# Patient Record
Sex: Female | Born: 1945 | Race: Black or African American | Hispanic: No | Smoking: Never smoker
Health system: Southern US, Community
[De-identification: ages and names within clinical notes are randomized; demographics above are authoritative.]

## PROBLEM LIST (undated history)

## (undated) DIAGNOSIS — R519 Headache, unspecified: Secondary | ICD-10-CM

## (undated) DIAGNOSIS — E871 Hypo-osmolality and hyponatremia: Secondary | ICD-10-CM

## (undated) DIAGNOSIS — R5381 Other malaise: Secondary | ICD-10-CM

## (undated) DIAGNOSIS — Z992 Dependence on renal dialysis: Secondary | ICD-10-CM

## (undated) DIAGNOSIS — K8020 Calculus of gallbladder without cholecystitis without obstruction: Secondary | ICD-10-CM

## (undated) DIAGNOSIS — E559 Vitamin D deficiency, unspecified: Secondary | ICD-10-CM

## (undated) DIAGNOSIS — M899 Disorder of bone, unspecified: Secondary | ICD-10-CM

## (undated) DIAGNOSIS — M949 Disorder of cartilage, unspecified: Secondary | ICD-10-CM

## (undated) DIAGNOSIS — R51 Headache: Secondary | ICD-10-CM

## (undated) DIAGNOSIS — E7220 Disorder of urea cycle metabolism, unspecified: Secondary | ICD-10-CM

## (undated) DIAGNOSIS — I10 Essential (primary) hypertension: Secondary | ICD-10-CM

## (undated) DIAGNOSIS — C801 Malignant (primary) neoplasm, unspecified: Secondary | ICD-10-CM

## (undated) DIAGNOSIS — F411 Generalized anxiety disorder: Secondary | ICD-10-CM

## (undated) DIAGNOSIS — N281 Cyst of kidney, acquired: Secondary | ICD-10-CM

## (undated) DIAGNOSIS — K7030 Alcoholic cirrhosis of liver without ascites: Secondary | ICD-10-CM

## (undated) DIAGNOSIS — R634 Abnormal weight loss: Secondary | ICD-10-CM

## (undated) DIAGNOSIS — E785 Hyperlipidemia, unspecified: Secondary | ICD-10-CM

## (undated) DIAGNOSIS — N186 End stage renal disease: Secondary | ICD-10-CM

## (undated) DIAGNOSIS — F1010 Alcohol abuse, uncomplicated: Secondary | ICD-10-CM

## (undated) DIAGNOSIS — M47812 Spondylosis without myelopathy or radiculopathy, cervical region: Secondary | ICD-10-CM

## (undated) DIAGNOSIS — R5383 Other fatigue: Secondary | ICD-10-CM

## (undated) DIAGNOSIS — F329 Major depressive disorder, single episode, unspecified: Secondary | ICD-10-CM

## (undated) HISTORY — DX: Alcoholic cirrhosis of liver without ascites: K70.30

## (undated) HISTORY — DX: Hypo-osmolality and hyponatremia: E87.1

## (undated) HISTORY — DX: Cyst of kidney, acquired: N28.1

## (undated) HISTORY — PX: ABDOMINAL HYSTERECTOMY: SHX81

## (undated) HISTORY — DX: Abnormal weight loss: R63.4

## (undated) HISTORY — DX: Essential (primary) hypertension: I10

## (undated) HISTORY — DX: Generalized anxiety disorder: F41.1

## (undated) HISTORY — DX: Disorder of urea cycle metabolism, unspecified: E72.20

## (undated) HISTORY — DX: Other fatigue: R53.83

## (undated) HISTORY — PX: OTHER SURGICAL HISTORY: SHX169

## (undated) HISTORY — DX: Hyperlipidemia, unspecified: E78.5

## (undated) HISTORY — DX: Vitamin D deficiency, unspecified: E55.9

## (undated) HISTORY — DX: Spondylosis without myelopathy or radiculopathy, cervical region: M47.812

## (undated) HISTORY — DX: Disorder of bone, unspecified: M89.9

## (undated) HISTORY — PX: APPENDECTOMY: SHX54

## (undated) HISTORY — PX: ESOPHAGOGASTRODUODENOSCOPY: SHX1529

## (undated) HISTORY — DX: Alcohol abuse, uncomplicated: F10.10

## (undated) HISTORY — DX: Other malaise: R53.81

## (undated) HISTORY — DX: Disorder of cartilage, unspecified: M94.9

## (undated) HISTORY — DX: Major depressive disorder, single episode, unspecified: F32.9

## (undated) HISTORY — PX: COLONOSCOPY: SHX174

## (undated) HISTORY — DX: Calculus of gallbladder without cholecystitis without obstruction: K80.20

---

## 1998-06-25 ENCOUNTER — Ambulatory Visit: Admission: RE | Admit: 1998-06-25 | Discharge: 1998-06-25 | Payer: Self-pay | Admitting: Obstetrics and Gynecology

## 1999-08-16 ENCOUNTER — Ambulatory Visit (HOSPITAL_COMMUNITY): Admission: RE | Admit: 1999-08-16 | Discharge: 1999-08-16 | Payer: Self-pay | Admitting: Obstetrics and Gynecology

## 1999-08-16 ENCOUNTER — Encounter: Payer: Self-pay | Admitting: Obstetrics and Gynecology

## 2000-08-22 ENCOUNTER — Encounter: Payer: Self-pay | Admitting: Obstetrics and Gynecology

## 2000-08-22 ENCOUNTER — Ambulatory Visit (HOSPITAL_COMMUNITY): Admission: RE | Admit: 2000-08-22 | Discharge: 2000-08-22 | Payer: Self-pay | Admitting: Obstetrics and Gynecology

## 2000-08-22 ENCOUNTER — Other Ambulatory Visit: Admission: RE | Admit: 2000-08-22 | Discharge: 2000-08-22 | Payer: Self-pay | Admitting: Obstetrics & Gynecology

## 2000-09-03 ENCOUNTER — Encounter: Payer: Self-pay | Admitting: Obstetrics and Gynecology

## 2000-09-03 ENCOUNTER — Encounter: Admission: RE | Admit: 2000-09-03 | Discharge: 2000-09-03 | Payer: Self-pay | Admitting: Obstetrics and Gynecology

## 2001-08-26 ENCOUNTER — Encounter: Payer: Self-pay | Admitting: Obstetrics and Gynecology

## 2001-08-26 ENCOUNTER — Ambulatory Visit (HOSPITAL_COMMUNITY): Admission: RE | Admit: 2001-08-26 | Discharge: 2001-08-26 | Payer: Self-pay | Admitting: Obstetrics and Gynecology

## 2002-08-27 ENCOUNTER — Encounter: Payer: Self-pay | Admitting: Internal Medicine

## 2002-08-27 ENCOUNTER — Encounter: Admission: RE | Admit: 2002-08-27 | Discharge: 2002-08-27 | Payer: Self-pay | Admitting: Internal Medicine

## 2003-08-31 ENCOUNTER — Encounter: Admission: RE | Admit: 2003-08-31 | Discharge: 2003-08-31 | Payer: Self-pay | Admitting: Internal Medicine

## 2003-08-31 ENCOUNTER — Encounter: Payer: Self-pay | Admitting: Internal Medicine

## 2004-08-31 ENCOUNTER — Encounter: Admission: RE | Admit: 2004-08-31 | Discharge: 2004-08-31 | Payer: Self-pay | Admitting: Internal Medicine

## 2005-09-15 ENCOUNTER — Encounter: Admission: RE | Admit: 2005-09-15 | Discharge: 2005-09-15 | Payer: Self-pay | Admitting: Internal Medicine

## 2006-09-17 ENCOUNTER — Ambulatory Visit (HOSPITAL_COMMUNITY): Admission: RE | Admit: 2006-09-17 | Discharge: 2006-09-17 | Payer: Self-pay | Admitting: Internal Medicine

## 2007-08-07 ENCOUNTER — Inpatient Hospital Stay (HOSPITAL_COMMUNITY): Admission: EM | Admit: 2007-08-07 | Discharge: 2007-08-09 | Payer: Self-pay | Admitting: Emergency Medicine

## 2007-08-08 ENCOUNTER — Encounter (INDEPENDENT_AMBULATORY_CARE_PROVIDER_SITE_OTHER): Payer: Self-pay | Admitting: *Deleted

## 2008-02-03 ENCOUNTER — Inpatient Hospital Stay (HOSPITAL_COMMUNITY): Admission: EM | Admit: 2008-02-03 | Discharge: 2008-02-05 | Payer: Self-pay | Admitting: Emergency Medicine

## 2008-02-03 ENCOUNTER — Ambulatory Visit: Payer: Self-pay | Admitting: Cardiovascular Disease

## 2008-02-04 ENCOUNTER — Encounter (INDEPENDENT_AMBULATORY_CARE_PROVIDER_SITE_OTHER): Payer: Self-pay | Admitting: Internal Medicine

## 2008-02-06 ENCOUNTER — Ambulatory Visit: Payer: Self-pay | Admitting: Internal Medicine

## 2008-11-19 ENCOUNTER — Ambulatory Visit (HOSPITAL_COMMUNITY): Admission: RE | Admit: 2008-11-19 | Discharge: 2008-11-19 | Payer: Self-pay | Admitting: Internal Medicine

## 2009-11-23 ENCOUNTER — Ambulatory Visit (HOSPITAL_COMMUNITY): Admission: RE | Admit: 2009-11-23 | Discharge: 2009-11-23 | Payer: Self-pay | Admitting: Internal Medicine

## 2009-12-22 ENCOUNTER — Ambulatory Visit: Payer: Self-pay | Admitting: Internal Medicine

## 2009-12-22 DIAGNOSIS — M899 Disorder of bone, unspecified: Secondary | ICD-10-CM

## 2009-12-22 DIAGNOSIS — F411 Generalized anxiety disorder: Secondary | ICD-10-CM | POA: Insufficient documentation

## 2009-12-22 DIAGNOSIS — F329 Major depressive disorder, single episode, unspecified: Secondary | ICD-10-CM | POA: Insufficient documentation

## 2009-12-22 DIAGNOSIS — E785 Hyperlipidemia, unspecified: Secondary | ICD-10-CM

## 2009-12-22 DIAGNOSIS — R82998 Other abnormal findings in urine: Secondary | ICD-10-CM | POA: Insufficient documentation

## 2009-12-22 DIAGNOSIS — F418 Other specified anxiety disorders: Secondary | ICD-10-CM

## 2009-12-22 DIAGNOSIS — K8020 Calculus of gallbladder without cholecystitis without obstruction: Secondary | ICD-10-CM | POA: Insufficient documentation

## 2009-12-22 DIAGNOSIS — N281 Cyst of kidney, acquired: Secondary | ICD-10-CM | POA: Insufficient documentation

## 2009-12-22 DIAGNOSIS — M810 Age-related osteoporosis without current pathological fracture: Secondary | ICD-10-CM | POA: Insufficient documentation

## 2009-12-22 DIAGNOSIS — E871 Hypo-osmolality and hyponatremia: Secondary | ICD-10-CM

## 2009-12-22 DIAGNOSIS — I10 Essential (primary) hypertension: Secondary | ICD-10-CM | POA: Insufficient documentation

## 2009-12-22 HISTORY — DX: Essential (primary) hypertension: I10

## 2009-12-22 HISTORY — DX: Disorder of bone, unspecified: M89.9

## 2009-12-22 HISTORY — DX: Hypo-osmolality and hyponatremia: E87.1

## 2009-12-22 HISTORY — DX: Calculus of gallbladder without cholecystitis without obstruction: K80.20

## 2009-12-22 HISTORY — DX: Hyperlipidemia, unspecified: E78.5

## 2009-12-22 HISTORY — DX: Other specified anxiety disorders: F41.8

## 2009-12-22 HISTORY — DX: Cyst of kidney, acquired: N28.1

## 2009-12-27 ENCOUNTER — Encounter: Payer: Self-pay | Admitting: Internal Medicine

## 2009-12-27 ENCOUNTER — Encounter: Admission: RE | Admit: 2009-12-27 | Discharge: 2009-12-27 | Payer: Self-pay | Admitting: Internal Medicine

## 2009-12-30 ENCOUNTER — Encounter: Payer: Self-pay | Admitting: Internal Medicine

## 2009-12-30 ENCOUNTER — Ambulatory Visit: Payer: Self-pay | Admitting: Internal Medicine

## 2010-04-14 ENCOUNTER — Telehealth: Payer: Self-pay | Admitting: Internal Medicine

## 2010-06-05 ENCOUNTER — Inpatient Hospital Stay (HOSPITAL_COMMUNITY): Admission: EM | Admit: 2010-06-05 | Discharge: 2010-06-10 | Payer: Self-pay | Admitting: Emergency Medicine

## 2010-06-10 ENCOUNTER — Encounter: Payer: Self-pay | Admitting: Internal Medicine

## 2010-06-13 ENCOUNTER — Encounter: Payer: Self-pay | Admitting: Internal Medicine

## 2010-06-30 ENCOUNTER — Encounter: Payer: Self-pay | Admitting: Internal Medicine

## 2010-07-11 ENCOUNTER — Telehealth: Payer: Self-pay | Admitting: Internal Medicine

## 2010-07-13 ENCOUNTER — Ambulatory Visit: Payer: Self-pay | Admitting: Internal Medicine

## 2010-07-13 ENCOUNTER — Telehealth: Payer: Self-pay | Admitting: Internal Medicine

## 2010-07-13 DIAGNOSIS — E7220 Disorder of urea cycle metabolism, unspecified: Secondary | ICD-10-CM | POA: Insufficient documentation

## 2010-07-13 DIAGNOSIS — D61818 Other pancytopenia: Secondary | ICD-10-CM

## 2010-07-13 DIAGNOSIS — F1010 Alcohol abuse, uncomplicated: Secondary | ICD-10-CM | POA: Insufficient documentation

## 2010-07-13 DIAGNOSIS — K7030 Alcoholic cirrhosis of liver without ascites: Secondary | ICD-10-CM

## 2010-07-13 HISTORY — DX: Alcoholic cirrhosis of liver without ascites: K70.30

## 2010-07-13 HISTORY — DX: Disorder of urea cycle metabolism, unspecified: E72.20

## 2010-07-13 HISTORY — DX: Alcohol abuse, uncomplicated: F10.10

## 2010-07-14 LAB — CONVERTED CEMR LAB
ALT: 24 units/L (ref 0–35)
Albumin: 3.1 g/dL — ABNORMAL LOW (ref 3.5–5.2)
Basophils Absolute: 0 10*3/uL (ref 0.0–0.1)
Basophils Relative: 0.9 % (ref 0.0–3.0)
CO2: 21 meq/L (ref 19–32)
Chloride: 98 meq/L (ref 96–112)
Creatinine, Ser: 0.8 mg/dL (ref 0.4–1.2)
GFR calc non Af Amer: 91.06 mL/min (ref >60)
HCT: 37 % (ref 36.0–46.0)
Hemoglobin: 12.9 g/dL (ref 12.0–15.0)
Lymphocytes Relative: 17.9 % (ref 12.0–46.0)
MCV: 101.8 fL — ABNORMAL HIGH (ref 78.0–100.0)
Monocytes Absolute: 0.3 10*3/uL (ref 0.1–1.0)
Monocytes Relative: 13.5 % — ABNORMAL HIGH (ref 3.0–12.0)
Neutrophils Relative %: 63.3 % (ref 43.0–77.0)
Platelets: 96 10*3/uL — ABNORMAL LOW (ref 150.0–400.0)
Total Bilirubin: 0.8 mg/dL (ref 0.3–1.2)
WBC: 2.6 10*3/uL — ABNORMAL LOW (ref 4.5–10.5)

## 2010-07-18 ENCOUNTER — Telehealth: Payer: Self-pay | Admitting: Internal Medicine

## 2010-11-24 ENCOUNTER — Ambulatory Visit (HOSPITAL_COMMUNITY)
Admission: RE | Admit: 2010-11-24 | Discharge: 2010-11-24 | Payer: Self-pay | Source: Home / Self Care | Attending: Internal Medicine | Admitting: Internal Medicine

## 2010-12-23 ENCOUNTER — Other Ambulatory Visit: Payer: Self-pay | Admitting: Internal Medicine

## 2010-12-23 ENCOUNTER — Encounter: Payer: Self-pay | Admitting: Internal Medicine

## 2010-12-23 ENCOUNTER — Ambulatory Visit
Admission: RE | Admit: 2010-12-23 | Discharge: 2010-12-23 | Payer: Self-pay | Source: Home / Self Care | Attending: Internal Medicine | Admitting: Internal Medicine

## 2010-12-23 DIAGNOSIS — E559 Vitamin D deficiency, unspecified: Secondary | ICD-10-CM | POA: Insufficient documentation

## 2010-12-23 DIAGNOSIS — R5383 Other fatigue: Secondary | ICD-10-CM

## 2010-12-23 DIAGNOSIS — R634 Abnormal weight loss: Secondary | ICD-10-CM | POA: Insufficient documentation

## 2010-12-23 DIAGNOSIS — R5381 Other malaise: Secondary | ICD-10-CM | POA: Insufficient documentation

## 2010-12-23 HISTORY — DX: Vitamin D deficiency, unspecified: E55.9

## 2010-12-23 HISTORY — DX: Abnormal weight loss: R63.4

## 2010-12-23 LAB — BASIC METABOLIC PANEL
BUN: 22 mg/dL (ref 6–23)
CO2: 26 mEq/L (ref 19–32)
Calcium: 10.3 mg/dL (ref 8.4–10.5)
Chloride: 102 mEq/L (ref 96–112)
Creatinine, Ser: 1.3 mg/dL — ABNORMAL HIGH (ref 0.4–1.2)
GFR: 53.63 mL/min — ABNORMAL LOW (ref >60.00)
Glucose, Bld: 94 mg/dL (ref 70–99)
Potassium: 6 mEq/L — ABNORMAL HIGH (ref 3.5–5.1)
Sodium: 133 mEq/L — ABNORMAL LOW (ref 135–145)

## 2010-12-23 LAB — CBC WITH DIFFERENTIAL/PLATELET
Basophils Absolute: 0 10*3/uL (ref 0.0–0.1)
Basophils Relative: 0.7 % (ref 0.0–3.0)
Eosinophils Absolute: 0.4 10*3/uL (ref 0.0–0.7)
Eosinophils Relative: 10.7 % — ABNORMAL HIGH (ref 0.0–5.0)
HCT: 33.7 % — ABNORMAL LOW (ref 36.0–46.0)
Hemoglobin: 11.5 g/dL — ABNORMAL LOW (ref 12.0–15.0)
Lymphocytes Relative: 21 % (ref 12.0–46.0)
Lymphs Abs: 0.8 10*3/uL (ref 0.7–4.0)
MCHC: 34.2 g/dL (ref 30.0–36.0)
MCV: 92.8 fl (ref 78.0–100.0)
Monocytes Absolute: 0.4 10*3/uL (ref 0.1–1.0)
Monocytes Relative: 10.7 % (ref 3.0–12.0)
Neutro Abs: 2.2 10*3/uL (ref 1.4–7.7)
Neutrophils Relative %: 56.9 % (ref 43.0–77.0)
Platelets: 117 10*3/uL — ABNORMAL LOW (ref 150.0–400.0)
RBC: 3.63 Mil/uL — ABNORMAL LOW (ref 3.87–5.11)
RDW: 13.5 % (ref 11.5–14.6)
WBC: 3.8 10*3/uL — ABNORMAL LOW (ref 4.5–10.5)

## 2010-12-23 LAB — HEPATIC FUNCTION PANEL
ALT: 15 U/L (ref 0–35)
AST: 24 U/L (ref 0–37)
Albumin: 3.5 g/dL (ref 3.5–5.2)
Alkaline Phosphatase: 45 U/L (ref 39–117)
Bilirubin, Direct: 0.1 mg/dL (ref 0.0–0.3)
Total Bilirubin: 0.6 mg/dL (ref 0.3–1.2)
Total Protein: 7.6 g/dL (ref 6.0–8.3)

## 2010-12-23 LAB — LIPID PANEL
Cholesterol: 147 mg/dL (ref 0–200)
HDL: 66.3 mg/dL (ref >39.00)
LDL Cholesterol: 60 mg/dL (ref 0–99)
Total CHOL/HDL Ratio: 2
Triglycerides: 103 mg/dL (ref 0.0–149.0)
VLDL: 20.6 mg/dL (ref 0.0–40.0)

## 2010-12-23 LAB — TSH: TSH: 1.28 u[IU]/mL (ref 0.35–5.50)

## 2011-01-15 LAB — CONVERTED CEMR LAB
ALT: 35 units/L (ref 0–35)
AST: 85 units/L — ABNORMAL HIGH (ref 0–37)
Albumin: 3 g/dL — ABNORMAL LOW (ref 3.5–5.2)
Alkaline Phosphatase: 61 units/L (ref 39–117)
Basophils Absolute: 0 10*3/uL (ref 0.0–0.1)
Basophils Relative: 0 % (ref 0.0–3.0)
Bilirubin Urine: NEGATIVE
Bilirubin, Direct: 0.5 mg/dL — ABNORMAL HIGH (ref 0.0–0.3)
CO2: 27 meq/L (ref 19–32)
Calcium: 9.4 mg/dL (ref 8.4–10.5)
Cholesterol: 169 mg/dL (ref 0–200)
Eosinophils Absolute: 0.3 10*3/uL (ref 0.0–0.7)
HDL: 77.9 mg/dL (ref >39.00)
Lymphocytes Relative: 29.2 % (ref 12.0–46.0)
MCHC: 32.8 g/dL (ref 30.0–36.0)
Neutro Abs: 4.5 10*3/uL (ref 1.4–7.7)
Nitrite: NEGATIVE
Pap Smear: NORMAL
Specific Gravity, Urine: <=1.005 (ref 1.000–1.030)
TSH: 0.83 microintl units/mL (ref 0.35–5.50)
Total Bilirubin: 1.5 mg/dL — ABNORMAL HIGH (ref 0.3–1.2)
Total CHOL/HDL Ratio: 2
Triglycerides: 97 mg/dL (ref 0.0–149.0)
WBC: 7.7 10*3/uL (ref 4.5–10.5)
pH: 7 (ref 5.0–8.0)

## 2011-01-17 NOTE — Assessment & Plan Note (Signed)
Summary: NEW MEDICARE PT--PKG--STC   Vital Signs:  Patient profile:   65 year old female Height:      63 inches Weight:      105 pounds BMI:     18.67 O2 Sat:      97 % on Room air Temp:     97.2 degrees F oral Pulse rate:   86 / minute BP sitting:   148 / 90  (left arm) Cuff size:   regular  Vitals Entered ByShirlean Mylar Ewing (December 22, 2009 10:00 AM)  O2 Flow:  Room air  Preventive Care Screening  Mammogram:    Next Due:  11/2010  Pap Smear:    Date:  11/26/2009    Results:  normal   Colonoscopy:    Date:  12/19/2007    Results:  normal prt pt per Dr Lorrin Goodell Density:    Date:  08/27/2002    Results:  abnormal std dev     decliens flu shot, tetanus and pneumovax  CC: adult physical,new pt/Re   CC:  adult physical and new pt/Re.  History of Present Illness: here to establish;  just started with medicare after no insurance and lost to f/u since approx feb 2009 after last hospn;  states BP at [facility] < 140/90 usually,  Pt denies CP, sob, doe, wheezing, orthopnea, pnd, worsening LE edema, palps, dizziness or syncope  Pt denies new neuro symptoms such as headache, facial or extremity weakness   Preventive Screening-Counseling & Management  Alcohol-Tobacco     Smoking Status: never  Problems Prior to Update: 1)  Other Nonspecific Finding Examination of Urine  (ICD-791.9) 2)  Depression  (ICD-311) 3)  Anxiety  (ICD-300.00) 4)  Osteopenia  (ICD-733.90) 5)  Renal Cyst, Left  (ICD-593.2) 6)  Gallstones  (ICD-574.20) 7)  Hyponatremia  (ICD-276.1) 8)  Hyperlipidemia  (ICD-272.4) 9)  Hypertension  (ICD-401.9) 10)  Preventive Health Care  (ICD-V70.0)  Medications Prior to Update: 1)  None  Current Medications (verified): 1)  Hydrochlorothiazide 25 Mg Tabs (Hydrochlorothiazide) .Marland Kitchen.. 1 By Mouth Once Daily 2)  Caltrate 600+d 600-400 Mg-Unit Tabs (Calcium Carbonate-Vitamin D) .Marland Kitchen.. 1 By Mouth Once Daily 3)  Alprazolam 0.5 Mg Tabs (Alprazolam) .Marland Kitchen.. 1 By Mouth Once  Daily 4)  Multivitamins  Tabs (Multiple Vitamin) .Marland Kitchen.. 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 breast cancer - sister  Social History: Reviewed history and no changes required. live independent - [place] Alcohol use-yes Never Smoked 4 children widow since 1999 retired - part time cookSmoking Status:  never  Review of Systems  The patient denies anorexia, fever, weight loss, weight gain, vision loss, decreased hearing, hoarseness, chest pain, syncope, dyspnea on exertion, peripheral edema, prolonged cough, headaches, hemoptysis, abdominal pain, melena, hematochezia, severe indigestion/heartburn, hematuria, incontinence, muscle weakness, suspicious skin lesions, transient blindness, difficulty walking, unusual weight change, abnormal bleeding, enlarged lymph  nodes, and angioedema.         all otherwise negative per pt - 12 system review done ; states she is depressed but declines ssri trial  Physical Exam  General:  alert and underweight appearing.   Head:  normocephalic and atraumatic.   Eyes:  vision grossly intact, pupils equal, and pupils round.   Ears:  R ear normal and L ear normal.   Nose:  no external deformity and no nasal discharge.     Mouth:  no gingival abnormalities and pharynx pink and moist.   Neck:  supple and no masses.   Lungs:  normal respiratory effort and normal breath sounds.   Heart:  normal rate and regular rhythm.   Abdomen:  soft, non-tender, and normal bowel sounds.   Msk:  no joint tenderness and no joint swelling.   Extremities:  no edema, no erythema  Neurologic:  cranial nerves II-XII intact and strength normal in all extremities.     Impression & Recommendations:  Problem # 1:  Preventive Health Care (ICD-V70.0) Overall doing well, up to date, counseled on routine health concerns for screening and prevention, immunizations up to date or declined, labs reviewed per echart and new labs ordered , ecg reviewed  Orders: EKG w/ Interpretation (93000) TLB-BMP (Basic Metabolic Panel-BMET) (99991111) TLB-CBC Platelet - w/Differential (85025-CBCD) TLB-Hepatic/Liver Function Pnl (80076-HEPATIC) TLB-Lipid Panel (80061-LIPID) TLB-TSH (Thyroid Stimulating Hormone) (84443-TSH) TLB-Udip ONLY (81003-UDIP) T-Vitamin D (25-Hydroxy) [id number])  Problem # 2:  HYPERTENSION (ICD-401.9)  Her updated medication list for this problem includes:    Hydrochlorothiazide 25 Mg Tabs (Hydrochlorothiazide) .Marland Kitchen... 1 by mouth once daily stable overall by hx and exam, ok to continue meds/tx as is   BP today: 148/90  Problem # 3:  ANXIETY (ICD-300.00)  Her updated medication list for this problem includes:    Alprazolam 0.5 Mg Tabs (Alprazolam) .Marland Kitchen... 1 by mouth once daily stable overall by hx and exam, ok to continue meds/tx as is, declines ssri trial for depressive symtpoms, no suicidal ideation  Problem # 4:  OSTEOPENIA (ICD-733.90)  due for f/u dxa - will order  Orders: T-Bone Densitometry [id number])  Problem # 5:  RENAL CYST, LEFT (ICD-593.2)  previous complex - for f/u u/s  Orders: Radiology Referral (Radiology)  Problem # 6:  OTHER NONSPECIFIC FINDING EXAMINATION OF URINE ([id number])  with prio  mild elev metanephrines per echart - to repeat  Orders: T-Urine 24 Hr. Catecholamines [phone]) T-Urine 24 Hr. Metanephrines [phone])  Complete Medication List: 1)  Hydrochlorothiazide 25 Mg Tabs (Hydrochlorothiazide) .Marland Kitchen.. 1 by mouth once daily 2)  Caltrate 600+d 600-400 Mg-unit Tabs (Calcium carbonate-vitamin d) .Marland Kitchen.. 1 by mouth once daily 3)  Alprazolam 0.5 Mg Tabs (Alprazolam) .Marland Kitchen.. 1 by mouth once daily 4)  Multivitamins Tabs (Multiple vitamin) .Marland Kitchen.. 1 by mouth once daily  Patient Instructions: 1)  your EKG was good today 2)  Please go to the Lab in the basement for your blood and/or urine tests today 3)  please schedule the bone density test before leaving today 4)  You will be contacted about the referral(s) to: kidney ultrasound 5)  Please schedule a follow-up appointment in 6 months. Prescriptions: ALPRAZOLAM 0.5 MG TABS (ALPRAZOLAM) 1 by mouth once daily  #30 x 5   Entered and Authorized by:   Biagio Borg MD   Signed by:   Biagio Borg MD on 12/22/2009   Method used:   Print then Give to Patient   RxID:   [id number] HYDROCHLOROTHIAZIDE 25 MG  TABS (HYDROCHLOROTHIAZIDE) 1 by mouth once daily  #90 x 3   Entered and Authorized by:   Biagio Borg MD   Signed by:   Biagio Borg MD on 12/22/2009   Method used:   Electronically to        [facility] (retail)       [address]       [place], [place]  [address]       Ph: [phone]       Fax: [phone]   RxID:   [id number]   Appended Document: NEW MEDICARE PT--PKG--STC 1.   Depression screening  - by history pt denies significant depressive symptoms 2.   Address functional ability - overall can do all ADL's 3.   Discuss advance directive - pt wants full code 4.   Address risk factors related with patient's lifestyle, age, and sex and referral patient for appropriate screening exam. - done at A/P section

## 2011-01-17 NOTE — Miscellaneous (Signed)
Summary: BONE DENSITY  Clinical Lists Changes  Orders: Added new Test order of T-Lumbar Vertebral Assessment (77082) - Signed 

## 2011-01-17 NOTE — Letter (Signed)
Summary: [facility]   Imported By: Phillis Knack 07/14/2010 11:23:38  _____________________________________________________________________  External Attachment:    Type:   Image     Comment:   External Document

## 2011-01-17 NOTE — Progress Notes (Signed)
Summary: ALT med  Phone Note Call from Patient   Caller: Daughter [name] [phone] Summary of Call: Pt's daughter called stating that pt's medication, Xifaxan is too expensive. Pt is requesting alternate medication to [facility] Initial call taken by: Crissie Sickles, [place],  July 18, 2010 12:02 PM  Follow-up for Phone Call        there is no other good alternative,  ok to cont the generic lactulose as she has Follow-up by: Biagio Borg MD,  July 18, 2010 1:06 PM  Additional Follow-up for Phone Call Additional follow up Details #1::        Pt's daughter informed Additional Follow-up by: Crissie Sickles, CMA,  July 18, 2010 3:55 PM

## 2011-01-17 NOTE — Assessment & Plan Note (Signed)
Summary: 2 wk f/u / # / cd   Vital Signs:  Patient profile:   65 year old female Height:      64 inches Weight:      109.50 pounds BMI:     18.86 O2 Sat:      97 % on Room air Temp:     97.7 degrees F oral Pulse rate:   78 / minute BP sitting:   110 / 70  (left arm) Cuff size:   regular  Vitals Entered By: Shirlean Mylar Ewing CMA Deborra Medina) (July 13, 2010 9:06 AM)  O2 Flow:  Room air  Preventive Care Screening  Last Tetanus Booster:    Date:  05/18/2010    Results:  Tdap   CC: 2 week followup/RE   CC:  2 week followup/RE.  History of Present Illness: here to f/u;  seen initlaly jan 2011 to establish with finidn of low K replaced oral, as well as osteopenia and low vitd ;  since then     alert, oriented to name, "doctor's office",  2011, june 27,  obama as president;  overall god compliance and good tolerance;  no pain  Pt denies CP, sob, doe, wheezing, orthopnea, pnd, worsening LE edema, palps, dizziness or syncope  Pt denies new neuro symptoms such as headache, facial or extremity weakness  No fever, wt loss, night sweats, loss of appetite or other constitutional symptoms  Denies GI symptms such as swelloing, n/v; does have loose stools with the lactulose;  no blood;  no GU symptoms, rash or joint pains;  no falls;  has PT at home, last sun walked outside a bit, not wllaking further with cane or other; no bruising or bleeding;  does have worsening depressive symptoms , no suicidal ideation, no increased anxiety or panic.    she does find taking the lactulose 30 qid to be a burden  Problems Prior to Update: 1)  Other Nonspecific Finding Examination of Urine  (ICD-791.9) 2)  Depression  (ICD-311) 3)  Anxiety  (ICD-300.00) 4)  Osteopenia  (ICD-733.90) 5)  Renal Cyst, Left  (ICD-593.2) 6)  Gallstones  (ICD-574.20) 7)  Hyponatremia  (ICD-276.1) 8)  Hyperlipidemia  (ICD-272.4) 9)  Hypertension  (ICD-401.9) 10)  Preventive Health Care  (ICD-V70.0)  Medications Prior to Update: 1)   Hydrochlorothiazide 25 Mg Tabs (Hydrochlorothiazide) .Marland Kitchen.. 1 By Mouth Once Daily 2)  Caltrate 600+d 600-400 Mg-Unit Tabs (Calcium Carbonate-Vitamin D) .Marland Kitchen.. 1 By Mouth Once Daily 3)  Alprazolam 0.5 Mg Tabs (Alprazolam) .Marland Kitchen.. 1 By Mouth Once Daily 4)  Multivitamins  Tabs (Multiple Vitamin) .Marland Kitchen.. 1 By Mouth Once Daily 5)  Klor-Con 10 10 Meq Cr-Tabs (Potassium Chloride) .Marland Kitchen.. 1 By Mouth Once Daily 6)  Alendronate Sodium 70 Mg Tabs (Alendronate Sodium) .Marland Kitchen.. 1 By Mouth Q Wk 7)  Propranolol Hcl 10 Mg Tabs (Propranolol Hcl) .Marland Kitchen.. 1 By Mouth Two Times A Day 8)  Vitamin B-1 100 Mg Tabs (Thiamine Hcl) .Marland Kitchen.. 1 By Mouth Once Daily  Current Medications (verified): 1)  Caltrate 600+d 600-400 Mg-Unit Tabs (Calcium Carbonate-Vitamin D) .Marland Kitchen.. 1 By Mouth Once Daily 2)  Alprazolam 0.5 Mg Tabs (Alprazolam) .Marland Kitchen.. 1 By Mouth Once Daily 3)  Multivitamins  Tabs (Multiple Vitamin) .Marland Kitchen.. 1 By Mouth Once Daily 4)  Klor-Con 20 Meq Pack (Potassium Chloride) .Marland Kitchen.. 1po Once Daily 5)  Alendronate Sodium 70 Mg Tabs (Alendronate Sodium) .Marland Kitchen.. 1 By Mouth Q Wk 6)  Propranolol Hcl 10 Mg Tabs (Propranolol Hcl) .Marland Kitchen.. 1 By Mouth Two Times A Day  7)  Vitamin B-1 100 Mg Tabs (Thiamine Hcl) .Marland Kitchen.. 1 By Mouth Once Daily 8)  Citalopram Hydrobromide 10 Mg Tabs (Citalopram Hydrobromide) .Marland Kitchen.. 1po Once Daily 9)  Generlac 10 Gm/62ml Soln (Lactulose Encephalopathy) .... 60 Cc By Mouth Two Times A Day 10)  Folic Acid 1 Mg Tabs (Folic Acid) .Marland Kitchen.. 1 By Mouth Once Daily 11)  Vitamin D 1000 Unit Tabs (Cholecalciferol) .Marland Kitchen.. 1 By Mouth Once Daily 12)  Spironolactone 100 Mg Tabs (Spironolactone) .Marland Kitchen.. 1 By Mouth Once Daily 13)  Clonidine Hcl 0.2 Mg/24hr Ptwk (Clonidine Hcl) .Marland Kitchen.. 1 Patch Qwk On Monday 14)  Xifaxan 550 Mg Tabs (Rifaximin) .Marland Kitchen.. 1 By Mouth Two Times A Day  Allergies (verified): No Known Drug Allergies  Past History:  Past Surgical History: Last updated: 12/22/2009 Denies surgical history  Social History: Last updated: 07/13/2010 live  independent - [place] Alcohol use-yes Never Smoked 4 children widow since 1999 retired - part time Training and development officer lives with son who does not drink ETOH  Risk Factors: Smoking Status: never (12/22/2009)  Past Medical History: recurrent syncope episode feb 2009 - dr Caryl Comes Hypertension Hyperlipidemia hyponatremia due to diuretic feb 2009 c-spine DJD gallstones renal cyst , left fatty liver Osteopenia elevated metanephrines urine feb 2009 Anxiety Depression Alcohol dependence/withdrawal episode june 2011 hyperammonemia/hyponatremia due to diuretic in setting of chronic liver dz pancytopenia due to liver disease  Social History: Reviewed history from 12/22/2009 and no changes required. 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   Head:  normocephalic and atraumatic.   Eyes:  vision grossly intact, pupils equal, and pupils round.   Ears:  R ear normal and L ear normal.   Nose:  no external deformity and no nasal discharge.   Mouth:  no gingival abnormalities and pharynx pink and moist.   Neck:  supple and no masses.   Lungs:  normal respiratory effort and normal breath sounds.   Heart:  normal rate and regular rhythm.   Abdomen:  soft, non-tender, and normal bowel sounds.   Msk:  no joint tenderness and no joint swelling.   Extremities:  no edema, no erythema  Neurologic:  cranial nerves II-XII intact, strength normal in all extremities, and gait normal.   Skin:  color normal and no rashes.   Psych:  not anxious appearing and dysphoric affect.     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 1 by mouth once daily  Orders: TLB-CBC Platelet - w/Differential (85025-CBCD)  Problem # 4:  HYPERTENSION (ICD-401.9)  The following medications were removed from the medication list:    Hydrochlorothiazide 25 Mg Tabs (Hydrochlorothiazide) .Marland Kitchen... 1 by mouth once  daily Her updated medication list for this problem includes:    Propranolol Hcl 10 Mg Tabs (Propranolol hcl) .Marland Kitchen... 1 by mouth two times a day    Spironolactone 100 Mg Tabs (Spironolactone) .Marland Kitchen... 1 by mouth once daily    Clonidine Hcl 0.2 Mg/24hr Ptwk (Clonidine hcl) .Marland Kitchen... 1 patch qwk on monday  BP today: 110/70 Prior BP: 148/90 (12/22/2009)  Labs Reviewed: K+: 2.9 (12/22/2009) Creat: : 0.6 (12/22/2009)   Chol: 169 (12/22/2009)   HDL: 77.90 (12/22/2009)   LDL: 72 (12/22/2009)   TG: 97.0 (12/22/2009) stable overall by hx and exam, ok to continue meds/tx as is   Problem # 5:  DEPRESSION (ICD-311)  Her updated medication list for this problem includes:    Alprazolam 0.5 Mg Tabs (Alprazolam) .Marland Kitchen... 1 by mouth once daily    Citalopram Hydrobromide 10 Mg Tabs (Citalopram hydrobromide) .Marland Kitchen... 1po once daily treat as above, f/u any worsening signs or symptoms  - start the SSRI  Orders: TLB-TSH (Thyroid Stimulating Hormone) (84443-TSH)  Complete Medication List: 1)  Caltrate 600+d 600-400 Mg-unit Tabs (Calcium carbonate-vitamin d) .Marland Kitchen.. 1 by mouth once daily 2)  Alprazolam 0.5 Mg Tabs (Alprazolam) .Marland Kitchen.. 1 by mouth once daily 3)  Multivitamins Tabs (Multiple vitamin) .Marland Kitchen.. 1 by mouth once daily 4)  Klor-con  20 Meq Pack (Potassium chloride) .Marland Kitchen.. 1po once daily 5)  Alendronate Sodium 70 Mg Tabs (Alendronate sodium) .Marland Kitchen.. 1 by mouth q wk 6)  Propranolol Hcl 10 Mg Tabs (Propranolol hcl) .Marland Kitchen.. 1 by mouth two times a day 7)  Vitamin B-1 100 Mg Tabs (Thiamine hcl) .Marland Kitchen.. 1 by mouth once daily 8)  Citalopram Hydrobromide 10 Mg Tabs (Citalopram hydrobromide) .Marland Kitchen.. 1po once daily 9)  Generlac 10 Gm/74ml Soln (Lactulose encephalopathy) .... 60 cc by mouth two times a day 10)  Folic Acid 1 Mg Tabs (Folic acid) .Marland Kitchen.. 1 by mouth once daily 11)  Vitamin D 1000 Unit Tabs (Cholecalciferol) .Marland Kitchen.. 1 by mouth once daily 12)  Spironolactone 100 Mg Tabs (Spironolactone) .Marland Kitchen.. 1 by mouth once daily 13)  Clonidine Hcl 0.2 Mg/24hr Ptwk (Clonidine hcl) .Marland Kitchen.. 1 patch qwk on monday 14)  Xifaxan 550 Mg Tabs (Rifaximin) .Marland Kitchen.. 1 by mouth two times a day  Other Orders: TLB-BMP (Basic Metabolic Panel-BMET) (99991111)  Patient Instructions: 1)  you had the pneumonia shot today 2)  Please take all new medications as prescribed - the citalopram for depression 3)  Continue all previous medications as before this visit , except change the generlac to 60 cc two times a day  4)  Please go to the Lab in the basement for your blood and/or urine tests today  5)  Please schedule a follow-up appointment in Jan 2012 for the "yearly medicare exam" 6)  please also take Vit D 1000 units per day (OTC) Prescriptions: XIFAXAN 550 MG TABS (RIFAXIMIN) 1 by mouth two times a day  #180 x 3   Entered and Authorized by:   Biagio Borg MD   Signed by:   Biagio Borg MD on 07/13/2010   Method used:   Electronically to        [facility] (retail)       [address]       [place], [place]  [address]       Ph: [phone]       Fax: [phone]   RxID:   [id number] PROPRANOLOL HCL 10 MG TABS (PROPRANOLOL HCL) 1  by mouth two times a day  #180 x 3   Entered and Authorized by:   Biagio Borg MD   Signed by:   Biagio Borg MD on 07/13/2010   Method used:   Electronically to        [facility] (retail)       [address]       [place], [place]  [address]       Ph: [phone]       Fax: [phone]   RxID:   [id number] CLONIDINE HCL 0.2 MG/24HR PTWK (CLONIDINE HCL) 1 patch qwk on monday  #12 x 3   Entered and Authorized by:   Biagio Borg MD   Signed by:   Biagio Borg MD on 07/13/2010   Method used:   Electronically to        [facility] (retail)       [address]       [place], [place]  [address]       Ph: [phone]       Fax: [phone]   RxID:   [id number] VITAMIN B-1 100 MG TABS (THIAMINE HCL) 1 by mouth once daily  #90 x 3   Entered and Authorized by:   Biagio Borg MD   Signed by:   Biagio Borg MD on 07/13/2010   Method used:   Electronically to        [facility] (retail)       [address]       [place], [place]  [address]       Ph: [phone]       Fax: [phone]   [id number] SPIRONOLACTONE 100 MG TABS (SPIRONOLACTONE) 1 by mouth once daily  #90 x 3   Entered and Authorized by:   Biagio Borg MD   Signed by:   Biagio Borg MD on 07/13/2010   Method used:   Electronically to        [facility] (retail)       [address]       [place], [place]  [address]       Ph: [phone]       Fax: [phone]   RxID:   [id number] ALENDRONATE SODIUM 70 MG TABS (ALENDRONATE SODIUM) 1 by mouth q wk  #12 x 3   Entered and Authorized by:   Biagio Borg MD   Signed by:   Biagio Borg MD on 07/13/2010   Method used:   Electronically to        [facility] (retail)       [address]       [place],   [address]       Ph: [phone]       Fax: [phone]   RxID:   [id number] KLOR-CON 20 MEQ PACK (POTASSIUM CHLORIDE) 1po once daily  #90 x 3   Entered and Authorized by:   Biagio Borg  MD   Signed by:   Biagio Borg MD on 07/13/2010   Method used:   Electronically to        [facility] (  retail)       [address]       [place], [place]  [address]       Ph: [phone]       Fax: [phone]   RxID:   [id number] FOLIC ACID 1 MG TABS (FOLIC ACID) 1 by mouth once daily  #90 x 3   Entered and Authorized by:   Biagio Borg MD   Signed by:   Biagio Borg MD on 07/13/2010   Method used:   Electronically to        [facility] (retail)       [address]       [place], [place]  [address]       Ph: [phone]       Fax: [phone]   RxID:   [id number] ALPRAZOLAM 0.5 MG TABS (ALPRAZOLAM) 1 by mouth once daily  #30 x 5   Entered and Authorized by:   Biagio Borg MD   Signed by:   Biagio Borg MD on 07/13/2010   Method used:   Print then Give to Patient   RxID:   [id number] GENERLAC 10 GM/15ML SOLN (LACTULOSE ENCEPHALOPATHY) 60 cc by mouth two times a day  #29mosupply x 3   Entered and Authorized by:   Biagio Borg MD   Signed by:   Biagio Borg MD on 07/13/2010   Method used:   Electronically to        [facility] (retail)       [address]       [place], [place]  [address]       Ph: [phone]       Fax: [phone]   RxID:   [id number] CITALOPRAM HYDROBROMIDE 10 MG TABS (CITALOPRAM HYDROBROMIDE) 1po once daily  #90 x 3   Entered and Authorized by:   Biagio Borg MD   Signed by:   Biagio Borg MD on 07/13/2010   Method used:   Electronically to        [facility] (retail)       [address]       [place], [place]  [address]       Ph: [phone]       Fax: [phone]   RxID:   [id number]   Appended Document: Immunization Entry      Immunizations Administered:  Pneumonia Vaccine:    Vaccine Type: Pneumovax    Site: right deltoid    Mfr: Merck    Dose: 0.5 ml    Route: IM    Given by: Shirlean Mylar  Ewing CMA ([place])    Exp. Date: 12/17/2011    Lot #: [id number]    VIS given: 07/15/96 version given July 13, 2010.

## 2011-01-17 NOTE — Progress Notes (Signed)
Summary: Rx clarification  Phone Note From Pharmacy   [facility] Summary of Call: Pharmacy called requesting clarification on pt's Klor-Con. Pharmacy advised pt Rx was increased to 62meg and should be in tablet not packet.  Initial call taken by: Crissie Sickles, [place],  July 13, 2010 12:00 PM    New/Updated Medications: KLOR-CON M20 20 MEQ CR-TABS (POTASSIUM CHLORIDE CRYS CR) 1 by mouth qd Prescriptions: KLOR-CON M20 20 MEQ CR-TABS (POTASSIUM CHLORIDE CRYS CR) 1 by mouth qd  #30 x 10   Entered and Authorized by:   Crissie Sickles, CMA   Signed by:   Crissie Sickles, CMA on 07/13/2010   Method used:   Telephoned to ...       [facility] ([facility]       [address]       [place], [place]  [address]       Ph: [phone]       Fax: [phone]   RxID:   [id number]

## 2011-01-17 NOTE — Progress Notes (Signed)
  Phone Note From Other Clinic   Caller: [place] Summary of Call: [facility] called requesting prescription refills for pt. Vitamin B1 100mg  once daily, Klor Con 56meg once daily , and Propanolol 10mg  two times a day . Patient is scheduled to see Dr. Jenny Reichmann On wednesday July 13, 2010. Prescriptions can be sent to [facility]. Initial call taken by: Robin Ewing CMA Deborra Medina),  July 11, 2010 3:30 PM  Follow-up for Phone Call        ok for refills - to robin to handle Follow-up by: Biagio Borg MD,  July 11, 2010 5:12 PM    New/Updated Medications: PROPRANOLOL HCL 10 MG TABS (PROPRANOLOL HCL) 1 by mouth two times a day VITAMIN B-1 100 MG TABS (THIAMINE HCL) 1 by mouth once daily Prescriptions: VITAMIN B-1 100 MG TABS (THIAMINE HCL) 1 by mouth once daily  #30 x 6   Entered by:   Shirlean Mylar Ewing CMA ([place])   Authorized by:   Biagio Borg MD   Signed by:   Sharon Seller CMA ([place]) on 07/12/2010   Method used:   Faxed to ...       [facility] ([facility]       [place], [place]  [address]       Ph: [phone]       Fax: [phone]   RxID:   [id number] PROPRANOLOL HCL 10 MG TABS (PROPRANOLOL HCL) 1 by mouth two times a day  #60 x 6   Entered by:   Sharon Seller CMA ([place])   Authorized by:   Biagio Borg MD   Signed by:   Sharon Seller CMA ([place]) on 07/12/2010   Method used:   Faxed to ...       [facility]       [address]       [place], [place]  [address]       Ph: [phone]       Fax: [phone]   RxID:   [id number] KLOR-CON 10 10 MEQ CR-TABS (POTASSIUM CHLORIDE) 1 by mouth once daily  #90 x 3   Entered by:   Sharon Seller CMA ([place])   Authorized by:   Biagio Borg MD   Signed by:   Sharon Seller CMA ([place]) on 07/12/2010   Method used:   Faxed to ...       [facility]       [address]       [place],   [address]       Ph:  [phone]       Fax: [phone]   RxID:   [id number]

## 2011-01-17 NOTE — Letter (Signed)
Summary: [facility] Asal   Imported By: Phillis Knack 07/14/2010 11:20:46  _____________________________________________________________________  External Attachment:    Type:   Image     Comment:   External Document

## 2011-01-17 NOTE — Progress Notes (Signed)
  Phone Note From Pharmacy   Caller: [facility] Summary of Call: Pharmacy requested clarification on Alendronate. Prescription stated 1po once daily.  Pharmacy requesting to verify instructions. Initial call taken by: Sharon Seller,  April 14, 2010 2:21 PM  Follow-up for Phone Call        sorry - 1 q wk Follow-up by: Biagio Borg MD,  April 14, 2010 2:38 PM    New/Updated Medications: ALENDRONATE SODIUM 70 MG TABS (ALENDRONATE SODIUM) 1 by mouth q wk Prescriptions: ALENDRONATE SODIUM 70 MG TABS (ALENDRONATE SODIUM) 1 by mouth q wk  #12 x 3   Entered and Authorized by:   Biagio Borg MD   Signed by:   Biagio Borg MD on 04/14/2010   Method used:   Electronically to        [facility] (retail)       [address]       [place], [place]  [address]       Ph: [phone]       Fax: [phone]   RxID:   [id number]

## 2011-01-19 NOTE — Assessment & Plan Note (Signed)
Summary: YEARLY--JAN--STC   Vital Signs:  Patient profile:   65 year old female Height:      63 inches Weight:      98.8 pounds BMI:     17.56 O2 Sat:      93 % on Room air Temp:     98.3 degrees F oral Pulse rate:   94 / minute BP sitting:   110 / 70  (left arm) Cuff size:   regular  Vitals Entered By: Shirlean Mylar Ewing CMA Deborra Medina) (December 23, 2010 11:06 AM)  O2 Flow:  Room air  Preventive Care Screening  Mammogram:    Date:  10/18/2010    Next Due:  10/2011    Results:  normal      declines flu shot today  CC: Yearly/RE   CC:  Yearly/RE.  History of Present Illness: here to f/u;  could not take the xifaxin - too expensive;  o/w doing ok; here with friend,  no ETOH use since last withdrawal and hospn;   Pt denies CP, worsening sob, doe, wheezing, orthopnea, pnd, worsening LE edema, palps, dizziness or syncope  Pt denies new neuro symptoms such as headache, facial or extremity weakness  Pt denies polydipsia, polyuria   Overall good compliance with meds, trying to follow low chol  diet, wt stable, little excercise however .  No fever, wt loss, night sweats, loss of appetite or other constitutional symptoms  Overall good compliance with meds, and good tolerability.  Denies worsening depressive symptoms, suicidal ideation, or panic.   Does have signficiant diarrhea related to the lactulose.  No reflux, n/v, dysphagia, abd pain, bowel change or blood except for the above.   Has had some wt loss despite being underwt to start, several lbs.  No cough. Has ongoing mild fatigue, but denies OSA symtpoms or hyper -hypo thyroid symptoms such as voice, skin changes.    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 1 By Mouth Once Daily 2)  Alprazolam 0.5 Mg Tabs (Alprazolam) .Marland Kitchen.. 1 By Mouth Once Daily 3)  Multivitamins  Tabs (Multiple Vitamin) .Marland Kitchen.. 1 By Mouth Once Daily 4)  Klor-Con M20 20 Meq Cr-Tabs (Potassium Chloride Crys Cr) .Marland Kitchen.. 1 By Mouth Qd 5)  Alendronate Sodium 70 Mg Tabs (Alendronate Sodium) .Marland Kitchen.. 1 By Mouth Q Wk 6)  Propranolol Hcl 10 Mg Tabs (Propranolol Hcl) .Marland Kitchen.. 1 By Mouth Two Times A Day 7)  Vitamin B-1 100 Mg Tabs (Thiamine Hcl) .Marland Kitchen.. 1 By Mouth Once Daily 8)  Citalopram Hydrobromide 10 Mg Tabs (Citalopram Hydrobromide) .Marland Kitchen.. 1po Once Daily 9)  Generlac 10 Gm/45ml Soln (Lactulose Encephalopathy) .... 60 Cc By Mouth Two Times A Day 10)  Folic Acid 1 Mg Tabs (Folic Acid) .Marland Kitchen.. 1 By Mouth Once Daily 11)  Vitamin D 1000 Unit  Tabs (Cholecalciferol) .Marland Kitchen.. 1 By Mouth Once Daily 12)  Spironolactone 100 Mg Tabs (Spironolactone) .Marland Kitchen.. 1 By Mouth Once Daily 13)  Clonidine Hcl 0.2 Mg/24hr Ptwk (Clonidine Hcl) .Marland Kitchen.. 1 Patch Qwk On Monday 14)  Xifaxan 550 Mg Tabs (Rifaximin) .Marland Kitchen.. 1 By Mouth Two Times A Day  Current Medications (verified): 1)  Caltrate 600+d 600-400 Mg-Unit Tabs (Calcium Carbonate-Vitamin D) .Marland Kitchen.. 1 By Mouth Once Daily 2)  Alprazolam 0.5 Mg Tabs (Alprazolam) .Marland Kitchen.. 1 By Mouth Once Daily 3)  Multivitamins  Tabs (Multiple Vitamin) .Marland Kitchen.. 1 By Mouth Once Daily 4)  Alendronate Sodium 70 Mg Tabs (Alendronate Sodium) .Marland Kitchen.. 1 By Mouth Q Wk 5)  Propranolol Hcl 10 Mg Tabs (Propranolol Hcl) .Marland Kitchen.. 1 By Mouth Two Times A Day 6)  Vitamin B-1 100 Mg Tabs (Thiamine Hcl) .Marland Kitchen.. 1 By  Mouth Once Daily 7)  Citalopram Hydrobromide 10 Mg Tabs (Citalopram Hydrobromide) .Marland Kitchen.. 1po Once Daily 8)  Generlac 10 Gm/10ml Soln (Lactulose Encephalopathy) .... 60 Cc By Mouth Once Daily 9)  Folic Acid 1 Mg Tabs (Folic Acid) .Marland Kitchen.. 1 By Mouth Once Daily 10)  Vitamin D 1000 Unit Tabs (Cholecalciferol) .Marland Kitchen.. 1 By Mouth Once Daily 11)  Spironolactone 100 Mg Tabs (Spironolactone) .Marland Kitchen.. 1 By Mouth Once Daily 12)  Clonidine Hcl 0.2 Mg/24hr Ptwk (Clonidine Hcl) .Marland Kitchen.. 1 Patch Qwk On Monday  Allergies (verified): No Known Drug Allergies  Past History:  Past Surgical History: Last updated: 12/22/2009 Denies surgical history  Social History: Last updated: 07/13/2010 live independent - [place] Alcohol use-yes Never Smoked 4 children widow since 1999 retired - part time Training and development officer lives with son who does not drink ETOH  Risk Factors: Smoking Status: never (12/22/2009)  Past Medical History: recurrent syncope episode feb 2009 - dr Caryl Comes Hypertension Hyperlipidemia hyponatremia due to diuretic feb 2009 c-spine DJD gallstones renal cyst , left fatty liver Osteopenia elevated metanephrines urine feb 2009 Anxiety Depression Alcohol dependence/withdrawal episode june 2011 hyperammonemia/hyponatremia due to diuretic in setting of chronic liver dz pancytopenia due to liver disease vit d deficiency  Review of Systems       all otherwise negative per pt -    Physical Exam  General:  alert and underweight appearing.   Head:  normocephalic and atraumatic.   Eyes:  vision grossly intact, pupils equal, and pupils round.   Ears:  R ear normal and L ear normal.   Nose:  no external deformity and no nasal discharge.   Mouth:  no gingival abnormalities and pharynx pink and moist.   Neck:  supple and no masses.   Lungs:  normal respiratory effort and normal breath sounds.   Heart:  normal rate and regular rhythm.   Abdomen:  soft, non-tender, and normal bowel sounds.   Msk:  no joint tenderness  and no joint swelling.   Extremities:  no edema, no erythema  Neurologic:  not confused today, o/w gross motor normal Skin:  color normal and no rashes.   Psych:  not depressed appearing and slightly anxious.     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) ok to decr the lactulose to 600 cc per day(pt c/o diarrhea) - o/w stable overall by hx and exam, ok to continue meds/tx as is   Problem # 2:  WEIGHT LOSS (ICD-783.21) likely due to comorbidities, declines cxr, will do lab work though  Problem # 3:  FATIGUE (ICD-780.79) exam benign, to check labs below; follow with expectant management  Orders: TLB-BMP (Basic Metabolic Panel-BMET) (99991111)  TLB-CBC Platelet - w/Differential (85025-CBCD) TLB-Hepatic/Liver Function Pnl (80076-HEPATIC) TLB-TSH (Thyroid Stimulating Hormone) (84443-TSH)  Problem # 4:  PANCYTOPENIA (ICD-284.1)  Her updated medication list for this problem includes:    Folic Acid 1 Mg Tabs (Folic acid) .Marland Kitchen... 1 by mouth once daily most likely related to the ETOH - to re-check labs today  Hgb: 12.9 (07/13/2010)   Hct: 37.0 (07/13/2010)   Platelets: 96.0 (07/13/2010) RBC: 3.63 (07/13/2010)   RDW: 12.9 (07/13/2010)   WBC: 2.6 (07/13/2010) MCV: 101.8 (07/13/2010)   MCHC: 35.0 (07/13/2010) TSH: 1.27 (07/13/2010)  Complete Medication List: 1)  Caltrate 600+d 600-400 Mg-unit Tabs (Calcium carbonate-vitamin d) .Marland Kitchen.. 1 by mouth once daily 2)  Alprazolam 0.5 Mg Tabs (Alprazolam) .Marland Kitchen.. 1 by mouth once daily 3)  Multivitamins Tabs (Multiple vitamin) .Marland Kitchen.. 1 by mouth once daily 4)  Alendronate Sodium 70 Mg Tabs (Alendronate sodium) .Marland Kitchen.. 1 by mouth q wk 5)  Propranolol Hcl 10 Mg Tabs (Propranolol hcl) .Marland Kitchen.. 1 by mouth two times a day 6)  Vitamin B-1 100 Mg Tabs (Thiamine hcl) .Marland Kitchen.. 1 by mouth once daily 7)  Citalopram Hydrobromide 10 Mg Tabs (Citalopram hydrobromide) .Marland Kitchen.. 1po once daily 8)  Generlac 10 Gm/17ml Soln (Lactulose encephalopathy)  .... 60 cc by mouth once daily 9)  Folic Acid 1 Mg Tabs (Folic acid) .Marland Kitchen.. 1 by mouth once daily 10)  Vitamin D 1000 Unit Tabs (Cholecalciferol) .Marland Kitchen.. 1 by mouth once daily 11)  Spironolactone 100 Mg Tabs (Spironolactone) .Marland Kitchen.. 1 by mouth once daily 12)  Clonidine Hcl 0.2 Mg/24hr Ptwk (Clonidine hcl) .Marland Kitchen.. 1 patch qwk on monday  Other Orders: EKG w/ Interpretation (93000) TLB-Lipid Panel (80061-LIPID)  Patient Instructions: 1)  please call Dr Lorie Apley office to find out when next colonoscopy is due 2)  Please go to the Lab in the basement for your blood and/or urine tests today  3)  Please call the number on the [place] for results of your testing  4)  decrease the lactulose to 60 cc in the morning only 5)  Continue all previous medications as before this visit  6)  Please schedule a follow-up appointment in 6 months. Prescriptions: CLONIDINE HCL 0.2 MG/24HR PTWK (CLONIDINE HCL) 1 patch qwk on monday  #12 x 3   Entered and Authorized by:   Biagio Borg MD   Signed by:   Biagio Borg MD on 12/23/2010   Method used:   Print then Give to Patient   RxID:   [id number] SPIRONOLACTONE 100 MG TABS (SPIRONOLACTONE) 1 by mouth once daily  #90 x 3   Entered and Authorized by:   Biagio Borg MD   Signed by:   Biagio Borg MD on 12/23/2010   Method used:   Print then Give to Patient   RxID:   [id number] FOLIC ACID 1 MG TABS (FOLIC ACID) 1 by mouth once daily  #90 x 3   Entered and Authorized by:   Biagio Borg MD   Signed by:   Biagio Borg MD on 12/23/2010   Method used:   Print then Give to Patient   RxID:   [id number] [place] 10 GM/15ML SOLN (LACTULOSE ENCEPHALOPATHY) 60 cc by mouth once daily  #1 bottle x 5   Entered and Authorized by:   Biagio Borg MD   Signed by:   Biagio Borg MD on 12/23/2010   Method used:   Print then Give to Patient   RxID:   [id number] CITALOPRAM HYDROBROMIDE 10 MG  TABS (CITALOPRAM HYDROBROMIDE) 1po once daily  #90 x 3   Entered and  Authorized by:   Biagio Borg MD   Signed by:   Biagio Borg MD on 12/23/2010   Method used:   Print then Give to Patient   RxID:   [id number] PROPRANOLOL HCL 10 MG TABS (PROPRANOLOL HCL) 1 by mouth two times a day  #180 x 3   Entered and Authorized by:   Biagio Borg MD   Signed by:   Biagio Borg MD on 12/23/2010   Method used:   Print then Give to Patient   RxID:   [id number] ALENDRONATE SODIUM 70 MG TABS (ALENDRONATE SODIUM) 1 by mouth q wk  #12 x 3   Entered and Authorized by:   Biagio Borg MD   Signed by:   Biagio Borg MD on 12/23/2010   Method used:   Print then Give to Patient   RxID:   [id number] KLOR-CON M20 20 MEQ CR-TABS (POTASSIUM CHLORIDE CRYS CR) 1 by mouth once daily  #90 x 3   Entered and Authorized by:   Biagio Borg MD   Signed by:   Biagio Borg MD on 12/23/2010   Method used:   Print then Give to Patient   [id number] ALPRAZOLAM 0.5 MG TABS (ALPRAZOLAM) 1 by mouth once daily  #30 x 5   Entered and Authorized by:   Biagio Borg MD   Signed by:   Biagio Borg MD on 12/23/2010   Method used:   Print then Give to Patient   RxID:   [id number]    Orders Added: 1)  EKG w/ Interpretation [93000] 2)  TLB-BMP (Basic Metabolic Panel-BMET) 123456 3)  TLB-CBC Platelet - w/Differential [85025-CBCD] 4)  TLB-Hepatic/Liver Function Pnl [80076-HEPATIC] 5)  TLB-TSH (Thyroid Stimulating Hormone) [84443-TSH] 6)  TLB-Lipid Panel [80061-LIPID] 7)  Est. Patient Level IV [id number]

## 2011-03-05 LAB — CBC
HCT: 32 % — ABNORMAL LOW (ref 36.0–46.0)
HCT: 32.2 % — ABNORMAL LOW (ref 36.0–46.0)
HCT: 34.4 % — ABNORMAL LOW (ref 36.0–46.0)
HCT: 40.1 % (ref 36.0–46.0)
Hemoglobin: 11 g/dL — ABNORMAL LOW (ref 12.0–15.0)
Hemoglobin: 11.3 g/dL — ABNORMAL LOW (ref 12.0–15.0)
Hemoglobin: 12.1 g/dL (ref 12.0–15.0)
Hemoglobin: 15.4 g/dL — ABNORMAL HIGH (ref 12.0–15.0)
MCHC: 34.3 g/dL (ref 30.0–36.0)
MCHC: 34.5 g/dL (ref 30.0–36.0)
MCHC: 35.2 g/dL (ref 30.0–36.0)
MCHC: 38.5 g/dL — ABNORMAL HIGH (ref 30.0–36.0)
MCV: 106.8 fL — ABNORMAL HIGH (ref 78.0–100.0)
MCV: 108 fL — ABNORMAL HIGH (ref 78.0–100.0)
MCV: 108 fL — ABNORMAL HIGH (ref 78.0–100.0)
MCV: 109.4 fL — ABNORMAL HIGH (ref 78.0–100.0)
MCV: 109.8 fL — ABNORMAL HIGH (ref 78.0–100.0)
MCV: 109.9 fL — ABNORMAL HIGH (ref 78.0–100.0)
Platelets: 29 10*3/uL — CL (ref 150–400)
Platelets: 31 10*3/uL — ABNORMAL LOW (ref 150–400)
Platelets: 32 10*3/uL — ABNORMAL LOW (ref 150–400)
Platelets: 41 10*3/uL — ABNORMAL LOW (ref 150–400)
RBC: 2.91 MIL/uL — ABNORMAL LOW (ref 3.87–5.11)
RBC: 2.93 MIL/uL — ABNORMAL LOW (ref 3.87–5.11)
RBC: 3.06 MIL/uL — ABNORMAL LOW (ref 3.87–5.11)
RBC: 3.17 MIL/uL — ABNORMAL LOW (ref 3.87–5.11)
RBC: 3.22 MIL/uL — ABNORMAL LOW (ref 3.87–5.11)
RBC: 3.67 MIL/uL — ABNORMAL LOW (ref 3.87–5.11)
RDW: 15 % (ref 11.5–15.5)
RDW: 15.1 % (ref 11.5–15.5)
RDW: 15.5 % (ref 11.5–15.5)
WBC: 3.1 10*3/uL — ABNORMAL LOW (ref 4.0–10.5)
WBC: 3.1 10*3/uL — ABNORMAL LOW (ref 4.0–10.5)
WBC: 4.4 10*3/uL (ref 4.0–10.5)

## 2011-03-05 LAB — HEPATITIS PANEL, ACUTE
HCV Ab: NEGATIVE
Hep A IgM: NEGATIVE
Hep B C IgM: NEGATIVE
Hepatitis B Surface Ag: NEGATIVE

## 2011-03-05 LAB — COMPREHENSIVE METABOLIC PANEL
ALT: 48 U/L — ABNORMAL HIGH (ref 0–35)
ALT: 58 U/L — ABNORMAL HIGH (ref 0–35)
ALT: 61 U/L — ABNORMAL HIGH (ref 0–35)
ALT: 63 U/L — ABNORMAL HIGH (ref 0–35)
AST: 102 U/L — ABNORMAL HIGH (ref 0–37)
AST: 185 U/L — ABNORMAL HIGH (ref 0–37)
AST: 216 U/L — ABNORMAL HIGH (ref 0–37)
AST: 247 U/L — ABNORMAL HIGH (ref 0–37)
AST: 318 U/L — ABNORMAL HIGH (ref 0–37)
Albumin: 1.9 g/dL — ABNORMAL LOW (ref 3.5–5.2)
Albumin: 2 g/dL — ABNORMAL LOW (ref 3.5–5.2)
Albumin: 2 g/dL — ABNORMAL LOW (ref 3.5–5.2)
Albumin: 2.1 g/dL — ABNORMAL LOW (ref 3.5–5.2)
Albumin: 2.2 g/dL — ABNORMAL LOW (ref 3.5–5.2)
Albumin: 3 g/dL — ABNORMAL LOW (ref 3.5–5.2)
Alkaline Phosphatase: 59 U/L (ref 39–117)
Alkaline Phosphatase: 62 U/L (ref 39–117)
Alkaline Phosphatase: 68 U/L (ref 39–117)
Alkaline Phosphatase: 73 U/L (ref 39–117)
BUN: 4 mg/dL — ABNORMAL LOW (ref 6–23)
BUN: 5 mg/dL — ABNORMAL LOW (ref 6–23)
BUN: 5 mg/dL — ABNORMAL LOW (ref 6–23)
BUN: 5 mg/dL — ABNORMAL LOW (ref 6–23)
BUN: 7 mg/dL (ref 6–23)
CO2: 18 mEq/L — ABNORMAL LOW (ref 19–32)
CO2: 20 mEq/L (ref 19–32)
CO2: 21 mEq/L (ref 19–32)
CO2: 25 mEq/L (ref 19–32)
Calcium: 7.9 mg/dL — ABNORMAL LOW (ref 8.4–10.5)
Calcium: 8.1 mg/dL — ABNORMAL LOW (ref 8.4–10.5)
Calcium: 8.2 mg/dL — ABNORMAL LOW (ref 8.4–10.5)
Calcium: 8.2 mg/dL — ABNORMAL LOW (ref 8.4–10.5)
Chloride: 107 mEq/L (ref 96–112)
Chloride: 115 mEq/L — ABNORMAL HIGH (ref 96–112)
Chloride: 116 mEq/L — ABNORMAL HIGH (ref 96–112)
Chloride: 117 mEq/L — ABNORMAL HIGH (ref 96–112)
Chloride: 120 mEq/L — ABNORMAL HIGH (ref 96–112)
Creatinine, Ser: 0.64 mg/dL (ref 0.4–1.2)
Creatinine, Ser: 0.66 mg/dL (ref 0.4–1.2)
Creatinine, Ser: 0.66 mg/dL (ref 0.4–1.2)
Creatinine, Ser: 0.7 mg/dL (ref 0.4–1.2)
Creatinine, Ser: 0.74 mg/dL (ref 0.4–1.2)
Creatinine, Ser: 0.94 mg/dL (ref 0.4–1.2)
GFR calc Af Amer: >60 mL/min (ref >60)
GFR calc Af Amer: >60 mL/min (ref >60)
GFR calc Af Amer: >60 mL/min (ref >60)
GFR calc non Af Amer: >60 mL/min (ref >60)
GFR calc non Af Amer: >60 mL/min (ref >60)
GFR calc non Af Amer: >60 mL/min (ref >60)
GFR calc non Af Amer: >60 mL/min (ref >60)
GFR calc non Af Amer: >60 mL/min (ref >60)
Glucose, Bld: 103 mg/dL — ABNORMAL HIGH (ref 70–99)
Glucose, Bld: 117 mg/dL — ABNORMAL HIGH (ref 70–99)
Glucose, Bld: 78 mg/dL (ref 70–99)
Glucose, Bld: 84 mg/dL (ref 70–99)
Potassium: 2.2 mEq/L — CL (ref 3.5–5.1)
Potassium: 2.3 mEq/L — CL (ref 3.5–5.1)
Potassium: 3.1 mEq/L — ABNORMAL LOW (ref 3.5–5.1)
Potassium: 3.8 mEq/L (ref 3.5–5.1)
Potassium: 4.1 mEq/L (ref 3.5–5.1)
Sodium: 134 mEq/L — ABNORMAL LOW (ref 135–145)
Sodium: 138 mEq/L (ref 135–145)
Sodium: 139 mEq/L (ref 135–145)
Sodium: 139 mEq/L (ref 135–145)
Total Bilirubin: 1.6 mg/dL — ABNORMAL HIGH (ref 0.3–1.2)
Total Bilirubin: 2.2 mg/dL — ABNORMAL HIGH (ref 0.3–1.2)
Total Bilirubin: 2.7 mg/dL — ABNORMAL HIGH (ref 0.3–1.2)
Total Bilirubin: 2.8 mg/dL — ABNORMAL HIGH (ref 0.3–1.2)
Total Bilirubin: 2.9 mg/dL — ABNORMAL HIGH (ref 0.3–1.2)
Total Protein: 6.9 g/dL (ref 6.0–8.3)
Total Protein: 7 g/dL (ref 6.0–8.3)
Total Protein: 7.7 g/dL (ref 6.0–8.3)
Total Protein: 9.9 g/dL — ABNORMAL HIGH (ref 6.0–8.3)

## 2011-03-05 LAB — APTT: aPTT: 41 seconds — ABNORMAL HIGH (ref 24–37)

## 2011-03-05 LAB — BASIC METABOLIC PANEL
BUN: 3 mg/dL — ABNORMAL LOW (ref 6–23)
CO2: 21 mEq/L (ref 19–32)
Chloride: 116 mEq/L — ABNORMAL HIGH (ref 96–112)
Creatinine, Ser: 0.67 mg/dL (ref 0.4–1.2)
Glucose, Bld: 97 mg/dL (ref 70–99)
Potassium: 3.9 mEq/L (ref 3.5–5.1)

## 2011-03-05 LAB — LIPID PANEL
Cholesterol: 114 mg/dL (ref 0–200)
HDL: 47 mg/dL (ref >39)
LDL Cholesterol: 55 mg/dL (ref 0–99)
Total CHOL/HDL Ratio: 2.4 RATIO
Triglycerides: 61 mg/dL (ref <150)
VLDL: 12 mg/dL (ref 0–40)

## 2011-03-05 LAB — DIFFERENTIAL
Basophils Relative: 0 % (ref 0–1)
Eosinophils Absolute: 0 10*3/uL (ref 0.0–0.7)
Lymphocytes Relative: 10 % — ABNORMAL LOW (ref 12–46)
Monocytes Absolute: 0.7 10*3/uL (ref 0.1–1.0)
Monocytes Relative: 13 % — ABNORMAL HIGH (ref 3–12)
Neutro Abs: 4.3 10*3/uL (ref 1.7–7.7)

## 2011-03-05 LAB — AMMONIA
Ammonia: 110 umol/L — ABNORMAL HIGH (ref 11–35)
Ammonia: 175 umol/L — ABNORMAL HIGH (ref 11–35)
Ammonia: 29 umol/L (ref 11–35)

## 2011-03-05 LAB — URINALYSIS, ROUTINE W REFLEX MICROSCOPIC

## 2011-03-05 LAB — VITAMIN B12: Vitamin B-12: 1313 pg/mL — ABNORMAL HIGH (ref 211–911)

## 2011-03-05 LAB — TSH
TSH: 0.741 u[IU]/mL (ref 0.350–4.500)
TSH: 1.374 u[IU]/mL (ref 0.350–4.500)

## 2011-03-05 LAB — PROTIME-INR: INR: 1.41 (ref 0.00–1.49)

## 2011-03-05 LAB — ETHANOL: Alcohol, Ethyl (B): <5 mg/dL (ref 0–10)

## 2011-03-05 LAB — URINE MICROSCOPIC-ADD ON

## 2011-05-02 NOTE — H&P (Signed)
NAME:  Sarah Harmon, Sarah Harmon NO.:  [id number]   MEDICAL RECORD NO.:  [id number]          PATIENT TYPE:  INP   LOCATION:  2925                         FACILITY:  [facility]   PHYSICIAN:  Jana Hakim, M.D. DATE OF BIRTH:  10-25-1946   DATE OF ADMISSION:  08/06/2007  DATE OF DISCHARGE:                              HISTORY & PHYSICAL   PRIMARY CARE PHYSICIAN:  Dr. Willey Blade.   CHIEF COMPLAINT:  Abnormal lab studies.   HISTORY OF PRESENT ILLNESS:  This is a 65 year old female presenting to  the emergency department after being directed from her primary care  physician to present for evaluation and treatment secondary to abnormal  laboratory findings of a decreased potassium level, finding of a level  2.2.  The patient reports having diarrhea for the past 2 weeks.  Denies  having any nausea or vomiting.  She does report having weakness and  dizziness.  Denies having any chest pain or palpitations.  The patient  denies having any syncope or seizures.  She does report having weakness.   PAST MEDICAL HISTORY:  Hypertension.   PAST SURGICAL HISTORY:  None.   MEDICATIONS:  1. Diovan/HCT 320/25 mg one p.o. daily.  2. Xanax 0.5 mg one p.o. b.i.d. p.r.n.  3. Lotrel 10/20 mg one p.o. daily.   ALLERGIES:  No known drug allergies.   SOCIAL HISTORY:  The patient is a nonsmoker and nondrinker.   FAMILY HISTORY:  Noncontributory.   REVIEW OF SYSTEMS:  Pertinents are mentioned above.   PHYSICAL EXAMINATION FINDINGS:  This is a 65 year old well-nourished,  well-developed female in no visible distress or discomfort.  VITAL SIGNS:  Temperature 98.6, blood pressure 138/82, heart rate 88,  respirations 16, O2 saturations 100% on room air.  HEENT EXAMINATION:  Normocephalic, atraumatic.  Pupils equally round and  reactive to light.  Extraocular muscles are intact.  Funduscopic benign.  Oropharynx is clear.  NECK:  Supple, full range of motion.  No thyromegaly, adenopathy or  jugular venous distention.  CARDIOVASCULAR:  Regular rate and rhythm.  No murmurs, gallops or rubs.  LUNGS:  Clear to auscultation bilaterally.  ABDOMEN:  Positive bowel sounds, soft, nontender, nondistended.  EXTREMITIES:  Without cyanosis, clubbing or edema.  NEUROLOGIC EXAMINATION:  The patient is alert and oriented x3.  She does  have mild generalized weakness.  There are no focal deficits.   LABORATORY STUDIES:  White blood cell count of 1.8, hemoglobin 11.5,  hematocrit 33.4, platelets 139, MCV 92.2.  Chemistry with a sodium level  of 118, potassium level less than 2.0, chloride 81, CO2 25, BUN 4,  creatinine 0.74, glucose 110.  Cardiac enzymes:  Creatinine kinase 170,  CK-MB 4.7, relative index 2.2 and troponin 0.05.  EKG findings reveal a  normal sinus rhythm at a rate of 80, no acute ST-segment changes.   ASSESSMENT:  A 65 year old female being admitted with:   1. Severe hypokalemia.  2. Hyponatremia.  3. Acute diarrheal syndrome.  4. Neutropenia.  5. History of hypertension.   PLAN:  The patient will be admitted to a monitored area for cardiac  monitoring.  She  will continue on replacement of her potassium, which  was initiated by the emergency department physician.  A magnesium will  be checked stat and her magnesium will be replaced to help facilitate  potassium replacement.  Her hydrochlorothiazide/Diovan will be held for  now.  There repeat BMP will be checked in the a.m.  Stool studies have  been ordered, stool for C&S, culture and sensitivity, along with a C.  difficile study.  The patient denies having any recent antibiotic  therapy.  The patient has been placed on DVT and GI prophylaxis.  Further therapies will ensue pending results of her subsequent studies.      Jana Hakim, M.D.  Electronically Signed     HJ/MEDQ  D:  08/07/2007  T:  08/09/2007  Job:  [id number]   cc:   Royetta Crochet. Karlton Lemon, M.D.

## 2011-05-02 NOTE — Discharge Summary (Signed)
NAME:  Sarah Harmon, Sarah Harmon             ACCOUNT NO.:  [id number]   MEDICAL RECORD NO.:  [id number]          PATIENT TYPE:  INP   LOCATION:  5532                         FACILITY:  [facility]   PHYSICIAN:  Sueanne Margarita, PA   DATE OF BIRTH:  1946-04-28   DATE OF ADMISSION:  02/02/2008  DATE OF DISCHARGE:  02/05/2008                               DISCHARGE SUMMARY   ALLERGIES:  NO KNOWN DRUG ALLERGIES   FINAL DIAGNOSES:  1. Admitted with recurrent syncope, unclear etiology.  2. Electroencephalogram normal study, MR of the brain shows chronic      small vessel changes, no evidence of acute or reversible process.  3. CT of the chest ordered to rule out pulmonary embolism, never had      been done, this may be done as an outpatient at [facility].  4. A 24-hour urine for metanephrines has been collected and the study      results should be on the computer.  5. Patient will go home on low-dose beta blocker metoprolol 25 mg      b.i.d.   BRIEF HISTORY:  Ms .Harmon is a 65 year old female who has no previous  history of coronary artery disease.  On February 14, she drove to [place] with her brother.  She was visiting her mother in the hospital.  After the visit, she was sitting in the kitchen chair, and without  warning and without memory of any prodrome, the patient woke up on the  floor being shaken and her name called.  In fact, the patient actually  had two more episodes of rapid succession which witnesses say were pre-  triggered by her eyes rolling back into her head and her mouth agape.  She quickly returned to her consciousness each time with shaking and  verbal commands.  The patient had some fatigue after these events, and  was moved to a more comfortable chair where she dozed off.  The family  confirmed that there was no seizure-like activity.  There is no post  ictal state.  One sister took the patient's blood pressure and heart  rate about 4-5 minutes after the  event, both were reported as normal.  These findings were confirmed by EMS who had been called and arrived a  little later.   Later in the evening after all of going to bed, the patient got up and  went to the bathroom.  Family members were aroused by a sudden thump.  The patient was found to have passed out with feet in the bathroom and  head out into the hall.  The patient has never had any prior history of  presyncope or syncope, and she has had none since these events.  At  [facility], her blood pressure had been running high and she has  required some IV medication.  She is seen in consultation by Dr. Virl Axe who has seen her electrocardiogram showing second-degree AV block  with resting sinus tachycardia.  The patient does have a negative D-  dimer, but a CT of the chest was ordered despite this to  rule out  pulmonary embolism.  Unfortunately, had not been done prior to this  discharge.  At the present time, Cardiology recommending is outpatient  monitoring with a Life Watch Vest for 3 weeks and then a follow-up  appoint with Dr. Caryl Comes.  This will be made for March 12 at 3 o'clock.  The patient is asked not to drive for the next 3-6 months.   DISCHARGE MEDICATIONS:  1. Alprazolam 0.5 mg daily at bedtime.  2. Amlodipine/benazepril 10/20 daily.  3. Calcium citrate with vitamin D 2 tablets daily.  4. New medication:  Metoprolol 50 mg 1/2 tablet the morning, 1/2      tablet in the evening.  5. She is to stop taking Diovan.      Sueanne Margarita, PA     GM/MEDQ  D:  02/05/2008  T:  02/06/2008  Job:  [id number]   cc:   Deboraha Sprang, MD, [facility]

## 2011-05-02 NOTE — H&P (Signed)
NAME:  Sarah Harmon, Sarah Harmon NO.:  [id number]   MEDICAL RECORD NO.:  [id number]          PATIENT TYPE:  INP   LOCATION:  [id number]                         FACILITY:  [facility]   PHYSICIAN:  Vladimir Faster, MD        DATE OF BIRTH:  05-01-1946   DATE OF ADMISSION:  02/02/2008  DATE OF DISCHARGE:                              HISTORY & PHYSICAL   PRIMARY CARE PHYSICIAN:  Royetta Crochet. Karlton Lemon, M.D.   CHIEF COMPLAINT:  Syncope.   HISTORY OF PRESENT ILLNESS:  Ms. Gagne is a 65 year old lady with a  history of hypertension, who comes in after two syncopal episodes.  Her  first episode was yesterday when she was sitting at a table with her  family.  She apparently passed out for approximately 10 minutes.  She  had no warning signs and when she woke up she did not have any  confusion.  She went back to bed and this morning she was walking to her  bathroom when she passed out again.  She said she was walking a few  paces when she suddenly passed out.  She denies having light-headedness,  dizziness, headache, any chest pain, palpitations with the attack.  When  she woke up she was on the floor.  She has no other complaints at this  time.   PAST MEDICAL HISTORY:  Significant for hypertension.   PAST SURGICAL HISTORY:  None.   ALLERGIES:  No known drug allergies.   CURRENT MEDICATIONS:  1. She is on Norvasc 10 mg daily.  2. Lotrel 10/20 daily.  3. Diovan 160 mg daily.   SOCIAL HISTORY:  She denies any history of smoking, alcohol, or drug  abuse.   FAMILY HISTORY:  Noncontributory.   REVIEW OF SYSTEMS:  GENERAL:  She denies any recent weight loss, weight  gain.  No fever or chills.  HEENT:  No headaches, no blurred vision, no  sore throat.  CARDIOVASCULAR:  She denies chest pain, palpitations.  RESPIRATORY:  No cough.  GASTROINTESTINAL:  No abdominal pain, nausea,  or vomiting, or diarrhea or constipation.  GENITOURINARY:  No dysuria or  polyuria.   PHYSICAL EXAMINATION:  She  is alert and oriented x3.  VITAL SIGNS:  Temperature 98.3, pulse rate of 98, blood pressure  initially on admission was 198/100, then it rose up to 221/113 and at  this time has come down to 112/71.  Respiratory rate is 20 per minute,  oxygen saturation 98% on room air.  HEENT:  Head atraumatic, normocephalic.  Pupils are bilaterally equal  and react to light.  Mucous membranes are moist.  NECK:  Supple.  No JVD, no carotid bruit.  CARDIOVASCULAR SYSTEM:  S1 and S2 heard, regular rhythm.  CHEST:  Clear to auscultation.  ABDOMEN:  Soft.  Bowel sounds are heard.  EXTREMITIES:  No clubbing.   LABORATORY DATA:  White count 2.3, hemoglobin 15, platelets 145.  Sodium  116, potassium 4.3, chloride 89, bicarb 26, BUN 4, creatinine of 0.9,  glucose 120.  Urinalysis is negative.   X-ray of chest shows no active process.  CT  of head showed no acute  findings.  X-ray of cervical spine shows no fractures.   IMPRESSION:  1. Syncope.  2. Hyponatremia.  3. Malignant hypertension.  4. Leukopenia.   PLAN:  1. Syncope.  This is a 65 year old who comes in after she had two      episodes of syncope.  The history is not pointing to the etiology      of the syncope at this time.  I will cycle cardiac markers and keep      her on a cardiac marker.  I will also order MRI of the brain.  She      does have hyponatremia with a sodium of 116 and this could have      caused seizures which could be the cause of her syncope, but there      were no witnessed seizures.  I will get an EEG on her too.  2. Hyponatremia.  Her sodium is 116.  Her sodium has been low in the      past and she was admitted with a sodium of 118 in August of 2008.      At that time her sodium did correct with IV fluids.  I will get      urine and serum osmolality to rule out SIADH.  I will hydrate her      with normal saline at this time.  3. Malignant hypertension.  Her blood pressure has been very high in      the ER but there have  been very wide fluctuations in her blood      pressure.  I would continue her blood pressure medications as at      home if her blood pressure dropped too low and that caused her      syncope.  4. Leukopenia.  We will watch her white cell count in the hospital.      If it continues to remain low she may need a hematology consult as      an outpatient.  5. Put her on DVT prophylaxis.      Vladimir Faster, MD  Electronically Signed     PKN/MEDQ  D:  02/03/2008  T:  02/03/2008  Job:  [id number]

## 2011-05-02 NOTE — Consult Note (Signed)
NAME:  Sarah Harmon, Sarah Harmon NO.:  [id number]   MEDICAL RECORD NO.:  [id number]          PATIENT TYPE:  INP   LOCATION:  2621                         FACILITY:  [facility]   PHYSICIAN:  Juanda Bond. Burt Knack, MD  DATE OF BIRTH:  January 11, 1946   DATE OF CONSULTATION:  02/03/2008  DATE OF DISCHARGE:                                 CONSULTATION   PRIMARY CARE PHYSICIAN:  Dr. Willey Blade.   PRIMARY CARDIOLOGIST:  None.   CHIEF COMPLAINT:  Syncope.   HISTORY OF PRESENT ILLNESS:  Sarah Harmon is a 65 year old female with  no previous history of coronary artery disease.  On February 01, 2008,  she drove to [place] to visit her mother.  She was sitting in a  chair in the kitchen after she got there and woke up on the floor.  EMS  was called.  They checked her blood pressure and her CBG which were  okay.  She had a second syncopal episode at 4 a.m. on February 02, 2008  when she got up to the bathroom and woke up on the floor.  She was found  by her family.  For both episodes,  Sarah Harmon states that there was  no prodrome, no aura, and no postictal state.  The first one was  witnessed by family members and no seizure activity was described.  There was no incontinence of bowel or bladder.  She had no palpitations  and no chest pain.  She had mild musculoskeletal injuries, but no  significant problems.  She has never had any symptoms such as these  before.  She has no recent history of dizziness and no history of  orthostatic symptoms.  Since being admitted to the hospital, at one  point, she had a bradycardia that was second-degree heart block, Mobitz  type 1, but she had no awareness of this and is not ever aware of her  heart beating out of rhythm, except for occasional brief tachy  palpitations that cause no symptoms.   PAST MEDICAL HISTORY:  1. Status post echocardiogram in August 2008 showing an EF of 60% with      no wall motion abnormalities, mild focal basal  septal hypertrophy,      mild aortic valve thickening.  2. Hypertension.  3. History of hypokalemia and hyponatremia with a sodium of 118 and a      potassium less than 2 in August 2008 felt secondary to diuretic use      and a GI illness with diarrhea.  4. Pancytopenia  5. Hypoalbuminemia.  6. History of a junctional rhythm with atrial escape beats in August      2008 in the setting of metabolic abnormalities.   SURGICAL HISTORY:  None.   ALLERGIES:  No known drug allergies.   MEDICATIONS:  Prior to admission, include:  Lotrel 10/20 daily and  Diovan.  However, the pharmacy states that those have not been filled  since August.  The patient states she has been getting samples from her  physician and taking them regularly.   CURRENT MEDICATIONS:  1. Norvasc 10 mg a  day.  2. Lotensin 20 mg a day.  3. DVT Lovenox.  4. Avapro 300 mg a day.  5. Protonix 40 mg a day.   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  She has no history of alcohol or drug abuse.  She drinks a few beers a week, but states that it is not more than 3.  Per her family, she is not active and does not exercise.   FAMILY HISTORY:  Her mother is alive at age 79 with no history of  coronary artery disease.  Her father died in his 72s with no history of  coronary artery disease, and she has no history of coronary artery  disease in any of her siblings.   REVIEW OF SYSTEMS:  She has noted muscle cramping on a regular basis.  She has occasional episodes of depression.  She denies hematemesis,  hemoptysis, or melena.  She has had no recent reflux symptoms, nausea,  vomiting, or diarrhea.  She feels that she has intolerance to cold.  She  describes occasional chest tightness which she feels is brought on by  stress, but cannot remember the last time she had any.  She has  occasional tachy palpitations that do not cause symptoms.  A full 14-  point review of  systems is, otherwise, negative except as described in  the HPI.   PHYSICAL EXAM:  VITAL SIGNS:  Temperature is 99.3, blood pressure  154/84, heart rate 76, respiratory rate 20, O2 saturation 100% on room  air.  GENERAL:  She is a well-developed, African American female in no acute  distress.  HEENT:  Normal.  NECK:  There is no lymphadenopathy, thyromegaly, bruit, or JVD noted.  CVA:  Heart is regular in rate and rhythm with an S1 and S2, and a soft  systolic murmur is noted at the left upper sternal border.  Distal  pulses are intact in all 4 extremities, and no femoral bruits are  appreciated.  LUNGS:  Clear to auscultation bilaterally.  SKIN:  No rashes or lesions are noted.  ABDOMEN:  Soft and nontender with active bowel sounds.  EXTREMITIES:  There is no calf tenderness or cords noted.  No cyanosis,  clubbing, or edema.  MUSCULOSKELETAL:  There are no joint deformity or effusions, and no  spine or CVA tenderness.  NEURO:  She is alert and oriented.  Cranial nerves II-XII grossly  intact.   IMAGING AND X-RAYS:  1. Chest x-ray no acute disease.  2. Head CT showed mild atrophy, but no acute.  3. Neck x-ray showed no fracture, but spurring is present bilaterally      at C5-6 and C6-7.  4. EKG on admission:  Sinus rhythm rate 81 with left ventricular      hypertrophy and repolarization abnormality.  EKG on February 03, 2008 showed Mobitz type 1 second-degree AV block with junctional      escape beats and anterior T-wave changes.  EKG on February 03, 2008      in the p.m. shows sinus rhythm rate 80 beats per minute and      improved T-wave changes.   LABORATORY VALUES:  Sodium on admission 116, now 126; potassium 4.3,  chloride 92, CO2 26, BUN 4, creatinine 0.79, glucose 102.  Hemoglobin  15, hematocrit 43.9, WBCs 2.3, platelets 145.  Serum osmolality slightly  low at 250.  TSH 0.622.  CK-MB:  1. 116/5.3.  2. 158/5.8.  With Troponin I 0.04 and 0.05.   IMPRESSION:   Sarah Harmon is a 65 year old female with no prior cardiac  history.  She had 2 episodes of syncope with no prodrome in the last 3  days.  Her EKG showed second-degree heart block at times with junctional  escape beats.  There is a T-wave abnormality that is suspected to be  related to her conduction disease rather than ischemic changes.  Her  symptoms are typical for arrhythmogenic syncope.  She will be continued  on telemetry.  An EP consult will be obtained in a.m. to see if an EP  study is indicated.  We will check a D-dimer and repeat her  echocardiogram.  Further evaluation of her hyponatremia,  hypochloridemia, and other metabolic abnormalities will be per the  primary team, but we will recheck her cardiac enzymes once more in a.m.Rosaria Ferries, PA-C      Juanda Bond. Burt Knack, MD  Electronically Signed    RB/MEDQ  D:  02/03/2008  T:  02/04/2008  Job:  [id number]   cc:   Royetta Crochet. Karlton Lemon, M.D.

## 2011-05-02 NOTE — Procedures (Signed)
EEG NUMBER:  [id number]   HISTORY:  This is a 65 year old patient who is being evaluated for  syncope.  This is a routine EEG.  No skull defects noted.   MEDICATIONS:  Include Lovenox, Protonix, Norvasc, Lotensin, Avapro,  Tylenol, Apresoline, Xanax.   EEG CLASSIFICATION:  Normal weight.   DESCRIPTION:  The background rhythm of this recording is fairly well  modulated medium amplitude 9 Hz background activity that is reactive to  eye opening and closure.  As the record progresses, the patient appears  to remain in the awake state throughout the recording.  Photic  stimulation is performed resulting in bilateral and symmetric photic  drive response.  Hyperventilation is also performed resulting in a  minimal buildup of background rhythm activities without significant  slowing seen.  At no time during the recording does there appear to be  evidence of spikes, spike wave discharges, or evidence of focal slowing.  EKG monitor shows no evidence of cardiac rhythm abnormalities with heart  rate of 66.   IMPRESSION:  This is a normal EEG recording in the waking state.  No  evidence of ictal or interictal discharges were seen.      Jill Alexanders, M.D.  Electronically Signed     [id number]  D:  02/04/2008 11:45:06  T:  02/05/2008 09:27:09  Job #:  [id number]

## 2011-05-02 NOTE — Consult Note (Signed)
NAME:  Sarah Harmon, Sarah Harmon NO.:  [id number]   MEDICAL RECORD NO.:  [id number]          PATIENT TYPE:  INP   LOCATION:  5532                         FACILITY:  [facility]   PHYSICIAN:  Deboraha Sprang, MD, FACCDATE OF BIRTH:  1946-07-15   DATE OF CONSULTATION:  02/04/2008  DATE OF DISCHARGE:                                 CONSULTATION   PRIMARY CAREGIVER/REFERRING PHYSICIAN:  Royetta Crochet. Karlton Lemon, M.D.   CONSULTING ELECTROPHYSIOLOGIST:  Deboraha Sprang, MD, [facility]   ALLERGIES:  This patient has no known drug allergies.   BRIEF HISTORY:  Ms. Ansell is a 65 year old African-American female  with a history of hypertension AND dyslipidemia.  She had multiple  episodes of syncope in rapid succession with one follow-up episode later  in the day.   On 14th she had driving with her brother from [place] to [place] making a 4-hour trip to visit her hospitalized mother.  After  the visit and after eating, she suddenly fell out of her chair at the  table.  She is aware only of conversing with her sister, as she was  approaching, and then be helped back to a chair being shaken, and her  name being called.  Once back in the chair, in succession, she had two  more episodes which witnesses said were prefigured by her eyes rolling  back into her head, mouth agape  quickly returning to consciousness  with shaking and verbal commands.  Patient not specifically aware of any  symptoms leading up to these losses of consciousness, such as dizziness,  nausea, or diaphoresis.   The patient had some fatigue after all of these events, and she dosed  off in a more comfortable chair.  The family confirms that there is no  seizure-like activity.  No postictal state.  One sister took the  patient's blood pressure and heart rate about 4-5 minutes after the  events, and both blood pressure and heart rate were reported as normal.  EMS arrived some minutes later, and they confirmed that heart  rate and  blood pressure were normal.   Later in the after all had gone to bed, the patient got up to go to the  bathroom, and family members were aroused by a sudden thump.  The  patient was found to have passed out with the feet in the bathroom, and  head heading out into the hall.   On extensive questioning by Dr. Jens Som, the patient relates that she has  never had any history of fainting or spells in the past.  These are the  first episode.  The patient is not having any feelings of dizziness or  swirling in the recent past.  The patient is not having chest pain or  shortness of breath with activity.  She was admitted through the  emergency room to [facility] on February 16.  Her  electrocardiogram shows blocked beats one beat conducted and the other  blocked in synchronous fashion, consistent with Mobitz I.  The patient  had a negative D-dimer of 0.29.  Her troponin I values were cycled q.8  h. x3 and all three were negative.  TSH was 0.622 the patient's BNP on  admission was 1620.   MEDICATIONS:  At the time of this dictation her medications at [facility] are  1. Lovenox 40 mg subcu q.24 h.  2. Protonix 40 mg daily.  3. Norvasc 10 mg daily.  4. Benazepril 20 mg daily.  5. Avapro 300 mg daily.  6. IV hydralazine as needed.   SOCIAL HISTORY:  The patient lives independently in [place]. She  works as a Personal assistant.  She does not smoke.  She may drink three  beers a week.  She does not partake of recreational drugs.   FAMILY HISTORY:  Mother still living at age 57.  No prior history of  coronary artery disease.  Father died when he was in his 1s.  The  patient has 13, total children in the family.  Ten siblings are still  living, none with coronary artery disease.   PHYSICAL EXAM:  GENERAL:  Alert and oriented x3 in no acute distress.  VITAL SIGNS:  Temperature 96.8, blood pressure 210/93, pulse is 116 and  regular in sinus tachycardia.  HEENT:   Normocephalic, atraumatic.  Eyes - pupils equal, round, reactive  to light.  Extraocular movements are intact.  Nares are patent.  Oropharynx shows that the mucous membranes are pink and moist.  NECK:  Supple without carotid bruits bilaterally.  LUNGS:  Clear to auscultation bilaterally.  HEART:  Regular rate and rhythm but rapid.  ABDOMEN:  Soft, nondistended.  Bowel sounds are present.  EXTREMITIES:  Show no evidence of edema or clubbing.  NEUROLOGICALLY:  She is grossly intact.   OTHER LAB STUDIES:  Complete blood count on admission hemoglobin 15,  hematocrit 43.9, white cells 2.3, and platelets are 145.  Serum  electrolytes:  Sodium 132, potassium 4.3, chloride 103, carbonate 24,  BUN 6, creatinine 1.05, glucose is 91.  Urinalysis is negative.  The  patient had a 2-D echocardiogram February 16.  This study shows left  ventricular systolic function normal, ejection fraction of 60%.  There  was mild focal basal septal hypertrophy.  The patient also had, a MR of  the brain, negative for acute or subacute stroke, brain stem and  cerebellum unremarkable.  The cerebral hemisphere showed chronic small  vessel changes within the deep and subcortical white matter.  No sign of  mass hemorrhage, hydrocephalus, or extra-axial collection.  No  significant sinus disease.  CT of the head was also taken February 16.  There was mild cortical and cerebellar atrophy.  No blood, edema, or  mass effect.  Urinalysis was negative.   IMPRESSION:  1. Recurrent syncope.      a.     No record of decreased blood pressure or decreased heart       rate.      b.     No record of pauses or bradycardia on telemetry.      c.     The patient has resting tachycardia.      d.     Episodes of Mobitz I (blocked beating).  2. Poorly controlled hypertension.   The patient seen by Dr. Rupert Stacks who would wish to have more tight  control of her blood pressure.  He would add a beta blocker, but not  until he collects  a 24 hour sodium urine for metanephrines.  We will  also plan to check a CT of the chest to rule out  pulmonary embolism  despite a negative D-dimer.  Continue the patient on telemetry, and the  patient will probably need an outpatient monitor, preferably a life  watch, for the next 3 weeks or perhaps an  implantable loop recorder.  She is cautioned not to drive for the next 3-  6 months.  We will check on the availability of life watch for this  patient.   Dictating thanks      Sueanne Margarita, [place]      Deboraha Sprang, MD, [facility]  Electronically Signed    GM/MEDQ  D:  02/04/2008  T:  02/05/2008  Job:  [id number]

## 2011-05-02 NOTE — Discharge Summary (Signed)
NAME:  Sarah Harmon, Sarah Harmon NO.:  [id number]   MEDICAL RECORD NO.:  [id number]          PATIENT TYPE:  INP   LOCATION:  [id number]                         FACILITY:  [facility]   PHYSICIAN:  Sarah Harmon, D.O.    DATE OF BIRTH:  [birth year]   DATE OF ADMISSION:  02/02/2008  DATE OF DISCHARGE:  02/05/2008                               DISCHARGE SUMMARY   PRIMARY CARE PHYSICIAN:  Dr. Willey Harmon.   CARDIOLOGIST:  During this admission, as Dr. Virl Harmon of  electrophysiology.   Copy to Dr. Estella Harmon, who I am requesting a referral for outpatient  followup.   FINAL DIAGNOSES:  1. Syncope of unclear etiology, status post extensive inpatient      evaluation without recurrence.  She has undergone a consultation      with electrophysiology.  Dr. Caryl Harmon was recommended LifeWatch      monitor, as well as emphasized that she should not drive until      these events are further explained.  I have also recommended the      initiation of a beta blocker after her 24-hour urine collection is      completed for metanephrines.  The results of this 24-hour urine      metanephrine will not be available at the time of this dictation,      and I am requesting that they be followed up by Dr. Karlton Harmon this      week.  I have requested that Sarah Harmon contact Dr. Roland Harmon      office for follow-up appointment this week, in order to have also      blood work including a basic metabolic panel.  She is instructed to      follow up to proceed to [facility] office on [facility], to pick up a Life watch monitor which has been arranged by      Sarah Harmon.  2. Hypertension.  She did present with significant hyponatremia      unfortunately urinary osmo was not checked, but this is suspected      related to her home medications on admission, which include Norvasc      10 mg daily, Lotrel 10/20 and Diovan 160 mg daily.  Will      discontinue one of these medications and add beta  blocker as      recommended by electrophysiology.  Will initiate Lopressor 25 mg      p.o. b.i.d.   STUDIES PERFORMED THIS ADMISSION:  She would underwent MRI of brain,  essentially no acute findings.  Her 2-D echocardiogram revealed an  ejection fraction of 60% .  Her EEG was normal.  Chest CT was also  normal.  TSH was 0.622.  His final sodium was 133 and potassium was 3.6,  BUN 4,  creatinine 0.88 and glucose 88.   LABORATORY:  Her other laboratory data included a negative D-dimer.  Troponins were also negative.  Her BNP was mildly elevated at 162.   DISPOSITION:  At present, Sarah Harmon is felt to be medically stable  for  discharge home.  The etiology of her presenting complaint is not  completely delineated, as her workup is to be continued in the  outpatient setting for primary care physician, Dr. Willey Harmon, as  well as Dr. Virl Harmon.  I have also contacted Dr. Estella Harmon of  Neurology, who has instructed that an outpatient referral would be  reasonable.  I will cc a copy of this summary to Dr. Karlton Harmon, so that  this can be accomplished.   HISTORY OF PRESENT ILLNESS:  For full details, please refer to the H&P  as dictated by Dr. Carlton Harmon.  However, briefly, Sarah Harmon is a 1-year-  old female with a history of hypertension presented with two syncopal  episodes.  Her first episode was the day prior to presentation, when she  was sitting at a table with her family.  She apparently passed out for  approximately 10 minutes.  She had no warning signs when she woke up.  She is not having any confusion.  She was back to bed, and the morning  of presentation she was walking to her bathroom when she passed out  again.  She said she was walking a few paces, when she suddenly passed  out.  She denies having lightheadedness, dizziness, or any chest pain,  palpitations with the attack.  When she woke up, she was on the floor.  Family describes no tonic-clonic or postictal  symptoms.   HOSPITAL COURSE:  Mrs. Sarah Harmon was admitted, underwent thorough  evaluation, underwent cardiology consultation, Sarah Harmon and Dr.  Caryl Harmon.  Underwent EP evaluation who recommended a 24-hour urinary  metanephrine collection, which is in the process of completion.  In any  event, they have recommended a beta blocker.  Her serum sodium corrected  to 133.  I suspect this is medication-related.  A urine osmo was  unfortunately not collected.  In any event, she is felt to be stable for  transition home.  She is instructed not to drive for least 6 months, and  she will have a monitor, LifeWatch, set up for her upon discharge.      Sarah Harmon, D.O.  Electronically Signed     ESS/MEDQ  D:  02/05/2008  T:  02/06/2008  Job:  [id number]   cc:   Sarah Harmon. Sarah Harmon, M.D.  Sarah Sprang, MD, [facility]on  Sarah Bond. Burt Knack, MD  Sarah Saas, MD

## 2011-05-05 NOTE — Discharge Summary (Signed)
NAME:  Sarah Harmon NO.:  [id number]   MEDICAL RECORD NO.:  [id number]          PATIENT TYPE:  INP   LOCATION:  2925                         FACILITY:  [facility]   PHYSICIAN:  Aquilla Hacker, M.D. DATE OF BIRTH:  01-May-1946   DATE OF ADMISSION:  08/06/2007  DATE OF DISCHARGE:  08/09/2007                               DISCHARGE SUMMARY   The patient's primary care doctor is Dr. Willey Blade.   FINAL DIAGNOSES:  1. Hypokalemia.  2. Hyponatremia.  3. Diarrhea.  4. Pancytopenia.  5. Junctional rhythm with atrial escape beat.  6. Hypoalbumin.   PROCEDURES:  CT scan of the abdomen and pelvis completed on August 08, 2007.   HISTORY OF PRESENT ILLNESS:  Sarah Harmon is a 65 year old female who  was seen by her primary care doctor earlier in the day and discovered to  have a potassium level of 2.2.  She also had experienced 2 weeks of  diarrhea.  She was therefore referred to the hospital's emergency  department for further evaluation.   For past medical history, please see that dictated by Dr. Jana Hakim.   HOSPITAL COURSE:  Problem 1.  SEVERE HYPOKALEMIA:  The patient was admitted to the step-  down area and provided with potassium chloride IV q.1h. x4.  Over the  course of her hospitalization her potassium level was corrected.   Problem 2.  HYPONATREMIA:  The patient was noted to have had a sodium  level of 118 at the time of admission.  For this she was provided with  normal saline,  and this corrected.   Problem 3.  DIARRHEAL ILLNESS:  The etiology of this was questionable.  The patient did not have any abdominal pain.  A CT scan of the patient's  abdomen and pelvis was completed on August 08, 2007.  CT scan of the  abdomen revealed a complex cyst in the upper pole of the left kidney.  Renal ultrasound was recommended for further characterization.  It also  revealed fatty infiltration of the liver.  Gallstones with mild  prominence of the  gallbladder wall.  CT scan of the pelvis revealed no  evidence of pelvic mass.  Distended bladder.  By the day of discharge,  the patient indicated that she felt significantly better.   Problem 4.  PANCYTOPENIA:  The patient's white blood cell count was  noted to be 1.8, her hemoglobin 11.5, hematocrit 33.4 and platelets 139  at the time of admission.  These parameters were monitored daily.  It  was decided that the patient could follow up with her primary care  physician for further evaluation.   Problem 5.  JUNCTIONAL RHYTHM WITH ATRIAL ESCAPE BEATS:  The patient was  otherwise asymptomatic secondary to this particular arrhythmia.  Whether  or not the electrolyte abnormalities contributed is questionable.  A 2-D  echo was completed on August 08, 2007, the results of which revealed  overall left ventricular systolic function to be normal.  Left  ventricular EF was estimated to be 60%.  There were no left ventricular  regional wall motion abnormalities.  By the date of discharge the patient indicated that she felt  significantly better.  She requests to be discharged from the hospital.  Her sodium was 134, her potassium 3.7, chloride 108, CO2 20, BUN less  than 1, creatinine 0.65, glucose 93.  Her white blood cell count was  2.5, hemoglobin 10.2, hematocrit 29.8, platelets 105.  Bilirubin total  0.6, alkaline phosphatase 50, SGOT 48, SGPT 30, total protein 6.4,  albumin 2.4, calcium 7.6.   The patient was discharged home on:  1. Norvasc 10 mg p.o. daily.  2. Lotrel 10/20 mg p.o. daily.  3. Diovan 160 mg p.o. daily.   She was told to stop taking the Diovan/HCT until she sees her regular  doctor.  She was told to call Dr. Willey Blade within 1 week for  follow-up.      Aquilla Hacker, M.D.  Electronically Signed     OR/MEDQ  D:  09/25/2007  T:  09/25/2007  Job:  [id number]

## 2011-06-22 ENCOUNTER — Encounter: Payer: Self-pay | Admitting: Internal Medicine

## 2011-06-25 ENCOUNTER — Encounter: Payer: Self-pay | Admitting: Internal Medicine

## 2011-06-25 DIAGNOSIS — Z0000 Encounter for general adult medical examination without abnormal findings: Secondary | ICD-10-CM | POA: Insufficient documentation

## 2011-06-27 ENCOUNTER — Other Ambulatory Visit: Payer: Self-pay

## 2011-06-27 ENCOUNTER — Other Ambulatory Visit (INDEPENDENT_AMBULATORY_CARE_PROVIDER_SITE_OTHER): Payer: Medicare Other

## 2011-06-27 ENCOUNTER — Encounter: Payer: Self-pay | Admitting: Internal Medicine

## 2011-06-27 ENCOUNTER — Ambulatory Visit (INDEPENDENT_AMBULATORY_CARE_PROVIDER_SITE_OTHER): Payer: Medicare Other | Admitting: Internal Medicine

## 2011-06-27 VITALS — BP 122/80 | HR 78 | Temp 97.8°F | Ht 64.0 in | Wt 117.5 lb

## 2011-06-27 DIAGNOSIS — F411 Generalized anxiety disorder: Secondary | ICD-10-CM

## 2011-06-27 DIAGNOSIS — E7220 Disorder of urea cycle metabolism, unspecified: Secondary | ICD-10-CM

## 2011-06-27 DIAGNOSIS — F1010 Alcohol abuse, uncomplicated: Secondary | ICD-10-CM

## 2011-06-27 DIAGNOSIS — I10 Essential (primary) hypertension: Secondary | ICD-10-CM

## 2011-06-27 LAB — BASIC METABOLIC PANEL
BUN: 24 mg/dL — ABNORMAL HIGH (ref 6–23)
CO2: 28 mEq/L (ref 19–32)
Calcium: 10.2 mg/dL (ref 8.4–10.5)
Creatinine, Ser: 1.4 mg/dL — ABNORMAL HIGH (ref 0.4–1.2)
Glucose, Bld: 93 mg/dL (ref 70–99)

## 2011-06-27 MED ORDER — LACTULOSE ENCEPHALOPATHY 10 GM/15ML PO SOLN: 30.0000 g | Freq: Every day | ORAL | Status: DC

## 2011-06-27 MED ORDER — ALPRAZOLAM 0.5 MG PO TABS: 0.5000 mg | ORAL_TABLET | Freq: Every evening | ORAL | Status: DC | PRN

## 2011-06-27 MED ORDER — SPIRONOLACTONE 50 MG PO TABS: 50.0000 mg | ORAL_TABLET | Freq: Every day | ORAL | Status: DC

## 2011-06-27 NOTE — Patient Instructions (Signed)
OK to decrease the chronulac to 30 gm per day Please go to LAB in the Basement for the blood and/or urine tests to be done today .bleuj Continue all other medications as before Please return in 6 months, or sooner if needed

## 2011-06-27 NOTE — Assessment & Plan Note (Signed)
With some loose stool ongoing - ok to try decresae the chronulac to 30 gm qd , f/u next visit

## 2011-06-27 NOTE — Assessment & Plan Note (Signed)
stable overall by hx and exam, has been abstinent for one yr,  to f/u any worsening symptoms or concerns

## 2011-06-27 NOTE — Assessment & Plan Note (Signed)
stable overall by hx and exam, most recent data reviewed with pt, and pt to continue medical treatment as before  Lab Results  Component Value Date   WBC 3.8* 12/23/2010   HGB 11.5* 12/23/2010   HCT 33.7* 12/23/2010   PLT 117.0* 12/23/2010   CHOL 147 12/23/2010   TRIG 103.0 12/23/2010   HDL 66.30 12/23/2010   ALT 15 12/23/2010   AST 24 12/23/2010   NA 133* 12/23/2010   K 6.0* 12/23/2010   CL 102 12/23/2010   CREATININE 1.3* 12/23/2010   BUN 22 12/23/2010   CO2 26 12/23/2010   TSH 1.28 12/23/2010   INR 1.41 06/05/2010

## 2011-06-27 NOTE — Assessment & Plan Note (Signed)
stable overall by hx and exam, most recent data reviewed with pt, and pt to continue medical treatment as before, to re-check bmp  BP Readings from Last 3 Encounters:  06/27/11 122/80  12/23/10 110/70  07/13/10 110/70

## 2011-06-27 NOTE — Progress Notes (Signed)
Subjective:    Patient ID: Sarah Harmon, female    DOB: 05-05-1946, 65 y.o.   MRN: [id number]  HPI  Here with family, overall doing well, no ETOH for approx 1 yr, and nutrition appaers to have improved.  Spends most of her time in bed and not overly active during the day.  Has to go up a flight of 10 stairs on occasion which can cause some DOE, but o/w Pt denies chest pain, increased sob or doe, wheezing, orthopnea, PND, increased LE swelling, palpitations, dizziness or syncope.  Pt denies new neurological symptoms such as new headache, or facial or extremity weakness or numbness   Pt denies polydipsia, polyuria Pt states overall good compliance with meds, trying to follow lower cholesterol diet, wt overall stable but little exercise however.  Does have some loose stool on current dose 60 gm chronulac, no chang in mentation per family with reduction form 60 bid to qd .  Last ammonia level 28 in dec 2011.  Did have spurious elev K, which will need to be checked again.  Pt continues to have recurring LBP without change in severity, bowel or bladder change, fever, wt loss,  worsening LE pain/numbness/weakness, gait change or falls, only occurs with sweeping the home. Denies worsening depressive symptoms, suicidal ideation, or panic, though has ongoing anxiety, not increased recently.   Past Medical History  Diagnosis Date  . ALCOHOL ABUSE 07/13/2010  . ANXIETY 12/22/2009  . DEPRESSION 12/22/2009  . 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 FATIGUE 12/23/2010  . GALLSTONES 12/22/2009  . HYPERLIPIDEMIA 12/22/2009  . HYPERTENSION 12/22/2009  . HYPONATREMIA 12/22/2009  . 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 OSTEOPENIA 12/22/2009  . Pancytopenia 07/13/2010  . RENAL CYST, LEFT 12/22/2009  . VITAMIN D DEFICIENCY 12/23/2010  . WEIGHT LOSS 12/23/2010  . DJD (degenerative joint disease), cervical   . Renal cyst   . Gallstones    No past surgical history on file.  reports that she has never smoked. She does not have  any smokeless tobacco history on file. She reports that she drinks alcohol. She reports that she does not use illicit drugs. family history includes Breast cancer in her sister. No Known Allergies Current Outpatient Prescriptions on File Prior to Visit  Medication Sig Dispense Refill  . alendronate (FOSAMAX) 70 MG tablet Take 70 mg by mouth every 7 (seven) days. Take with a full glass of water on an empty stomach.       . Calcium Carbonate-Vitamin D (CALTRATE 600+D) 600-400 MG-UNIT per tablet Take 1 tablet by mouth daily.        . Cholecalciferol (VITAMIN D3) 1000 UNITS CAPS Take by mouth daily.        . citalopram (CELEXA) 10 MG tablet Take 10 mg by mouth daily.        . cloNIDine (CATAPRES - DOSED IN MG/24 HR) 0.2 mg/24hr patch Place 1 patch onto the skin once a week.        . folic acid (FOLVITE) 1 MG tablet Take 1 mg by mouth daily.        Marland Kitchen lactulose, encephalopathy, (GENERLAC) 10 GM/15ML SOLN Take 60 g by mouth daily.        . Multiple Vitamin (MULTIVITAMIN) tablet Take 1 tablet by mouth daily.        . propranolol (INDERAL) 10 MG tablet Take 10 mg by mouth 2 (two) times daily.        Marland Kitchen spironolactone (ALDACTONE) 100 MG tablet Take 100  mg by mouth daily.        . Thiamine HCl (VITAMIN B-1) 100 MG tablet Take 100 mg by mouth daily.        Marland Kitchen DISCONTD: ALPRAZolam (XANAX) 0.5 MG tablet Take 0.5 mg by mouth at bedtime as needed.         Review of Systems Review of Systems  Constitutional: Negative for diaphoresis and unexpected weight change.  HENT: Negative for drooling and tinnitus.   Eyes: Negative for photophobia and visual disturbance.  Respiratory: Negative for choking and stridor.   Gastrointestinal: Negative for vomiting and blood in stool.  Genitourinary: Negative for hematuria and decreased urine volume.  Musculoskeletal: Negative for gait problem.  Skin: Negative for color change and wound.  Neurological: Negative for tremors and numbness.  Psychiatric/Behavioral: Negative  for decreased concentration. The patient is not hyperactive.       Objective:   Physical Exam BP 122/80  Pulse 78  Temp(Src) 97.8 F (36.6 C) (Oral)  Ht 5\' 4"  (1.626 m)  Wt 117 lb 8 oz (53.298 kg)  BMI 20.17 kg/m2  SpO2 99% Physical Exam  VS noted, thin but better than malnourished 2011 Constitutional: Pt appears well-developed and well-nourished.  HENT: Head: Normocephalic.  Right Ear: External ear normal.  Left Ear: External ear normal.  Eyes: Conjunctivae and EOM are normal. Pupils are equal, round, and reactive to light.  Neck: Normal range of motion. Neck supple.  Cardiovascular: Normal rate and regular rhythm.   Pulmonary/Chest: Effort normal and breath sounds normal.  Abd:  Soft, NT, non-distended, + BS Neurological: Pt is alert. No cranial nerve deficit. motor/dtr/sens intact LE's, gait ok Skin: Skin is warm. No erythema.  Psychiatric: Pt behavior is normal. Thought content normal. 1+ nervous        Assessment & Plan:

## 2011-07-21 ENCOUNTER — Other Ambulatory Visit: Payer: Self-pay | Admitting: Internal Medicine

## 2011-07-21 NOTE — Telephone Encounter (Signed)
[facility] requesting refill on Alprazolam w/SIG: take 1 tablet by mouth every day EMR for patient shows SIG: take one tablet by mouth at bedtime as needed and a printed Rx to patient at 06/27/11 OV. Spoke w/pharmacist and verified last refill: 06/23/11 [last fill on 12/23/2010 Rx] Please advise on refill request.

## 2011-07-24 NOTE — Telephone Encounter (Signed)
Too soon, just had 21 with 5 ref done July 10

## 2011-09-08 LAB — CBC
HCT: 43.9
Platelets: 145 — ABNORMAL LOW
WBC: 2.3 — ABNORMAL LOW

## 2011-09-08 LAB — BASIC METABOLIC PANEL
BUN: 4 — ABNORMAL LOW
BUN: 4 — ABNORMAL LOW
BUN: 6
CO2: 24
Calcium: 9.5
Chloride: 103
Creatinine, Ser: 0.79
Creatinine, Ser: 0.88
GFR calc non Af Amer: >60
GFR calc non Af Amer: >60
Glucose, Bld: 87
Glucose, Bld: 91
Potassium: 4.1
Potassium: 4.3

## 2011-09-08 LAB — CK TOTAL AND CKMB (NOT AT ARMC)
CK, MB: 5.3 — ABNORMAL HIGH
Relative Index: 4.6 — ABNORMAL HIGH
Total CK: 103
Total CK: 116

## 2011-09-08 LAB — CARDIAC PANEL(CRET KIN+CKTOT+MB+TROPI)
CK, MB: 2.4
Relative Index: 2.3

## 2011-09-08 LAB — URINALYSIS, ROUTINE W REFLEX MICROSCOPIC
Glucose, UA: NEGATIVE
Hgb urine dipstick: NEGATIVE
Ketones, ur: NEGATIVE
Protein, ur: NEGATIVE

## 2011-09-08 LAB — I-STAT 8, (EC8 V) (CONVERTED LAB)
Acid-Base Excess: 2
Glucose, Bld: 123 — ABNORMAL HIGH
HCT: 50 — ABNORMAL HIGH
Hemoglobin: 17 — ABNORMAL HIGH
Operator id: 265201
Potassium: 4.3
Sodium: 116 — CL
TCO2: 26

## 2011-09-08 LAB — TROPONIN I: Troponin I: 0.03

## 2011-09-08 LAB — OSMOLALITY: Osmolality: 250 — ABNORMAL LOW

## 2011-09-08 LAB — METANEPHRINES, URINE, 24 HOUR
Metaneph Total, Ur: 1122 mcg/24 h — ABNORMAL HIGH (ref 224–832)
Metanephrines, Ur: 257 mcg/24 h (ref 90–315)
Normetanephrine, 24H Ur: 865 mcg/24 h — ABNORMAL HIGH (ref 122–676)
Volume, Urine-METAN: 2700

## 2011-09-08 LAB — URINE MICROSCOPIC-ADD ON

## 2011-09-08 LAB — POCT I-STAT CREATININE
Creatinine, Ser: 0.9
Operator id: 265201

## 2011-09-08 LAB — B-NATRIURETIC PEPTIDE (CONVERTED LAB): Pro B Natriuretic peptide (BNP): 162 — ABNORMAL HIGH

## 2011-09-08 LAB — DIFFERENTIAL
Eosinophils Relative: 0
Lymphocytes Relative: 29
Lymphs Abs: 0.7

## 2011-09-29 LAB — CLOSTRIDIUM DIFFICILE EIA
C difficile Toxins A+B, EIA: NEGATIVE
C difficile Toxins A+B, EIA: NEGATIVE
C difficile Toxins A+B, EIA: NEGATIVE

## 2011-09-29 LAB — DIFFERENTIAL
Basophils Absolute: 0
Basophils Relative: 1
Eosinophils Absolute: 0
Eosinophils Absolute: 0
Eosinophils Relative: 2
Eosinophils Relative: 7 — ABNORMAL HIGH
Lymphocytes Relative: 27
Lymphocytes Relative: 35
Lymphs Abs: 0.9
Monocytes Absolute: 0.2
Monocytes Absolute: 0.3
Monocytes Absolute: 0.3
Neutrophils Relative %: 43
Neutrophils Relative %: 54

## 2011-09-29 LAB — GIARDIA/CRYPTOSPORIDIUM SCREEN(EIA)
Cryptosporidium Screen (EIA): NEGATIVE
Giardia Screen - EIA: NEGATIVE

## 2011-09-29 LAB — COMPREHENSIVE METABOLIC PANEL
AST: 48 — ABNORMAL HIGH
Albumin: 2.4 — ABNORMAL LOW
Alkaline Phosphatase: 50
Chloride: 108
Creatinine, Ser: 0.65
GFR calc Af Amer: >60
Potassium: 3.7
Total Bilirubin: 0.6

## 2011-09-29 LAB — CARDIAC PANEL(CRET KIN+CKTOT+MB+TROPI)
CK, MB: 5 — ABNORMAL HIGH
Relative Index: 3.4 — ABNORMAL HIGH
Relative Index: 3.8 — ABNORMAL HIGH
Total CK: 125
Total CK: 145
Troponin I: 0.03

## 2011-09-29 LAB — HEPATIC FUNCTION PANEL
AST: 68 — ABNORMAL HIGH
Albumin: 3 — ABNORMAL LOW
Total Bilirubin: 0.9
Total Protein: 8.4 — ABNORMAL HIGH

## 2011-09-29 LAB — BASIC METABOLIC PANEL
BUN: 1 — ABNORMAL LOW
BUN: 4 — ABNORMAL LOW
CO2: 24
CO2: 25
Calcium: 7.6 — ABNORMAL LOW
Chloride: 112
Chloride: 94 — ABNORMAL LOW
Creatinine, Ser: 0.73
Creatinine, Ser: 0.74
GFR calc Af Amer: >60
GFR calc Af Amer: >60
GFR calc non Af Amer: >60
Glucose, Bld: 109 — ABNORMAL HIGH
Glucose, Bld: 118 — ABNORMAL HIGH
Potassium: 2.8 — ABNORMAL LOW
Sodium: 128 — ABNORMAL LOW

## 2011-09-29 LAB — CBC
MCHC: 34.5
MCV: 92.2
MCV: 97.3
Platelets: 105 — ABNORMAL LOW
Platelets: 112 — ABNORMAL LOW
Platelets: 139 — ABNORMAL LOW
RBC: 3.63 — ABNORMAL LOW
RDW: 15.4 — ABNORMAL HIGH
WBC: 1.8 — ABNORMAL LOW
WBC: 2.1 — ABNORMAL LOW
WBC: 2.5 — ABNORMAL LOW

## 2011-09-29 LAB — MAGNESIUM: Magnesium: 1 — ABNORMAL LOW

## 2011-09-29 LAB — STOOL CULTURE

## 2011-09-29 LAB — CK TOTAL AND CKMB (NOT AT ARMC): Relative Index: 2.8 — ABNORMAL HIGH

## 2011-09-29 LAB — OCCULT BLOOD X 1 CARD TO LAB, STOOL: Fecal Occult Bld: NEGATIVE

## 2011-09-29 LAB — POTASSIUM: Potassium: 3.2 — ABNORMAL LOW

## 2011-11-02 ENCOUNTER — Other Ambulatory Visit: Payer: Self-pay | Admitting: Internal Medicine

## 2011-11-02 DIAGNOSIS — Z1231 Encounter for screening mammogram for malignant neoplasm of breast: Secondary | ICD-10-CM

## 2011-11-30 ENCOUNTER — Ambulatory Visit (HOSPITAL_COMMUNITY)
Admission: RE | Admit: 2011-11-30 | Discharge: 2011-11-30 | Disposition: A | Discharge status: Home / Self Care | Payer: Medicare Other | Source: Ambulatory Visit | Attending: Internal Medicine | Admitting: Internal Medicine

## 2011-11-30 DIAGNOSIS — Z1231 Encounter for screening mammogram for malignant neoplasm of breast: Secondary | ICD-10-CM | POA: Insufficient documentation

## 2011-12-26 ENCOUNTER — Ambulatory Visit (INDEPENDENT_AMBULATORY_CARE_PROVIDER_SITE_OTHER): Payer: Medicare Other | Admitting: Internal Medicine

## 2011-12-26 ENCOUNTER — Encounter: Payer: Self-pay | Admitting: Internal Medicine

## 2011-12-26 ENCOUNTER — Other Ambulatory Visit (INDEPENDENT_AMBULATORY_CARE_PROVIDER_SITE_OTHER): Payer: Medicare Other

## 2011-12-26 VITALS — BP 130/80 | HR 73 | Temp 97.4°F | Ht 64.0 in | Wt 134.5 lb

## 2011-12-26 DIAGNOSIS — E785 Hyperlipidemia, unspecified: Secondary | ICD-10-CM

## 2011-12-26 DIAGNOSIS — K7030 Alcoholic cirrhosis of liver without ascites: Secondary | ICD-10-CM

## 2011-12-26 DIAGNOSIS — R5381 Other malaise: Secondary | ICD-10-CM | POA: Diagnosis not present

## 2011-12-26 DIAGNOSIS — I10 Essential (primary) hypertension: Secondary | ICD-10-CM

## 2011-12-26 DIAGNOSIS — F329 Major depressive disorder, single episode, unspecified: Secondary | ICD-10-CM

## 2011-12-26 DIAGNOSIS — R5383 Other fatigue: Secondary | ICD-10-CM

## 2011-12-26 LAB — CBC WITH DIFFERENTIAL/PLATELET
Basophils Relative: 0.4 % (ref 0.0–3.0)
Eosinophils Relative: 7.2 % — ABNORMAL HIGH (ref 0.0–5.0)
Lymphocytes Relative: 21.9 % (ref 12.0–46.0)
MCV: 88.6 fl (ref 78.0–100.0)
Monocytes Relative: 11.6 % (ref 3.0–12.0)
Neutrophils Relative %: 58.9 % (ref 43.0–77.0)
RBC: 3.89 Mil/uL (ref 3.87–5.11)
WBC: 4.6 10*3/uL (ref 4.5–10.5)

## 2011-12-26 LAB — URINALYSIS, ROUTINE W REFLEX MICROSCOPIC
Leukocytes, UA: NEGATIVE
Nitrite: NEGATIVE
Specific Gravity, Urine: 1.01 (ref 1.000–1.030)
Total Protein, Urine: NEGATIVE
pH: 6.5 (ref 5.0–8.0)

## 2011-12-26 LAB — BASIC METABOLIC PANEL
BUN: 17 mg/dL (ref 6–23)
Chloride: 100 mEq/L (ref 96–112)
Creatinine, Ser: 1.3 mg/dL — ABNORMAL HIGH (ref 0.4–1.2)
Glucose, Bld: 97 mg/dL (ref 70–99)

## 2011-12-26 LAB — HEPATIC FUNCTION PANEL
ALT: 17 U/L (ref 0–35)
Albumin: 3.8 g/dL (ref 3.5–5.2)
Total Bilirubin: 0.5 mg/dL (ref 0.3–1.2)
Total Protein: 8.3 g/dL (ref 6.0–8.3)

## 2011-12-26 LAB — LIPID PANEL
Total CHOL/HDL Ratio: 2
VLDL: 17.2 mg/dL (ref 0.0–40.0)

## 2011-12-26 MED ORDER — LACTULOSE ENCEPHALOPATHY 10 GM/15ML PO SOLN: 20.0000 g | Freq: Every day | ORAL | Status: DC

## 2011-12-26 MED ORDER — SPIRONOLACTONE 50 MG PO TABS: 50.0000 mg | ORAL_TABLET | Freq: Every day | ORAL | Status: DC

## 2011-12-26 MED ORDER — ALPRAZOLAM 0.5 MG PO TABS: 0.5000 mg | ORAL_TABLET | Freq: Every evening | ORAL | Status: DC | PRN

## 2011-12-26 MED ORDER — CLONIDINE HCL 0.2 MG/24HR TD PTWK: 1.0000 | MEDICATED_PATCH | TRANSDERMAL | Status: DC

## 2011-12-26 NOTE — Progress Notes (Signed)
Subjective:    Patient ID: Sarah Harmon, female    DOB: 1946-04-08, 66 y.o.   MRN: [id number]  HPI  Here to f/u; overall doing ok,  Pt denies chest pain, increased sob or doe, wheezing, orthopnea, PND, increased LE swelling, palpitations, dizziness or syncope.  Pt denies new neurological symptoms such as new headache, or facial or extremity weakness or numbness   Pt denies polydipsia, polyuria, or low sugar symptoms such as weakness or confusion improved with po intake.  Pt states overall good compliance with meds, trying to follow lower cholesterol diet, wt overall stable but little exercise however. Denies worsening depressive symptoms, suicidal ideation, or panic, though has ongoing anxiety.  Does have sense of ongoing fatigue, but denies signficant hypersomnolence.  Declines flu shot today Past Medical History  Diagnosis Date  . ALCOHOL ABUSE 07/13/2010  . ANXIETY 12/22/2009  . DEPRESSION 12/22/2009  . 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 FATIGUE 12/23/2010  . GALLSTONES 12/22/2009  . HYPERLIPIDEMIA 12/22/2009  . HYPERTENSION 12/22/2009  . HYPONATREMIA 12/22/2009  . 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 OSTEOPENIA 12/22/2009  . Pancytopenia 07/13/2010  . RENAL CYST, LEFT 12/22/2009  . VITAMIN D DEFICIENCY 12/23/2010  . WEIGHT LOSS 12/23/2010  . DJD (degenerative joint disease), cervical   . Renal cyst   . Gallstones    No past surgical history on file.  reports that she has never smoked. She does not have any smokeless tobacco history on file. She reports that she drinks alcohol. She reports that she does not use illicit drugs. family history includes Breast cancer in her sister. No Known Allergies Current Outpatient Prescriptions on File Prior to Visit  Medication Sig Dispense Refill  . alendronate (FOSAMAX) 70 MG tablet Take 70 mg by mouth every 7 (seven) days. Take with a full glass of water on an empty stomach.       . Calcium Carbonate-Vitamin D (CALTRATE 600+D) 600-400 MG-UNIT per  tablet Take 1 tablet by mouth daily.        . Cholecalciferol (VITAMIN D3) 1000 UNITS CAPS Take by mouth daily.        . citalopram (CELEXA) 10 MG tablet Take 10 mg by mouth daily.        . folic acid (FOLVITE) 1 MG tablet Take 1 mg by mouth daily.        . Multiple Vitamin (MULTIVITAMIN) tablet Take 1 tablet by mouth daily.        . propranolol (INDERAL) 10 MG tablet Take 10 mg by mouth 2 (two) times daily.        . Thiamine HCl (VITAMIN B-1) 100 MG tablet Take 100 mg by mouth daily.         Review of Systems Review of Systems  Constitutional: Negative for diaphoresis and unexpected weight change.  HENT: Negative for drooling and tinnitus.   Eyes: Negative for photophobia and visual disturbance.  Respiratory: Negative for choking and stridor.   Gastrointestinal: Negative for vomiting and blood in stool.  Genitourinary: Negative for hematuria and decreased urine volume.     Objective:   Physical Exam BP 130/80  Pulse 73  Temp(Src) 97.4 F (36.3 C) (Oral)  Ht 5\' 4"  (1.626 m)  Wt 134 lb 8 oz (61.009 kg)  BMI 23.09 kg/m2  SpO2 99% Physical Exam  VS noted Constitutional: Pt appears well-developed and well-nourished.  HENT: Head: Normocephalic.  Right Ear: External ear normal.  Left Ear: External ear normal.  Eyes: Conjunctivae and EOM are  normal. Pupils are equal, round, and reactive to light.  Neck: Normal range of motion. Neck supple.  Cardiovascular: Normal rate and regular rhythm.   Pulmonary/Chest: Effort normal and breath sounds normal.  Abd:  Soft, NT, non-distended, + BS Neurological: Pt is alert. No cranial nerve deficit.  Skin: Skin is warm. No erythema.  Psychiatric: Pt behavior is normal. Thought content normal. 1+ nervous    Assessment & Plan:

## 2011-12-26 NOTE — Patient Instructions (Addendum)
Please only take one of the aldactone (spironolactone) pills in the AM only (no PM pill) Continue all other medications as before, including the 30 ml per day of the lactulose Please go to LAB in the Basement for the blood and/or urine tests to be done today Please call the phone number [phone] (the [facility]) for results of testing in 2-3 days;  When calling, simply dial the number, and when prompted enter the MRN number above (the Medical Record Number) and the # key, then the message should start. Your medications were refilled as requested Please have the pharmacy call if you need further other refills Please return in 6 months, or sooner if needed

## 2011-12-31 ENCOUNTER — Encounter: Payer: Self-pay | Admitting: Internal Medicine

## 2011-12-31 NOTE — Assessment & Plan Note (Signed)
For ammonia level today, stable overall by hx and exam, most recent data reviewed with pt, and pt to continue medical treatment as before

## 2011-12-31 NOTE — Assessment & Plan Note (Signed)
stable overall by hx and exam, most recent data reviewed with pt, and pt to continue medical treatment as before  Lab Results  Component Value Date   WBC 4.6 12/26/2011   HGB 11.6* 12/26/2011   HCT 34.4* 12/26/2011   PLT 140.0* 12/26/2011   GLUCOSE 97 12/26/2011   CHOL 148 12/26/2011   TRIG 86.0 12/26/2011   HDL 73.20 12/26/2011   LDLCALC 58 12/26/2011   ALT 17 12/26/2011   AST 27 12/26/2011   NA 133* 12/26/2011   K 5.1 12/26/2011   CL 100 12/26/2011   CREATININE 1.3* 12/26/2011   BUN 17 12/26/2011   CO2 27 12/26/2011   TSH 2.12 12/26/2011   INR 1.41 06/05/2010

## 2011-12-31 NOTE — Assessment & Plan Note (Signed)
stable overall by hx and exam, most recent data reviewed with pt, and pt to continue medical treatment as before  BP Readings from Last 3 Encounters:  12/26/11 130/80  06/27/11 122/80  12/23/10 110/70

## 2011-12-31 NOTE — Assessment & Plan Note (Signed)
Etiology unclear, Exam otherwise benign, to check labs as documented, follow with expectant management  

## 2011-12-31 NOTE — Assessment & Plan Note (Signed)
stable overall by hx and exam, most recent data reviewed with pt, and pt to continue medical treatment as before  Lab Results  Component Value Date   LDLCALC 58 12/26/2011

## 2012-01-01 ENCOUNTER — Other Ambulatory Visit: Payer: Self-pay | Admitting: Internal Medicine

## 2012-02-13 ENCOUNTER — Other Ambulatory Visit: Payer: Self-pay | Admitting: Internal Medicine

## 2012-03-04 DIAGNOSIS — H2510 Age-related nuclear cataract, unspecified eye: Secondary | ICD-10-CM | POA: Diagnosis not present

## 2012-03-04 DIAGNOSIS — H04129 Dry eye syndrome of unspecified lacrimal gland: Secondary | ICD-10-CM | POA: Diagnosis not present

## 2012-03-04 DIAGNOSIS — H35039 Hypertensive retinopathy, unspecified eye: Secondary | ICD-10-CM | POA: Diagnosis not present

## 2012-03-04 DIAGNOSIS — H35369 Drusen (degenerative) of macula, unspecified eye: Secondary | ICD-10-CM | POA: Diagnosis not present

## 2012-03-04 DIAGNOSIS — H40029 Open angle with borderline findings, high risk, unspecified eye: Secondary | ICD-10-CM | POA: Diagnosis not present

## 2012-06-13 DIAGNOSIS — H40029 Open angle with borderline findings, high risk, unspecified eye: Secondary | ICD-10-CM | POA: Diagnosis not present

## 2012-06-25 ENCOUNTER — Encounter: Payer: Self-pay | Admitting: Internal Medicine

## 2012-06-25 ENCOUNTER — Ambulatory Visit (INDEPENDENT_AMBULATORY_CARE_PROVIDER_SITE_OTHER): Payer: Medicare Other | Admitting: Internal Medicine

## 2012-06-25 ENCOUNTER — Other Ambulatory Visit: Payer: Self-pay

## 2012-06-25 ENCOUNTER — Other Ambulatory Visit (INDEPENDENT_AMBULATORY_CARE_PROVIDER_SITE_OTHER): Payer: Medicare Other

## 2012-06-25 VITALS — BP 150/94 | HR 78 | Temp 98.5°F | Ht 64.0 in | Wt 149.8 lb

## 2012-06-25 DIAGNOSIS — I10 Essential (primary) hypertension: Secondary | ICD-10-CM | POA: Diagnosis not present

## 2012-06-25 DIAGNOSIS — F329 Major depressive disorder, single episode, unspecified: Secondary | ICD-10-CM | POA: Diagnosis not present

## 2012-06-25 DIAGNOSIS — E785 Hyperlipidemia, unspecified: Secondary | ICD-10-CM | POA: Diagnosis not present

## 2012-06-25 LAB — CBC WITH DIFFERENTIAL/PLATELET
Basophils Absolute: 0 10*3/uL (ref 0.0–0.1)
Basophils Relative: 0.5 % (ref 0.0–3.0)
Eosinophils Absolute: 0.2 10*3/uL (ref 0.0–0.7)
HCT: 36 % (ref 36.0–46.0)
Hemoglobin: 12 g/dL (ref 12.0–15.0)
Lymphocytes Relative: 18.2 % (ref 12.0–46.0)
Lymphs Abs: 0.7 10*3/uL (ref 0.7–4.0)
MCHC: 33.2 g/dL (ref 30.0–36.0)
MCV: 95.9 fl (ref 78.0–100.0)
Monocytes Absolute: 0.6 10*3/uL (ref 0.1–1.0)
Neutro Abs: 2.2 10*3/uL (ref 1.4–7.7)
RBC: 3.75 Mil/uL — ABNORMAL LOW (ref 3.87–5.11)
RDW: 15.3 % — ABNORMAL HIGH (ref 11.5–14.6)

## 2012-06-25 LAB — HEPATIC FUNCTION PANEL
Alkaline Phosphatase: 50 U/L (ref 39–117)
Bilirubin, Direct: 0.1 mg/dL (ref 0.0–0.3)
Total Bilirubin: 0.5 mg/dL (ref 0.3–1.2)
Total Protein: 8.1 g/dL (ref 6.0–8.3)

## 2012-06-25 LAB — BASIC METABOLIC PANEL
CO2: 27 mEq/L (ref 19–32)
Calcium: 10 mg/dL (ref 8.4–10.5)
Chloride: 95 mEq/L — ABNORMAL LOW (ref 96–112)
Sodium: 129 mEq/L — ABNORMAL LOW (ref 135–145)

## 2012-06-25 LAB — LIPID PANEL: Total CHOL/HDL Ratio: 2

## 2012-06-25 MED ORDER — CLONIDINE HCL 0.2 MG/24HR TD PTWK: 1.0000 | MEDICATED_PATCH | TRANSDERMAL | Status: DC

## 2012-06-25 MED ORDER — ALPRAZOLAM 0.5 MG PO TABS: 0.5000 mg | ORAL_TABLET | Freq: Every evening | ORAL | Status: DC | PRN

## 2012-06-25 MED ORDER — CITALOPRAM HYDROBROMIDE 10 MG PO TABS: 10.0000 mg | ORAL_TABLET | Freq: Every day | ORAL | Status: DC

## 2012-06-25 MED ORDER — ALENDRONATE SODIUM 70 MG PO TABS: 70.0000 mg | ORAL_TABLET | ORAL | Status: DC

## 2012-06-25 MED ORDER — SPIRONOLACTONE 50 MG PO TABS: 50.0000 mg | ORAL_TABLET | Freq: Every day | ORAL | Status: DC

## 2012-06-25 MED ORDER — LACTULOSE ENCEPHALOPATHY 10 GM/15ML PO SOLN: 20.0000 g | Freq: Every day | ORAL | Status: DC

## 2012-06-25 MED ORDER — PROPRANOLOL HCL 10 MG PO TABS: 10.0000 mg | ORAL_TABLET | Freq: Two times a day (BID) | ORAL | Status: DC

## 2012-06-25 MED ORDER — FOLIC ACID 1 MG PO TABS: 1.0000 mg | ORAL_TABLET | Freq: Every day | ORAL | Status: DC

## 2012-06-25 NOTE — Patient Instructions (Addendum)
Please stop ensure and any nutritional supplements with calories The current medical regimen is effective;  continue present plan and medications. Please continue your efforts at being more active, low cholesterol diet, and weight control Please continue to monitor your Blood Pressure on a regular basis;  If the "top number" is more than 140 more often than not, you may need further medicine treatement Please have the pharmacy call with any refills you may need. Please go to LAB in the Basement for the blood and/or urine tests to be done today You will be contacted by phone if any changes need to be made immediately.  Otherwise, you will receive a letter about your results with an explanation. Please return in 6 mo with Lab testing done 3-5 days before

## 2012-06-25 NOTE — Telephone Encounter (Signed)
Faxed hardcopy to pharmacy. 

## 2012-06-25 NOTE — Telephone Encounter (Signed)
Please advise on Xanax refill.   *Please inform pt's daughter that refills of maintenance medication have already been sent to pharmacy*

## 2012-06-25 NOTE — Assessment & Plan Note (Signed)
stable overall by hx and exam, most recent data reviewed with pt, and pt to continue medical treatment as before Lab Results  Component Value Date   WBC 4.6 12/26/2011   HGB 11.6* 12/26/2011   HCT 34.4* 12/26/2011   PLT 140.0* 12/26/2011   GLUCOSE 97 12/26/2011   CHOL 148 12/26/2011   TRIG 86.0 12/26/2011   HDL 73.20 12/26/2011   LDLCALC 58 12/26/2011   ALT 17 12/26/2011   AST 27 12/26/2011   NA 133* 12/26/2011   K 5.1 12/26/2011   CL 100 12/26/2011   CREATININE 1.3* 12/26/2011   BUN 17 12/26/2011   CO2 27 12/26/2011   TSH 2.12 12/26/2011   INR 1.41 06/05/2010

## 2012-06-25 NOTE — Telephone Encounter (Signed)
Done hardcopy to [facility]  

## 2012-06-25 NOTE — Assessment & Plan Note (Signed)
Mild increased today, unclear if persistent elevated but may be as she has gained wt, to stop the ensure, and cont to monitor BP at home and drug stores, o/w stable overall by hx and exam, most recent data reviewed with pt, and pt to continue medical treatment as before - pt declines change today BP Readings from Last 3 Encounters:  06/25/12 150/94  12/26/11 130/80  06/27/11 122/80

## 2012-06-25 NOTE — Assessment & Plan Note (Signed)
stable overall by hx and exam, most recent data reviewed with pt, and pt to continue medical treatment as before Lab Results  Component Value Date   LDLCALC 58 12/26/2011

## 2012-06-25 NOTE — Progress Notes (Signed)
Subjective:    Patient ID: Sarah Harmon, female    DOB: 1946/10/08, 66 y.o.   MRN: [id number]  HPI  Here with caretaker who is quite diligent at helping with meds;  Pt has cont'd regular ensure use, and unfortunately has gained 15 lbs since last visit;  Otherwise overall doing ok,  Pt denies chest pain, increased sob or doe, wheezing, orthopnea, PND, increased LE swelling, palpitations, dizziness or syncope.  Pt denies new neurological symptoms such as new headache, or facial or extremity weakness or numbness   Pt denies polydipsia, polyuria, or low sugar symptoms such as weakness or confusion improved with po intake.  Pt states overall good compliance with meds, trying to follow lower cholesterol, diabetic diet, wt overall stable but little exercise however. Past Medical History  Diagnosis Date  . ALCOHOL ABUSE 07/13/2010  . ANXIETY 12/22/2009  . DEPRESSION 12/22/2009  . 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 FATIGUE 12/23/2010  . GALLSTONES 12/22/2009  . HYPERLIPIDEMIA 12/22/2009  . HYPERTENSION 12/22/2009  . HYPONATREMIA 12/22/2009  . 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 OSTEOPENIA 12/22/2009  . Pancytopenia 07/13/2010  . RENAL CYST, LEFT 12/22/2009  . VITAMIN D DEFICIENCY 12/23/2010  . WEIGHT LOSS 12/23/2010  . DJD (degenerative joint disease), cervical   . Renal cyst   . Gallstones    No past surgical history on file.  reports that she has never smoked. She does not have any smokeless tobacco history on file. She reports that she drinks alcohol. She reports that she does not use illicit drugs. family history includes Breast cancer in her sister. No Known Allergies Current Outpatient Prescriptions on File Prior to Visit  Medication Sig Dispense Refill  . alendronate (FOSAMAX) 70 MG tablet TAKE ONE TABLET BY MOUTH ONCE A WEEK  12 tablet  1  . ALPRAZolam (XANAX) 0.5 MG tablet Take 1 tablet (0.5 mg total) by mouth at bedtime as needed.  30 tablet  5  . Calcium Carbonate-Vitamin D (CALTRATE  600+D) 600-400 MG-UNIT per tablet Take 1 tablet by mouth daily.        . Cholecalciferol (VITAMIN D3) 1000 UNITS CAPS Take by mouth daily.        . citalopram (CELEXA) 10 MG tablet TAKE ONE TABLET BY MOUTH EVERY DAY  90 tablet  1  . cloNIDine (CATAPRES - DOSED IN MG/24 HR) 0.2 mg/24hr patch Place 1 patch (0.2 mg total) onto the skin once a week.  4 patch  11  . folic acid (FOLVITE) 1 MG tablet TAKE ONE TABLET BY MOUTH EVERY DAY  90 tablet  1  . lactulose, encephalopathy, (GENERLAC) 10 GM/15ML SOLN Take 30 mLs (20 g total) by mouth daily.  2700 mL  5  . Multiple Vitamin (MULTIVITAMIN) tablet Take 1 tablet by mouth daily.        . propranolol (INDERAL) 10 MG tablet TAKE ONE TABLET BY MOUTH TWICE DAILY  180 tablet  1  . spironolactone (ALDACTONE) 50 MG tablet Take 1 tablet (50 mg total) by mouth daily.  90 tablet  3  . Thiamine HCl (VITAMIN B-1) 100 MG tablet Take 100 mg by mouth daily.         Review of Systems Review of Systems  Constitutional: Negative for diaphoresis or night sweats Respiratory: Negative for choking and stridor.   Gastrointestinal: Negative for vomiting and blood in stool.  Genitourinary: Negative for hematuria and decreased urine volume.  Musculoskeletal: Negative for gait problem.  Skin: Negative for color change and  wound.  Neurological: Negative for tremors and numbness.  Psychiatric/Behavioral: Negative for decreased concentration. The patient is not hyperactive.      Objective:   Physical Exam BP 150/94  Pulse 78  Temp 98.5 F (36.9 C) (Oral)  Ht 5\' 4"  (1.626 m)  Wt 149 lb 12 oz (67.926 kg)  BMI 25.70 kg/m2  SpO2 98% Physical Exam  VS noted Constitutional: Pt appears well-developed and well-nourished.  HENT: Head: Normocephalic.  Right Ear: External ear normal.  Left Ear: External ear normal.  Eyes: Conjunctivae and EOM are normal. Pupils are equal, round, and reactive to light.  Neck: Normal range of motion. Neck supple.  Cardiovascular: Normal rate and  regular rhythm.   Pulmonary/Chest: Effort normal and breath sounds normal.  Abd:  Soft, NT, non-distended, + BS Neurological: Pt is alert. Motor/gait intact Skin: Skin is warm. No erythema. No rash or juandice Psychiatric: Pt behavior is normal. Not depressed affect, 1+ nervous    Assessment & Plan:

## 2012-11-20 ENCOUNTER — Other Ambulatory Visit: Payer: Self-pay | Admitting: Internal Medicine

## 2012-11-20 DIAGNOSIS — Z1231 Encounter for screening mammogram for malignant neoplasm of breast: Secondary | ICD-10-CM

## 2012-12-10 ENCOUNTER — Ambulatory Visit (HOSPITAL_COMMUNITY)
Admission: RE | Admit: 2012-12-10 | Discharge: 2012-12-10 | Disposition: A | Discharge status: Home / Self Care | Payer: Medicare Other | Source: Ambulatory Visit | Attending: Internal Medicine | Admitting: Internal Medicine

## 2012-12-10 DIAGNOSIS — Z1231 Encounter for screening mammogram for malignant neoplasm of breast: Secondary | ICD-10-CM | POA: Insufficient documentation

## 2012-12-15 ENCOUNTER — Other Ambulatory Visit: Payer: Self-pay | Admitting: Internal Medicine

## 2012-12-20 ENCOUNTER — Other Ambulatory Visit: Payer: Self-pay | Admitting: Internal Medicine

## 2012-12-20 NOTE — Telephone Encounter (Signed)
Done hardcopy to [facility]  

## 2012-12-20 NOTE — Telephone Encounter (Signed)
Faxed hardcopy to pharmacy. 

## 2012-12-25 LAB — HM MAMMOGRAPHY: HM Mammogram: NORMAL

## 2012-12-27 ENCOUNTER — Ambulatory Visit (INDEPENDENT_AMBULATORY_CARE_PROVIDER_SITE_OTHER): Payer: Medicare Other | Admitting: Internal Medicine

## 2012-12-27 ENCOUNTER — Encounter: Payer: Self-pay | Admitting: Internal Medicine

## 2012-12-27 ENCOUNTER — Other Ambulatory Visit (INDEPENDENT_AMBULATORY_CARE_PROVIDER_SITE_OTHER): Payer: Medicare Other

## 2012-12-27 VITALS — BP 120/82 | HR 89 | Temp 97.1°F | Ht 63.0 in | Wt 152.0 lb

## 2012-12-27 DIAGNOSIS — K7030 Alcoholic cirrhosis of liver without ascites: Secondary | ICD-10-CM | POA: Diagnosis not present

## 2012-12-27 DIAGNOSIS — E785 Hyperlipidemia, unspecified: Secondary | ICD-10-CM | POA: Diagnosis not present

## 2012-12-27 DIAGNOSIS — I10 Essential (primary) hypertension: Secondary | ICD-10-CM | POA: Diagnosis not present

## 2012-12-27 DIAGNOSIS — F1010 Alcohol abuse, uncomplicated: Secondary | ICD-10-CM

## 2012-12-27 DIAGNOSIS — R7309 Other abnormal glucose: Secondary | ICD-10-CM | POA: Diagnosis not present

## 2012-12-27 DIAGNOSIS — R7302 Impaired glucose tolerance (oral): Secondary | ICD-10-CM

## 2012-12-27 LAB — HEPATIC FUNCTION PANEL
ALT: 22 U/L (ref 0–35)
AST: 28 U/L (ref 0–37)
Albumin: 3.3 g/dL — ABNORMAL LOW (ref 3.5–5.2)
Total Bilirubin: 0.5 mg/dL (ref 0.3–1.2)

## 2012-12-27 LAB — CBC WITH DIFFERENTIAL/PLATELET
Basophils Relative: 0.4 % (ref 0.0–3.0)
Eosinophils Relative: 13.1 % — ABNORMAL HIGH (ref 0.0–5.0)
Hemoglobin: 13 g/dL (ref 12.0–15.0)
Lymphocytes Relative: 22.8 % (ref 12.0–46.0)
MCHC: 33.2 g/dL (ref 30.0–36.0)
MCV: 92.2 fl (ref 78.0–100.0)
Monocytes Absolute: 0.3 10*3/uL (ref 0.1–1.0)
Neutro Abs: 2.2 10*3/uL (ref 1.4–7.7)
Neutrophils Relative %: 57.2 % (ref 43.0–77.0)
RBC: 4.24 Mil/uL (ref 3.87–5.11)
WBC: 3.9 10*3/uL — ABNORMAL LOW (ref 4.5–10.5)

## 2012-12-27 LAB — BASIC METABOLIC PANEL
Calcium: 10 mg/dL (ref 8.4–10.5)
GFR: 60.93 mL/min (ref >60.00)
Potassium: 4 mEq/L (ref 3.5–5.1)
Sodium: 136 mEq/L (ref 135–145)

## 2012-12-27 MED ORDER — SPIRONOLACTONE 50 MG PO TABS: 50.0000 mg | ORAL_TABLET | Freq: Every day | ORAL | Status: DC

## 2012-12-27 MED ORDER — PROPRANOLOL HCL 10 MG PO TABS: 10.0000 mg | ORAL_TABLET | Freq: Two times a day (BID) | ORAL | Status: DC

## 2012-12-27 MED ORDER — CITALOPRAM HYDROBROMIDE 10 MG PO TABS: 10.0000 mg | ORAL_TABLET | Freq: Every day | ORAL | Status: DC

## 2012-12-27 MED ORDER — FOLIC ACID 1 MG PO TABS: 1.0000 mg | ORAL_TABLET | Freq: Every day | ORAL | Status: DC

## 2012-12-27 MED ORDER — ALENDRONATE SODIUM 70 MG PO TABS: 70.0000 mg | ORAL_TABLET | ORAL | Status: DC

## 2012-12-27 MED ORDER — CLONIDINE HCL 0.2 MG/24HR TD PTWK: 1.0000 | MEDICATED_PATCH | TRANSDERMAL | Status: DC

## 2012-12-27 MED ORDER — VITAMIN B-1 100 MG PO TABS: 100.0000 mg | ORAL_TABLET | Freq: Every day | ORAL | Status: DC

## 2012-12-27 MED ORDER — ALPRAZOLAM 0.5 MG PO TABS: 0.5000 mg | ORAL_TABLET | Freq: Every evening | ORAL | Status: DC | PRN

## 2012-12-27 NOTE — Assessment & Plan Note (Signed)
stable overall by hx and exam, most recent data reviewed with pt, and pt to continue medical treatment as before, has been drinking some ETOH recently, advised to quit, for LFT's, INR today and BMET on the aldactone

## 2012-12-27 NOTE — Assessment & Plan Note (Addendum)
ECG reviewed as per emr, stable overall by history and exam, recent data reviewed with pt, and pt to continue medical treatment as before,  to f/u any worsening symptoms or concerns Lab Results  Component Value Date   WBC 3.9* 12/27/2012   HGB 13.0 12/27/2012   HCT 39.1 12/27/2012   PLT 141.0* 12/27/2012   GLUCOSE 100* 12/27/2012   CHOL 142 06/25/2012   TRIG 91.0 06/25/2012   HDL 81.30 06/25/2012   LDLCALC 43 06/25/2012   ALT 22 12/27/2012   AST 28 12/27/2012   NA 136 12/27/2012   K 4.0 12/27/2012   CL 100 12/27/2012   CREATININE 1.1 12/27/2012   BUN 7 12/27/2012   CO2 29 12/27/2012   TSH 2.12 12/26/2011   INR 1.41 06/05/2010   HGBA1C 6.5 12/27/2012

## 2012-12-27 NOTE — Patient Instructions (Addendum)
Your EKG was OK today Continue all other medications as before Please go to LAB in the Basement for the blood and/or urine tests to be done today You are given the refills hardcopy today Please return if you want the flu shot You will be contacted by phone if any changes need to be made immediately after your blood tests area done.  Otherwise, you will receive a letter about your results with an explanation, but please check with MyChart first. Thank you for enrolling in [facility]. Please follow the instructions below to securely access your online medical record. MyChart allows you to send messages to your doctor, view your test results, renew your prescriptions, schedule appointments, and more. To Log into MyChart, please go to [email or web address], and your Username is: [email or web address] Please return in 6 months, or sooner if needed

## 2012-12-28 ENCOUNTER — Encounter: Payer: Self-pay | Admitting: Internal Medicine

## 2012-12-28 NOTE — Assessment & Plan Note (Signed)
stable overall by history and exam, recent data reviewed with pt, and pt to continue medical treatment as before,  to f/u any worsening symptoms or concerns Lab Results  Component Value Date   HGBA1C 6.5 12/27/2012

## 2012-12-28 NOTE — Assessment & Plan Note (Signed)
stable overall by history and exam, recent data reviewed with pt, and pt to continue medical treatment as before,  to f/u any worsening symptoms or concerns Lab Results  Component Value Date   LDLCALC 43 06/25/2012

## 2012-12-28 NOTE — Progress Notes (Signed)
Subjective:    Patient ID: Sarah Harmon, female    DOB: 03-Mar-1946, 67 y.o.   MRN: [id number]  HPI  Here to f/u with her friend as before who has POA; overall doing ok,   Pt denies chest pain, increased sob or doe, wheezing, orthopnea, PND, increased LE swelling, palpitations, dizziness or syncope.  Pt denies polydipsia, polyuria, or low sugar symptoms such as weakness or confusion improved with po intake.  Pt denies new neurological symptoms such as new headache, or facial or extremity weakness or numbness.   Pt states overall good compliance with meds, has been trying to follow lower cholesterol diet, with overall stable,  but little exercise however. Pt has unfortunately been drinking some ETOH though will not say how much.  Denies worsening depressive symptoms, suicidal ideation, or panic.   Past Medical History  Diagnosis Date  . ALCOHOL ABUSE 07/13/2010  . ANXIETY 12/22/2009  . DEPRESSION 12/22/2009  . 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 FATIGUE 12/23/2010  . GALLSTONES 12/22/2009  . HYPERLIPIDEMIA 12/22/2009  . HYPERTENSION 12/22/2009  . HYPONATREMIA 12/22/2009  . 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 OSTEOPENIA 12/22/2009  . Pancytopenia 07/13/2010  . RENAL CYST, LEFT 12/22/2009  . VITAMIN D DEFICIENCY 12/23/2010  . WEIGHT LOSS 12/23/2010  . DJD (degenerative joint disease), cervical   . Renal cyst   . Gallstones    No past surgical history on file.  reports that she has never smoked. She does not have any smokeless tobacco history on file. She reports that she drinks alcohol. She reports that she does not use illicit drugs. family history includes Breast cancer in her sister. No Known Allergies Current Outpatient Prescriptions on File Prior to Visit  Medication Sig Dispense Refill  . Calcium Carbonate-Vitamin D (CALTRATE 600+D) 600-400 MG-UNIT per tablet Take 1 tablet by mouth daily.        . Cholecalciferol (VITAMIN D3) 1000 UNITS CAPS Take by mouth daily.        . citalopram (CELEXA)  10 MG tablet Take 1 tablet (10 mg total) by mouth daily.  90 tablet  3  . cloNIDine (CATAPRES - DOSED IN MG/24 HR) 0.2 mg/24hr patch Place 1 patch (0.2 mg total) onto the skin once a week.  12 patch  3  . lactulose, encephalopathy, (GENERLAC) 10 GM/15ML SOLN Take 30 mLs (20 g total) by mouth daily.  2700 mL  5  . Multiple Vitamin (MULTIVITAMIN) tablet Take 1 tablet by mouth daily.        . propranolol (INDERAL) 10 MG tablet Take 1 tablet (10 mg total) by mouth 2 (two) times daily.  180 tablet  3  . spironolactone (ALDACTONE) 50 MG tablet Take 1 tablet (50 mg total) by mouth daily.  90 tablet  3   Review of Systems  Constitutional: Negative for unexpected weight change, or unusual diaphoresis  HENT: Negative for tinnitus.   Eyes: Negative for photophobia and visual disturbance.  Respiratory: Negative for choking and stridor.   Gastrointestinal: Negative for vomiting and blood in stool.  Genitourinary: Negative for hematuria and decreased urine volume.  Musculoskeletal: Negative for acute joint swelling Skin: Negative for color change and wound.  Neurological: Negative for tremors and numbness other than noted  Psychiatric/Behavioral: Negative for decreased concentration or  hyperactivity.  '    Objective:   Physical Exam BP 120/82  Pulse 89  Temp 97.1 F (36.2 C) (Oral)  Ht 5\' 3"  (1.6 m)  Wt 152 lb (68.947 kg)  BMI 26.93 kg/m2  SpO2 96% Physical Exam  VS noted,  Constitutional: Pt appears thin but not disheveled.  HENT: Head: NCAT.  Right Ear: External ear normal.  Left Ear: External ear normal.  Eyes: Conjunctivae and EOM are normal. Pupils are equal, round, and reactive to light.  Neck: Normal range of motion. Neck supple.  Cardiovascular: Normal rate and regular rhythm.   Pulmonary/Chest: Effort normal and breath sounds normal.  Abd:  Soft, NT, non-distended, + BS Neurological: Pt is alert  Skin: Skin is warm. No erythema.  Psychiatric: Pt behavior is normal. Not depressed  affect    Assessment & Plan:

## 2012-12-28 NOTE — Assessment & Plan Note (Signed)
Encouraged complete abstinence

## 2013-02-01 ENCOUNTER — Other Ambulatory Visit: Payer: Self-pay

## 2013-03-13 DIAGNOSIS — H40029 Open angle with borderline findings, high risk, unspecified eye: Secondary | ICD-10-CM | POA: Diagnosis not present

## 2013-03-13 DIAGNOSIS — H04129 Dry eye syndrome of unspecified lacrimal gland: Secondary | ICD-10-CM | POA: Diagnosis not present

## 2013-03-13 DIAGNOSIS — H35039 Hypertensive retinopathy, unspecified eye: Secondary | ICD-10-CM | POA: Diagnosis not present

## 2013-03-13 DIAGNOSIS — H2510 Age-related nuclear cataract, unspecified eye: Secondary | ICD-10-CM | POA: Diagnosis not present

## 2013-06-24 ENCOUNTER — Other Ambulatory Visit: Payer: Self-pay | Admitting: Internal Medicine

## 2013-06-25 NOTE — Telephone Encounter (Signed)
Faxed hardcopy to [facility] 

## 2013-06-25 NOTE — Telephone Encounter (Signed)
Done hardcopy to [facility]  

## 2013-06-30 ENCOUNTER — Ambulatory Visit: Payer: Medicare Other | Admitting: Internal Medicine

## 2013-06-30 DIAGNOSIS — Z0289 Encounter for other administrative examinations: Secondary | ICD-10-CM

## 2013-07-29 ENCOUNTER — Ambulatory Visit (INDEPENDENT_AMBULATORY_CARE_PROVIDER_SITE_OTHER): Payer: Medicare Other | Admitting: Internal Medicine

## 2013-07-29 ENCOUNTER — Other Ambulatory Visit (INDEPENDENT_AMBULATORY_CARE_PROVIDER_SITE_OTHER): Payer: Medicare Other

## 2013-07-29 ENCOUNTER — Encounter: Payer: Self-pay | Admitting: Internal Medicine

## 2013-07-29 VITALS — BP 180/120 | HR 97 | Temp 97.8°F | Ht 63.0 in | Wt 152.5 lb

## 2013-07-29 DIAGNOSIS — R7309 Other abnormal glucose: Secondary | ICD-10-CM

## 2013-07-29 DIAGNOSIS — M899 Disorder of bone, unspecified: Secondary | ICD-10-CM | POA: Diagnosis not present

## 2013-07-29 DIAGNOSIS — I10 Essential (primary) hypertension: Secondary | ICD-10-CM

## 2013-07-29 DIAGNOSIS — F329 Major depressive disorder, single episode, unspecified: Secondary | ICD-10-CM

## 2013-07-29 DIAGNOSIS — E785 Hyperlipidemia, unspecified: Secondary | ICD-10-CM | POA: Diagnosis not present

## 2013-07-29 DIAGNOSIS — M949 Disorder of cartilage, unspecified: Secondary | ICD-10-CM | POA: Diagnosis not present

## 2013-07-29 DIAGNOSIS — R7302 Impaired glucose tolerance (oral): Secondary | ICD-10-CM

## 2013-07-29 DIAGNOSIS — F3289 Other specified depressive episodes: Secondary | ICD-10-CM

## 2013-07-29 DIAGNOSIS — R21 Rash and other nonspecific skin eruption: Secondary | ICD-10-CM | POA: Insufficient documentation

## 2013-07-29 LAB — URINALYSIS, ROUTINE W REFLEX MICROSCOPIC
Ketones, ur: NEGATIVE
Leukocytes, UA: NEGATIVE
RBC / HPF: NONE SEEN (ref 0–?)
Specific Gravity, Urine: <=1.005 (ref 1.000–1.030)
Urobilinogen, UA: 0.2 (ref 0.0–1.0)

## 2013-07-29 LAB — CBC WITH DIFFERENTIAL/PLATELET
Basophils Relative: 0.5 % (ref 0.0–3.0)
Eosinophils Relative: 6.4 % — ABNORMAL HIGH (ref 0.0–5.0)
Lymphocytes Relative: 22.7 % (ref 12.0–46.0)
MCV: 96.9 fl (ref 78.0–100.0)
Monocytes Absolute: 0.6 10*3/uL (ref 0.1–1.0)
Monocytes Relative: 13 % — ABNORMAL HIGH (ref 3.0–12.0)
Neutrophils Relative %: 57.4 % (ref 43.0–77.0)
Platelets: 153 10*3/uL (ref 150.0–400.0)
RBC: 4.32 Mil/uL (ref 3.87–5.11)
WBC: 4.5 10*3/uL (ref 4.5–10.5)

## 2013-07-29 LAB — LIPID PANEL
Cholesterol: 171 mg/dL (ref 0–200)
LDL Cholesterol: 77 mg/dL (ref 0–99)
Triglycerides: 92 mg/dL (ref 0.0–149.0)
VLDL: 18.4 mg/dL (ref 0.0–40.0)

## 2013-07-29 LAB — HEPATIC FUNCTION PANEL
ALT: 30 U/L (ref 0–35)
Albumin: 3.7 g/dL (ref 3.5–5.2)
Total Bilirubin: 0.5 mg/dL (ref 0.3–1.2)

## 2013-07-29 LAB — BASIC METABOLIC PANEL
BUN: 8 mg/dL (ref 6–23)
Chloride: 98 mEq/L (ref 96–112)
Creatinine, Ser: 1.1 mg/dL (ref 0.4–1.2)
GFR: 60.82 mL/min (ref >60.00)
Glucose, Bld: 88 mg/dL (ref 70–99)

## 2013-07-29 LAB — TSH: TSH: 1.35 u[IU]/mL (ref 0.35–5.50)

## 2013-07-29 LAB — HEMOGLOBIN A1C: Hgb A1c MFr Bld: 6.2 % (ref 4.6–6.5)

## 2013-07-29 MED ORDER — CITALOPRAM HYDROBROMIDE 20 MG PO TABS: 20.0000 mg | ORAL_TABLET | Freq: Every day | ORAL | Status: DC

## 2013-07-29 MED ORDER — TRIAMCINOLONE ACETONIDE 0.1 % EX CREA: TOPICAL_CREAM | Freq: Two times a day (BID) | CUTANEOUS | Status: DC

## 2013-07-29 MED ORDER — CLONIDINE HCL 0.2 MG PO TABS: 0.2000 mg | ORAL_TABLET | Freq: Two times a day (BID) | ORAL | Status: DC

## 2013-07-29 MED ORDER — PROPRANOLOL HCL 20 MG PO TABS: 20.0000 mg | ORAL_TABLET | Freq: Two times a day (BID) | ORAL | Status: DC

## 2013-07-29 NOTE — Patient Instructions (Signed)
Ok to stop the inderal (propranolol) 10 mg, and the citalopram 10 mg, and the clonidine patch Please take all new medication as prescribed - the inderal 20 mg, citalopram 20 mg, and the clonidine pills, as well as the steroid cream you can use for the swelling near the patch, as well as the eczema type rash to the right elbow as well Please continue all other medications as before, and refills have been done if requested. Please have the pharmacy call with any other refills you may need.  Please schedule the bone density test before leaving today at the scheduling desk (where you check out)   Please go to the LAB in the Basement (turn left off the elevator) for the tests to be done today You will be contacted by phone if any changes need to be made immediately.  Otherwise, you will receive a letter about your results with an explanation, but please check with MyChart first.  Please remember to sign up for My Chart if you have not done so, as this will be important to you in the future with finding out test results, communicating by private email, and scheduling acute appointments online when needed.  Please return in 6 months, or sooner if needed

## 2013-07-29 NOTE — Assessment & Plan Note (Addendum)
stable overall by history and exam, recent data reviewed with pt, and pt to continue medical treatment as before except change inderal to 20 bid, and clonidine po (d/c catapress),  to f/u any worsening symptoms or concerns BP Readings from Last 3 Encounters:  07/29/13 180/120  12/27/12 120/82  06/25/12 150/94

## 2013-07-29 NOTE — Assessment & Plan Note (Signed)
To increase citalopram to 20 mg, verified nonsuicidal, declines counseling,   to f/u any worsening symptoms or concerns   Note:  Total time for pt hx, exam, review of record with pt in the room, determination of diagnoses and plan for further eval and tx is > 40 min, with over 50% spent in coordination and counseling of patient

## 2013-07-29 NOTE — Assessment & Plan Note (Signed)
stable overall by history and exam, recent data reviewed with pt, and pt to continue medical treatment as before,  to f/u any worsening symptoms or concerns Lab Results  Component Value Date   HGBA1C 6.2 07/29/2013

## 2013-07-29 NOTE — Assessment & Plan Note (Signed)
stable overall by history and exam, recent data reviewed with pt, and pt to continue medical treatment as before,  to f/u any worsening symptoms or concerns Lab Results  Component Value Date   LDLCALC 77 07/29/2013

## 2013-07-29 NOTE — Assessment & Plan Note (Signed)
Due for dxa - for f/u exam

## 2013-07-29 NOTE — Progress Notes (Signed)
Subjective:     Patient ID: Sarah Harmon, female    DOB: 1946/06/12, 67 y.o.   MRN: [id number]  HPI  Here with family.  Has had several wks mildworsening depressive symptoms, no suicidal ideation, or panic;  Pt denies polydipsia, polyuria.  Pt denies chest pain, increased sob or doe, wheezing, orthopnea, PND, increased LE swelling, palpitations, dizziness or syncope.  Pt denies new neurological symptoms such as new headache, or facial or extremity weakness or numbness  Has some sweling and irriation in the past 2 wks to LUE at site of catapress.  Also with separate right antecub rash she cant stop scratching.  Due for dxa as well. More nervous today, BP at home usually < 140 sbp Past Medical History  Diagnosis Date  . ALCOHOL ABUSE 07/13/2010  . ANXIETY 12/22/2009  . DEPRESSION 12/22/2009  . 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 FATIGUE 12/23/2010  . GALLSTONES 12/22/2009  . HYPERLIPIDEMIA 12/22/2009  . HYPERTENSION 12/22/2009  . HYPONATREMIA 12/22/2009  . 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 OSTEOPENIA 12/22/2009  . Pancytopenia 07/13/2010  . RENAL CYST, LEFT 12/22/2009  . VITAMIN D DEFICIENCY 12/23/2010  . WEIGHT LOSS 12/23/2010  . DJD (degenerative joint disease), cervical   . Renal cyst   . Gallstones    No past surgical history on file.  reports that she has never smoked. She does not have any smokeless tobacco history on file. She reports that  drinks alcohol. She reports that she does not use illicit drugs. family history includes Breast cancer in her sister. No Known Allergies Current Outpatient Prescriptions on File Prior to Visit  Medication Sig Dispense Refill  . alendronate (FOSAMAX) 70 MG tablet Take 1 tablet (70 mg total) by mouth every 7 (seven) days. Take with a full glass of water on an empty stomach.  12 tablet  3  . ALPRAZolam (XANAX) 0.5 MG tablet TAKE ONE TABLET BY MOUTH EVERY DAY AT BEDTIME AS NEEDED  90 tablet  1  . Calcium Carbonate-Vitamin D (CALTRATE 600+D) 600-400  MG-UNIT per tablet Take 1 tablet by mouth daily.        . Cholecalciferol (VITAMIN D3) 1000 UNITS CAPS Take by mouth daily.        . folic acid (FOLVITE) 1 MG tablet Take 1 tablet (1 mg total) by mouth daily.  90 tablet  3  . lactulose, encephalopathy, (GENERLAC) 10 GM/15ML SOLN Take 30 mLs (20 g total) by mouth daily.  2700 mL  5  . Multiple Vitamin (MULTIVITAMIN) tablet Take 1 tablet by mouth daily.        Marland Kitchen spironolactone (ALDACTONE) 50 MG tablet Take 1 tablet (50 mg total) by mouth daily.  90 tablet  3  . thiamine (VITAMIN B-1) 100 MG tablet Take 1 tablet (100 mg total) by mouth daily.  90 tablet  3   No current facility-administered medications on file prior to visit.   Review of Systems  Constitutional: Negative for unexpected weight change, or unusual diaphoresis  HENT: Negative for tinnitus.   Eyes: Negative for photophobia and visual disturbance.  Respiratory: Negative for choking and stridor.   Gastrointestinal: Negative for vomiting and blood in stool.  Genitourinary: Negative for hematuria and decreased urine volume.  Musculoskeletal: Negative for acute joint swelling Skin: Negative for color change and wound.  Neurological: Negative for tremors and numbness other than noted  Psychiatric/Behavioral: Negative for decreased concentration or  hyperactivity.       Objective:   Physical Exam BP  180/120  Pulse 97  Temp(Src) 97.8 F (36.6 C) (Oral)  Ht 5\' 3"  (1.6 m)  Wt 152 lb 8 oz (69.174 kg)  BMI 27.02 kg/m2  SpO2 99% VS noted,  Constitutional: Pt appears well-developed and well-nourished.  HENT: Head: NCAT.  Right Ear: External ear normal.  Left Ear: External ear normal.  Eyes: Conjunctivae and EOM are normal. Pupils are equal, round, and reactive to light.  Neck: Normal range of motion. Neck supple.  Cardiovascular: Normal rate and regular rhythm.   Pulmonary/Chest: Effort normal and breath sounds normal.  Neurological: Pt is alert. Not confused , motor  intact Skin: Skin is warm. No erythema. Right antecub with 2x2 cm area eczema rash Left upper deltoid area with diffuse nondiscrete nontender swelling, faint erythema as well Psychiatric: Pt behavior is normal. + depressed affect    Assessment & Plan:

## 2013-07-29 NOTE — Assessment & Plan Note (Signed)
Right antecub area c/w prob eczema - for triam cr prn, and the LUE deltoid area likely related to patch sensitivity - to d/c catapress patch, benadryl cream prn

## 2013-07-30 ENCOUNTER — Telehealth: Payer: Self-pay

## 2013-07-30 NOTE — Telephone Encounter (Signed)
I would like to hold off on the increased xanax at this time, need to give the citalopram time to work, as the xanax can become addictive

## 2013-07-30 NOTE — Telephone Encounter (Signed)
Patient daughter called stating that MD was to also increase alprazolam rx from current dose of 0.5mg . Please advise

## 2013-07-31 ENCOUNTER — Ambulatory Visit (INDEPENDENT_AMBULATORY_CARE_PROVIDER_SITE_OTHER)
Admission: RE | Admit: 2013-07-31 | Discharge: 2013-07-31 | Disposition: A | Discharge status: Home / Self Care | Payer: Medicare Other | Source: Ambulatory Visit | Attending: Internal Medicine | Admitting: Internal Medicine

## 2013-07-31 DIAGNOSIS — M949 Disorder of cartilage, unspecified: Secondary | ICD-10-CM

## 2013-07-31 DIAGNOSIS — M899 Disorder of bone, unspecified: Secondary | ICD-10-CM

## 2013-07-31 NOTE — Telephone Encounter (Signed)
Patient informed. 

## 2013-10-23 ENCOUNTER — Other Ambulatory Visit: Payer: Self-pay

## 2013-11-25 ENCOUNTER — Other Ambulatory Visit: Payer: Self-pay | Admitting: Internal Medicine

## 2013-11-25 DIAGNOSIS — Z1231 Encounter for screening mammogram for malignant neoplasm of breast: Secondary | ICD-10-CM

## 2013-11-29 ENCOUNTER — Other Ambulatory Visit: Payer: Self-pay | Admitting: Internal Medicine

## 2013-12-17 ENCOUNTER — Ambulatory Visit (HOSPITAL_COMMUNITY)
Admission: RE | Admit: 2013-12-17 | Discharge: 2013-12-17 | Disposition: A | Discharge status: Home / Self Care | Payer: Medicare Other | Source: Ambulatory Visit | Attending: Internal Medicine | Admitting: Internal Medicine

## 2013-12-17 DIAGNOSIS — Z1231 Encounter for screening mammogram for malignant neoplasm of breast: Secondary | ICD-10-CM | POA: Diagnosis not present

## 2013-12-17 LAB — HM MAMMOGRAPHY

## 2014-01-20 ENCOUNTER — Other Ambulatory Visit: Payer: Self-pay | Admitting: Internal Medicine

## 2014-01-20 NOTE — Telephone Encounter (Signed)
Done hardcopy to [facility]  

## 2014-01-20 NOTE — Telephone Encounter (Signed)
Faxed hardcopy to [facility]

## 2014-02-03 ENCOUNTER — Ambulatory Visit: Payer: Medicare Other | Admitting: Internal Medicine

## 2014-02-06 ENCOUNTER — Ambulatory Visit (INDEPENDENT_AMBULATORY_CARE_PROVIDER_SITE_OTHER): Payer: Medicare Other | Admitting: Internal Medicine

## 2014-02-06 ENCOUNTER — Encounter: Payer: Self-pay | Admitting: Internal Medicine

## 2014-02-06 VITALS — BP 142/84 | HR 77 | Temp 97.3°F | Ht 63.0 in | Wt 137.5 lb

## 2014-02-06 DIAGNOSIS — E785 Hyperlipidemia, unspecified: Secondary | ICD-10-CM | POA: Diagnosis not present

## 2014-02-06 DIAGNOSIS — R7309 Other abnormal glucose: Secondary | ICD-10-CM

## 2014-02-06 DIAGNOSIS — F3289 Other specified depressive episodes: Secondary | ICD-10-CM | POA: Diagnosis not present

## 2014-02-06 DIAGNOSIS — F329 Major depressive disorder, single episode, unspecified: Secondary | ICD-10-CM | POA: Diagnosis not present

## 2014-02-06 DIAGNOSIS — I10 Essential (primary) hypertension: Secondary | ICD-10-CM | POA: Diagnosis not present

## 2014-02-06 DIAGNOSIS — R7302 Impaired glucose tolerance (oral): Secondary | ICD-10-CM

## 2014-02-06 MED ORDER — ALENDRONATE SODIUM 70 MG PO TABS: 70.0000 mg | ORAL_TABLET | ORAL | Status: DC

## 2014-02-06 NOTE — Assessment & Plan Note (Signed)
stable overall by history and exam, recent data reviewed with pt, and pt to continue medical treatment as before,  to f/u any worsening symptoms or concerns Lab Results  Component Value Date   HGBA1C 6.2 07/29/2013

## 2014-02-06 NOTE — Progress Notes (Signed)
Pre-visit discussion using our clinic review tool. No additional management support is needed unless otherwise documented below in the visit note.  

## 2014-02-06 NOTE — Progress Notes (Signed)
Subjective:    Patient ID: Sarah Harmon, female    DOB: 03/14/1946, 68 y.o.   MRN: [id number]  HPI Here to f/u; overall doing ok,  Pt denies chest pain, increased sob or doe, wheezing, orthopnea, PND, increased LE swelling, palpitations, dizziness or syncope.  Pt denies polydipsia, polyuria, or low sugar symptoms such as weakness or confusion improved with po intake.  Pt denies new neurological symptoms such as new headache, or facial or extremity weakness or numbness.   Pt states overall good compliance with meds, has been trying to follow lower cholesterol diet, with wt overall stable. Denies worsening depressive symptoms, suicidal ideation, or panic Past Medical History  Diagnosis Date  . ALCOHOL ABUSE 07/13/2010  . ANXIETY 12/22/2009  . DEPRESSION 12/22/2009  . 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 FATIGUE 12/23/2010  . GALLSTONES 12/22/2009  . HYPERLIPIDEMIA 12/22/2009  . HYPERTENSION 12/22/2009  . HYPONATREMIA 12/22/2009  . 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 OSTEOPENIA 12/22/2009  . Pancytopenia 07/13/2010  . RENAL CYST, LEFT 12/22/2009  . VITAMIN D DEFICIENCY 12/23/2010  . WEIGHT LOSS 12/23/2010  . DJD (degenerative joint disease), cervical   . Renal cyst   . Gallstones    No past surgical history on file.  reports that she has never smoked. She does not have any smokeless tobacco history on file. She reports that she drinks alcohol. She reports that she does not use illicit drugs. family history includes Breast cancer in her sister. No Known Allergies Current Outpatient Prescriptions on File Prior to Visit  Medication Sig Dispense Refill  . ALPRAZolam (XANAX) 0.5 MG tablet TAKE ONE TABLET BY MOUTH AT BEDTIME AS NEEDED  90 tablet  1  . Calcium Carbonate-Vitamin D (CALTRATE 600+D) 600-400 MG-UNIT per tablet Take 1 tablet by mouth daily.        . Cholecalciferol (VITAMIN D3) 1000 UNITS CAPS Take by mouth daily.        . citalopram (CELEXA) 20 MG tablet Take 1 tablet (20 mg total) by  mouth daily.  90 tablet  3  . cloNIDine (CATAPRES) 0.2 MG tablet Take 1 tablet (0.2 mg total) by mouth 2 (two) times daily.  60 tablet  11  . folic acid (FOLVITE) 1 MG tablet Take 1 tablet (1 mg total) by mouth daily.  90 tablet  3  . lactulose (CHRONULAC) 10 GM/15ML solution TAKE 30ML (20G TOTAL) BY MOUTH EVERY DAY  2700 mL  0  . Multiple Vitamin (MULTIVITAMIN) tablet Take 1 tablet by mouth daily.        . propranolol (INDERAL) 20 MG tablet Take 1 tablet (20 mg total) by mouth 2 (two) times daily.  60 tablet  11  . thiamine (VITAMIN B-1) 100 MG tablet Take 1 tablet (100 mg total) by mouth daily.  90 tablet  3  . triamcinolone cream (KENALOG) 0.1 % Apply topically 2 (two) times daily.  30 g  0  . spironolactone (ALDACTONE) 50 MG tablet Take 1 tablet (50 mg total) by mouth daily.  90 tablet  3   No current facility-administered medications on file prior to visit.   Review of Systems  Constitutional: Negative for unexpected weight change, or unusual diaphoresis  HENT: Negative for tinnitus.   Eyes: Negative for photophobia and visual disturbance.  Respiratory: Negative for choking and stridor.   Gastrointestinal: Negative for vomiting and blood in stool.  Genitourinary: Negative for hematuria and decreased urine volume.  Musculoskeletal: Negative for acute joint swelling Skin: Negative for  color change and wound.  Neurological: Negative for tremors and numbness other than noted  Psychiatric/Behavioral: Negative for decreased concentration or  hyperactivity.       Objective:   Physical Exam BP 142/84  Pulse 77  Temp(Src) 97.3 F (36.3 C) (Oral)  Ht 5\' 3"  (1.6 m)  Wt 137 lb 8 oz (62.37 kg)  BMI 24.36 kg/m2  SpO2 95% VS noted,  Constitutional: Pt appears well-developed and well-nourished.  HENT: Head: NCAT.  Right Ear: External ear normal.  Left Ear: External ear normal.  Eyes: Conjunctivae and EOM are normal. Pupils are equal, round, and reactive to light.  Neck: Normal range of  motion. Neck supple.  Cardiovascular: Normal rate and regular rhythm.   Pulmonary/Chest: Effort normal and breath sounds normal.  Abd:  Soft, NT, non-distended, + BS Neurological: Pt is alert. At baseline confusion Skin: Skin is warm. No erythema.  Psychiatric: Pt behavior is normal.     Assessment & Plan:

## 2014-02-06 NOTE — Patient Instructions (Signed)
Please continue all other medications as before, and refills have been done if requested. Please have the pharmacy call with any other refills you may need.  Please remember to sign up for MyChart if you have not done so, as this will be important to you in the future with finding out test results, communicating by private email, and scheduling acute appointments online when needed.  Please return in 6 months, or sooner if needed

## 2014-02-06 NOTE — Assessment & Plan Note (Signed)
stable overall by history and exam, recent data reviewed with pt, and pt to continue medical treatment as before,  to f/u any worsening symptoms or concerns Lab Results  Component Value Date   WBC 4.5 07/29/2013   HGB 13.8 07/29/2013   HCT 41.9 07/29/2013   PLT 153.0 07/29/2013   GLUCOSE 88 07/29/2013   CHOL 171 07/29/2013   TRIG 92.0 07/29/2013   HDL 76.10 07/29/2013   LDLCALC 77 07/29/2013   ALT 30 07/29/2013   AST 47* 07/29/2013   NA 135 07/29/2013   K 4.5 07/29/2013   CL 98 07/29/2013   CREATININE 1.1 07/29/2013   BUN 8 07/29/2013   CO2 25 07/29/2013   TSH 1.35 07/29/2013   INR 1.41 06/05/2010   HGBA1C 6.2 07/29/2013

## 2014-02-06 NOTE — Assessment & Plan Note (Signed)
stable overall by history and exam, recent data reviewed with pt, and pt to continue medical treatment as before,  to f/u any worsening symptoms or concerns BP Readings from Last 3 Encounters:  02/06/14 142/84  07/29/13 180/120  12/27/12 120/82

## 2014-02-06 NOTE — Assessment & Plan Note (Signed)
stable overall by history and exam, recent data reviewed with pt, and pt to continue medical treatment as before,  to f/u any worsening symptoms or concerns Lab Results  Component Value Date   LDLCALC 77 07/29/2013

## 2014-03-07 ENCOUNTER — Other Ambulatory Visit: Payer: Self-pay | Admitting: Internal Medicine

## 2014-03-11 ENCOUNTER — Other Ambulatory Visit: Payer: Self-pay | Admitting: Internal Medicine

## 2014-03-27 ENCOUNTER — Other Ambulatory Visit: Payer: Self-pay | Admitting: Internal Medicine

## 2014-05-18 ENCOUNTER — Telehealth: Payer: Self-pay | Admitting: Internal Medicine

## 2014-05-18 NOTE — Telephone Encounter (Signed)
Pt called need a letter to be out of jury duty on 05/27/14. Pt is on medication that require her to go to the restroom often and pt is unable to stand of a long period of time. Please advise. Jury # [id number].

## 2014-05-19 NOTE — Telephone Encounter (Signed)
[place] for letter - to robin to help please

## 2014-05-19 NOTE — Telephone Encounter (Signed)
Completed letter and called the patient to inform to pickup at the front desk.

## 2014-06-17 ENCOUNTER — Other Ambulatory Visit: Payer: Self-pay | Admitting: Internal Medicine

## 2014-08-06 ENCOUNTER — Ambulatory Visit (INDEPENDENT_AMBULATORY_CARE_PROVIDER_SITE_OTHER): Payer: Medicare Other | Admitting: Internal Medicine

## 2014-08-06 ENCOUNTER — Encounter: Payer: Self-pay | Admitting: Internal Medicine

## 2014-08-06 VITALS — BP 140/100 | HR 71 | Temp 98.0°F | Wt 140.2 lb

## 2014-08-06 DIAGNOSIS — F1010 Alcohol abuse, uncomplicated: Secondary | ICD-10-CM

## 2014-08-06 DIAGNOSIS — R7309 Other abnormal glucose: Secondary | ICD-10-CM

## 2014-08-06 DIAGNOSIS — I10 Essential (primary) hypertension: Secondary | ICD-10-CM

## 2014-08-06 DIAGNOSIS — K7030 Alcoholic cirrhosis of liver without ascites: Secondary | ICD-10-CM

## 2014-08-06 DIAGNOSIS — R7302 Impaired glucose tolerance (oral): Secondary | ICD-10-CM

## 2014-08-06 MED ORDER — AMLODIPINE BESYLATE 5 MG PO TABS: 5.0000 mg | ORAL_TABLET | Freq: Every day | ORAL | Status: DC

## 2014-08-06 MED ORDER — ALPRAZOLAM 0.5 MG PO TABS: ORAL_TABLET | ORAL | Status: DC

## 2014-08-06 MED ORDER — CITALOPRAM HYDROBROMIDE 20 MG PO TABS: 20.0000 mg | ORAL_TABLET | Freq: Every day | ORAL | Status: DC

## 2014-08-06 NOTE — Addendum Note (Signed)
Addended by: Biagio Borg on: 08/06/2014 09:02 AM   Modules accepted: Orders

## 2014-08-06 NOTE — Assessment & Plan Note (Signed)
Mental status ok today, I think can hold on further labs and ammonia level today

## 2014-08-06 NOTE — Progress Notes (Signed)
Pre visit review using our clinic review tool, if applicable. No additional management support is needed unless otherwise documented below in the visit note. 

## 2014-08-06 NOTE — Assessment & Plan Note (Signed)
stable overall by history and exam, recent data reviewed with pt, and pt to continue medical treatment as before,  to f/u any worsening symptoms or concerns Lab Results  Component Value Date   HGBA1C 6.2 07/29/2013

## 2014-08-06 NOTE — Patient Instructions (Addendum)
Please return if you change your mind about the new Pnuemonia shot  Please take all new medication as prescribed- the amlodipine 5 mg per day  Please continue all other medications as before, and refills have been done if requested.  Please have the pharmacy call with any other refills you may need.  Please continue your efforts at being more active, low cholesterol diet, and weight control.  Please keep your appointments with your specialists as you may have planned  Please return in 6 months, or sooner if needed

## 2014-08-06 NOTE — Assessment & Plan Note (Signed)
Urged to quit 

## 2014-08-06 NOTE — Assessment & Plan Note (Signed)
Uncontrolled but stable overall by history and exam, recent data reveiwed,  to f/u any worsening symptoms or concerns BP Readings from Last 3 Encounters:  08/06/14 140/100  02/06/14 142/84  07/29/13 180/120   To add amlod 5 qd

## 2014-08-06 NOTE — Progress Notes (Signed)
Subjective:    Patient ID: Sarah Harmon, female    DOB: 10/26/1946, 68 y.o.   MRN: [id number]  HPI  Here to f/u; overall doing ok,  Pt denies chest pain, increased sob or doe, wheezing, orthopnea, PND, increased LE swelling, palpitations, dizziness or syncope.  Pt denies polydipsia, polyuria, or low sugar symptoms such as weakness or confusion improved with po intake.  Pt denies new neurological symptoms such as new headache, or facial or extremity weakness or numbness.   Pt states overall good compliance with meds, has been trying to follow lower cholesterol, diet, with wt overall stable,  but little exercise however.  Admits to some minor drinking recently, Denies worsening depressive symptoms, suicidal ideation, or panic; has ongoing anxiety, asks for benzo refill.  Also admits to some increased salt use recently, also very mild.  Not checking BP at home but has cuff. Decilnes prevnar today Past Medical History  Diagnosis Date  . ALCOHOL ABUSE 07/13/2010  . ANXIETY 12/22/2009  . DEPRESSION 12/22/2009  . 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 FATIGUE 12/23/2010  . GALLSTONES 12/22/2009  . HYPERLIPIDEMIA 12/22/2009  . HYPERTENSION 12/22/2009  . HYPONATREMIA 12/22/2009  . 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 OSTEOPENIA 12/22/2009  . Pancytopenia 07/13/2010  . RENAL CYST, LEFT 12/22/2009  . VITAMIN D DEFICIENCY 12/23/2010  . WEIGHT LOSS 12/23/2010  . DJD (degenerative joint disease), cervical   . Renal cyst   . Gallstones    No past surgical history on file.  reports that she has never smoked. She does not have any smokeless tobacco history on file. She reports that she drinks alcohol. She reports that she does not use illicit drugs. family history includes Breast cancer in her sister. No Known Allergies Current Outpatient Prescriptions on File Prior to Visit  Medication Sig Dispense Refill  . alendronate (FOSAMAX) 70 MG tablet Take 1 tablet (70 mg total) by mouth every 7 (seven) days. Take with a  full glass of water on an empty stomach.  12 tablet  3  . ALPRAZolam (XANAX) 0.5 MG tablet TAKE ONE TABLET BY MOUTH AT BEDTIME AS NEEDED  90 tablet  1  . Calcium Carbonate-Vitamin D (CALTRATE 600+D) 600-400 MG-UNIT per tablet Take 1 tablet by mouth daily.        . Cholecalciferol (VITAMIN D3) 1000 UNITS CAPS Take by mouth daily.        . cloNIDine (CATAPRES) 0.2 MG tablet Take 1 tablet (0.2 mg total) by mouth 2 (two) times daily.  60 tablet  11  . folic acid (FOLVITE) 1 MG tablet TAKE ONE TABLET BY MOUTH ONCE DAILY  90 tablet  3  . lactulose (CHRONULAC) 10 GM/15ML solution TAKE 30ML (20G TOTAL) BY MOUTH EVERY DAY  2700 mL  0  . Multiple Vitamin (MULTIVITAMIN) tablet Take 1 tablet by mouth daily.        . propranolol (INDERAL) 20 MG tablet Take 1 tablet (20 mg total) by mouth 2 (two) times daily.  60 tablet  11  . spironolactone (ALDACTONE) 50 MG tablet TAKE ONE TABLET BY MOUTH ONCE DAILY  90 tablet  3  . thiamine (VITAMIN B-1) 100 MG tablet Take 1 tablet (100 mg total) by mouth daily.  90 tablet  3  . triamcinolone cream (KENALOG) 0.1 % Apply topically 2 (two) times daily.  30 g  0   No current facility-administered medications on file prior to visit.   Review of Systems  Constitutional: Negative for unusual diaphoresis  or other sweats  HENT: Negative for ringing in ear Eyes: Negative for double vision or worsening visual disturbance.  Respiratory: Negative for choking and stridor.   Gastrointestinal: Negative for vomiting or other signifcant bowel change Genitourinary: Negative for hematuria or decreased urine volume.  Musculoskeletal: Negative for other MSK pain or swelling Skin: Negative for color change and worsening wound.  Neurological: Negative for tremors and numbness other than noted  Psychiatric/Behavioral: Negative for decreased concentration or agitation other than above       Objective:   Physical Exam BP 140/100  Pulse 71  Temp(Src) 98 F (36.7 C) (Oral)  Wt 140 lb 4  oz (63.617 kg)  SpO2 97% VS noted,  Constitutional: Pt appears well-developed, well-nourished.  HENT: Head: NCAT.  Right Ear: External ear normal.  Left Ear: External ear normal.  Eyes: . Pupils are equal, round, and reactive to light. Conjunctivae and EOM are normal Neck: Normal range of motion. Neck supple.  Cardiovascular: Normal rate and regular rhythm.   Pulmonary/Chest: Effort normal and breath sounds normal.  Abd:  Soft, NT, ND, + BS Neurological: Pt is alert. Not confused , motor grossly intact Skin: Skin is warm. No rash, slight puffiness to left ankle/foot Psychiatric: Pt behavior is normal. No agitation.     Assessment & Plan:   BP Readings from Last 3 Encounters:  08/06/14 140/100  02/06/14 142/84  07/29/13 180/120

## 2014-08-07 ENCOUNTER — Telehealth: Payer: Self-pay | Admitting: Internal Medicine

## 2014-08-07 NOTE — Telephone Encounter (Signed)
Relevant patient education assigned to patient using Emmi. ° °

## 2014-09-22 ENCOUNTER — Other Ambulatory Visit: Payer: Self-pay | Admitting: Internal Medicine

## 2014-10-02 ENCOUNTER — Other Ambulatory Visit: Payer: Self-pay

## 2014-12-18 DIAGNOSIS — C220 Liver cell carcinoma: Secondary | ICD-10-CM

## 2014-12-18 HISTORY — DX: Liver cell carcinoma: C22.0

## 2014-12-29 ENCOUNTER — Other Ambulatory Visit: Payer: Self-pay | Admitting: Internal Medicine

## 2015-02-09 ENCOUNTER — Ambulatory Visit: Payer: Medicare Other | Admitting: Internal Medicine

## 2015-02-26 ENCOUNTER — Ambulatory Visit (INDEPENDENT_AMBULATORY_CARE_PROVIDER_SITE_OTHER): Payer: Medicare Other | Admitting: Internal Medicine

## 2015-02-26 ENCOUNTER — Encounter (HOSPITAL_COMMUNITY): Payer: Self-pay | Admitting: Emergency Medicine

## 2015-02-26 ENCOUNTER — Inpatient Hospital Stay (HOSPITAL_COMMUNITY)
Admission: EM | Admit: 2015-02-26 | Discharge: 2015-03-02 | DRG: 436 | Disposition: A | Discharge status: Home / Self Care | Payer: Medicare Other | Attending: Internal Medicine | Admitting: Internal Medicine

## 2015-02-26 ENCOUNTER — Other Ambulatory Visit (INDEPENDENT_AMBULATORY_CARE_PROVIDER_SITE_OTHER): Payer: Medicare Other

## 2015-02-26 ENCOUNTER — Telehealth: Payer: Self-pay | Admitting: Internal Medicine

## 2015-02-26 ENCOUNTER — Encounter: Payer: Self-pay | Admitting: Internal Medicine

## 2015-02-26 VITALS — BP 138/86 | HR 72 | Temp 97.9°F | Resp 18 | Ht 63.0 in | Wt 126.1 lb

## 2015-02-26 DIAGNOSIS — Z803 Family history of malignant neoplasm of breast: Secondary | ICD-10-CM

## 2015-02-26 DIAGNOSIS — I10 Essential (primary) hypertension: Secondary | ICD-10-CM

## 2015-02-26 DIAGNOSIS — C220 Liver cell carcinoma: Secondary | ICD-10-CM | POA: Diagnosis not present

## 2015-02-26 DIAGNOSIS — N2889 Other specified disorders of kidney and ureter: Secondary | ICD-10-CM | POA: Diagnosis present

## 2015-02-26 DIAGNOSIS — R0602 Shortness of breath: Secondary | ICD-10-CM | POA: Diagnosis not present

## 2015-02-26 DIAGNOSIS — R7302 Impaired glucose tolerance (oral): Secondary | ICD-10-CM | POA: Diagnosis not present

## 2015-02-26 DIAGNOSIS — Z6822 Body mass index (BMI) 22.0-22.9, adult: Secondary | ICD-10-CM

## 2015-02-26 DIAGNOSIS — E785 Hyperlipidemia, unspecified: Secondary | ICD-10-CM

## 2015-02-26 DIAGNOSIS — D638 Anemia in other chronic diseases classified elsewhere: Secondary | ICD-10-CM | POA: Diagnosis not present

## 2015-02-26 DIAGNOSIS — E222 Syndrome of inappropriate secretion of antidiuretic hormone: Secondary | ICD-10-CM | POA: Diagnosis not present

## 2015-02-26 DIAGNOSIS — F32A Depression, unspecified: Secondary | ICD-10-CM | POA: Diagnosis present

## 2015-02-26 DIAGNOSIS — K7030 Alcoholic cirrhosis of liver without ascites: Secondary | ICD-10-CM | POA: Diagnosis not present

## 2015-02-26 DIAGNOSIS — F329 Major depressive disorder, single episode, unspecified: Secondary | ICD-10-CM | POA: Diagnosis present

## 2015-02-26 DIAGNOSIS — D630 Anemia in neoplastic disease: Secondary | ICD-10-CM | POA: Diagnosis not present

## 2015-02-26 DIAGNOSIS — K8020 Calculus of gallbladder without cholecystitis without obstruction: Secondary | ICD-10-CM | POA: Diagnosis not present

## 2015-02-26 DIAGNOSIS — C801 Malignant (primary) neoplasm, unspecified: Secondary | ICD-10-CM | POA: Diagnosis not present

## 2015-02-26 DIAGNOSIS — I370 Nonrheumatic pulmonary valve stenosis: Secondary | ICD-10-CM | POA: Diagnosis not present

## 2015-02-26 DIAGNOSIS — F1020 Alcohol dependence, uncomplicated: Secondary | ICD-10-CM | POA: Diagnosis present

## 2015-02-26 DIAGNOSIS — E46 Unspecified protein-calorie malnutrition: Secondary | ICD-10-CM | POA: Diagnosis present

## 2015-02-26 DIAGNOSIS — N281 Cyst of kidney, acquired: Secondary | ICD-10-CM | POA: Diagnosis not present

## 2015-02-26 DIAGNOSIS — D696 Thrombocytopenia, unspecified: Secondary | ICD-10-CM | POA: Diagnosis present

## 2015-02-26 DIAGNOSIS — E871 Hypo-osmolality and hyponatremia: Secondary | ICD-10-CM | POA: Diagnosis not present

## 2015-02-26 DIAGNOSIS — Z23 Encounter for immunization: Secondary | ICD-10-CM | POA: Diagnosis not present

## 2015-02-26 DIAGNOSIS — F419 Anxiety disorder, unspecified: Secondary | ICD-10-CM | POA: Diagnosis present

## 2015-02-26 DIAGNOSIS — R160 Hepatomegaly, not elsewhere classified: Secondary | ICD-10-CM | POA: Diagnosis present

## 2015-02-26 DIAGNOSIS — K7460 Unspecified cirrhosis of liver: Secondary | ICD-10-CM | POA: Diagnosis not present

## 2015-02-26 DIAGNOSIS — M8580 Other specified disorders of bone density and structure, unspecified site: Secondary | ICD-10-CM | POA: Diagnosis present

## 2015-02-26 DIAGNOSIS — Z7983 Long term (current) use of bisphosphonates: Secondary | ICD-10-CM | POA: Diagnosis not present

## 2015-02-26 DIAGNOSIS — K7040 Alcoholic hepatic failure without coma: Secondary | ICD-10-CM | POA: Diagnosis not present

## 2015-02-26 DIAGNOSIS — D6959 Other secondary thrombocytopenia: Secondary | ICD-10-CM | POA: Diagnosis not present

## 2015-02-26 DIAGNOSIS — I160 Hypertensive urgency: Secondary | ICD-10-CM | POA: Diagnosis present

## 2015-02-26 LAB — LIPID PANEL
CHOL/HDL RATIO: 2
CHOLESTEROL: 182 mg/dL (ref 0–200)
HDL: 94.4 mg/dL (ref >39.00)
LDL CALC: 51 mg/dL (ref 0–99)
NonHDL: 87.6
TRIGLYCERIDES: 182 mg/dL — AB (ref 0.0–149.0)
VLDL: 36.4 mg/dL (ref 0.0–40.0)

## 2015-02-26 LAB — CBC WITH DIFFERENTIAL/PLATELET
BASOS ABS: 0 10*3/uL (ref 0.0–0.1)
BASOS PCT: 1 % (ref 0–1)
Basophils Absolute: 0 10*3/uL (ref 0.0–0.1)
Basophils Relative: 0.4 % (ref 0.0–3.0)
EOS ABS: 0.2 10*3/uL (ref 0.0–0.7)
Eosinophils Absolute: 0.3 10*3/uL (ref 0.0–0.7)
Eosinophils Relative: 7.6 % — ABNORMAL HIGH (ref 0.0–5.0)
Eosinophils Relative: 7 % — ABNORMAL HIGH (ref 0–5)
HCT: 40.4 % (ref 36.0–46.0)
HCT: 37.5 % (ref 36.0–46.0)
HEMOGLOBIN: 13.5 g/dL (ref 12.0–15.0)
Hemoglobin: 13.9 g/dL (ref 12.0–15.0)
LYMPHS ABS: 0.9 10*3/uL (ref 0.7–4.0)
Lymphocytes Relative: 25.3 % (ref 12.0–46.0)
Lymphocytes Relative: 30 % (ref 12–46)
Lymphs Abs: 1 10*3/uL (ref 0.7–4.0)
MCH: 32.1 pg (ref 26.0–34.0)
MCHC: 34.5 g/dL (ref 30.0–36.0)
MCHC: 36 g/dL (ref 30.0–36.0)
MCV: 93.7 fl (ref 78.0–100.0)
MCV: 89.1 fL (ref 78.0–100.0)
MONOS PCT: 14.5 % — AB (ref 3.0–12.0)
MONOS PCT: 16 % — AB (ref 3–12)
Monocytes Absolute: 0.5 10*3/uL (ref 0.1–1.0)
Monocytes Absolute: 0.5 10*3/uL (ref 0.1–1.0)
NEUTROS ABS: 1.6 10*3/uL — AB (ref 1.7–7.7)
Neutro Abs: 1.8 10*3/uL (ref 1.4–7.7)
Neutrophils Relative %: 52.2 % (ref 43.0–77.0)
Neutrophils Relative %: 48 % (ref 43–77)
Platelets: 156 10*3/uL (ref 150.0–400.0)
Platelets: 155 10*3/uL (ref 150–400)
RBC: 4.31 Mil/uL (ref 3.87–5.11)
RBC: 4.21 MIL/uL (ref 3.87–5.11)
RDW: 13.9 % (ref 11.5–15.5)
RDW: 12.9 % (ref 11.5–15.5)
WBC: 3.4 10*3/uL — ABNORMAL LOW (ref 4.0–10.5)
WBC: 3.4 10*3/uL — ABNORMAL LOW (ref 4.0–10.5)

## 2015-02-26 LAB — I-STAT CHEM 8, ED
BUN: 13 mg/dL (ref 6–23)
CHLORIDE: 85 mmol/L — AB (ref 96–112)
Calcium, Ion: 1.22 mmol/L (ref 1.13–1.30)
Creatinine, Ser: 1.2 mg/dL — ABNORMAL HIGH (ref 0.50–1.10)
GLUCOSE: 93 mg/dL (ref 70–99)
HCT: 46 % (ref 36.0–46.0)
HEMOGLOBIN: 15.6 g/dL — AB (ref 12.0–15.0)
POTASSIUM: 4.4 mmol/L (ref 3.5–5.1)
SODIUM: 120 mmol/L — AB (ref 135–145)
TCO2: 20 mmol/L (ref 0–100)

## 2015-02-26 LAB — BASIC METABOLIC PANEL
BUN: 13 mg/dL (ref 6–23)
CHLORIDE: 86 meq/L — AB (ref 96–112)
CO2: 27 mEq/L (ref 19–32)
CREATININE: 1.17 mg/dL (ref 0.40–1.20)
Calcium: 10.5 mg/dL (ref 8.4–10.5)
GFR: 58.75 mL/min — ABNORMAL LOW (ref >60.00)
Glucose, Bld: 76 mg/dL (ref 70–99)
Potassium: 4.6 mEq/L (ref 3.5–5.1)
Sodium: 118 mEq/L — CL (ref 135–145)

## 2015-02-26 LAB — HEPATIC FUNCTION PANEL
ALK PHOS: 54 U/L (ref 39–117)
ALT: 24 U/L (ref 0–35)
AST: 35 U/L (ref 0–37)
Albumin: 3.7 g/dL (ref 3.5–5.2)
Bilirubin, Direct: 0.1 mg/dL (ref 0.0–0.3)
TOTAL PROTEIN: 8.2 g/dL (ref 6.0–8.3)
Total Bilirubin: 0.5 mg/dL (ref 0.2–1.2)

## 2015-02-26 LAB — URINALYSIS, ROUTINE W REFLEX MICROSCOPIC
Bilirubin Urine: NEGATIVE
HGB URINE DIPSTICK: NEGATIVE
Ketones, ur: NEGATIVE
Leukocytes, UA: NEGATIVE
NITRITE: NEGATIVE
TOTAL PROTEIN, URINE-UPE24: 100 — AB
URINE GLUCOSE: NEGATIVE
Urobilinogen, UA: 0.2 (ref 0.0–1.0)
pH: 6 (ref 5.0–8.0)

## 2015-02-26 LAB — TSH: TSH: 0.94 u[IU]/mL (ref 0.35–4.50)

## 2015-02-26 LAB — HEMOGLOBIN A1C: Hgb A1c MFr Bld: 5.8 % (ref 4.6–6.5)

## 2015-02-26 LAB — URIC ACID: Uric Acid, Serum: 6.9 mg/dL (ref 2.4–7.0)

## 2015-02-26 MED ORDER — ACETAMINOPHEN 325 MG PO TABS: 650.0000 mg | ORAL_TABLET | Freq: Four times a day (QID) | ORAL | Status: DC | PRN

## 2015-02-26 MED ORDER — FOLIC ACID 1 MG PO TABS: 1.0000 mg | ORAL_TABLET | Freq: Every day | ORAL | Status: DC

## 2015-02-26 MED ORDER — LACTULOSE 10 GM/15ML PO SOLN
30.0000 g | Freq: Every day | ORAL | Status: DC | PRN
  Filled 2015-02-26: qty 45

## 2015-02-26 MED ORDER — HYDRALAZINE HCL 20 MG/ML IJ SOLN
10.0000 mg | INTRAMUSCULAR | Status: AC
  Administered 2015-02-26: 10 mg via INTRAVENOUS
  Filled 2015-02-26: qty 1

## 2015-02-26 MED ORDER — VITAMIN D 1000 UNITS PO TABS
1000.0000 [IU] | ORAL_TABLET | Freq: Every day | ORAL | Status: DC
  Administered 2015-02-27 – 2015-03-02 (×4): 1000 [IU] via ORAL
  Filled 2015-02-26 (×4): qty 1

## 2015-02-26 MED ORDER — ALPRAZOLAM 0.5 MG PO TABS: ORAL_TABLET | ORAL | Status: DC

## 2015-02-26 MED ORDER — PROPRANOLOL HCL 20 MG PO TABS
20.0000 mg | ORAL_TABLET | Freq: Two times a day (BID) | ORAL | Status: DC
  Administered 2015-02-27 – 2015-03-02 (×8): 20 mg via ORAL
  Filled 2015-02-26 (×9): qty 1

## 2015-02-26 MED ORDER — ONDANSETRON HCL 4 MG/2ML IJ SOLN: 4.0000 mg | Freq: Four times a day (QID) | INTRAMUSCULAR | Status: DC | PRN

## 2015-02-26 MED ORDER — ALPRAZOLAM 0.5 MG PO TABS
0.5000 mg | ORAL_TABLET | Freq: Every evening | ORAL | Status: DC | PRN
  Administered 2015-02-27 – 2015-03-01 (×4): 0.5 mg via ORAL
  Filled 2015-02-26: qty 1
  Filled 2015-02-26: qty 2
  Filled 2015-02-26 (×2): qty 1

## 2015-02-26 MED ORDER — SODIUM CHLORIDE 0.9 % IJ SOLN
3.0000 mL | Freq: Two times a day (BID) | INTRAMUSCULAR | Status: DC
  Administered 2015-02-27 – 2015-03-02 (×8): 3 mL via INTRAVENOUS

## 2015-02-26 MED ORDER — CLONIDINE HCL 0.2 MG PO TABS
0.2000 mg | ORAL_TABLET | Freq: Two times a day (BID) | ORAL | Status: DC
  Administered 2015-02-27 – 2015-03-01 (×6): 0.2 mg via ORAL
  Filled 2015-02-26 (×7): qty 1

## 2015-02-26 MED ORDER — ALENDRONATE SODIUM 70 MG PO TABS: 70.0000 mg | ORAL_TABLET | ORAL | Status: DC

## 2015-02-26 MED ORDER — ONDANSETRON HCL 4 MG PO TABS: 4.0000 mg | ORAL_TABLET | Freq: Four times a day (QID) | ORAL | Status: DC | PRN

## 2015-02-26 MED ORDER — KETOTIFEN FUMARATE 0.025 % OP SOLN
1.0000 [drp] | Freq: Two times a day (BID) | OPHTHALMIC | Status: DC
  Administered 2015-02-27 – 2015-03-02 (×8): 1 [drp] via OPHTHALMIC
  Filled 2015-02-26: qty 5

## 2015-02-26 MED ORDER — CITALOPRAM HYDROBROMIDE 20 MG PO TABS
20.0000 mg | ORAL_TABLET | Freq: Every day | ORAL | Status: DC
  Administered 2015-02-27 – 2015-03-01 (×3): 20 mg via ORAL
  Filled 2015-02-26 (×3): qty 1

## 2015-02-26 MED ORDER — HYDRALAZINE HCL 20 MG/ML IJ SOLN
10.0000 mg | INTRAMUSCULAR | Status: DC | PRN
  Administered 2015-02-28 – 2015-03-01 (×2): 10 mg via INTRAVENOUS
  Filled 2015-02-26 (×2): qty 1

## 2015-02-26 MED ORDER — PNEUMOCOCCAL VAC POLYVALENT 25 MCG/0.5ML IJ INJ
0.5000 mL | INJECTION | INTRAMUSCULAR | Status: DC
  Filled 2015-02-26: qty 0.5

## 2015-02-26 MED ORDER — SODIUM CHLORIDE 0.9 % IV BOLUS (SEPSIS)
1000.0000 mL | Freq: Once | INTRAVENOUS | Status: AC
  Administered 2015-02-26: 1000 mL via INTRAVENOUS

## 2015-02-26 MED ORDER — ADULT MULTIVITAMIN W/MINERALS CH: 1.0000 | ORAL_TABLET | Freq: Every day | ORAL | Status: DC

## 2015-02-26 MED ORDER — ENOXAPARIN SODIUM 40 MG/0.4ML ~~LOC~~ SOLN
40.0000 mg | SUBCUTANEOUS | Status: DC
  Administered 2015-02-27: 40 mg via SUBCUTANEOUS
  Filled 2015-02-26: qty 0.4

## 2015-02-26 MED ORDER — ACETAMINOPHEN 650 MG RE SUPP: 650.0000 mg | Freq: Four times a day (QID) | RECTAL | Status: DC | PRN

## 2015-02-26 MED ORDER — AMLODIPINE BESYLATE 5 MG PO TABS
5.0000 mg | ORAL_TABLET | Freq: Every day | ORAL | Status: DC
  Administered 2015-02-27 – 2015-03-01 (×3): 5 mg via ORAL
  Filled 2015-02-26 (×3): qty 1

## 2015-02-26 MED ORDER — VITAMIN B-1 100 MG PO TABS: 100.0000 mg | ORAL_TABLET | Freq: Every day | ORAL | Status: DC

## 2015-02-26 NOTE — ED Notes (Signed)
MD at bedside. 

## 2015-02-26 NOTE — Progress Notes (Signed)
Pre visit review using our clinic review tool, if applicable. No additional management support is needed unless otherwise documented below in the visit note. 

## 2015-02-26 NOTE — ED Provider Notes (Signed)
CSN: [id number]     Arrival date & time 02/26/15  1525 History   First MD Initiated Contact with Patient 02/26/15 1824     Chief Complaint  Patient presents with  . Abnormal Lab     (Consider location/radiation/quality/duration/timing/severity/associated sxs/prior Treatment) HPI Patient presents to the emergency department with a low sodium level.  Her doctor called her today and told her that her sodium was low and she needed to come to the emergency department.  Patient has had no symptoms.  She denies chest pain, shortness of breath, headache, weakness, blurred vision, fever, abdominal pain, nausea, vomiting, altered mental status or syncope.  The patient states that she has had no other complaints.  States nothing seems make her condition better or worse Past Medical History  Diagnosis Date  . ALCOHOL ABUSE 07/13/2010  . ANXIETY 12/22/2009  . DEPRESSION 12/22/2009  . 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 FATIGUE 12/23/2010  . GALLSTONES 12/22/2009  . HYPERLIPIDEMIA 12/22/2009  . HYPERTENSION 12/22/2009  . HYPONATREMIA 12/22/2009  . 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 OSTEOPENIA 12/22/2009  . Pancytopenia 07/13/2010  . RENAL CYST, LEFT 12/22/2009  . VITAMIN D DEFICIENCY 12/23/2010  . WEIGHT LOSS 12/23/2010  . DJD (degenerative joint disease), cervical   . Renal cyst   . Gallstones    History reviewed. No pertinent past surgical history. Family History  Problem Relation Age of Onset  . Breast cancer Sister    History  Substance Use Topics  . Smoking status: Never Smoker   . Smokeless tobacco: Not on file  . Alcohol Use: Yes   OB History    No data available     Review of Systems  All other systems negative except as documented in the HPI. All pertinent positives and negatives as reviewed in the HPI.  Allergies  Review of patient's allergies indicates no known allergies.  Home Medications   Prior to Admission medications   Medication Sig Start Date End Date Taking?  Authorizing Provider  alendronate (FOSAMAX) 70 MG tablet Take 1 tablet (70 mg total) by mouth every 7 (seven) days. Take with a full glass of water on an empty stomach. 02/26/15   Biagio Borg, MD  ALPRAZolam Duanne Moron) 0.5 MG tablet TAKE ONE TABLET BY MOUTH AT BEDTIME AS NEEDED 02/26/15   Biagio Borg, MD  amLODipine (NORVASC) 5 MG tablet Take 1 tablet (5 mg total) by mouth daily. 08/06/14 08/06/15  Biagio Borg, MD  Calcium Carbonate-Vitamin D (CALTRATE 600+D) 600-400 MG-UNIT per tablet Take 1 tablet by mouth daily.      Historical Provider, MD  Cholecalciferol (VITAMIN D3) 1000 UNITS CAPS Take by mouth daily.      Historical Provider, MD  citalopram (CELEXA) 20 MG tablet Take 1 tablet (20 mg total) by mouth daily. 08/06/14   Biagio Borg, MD  cloNIDine (CATAPRES) 0.2 MG tablet TAKE ONE TABLET BY MOUTH TWICE DAILY 09/22/14   Biagio Borg, MD  folic acid (FOLVITE) 1 MG tablet TAKE ONE TABLET BY MOUTH ONCE DAILY 03/11/14   Biagio Borg, MD  lactulose [facility]) 10 GM/15ML solution TAKE 30 MLS BY MOUTH EVERY DAY 12/30/14   Biagio Borg, MD  Multiple Vitamin (MULTIVITAMIN) tablet Take 1 tablet by mouth daily.      Historical Provider, MD  propranolol (INDERAL) 20 MG tablet TAKE ONE TABLET BY MOUTH TWICE DAILY 09/22/14   Biagio Borg, MD  spironolactone (ALDACTONE) 50 MG tablet TAKE ONE TABLET BY MOUTH ONCE  DAILY 03/27/14   Biagio Borg, MD  thiamine (VITAMIN B-1) 100 MG tablet Take 1 tablet (100 mg total) by mouth daily. 12/27/12   Biagio Borg, MD  triamcinolone cream (KENALOG) 0.1 % Apply topically 2 (two) times daily. 07/29/13   Biagio Borg, MD   BP 217/98 mmHg  Pulse 74  Temp(Src) 97.5 F (36.4 C) (Oral)  Resp 23  Ht 5\' 3"  (1.6 m)  Wt 126 lb 8 oz (57.38 kg)  BMI 22.41 kg/m2  SpO2 99% Physical Exam  Constitutional: She is oriented to person, place, and time. She appears well-developed and well-nourished. No distress.  HENT:  Head: Normocephalic and atraumatic.  Mouth/Throat: Oropharynx is clear and  moist.  Eyes: Pupils are equal, round, and reactive to light.  Neck: Normal range of motion. Neck supple.  Cardiovascular: Normal rate, regular rhythm and normal heart sounds.  Exam reveals no gallop and no friction rub.   No murmur heard. Pulmonary/Chest: Effort normal and breath sounds normal. No respiratory distress.  Neurological: She is alert and oriented to person, place, and time. She exhibits normal muscle tone. Coordination normal.  Skin: Skin is warm and dry. No rash noted. No erythema.  Nursing note and vitals reviewed.   ED Course  Procedures (including critical care time) Labs Review Labs Reviewed  CBC WITH DIFFERENTIAL/PLATELET - Abnormal; Notable for the following:    WBC 3.4 (*)    Neutro Abs 1.6 (*)    Monocytes Relative 16 (*)    Eosinophils Relative 7 (*)    All other components within normal limits  I-STAT CHEM 8, ED - Abnormal; Notable for the following:    Sodium 120 (*)    Chloride 85 (*)    Creatinine, Ser 1.20 (*)    Hemoglobin 15.6 (*)    All other components within normal limits    Imaging Review No results found.   EKG Interpretation   Date/Time:  Friday February 26 2015 18:19:51 EST Ventricular Rate:  84 PR Interval:  170 QRS Duration: 94 QT Interval:  401 QTC Calculation: 474 R Axis:   47 Text Interpretation:  Sinus rhythm Ventricular premature complex Probable  left atrial enlargement Minimal ST depression, lateral leads Minimal ST  elevation, anterior leads Baseline wander in lead(s) V3 No significant  change since last tracing Confirmed by Winfred Leeds  MD, SAM [phone]) on  02/26/2015 6:30:05 PM      MDM   Final diagnoses:  None   Patient be admitted to the hospital for further evaluation and care of her sodium told of the plan and all questions were answered     Dalia Heading, PA-C 02/27/15 0222  Orlie Dakin, MD 02/27/15 [phone]

## 2015-02-26 NOTE — Assessment & Plan Note (Signed)
stable overall by history and exam, recent data reviewed with pt, and pt to continue medical treatment as before,  to f/u any worsening symptoms or concerns Lab Results  Component Value Date   LDLCALC 77 07/29/2013

## 2015-02-26 NOTE — Addendum Note (Signed)
Addended by: Valerie Salts on: 02/26/2015 10:39 AM   Modules accepted: Orders

## 2015-02-26 NOTE — Assessment & Plan Note (Signed)
stable overall by history and exam, recent data reviewed with pt, and pt to continue medical treatment as before,  to f/u any worsening symptoms or concerns Lab Results  Component Value Date   WBC 4.5 07/29/2013   HGB 13.8 07/29/2013   HCT 41.9 07/29/2013   PLT 153.0 07/29/2013   GLUCOSE 88 07/29/2013   CHOL 171 07/29/2013   TRIG 92.0 07/29/2013   HDL 76.10 07/29/2013   LDLCALC 77 07/29/2013   ALT 30 07/29/2013   AST 47* 07/29/2013   NA 135 07/29/2013   K 4.5 07/29/2013   CL 98 07/29/2013   CREATININE 1.1 07/29/2013   BUN 8 07/29/2013   CO2 25 07/29/2013   TSH 1.35 07/29/2013   INR 1.41 06/05/2010   HGBA1C 6.2 07/29/2013

## 2015-02-26 NOTE — ED Notes (Signed)
Pt here rt abnormal calcium lab.

## 2015-02-26 NOTE — Patient Instructions (Addendum)
You had the new Prevnar pneumonia shot today  Please continue all other medications as before, and refills have been done if requested. - the fosamax and xanax  Please have the pharmacy call with any other refills you may need.  Please continue your efforts at being more active, low cholesterol diet, and weight control.  You are otherwise up to date with prevention measures today.  Please keep your appointments with your specialists as you may have planned  Please go to the LAB in the Basement (turn left off the elevator) for the tests to be done today  You will be contacted by phone if any changes need to be made immediately.  Otherwise, you will receive a letter about your results with an explanation, but please check with MyChart first.  Please remember to sign up for MyChart if you have not done so, as this will be important to you in the future with finding out test results, communicating by private email, and scheduling acute appointments online when needed.  Please return in 6 months, or sooner if needed

## 2015-02-26 NOTE — Assessment & Plan Note (Signed)
stable overall by history and exam, recent data reviewed with pt, and pt to continue medical treatment as before,  to f/u any worsening symptoms or concerns BP Readings from Last 3 Encounters:  02/26/15 138/86  08/06/14 140/100  02/06/14 142/84

## 2015-02-26 NOTE — ED Provider Notes (Signed)
Patient called by [facility] care physician today and told to come to emergency department for abnormally hyponatremia. Patient asymptomatic. Alert no distress  Orlie Dakin, MD 02/27/15 [phone]

## 2015-02-26 NOTE — Progress Notes (Signed)
Subjective:    Patient ID: Sarah Harmon, female    DOB: September 13, 1946, 69 y.o.   MRN: [id number]  HPI   Here for yearly f/u with patient advocate;  Overall doing ok;  Pt denies Chest pain, worsening SOB, DOE, wheezing, orthopnea, PND, worsening LE edema, palpitations, dizziness or syncope.  Pt denies neurological change such as new headache, facial or extremity weakness.  Pt denies polydipsia, polyuria, or low sugar symptoms. Pt states overall good compliance with treatment and medications, good tolerability, and has been trying to follow appropriate diet.  Pt denies worsening depressive symptoms, suicidal ideation or panic. No fever, night sweats, wt loss, loss of appetite, or other constitutional symptoms.  Pt states good ability with ADL's, has low fall risk, home safety reviewed and adequate, no other significant changes in hearing or vision, and only occasionally active with exercise.Still drinks ETOH occasionally, vague on amount.  No current complaints  Denies worsening depressive symptoms, suicidal ideation, or panic; has ongoing anxiety, needs xanax refill.   Past Medical History  Diagnosis Date  . ALCOHOL ABUSE 07/13/2010  . ANXIETY 12/22/2009  . DEPRESSION 12/22/2009  . 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 FATIGUE 12/23/2010  . GALLSTONES 12/22/2009  . HYPERLIPIDEMIA 12/22/2009  . HYPERTENSION 12/22/2009  . HYPONATREMIA 12/22/2009  . 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 OSTEOPENIA 12/22/2009  . Pancytopenia 07/13/2010  . RENAL CYST, LEFT 12/22/2009  . VITAMIN D DEFICIENCY 12/23/2010  . WEIGHT LOSS 12/23/2010  . DJD (degenerative joint disease), cervical   . Renal cyst   . Gallstones    No past surgical history on file.  reports that she has never smoked. She does not have any smokeless tobacco history on file. She reports that she drinks alcohol. She reports that she does not use illicit drugs. family history includes Breast cancer in her sister. No Known Allergies Current Outpatient  Prescriptions on File Prior to Visit  Medication Sig Dispense Refill  . alendronate (FOSAMAX) 70 MG tablet Take 1 tablet (70 mg total) by mouth every 7 (seven) days. Take with a full glass of water on an empty stomach. 12 tablet 3  . ALPRAZolam (XANAX) 0.5 MG tablet TAKE ONE TABLET BY MOUTH AT BEDTIME AS NEEDED 90 tablet 1  . amLODipine (NORVASC) 5 MG tablet Take 1 tablet (5 mg total) by mouth daily. 90 tablet 3  . Calcium Carbonate-Vitamin D (CALTRATE 600+D) 600-400 MG-UNIT per tablet Take 1 tablet by mouth daily.      . Cholecalciferol (VITAMIN D3) 1000 UNITS CAPS Take by mouth daily.      . citalopram (CELEXA) 20 MG tablet Take 1 tablet (20 mg total) by mouth daily. 90 tablet 3  . cloNIDine (CATAPRES) 0.2 MG tablet TAKE ONE TABLET BY MOUTH TWICE DAILY 60 tablet 6  . folic acid (FOLVITE) 1 MG tablet TAKE ONE TABLET BY MOUTH ONCE DAILY 90 tablet 3  . lactulose (CHRONULAC) 10 GM/15ML solution TAKE 30 MLS BY MOUTH EVERY DAY 2700 mL 0  . Multiple Vitamin (MULTIVITAMIN) tablet Take 1 tablet by mouth daily.      . propranolol (INDERAL) 20 MG tablet TAKE ONE TABLET BY MOUTH TWICE DAILY 60 tablet 6  . spironolactone (ALDACTONE) 50 MG tablet TAKE ONE TABLET BY MOUTH ONCE DAILY 90 tablet 3  . thiamine (VITAMIN B-1) 100 MG tablet Take 1 tablet (100 mg total) by mouth daily. 90 tablet 3  . triamcinolone cream (KENALOG) 0.1 % Apply topically 2 (two) times daily. 30 g 0  No current facility-administered medications on file prior to visit.   Review of Systems Constitutional: Negative for increased diaphoresis, other activity, appetite or siginficant weight change other than noted HENT: Negative for worsening hearing loss, ear pain, facial swelling, mouth sores and neck stiffness.   Eyes: Negative for other worsening pain, redness or visual disturbance.  Respiratory: Negative for shortness of breath and wheezing  Cardiovascular: Negative for chest pain and palpitations.  Gastrointestinal: Negative for  diarrhea, blood in stool, abdominal distention or other pain Genitourinary: Negative for hematuria, flank pain or change in urine volume.  Musculoskeletal: Negative for myalgias or other joint complaints.  Skin: Negative for color change and wound or drainage.  Neurological: Negative for syncope and numbness. other than noted Hematological: Negative for adenopathy. or other swelling Psychiatric/Behavioral: Negative for hallucinations, SI, self-injury, decreased concentration or other worsening agitation.      Objective:   Physical Exam BP 138/86 mmHg  Pulse 72  Temp(Src) 97.9 F (36.6 C) (Oral)  Resp 18  Ht 5\' 3"  (1.6 m)  Wt 126 lb 1.3 oz (57.19 kg)  BMI 22.34 kg/m2  SpO2 98% VS noted,  Constitutional: Pt is oriented to person, place, and time. Appears well-developed and well-nourished, in no significant distress Head: Normocephalic and atraumatic.  Right Ear: External ear normal.  Left Ear: External ear normal.  Nose: Nose normal.  Mouth/Throat: Oropharynx is clear and moist.  Eyes: Conjunctivae and EOM are normal. Pupils are equal, round, and reactive to light.  Neck: Normal range of motion. Neck supple. No JVD present. No tracheal deviation present or significant neck LA or mass Cardiovascular: Normal rate, regular rhythm, normal heart sounds and intact distal pulses.   Pulmonary/Chest: Effort normal and breath sounds without rales or wheezing  Abdominal: Soft. Bowel sounds are normal. NT. No HSM  Musculoskeletal: Normal range of motion. Exhibits no edema.  Lymphadenopathy:  Has no cervical adenopathy.  Neurological: Pt is alert and oriented to person, place, and time. Pt has normal reflexes. No cranial nerve deficit. Motor grossly intact Skin: Skin is warm and dry. No rash noted.  Psychiatric:  Has normal mood and affect. Behavior is normal.      Assessment & Plan:

## 2015-02-26 NOTE — Telephone Encounter (Signed)
Critical lab at 118 sodium  cherina to call pt  Pt needs to go ER now for further evaluation and treatment

## 2015-02-26 NOTE — H&P (Signed)
[facility] History and Physical  Sarah Harmon [id number] DOB: 1946/10/31 DOA: 02/26/2015  Referring physician: ER physician. PCP: Cathlean Cower, MD   Chief Complaint: Hyponatremia.  HPI: Sarah Harmon is a 69 y.o. 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 Patient states that she does not have any symptoms. Denies any chest pain dizziness loss of consciousness shortness of breath focal deficits nausea vomiting or diarrhea. In the ER repeat metabolic panel shows sodium of 120. Patient admits to taking a lot of fluids. And states she still drinks alcohol but only once or twice a week. Patient is on spironolactone and Celexa. In addition patient's blood pressure is found to be more than [id number] systolic. Patient will be admitted for further management of her hyponatremia and uncontrolled blood pressure.   Review of Systems: As presented in the history of presenting illness, rest negative.  Past Medical History  Diagnosis Date  . ALCOHOL ABUSE 07/13/2010  . ANXIETY 12/22/2009  . DEPRESSION 12/22/2009  . 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 FATIGUE 12/23/2010  . GALLSTONES 12/22/2009  . HYPERLIPIDEMIA 12/22/2009  . HYPERTENSION 12/22/2009  . HYPONATREMIA 12/22/2009  . 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 OSTEOPENIA 12/22/2009  . Pancytopenia 07/13/2010  . RENAL CYST, LEFT 12/22/2009  . VITAMIN D DEFICIENCY 12/23/2010  . WEIGHT LOSS 12/23/2010  . DJD (degenerative joint disease), cervical   . Renal cyst   . Gallstones    History reviewed. No pertinent past surgical history. Social History:  reports that she has never smoked. She does not have any smokeless tobacco history on file. She reports that she drinks alcohol. She reports that she does not use illicit drugs. Where does patient live  home. Can patient participate in ADLs? Yes.  No Known Allergies  Family History:  Family History  Problem Relation Age of Onset  . Breast cancer Sister       Prior to Admission medications   Medication Sig Start Date End Date Taking? Authorizing Provider  alendronate (FOSAMAX) 70 MG tablet Take 1 tablet (70 mg total) by mouth every 7 (seven) days. Take with a full glass of water on an empty stomach. Patient taking differently: Take 70 mg by mouth every 7 (seven) days. Take with a full glass of water on an empty stomach. Take every Wednesday 02/26/15  Yes Biagio Borg, MD  ALPRAZolam Duanne Moron) 0.5 MG tablet TAKE ONE TABLET BY MOUTH AT BEDTIME AS NEEDED Patient taking differently: Take 0.5 mg by mouth at bedtime as needed for anxiety. TAKE ONE TABLET BY MOUTH AT BEDTIME AS NEEDED 02/26/15  Yes Biagio Borg, MD  amLODipine (NORVASC) 5 MG tablet Take 1 tablet (5 mg total) by mouth daily. 08/06/14 08/06/15 Yes Biagio Borg, MD  Calcium Carbonate-Vitamin D (CALTRATE 600+D) 600-400 MG-UNIT per tablet Take 1 tablet by mouth daily.     Yes Historical Provider, MD  Cholecalciferol (VITAMIN D3) 1000 UNITS CAPS Take 1 capsule by mouth daily.    Yes Historical Provider, MD  citalopram (CELEXA) 20 MG tablet Take 1 tablet (20 mg total) by mouth daily. 08/06/14  Yes Biagio Borg, MD  cloNIDine (CATAPRES) 0.2 MG tablet TAKE ONE TABLET BY MOUTH TWICE DAILY 09/22/14  Yes Biagio Borg, MD  folic acid (FOLVITE) 1 MG tablet TAKE ONE TABLET BY MOUTH ONCE  DAILY 03/11/14  Yes Biagio Borg, MD  ketotifen (ZADITOR) 0.025 % ophthalmic solution Place 1 drop into both eyes 2 (two) times daily.   Yes Historical Provider, MD  lactulose (CHRONULAC) 10 GM/15ML solution Take 30 g by mouth daily as needed for mild constipation.   Yes Historical Provider, MD  Multiple Vitamin (MULTIVITAMIN) tablet Take 1 tablet by mouth daily.     Yes Historical Provider, MD  Polyethyl Glycol-Propyl Glycol (SYSTANE OP) Place 1 drop into both eyes  daily.   Yes Historical Provider, MD  propranolol (INDERAL) 20 MG tablet TAKE ONE TABLET BY MOUTH TWICE DAILY 09/22/14  Yes Biagio Borg, MD  spironolactone (ALDACTONE) 50 MG tablet TAKE ONE TABLET BY MOUTH ONCE DAILY 03/27/14  Yes Biagio Borg, MD  thiamine (VITAMIN B-1) 100 MG tablet Take 1 tablet (100 mg total) by mouth daily. 12/27/12  Yes Biagio Borg, MD  triamcinolone cream (KENALOG) 0.1 % Apply topically 2 (two) times daily. Patient not taking: Reported on 02/26/2015 07/29/13   Biagio Borg, MD    Physical Exam: Filed Vitals:   02/26/15 2145 02/26/15 2215 02/26/15 2237 02/26/15 2314  BP: 221/102  207/100 217/97  Pulse: 93 97 90 114  Temp:    98.2 F (36.8 C)  TempSrc:    Oral  Resp: 24 24 18 25   Height:    5\' 3"  (1.6 m)  Weight:    57.2 kg (126 lb 1.7 oz)  SpO2: 98% 97% 98% 99%     General:  Moderately built and poorly nourished.  Eyes: Anicteric no pallor.  ENT: No discharge from the ears eyes nose and mouth.  Neck: No mass felt.  Cardiovascular: S1-S2 heard.  Respiratory: No rhonchi or crepitations.  Abdomen: Soft nontender bowel sounds present.  Skin: No rash.  Musculoskeletal: No edema.  Psychiatric: Appears normal.  Neurologic: Alert awake oriented to time place and person. Moves all extremities.  Labs on Admission:  Basic Metabolic Panel:  Recent Labs Lab 02/26/15 1043 02/26/15 1610  NA 118* 120*  K 4.6 4.4  CL 86* 85*  CO2 27  --   GLUCOSE 76 93  BUN 13 13  CREATININE 1.17 1.20*  CALCIUM 10.5  --    Liver Function Tests:  Recent Labs Lab 02/26/15 1043  AST 35  ALT 24  ALKPHOS 54  BILITOT 0.5  PROT 8.2  ALBUMIN 3.7   No results for input(s): LIPASE, AMYLASE in the last 168 hours. No results for input(s): AMMONIA in the last 168 hours. CBC:  Recent Labs Lab 02/26/15 1043 02/26/15 1556 02/26/15 1610  WBC 3.4* 3.4*  --   NEUTROABS 1.8 1.6*  --   HGB 13.9 13.5 15.6*  HCT 40.4 37.5 46.0  MCV 93.7 89.1  --   PLT 156.0 155  --     Cardiac Enzymes: No results for input(s): CKTOTAL, CKMB, CKMBINDEX, TROPONINI in the last 168 hours.  BNP (last 3 results) No results for input(s): BNP in the last 8760 hours.  ProBNP (last 3 results) No results for input(s): PROBNP in the last 8760 hours.  CBG: No results for input(s): GLUCAP in the last 168 hours.  Radiological Exams on Admission: No results found.  EKG: Independently reviewed. Normal sinus rhythm.  Assessment/Plan Principal Problem:   Hyponatremia Active Problems:   Depression   Hypertensive urgency   1. Severe hyponatremia - given history of cirrhosis of liver and patient admitting to taking a lot of fluids at this time we  will keep patient on fluid restriction and check uric acid levels, TSH cortisol levels and urine sodium and osmolality and serum osmolality. Closely follow metabolic panel and follow sodium trends. Based on the studies ordered we will have further plans on patient's hyponatremia. Discontinue spironolactone for now and if sodium does not improve we may have to discontinue Celexa also. 2. Hypertensive urgency - I have placed patient on when necessary IV hydralazine. Continue patient's clonidine and amlodipine. If patient's blood pressure does not improve I will increase the patient's clonidine dose to 3 times daily. 3. Cirrhosis of the liver secondary to alcoholism - since patient has severe hyponatremia holding off spironolactone. May need to add Lasix. Continue lactulose. Closely follow intake and output. 4. History of alcoholism - patient states that she only drinks once or twice a week now. Patient has been placed on when necessary Ativan along with thiamine. 5. Depression - if patient's sodium does not improve then may have to discontinue Celexa and change to another and antidepressant.   DVT Prophylaxis Lovenox.  Code Status: Full code.  Family Communication: Patient's son at the bedside.  Disposition Plan: Admit to inpatient.     Haleemah Buckalew N. [facility] Pager [phone].  If 7PM-7AM, please contact night-coverage [email or web address] Password [facility] 02/26/2015, 11:42 PM

## 2015-02-26 NOTE — ED Notes (Signed)
Pt st's she was seen by her MD today and was called later and told to come to ED ref abnormal sodium level

## 2015-02-26 NOTE — ED Notes (Signed)
Admitting MD at bedside.

## 2015-02-26 NOTE — ED Provider Notes (Signed)
CSN: [id number]     Arrival date & time 02/26/15  1525 History   First MD Initiated Contact with Patient 02/26/15 1824     Chief Complaint  Patient presents with  . Abnormal Lab     (Consider location/radiation/quality/duration/timing/severity/associated sxs/prior Treatment) HPI  Past Medical History  Diagnosis Date  . ALCOHOL ABUSE 07/13/2010  . ANXIETY 12/22/2009  . DEPRESSION 12/22/2009  . 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 FATIGUE 12/23/2010  . GALLSTONES 12/22/2009  . HYPERLIPIDEMIA 12/22/2009  . HYPERTENSION 12/22/2009  . HYPONATREMIA 12/22/2009  . 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 OSTEOPENIA 12/22/2009  . Pancytopenia 07/13/2010  . RENAL CYST, LEFT 12/22/2009  . VITAMIN D DEFICIENCY 12/23/2010  . WEIGHT LOSS 12/23/2010  . DJD (degenerative joint disease), cervical   . Renal cyst   . Gallstones    History reviewed. No pertinent past surgical history. Family History  Problem Relation Age of Onset  . Breast cancer Sister    History  Substance Use Topics  . Smoking status: Never Smoker   . Smokeless tobacco: Not on file  . Alcohol Use: Yes   OB History    No data available     Review of Systems    Allergies  Review of patient's allergies indicates no known allergies.  Home Medications   Prior to Admission medications   Medication Sig Start Date End Date Taking? Authorizing Provider  alendronate (FOSAMAX) 70 MG tablet Take 1 tablet (70 mg total) by mouth every 7 (seven) days. Take with a full glass of water on an empty stomach. 02/26/15   Biagio Borg, MD  ALPRAZolam Duanne Moron) 0.5 MG tablet TAKE ONE TABLET BY MOUTH AT BEDTIME AS NEEDED 02/26/15   Biagio Borg, MD  amLODipine (NORVASC) 5 MG tablet Take 1 tablet (5 mg total) by mouth daily. 08/06/14 08/06/15  Biagio Borg, MD  Calcium Carbonate-Vitamin D (CALTRATE 600+D) 600-400 MG-UNIT per tablet Take 1 tablet by mouth daily.      Historical Provider, MD  Cholecalciferol (VITAMIN D3) 1000 UNITS CAPS Take by mouth daily.       Historical Provider, MD  citalopram (CELEXA) 20 MG tablet Take 1 tablet (20 mg total) by mouth daily. 08/06/14   Biagio Borg, MD  cloNIDine (CATAPRES) 0.2 MG tablet TAKE ONE TABLET BY MOUTH TWICE DAILY 09/22/14   Biagio Borg, MD  folic acid (FOLVITE) 1 MG tablet TAKE ONE TABLET BY MOUTH ONCE DAILY 03/11/14   Biagio Borg, MD  lactulose [facility]) 10 GM/15ML solution TAKE 30 MLS BY MOUTH EVERY DAY 12/30/14   Biagio Borg, MD  Multiple Vitamin (MULTIVITAMIN) tablet Take 1 tablet by mouth daily.      Historical Provider, MD  propranolol (INDERAL) 20 MG tablet TAKE ONE TABLET BY MOUTH TWICE DAILY 09/22/14   Biagio Borg, MD  spironolactone (ALDACTONE) 50 MG tablet TAKE ONE TABLET BY MOUTH ONCE DAILY 03/27/14   Biagio Borg, MD  thiamine (VITAMIN B-1) 100 MG tablet Take 1 tablet (100 mg total) by mouth daily. 12/27/12   Biagio Borg, MD  triamcinolone cream (KENALOG) 0.1 % Apply topically 2 (two) times daily. 07/29/13   Biagio Borg, MD   BP 217/98 mmHg  Pulse 74  Temp(Src) 97.5 F (36.4 C) (Oral)  Resp 23  Ht 5\' 3"  (1.6 m)  Wt 126 lb 8 oz (57.38 kg)  BMI 22.41 kg/m2  SpO2 99% Physical Exam  ED Course  Procedures (including critical care time) Labs  Review Labs Reviewed  CBC WITH DIFFERENTIAL/PLATELET - Abnormal; Notable for the following:    WBC 3.4 (*)    Neutro Abs 1.6 (*)    Monocytes Relative 16 (*)    Eosinophils Relative 7 (*)    All other components within normal limits  I-STAT CHEM 8, ED - Abnormal; Notable for the following:    Sodium 120 (*)    Chloride 85 (*)    Creatinine, Ser 1.20 (*)    Hemoglobin 15.6 (*)    All other components within normal limits    Imaging Review No results found.   EKG Interpretation   Date/Time:  Friday February 26 2015 18:19:51 EST Ventricular Rate:  84 PR Interval:  170 QRS Duration: 94 QT Interval:  401 QTC Calculation: 474 R Axis:   47 Text Interpretation:  Sinus rhythm Ventricular premature complex Probable  left atrial  enlargement Minimal ST depression, lateral leads Minimal ST  elevation, anterior leads Baseline wander in lead(s) V3 No significant  change since last tracing Confirmed by Winfred Leeds  MD, Joslin Doell [phone]) on  02/26/2015 6:30:05 PM      MDM   Final diagnoses:  None    Please delete. Duplicate note    Orlie Dakin, MD 02/27/15 0225

## 2015-02-26 NOTE — Telephone Encounter (Signed)
Informed patient's daughter. Enroute to ER now.

## 2015-02-26 NOTE — Assessment & Plan Note (Signed)
stable overall by history and exam, recent data reviewed with pt, and pt to continue medical treatment as before,  to f/u any worsening symptoms or concerns Lab Results  Component Value Date   HGBA1C 6.2 07/29/2013   For f/u lab

## 2015-02-27 ENCOUNTER — Inpatient Hospital Stay (HOSPITAL_COMMUNITY): Payer: Medicare Other

## 2015-02-27 DIAGNOSIS — E222 Syndrome of inappropriate secretion of antidiuretic hormone: Secondary | ICD-10-CM | POA: Diagnosis present

## 2015-02-27 DIAGNOSIS — K7040 Alcoholic hepatic failure without coma: Secondary | ICD-10-CM | POA: Diagnosis present

## 2015-02-27 LAB — BASIC METABOLIC PANEL
Anion gap: 8 (ref 5–15)
Anion gap: 10 (ref 5–15)
Anion gap: 8 (ref 5–15)
Anion gap: 9 (ref 5–15)
BUN: 11 mg/dL (ref 6–23)
BUN: 12 mg/dL (ref 6–23)
BUN: 13 mg/dL (ref 6–23)
BUN: 14 mg/dL (ref 6–23)
CALCIUM: 9.5 mg/dL (ref 8.4–10.5)
CALCIUM: 9.3 mg/dL (ref 8.4–10.5)
CHLORIDE: 94 mmol/L — AB (ref 96–112)
CHLORIDE: 95 mmol/L — AB (ref 96–112)
CO2: 23 mmol/L (ref 19–32)
CO2: 25 mmol/L (ref 19–32)
CO2: 25 mmol/L (ref 19–32)
CO2: 25 mmol/L (ref 19–32)
CREATININE: 1.26 mg/dL — AB (ref 0.50–1.10)
CREATININE: 1.3 mg/dL — AB (ref 0.50–1.10)
CREATININE: 1.38 mg/dL — AB (ref 0.50–1.10)
Calcium: 9.8 mg/dL (ref 8.4–10.5)
Calcium: 9.4 mg/dL (ref 8.4–10.5)
Chloride: 94 mmol/L — ABNORMAL LOW (ref 96–112)
Chloride: 92 mmol/L — ABNORMAL LOW (ref 96–112)
Creatinine, Ser: 1.02 mg/dL (ref 0.50–1.10)
GFR calc Af Amer: 63 mL/min — ABNORMAL LOW (ref >90)
GFR calc Af Amer: 49 mL/min — ABNORMAL LOW (ref >90)
GFR calc Af Amer: 47 mL/min — ABNORMAL LOW (ref >90)
GFR calc Af Amer: 44 mL/min — ABNORMAL LOW (ref >90)
GFR calc non Af Amer: 41 mL/min — ABNORMAL LOW (ref >90)
GFR calc non Af Amer: 38 mL/min — ABNORMAL LOW (ref >90)
GFR, EST NON AFRICAN AMERICAN: 42 mL/min — AB (ref >90)
GFR, EST NON AFRICAN AMERICAN: 54 mL/min — AB (ref >90)
GLUCOSE: 118 mg/dL — AB (ref 70–99)
GLUCOSE: 96 mg/dL (ref 70–99)
Glucose, Bld: 101 mg/dL — ABNORMAL HIGH (ref 70–99)
Glucose, Bld: 104 mg/dL — ABNORMAL HIGH (ref 70–99)
Potassium: 4.2 mmol/L (ref 3.5–5.1)
Potassium: 4.8 mmol/L (ref 3.5–5.1)
Potassium: 4.6 mmol/L (ref 3.5–5.1)
Potassium: 4.9 mmol/L (ref 3.5–5.1)
SODIUM: 127 mmol/L — AB (ref 135–145)
Sodium: 128 mmol/L — ABNORMAL LOW (ref 135–145)
Sodium: 127 mmol/L — ABNORMAL LOW (ref 135–145)
Sodium: 126 mmol/L — ABNORMAL LOW (ref 135–145)

## 2015-02-27 LAB — CORTISOL: CORTISOL PLASMA: 19.6 ug/dL

## 2015-02-27 LAB — PROTIME-INR
INR: 1.14 (ref 0.00–1.49)
PROTHROMBIN TIME: 14.7 s (ref 11.6–15.2)

## 2015-02-27 LAB — CBC
HEMATOCRIT: 39.4 % (ref 36.0–46.0)
Hemoglobin: 14.5 g/dL (ref 12.0–15.0)
MCH: 32.7 pg (ref 26.0–34.0)
MCHC: 36.8 g/dL — ABNORMAL HIGH (ref 30.0–36.0)
MCV: 88.9 fL (ref 78.0–100.0)
Platelets: 148 10*3/uL — ABNORMAL LOW (ref 150–400)
RBC: 4.43 MIL/uL (ref 3.87–5.11)
RDW: 12.8 % (ref 11.5–15.5)
WBC: 6.1 10*3/uL (ref 4.0–10.5)

## 2015-02-27 LAB — OSMOLALITY, URINE: Osmolality, Ur: 138 mOsm/kg — ABNORMAL LOW (ref 390–1090)

## 2015-02-27 LAB — LIPASE, BLOOD: Lipase: 27 U/L (ref 11–59)

## 2015-02-27 LAB — OSMOLALITY: Osmolality: 266 mOsm/kg — ABNORMAL LOW (ref 275–300)

## 2015-02-27 LAB — SODIUM, URINE, RANDOM
SODIUM UR: 41 mmol/L
SODIUM UR: 23 mmol/L

## 2015-02-27 LAB — MRSA PCR SCREENING: MRSA by PCR: NEGATIVE

## 2015-02-27 LAB — RAPID URINE DRUG SCREEN, HOSP PERFORMED
AMPHETAMINES: NOT DETECTED
Barbiturates: NOT DETECTED
Benzodiazepines: POSITIVE — AB
Cocaine: NOT DETECTED
Opiates: NOT DETECTED
TETRAHYDROCANNABINOL: NOT DETECTED

## 2015-02-27 LAB — TSH: TSH: 0.991 u[IU]/mL (ref 0.350–4.500)

## 2015-02-27 LAB — URIC ACID: Uric Acid, Serum: 7.1 mg/dL — ABNORMAL HIGH (ref 2.4–7.0)

## 2015-02-27 MED ORDER — THIAMINE HCL 100 MG/ML IJ SOLN
100.0000 mg | Freq: Every day | INTRAMUSCULAR | Status: DC
  Filled 2015-02-27 (×4): qty 1

## 2015-02-27 MED ORDER — ADULT MULTIVITAMIN W/MINERALS CH
1.0000 | ORAL_TABLET | Freq: Every day | ORAL | Status: DC
  Administered 2015-02-27 – 2015-03-02 (×4): 1 via ORAL
  Filled 2015-02-27 (×4): qty 1

## 2015-02-27 MED ORDER — VITAMIN B-1 100 MG PO TABS
100.0000 mg | ORAL_TABLET | Freq: Every day | ORAL | Status: DC
  Administered 2015-02-27 – 2015-03-02 (×4): 100 mg via ORAL
  Filled 2015-02-27 (×4): qty 1

## 2015-02-27 MED ORDER — LORAZEPAM 2 MG/ML IJ SOLN: 1.0000 mg | Freq: Four times a day (QID) | INTRAMUSCULAR | Status: AC | PRN

## 2015-02-27 MED ORDER — FOLIC ACID 1 MG PO TABS
1.0000 mg | ORAL_TABLET | Freq: Every day | ORAL | Status: DC
  Administered 2015-02-27 – 2015-03-02 (×4): 1 mg via ORAL
  Filled 2015-02-27 (×4): qty 1

## 2015-02-27 MED ORDER — LORAZEPAM 1 MG PO TABS: 1.0000 mg | ORAL_TABLET | Freq: Four times a day (QID) | ORAL | Status: AC | PRN

## 2015-02-27 NOTE — Progress Notes (Signed)
[place] TEAM 1 - Stepdown/ICU TEAM Progress Note  Sarah Harmon [id number] DOB: [birth year] DOA: 02/26/2015 PCP: Cathlean Cower, MD  Admit HPI / Brief Narrative: Sarah Harmon is a 69 y.o. 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 Patient states that she does not have any symptoms. Denies any chest pain dizziness loss of consciousness shortness of breath focal deficits nausea vomiting or diarrhea. In the ER repeat metabolic panel shows sodium of 120. Patient admits to taking a lot of fluids. And states she still drinks alcohol but only once or twice a week. Patient is on spironolactone and Celexa. In addition patient's blood pressure is found to be more than A999333 systolic. Patient will be admitted for further management of her hyponatremia and uncontrolled blood pressure.   HPI/Subjective: 3/12  A/O 4, extremely poor insight into the reason she was admitted. 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   HTN Urgency  - Continue amlodipine 5 mg daily  -Continue Catapres 0.2 mg  BID -Continue hydralazine 10 mg PRN -Continue propranolol 20 mg BID    Code Status: FULL Family  Communication: family present at time of exam Disposition Plan: Resolution hyponatremia, completion evaluation of liver cirrhosis    Consultants: NA  Procedure/Significant Events: NA   Culture NA  Antibiotics: NA  DVT prophylaxis: SCD   Devices NA   LINES / TUBES:  NA    Continuous Infusions:   Objective: VITAL SIGNS: Temp: 98.1 F (36.7 C) (03/12 1205) Temp Source: Oral (03/12 1205) BP: 121/60 mmHg (03/12 1100) Pulse Rate: 57 (03/12 1100) SPO2; FIO2:   Intake/Output Summary (Last 24 hours) at 02/27/15 1454 Last data filed at 02/27/15 0945  Gross per 24 hour  Intake   1450 ml  Output    775 ml  Net    675 ml     Exam: General: A/O 4, extremely poor insight into the reason she was admitted. No acute respiratory distress Lungs: Clear to auscultation bilaterally without wheezes or crackles Cardiovascular: Regular rate and rhythm without murmur gallop or rub normal S1 and S2 Abdomen: Nontender, nondistended, soft, bowel sounds positive, no rebound, no ascites, no appreciable mass Extremities: No significant cyanosis, clubbing, or edema bilateral lower extremities  Data Reviewed: Basic Metabolic Panel:  Recent Labs Lab 02/26/15 1043 02/26/15 1610 02/27/15 0043 02/27/15 0310 02/27/15 0707 02/27/15 1144  NA 118* 120* 128* 127* 127* 126*  K 4.6 4.4 4.2 4.8 4.6 4.9  CL 86* 85* 95* 94* 94* 92*  CO2 27  --  25 23 25 25   GLUCOSE 76 93 118* 96 101* 104*  BUN 13 13 11 12 13 14   CREATININE 1.17 1.20* 1.02 1.26* 1.30* 1.38*  CALCIUM 10.5  --  9.8 9.5 9.3 9.4   Liver Function Tests:  Recent Labs Lab 02/26/15 1043  AST 35  ALT 24  ALKPHOS 54  BILITOT 0.5  PROT 8.2  ALBUMIN 3.7   No results for input(s): LIPASE, AMYLASE in the last 168 hours. No results for input(s): AMMONIA in the last 168 hours. CBC:  Recent Labs Lab 02/26/15 1043 02/26/15 1556 02/26/15 1610 02/27/15 0043  WBC 3.4* 3.4*  --  6.1  NEUTROABS 1.8 1.6*  --   --   HGB  13.9 13.5 15.6* 14.5  HCT 40.4 37.5 46.0 39.4  MCV 93.7 89.1  --  88.9  PLT 156.0 155  --  148*   Cardiac Enzymes: No results for input(s): CKTOTAL, CKMB, CKMBINDEX, TROPONINI in the last 168 hours. BNP (last 3 results) No results for input(s): BNP in the last 8760 hours.  ProBNP (last 3 results) No results for input(s): PROBNP in the last 8760 hours.  CBG: No results for input(s): GLUCAP in the last 168 hours.  Recent Results (from the past 240 hour(s))  MRSA PCR Screening     Status: None   Collection Time: 02/26/15 11:15 PM  Result Value Ref Range Status   MRSA by PCR NEGATIVE NEGATIVE Final    Comment:        The GeneXpert MRSA Assay (FDA approved for NASAL specimens only), is one component of a comprehensive MRSA colonization surveillance program. It is not intended to diagnose MRSA infection nor to guide or monitor treatment for MRSA infections.      Studies:  Recent x-ray studies have been reviewed in detail by the Attending Physician  Scheduled Meds:  Scheduled Meds: . amLODipine  5 mg Oral Daily  . cholecalciferol  1,000 Units Oral Daily  . citalopram  20 mg Oral Daily  . cloNIDine  0.2 mg Oral BID  . enoxaparin (LOVENOX) injection  40 mg Subcutaneous Q24H  . folic acid  1 mg Oral Daily  . ketotifen  1 drop Both Eyes BID  . multivitamin with minerals  1 tablet Oral Daily  . pneumococcal 23 valent vaccine  0.5 mL Intramuscular Tomorrow-1000  . propranolol  20 mg Oral BID  . sodium chloride  3 mL Intravenous Q12H  . thiamine  100 mg Oral Daily   Or  . thiamine  100 mg Intravenous Daily    Time spent on care of this patient: 40 mins   Farhaan Mabee, Geraldo Docker , MD  [facility] Office  [phone] Pager - [phone]  On-Call/Text Page:      [email or web address]      password [email or web address]  If 7PM-7AM, please contact night-coverage [email or web address] Password [email or web address] 02/27/2015, 2:54 PM   LOS: 1 day   Care during the described time interval was provided by me .  I  have reviewed this patient's available data, including medical history, events of note, physical examination, radiology studies and test results as part of my evaluation  Dia Crawford, MD [phone] Pager

## 2015-02-28 ENCOUNTER — Inpatient Hospital Stay (HOSPITAL_COMMUNITY): Payer: Medicare Other

## 2015-02-28 DIAGNOSIS — K7030 Alcoholic cirrhosis of liver without ascites: Secondary | ICD-10-CM | POA: Diagnosis present

## 2015-02-28 DIAGNOSIS — N2889 Other specified disorders of kidney and ureter: Secondary | ICD-10-CM | POA: Diagnosis present

## 2015-02-28 DIAGNOSIS — R160 Hepatomegaly, not elsewhere classified: Secondary | ICD-10-CM

## 2015-02-28 LAB — HEPATITIS PANEL, ACUTE
HCV AB: NEGATIVE
Hep A IgM: NONREACTIVE
Hep B C IgM: NONREACTIVE
Hepatitis B Surface Ag: NEGATIVE

## 2015-02-28 LAB — PHOSPHORUS: Phosphorus: 4 mg/dL (ref 2.3–4.6)

## 2015-02-28 LAB — HIV ANTIBODY (ROUTINE TESTING W REFLEX): HIV SCREEN 4TH GENERATION: NONREACTIVE

## 2015-02-28 LAB — CBC WITH DIFFERENTIAL/PLATELET
BASOS ABS: 0 10*3/uL (ref 0.0–0.1)
Basophils Relative: 0 % (ref 0–1)
EOS ABS: 0.1 10*3/uL (ref 0.0–0.7)
Eosinophils Relative: 5 % (ref 0–5)
HCT: 34.1 % — ABNORMAL LOW (ref 36.0–46.0)
Hemoglobin: 12.1 g/dL (ref 12.0–15.0)
Lymphocytes Relative: 32 % (ref 12–46)
Lymphs Abs: 0.9 10*3/uL (ref 0.7–4.0)
MCH: 33 pg (ref 26.0–34.0)
MCHC: 35.5 g/dL (ref 30.0–36.0)
MCV: 92.9 fL (ref 78.0–100.0)
MONO ABS: 0.5 10*3/uL (ref 0.1–1.0)
MONOS PCT: 18 % — AB (ref 3–12)
Neutro Abs: 1.3 10*3/uL — ABNORMAL LOW (ref 1.7–7.7)
Neutrophils Relative %: 45 % (ref 43–77)
Platelets: 120 10*3/uL — ABNORMAL LOW (ref 150–400)
RBC: 3.67 MIL/uL — ABNORMAL LOW (ref 3.87–5.11)
RDW: 13.2 % (ref 11.5–15.5)
WBC: 2.8 10*3/uL — AB (ref 4.0–10.5)

## 2015-02-28 LAB — COMPREHENSIVE METABOLIC PANEL
ALBUMIN: 2.7 g/dL — AB (ref 3.5–5.2)
ALT: 20 U/L (ref 0–35)
AST: 36 U/L (ref 0–37)
Alkaline Phosphatase: 39 U/L (ref 39–117)
Anion gap: 7 (ref 5–15)
BUN: 14 mg/dL (ref 6–23)
CO2: 26 mmol/L (ref 19–32)
CREATININE: 1.37 mg/dL — AB (ref 0.50–1.10)
Calcium: 9.3 mg/dL (ref 8.4–10.5)
Chloride: 91 mmol/L — ABNORMAL LOW (ref 96–112)
GFR calc non Af Amer: 38 mL/min — ABNORMAL LOW (ref >90)
GFR, EST AFRICAN AMERICAN: 44 mL/min — AB (ref >90)
GLUCOSE: 105 mg/dL — AB (ref 70–99)
POTASSIUM: 4.4 mmol/L (ref 3.5–5.1)
Sodium: 124 mmol/L — ABNORMAL LOW (ref 135–145)
TOTAL PROTEIN: 6.9 g/dL (ref 6.0–8.3)
Total Bilirubin: 0.8 mg/dL (ref 0.3–1.2)

## 2015-02-28 LAB — OSMOLALITY, URINE: OSMOLALITY UR: 170 mosm/kg — AB (ref 390–1090)

## 2015-02-28 LAB — PROTIME-INR
INR: 1.04 (ref 0.00–1.49)
PROTHROMBIN TIME: 13.7 s (ref 11.6–15.2)

## 2015-02-28 LAB — AMMONIA: Ammonia: 55 umol/L — ABNORMAL HIGH (ref 11–32)

## 2015-02-28 MED ORDER — LACTULOSE 10 GM/15ML PO SOLN
20.0000 g | Freq: Two times a day (BID) | ORAL | Status: DC
  Administered 2015-02-28 – 2015-03-02 (×4): 20 g via ORAL
  Filled 2015-02-28 (×5): qty 30

## 2015-02-28 MED ORDER — GADOXETATE DISODIUM 0.25 MMOL/ML IV SOLN
5.0000 mL | Freq: Once | INTRAVENOUS | Status: AC | PRN
  Administered 2015-02-28: 5 mL via INTRAVENOUS

## 2015-02-28 MED ORDER — WHITE PETROLATUM GEL
Status: AC
  Administered 2015-02-28: 18:00:00
  Filled 2015-02-28: qty 1

## 2015-02-28 NOTE — Progress Notes (Signed)
[place] TEAM 1 - Stepdown/ICU TEAM Progress Note  Sarah Harmon [id number] DOB: 1946/03/19 DOA: 02/26/2015 PCP: Cathlean Cower, MD  Admit HPI / Brief Narrative: Sarah Harmon is a 69 y.o. 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 Patient states that she does not have any symptoms. Denies any chest pain dizziness loss of consciousness shortness of breath focal deficits nausea vomiting or diarrhea. In the ER repeat metabolic panel shows sodium of 120. Patient admits to taking a lot of fluids. And states she still drinks alcohol but only once or twice a week. Patient is on spironolactone and Celexa. In addition patient's blood pressure is found to be more than A999333 systolic. Patient will be admitted for further management of her hyponatremia and uncontrolled blood pressure.   HPI/Subjective: 3/13  A/O 4, NAD 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Potomania -Hepatitis panel -HIV Panel -Abdominal US; evaluate extent of liver cirrhosis -Continue Pt on fluid restriction 1000 ml/dy -TSH within normal limits -Ammonia level= 55 high -Cortisol; normal -Serum osmolality= 266 -Urine osmolality= 170 low -Uric acid= 7.1 slightly high - urine sodium= 41 high   Alcoholic liver cirrhosis  -Patient and family counseled on need for abstinence -Lactulose 20 mg BID  Liver mass -MRI pending -Patient and Family aware that if either mass appears malignant on MRI will change plan of care.  Left kidney mass -MRI pending  Alcoholic -CIWA protocol -Echocardiogram; rule out alcoholic cardiomyopathy pending - Depression  -Home dose Celexa 20 mg daily, continue reduced  dose Celexa 10 mg daily as this can exacerbate SIADH   HTN Urgency  - Continue amlodipine 5 mg daily  -Continue Catapres 0.2 mg  BID -Continue hydralazine 10 mg PRN -Continue propranolol 20 mg BID    Code Status: FULL Family Communication: family present at time of exam Disposition Plan: Resolution hyponatremia, completion evaluation of liver cirrhosis    Consultants: NA  Procedure/Significant Events: 3/13 ultrasound abdomen;Cirrhotic appearing liver. - mass in RIGHT lobe 2.0 cm liver? -Cholelithiasis without evidence acute cholecystitis. -Complicated cystic lesion at upper pole LEFT kidney 2.6 cm   Culture NA  Antibiotics: NA  DVT prophylaxis: SCD   Devices NA   LINES / TUBES:  NA    Continuous Infusions:   Objective: VITAL SIGNS: Temp: 98.5 F (36.9 C) (03/13 0725) Temp Source: Oral (03/13 0725) BP: 129/74 mmHg (03/13 0728) Pulse Rate: 66 (03/13 0728) SPO2; FIO2:   Intake/Output Summary (Last 24 hours) at 02/28/15 0833 Last data filed at 02/28/15 0727  Gross per 24 hour  Intake   1080 ml  Output   1200 ml  Net   -120 ml     Exam: General: A/O 4, NAD, No acute respiratory distress Lungs: Clear to auscultation bilaterally without wheezes or crackles Cardiovascular: Regular rate and rhythm without murmur gallop or rub normal S1 and S2 Abdomen: Nontender, nondistended, soft, bowel sounds positive, no rebound, no ascites, no appreciable mass, negative CVA tenderness Extremities: No significant cyanosis, clubbing, or edema bilateral lower extremities  Data Reviewed: Basic Metabolic Panel:  Recent Labs Lab 02/27/15 0043 02/27/15 0310 02/27/15 0707 02/27/15 1144 02/28/15 0335  NA 128* 127* 127* 126* 124*  K 4.2 4.8 4.6 4.9 4.4  CL 95* 94*  94* 92* 91*  CO2 25 23 25 25 26   GLUCOSE 118* 96 101* 104* 105*  BUN 11 12 13 14 14   CREATININE 1.02 1.26* 1.30* 1.38* 1.37*  CALCIUM 9.8 9.5 9.3 9.4 9.3  PHOS  --   --   --   --  4.0   Liver  Function Tests:  Recent Labs Lab 02/26/15 1043 02/28/15 0335  AST 35 36  ALT 24 20  ALKPHOS 54 39  BILITOT 0.5 0.8  PROT 8.2 6.9  ALBUMIN 3.7 2.7*    Recent Labs Lab 02/27/15 1550  LIPASE 27   No results for input(s): AMMONIA in the last 168 hours. CBC:  Recent Labs Lab 02/26/15 1043 02/26/15 1556 02/26/15 1610 02/27/15 0043 02/28/15 0335  WBC 3.4* 3.4*  --  6.1 2.8*  NEUTROABS 1.8 1.6*  --   --  1.3*  HGB 13.9 13.5 15.6* 14.5 12.1  HCT 40.4 37.5 46.0 39.4 34.1*  MCV 93.7 89.1  --  88.9 92.9  PLT 156.0 155  --  148* 120*   Cardiac Enzymes: No results for input(s): CKTOTAL, CKMB, CKMBINDEX, TROPONINI in the last 168 hours. BNP (last 3 results) No results for input(s): BNP in the last 8760 hours.  ProBNP (last 3 results) No results for input(s): PROBNP in the last 8760 hours.  CBG: No results for input(s): GLUCAP in the last 168 hours.  Recent Results (from the past 240 hour(s))  MRSA PCR Screening     Status: None   Collection Time: 02/26/15 11:15 PM  Result Value Ref Range Status   MRSA by PCR NEGATIVE NEGATIVE Final    Comment:        The GeneXpert MRSA Assay (FDA approved for NASAL specimens only), is one component of a comprehensive MRSA colonization surveillance program. It is not intended to diagnose MRSA infection nor to guide or monitor treatment for MRSA infections.      Studies:  Recent x-ray studies have been reviewed in detail by the Attending Physician  Scheduled Meds:  Scheduled Meds: . amLODipine  5 mg Oral Daily  . cholecalciferol  1,000 Units Oral Daily  . citalopram  20 mg Oral Daily  . cloNIDine  0.2 mg Oral BID  . folic acid  1 mg Oral Daily  . ketotifen  1 drop Both Eyes BID  . multivitamin with minerals  1 tablet Oral Daily  . pneumococcal 23 valent vaccine  0.5 mL Intramuscular Tomorrow-1000  . propranolol  20 mg Oral BID  . sodium chloride  3 mL Intravenous Q12H  . thiamine  100 mg Oral Daily   Or  . thiamine   100 mg Intravenous Daily    Time spent on care of this patient: 40 mins   Sarah Harmon, Geraldo Docker , MD  [facility] Office  [phone] Pager - [phone]  On-Call/Text Page:      [email or web address]      password [email or web address]  If 7PM-7AM, please contact night-coverage [email or web address] Password [facility] 02/28/2015, 8:33 AM   LOS: 2 days   Care during the described time interval was provided by me .  I have reviewed this patient's available data, including medical history, events of note, physical examination, radiology studies and test results as part of my evaluation  Dia Crawford, MD [phone] Pager

## 2015-03-01 DIAGNOSIS — K7460 Unspecified cirrhosis of liver: Secondary | ICD-10-CM

## 2015-03-01 DIAGNOSIS — N281 Cyst of kidney, acquired: Secondary | ICD-10-CM

## 2015-03-01 DIAGNOSIS — C220 Liver cell carcinoma: Principal | ICD-10-CM

## 2015-03-01 DIAGNOSIS — I370 Nonrheumatic pulmonary valve stenosis: Secondary | ICD-10-CM

## 2015-03-01 DIAGNOSIS — D630 Anemia in neoplastic disease: Secondary | ICD-10-CM

## 2015-03-01 DIAGNOSIS — D6959 Other secondary thrombocytopenia: Secondary | ICD-10-CM

## 2015-03-01 DIAGNOSIS — D638 Anemia in other chronic diseases classified elsewhere: Secondary | ICD-10-CM

## 2015-03-01 LAB — CBC WITH DIFFERENTIAL/PLATELET
Basophils Absolute: 0 10*3/uL (ref 0.0–0.1)
Basophils Relative: 1 % (ref 0–1)
Eosinophils Absolute: 0.2 10*3/uL (ref 0.0–0.7)
Eosinophils Relative: 6 % — ABNORMAL HIGH (ref 0–5)
HEMATOCRIT: 34.7 % — AB (ref 36.0–46.0)
Hemoglobin: 11.9 g/dL — ABNORMAL LOW (ref 12.0–15.0)
LYMPHS ABS: 0.8 10*3/uL (ref 0.7–4.0)
LYMPHS PCT: 23 % (ref 12–46)
MCH: 31.9 pg (ref 26.0–34.0)
MCHC: 34.3 g/dL (ref 30.0–36.0)
MCV: 93 fL (ref 78.0–100.0)
MONO ABS: 0.6 10*3/uL (ref 0.1–1.0)
Monocytes Relative: 18 % — ABNORMAL HIGH (ref 3–12)
Neutro Abs: 1.7 10*3/uL (ref 1.7–7.7)
Neutrophils Relative %: 53 % (ref 43–77)
PLATELETS: 114 10*3/uL — AB (ref 150–400)
RBC: 3.73 MIL/uL — AB (ref 3.87–5.11)
RDW: 13.4 % (ref 11.5–15.5)
WBC: 3.3 10*3/uL — AB (ref 4.0–10.5)

## 2015-03-01 LAB — AMMONIA: AMMONIA: 19 umol/L (ref 11–32)

## 2015-03-01 LAB — COMPREHENSIVE METABOLIC PANEL
ALBUMIN: 2.9 g/dL — AB (ref 3.5–5.2)
ALK PHOS: 39 U/L (ref 39–117)
ALT: 20 U/L (ref 0–35)
AST: 31 U/L (ref 0–37)
Anion gap: 5 (ref 5–15)
BILIRUBIN TOTAL: 0.8 mg/dL (ref 0.3–1.2)
BUN: 13 mg/dL (ref 6–23)
CHLORIDE: 94 mmol/L — AB (ref 96–112)
CO2: 29 mmol/L (ref 19–32)
Calcium: 9.7 mg/dL (ref 8.4–10.5)
Creatinine, Ser: 1.4 mg/dL — ABNORMAL HIGH (ref 0.50–1.10)
GFR calc non Af Amer: 37 mL/min — ABNORMAL LOW (ref >90)
GFR, EST AFRICAN AMERICAN: 43 mL/min — AB (ref >90)
GLUCOSE: 112 mg/dL — AB (ref 70–99)
Potassium: 4.3 mmol/L (ref 3.5–5.1)
SODIUM: 128 mmol/L — AB (ref 135–145)
TOTAL PROTEIN: 7.1 g/dL (ref 6.0–8.3)

## 2015-03-01 LAB — PROTIME-INR
INR: 1.11 (ref 0.00–1.49)
PROTHROMBIN TIME: 14.4 s (ref 11.6–15.2)

## 2015-03-01 LAB — RAPID HIV SCREEN (WH-MAU): Rapid HIV Screen: UNDETERMINED — AB

## 2015-03-01 MED ORDER — AMLODIPINE BESYLATE 10 MG PO TABS
10.0000 mg | ORAL_TABLET | Freq: Every day | ORAL | Status: DC
  Administered 2015-03-02: 10 mg via ORAL
  Filled 2015-03-01: qty 1

## 2015-03-01 MED ORDER — ACETAMINOPHEN 325 MG PO TABS: 325.0000 mg | ORAL_TABLET | Freq: Four times a day (QID) | ORAL | Status: DC | PRN

## 2015-03-01 MED ORDER — CITALOPRAM HYDROBROMIDE 10 MG PO TABS
10.0000 mg | ORAL_TABLET | Freq: Every day | ORAL | Status: DC
  Administered 2015-03-02: 10 mg via ORAL
  Filled 2015-03-01: qty 1

## 2015-03-01 MED ORDER — CLONIDINE HCL 0.2 MG PO TABS
0.2000 mg | ORAL_TABLET | Freq: Three times a day (TID) | ORAL | Status: DC
  Administered 2015-03-01 – 2015-03-02 (×2): 0.2 mg via ORAL
  Filled 2015-03-01 (×4): qty 1

## 2015-03-01 NOTE — Progress Notes (Addendum)
[place] TEAM 1 - Stepdown/ICU TEAM Progress Note  Sarah Harmon [id number] DOB: [birth year] DOA: 02/26/2015 PCP: Cathlean Cower, MD  Admit HPI / Brief Narrative: 69 y.o. 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 Patient stated she did not have any symptoms. In addition patient's blood pressure was found to be more than A999333 systolic.   HPI/Subjective: Discussed findings on MRI w/ pt and daughter at length.  Oncology consulted.  Pt has no new complaints today.  Assessment/Plan:  Severe hyponatremia -most likely beer drinkers potomania + cirrhosis - home aldactone will also make accurate diagnosis based on labs difficult - Na is improving - cont to follow   Alcoholic liver cirrhosis  -Patient and family counseled on need for abstinence -Lactulose 20 mg BID  Hepatocellular Carcinoma -MRI diagnostic - 1.5cm lesion in R hepatic lobe - Oncology consulted - discussed diagnosis at length w/ pt and daughter at bedside - Oncology has asked for tissue diagnosis, therefore IR consulted   Left kidney mass -MRI suggests this is a simple cyst  Alcoholic -CIWA protocol -Echocardiogram; rule out alcoholic cardiomyopathy pending  Depression  -Home dose Celexa 20 mg daily, continue reduced dose Celexa 10 mg daily as this can exacerbate SIADH   HTN Urgency -BP now better controlled - follow w/o change today   Code Status: FULL Family Communication: family present at time of exam Disposition Plan: Resolution hyponatremia, completion evaluation of liver cirrhosis  Consultants: Oncology   Antibiotics: NA  DVT prophylaxis: SCD  Objective: Blood pressure 128/65, pulse 56, temperature 98 F (36.7 C), temperature source Oral, resp. rate 20, height 5\' 3"  (1.6 m), weight 57.8 kg (127 lb 6.8  oz), SpO2 96 %.  Intake/Output Summary (Last 24 hours) at 03/01/15 1452 Last data filed at 03/01/15 1400  Gross per 24 hour  Intake    720 ml  Output    900 ml  Net   -180 ml   Exam: General: No acute respiratory distress Lungs: Clear to auscultation bilaterally without wheezes or crackles Cardiovascular: Regular rate and rhythm without murmur gallop or rub  Abdomen: Nontender, nondistended, soft, bowel sounds positive, no rebound, no ascites, no appreciable mass Extremities: No significant cyanosis, clubbing, or edema bilateral lower extremities  Data Reviewed: Basic Metabolic Panel:  Recent Labs Lab 02/27/15 0310 02/27/15 0707 02/27/15 1144 02/28/15 0335 03/01/15 0352  NA 127* 127* 126* 124* 128*  K 4.8 4.6 4.9 4.4 4.3  CL 94* 94* 92* 91* 94*  CO2 23 25 25 26 29   GLUCOSE 96 101* 104* 105* 112*  BUN 12 13 14 14 13   CREATININE 1.26* 1.30* 1.38* 1.37* 1.40*  CALCIUM 9.5 9.3 9.4 9.3 9.7  PHOS  --   --   --  4.0  --    Liver Function Tests:  Recent Labs Lab 02/26/15 1043 02/28/15 0335 03/01/15 0352  AST 35 36 31  ALT 24 20 20   ALKPHOS 54 39 39  BILITOT 0.5 0.8 0.8  PROT 8.2 6.9 7.1  ALBUMIN 3.7 2.7* 2.9*    Recent Labs Lab 02/27/15 1550  LIPASE 27    Recent Labs Lab 02/28/15 0852 03/01/15 0352  AMMONIA 55* 19   CBC:  Recent Labs Lab 02/26/15 1043 02/26/15 1556 02/26/15 1610 02/27/15 0043 02/28/15 0335 03/01/15 0352  WBC 3.4* 3.4*  --  6.1 2.8* 3.3*  NEUTROABS 1.8 1.6*  --   --  1.3* 1.7  HGB 13.9 13.5 15.6* 14.5 12.1 11.9*  HCT 40.4 37.5 46.0 39.4 34.1* 34.7*  MCV 93.7 89.1  --  88.9 92.9 93.0  PLT 156.0 155  --  148* 120* 114*    Recent Results (from the past 240 hour(s))  MRSA PCR Screening     Status: None   Collection Time: 02/26/15 11:15 PM  Result Value Ref Range Status   MRSA by PCR NEGATIVE NEGATIVE Final    Comment:        The GeneXpert MRSA Assay (FDA approved for NASAL specimens only), is one component of  a comprehensive MRSA colonization surveillance program. It is not intended to diagnose MRSA infection nor to guide or monitor treatment for MRSA infections.      Studies:  Recent x-ray studies have been reviewed in detail by the Attending Physician  Scheduled Meds:  Scheduled Meds: . amLODipine  5 mg Oral Daily  . cholecalciferol  1,000 Units Oral Daily  . citalopram  20 mg Oral Daily  . cloNIDine  0.2 mg Oral BID  . folic acid  1 mg Oral Daily  . ketotifen  1 drop Both Eyes BID  . lactulose  20 g Oral BID  . multivitamin with minerals  1 tablet Oral Daily  . pneumococcal 23 valent vaccine  0.5 mL Intramuscular Tomorrow-1000  . propranolol  20 mg Oral BID  . sodium chloride  3 mL Intravenous Q12H  . thiamine  100 mg Oral Daily   Or  . thiamine  100 mg Intravenous Daily    Time spent on care of this patient: 35 mins  Cherene Altes, MD [facility] For Consults/Admissions - Flow Manager - [phone] Office  [phone]  Contact MD directly via text page:      [email or web address]      password [email or web address]  03/01/2015, 2:52 PM   LOS: 3 days

## 2015-03-01 NOTE — Progress Notes (Signed)
  Echocardiogram 2D Echocardiogram has been performed.  Sarah Harmon 03/01/2015, 8:55 AM

## 2015-03-01 NOTE — Consult Note (Signed)
[place]  Telephone:(336) [place] NOTE  Sarah Harmon                                MR#: [id number]  DOB: 02/18/1946                       CSN#: [id number]  Referring MD: [facility]   Patient Care Team: Biagio Borg, MD as PCP - General  Reason for Consult: Hepatocellular Carcinoma   [id number] [name] is a 69 y.o. [place] woman with a history of liver cirrhosis and ETOH abuse,admitted on 02/26/15 after being referred to the ED by her PCP for asymptomatic hyponatremia (sodium of 118) with uncontrolled hypertension. She received supportive care to normalize her electrolytes after labs were obtained. Abdominal ultrasound was ordered to evaluate the extent of her cirrhosis. This showed an abnormal 2 cm  liver mass and a complex cystic LEFT kidney lesion measuring 2.6 cm  which was followed by an MRI of the liver on 02/28/15, revealing a 1.5 cm hyperenhancing T2 bright lesion within the right hepatic lobe, diagnostic of hepatocellular carcinoma. Also seen, an indeterminate 10 mm T2 bright lesion within the hepatic dome. No biopsy has been performed to date for tissue diagnosis.No other staging CTs or tumor markers were obtained. Hep panel is negative. HIV is non reactive. She denied abdominal pain, nausea, vomiting, constipation, early satiation, but did have a 10 lb weight loss over the last 6 months.She denied pruritus, fatigue or confusion. She denied shortness of breath or chest pain. She denies any history of pancreatitis or diabetes. She denies any family history of liver cancer.  We were asked to see the patient in consultation with recommendations          PMH:  Past Medical History  Diagnosis Date  . ALCOHOL ABUSE 07/13/2010  . ANXIETY 12/22/2009  . DEPRESSION 12/22/2009  . 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 FATIGUE 12/23/2010  . GALLSTONES 12/22/2009  . HYPERLIPIDEMIA 12/22/2009  . HYPERTENSION 12/22/2009  .  HYPONATREMIA 12/22/2009  . 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 OSTEOPENIA 12/22/2009  . Pancytopenia 07/13/2010  . RENAL CYST, LEFT 12/22/2009  . VITAMIN D DEFICIENCY 12/23/2010  . WEIGHT LOSS 12/23/2010  . DJD (degenerative joint disease), cervical   . Renal cyst   . Gallstones     Surgeries:  History reviewed. No pertinent past surgical history.  Allergies: No Known Allergies  Medications:   Prior to Admission:  Prescriptions prior to admission  Medication Sig Dispense Refill Last Dose  . alendronate (FOSAMAX) 70 MG tablet Take 1 tablet (70 mg total) by mouth every 7 (seven) days. Take with a full glass of water on an empty stomach. (Patient taking differently: Take 70 mg by mouth every 7 (seven) days. Take with a full glass of water on an empty stomach. Take every Wednesday) 12 tablet 3 Past Week at Unknown time  . ALPRAZolam (XANAX) 0.5 MG tablet TAKE ONE TABLET BY MOUTH AT BEDTIME AS NEEDED (Patient taking differently: Take 0.5 mg by mouth at bedtime as needed for anxiety. TAKE ONE TABLET BY MOUTH AT BEDTIME AS NEEDED) 90 tablet 1 02/25/2015 at Unknown time  . amLODipine (NORVASC) 5 MG tablet Take 1 tablet (5 mg total) by mouth daily. 90 tablet 3 02/26/2015 at Unknown time  . Calcium Carbonate-Vitamin D (CALTRATE 600+D) 600-400 MG-UNIT  per tablet Take 1 tablet by mouth daily.     02/26/2015 at Unknown time  . Cholecalciferol (VITAMIN D3) 1000 UNITS CAPS Take 1 capsule by mouth daily.    02/26/2015 at Unknown time  . citalopram (CELEXA) 20 MG tablet Take 1 tablet (20 mg total) by mouth daily. 90 tablet 3 02/26/2015 at Unknown time  . cloNIDine (CATAPRES) 0.2 MG tablet TAKE ONE TABLET BY MOUTH TWICE DAILY 60 tablet 6 02/26/2015 at Unknown time  . folic acid (FOLVITE) 1 MG tablet TAKE ONE TABLET BY MOUTH ONCE DAILY 90 tablet 3 02/26/2015 at Unknown time  . ketotifen (ZADITOR) 0.025 % ophthalmic solution Place 1 drop into both eyes 2 (two) times daily.   02/26/2015 at Unknown time  . lactulose (CHRONULAC)  10 GM/15ML solution Take 30 g by mouth daily as needed for mild constipation.   Past Week at Unknown time  . Multiple Vitamin (MULTIVITAMIN) tablet Take 1 tablet by mouth daily.     02/26/2015 at Unknown time  . Polyethyl Glycol-Propyl Glycol (SYSTANE OP) Place 1 drop into both eyes daily.   02/26/2015 at Unknown time  . propranolol (INDERAL) 20 MG tablet TAKE ONE TABLET BY MOUTH TWICE DAILY 60 tablet 6 02/26/2015 at 0800  . spironolactone (ALDACTONE) 50 MG tablet TAKE ONE TABLET BY MOUTH ONCE DAILY 90 tablet 3 02/26/2015 at Unknown time  . thiamine (VITAMIN B-1) 100 MG tablet Take 1 tablet (100 mg total) by mouth daily. 90 tablet 3 02/26/2015 at Unknown time  . triamcinolone cream (KENALOG) 0.1 % Apply topically 2 (two) times daily. (Patient not taking: Reported on 02/26/2015) 30 g 0 Not Taking at Unknown time    Scheduled Meds: . amLODipine  5 mg Oral Daily  . cholecalciferol  1,000 Units Oral Daily  . citalopram  20 mg Oral Daily  . cloNIDine  0.2 mg Oral BID  . folic acid  1 mg Oral Daily  . ketotifen  1 drop Both Eyes BID  . lactulose  20 g Oral BID  . multivitamin with minerals  1 tablet Oral Daily  . pneumococcal 23 valent vaccine  0.5 mL Intramuscular Tomorrow-1000  . propranolol  20 mg Oral BID  . sodium chloride  3 mL Intravenous Q12H  . thiamine  100 mg Oral Daily   Or  . thiamine  100 mg Intravenous Daily   Continuous Infusions:  PRN Meds:.acetaminophen **OR** acetaminophen, ALPRAZolam, hydrALAZINE, LORazepam **OR** LORazepam, ondansetron **OR** ondansetron (ZOFRAN) IV  ROS: Constitutional: Denies fevers, chills or abnormal night sweats Eyes: Denies blurriness of vision, double vision or watery eyes.  Ears, nose, mouth, throat, and face: Denies mucositis or sore throat Respiratory: Denies cough, dyspnea or wheezes Cardiovascular: Denies palpitation, chest discomfort or lower extremity swelling Gastrointestinal:  Denies nausea, heartburn or change in bowel habits Skin: Denies  abnormal skin rashes. pruritus Lymphatics: Denies new lymphadenopathy or easy bruising Neurological:Denies numbness, tingling or new weaknesses Behavioral/Psych: Mood is stable, no new changes  All other systems were reviewed with the patient and are negative.   Family History:    Family History  Problem Relation Age of Onset  . Breast cancer Sister    Father died with Cancer of unknown type She had 2 siblings with a history of breast cancer  No family history of hematological  Disorders or GI cancers.  Social History: Lives in [place], 4 children in good health. Widowed. Originally form [place]. Retired from a [employer]. Never used tobacco. She drinks about 3-4 beers a  day, she drank heavier amounts prior. No history of recreational drug use.   Physical Exam    ECOG PERFORMANCE STATUS: 1   Filed Vitals:   03/01/15 1209  BP: 128/65  Pulse: 56  Temp: 98 F (36.7 C)  Resp: 20   Filed Weights   02/27/15 0416 02/28/15 0343 03/01/15 0500  Weight: 128 lb 15.5 oz (58.5 kg) 127 lb 10.3 oz (57.9 kg) 127 lb 6.8 oz (57.8 kg)    GENERAL:alert, no distress and comfortable SKIN: skin color, texture, turgor are normal, no rashes or significant lesions EYES: normal, conjunctiva are pink and non-injected, sclera clear OROPHARYNX:no exudate, no erythema and lips, buccal mucosa, and tongue normal. Poor dentition NECK: supple, thyroid normal size, non-tender, without nodularity LYMPH:  no palpable lymphadenopathy in the cervical, axillary or inguinal area LUNGS: clear to auscultation and percussion with normal breathing effort HEART: regular rate & rhythm and no murmurs and no lower extremity edema ABDOMEN:abdomen soft, moderately obese, non-tender and normal bowel sounds Musculoskeletal:no cyanosis of digits and no clubbing  PSYCH: alert & oriented x 3 with fluent speech NEURO: no focal motor/sensory deficits   Labs:  CBC   Recent Labs Lab 02/26/15 1043 02/26/15 1556  02/26/15 1610 02/27/15 0043 02/28/15 0335 03/01/15 0352  WBC 3.4* 3.4*  --  6.1 2.8* 3.3*  HGB 13.9 13.5 15.6* 14.5 12.1 11.9*  HCT 40.4 37.5 46.0 39.4 34.1* 34.7*  PLT 156.0 155  --  148* 120* 114*  MCV 93.7 89.1  --  88.9 92.9 93.0  MCH  --  32.1  --  32.7 33.0 31.9  MCHC 34.5 36.0  --  36.8* 35.5 34.3  RDW 13.9 12.9  --  12.8 13.2 13.4  LYMPHSABS 0.9 1.0  --   --  0.9 0.8  MONOABS 0.5 0.5  --   --  0.5 0.6  EOSABS 0.3 0.2  --   --  0.1 0.2  BASOSABS 0.0 0.0  --   --  0.0 0.0     CMP    Recent Labs Lab 02/26/15 1043  02/27/15 0310 02/27/15 0707 02/27/15 1144 02/28/15 0335 03/01/15 0352  NA 118*  < > 127* 127* 126* 124* 128*  K 4.6  < > 4.8 4.6 4.9 4.4 4.3  CL 86*  < > 94* 94* 92* 91* 94*  CO2 27  < > 23 25 25 26 29   GLUCOSE 76  < > 96 101* 104* 105* 112*  BUN 13  < > 12 13 14 14 13   CREATININE 1.17  < > 1.26* 1.30* 1.38* 1.37* 1.40*  CALCIUM 10.5  < > 9.5 9.3 9.4 9.3 9.7  AST 35  --   --   --   --  36 31  ALT 24  --   --   --   --  20 20  ALKPHOS 54  --   --   --   --  39 39  BILITOT 0.5  --   --   --   --  0.8 0.8  < > = values in this interval not displayed.      Component Value Date/Time   BILITOT 0.8 03/01/2015 0352   BILIDIR 0.1 02/26/2015 1043   IBILI 0.6 08/07/2007 1155      Recent Labs Lab 02/27/15 1915 02/28/15 0335 03/01/15 0352  INR 1.14 1.04 1.11    No results for input(s): DDIMER in the last 72 hours.   Anemia panel:  No results for input(s):  VITAMINB12, FOLATE, FERRITIN, TIBC, IRON, RETICCTPCT in the last 72 hours.   Imaging Studies:  US Abdomen Complete  02/28/2015   CLINICAL DATA:  Alcoholic liver failure, alcohol abuse, hypertension, Laennec's cirrhosis, history pancreatitis, cirrhosis  EXAM: ULTRASOUND ABDOMEN COMPLETE  COMPARISON:  06/06/2010  FINDINGS: Gallbladder: Multiple shadowing calculi in gallbladder up to 1.8 cm diameter. Small amount of gallbladder sludge. No gallbladder wall thickening, pericholecystic fluid or  sonographic Murphy sign.  Common bile duct: Diameter: 5 mm diameter normal  Liver: Heterogeneous with slightly nodular margins consistent with alcoholic cirrhosis. Heterogeneous area of hypo echogenicity in RIGHT lobe 1.7 x 2.0 x 2.0 cm unable to exclude mass/ tumor.  IVC: Normal appearance  Pancreas: Tail suboptimally visualized due to bowel gas, remaining portions normal appearance  Spleen: Normal appearance, 4.6 cm length  Right Kidney: Length: 9.5 cm. Normal cortical thickness. Minimally increased cortical echogenicity. No mass or hydronephrosis.  Left Kidney: Length: 10.4 cm. Normal cortical thickness. Increased cortical echogenicity. Complicated cyst at upper pole 2.6 x 2.0 x 2.3 cm. No hydronephrosis.  Abdominal aorta: Normal caliber with atherosclerotic calcifications.  Other findings: No ascites  IMPRESSION: Cirrhotic appearing liver with question mass in RIGHT lobe 2.0 cm greatest size; followup MR imaging recommended to assess.  Cholelithiasis without evidence acute cholecystitis.  Complicated cystic lesion at upper pole LEFT kidney 2.6 cm greatest size; this can be assessed at same time an as assessment of liver lesion.  Question medical renal disease changes, recommend correlation with renal function testing.   Electronically Signed   By: Lavonia Dana M.D.   On: 02/28/2015 09:09   Mr Liver W Wo Contrast  03/01/2015   CLINICAL DATA:  Patient with history of hepatic cirrhosis secondary to alcoholism.  EXAM: MRI ABDOMEN WITHOUT AND WITH CONTRAST  TECHNIQUE: Multiplanar multisequence MR imaging of the abdomen was performed both before and after the administration of intravenous contrast.  CONTRAST:  5 mm Eovist  COMPARISON:  Ultrasound 02/28/2015  FINDINGS: Lower chest:  No definite pleural effusion.  Normal heart size.  Hepatobiliary: Within the peripheral right hepatic lobe there is a 1.5 cm intermediate T2 bright hyperenhancing lesion with rapid washout (image 25 ; series 1301) and peripheral rim  enhancement.  Within the hepatic dome (image 4; series 16) (image 3; series 19) there is a 10 mm T2 bright lesion without definitive enhancement demonstrated on postcontrast images. The lesion is able to be visualized on the 20 minutes delayed sequence (image 16; series 21).  The liver is nodular in contour and cirrhotic in morphology. The left hepatic lobe is heterogeneous over the post contrast enhanced phases. There is a large gallstone demonstrated within the gallbladder lumen. No gallbladder wall thickening. No intrahepatic or extrahepatic biliary ductal dilatation.  Pancreas: Unremarkable  Spleen: Unremarkable  Adrenals/Urinary Tract: Normal bilateral adrenal glands. Kidneys enhance symmetrically with contrast. 3.8 x 2.2 cm multiloculated cyst within the superior pole of the left kidney, grossly stable dating back to 2008. No hydronephrosis.  Stomach/Bowel: Visualized small large bowel is unremarkable.  Vascular/Lymphatic: Visualized abdominal aorta is normal in caliber. No retroperitoneal lymphadenopathy.  Other: None  Musculoskeletal: Normal osseous marrow signal.  IMPRESSION: There is a 1.5 cm hyperenhancing T2 bright lesion within the right hepatic lobe which demonstrates washout. Imaging findings in the setting of cirrhosis are diagnostic of hepatocellular carcinoma. In not already obtained, multidisciplinary (surgical, oncological and interventional radiology) consultation is recommended.  There is an indeterminate 10 mm T2 bright lesion within the hepatic dome which is confirmed on  the 20 minutes post delayed images. No definite arterial phase enhancement is able to be demonstrated however evaluation of the hepatic dome is limited secondary to motion artifact.  Cholelithiasis.   Electronically Signed   By: Lovey Newcomer M.D.   On: 03/01/2015 10:20   Dg Chest Port 1 View  02/27/2015   CLINICAL DATA:  Hypernatremia, shortness of Breath  EXAM: PORTABLE CHEST - 1 VIEW  COMPARISON:  06/04/2010  FINDINGS:  Lungs clear. Heart size upper limits normal. Atheromatous aorta. No pneumothorax. No effusion. Visualized skeletal structures are unremarkable.  IMPRESSION: No acute cardiopulmonary disease.   Electronically Signed   By: Lucrezia Europe M.D.   On: 02/27/2015 10:46      Assessment/Plan: 69 y.o.  Likely Hepatocellular Carcinoma ([place]) Left Complex cystic Renal mass Patient was found per MRI of the liver to have  a 1.5 cm hyperenhancing T2 bright lesion within the right hepatic lobe, diagnostic of hepatocellular carcinoma. Also seen, an indeterminate 10 mm T2 bright lesion within the hepatic dome.  MRI of the kidney pending No biopsy has been performed to date for tissue diagnosis. 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 No transfusion is indicated at this time Monitor counts closely Transfuse blood to maintain a Hb of 8 g or if the patient is acutely bleeding  Thrombocytopenia This is due to malignancy, liver cirrhosis, alcohol Monitor counts closely No transfusion is indicated at this time Transfuse 1 unit of platelets if count is less or equal than 10,000 or 20,000 if the patient is acutely bleeding DVT prophylaxis on mechanical devices  Malnutrition Consider Nutrition evaluation  Full Code   Other medical issues including hypertensive emergency, depression  as per admitting team     [facility], PA-C 03/01/2015 4:13 PM   ADDENDUM: 69 y/o [place] woman with a history of alcoholic cirrhosis, now with a 1.5 cm Right hepatic lobe lesion felt to be diagnostic on MRI for hepatocellular carcinoma. AFP is pending.  I have placed a consult to general surgery and once creatinine  normalizes would proceed to CT chest, abd and pelvis to complete staging. From a medical oncology point of view the patient will be followed as outpatient by Dr Truitt Merle, one of our GI oncology specialists, and I will schedule that appointment. Otherwise agree with the supportive measures already undertaken.  Please let me know if I can be of further help at this point.  Chauncey Cruel, MD Medical Oncology and Hematology [facility] [address] [place], [place] [address] Tel. [phone]    Fax. [phone]

## 2015-03-01 NOTE — Progress Notes (Signed)
Medicare Important Message given? YES (If response is "NO", the following Medicare IM given date fields will be blank) Date Medicare IM given:03/01/2015 Medicare IM given by: Whitman Hero

## 2015-03-01 NOTE — Progress Notes (Signed)
Hydralazine iv given 

## 2015-03-02 DIAGNOSIS — C220 Liver cell carcinoma: Secondary | ICD-10-CM | POA: Diagnosis present

## 2015-03-02 DIAGNOSIS — R160 Hepatomegaly, not elsewhere classified: Secondary | ICD-10-CM | POA: Diagnosis present

## 2015-03-02 DIAGNOSIS — C801 Malignant (primary) neoplasm, unspecified: Secondary | ICD-10-CM | POA: Diagnosis present

## 2015-03-02 LAB — BASIC METABOLIC PANEL
ANION GAP: 8 (ref 5–15)
BUN: 13 mg/dL (ref 6–23)
CALCIUM: 9.1 mg/dL (ref 8.4–10.5)
CO2: 23 mmol/L (ref 19–32)
Chloride: 94 mmol/L — ABNORMAL LOW (ref 96–112)
Creatinine, Ser: 1.56 mg/dL — ABNORMAL HIGH (ref 0.50–1.10)
GFR calc Af Amer: 38 mL/min — ABNORMAL LOW (ref >90)
GFR calc non Af Amer: 33 mL/min — ABNORMAL LOW (ref >90)
Glucose, Bld: 111 mg/dL — ABNORMAL HIGH (ref 70–99)
POTASSIUM: 4.3 mmol/L (ref 3.5–5.1)
SODIUM: 125 mmol/L — AB (ref 135–145)

## 2015-03-02 LAB — CBC
HCT: 33.3 % — ABNORMAL LOW (ref 36.0–46.0)
HEMOGLOBIN: 11.3 g/dL — AB (ref 12.0–15.0)
MCH: 31.5 pg (ref 26.0–34.0)
MCHC: 33.9 g/dL (ref 30.0–36.0)
MCV: 92.8 fL (ref 78.0–100.0)
Platelets: 130 10*3/uL — ABNORMAL LOW (ref 150–400)
RBC: 3.59 MIL/uL — AB (ref 3.87–5.11)
RDW: 13.1 % (ref 11.5–15.5)
WBC: 2.9 10*3/uL — ABNORMAL LOW (ref 4.0–10.5)

## 2015-03-02 MED ORDER — CITALOPRAM HYDROBROMIDE 20 MG PO TABS: 10.0000 mg | ORAL_TABLET | Freq: Every day | ORAL | Status: DC

## 2015-03-02 MED ORDER — AMLODIPINE BESYLATE 5 MG PO TABS: 10.0000 mg | ORAL_TABLET | Freq: Every day | ORAL | Status: DC

## 2015-03-02 MED ORDER — CLONIDINE HCL 0.2 MG PO TABS: 0.2000 mg | ORAL_TABLET | Freq: Three times a day (TID) | ORAL | Status: DC

## 2015-03-02 MED ORDER — LACTULOSE 10 GM/15ML PO SOLN: 20.0000 g | Freq: Two times a day (BID) | ORAL | Status: DC

## 2015-03-02 NOTE — Progress Notes (Signed)
ADDENDUM: Met with patient this AM and discussed her situation. She admits to drinking "abut 4 beers a day." We reviewed her hepatitis studies, which are negative (no hep BsAg or cAb, no HCV; also negative HIV). We discussed discontinuation of alcohol intake.  Discussed her support: at home she lives w her son Sonia Side, currently unemployed. Daughter [name] is also in town and is a Engineer, maintenance. Son Charlotte Crumb works 3d shift and son Milbert Coulter lives in [place]. The patient has 28 grandchildren and 4 great-grandchildren.  She understands she likely has cancer of the liver, and that she will need staging studies (once creatinine improves; otherwise she may have a PET scan as outpatient). She will be seen by surgery this admission and after discharge she will see my GI oncology partner Dr Burr Medico-- I will call the patient with the appointment. Her case will be discussed at the GI multidisciplinary tumor conference where a treatment plan can be sketched out.   Will sign off at this time. Please let me know if I can be of further help.

## 2015-03-02 NOTE — Progress Notes (Signed)
Greig Castilla to be D/C'd Home per MD order.  Discussed with the patient and all questions fully answered.  VSS, Skin clean, dry and intact without evidence of skin break down, no evidence of skin tears noted. IV catheter discontinued intact. Site without signs and symptoms of complications. Dressing and pressure applied.  An After Visit Summary was printed and given to the patient. Patient received prescription.  D/c education completed with patient/family including follow up instructions, medication list, d/c activities limitations if indicated, with other d/c instructions as indicated by MD - patient able to verbalize understanding, all questions fully answered. Left message at dr Burr Medico office to call daugther with phone number as office closed at [phone]. Patient instructed to return to ED, call 911, or call MD for any changes in condition.   Patient escorted via [place], and D/C home via private auto.   Pecolia Ades [facility] 03/02/2015 4:52 PM

## 2015-03-02 NOTE — Discharge Summary (Signed)
Physician Discharge Summary  Sarah Harmon [id number] DOB: 26-Dec-1945 DOA: 02/26/2015  PCP: Cathlean Cower, MD  Admit date: 02/26/2015 Discharge date: 03/02/2015  Time spent: 40 minutes  Recommendations for Outpatient Follow-up:  Severe hyponatremia/SIADH/[place] -Hepatitis panel negative -HIV Panel negative -Abdominal US;liver cirrhosis + liver and left kidney mass see results below -Continue Pt on fluid restriction 1000 ml/dy -TSH within normal limits   Alcoholic liver cirrhosis  -Patient and family counseled on need for abstinence -Lactulose 20 mg BID  Liver mass -MRI; hepatocellular cancer by MRI findings. -Patient and Family aware that if either mass appears malignant on MRI will change plan of care.  Hepatocellular carcinoma -Patient will be seen as outpatient by Dr Truitt Merle, one of our GI oncology specialists  Left kidney mass -MRI   Alcoholic -CIWA protocol -Echocardiogram; rule out alcoholic cardiomyopathy pending - Depression  -Home dose Celexa 20 mg daily, continue reduced dose Celexa 10 mg daily as this can exacerbate SIADH  -PCP to titrate patient off this medication to help with SIADH control  HTN Urgency - Continue amlodipine 5 mg daily  -Continue Catapres 0.2 mg BID -Continue hydralazine 10 mg PRN -Continue propranolol 20 mg BID    Discharge Diagnoses:  Principal Problem:   Hyponatremia Active Problems:   Depression   Hypertensive urgency   Alcoholic liver failure   SIADH (syndrome of inappropriate ADH production)   Alcoholic cirrhosis of liver without ascites   Liver mass   Left kidney mass   Liver mass, right lobe   Neoplasm /cancer   Hepatocellular carcinoma   Discharge Condition: Stable  Diet recommendation:  Heart healthy;fluid restriction 1000 ml/dy  Filed Weights   02/28/15 0343 03/01/15 0500 03/02/15 0409  Weight: 57.9 kg (127 lb 10.3 oz) 57.8 kg (127 lb 6.8 oz) 57.7 kg (127 lb 3.3 oz)    History of present illness:   Sarah Harmon is a 69 y.o. 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 Patient states that she does not have any symptoms. Denies any chest pain dizziness loss of consciousness shortness of breath focal deficits nausea vomiting or diarrhea. In the ER repeat metabolic panel shows sodium of 120. Patient admits to taking a lot of fluids. And states she still drinks alcohol but only once or twice a week. Patient is on spironolactone and Celexa. In addition patient's blood pressure is found to be more than [id number] systolic. Patient will be admitted for further management of her hyponatremia and uncontrolled blood pressure. During patient's hospitalization she was found to have SIADH ([place]ia) secondary to alcoholism, and a new diagnosis of hepatocellular carcinoma.    Consultants: Osvaldo Shipper Magrinat (oncology)   Procedure/Significant Events: 3/13 ultrasound abdomen;Cirrhotic appearing liver. - mass in RIGHT lobe 2.0 cm liver? -Cholelithiasis without evidence acute cholecystitis. -Complicated cystic lesion at upper pole LEFT kidney 2.6 cm  3/13 MRI liver with and without contrast; -1.5 cm hyperenhancing T2 bright lesion w/i Rt hepatic lobe. Imaging findings in the setting of cirrhosis are diagnostic of hepatocellular carcinoma. -There is an indeterminate 10 mm T2 bright lesion within the hepatic dome -Cholelithiasis.   Discharge Exam: Filed Vitals:   03/02/15 0305 03/02/15 0409 03/02/15 0733 03/02/15 1130  BP: 138/56  154/68 131/67  Pulse: 55  56 58  Temp: 98.5 F (36.9 C)  98.3 F (  36.8 C) 98.2 F (36.8 C)  TempSrc: Oral  Oral Oral  Resp: 19  16 15   Height:      Weight:  57.7 kg (127 lb 3.3 oz)    SpO2: 100%  100% 99%    General: A/O 4, NAD, No acute  respiratory distress Lungs: Clear to auscultation bilaterally without wheezes or crackles Cardiovascular: Regular rate and rhythm without murmur gallop or rub normal S1 and S2 Abdomen: Nontender, nondistended, soft, bowel sounds positive, no rebound, no ascites, no appreciable mass, negative CVA tenderness Extremities: No significant cyanosis, clubbing, or edema bilateral lower extremities   Discharge Instructions     Medication List    STOP taking these medications        spironolactone 50 MG tablet  Commonly known as:  ALDACTONE     triamcinolone cream 0.1 %  Commonly known as:  KENALOG      TAKE these medications        alendronate 70 MG tablet  Commonly known as:  FOSAMAX  Take 1 tablet (70 mg total) by mouth every 7 (seven) days. Take with a full glass of water on an empty stomach.     ALPRAZolam 0.5 MG tablet  Commonly known as:  XANAX  TAKE ONE TABLET BY MOUTH AT BEDTIME AS NEEDED     amLODipine 5 MG tablet  Commonly known as:  NORVASC  Take 2 tablets (10 mg total) by mouth daily.     CALTRATE 600+D 600-400 MG-UNIT per tablet  Generic drug:  Calcium Carbonate-Vitamin D  Take 1 tablet by mouth daily.     citalopram 20 MG tablet  Commonly known as:  CELEXA  Take 0.5 tablets (10 mg total) by mouth daily.     cloNIDine 0.2 MG tablet  Commonly known as:  CATAPRES  Take 1 tablet (0.2 mg total) by mouth 3 (three) times daily.     folic acid 1 MG tablet  Commonly known as:  FOLVITE  TAKE ONE TABLET BY MOUTH ONCE DAILY     ketotifen 0.025 % ophthalmic solution  Commonly known as:  ZADITOR  Place 1 drop into both eyes 2 (two) times daily.     lactulose 10 GM/15ML solution  Commonly known as:  CHRONULAC  Take 30 mLs (20 g total) by mouth 2 (two) times daily.     multivitamin tablet  Take 1 tablet by mouth daily.     propranolol 20 MG tablet  Commonly known as:  INDERAL  TAKE ONE TABLET BY MOUTH TWICE DAILY     SYSTANE OP  Place 1 drop into both eyes  daily.     thiamine 100 MG tablet  Commonly known as:  VITAMIN B-1  Take 1 tablet (100 mg total) by mouth daily.     Vitamin D3 1000 UNITS Caps  Take 1 capsule by mouth daily.       No Known Allergies Follow-up Information    Follow up with Cathlean Cower, MD. Schedule an appointment as soon as possible for a visit in 1 week.   Specialties:  Internal Medicine, Radiology   Why:  Hospital follow-up; alcoholism([place]ia), SIADH, please titrate patient off Celexa, new diagnosis hepatocellular carcinoma   Contact information:   [place] [address] [phone]       Follow up with Truitt Merle, MD. Schedule an appointment as soon as possible for a visit in 1 week.   Specialties:  Hematology, Oncology   Why:  Hepatocellular carcinoma  Contact information:   [place] [id number]        The results of significant diagnostics from this hospitalization (including imaging, microbiology, ancillary and laboratory) are listed below for reference.    Significant Diagnostic Studies: US Abdomen Complete  02/28/2015   CLINICAL DATA:  Alcoholic liver failure, alcohol abuse, hypertension, Laennec's cirrhosis, history pancreatitis, cirrhosis  EXAM: ULTRASOUND ABDOMEN COMPLETE  COMPARISON:  06/06/2010  FINDINGS: Gallbladder: Multiple shadowing calculi in gallbladder up to 1.8 cm diameter. Small amount of gallbladder sludge. No gallbladder wall thickening, pericholecystic fluid or sonographic Murphy sign.  Common bile duct: Diameter: 5 mm diameter normal  Liver: Heterogeneous with slightly nodular margins consistent with alcoholic cirrhosis. Heterogeneous area of hypo echogenicity in RIGHT lobe 1.7 x 2.0 x 2.0 cm unable to exclude mass/ tumor.  IVC: Normal appearance  Pancreas: Tail suboptimally visualized due to bowel gas, remaining portions normal appearance  Spleen: Normal appearance, 4.6 cm length  Right Kidney: Length: 9.5 cm. Normal cortical  thickness. Minimally increased cortical echogenicity. No mass or hydronephrosis.  Left Kidney: Length: 10.4 cm. Normal cortical thickness. Increased cortical echogenicity. Complicated cyst at upper pole 2.6 x 2.0 x 2.3 cm. No hydronephrosis.  Abdominal aorta: Normal caliber with atherosclerotic calcifications.  Other findings: No ascites  IMPRESSION: Cirrhotic appearing liver with question mass in RIGHT lobe 2.0 cm greatest size; followup MR imaging recommended to assess.  Cholelithiasis without evidence acute cholecystitis.  Complicated cystic lesion at upper pole LEFT kidney 2.6 cm greatest size; this can be assessed at same time an as assessment of liver lesion.  Question medical renal disease changes, recommend correlation with renal function testing.   Electronically Signed   By: Lavonia Dana M.D.   On: 02/28/2015 09:09   Mr Liver W Wo Contrast  03/01/2015   CLINICAL DATA:  Patient with history of hepatic cirrhosis secondary to alcoholism.  EXAM: MRI ABDOMEN WITHOUT AND WITH CONTRAST  TECHNIQUE: Multiplanar multisequence MR imaging of the abdomen was performed both before and after the administration of intravenous contrast.  CONTRAST:  5 mm Eovist  COMPARISON:  Ultrasound 02/28/2015  FINDINGS: Lower chest:  No definite pleural effusion.  Normal heart size.  Hepatobiliary: Within the peripheral right hepatic lobe there is a 1.5 cm intermediate T2 bright hyperenhancing lesion with rapid washout (image 25 ; series 1301) and peripheral rim enhancement.  Within the hepatic dome (image 4; series 16) (image 3; series 19) there is a 10 mm T2 bright lesion without definitive enhancement demonstrated on postcontrast images. The lesion is able to be visualized on the 20 minutes delayed sequence (image 16; series 21).  The liver is nodular in contour and cirrhotic in morphology. The left hepatic lobe is heterogeneous over the post contrast enhanced phases. There is a large gallstone demonstrated within the gallbladder  lumen. No gallbladder wall thickening. No intrahepatic or extrahepatic biliary ductal dilatation.  Pancreas: Unremarkable  Spleen: Unremarkable  Adrenals/Urinary Tract: Normal bilateral adrenal glands. Kidneys enhance symmetrically with contrast. 3.8 x 2.2 cm multiloculated cyst within the superior pole of the left kidney, grossly stable dating back to 2008. No hydronephrosis.  Stomach/Bowel: Visualized small large bowel is unremarkable.  Vascular/Lymphatic: Visualized abdominal aorta is normal in caliber. No retroperitoneal lymphadenopathy.  Other: None  Musculoskeletal: Normal osseous marrow signal.  IMPRESSION: There is a 1.5 cm hyperenhancing T2 bright lesion within the right hepatic lobe which demonstrates washout. Imaging findings in the setting of cirrhosis are diagnostic of hepatocellular carcinoma. In not already  obtained, multidisciplinary (surgical, oncological and interventional radiology) consultation is recommended.  There is an indeterminate 10 mm T2 bright lesion within the hepatic dome which is confirmed on the 20 minutes post delayed images. No definite arterial phase enhancement is able to be demonstrated however evaluation of the hepatic dome is limited secondary to motion artifact.  Cholelithiasis.   Electronically Signed   By: Lovey Newcomer M.D.   On: 03/01/2015 10:20   Dg Chest Port 1 View  02/27/2015   CLINICAL DATA:  Hypernatremia, shortness of Breath  EXAM: PORTABLE CHEST - 1 VIEW  COMPARISON:  06/04/2010  FINDINGS: Lungs clear. Heart size upper limits normal. Atheromatous aorta. No pneumothorax. No effusion. Visualized skeletal structures are unremarkable.  IMPRESSION: No acute cardiopulmonary disease.   Electronically Signed   By: Lucrezia Europe M.D.   On: 02/27/2015 10:46    Microbiology: Recent Results (from the past 240 hour(s))  MRSA PCR Screening     Status: None   Collection Time: 02/26/15 11:15 PM  Result Value Ref Range Status   MRSA by PCR NEGATIVE NEGATIVE Final     Comment:        The GeneXpert MRSA Assay (FDA approved for NASAL specimens only), is one component of a comprehensive MRSA colonization surveillance program. It is not intended to diagnose MRSA infection nor to guide or monitor treatment for MRSA infections.      Labs: Basic Metabolic Panel:  Recent Labs Lab 02/27/15 0707 02/27/15 1144 02/28/15 0335 03/01/15 0352 03/02/15 0040  NA 127* 126* 124* 128* 125*  K 4.6 4.9 4.4 4.3 4.3  CL 94* 92* 91* 94* 94*  CO2 25 25 26 29 23   GLUCOSE 101* 104* 105* 112* 111*  BUN 13 14 14 13 13   CREATININE 1.30* 1.38* 1.37* 1.40* 1.56*  CALCIUM 9.3 9.4 9.3 9.7 9.1  PHOS  --   --  4.0  --   --    Liver Function Tests:  Recent Labs Lab 02/26/15 1043 02/28/15 0335 03/01/15 0352  AST 35 36 31  ALT 24 20 20   ALKPHOS 54 39 39  BILITOT 0.5 0.8 0.8  PROT 8.2 6.9 7.1  ALBUMIN 3.7 2.7* 2.9*    Recent Labs Lab 02/27/15 1550  LIPASE 27    Recent Labs Lab 02/28/15 0852 03/01/15 0352  AMMONIA 55* 19   CBC:  Recent Labs Lab 02/26/15 1043 02/26/15 1556 02/26/15 1610 02/27/15 0043 02/28/15 0335 03/01/15 0352 03/02/15 0040  WBC 3.4* 3.4*  --  6.1 2.8* 3.3* 2.9*  NEUTROABS 1.8 1.6*  --   --  1.3* 1.7  --   HGB 13.9 13.5 15.6* 14.5 12.1 11.9* 11.3*  HCT 40.4 37.5 46.0 39.4 34.1* 34.7* 33.3*  MCV 93.7 89.1  --  88.9 92.9 93.0 92.8  PLT 156.0 155  --  148* 120* 114* 130*   Cardiac Enzymes: No results for input(s): CKTOTAL, CKMB, CKMBINDEX, TROPONINI in the last 168 hours. BNP: BNP (last 3 results) No results for input(s): BNP in the last 8760 hours.  ProBNP (last 3 results) No results for input(s): PROBNP in the last 8760 hours.  CBG: No results for input(s): GLUCAP in the last 168 hours.     Signed:  Dia Crawford, MD [facility] [phone] pager

## 2015-03-03 ENCOUNTER — Telehealth: Payer: Self-pay | Admitting: *Deleted

## 2015-03-03 ENCOUNTER — Telehealth: Payer: Self-pay | Admitting: Hematology

## 2015-03-03 NOTE — Telephone Encounter (Signed)
S/W PT IN REF TO NP APPT. ON 03/11/15@12 :30 DR CURTIS WOODS DX-HEPATOCELLULAR CARCINOMA RECORDS IN EPIC

## 2015-03-03 NOTE — Telephone Encounter (Signed)
Transition Care Management Follow-up Telephone Call D/C 03/02/15  How have you been since you were released from the hospital? Pt states she is feeling ok little weak   Do you understand why you were in the hospital? YES   Do you understand the discharge instrcutions? YES  Items Reviewed:  Medications reviewed: YES  Allergies reviewed: YE  Dietary changes reviewed: YES, heart healthy, fluid restriction 1000 ml daily       Referrals reviewed: No referrals needed  Functional Questionnaire:   Activities of Daily Living (ADLs):   She states she are independent in the following: ambulation, bathing and hygiene, feeding, continence, grooming, toileting and dressing She states she can do for herself, but her daughter Sarah Harmon) helps her out alot   Any transportation issues/concerns?: NO  Any patient concerns? NO   Confirmed importance and date/time of follow-up visits scheduled: YES, called daughter Sarah Harmon made appt for 03/09/15 w/Dr. Jenny Reichmann   Confirmed with patient if condition begins to worsen call PCP or go to the ER.

## 2015-03-03 NOTE — Telephone Encounter (Signed)
PT AWARE OF NEW APPT ON 03/10/15@1 :30

## 2015-03-04 ENCOUNTER — Emergency Department (HOSPITAL_COMMUNITY): Payer: Medicare Other

## 2015-03-04 ENCOUNTER — Encounter (HOSPITAL_COMMUNITY): Payer: Self-pay | Admitting: Emergency Medicine

## 2015-03-04 ENCOUNTER — Inpatient Hospital Stay (HOSPITAL_COMMUNITY)
Admission: EM | Admit: 2015-03-04 | Discharge: 2015-03-07 | DRG: 312 | Disposition: A | Discharge status: Home / Self Care | Payer: Medicare Other | Attending: Internal Medicine | Admitting: Internal Medicine

## 2015-03-04 DIAGNOSIS — N183 Chronic kidney disease, stage 3 unspecified: Secondary | ICD-10-CM | POA: Diagnosis present

## 2015-03-04 DIAGNOSIS — R4182 Altered mental status, unspecified: Secondary | ICD-10-CM

## 2015-03-04 DIAGNOSIS — D696 Thrombocytopenia, unspecified: Secondary | ICD-10-CM | POA: Diagnosis not present

## 2015-03-04 DIAGNOSIS — R55 Syncope and collapse: Secondary | ICD-10-CM | POA: Diagnosis not present

## 2015-03-04 DIAGNOSIS — E871 Hypo-osmolality and hyponatremia: Secondary | ICD-10-CM | POA: Diagnosis present

## 2015-03-04 DIAGNOSIS — F1020 Alcohol dependence, uncomplicated: Secondary | ICD-10-CM | POA: Diagnosis present

## 2015-03-04 DIAGNOSIS — K7460 Unspecified cirrhosis of liver: Secondary | ICD-10-CM | POA: Diagnosis present

## 2015-03-04 DIAGNOSIS — I129 Hypertensive chronic kidney disease with stage 1 through stage 4 chronic kidney disease, or unspecified chronic kidney disease: Secondary | ICD-10-CM | POA: Diagnosis present

## 2015-03-04 DIAGNOSIS — I10 Essential (primary) hypertension: Secondary | ICD-10-CM | POA: Diagnosis not present

## 2015-03-04 DIAGNOSIS — I2699 Other pulmonary embolism without acute cor pulmonale: Secondary | ICD-10-CM

## 2015-03-04 DIAGNOSIS — R001 Bradycardia, unspecified: Secondary | ICD-10-CM

## 2015-03-04 DIAGNOSIS — E785 Hyperlipidemia, unspecified: Secondary | ICD-10-CM | POA: Diagnosis present

## 2015-03-04 DIAGNOSIS — N179 Acute kidney failure, unspecified: Secondary | ICD-10-CM | POA: Diagnosis present

## 2015-03-04 DIAGNOSIS — D759 Disease of blood and blood-forming organs, unspecified: Secondary | ICD-10-CM | POA: Diagnosis present

## 2015-03-04 DIAGNOSIS — N2889 Other specified disorders of kidney and ureter: Secondary | ICD-10-CM | POA: Diagnosis present

## 2015-03-04 DIAGNOSIS — C220 Liver cell carcinoma: Secondary | ICD-10-CM | POA: Diagnosis present

## 2015-03-04 DIAGNOSIS — F419 Anxiety disorder, unspecified: Secondary | ICD-10-CM | POA: Diagnosis present

## 2015-03-04 DIAGNOSIS — D72819 Decreased white blood cell count, unspecified: Secondary | ICD-10-CM | POA: Diagnosis present

## 2015-03-04 DIAGNOSIS — M1990 Unspecified osteoarthritis, unspecified site: Secondary | ICD-10-CM | POA: Diagnosis present

## 2015-03-04 DIAGNOSIS — K7030 Alcoholic cirrhosis of liver without ascites: Secondary | ICD-10-CM | POA: Diagnosis not present

## 2015-03-04 DIAGNOSIS — I160 Hypertensive urgency: Secondary | ICD-10-CM

## 2015-03-04 DIAGNOSIS — F329 Major depressive disorder, single episode, unspecified: Secondary | ICD-10-CM | POA: Diagnosis present

## 2015-03-04 DIAGNOSIS — R030 Elevated blood-pressure reading, without diagnosis of hypertension: Secondary | ICD-10-CM | POA: Diagnosis not present

## 2015-03-04 LAB — CBC WITH DIFFERENTIAL/PLATELET
Basophils Absolute: 0 10*3/uL (ref 0.0–0.1)
Basophils Relative: 1 % (ref 0–1)
EOS ABS: 0.2 10*3/uL (ref 0.0–0.7)
EOS PCT: 7 % — AB (ref 0–5)
HEMATOCRIT: 37.9 % (ref 36.0–46.0)
HEMOGLOBIN: 13.1 g/dL (ref 12.0–15.0)
LYMPHS PCT: 25 % (ref 12–46)
Lymphs Abs: 0.9 10*3/uL (ref 0.7–4.0)
MCH: 32.4 pg (ref 26.0–34.0)
MCHC: 34.6 g/dL (ref 30.0–36.0)
MCV: 93.8 fL (ref 78.0–100.0)
Monocytes Absolute: 0.6 10*3/uL (ref 0.1–1.0)
Monocytes Relative: 18 % — ABNORMAL HIGH (ref 3–12)
Neutro Abs: 1.7 10*3/uL (ref 1.7–7.7)
Neutrophils Relative %: 49 % (ref 43–77)
PLATELETS: 127 10*3/uL — AB (ref 150–400)
RBC: 4.04 MIL/uL (ref 3.87–5.11)
RDW: 13.1 % (ref 11.5–15.5)
WBC: 3.4 10*3/uL — AB (ref 4.0–10.5)

## 2015-03-04 LAB — AFP TUMOR MARKER: AFP-Tumor Marker: 8.4 ng/mL — ABNORMAL HIGH (ref 0.0–8.3)

## 2015-03-04 LAB — COMPREHENSIVE METABOLIC PANEL
ALBUMIN: 3.4 g/dL — AB (ref 3.5–5.2)
ALK PHOS: 45 U/L (ref 39–117)
ALT: 26 U/L (ref 0–35)
AST: 43 U/L — ABNORMAL HIGH (ref 0–37)
Anion gap: 8 (ref 5–15)
BUN: 10 mg/dL (ref 6–23)
CHLORIDE: 95 mmol/L — AB (ref 96–112)
CO2: 28 mmol/L (ref 19–32)
CREATININE: 1.93 mg/dL — AB (ref 0.50–1.10)
Calcium: 10.6 mg/dL — ABNORMAL HIGH (ref 8.4–10.5)
GFR calc Af Amer: 29 mL/min — ABNORMAL LOW (ref >90)
GFR calc non Af Amer: 25 mL/min — ABNORMAL LOW (ref >90)
Glucose, Bld: 89 mg/dL (ref 70–99)
POTASSIUM: 4.3 mmol/L (ref 3.5–5.1)
Sodium: 131 mmol/L — ABNORMAL LOW (ref 135–145)
Total Bilirubin: 0.4 mg/dL (ref 0.3–1.2)
Total Protein: 8.2 g/dL (ref 6.0–8.3)

## 2015-03-04 LAB — URINALYSIS, ROUTINE W REFLEX MICROSCOPIC
Bilirubin Urine: NEGATIVE
GLUCOSE, UA: NEGATIVE mg/dL
KETONES UR: NEGATIVE mg/dL
NITRITE: NEGATIVE
PH: 6.5 (ref 5.0–8.0)
Protein, ur: 100 mg/dL — AB
Specific Gravity, Urine: 1.015 (ref 1.005–1.030)
Urobilinogen, UA: 0.2 mg/dL (ref 0.0–1.0)

## 2015-03-04 LAB — AMMONIA: AMMONIA: 34 umol/L — AB (ref 11–32)

## 2015-03-04 LAB — URINE MICROSCOPIC-ADD ON

## 2015-03-04 MED ORDER — HYDRALAZINE HCL 20 MG/ML IJ SOLN
10.0000 mg | Freq: Once | INTRAMUSCULAR | Status: AC
  Administered 2015-03-04: 5 mg via INTRAVENOUS
  Filled 2015-03-04: qty 1

## 2015-03-04 MED ORDER — HYDRALAZINE HCL 20 MG/ML IJ SOLN: 10.0000 mg | INTRAMUSCULAR | Status: DC

## 2015-03-04 MED ORDER — HYDRALAZINE HCL 25 MG PO TABS
25.0000 mg | ORAL_TABLET | Freq: Three times a day (TID) | ORAL | Status: DC
  Administered 2015-03-05 (×3): 25 mg via ORAL
  Filled 2015-03-04 (×6): qty 1

## 2015-03-04 MED ORDER — NICARDIPINE HCL IN NACL 20-0.86 MG/200ML-% IV SOLN
5.0000 mg/h | Freq: Once | INTRAVENOUS | Status: AC
  Administered 2015-03-04: 5 mg/h via INTRAVENOUS
  Filled 2015-03-04: qty 200

## 2015-03-04 NOTE — H&P (Signed)
[facility] History and Physical  Sarah Harmon [id number] DOB: 13-Mar-1946 DOA: 03/04/2015  Referring physician: ER physician. PCP: Cathlean Cower, MD   Chief Complaint: Loss of consciousness.  History obtained from ER physician and patient's daughter and patient.  HPI: Sarah Harmon is a 69 y.o. female who was recently admitted for hyponatremia and was found to have a liver mass and kidney mass. Liver mass was concerning for hepatocellular carcinoma and has been referred to outpatient gastroenterologist oncologist for further workup. Patient was found to have sudden onset of loss of consciousness while talking to her daughter this evening. Patient daughter states that patient suddenly lost consciousness while talking in a seated position. Patient may have loss consciousness for around 10 minutes. Patient had brief episodes of jerking spells and eye rolling back. Patient did not have any incontinence of urine or tongue bite. Patient regained consciousness spontaneously and does not recall the incident. In the ER patient was found to be having blood pressure more than A999333 systolic with sinus bradycardia. Patient denies any chest pain shortness of breath visual symptoms or any weakness of upper or lower extremities. Had brief episodes of headache. CT head did not show anything acute. Patient has history of alcoholism but last drink was prior to last admission.   Review of Systems: As presented in the history of presenting illness, rest negative.  Past Medical History  Diagnosis Date  . ALCOHOL ABUSE 07/13/2010  . ANXIETY 12/22/2009  . DEPRESSION 12/22/2009  . 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 FATIGUE 12/23/2010  . GALLSTONES 12/22/2009  . HYPERLIPIDEMIA 12/22/2009  . HYPERTENSION 12/22/2009  . HYPONATREMIA 12/22/2009  . 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 OSTEOPENIA 12/22/2009  . Pancytopenia 07/13/2010  . RENAL CYST, LEFT 12/22/2009  . VITAMIN D DEFICIENCY 12/23/2010  . WEIGHT LOSS  12/23/2010  . DJD (degenerative joint disease), cervical   . Renal cyst   . Gallstones    History reviewed. No pertinent past surgical history. Social History:  reports that she has never smoked. She does not have any smokeless tobacco history on file. She reports that she drinks alcohol. She reports that she does not use illicit drugs. Where does patient live home. Can patient participate in ADLs? Yes.  No Known Allergies  Family History:  Family History  Problem Relation Age of Onset  . Breast cancer Sister       Prior to Admission medications   Medication Sig Start Date End Date Taking? Authorizing Provider  acetaminophen (TYLENOL) 500 MG tablet Take 1,000 mg by mouth every 6 (six) hours as needed for moderate pain.   Yes Historical Provider, MD  alendronate (FOSAMAX) 70 MG tablet Take 1 tablet (70 mg total) by mouth every 7 (seven) days. Take with a full glass of water on an empty stomach. Patient taking differently: Take 70 mg by mouth every 7 (seven) days. Take with a full glass of water on an empty stomach. Take every Wednesday 02/26/15  Yes Biagio Borg, MD  ALPRAZolam Duanne Moron) 0.5 MG tablet TAKE ONE TABLET BY MOUTH AT BEDTIME AS NEEDED Patient taking differently: Take 0.5 mg by mouth at bedtime.  02/26/15  Yes Biagio Borg, MD  amLODipine (NORVASC) 5 MG tablet Take 2 tablets (10 mg total) by mouth daily. 03/02/15 03/01/16 Yes Allie Bossier, MD  Calcium Carbonate-Vitamin D (CALTRATE 600+D) 600-400 MG-UNIT per tablet Take 1 tablet by mouth daily.     Yes Historical Provider, MD  Cholecalciferol (VITAMIN D3) 1000 UNITS  CAPS Take 1 capsule by mouth daily.    Yes Historical Provider, MD  citalopram (CELEXA) 20 MG tablet Take 0.5 tablets (10 mg total) by mouth daily. 03/02/15  Yes Allie Bossier, MD  cloNIDine (CATAPRES) 0.2 MG tablet Take 1 tablet (0.2 mg total) by mouth 3 (three) times daily. 03/02/15  Yes Allie Bossier, MD  folic acid (FOLVITE) 1 MG tablet TAKE ONE TABLET BY MOUTH ONCE  DAILY 03/11/14  Yes Biagio Borg, MD  ketotifen (ZADITOR) 0.025 % ophthalmic solution Place 1 drop into both eyes 2 (two) times daily.   Yes Historical Provider, MD  lactulose (CHRONULAC) 10 GM/15ML solution Take 30 mLs (20 g total) by mouth 2 (two) times daily. 03/02/15  Yes Allie Bossier, MD  Multiple Vitamin (MULTIVITAMIN) tablet Take 1 tablet by mouth daily.     Yes Historical Provider, MD  Polyethyl Glycol-Propyl Glycol (SYSTANE OP) Place 1 drop into both eyes daily.   Yes Historical Provider, MD  propranolol (INDERAL) 20 MG tablet TAKE ONE TABLET BY MOUTH TWICE DAILY 09/22/14  Yes Biagio Borg, MD  thiamine (VITAMIN B-1) 100 MG tablet Take 1 tablet (100 mg total) by mouth daily. 12/27/12  Yes Biagio Borg, MD    Physical Exam: Filed Vitals:   03/04/15 2310 03/04/15 2313 03/04/15 2315 03/04/15 2330  BP: 188/68 159/92 165/67 171/72  Pulse: 54  52 50  Temp:      TempSrc:      Resp: 16  15 21   SpO2: 100%  99% 100%     General:  Moderately built and poorly nourished.  Eyes: Anicteric no pallor.  ENT: No discharge from the ears eyes nose or mouth.  Neck: No mass felt. No neck rigidity.  Cardiovascular: S1-S2 heard.  Respiratory: No rhonchi or crepitations.  Abdomen: Soft nontender bowel sounds present.  Skin: No rash.  Musculoskeletal: No edema.  Psychiatric: Appears normal.  Neurologic: Alert awake oriented to time place and person. Moves all extremities.  Labs on Admission:  Basic Metabolic Panel:  Recent Labs Lab 02/27/15 1144 02/28/15 0335 03/01/15 0352 03/02/15 0040 03/04/15 1952  NA 126* 124* 128* 125* 131*  K 4.9 4.4 4.3 4.3 4.3  CL 92* 91* 94* 94* 95*  CO2 25 26 29 23 28   GLUCOSE 104* 105* 112* 111* 89  BUN 14 14 13 13 10   CREATININE 1.38* 1.37* 1.40* 1.56* 1.93*  CALCIUM 9.4 9.3 9.7 9.1 10.6*  PHOS  --  4.0  --   --   --    Liver Function Tests:  Recent Labs Lab 02/26/15 1043 02/28/15 0335 03/01/15 0352 03/04/15 1952  AST 35 36 31 43*  ALT  24 20 20 26   ALKPHOS 54 39 39 45  BILITOT 0.5 0.8 0.8 0.4  PROT 8.2 6.9 7.1 8.2  ALBUMIN 3.7 2.7* 2.9* 3.4*    Recent Labs Lab 02/27/15 1550  LIPASE 27    Recent Labs Lab 02/28/15 0852 03/01/15 0352 03/04/15 1952  AMMONIA 55* 19 34*   CBC:  Recent Labs Lab 02/26/15 1043 02/26/15 1556  02/27/15 0043 02/28/15 0335 03/01/15 0352 03/02/15 0040 03/04/15 1952  WBC 3.4* 3.4*  --  6.1 2.8* 3.3* 2.9* 3.4*  NEUTROABS 1.8 1.6*  --   --  1.3* 1.7  --  1.7  HGB 13.9 13.5  < > 14.5 12.1 11.9* 11.3* 13.1  HCT 40.4 37.5  < > 39.4 34.1* 34.7* 33.3* 37.9  MCV 93.7 89.1  --  88.9 92.9  93.0 92.8 93.8  PLT 156.0 155  --  148* 120* 114* 130* 127*  < > = values in this interval not displayed. Cardiac Enzymes: No results for input(s): CKTOTAL, CKMB, CKMBINDEX, TROPONINI in the last 168 hours.  BNP (last 3 results) No results for input(s): BNP in the last 8760 hours.  ProBNP (last 3 results) No results for input(s): PROBNP in the last 8760 hours.  CBG: No results for input(s): GLUCAP in the last 168 hours.  Radiological Exams on Admission: Ct Head Wo Contrast  03/04/2015   CLINICAL DATA:  Syncopal episode, no fall. Found sitting up in a chair unconscious.  EXAM: CT HEAD WITHOUT CONTRAST  TECHNIQUE: Contiguous axial images were obtained from the base of the skull through the vertex without intravenous contrast.  COMPARISON:  06/04/2010  FINDINGS: There is no evidence of mass effect, midline shift, or extra-axial fluid collections. There is no evidence of a space-occupying lesion or intracranial hemorrhage. There is no evidence of a cortical-based area of acute infarction. There is generalized cerebral atrophy. There is periventricular white matter low attenuation likely secondary to microangiopathy.  The ventricles and sulci are appropriate for the patient's age. The basal cisterns are patent.  Visualized portions of the orbits are unremarkable. The visualized portions of the paranasal  sinuses and mastoid air cells are unremarkable. Cerebrovascular atherosclerotic calcifications are noted.  The osseous structures are unremarkable.  IMPRESSION: 1. No acute intracranial pathology. 2. Chronic microvascular disease and cerebral atrophy.   Electronically Signed   By: Kathreen Devoid   On: 03/04/2015 20:20    EKG: Independently reviewed. Sinus bradycardia.  Assessment/Plan Principal Problem:   Syncope Active Problems:   Hypertensive urgency   Cirrhosis of liver   Thrombocytopenia   Bradycardia   CKD (chronic kidney disease) stage 3, GFR 30-59 ml/min   1. Syncope - patient also had jerking spells at this time concerning for possible seizure-like activities. In addition patient also has sinus bradycardia which could also partly related to patient's syncopal episode. At this time we will hold off patient's beta blockers and clonidine because of the sinus bradycardia. I have discussed with on-call neurologist Dr. Doy Mince was advised to get MRI brain and EEG. Patient will be closely observed in telemetry. 2. Hypertensive urgency - patient states she has been compliant with her medications. But since patient is bradycardic at this time we'll hold off clonidine and propranolol. Continue amlodipine and I have placed patient on when necessary IV hydralazine for systolic blood pressure more than 180 and since I'm discontinuing patient's clonidine and propranolol I have added hydralazine 25 mg by mouth every 8 hours only. Closely follow blood pressure trends. 3. Acute on chronic kidney disease with mild hypercalcemia - closely follow intake output and metabolic panel. Hold calcium supplements. 4. Liver mass and kidney mass - liver masses concerning for possible carcinoma and patient has outpatient follow-up. 5. Liver cirrhosis - appears compensated. Patient is not on any diuretics. Continue lactulose. 6. 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 7. Recently  admitted for hyponatremia - follow metabolic panel closely. Fluid restriction.   DVT Prophylaxis Lovenox.  Code Status: Full code.  Family Communication: Patient's daughter at the bedside.  Disposition Plan: Admit to inpatient.    Gabbi Whetstone N. [facility] Pager [phone].  If 7PM-7AM, please contact night-coverage [email or web address] Password [facility] 03/04/2015, 11:35 PM

## 2015-03-04 NOTE — ED Provider Notes (Signed)
CSN: [id number]     Arrival date & time 03/04/15  1932 History   First MD Initiated Contact with Patient 03/04/15 1934     Chief Complaint  Patient presents with  . Loss of Consciousness     (Consider location/radiation/quality/duration/timing/severity/associated sxs/prior Treatment) HPI Comments: The patient is a 69 year old female, she presents to the hospital with altered mental status which occurred prior to arrival according to the daughter who was with her at home. The medical record shows that the patient was recently admitted to the hospital for severe hyponatremia, she had been drinking excessive amounts of water, has known liver disease, was admitted for approximately 4 days during which time her sodium corrected to approximately 125 and she was discharged. Today she had approximately 15 minutes of altered mental status where she was sitting on the couch, she would not respond to family members, she would not respond to either verbal or physical stimuli, her mouth was shut tight, she would not open her eyes, she was breathing without difficulty. This stopped spontaneously, the paramedics found the patient to be at her normal baseline, this family member states that there was no seizure activity, no urinary incontinence, and no postictal period when she came out of this altered mental status. This is unusual, she does not have episodes like this in the past.  Patient is a 69 y.o. female presenting with syncope. The history is provided by the patient, a relative and medical records.  Loss of Consciousness   Past Medical History  Diagnosis Date  . ALCOHOL ABUSE 07/13/2010  . ANXIETY 12/22/2009  . DEPRESSION 12/22/2009  . 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 FATIGUE 12/23/2010  . GALLSTONES 12/22/2009  . HYPERLIPIDEMIA 12/22/2009  . HYPERTENSION 12/22/2009  . HYPONATREMIA 12/22/2009  . 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 OSTEOPENIA 12/22/2009  . Pancytopenia 07/13/2010  . RENAL CYST, LEFT  12/22/2009  . VITAMIN D DEFICIENCY 12/23/2010  . WEIGHT LOSS 12/23/2010  . DJD (degenerative joint disease), cervical   . Renal cyst   . Gallstones    History reviewed. No pertinent past surgical history. Family History  Problem Relation Age of Onset  . Breast cancer Sister    History  Substance Use Topics  . Smoking status: Never Smoker   . Smokeless tobacco: Not on file  . Alcohol Use: Yes   OB History    No data available     Review of Systems  Cardiovascular: Positive for syncope.  All other systems reviewed and are negative.     Allergies  Review of patient's allergies indicates no known allergies.  Home Medications   Prior to Admission medications   Medication Sig Start Date End Date Taking? Authorizing Provider  acetaminophen (TYLENOL) 500 MG tablet Take 1,000 mg by mouth every 6 (six) hours as needed for moderate pain.   Yes Historical Provider, MD  alendronate (FOSAMAX) 70 MG tablet Take 1 tablet (70 mg total) by mouth every 7 (seven) days. Take with a full glass of water on an empty stomach. Patient taking differently: Take 70 mg by mouth every 7 (seven) days. Take with a full glass of water on an empty stomach. Take every Wednesday 02/26/15  Yes Biagio Borg, MD  ALPRAZolam Duanne Moron) 0.5 MG tablet TAKE ONE TABLET BY MOUTH AT BEDTIME AS NEEDED Patient taking differently: Take 0.5 mg by mouth at bedtime.  02/26/15  Yes Biagio Borg, MD  amLODipine (NORVASC) 5 MG tablet Take 2 tablets (10 mg total) by mouth  daily. 03/02/15 03/01/16 Yes Allie Bossier, MD  Calcium Carbonate-Vitamin D (CALTRATE 600+D) 600-400 MG-UNIT per tablet Take 1 tablet by mouth daily.     Yes Historical Provider, MD  Cholecalciferol (VITAMIN D3) 1000 UNITS CAPS Take 1 capsule by mouth daily.    Yes Historical Provider, MD  citalopram (CELEXA) 20 MG tablet Take 0.5 tablets (10 mg total) by mouth daily. 03/02/15  Yes Allie Bossier, MD  cloNIDine (CATAPRES) 0.2 MG tablet Take 1 tablet (0.2 mg total) by mouth  3 (three) times daily. 03/02/15  Yes Allie Bossier, MD  folic acid (FOLVITE) 1 MG tablet TAKE ONE TABLET BY MOUTH ONCE DAILY 03/11/14  Yes Biagio Borg, MD  ketotifen (ZADITOR) 0.025 % ophthalmic solution Place 1 drop into both eyes 2 (two) times daily.   Yes Historical Provider, MD  lactulose (CHRONULAC) 10 GM/15ML solution Take 30 mLs (20 g total) by mouth 2 (two) times daily. 03/02/15  Yes Allie Bossier, MD  Multiple Vitamin (MULTIVITAMIN) tablet Take 1 tablet by mouth daily.     Yes Historical Provider, MD  Polyethyl Glycol-Propyl Glycol (SYSTANE OP) Place 1 drop into both eyes daily.   Yes Historical Provider, MD  propranolol (INDERAL) 20 MG tablet TAKE ONE TABLET BY MOUTH TWICE DAILY 09/22/14  Yes Biagio Borg, MD  thiamine (VITAMIN B-1) 100 MG tablet Take 1 tablet (100 mg total) by mouth daily. 12/27/12  Yes Biagio Borg, MD   BP 171/72 mmHg  Pulse 50  Temp(Src) 98.1 F (36.7 C) (Oral)  Resp 21  SpO2 100% Physical Exam  Constitutional: She appears well-developed and well-nourished. No distress.  HENT:  Head: Normocephalic and atraumatic.  Mouth/Throat: Oropharynx is clear and moist. No oropharyngeal exudate.  Eyes: Conjunctivae and EOM are normal. Pupils are equal, round, and reactive to light. Right eye exhibits no discharge. Left eye exhibits no discharge. No scleral icterus.  Neck: Normal range of motion. Neck supple. No JVD present. No thyromegaly present.  Cardiovascular: Normal rate, regular rhythm, normal heart sounds and intact distal pulses.  Exam reveals no gallop and no friction rub.   No murmur heard. Pulmonary/Chest: Effort normal and breath sounds normal. No respiratory distress. She has no wheezes. She has no rales.  Abdominal: Soft. Bowel sounds are normal. She exhibits no distension and no mass. There is no tenderness.  Musculoskeletal: Normal range of motion. She exhibits no edema or tenderness.  Lymphadenopathy:    She has no cervical adenopathy.  Neurological: She  is alert. Coordination normal.  Skin: Skin is warm and dry. No rash noted. No erythema.  Psychiatric: She has a normal mood and affect. Her behavior is normal.  Nursing note and vitals reviewed.   ED Course  Procedures (including critical care time) Labs Review Labs Reviewed  COMPREHENSIVE METABOLIC PANEL - Abnormal; Notable for the following:    Sodium 131 (*)    Chloride 95 (*)    Creatinine, Ser 1.93 (*)    Calcium 10.6 (*)    Albumin 3.4 (*)    AST 43 (*)    GFR calc non Af Amer 25 (*)    GFR calc Af Amer 29 (*)    All other components within normal limits  CBC WITH DIFFERENTIAL/PLATELET - Abnormal; Notable for the following:    WBC 3.4 (*)    Platelets 127 (*)    Monocytes Relative 18 (*)    Eosinophils Relative 7 (*)    All other components within normal limits  AMMONIA - Abnormal; Notable for the following:    Ammonia 34 (*)    All other components within normal limits  URINALYSIS, ROUTINE W REFLEX MICROSCOPIC - Abnormal; Notable for the following:    APPearance CLOUDY (*)    Hgb urine dipstick TRACE (*)    Protein, ur 100 (*)    Leukocytes, UA SMALL (*)    All other components within normal limits  URINE MICROSCOPIC-ADD ON - Abnormal; Notable for the following:    Squamous Epithelial / LPF MANY (*)    All other components within normal limits    Imaging Review Ct Head Wo Contrast  03/04/2015   CLINICAL DATA:  Syncopal episode, no fall. Found sitting up in a chair unconscious.  EXAM: CT HEAD WITHOUT CONTRAST  TECHNIQUE: Contiguous axial images were obtained from the base of the skull through the vertex without intravenous contrast.  COMPARISON:  06/04/2010  FINDINGS: There is no evidence of mass effect, midline shift, or extra-axial fluid collections. There is no evidence of a space-occupying lesion or intracranial hemorrhage. There is no evidence of a cortical-based area of acute infarction. There is generalized cerebral atrophy. There is periventricular white matter  low attenuation likely secondary to microangiopathy.  The ventricles and sulci are appropriate for the patient's age. The basal cisterns are patent.  Visualized portions of the orbits are unremarkable. The visualized portions of the paranasal sinuses and mastoid air cells are unremarkable. Cerebrovascular atherosclerotic calcifications are noted.  The osseous structures are unremarkable.  IMPRESSION: 1. No acute intracranial pathology. 2. Chronic microvascular disease and cerebral atrophy.   Electronically Signed   By: Kathreen Devoid   On: 03/04/2015 20:20     EKG Interpretation   Date/Time:  Thursday March 04 2015 20:27:11 EDT Ventricular Rate:  45 PR Interval:  218 QRS Duration: 90 QT Interval:  504 QTC Calculation: 436 R Axis:   33 Text Interpretation:  Sinus bradycardia Probable left atrial enlargement  Abnormal inferior Q waves Repol abnrm suggests ischemia, lateral leads  Since last tracing Rate slower Abnormal ekg Confirmed by Sabra Heck  MD, Hoy Fallert  [id number]) on 03/04/2015 9:07:06 PM      MDM   Final diagnoses:  Altered mental status  Hypertensive urgency  Syncope, unspecified syncope type  Acute kidney injury    The patient does not appear to be fluid overloaded, she does have a mild bradycardia and hypertension with a systolic of A999333. We'll obtain imaging, labs and EKG to rule out sources of the patient's syncope and altered mental status though at this time she appears to be baseline according to her daughter who is at the bedside. The patient denies complaint.  The patient's laboratory workup shows acute kidney injury, improving hyponatremia, however clinically she has had a persistent bradycardia and his severe hypertension as high as 219/99, she has required titratable antihypertensives, Cardene infusion was given because of severe hypertension, she had some improvement and this was able to be transitioned into intermittent hydralazine, the patient will need higher level of care,  admission to the hospital, this was discussed with the hospitalist who agrees to admit the patient to the hospital. She has had a persistent bradycardia, no signs of acute coronary event, ammonia close to normal, blood counts normal, platelets slightly low, creatinine 1.9  Meds given in ED:  Medications  hydrALAZINE (APRESOLINE) injection 10 mg (not administered)  hydrALAZINE (APRESOLINE) tablet 25 mg (not administered)  nicardipine (CARDENE) 20mg  in 0.86% saline 28ml IV infusion (0.1 mg/ml) (0 mg/hr Intravenous  Stopped 03/04/15 2257)  hydrALAZINE (APRESOLINE) injection 10 mg (5 mg Intravenous Given 03/04/15 2303)     CRITICAL CARE Performed by: Noemi Chapel D Total critical care time: 35 Critical care time was exclusive of separately billable procedures and treating other patients. Critical care was necessary to treat or prevent imminent or life-threatening deterioration. Critical care was time spent personally by me on the following activities: development of treatment plan with patient and/or surrogate as well as nursing, discussions with consultants, evaluation of patient's response to treatment, examination of patient, obtaining history from patient or surrogate, ordering and performing treatments and interventions, ordering and review of laboratory studies, ordering and review of radiographic studies, pulse oximetry and re-evaluation of patient's condition.   Noemi Chapel, MD 03/04/15 [phone]

## 2015-03-04 NOTE — ED Notes (Signed)
Cardene stopped per Dr.Kakrakandy.

## 2015-03-04 NOTE — ED Notes (Signed)
Pt arrives via EMS from home with c/o syncopal episode, a/o x4, CBG 100. No fall, found sitting up in a chair unconscious. Denies dizziness, headache. HTN 204/95. Hx of same. Compliant with meds per EMS> Here on Friday for hyponatremia, tumor to liver.

## 2015-03-04 NOTE — ED Notes (Signed)
Attempted IV access x2, unsuccessful.

## 2015-03-04 NOTE — ED Notes (Signed)
Contacted lab regarding delay on urinalysis; urine sample received, will be ran as soon as possible

## 2015-03-05 ENCOUNTER — Inpatient Hospital Stay (HOSPITAL_COMMUNITY): Payer: Medicare Other

## 2015-03-05 DIAGNOSIS — K7030 Alcoholic cirrhosis of liver without ascites: Secondary | ICD-10-CM

## 2015-03-05 DIAGNOSIS — D696 Thrombocytopenia, unspecified: Secondary | ICD-10-CM

## 2015-03-05 LAB — CBC WITH DIFFERENTIAL/PLATELET
Basophils Absolute: 0 10*3/uL (ref 0.0–0.1)
Basophils Relative: 0 % (ref 0–1)
EOS PCT: 5 % (ref 0–5)
Eosinophils Absolute: 0.2 10*3/uL (ref 0.0–0.7)
HEMATOCRIT: 35.3 % — AB (ref 36.0–46.0)
Hemoglobin: 12.2 g/dL (ref 12.0–15.0)
LYMPHS PCT: 31 % (ref 12–46)
Lymphs Abs: 1 10*3/uL (ref 0.7–4.0)
MCH: 32.3 pg (ref 26.0–34.0)
MCHC: 34.6 g/dL (ref 30.0–36.0)
MCV: 93.4 fL (ref 78.0–100.0)
MONO ABS: 0.6 10*3/uL (ref 0.1–1.0)
Monocytes Relative: 18 % — ABNORMAL HIGH (ref 3–12)
Neutro Abs: 1.4 10*3/uL — ABNORMAL LOW (ref 1.7–7.7)
Neutrophils Relative %: 46 % (ref 43–77)
PLATELETS: 146 10*3/uL — AB (ref 150–400)
RBC: 3.78 MIL/uL — ABNORMAL LOW (ref 3.87–5.11)
RDW: 12.9 % (ref 11.5–15.5)
WBC: 3.1 10*3/uL — AB (ref 4.0–10.5)

## 2015-03-05 LAB — COMPREHENSIVE METABOLIC PANEL
ALT: 21 U/L (ref 0–35)
AST: 30 U/L (ref 0–37)
Albumin: 3.1 g/dL — ABNORMAL LOW (ref 3.5–5.2)
Alkaline Phosphatase: 41 U/L (ref 39–117)
Anion gap: 8 (ref 5–15)
BUN: 10 mg/dL (ref 6–23)
CALCIUM: 11 mg/dL — AB (ref 8.4–10.5)
CO2: 30 mmol/L (ref 19–32)
Chloride: 92 mmol/L — ABNORMAL LOW (ref 96–112)
Creatinine, Ser: 1.56 mg/dL — ABNORMAL HIGH (ref 0.50–1.10)
GFR calc non Af Amer: 33 mL/min — ABNORMAL LOW (ref >90)
GFR, EST AFRICAN AMERICAN: 38 mL/min — AB (ref >90)
GLUCOSE: 96 mg/dL (ref 70–99)
Potassium: 4 mmol/L (ref 3.5–5.1)
Sodium: 130 mmol/L — ABNORMAL LOW (ref 135–145)
Total Bilirubin: 0.7 mg/dL (ref 0.3–1.2)
Total Protein: 7.9 g/dL (ref 6.0–8.3)

## 2015-03-05 LAB — TSH: TSH: 1.518 u[IU]/mL (ref 0.350–4.500)

## 2015-03-05 LAB — D-DIMER, QUANTITATIVE: D-Dimer, Quant: 1.23 ug/mL-FEU — ABNORMAL HIGH (ref 0.00–0.48)

## 2015-03-05 MED ORDER — FOLIC ACID 1 MG PO TABS
1.0000 mg | ORAL_TABLET | Freq: Every day | ORAL | Status: DC
  Filled 2015-03-05: qty 1

## 2015-03-05 MED ORDER — VITAMIN B-1 100 MG PO TABS: 100.0000 mg | ORAL_TABLET | Freq: Every day | ORAL | Status: DC

## 2015-03-05 MED ORDER — ENOXAPARIN SODIUM 60 MG/0.6ML ~~LOC~~ SOLN: 55.0000 mg | SUBCUTANEOUS | Status: DC

## 2015-03-05 MED ORDER — SODIUM CHLORIDE 0.9 % IV SOLN
INTRAVENOUS | Status: DC
  Administered 2015-03-05 – 2015-03-06 (×3): via INTRAVENOUS

## 2015-03-05 MED ORDER — LORAZEPAM 1 MG PO TABS
1.0000 mg | ORAL_TABLET | Freq: Four times a day (QID) | ORAL | Status: DC | PRN
  Administered 2015-03-06 – 2015-03-07 (×3): 1 mg via ORAL
  Filled 2015-03-05 (×3): qty 1

## 2015-03-05 MED ORDER — ALPRAZOLAM 0.5 MG PO TABS
0.5000 mg | ORAL_TABLET | Freq: Every day | ORAL | Status: DC
  Administered 2015-03-05: 0.5 mg via ORAL
  Filled 2015-03-05: qty 1

## 2015-03-05 MED ORDER — HYDRALAZINE HCL 20 MG/ML IJ SOLN
10.0000 mg | INTRAMUSCULAR | Status: DC | PRN
  Administered 2015-03-05 – 2015-03-07 (×4): 10 mg via INTRAVENOUS
  Filled 2015-03-05 (×3): qty 1

## 2015-03-05 MED ORDER — FOLIC ACID 1 MG PO TABS
1.0000 mg | ORAL_TABLET | Freq: Every day | ORAL | Status: DC
  Administered 2015-03-05 – 2015-03-07 (×3): 1 mg via ORAL
  Filled 2015-03-05 (×2): qty 1

## 2015-03-05 MED ORDER — ENOXAPARIN SODIUM 30 MG/0.3ML ~~LOC~~ SOLN
30.0000 mg | Freq: Every day | SUBCUTANEOUS | Status: DC
  Administered 2015-03-05: 30 mg via SUBCUTANEOUS
  Filled 2015-03-05: qty 0.3

## 2015-03-05 MED ORDER — ADULT MULTIVITAMIN W/MINERALS CH
1.0000 | ORAL_TABLET | Freq: Every day | ORAL | Status: DC
  Administered 2015-03-05 – 2015-03-07 (×3): 1 via ORAL
  Filled 2015-03-05 (×3): qty 1

## 2015-03-05 MED ORDER — SODIUM CHLORIDE 0.9 % IV BOLUS (SEPSIS)
250.0000 mL | Freq: Once | INTRAVENOUS | Status: AC
  Administered 2015-03-05: 250 mL via INTRAVENOUS

## 2015-03-05 MED ORDER — ENOXAPARIN SODIUM 30 MG/0.3ML ~~LOC~~ SOLN
25.0000 mg | SUBCUTANEOUS | Status: AC
  Administered 2015-03-05: 25 mg via SUBCUTANEOUS
  Filled 2015-03-05: qty 0.3

## 2015-03-05 MED ORDER — CITALOPRAM HYDROBROMIDE 10 MG PO TABS
10.0000 mg | ORAL_TABLET | Freq: Every day | ORAL | Status: DC
  Administered 2015-03-05 – 2015-03-07 (×3): 10 mg via ORAL
  Filled 2015-03-05 (×3): qty 1

## 2015-03-05 MED ORDER — THIAMINE HCL 100 MG/ML IJ SOLN: 100.0000 mg | Freq: Every day | INTRAMUSCULAR | Status: DC

## 2015-03-05 MED ORDER — HYDRALAZINE HCL 50 MG PO TABS
50.0000 mg | ORAL_TABLET | Freq: Three times a day (TID) | ORAL | Status: DC
  Administered 2015-03-05 – 2015-03-06 (×2): 50 mg via ORAL
  Filled 2015-03-05 (×2): qty 1

## 2015-03-05 MED ORDER — KETOTIFEN FUMARATE 0.025 % OP SOLN
1.0000 [drp] | Freq: Two times a day (BID) | OPHTHALMIC | Status: DC
  Administered 2015-03-05 – 2015-03-07 (×5): 1 [drp] via OPHTHALMIC
  Filled 2015-03-05: qty 5

## 2015-03-05 MED ORDER — SENNOSIDES-DOCUSATE SODIUM 8.6-50 MG PO TABS
1.0000 | ORAL_TABLET | Freq: Every evening | ORAL | Status: DC | PRN
  Filled 2015-03-05: qty 1

## 2015-03-05 MED ORDER — LORAZEPAM 2 MG/ML IJ SOLN: 1.0000 mg | Freq: Four times a day (QID) | INTRAMUSCULAR | Status: DC | PRN

## 2015-03-05 MED ORDER — VITAMIN B-1 100 MG PO TABS
100.0000 mg | ORAL_TABLET | Freq: Every day | ORAL | Status: DC
  Administered 2015-03-05 – 2015-03-07 (×3): 100 mg via ORAL
  Filled 2015-03-05 (×3): qty 1

## 2015-03-05 MED ORDER — LACTULOSE 10 GM/15ML PO SOLN
20.0000 g | Freq: Two times a day (BID) | ORAL | Status: DC
  Administered 2015-03-05 – 2015-03-07 (×5): 20 g via ORAL
  Filled 2015-03-05 (×6): qty 30

## 2015-03-05 MED ORDER — ADULT MULTIVITAMIN W/MINERALS CH: 1.0000 | ORAL_TABLET | Freq: Every day | ORAL | Status: DC

## 2015-03-05 MED ORDER — AMLODIPINE BESYLATE 10 MG PO TABS
10.0000 mg | ORAL_TABLET | Freq: Every day | ORAL | Status: DC
  Administered 2015-03-05 – 2015-03-07 (×3): 10 mg via ORAL
  Filled 2015-03-05: qty 1
  Filled 2015-03-05: qty 2
  Filled 2015-03-05: qty 1

## 2015-03-05 NOTE — ED Notes (Signed)
Called service response and ordered breakfast tray, cardiac diet.

## 2015-03-05 NOTE — Progress Notes (Signed)
[place] TEAM 1 - Stepdown/ICU TEAM Progress Note  Sarah Harmon [id number] DOB: Aug 13, 1946 DOA: 03/04/2015 PCP: Cathlean Cower, MD  Admit HPI / Brief Narrative: 69 y.o. female who was recently admitted for hyponatremia and was found to have a liver mass c/w hepatocellular carcinoma which led to referal to an outpatient GI oncologist for further workup. Patient was found to have sudden onset of loss of consciousness while talking to her daughter the evening of her readmission. Patient had a brief episodes of jerking spells and eye rolling back. Patient did not have any incontinence of urine or tongue bite. Patient regained consciousness spontaneously and did not recall the incident.   In the ER the patient was found to have a blood pressure > A999333 systolic with sinus bradycardia. Patient denied any chest pain shortness of breath visual symptoms or any weakness of upper or lower extremities. Had brief episodes of headache. CT head did not show anything acute. Patient has history of alcoholism but last drink was prior to last admission.   HPI/Subjective: Pt is alert and oriented w/ no complaints.  She is anxious to be d/c home asap.  She denies dizziness, sob, n/v, cp, or abdom pain.  She does admit to poor appetite and poor oral intake.    Assessment/Plan:  Syncope MRI head w/o acute findings - TTE 3/14 w/o signif findings - etiology not clear presently - pt is at risk for occult PE given her known CA and recent hospitalization - check d-dimer - if negative will decrease back to prophy anticoag, but likelihood high enough at this time to warrant empiric tx   Hypertensive urgency  BP now better controlled, but not yet at goal - adjust tx and follow   Hypercalcemia Ca 10.6 at admit - today 11.0 - hydrate - could be paraneoplastic syndrome due to [facility] - check PTA related peptide - not yet high enough to warrant calcitonin, but will gently hydrate and follow - keep on tele   Acute on chronic  kidney disease  Baseline crt ~1.4 - peaked at 1.93 at presentation - improving - follow w/ careful hydration   Hepatocellular Carcinoma 1.5cm lesion in R hepatic lobe - due for outpt eval next week   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 rate 20, height 5\' 3"  (1.6 m), weight 54.658 kg (120 lb 8 oz), SpO2 100 %. No intake or output data in the 24 hours ending 03/05/15 1711  Exam: General: No acute respiratory distress - alert  Lungs: Clear to auscultation bilaterally without wheezes or crackles Cardiovascular: Regular rate and rhythm without murmur gallop or rub normal S1 and S2 Abdomen: Nontender, nondistended, soft, bowel sounds positive, no rebound, no ascites, no appreciable mass Extremities: No significant cyanosis, clubbing, or edema bilateral lower extremities  Data Reviewed: Basic Metabolic Panel:  Recent Labs Lab 02/28/15 0335 03/01/15 0352 03/02/15 0040 03/04/15 1952 03/05/15 0611  NA 124* 128* 125* 131* 130*  K 4.4 4.3 4.3 4.3 4.0  CL 91* 94* 94* 95* 92*  CO2 26 29 23 28 30   GLUCOSE 105* 112* 111* 89 96  BUN 14 13 13 10 10   CREATININE 1.37* 1.40* 1.56* 1.93* 1.56*  CALCIUM 9.3 9.7 9.1 10.6* 11.0*  PHOS 4.0  --   --   --   --     Liver Function Tests:  Recent Labs Lab 02/28/15 0335 03/01/15 0352 03/04/15 1952 03/05/15 0611  AST 36 31 43* 30  ALT 20 20 26 21   ALKPHOS 39 39 45 41  BILITOT 0.8 0.8 0.4 0.7  PROT 6.9 7.1 8.2 7.9  ALBUMIN 2.7*  2.9* 3.4* 3.1*    Recent Labs Lab 02/27/15 1550  LIPASE 27    Recent Labs Lab 02/28/15 0852 03/01/15 0352 03/04/15 1952  AMMONIA 55* 19 34*    Coags:  Recent Labs Lab 02/27/15 1915 02/28/15 0335 03/01/15 0352  INR 1.14 1.04 1.11   CBC:  Recent Labs Lab 02/28/15 0335 03/01/15 0352 03/02/15 0040 03/04/15 1952 03/05/15 0611  WBC 2.8* 3.3* 2.9* 3.4* 3.1*  NEUTROABS 1.3* 1.7  --  1.7 1.4*  HGB 12.1 11.9* 11.3* 13.1 12.2  HCT 34.1* 34.7* 33.3* 37.9 35.3*  MCV 92.9 93.0 92.8 93.8 93.4  PLT 120* 114* 130* 127* 146*    Recent Results (from the past 240 hour(s))  MRSA PCR Screening     Status: None   Collection Time: 02/26/15 11:15 PM  Result Value Ref Range Status   MRSA by PCR NEGATIVE NEGATIVE Final    Comment:        The GeneXpert MRSA Assay (FDA approved for NASAL specimens only), is one component of a comprehensive MRSA colonization surveillance program. It is not intended to diagnose MRSA infection nor to guide or monitor treatment for MRSA infections.      Studies:  Recent x-ray studies have been reviewed in detail by the Attending Physician  Scheduled Meds:  Scheduled Meds: . amLODipine  10 mg Oral Daily  . citalopram  10 mg Oral Daily  . enoxaparin (LOVENOX) injection  30 mg Subcutaneous Daily  . folic acid  1 mg Oral Daily  . hydrALAZINE  25 mg Oral 3 times per day  . ketotifen  1 drop Both Eyes BID  . lactulose  20 g Oral BID  . multivitamin with minerals  1 tablet Oral Daily  . thiamine  100 mg Oral Daily    Time spent on care of this patient: 35 mins   MCCLUNG,JEFFREY T , MD   [facility] Office  [phone] Pager - Text Page per Shea Evans as per below:  On-Call/Text Page:      [email or web address]      password [email or web address]  If 7PM-7AM, please contact night-coverage [email or web address] Password [email or web address] 03/05/2015, 5:11 PM   LOS: 1 day

## 2015-03-05 NOTE — Progress Notes (Signed)
Paged and spoke with Walden Field NP about D-dimer results

## 2015-03-05 NOTE — Progress Notes (Signed)
ANTICOAGULATION CONSULT NOTE - Initial Consult  Pharmacy Consult for Lovenox Indication: r/o pulmonary embolus  No Known Allergies  Patient Measurements: Height: 5\' 3"  (160 cm) Weight: 120 lb 8 oz (54.658 kg) IBW/kg (Calculated) : 52.4  Vital Signs: Temp: 99 F (37.2 C) (03/18 1300) Temp Source: Oral (03/18 1300) BP: 181/69 mmHg (03/18 1800) Pulse Rate: 60 (03/18 1800)  Labs:  Recent Labs  03/04/15 1952 03/05/15 0611  HGB 13.1 12.2  HCT 37.9 35.3*  PLT 127* 146*  CREATININE 1.93* 1.56*    Estimated Creatinine Clearance: 27.8 mL/min (by C-G formula based on Cr of 1.56).   Medical History: Past Medical History  Diagnosis Date  . ALCOHOL ABUSE 07/13/2010  . ANXIETY 12/22/2009  . DEPRESSION 12/22/2009  . 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 FATIGUE 12/23/2010  . GALLSTONES 12/22/2009  . HYPERLIPIDEMIA 12/22/2009  . HYPERTENSION 12/22/2009  . HYPONATREMIA 12/22/2009  . 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 OSTEOPENIA 12/22/2009  . Pancytopenia 07/13/2010  . RENAL CYST, LEFT 12/22/2009  . VITAMIN D DEFICIENCY 12/23/2010  . WEIGHT LOSS 12/23/2010  . DJD (degenerative joint disease), cervical   . Renal cyst   . Gallstones     Medications:  Prescriptions prior to admission  Medication Sig Dispense Refill Last Dose  . acetaminophen (TYLENOL) 500 MG tablet Take 1,000 mg by mouth every 6 (six) hours as needed for moderate pain.   03/04/2015 at Unknown time  . alendronate (FOSAMAX) 70 MG tablet Take 1 tablet (70 mg total) by mouth every 7 (seven) days. Take with a full glass of water on an empty stomach. (Patient taking differently: Take 70 mg by mouth every 7 (seven) days. Take with a full glass of water on an empty stomach. Take every Wednesday) 12 tablet 3 03/03/2015 at Unknown time  . ALPRAZolam (XANAX) 0.5 MG tablet TAKE ONE TABLET BY MOUTH AT BEDTIME AS NEEDED (Patient taking differently: Take 0.5 mg by mouth at bedtime. ) 90 tablet 1 03/03/2015 at Unknown time  . amLODipine  (NORVASC) 5 MG tablet Take 2 tablets (10 mg total) by mouth daily. 90 tablet 0 03/04/2015 at Unknown time  . Calcium Carbonate-Vitamin D (CALTRATE 600+D) 600-400 MG-UNIT per tablet Take 1 tablet by mouth daily.     03/04/2015 at Unknown time  . Cholecalciferol (VITAMIN D3) 1000 UNITS CAPS Take 1 capsule by mouth daily.    03/04/2015 at Unknown time  . citalopram (CELEXA) 20 MG tablet Take 0.5 tablets (10 mg total) by mouth daily. 90 tablet 0 03/04/2015 at Unknown time  . cloNIDine (CATAPRES) 0.2 MG tablet Take 1 tablet (0.2 mg total) by mouth 3 (three) times daily. 90 tablet 0 03/04/2015 at Unknown time  . folic acid (FOLVITE) 1 MG tablet TAKE ONE TABLET BY MOUTH ONCE DAILY 90 tablet 3 03/04/2015 at Unknown time  . ketotifen (ZADITOR) 0.025 % ophthalmic solution Place 1 drop into both eyes 2 (two) times daily.   03/04/2015 at Unknown time  . lactulose (CHRONULAC) 10 GM/15ML solution Take 30 mLs (20 g total) by mouth 2 (two) times daily. 240 mL 0 03/04/2015 at Unknown time  . Multiple Vitamin (MULTIVITAMIN) tablet Take 1 tablet by mouth daily.     03/04/2015 at Unknown time  . Polyethyl Glycol-Propyl Glycol (SYSTANE OP) Place 1 drop into both eyes daily.   03/04/2015 at Unknown time  . propranolol (INDERAL) 20 MG tablet TAKE ONE TABLET BY MOUTH TWICE DAILY 60 tablet 6 03/04/2015 at 0800  . thiamine (VITAMIN B-1) 100  MG tablet Take 1 tablet (100 mg total) by mouth daily. 90 tablet 3 03/04/2015 at Unknown time    Assessment: 69 y/o female admitted for syncopal event. She had a recent admission for hyponatremia and was found to have a liver and kidney mass c/w hepatocellular carcinoma. Pharmacy consulted to begin Lovenox for r/o PE. She received Lovenox 30 mg SQ at 14:35. SCr is 1.56 and CrCl <30 ml/min. No bleeding noted, Hb is normal, platelets are low at 146.  Goal of Therapy:  Anti-Xa level 0.6-1 units/ml 4hrs after LMWH dose given Monitor platelets by anticoagulation protocol: Yes   Plan:  - Lovenox 25 mg  SQ now for total of 55 mg today - Lovenox 55 mg SQ q24h - begin 3/19 - CBC q72h while on Lovenox - Monitor for s/sx of bleeding  [facility], Pharm.D., BCPS Clinical Pharmacist Pager: [phone] 03/05/2015 6:07 PM

## 2015-03-05 NOTE — ED Notes (Addendum)
Per daughter, pt was sitting on the couch when she became unresponsive with teeth clenched. Denies urinary incontinence. Family reports pt would not respond to them for several minutes, then symptoms resolved. Pt A&O x 4 on arrival. Recently diagnosed with hyponatremia and currently has been limiting water intake at home.

## 2015-03-06 ENCOUNTER — Inpatient Hospital Stay (HOSPITAL_COMMUNITY): Payer: Medicare Other

## 2015-03-06 ENCOUNTER — Encounter (HOSPITAL_COMMUNITY): Payer: Self-pay | Admitting: Radiology

## 2015-03-06 DIAGNOSIS — R55 Syncope and collapse: Secondary | ICD-10-CM

## 2015-03-06 LAB — COMPREHENSIVE METABOLIC PANEL
ALBUMIN: 2.9 g/dL — AB (ref 3.5–5.2)
ALK PHOS: 42 U/L (ref 39–117)
ALT: 20 U/L (ref 0–35)
AST: 32 U/L (ref 0–37)
Anion gap: 10 (ref 5–15)
BUN: 7 mg/dL (ref 6–23)
CO2: 25 mmol/L (ref 19–32)
Calcium: 10.3 mg/dL (ref 8.4–10.5)
Chloride: 97 mmol/L (ref 96–112)
Creatinine, Ser: 1.35 mg/dL — ABNORMAL HIGH (ref 0.50–1.10)
GFR calc Af Amer: 45 mL/min — ABNORMAL LOW (ref >90)
GFR calc non Af Amer: 39 mL/min — ABNORMAL LOW (ref >90)
GLUCOSE: 93 mg/dL (ref 70–99)
Potassium: 3.5 mmol/L (ref 3.5–5.1)
Sodium: 132 mmol/L — ABNORMAL LOW (ref 135–145)
TOTAL PROTEIN: 6.8 g/dL (ref 6.0–8.3)
Total Bilirubin: 0.5 mg/dL (ref 0.3–1.2)

## 2015-03-06 LAB — CBC
HEMATOCRIT: 34.1 % — AB (ref 36.0–46.0)
Hemoglobin: 11.7 g/dL — ABNORMAL LOW (ref 12.0–15.0)
MCH: 31.7 pg (ref 26.0–34.0)
MCHC: 34.3 g/dL (ref 30.0–36.0)
MCV: 92.4 fL (ref 78.0–100.0)
Platelets: 161 10*3/uL (ref 150–400)
RBC: 3.69 MIL/uL — AB (ref 3.87–5.11)
RDW: 12.8 % (ref 11.5–15.5)
WBC: 4.1 10*3/uL (ref 4.0–10.5)

## 2015-03-06 LAB — CALCIUM, IONIZED: CALCIUM ION: 1.43 mmol/L — AB (ref 1.12–1.32)

## 2015-03-06 MED ORDER — HYDRALAZINE HCL 50 MG PO TABS: 100.0000 mg | ORAL_TABLET | Freq: Three times a day (TID) | ORAL | Status: DC

## 2015-03-06 MED ORDER — IOHEXOL 350 MG/ML SOLN
80.0000 mL | Freq: Once | INTRAVENOUS | Status: AC | PRN
  Administered 2015-03-06: 80 mL via INTRAVENOUS

## 2015-03-06 MED ORDER — ENOXAPARIN SODIUM 60 MG/0.6ML ~~LOC~~ SOLN
55.0000 mg | Freq: Two times a day (BID) | SUBCUTANEOUS | Status: DC
  Administered 2015-03-06 (×2): 55 mg via SUBCUTANEOUS
  Filled 2015-03-06 (×3): qty 0.6

## 2015-03-06 MED ORDER — PROPRANOLOL HCL 10 MG PO TABS
10.0000 mg | ORAL_TABLET | Freq: Two times a day (BID) | ORAL | Status: DC
  Administered 2015-03-06 (×2): 10 mg via ORAL
  Filled 2015-03-06 (×4): qty 1

## 2015-03-06 MED ORDER — ISOSORBIDE DINITRATE 10 MG PO TABS
5.0000 mg | ORAL_TABLET | Freq: Three times a day (TID) | ORAL | Status: DC
  Administered 2015-03-06 (×3): 5 mg via ORAL
  Filled 2015-03-06 (×3): qty 1

## 2015-03-06 MED ORDER — HYDRALAZINE HCL 50 MG PO TABS
100.0000 mg | ORAL_TABLET | Freq: Three times a day (TID) | ORAL | Status: DC
  Administered 2015-03-06 – 2015-03-07 (×4): 100 mg via ORAL
  Filled 2015-03-06 (×4): qty 2

## 2015-03-06 NOTE — Progress Notes (Signed)
TEAM 1 - Stepdown/ICU TEAM Progress Note  Sarah Harmon [id number] DOB: 03/03/1946 DOA: 03/04/2015 PCP: Cathlean Cower, MD  Admit HPI / Brief Narrative: 69 y.o. female who was recently admitted for hyponatremia and was found to have a liver mass c/w hepatocellular carcinoma which led to referal to an outpatient GI oncologist for further workup. Patient was found to have sudden onset of loss of consciousness while talking to her daughter the evening of her readmission. Patient had a brief episodes of jerking spells and eye rolling back. Patient did not have any incontinence of urine or tongue bite. Patient regained consciousness spontaneously and did not recall the incident.   In the ER the patient was found to have a blood pressure > A999333 systolic with sinus bradycardia. Patient denied any chest pain shortness of breath visual symptoms or any weakness of upper or lower extremities. Had brief episodes of headache. CT head did not show anything acute. Patient has history of alcoholism but last drink was prior to last admission.   HPI/Subjective: No new complaints over night.  Slept well.  Denies cp, n/v, sob, or leg pain.  Good appetite per her report.  D-dimer was +.  Lovenox initiated at full dose and venous duplex pending.    Assessment/Plan:  Syncope MRI head w/o acute findings - TTE 3/14 w/o signif findings - etiology not clear presently - pt is at risk for occult PE given her known CA and recent hospitalization - d-dimer was + - check venous duplex - if negative, will have to consider CTa chest   Hypertensive urgency  BP remains quite variable- adjust tx again and follow   Hypercalcemia Ca 10.6 at admit > 11.0 - has improved w/ hydration - cont to follow - could be paraneoplastic syndrome due to [facility] - checking PTA related peptide - not yet high enough to warrant calcitonin  Acute on chronic kidney disease  Baseline crt ~1.4 - peaked at 1.93 at presentation - improving -  follow w/ careful hydration   Hepatocellular Carcinoma 1.5cm lesion in R hepatic lobe - due for outpt eval next week   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 rate 21, height 5\' 3"  (1.6 m), weight 54.976 kg (121 lb 3.2 oz), SpO2 99 %.  Intake/Output Summary (Last 24 hours) at 03/06/15 1133 Last data filed at 03/06/15 1006  Gross per 24 hour  Intake    240 ml  Output      0 ml  Net    240 ml    Exam: General: No acute respiratory distress - alert and pleasant  Lungs: Clear to auscultation bilaterally without wheezes or crackles Cardiovascular: Regular rate and rhythm without gallop or rub - 2/6 holosystolic M noted  Abdomen: Nontender, nondistended, soft, bowel sounds positive, no rebound, no ascites, no appreciable mass Extremities: No significant cyanosis, clubbing, or edema bilateral lower extremities  Data Reviewed: Basic Metabolic Panel:  Recent Labs Lab 02/28/15 0335 03/01/15 0352 03/02/15 0040 03/04/15 1952 03/05/15 0611 03/06/15 0414  NA 124* 128* 125* 131* 130* 132*  K 4.4 4.3 4.3 4.3 4.0 3.5  CL 91* 94* 94* 95* 92* 97  CO2 26 29 23 28 30 25   GLUCOSE 105* 112* 111* 89 96 93  BUN 14 13 13 10 10 7   CREATININE 1.37* 1.40* 1.56* 1.93* 1.56* 1.35*  CALCIUM 9.3 9.7 9.1 10.6* 11.0* 10.3  PHOS 4.0  --   --   --   --   --     Liver Function  Tests:  Recent Labs Lab 02/28/15 0335 03/01/15 0352 03/04/15 1952 03/05/15 0611 03/06/15 0414  AST 36 31 43* 30 32  ALT 20 20 26 21 20   ALKPHOS 39 39 45 41 42  BILITOT 0.8 0.8 0.4 0.7 0.5  PROT 6.9 7.1 8.2 7.9 6.8  ALBUMIN 2.7* 2.9* 3.4* 3.1* 2.9*    Recent Labs Lab 02/27/15 1550  LIPASE 27    Recent Labs Lab 02/28/15 0852 03/01/15 0352 03/04/15 1952  AMMONIA 55* 19 34*    Coags:  Recent Labs Lab 02/27/15 1915 02/28/15 0335 03/01/15 0352  INR 1.14 1.04 1.11   CBC:  Recent Labs Lab 02/28/15 0335 03/01/15 0352 03/02/15 0040 03/04/15 1952 03/05/15 0611 03/06/15 0414  WBC 2.8* 3.3* 2.9* 3.4* 3.1* 4.1  NEUTROABS 1.3* 1.7  --  1.7 1.4*  --   HGB 12.1 11.9* 11.3* 13.1 12.2 11.7*  HCT 34.1* 34.7* 33.3* 37.9 35.3* 34.1*  MCV 92.9 93.0 92.8 93.8 93.4 92.4  PLT 120* 114* 130* 127* 146* 161    Recent Results (from the past 240 hour(s))  MRSA PCR Screening     Status: None   Collection Time: 02/26/15 11:15 PM  Result Value Ref Range Status   MRSA by PCR NEGATIVE NEGATIVE Final    Comment:        The GeneXpert MRSA Assay (FDA approved for NASAL specimens only), is one component of a comprehensive MRSA colonization surveillance program. It is not intended to diagnose MRSA infection nor to guide or monitor treatment for MRSA infections.      Studies:  Recent x-ray studies have been reviewed in detail by the Attending Physician  Scheduled Meds:  Scheduled Meds: . ALPRAZolam  0.5 mg Oral QHS  . amLODipine  10 mg Oral Daily  . citalopram  10 mg Oral Daily  . enoxaparin (LOVENOX) injection  55 mg Subcutaneous Q12H  . folic acid  1 mg Oral Daily  . hydrALAZINE  50 mg Oral 3 times per day  . ketotifen  1 drop Both Eyes BID  . lactulose  20 g Oral BID  . multivitamin with minerals  1 tablet Oral Daily  . thiamine  100 mg Oral Daily    Time spent on care of this patient: 35 mins   Jerriyah Louis T , MD   [facility] Office   [phone] Pager - Text Page per Shea Evans as per below:  On-Call/Text Page:      [email or web address]      password [email or web address]  If 7PM-7AM, please contact night-coverage [email or web address] Password [facility] 03/06/2015, 11:33 AM   LOS: 2 days

## 2015-03-06 NOTE — Progress Notes (Signed)
ANTICOAGULATION CONSULT NOTE - Initial Consult  Pharmacy Consult for Lovenox Indication: r/o pulmonary embolus  No Known Allergies  Patient Measurements: Height: 5\' 3"  (160 cm) Weight: 121 lb 3.2 oz (54.976 kg) IBW/kg (Calculated) : 52.4  Vital Signs: Temp: 98.2 F (36.8 C) (03/19 0526) Temp Source: Oral (03/19 0526) BP: 167/79 mmHg (03/19 0526) Pulse Rate: 72 (03/19 0526)  Labs:  Recent Labs  03/04/15 1952 03/05/15 0611 03/06/15 0414  HGB 13.1 12.2 11.7*  HCT 37.9 35.3* 34.1*  PLT 127* 146* 161  CREATININE 1.93* 1.56* 1.35*    Estimated Creatinine Clearance: 32.1 mL/min (by C-G formula based on Cr of 1.35).   Medical History: Past Medical History  Diagnosis Date  . ALCOHOL ABUSE 07/13/2010  . ANXIETY 12/22/2009  . DEPRESSION 12/22/2009  . 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 FATIGUE 12/23/2010  . GALLSTONES 12/22/2009  . HYPERLIPIDEMIA 12/22/2009  . HYPERTENSION 12/22/2009  . HYPONATREMIA 12/22/2009  . 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 OSTEOPENIA 12/22/2009  . Pancytopenia 07/13/2010  . RENAL CYST, LEFT 12/22/2009  . VITAMIN D DEFICIENCY 12/23/2010  . WEIGHT LOSS 12/23/2010  . DJD (degenerative joint disease), cervical   . Renal cyst   . Gallstones     Medications:  Prescriptions prior to admission  Medication Sig Dispense Refill Last Dose  . acetaminophen (TYLENOL) 500 MG tablet Take 1,000 mg by mouth every 6 (six) hours as needed for moderate pain.   03/04/2015 at Unknown time  . alendronate (FOSAMAX) 70 MG tablet Take 1 tablet (70 mg total) by mouth every 7 (seven) days. Take with a full glass of water on an empty stomach. (Patient taking differently: Take 70 mg by mouth every 7 (seven) days. Take with a full glass of water on an empty stomach. Take every Wednesday) 12 tablet 3 03/03/2015 at Unknown time  . ALPRAZolam (XANAX) 0.5 MG tablet TAKE ONE TABLET BY MOUTH AT BEDTIME AS NEEDED (Patient taking differently: Take 0.5 mg by mouth at bedtime. ) 90 tablet 1  03/03/2015 at Unknown time  . amLODipine (NORVASC) 5 MG tablet Take 2 tablets (10 mg total) by mouth daily. 90 tablet 0 03/04/2015 at Unknown time  . Calcium Carbonate-Vitamin D (CALTRATE 600+D) 600-400 MG-UNIT per tablet Take 1 tablet by mouth daily.     03/04/2015 at Unknown time  . Cholecalciferol (VITAMIN D3) 1000 UNITS CAPS Take 1 capsule by mouth daily.    03/04/2015 at Unknown time  . citalopram (CELEXA) 20 MG tablet Take 0.5 tablets (10 mg total) by mouth daily. 90 tablet 0 03/04/2015 at Unknown time  . cloNIDine (CATAPRES) 0.2 MG tablet Take 1 tablet (0.2 mg total) by mouth 3 (three) times daily. 90 tablet 0 03/04/2015 at Unknown time  . folic acid (FOLVITE) 1 MG tablet TAKE ONE TABLET BY MOUTH ONCE DAILY 90 tablet 3 03/04/2015 at Unknown time  . ketotifen (ZADITOR) 0.025 % ophthalmic solution Place 1 drop into both eyes 2 (two) times daily.   03/04/2015 at Unknown time  . lactulose (CHRONULAC) 10 GM/15ML solution Take 30 mLs (20 g total) by mouth 2 (two) times daily. 240 mL 0 03/04/2015 at Unknown time  . Multiple Vitamin (MULTIVITAMIN) tablet Take 1 tablet by mouth daily.     03/04/2015 at Unknown time  . Polyethyl Glycol-Propyl Glycol (SYSTANE OP) Place 1 drop into both eyes daily.   03/04/2015 at Unknown time  . propranolol (INDERAL) 20 MG tablet TAKE ONE TABLET BY MOUTH TWICE DAILY 60 tablet 6 03/04/2015 at 0800  .  thiamine (VITAMIN B-1) 100 MG tablet Take 1 tablet (100 mg total) by mouth daily. 90 tablet 3 03/04/2015 at Unknown time    Assessment: 69 y/o female admitted for syncopal event. She had a recent admission for hyponatremia and was found to have a liver and kidney mass c/w hepatocellular carcinoma. Pharmacy consulted to begin Lovenox for r/o PE.   SCr improved down to 1.34 today. CrCl ~30-35 mL/min. No I/Os charted. RN reports no urinary incontinence. D-dimer 1.23 - elevated.    Goal of Therapy:  Anti-Xa level 0.6-1 units/ml 4hrs after LMWH dose given Monitor platelets by  anticoagulation protocol: Yes   Plan:  - Increase Lovenox to 55mg  SQ every 12 hours due to improvement in CrCl and continuing treatment dose for now (d-dimer positive).  - Watch renal function closely as borderline.  - CBC q72h while on Lovenox - Monitor for s/sx of bleeding  [facility], Pharm.D., BCPS Clinical Pharmacist Pager: [phone] 03/06/2015 8:00 AM

## 2015-03-06 NOTE — Progress Notes (Signed)
*  PRELIMINARY RESULTS* Vascular Ultrasound Lower extremity venous duplex has been completed.  Preliminary findings: no evidence of DVT.   Landry Mellow, RDMS, RVT  03/06/2015, 12:35 PM

## 2015-03-07 LAB — COMPREHENSIVE METABOLIC PANEL
ALK PHOS: 40 U/L (ref 39–117)
ALT: 18 U/L (ref 0–35)
AST: 29 U/L (ref 0–37)
Albumin: 3.1 g/dL — ABNORMAL LOW (ref 3.5–5.2)
Anion gap: 9 (ref 5–15)
BUN: <5 mg/dL — ABNORMAL LOW (ref 6–23)
CALCIUM: 9.6 mg/dL (ref 8.4–10.5)
CHLORIDE: 103 mmol/L (ref 96–112)
CO2: 23 mmol/L (ref 19–32)
Creatinine, Ser: 1.16 mg/dL — ABNORMAL HIGH (ref 0.50–1.10)
GFR calc Af Amer: 54 mL/min — ABNORMAL LOW (ref >90)
GFR calc non Af Amer: 47 mL/min — ABNORMAL LOW (ref >90)
Glucose, Bld: 98 mg/dL (ref 70–99)
POTASSIUM: 3.2 mmol/L — AB (ref 3.5–5.1)
SODIUM: 135 mmol/L (ref 135–145)
Total Bilirubin: 0.8 mg/dL (ref 0.3–1.2)
Total Protein: 7.3 g/dL (ref 6.0–8.3)

## 2015-03-07 LAB — CBC
HCT: 35.2 % — ABNORMAL LOW (ref 36.0–46.0)
HEMOGLOBIN: 12.1 g/dL (ref 12.0–15.0)
MCH: 31.6 pg (ref 26.0–34.0)
MCHC: 34.4 g/dL (ref 30.0–36.0)
MCV: 91.9 fL (ref 78.0–100.0)
Platelets: 188 10*3/uL (ref 150–400)
RBC: 3.83 MIL/uL — ABNORMAL LOW (ref 3.87–5.11)
RDW: 13 % (ref 11.5–15.5)
WBC: 4.7 10*3/uL (ref 4.0–10.5)

## 2015-03-07 LAB — CORTISOL: Cortisol, Plasma: 11.5 ug/dL

## 2015-03-07 MED ORDER — ISOSORBIDE DINITRATE 10 MG PO TABS
10.0000 mg | ORAL_TABLET | Freq: Three times a day (TID) | ORAL | Status: DC
  Administered 2015-03-07: 10 mg via ORAL
  Filled 2015-03-07: qty 1

## 2015-03-07 MED ORDER — LACTULOSE 10 GM/15ML PO SOLN: 20.0000 g | Freq: Every day | ORAL | Status: DC

## 2015-03-07 MED ORDER — IBUPROFEN 200 MG PO TABS: 200.0000 mg | ORAL_TABLET | Freq: Four times a day (QID) | ORAL | Status: DC | PRN

## 2015-03-07 MED ORDER — PROPRANOLOL HCL 20 MG PO TABS
20.0000 mg | ORAL_TABLET | Freq: Two times a day (BID) | ORAL | Status: DC
  Administered 2015-03-07: 20 mg via ORAL
  Filled 2015-03-07 (×2): qty 1

## 2015-03-07 MED ORDER — POTASSIUM CHLORIDE CRYS ER 20 MEQ PO TBCR
30.0000 meq | EXTENDED_RELEASE_TABLET | Freq: Once | ORAL | Status: AC
  Administered 2015-03-07: 30 meq via ORAL
  Filled 2015-03-07 (×2): qty 1

## 2015-03-07 MED ORDER — POTASSIUM CHLORIDE CRYS ER 20 MEQ PO TBCR: 40.0000 meq | EXTENDED_RELEASE_TABLET | Freq: Once | ORAL | Status: DC

## 2015-03-07 MED ORDER — HYDRALAZINE HCL 100 MG PO TABS: 100.0000 mg | ORAL_TABLET | Freq: Three times a day (TID) | ORAL | Status: DC

## 2015-03-07 MED ORDER — ISOSORBIDE DINITRATE 10 MG PO TABS: 10.0000 mg | ORAL_TABLET | Freq: Three times a day (TID) | ORAL | Status: DC

## 2015-03-07 MED ORDER — ENOXAPARIN SODIUM 30 MG/0.3ML ~~LOC~~ SOLN: 30.0000 mg | SUBCUTANEOUS | Status: DC

## 2015-03-07 NOTE — Discharge Instructions (Signed)
Syncope  Syncope is a medical term for fainting or passing out. This means you lose consciousness and drop to the ground. People are generally unconscious for less than 5 minutes. You may have some muscle twitches for up to 15 seconds before waking up and returning to normal. Syncope occurs more often in older adults, but it can happen to anyone. While most causes of syncope are not dangerous, syncope can be a sign of a serious medical problem. It is important to seek medical care.  CAUSES  Syncope is caused by a sudden drop in blood flow to the brain. The specific cause is often not determined. Factors that can bring on syncope include:  Taking medicines that lower blood pressure.  Sudden changes in posture, such as standing up quickly.  Taking more medicine than prescribed.  Standing in one place for too long.  Seizure disorders.  Dehydration and excessive exposure to heat.  Low blood sugar (hypoglycemia).  Straining to have a bowel movement.  Heart disease, irregular heartbeat, or other circulatory problems.  Fear, emotional distress, seeing blood, or severe pain. SYMPTOMS  Right before fainting, you may:  Feel dizzy or light-headed.  Feel nauseous.  See all white or all black in your field of vision.  Have cold, clammy skin. DIAGNOSIS  Your health care provider will ask about your symptoms, perform a physical exam, and perform an electrocardiogram (ECG) to record the electrical activity of your heart. Your health care provider may also perform other heart or blood tests to determine the cause of your syncope which may include:  Transthoracic echocardiogram (TTE). During echocardiography, sound waves are used to evaluate how blood flows through your heart.  Transesophageal echocardiogram (TEE).  Cardiac monitoring. This allows your health care provider to monitor your heart rate and rhythm in real time.  Holter monitor. This is a portable device that records your  heartbeat and can help diagnose heart arrhythmias. It allows your health care provider to track your heart activity for several days, if needed.  Stress tests by exercise or by giving medicine that makes the heart beat faster. TREATMENT  In most cases, no treatment is needed. Depending on the cause of your syncope, your health care provider may recommend changing or stopping some of your medicines. HOME CARE INSTRUCTIONS  Have someone stay with you until you feel stable.  Do not drive, use machinery, or play sports until your health care provider says it is okay.  Keep all follow-up appointments as directed by your health care provider.  Lie down right away if you start feeling like you might faint. Breathe deeply and steadily. Wait until all the symptoms have passed.  Drink enough fluids to keep your urine clear or pale yellow.  If you are taking blood pressure or heart medicine, get up slowly and take several minutes to sit and then stand. This can reduce dizziness. SEEK IMMEDIATE MEDICAL CARE IF:   You have a severe headache.  You have unusual pain in the chest, abdomen, or back.  You are bleeding from your mouth or rectum, or you have black or tarry stool.  You have an irregular or very fast heartbeat.  You have pain with breathing.  You have repeated fainting or seizure-like jerking during an episode.  You faint when sitting or lying down.  You have confusion.  You have trouble walking.  You have severe weakness.  You have vision problems. If you fainted, call your local emergency services (911 in [place] Do not  drive yourself to the hospital.  MAKE SURE YOU:  Understand these instructions.  Will watch your condition.  Will get help right away if you are not doing well or get worse. Document Released: 12/04/2005 Document Revised: 12/09/2013 Document Reviewed: 02/02/2012 [facility] Patient Information 2015 [place], [place]. This information is not intended to replace  advice given to you by your health care provider. Make sure you discuss any questions you have with your health care provider.  Hypertension  Hypertension, commonly called high blood pressure, is when the force of blood pumping through your arteries is too strong. Your arteries are the blood vessels that carry blood from your heart throughout your body. A blood pressure reading consists of a higher number over a lower number, such as 110/72. The higher number (systolic) is the pressure inside your arteries when your heart pumps. The lower number (diastolic) is the pressure inside your arteries when your heart relaxes. Ideally you want your blood pressure below 120/80. Hypertension forces your heart to work harder to pump blood. Your arteries may become narrow or stiff. Having hypertension puts you at risk for heart disease, stroke, and other problems.  RISK FACTORS Some risk factors for high blood pressure are controllable. Others are not.  Risk factors you cannot control include:   Race. You may be at higher risk if you are African American.  Age. Risk increases with age.  Gender. Men are at higher risk than women before age 66 years. After age 56, women are at higher risk than men. Risk factors you can control include:  Not getting enough exercise or physical activity.  Being overweight.  Getting too much fat, sugar, calories, or salt in your diet.  Drinking too much alcohol. SIGNS AND SYMPTOMS Hypertension does not usually cause signs or symptoms. Extremely high blood pressure (hypertensive crisis) may cause headache, anxiety, shortness of breath, and nosebleed. DIAGNOSIS  To check if you have hypertension, your health care provider will measure your blood pressure while you are seated, with your arm held at the level of your heart. It should be measured at least twice using the same arm. Certain conditions can cause a difference in blood pressure between your right and left arms. A  blood pressure reading that is higher than normal on one occasion does not mean that you need treatment. If one blood pressure reading is high, ask your health care provider about having it checked again. TREATMENT  Treating high blood pressure includes making lifestyle changes and possibly taking medicine. Living a healthy lifestyle can help lower high blood pressure. You may need to change some of your habits. Lifestyle changes may include:  Following the DASH diet. This diet is high in fruits, vegetables, and whole grains. It is low in salt, red meat, and added sugars.  Getting at least 2 hours of brisk physical activity every week.  Losing weight if necessary.  Not smoking.  Limiting alcoholic beverages.  Learning ways to reduce stress. If lifestyle changes are not enough to get your blood pressure under control, your health care provider may prescribe medicine. You may need to take more than one. Work closely with your health care provider to understand the risks and benefits. HOME CARE INSTRUCTIONS  Have your blood pressure rechecked as directed by your health care provider.   Take medicines only as directed by your health care provider. Follow the directions carefully. Blood pressure medicines must be taken as prescribed. The medicine does not work as well when you skip doses.  Skipping doses also puts you at risk for problems.   Do not smoke.   Monitor your blood pressure at home as directed by your health care provider. SEEK MEDICAL CARE IF:   You think you are having a reaction to medicines taken.  You have recurrent headaches or feel dizzy.  You have swelling in your ankles.  You have trouble with your vision. SEEK IMMEDIATE MEDICAL CARE IF:  You develop a severe headache or confusion.  You have unusual weakness, numbness, or feel faint.  You have severe chest or abdominal pain.  You vomit repeatedly.  You have trouble breathing. MAKE SURE YOU:    Understand these instructions.  Will watch your condition.  Will get help right away if you are not doing well or get worse. Document Released: 12/04/2005 Document Revised: 04/20/2014 Document Reviewed: 09/26/2013 [facility] Patient Information 2015 [place], [place]. This information is not intended to replace advice given to you by your health care provider. Make sure you discuss any questions you have with your health care provider.

## 2015-03-07 NOTE — Progress Notes (Signed)
UR Completed.  [phone]  

## 2015-03-07 NOTE — Discharge Summary (Signed)
DISCHARGE SUMMARY  Sarah Harmon  MR#: [id number]  DOB:Jan 20, 1946  Date of Admission: 03/04/2015 Date of Discharge: 03/07/2015  Attending Physician:Quanita Barona T  Patient's [id number] Jenny Reichmann, MD  Consults:  none  Disposition: D/C home   Follow-up Appts:     Follow-up Information    Follow up with Cathlean Cower, MD. Schedule an appointment as soon as possible for a visit in 3 days.   Specialties:  Internal Medicine, Radiology   Contact information:   [address] [place] [address] [phone]       Follow up with [facility].   Why:  Keep your scheduled appointment later this week.        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 female who was recently admitted for hyponatremia and was found to have a liver mass c/w hepatocellular carcinoma which led to referal to an outpatient GI oncologist for further workup. Patient was found to have sudden onset of loss of consciousness while talking to her daughter the evening of her readmission. Patient had a brief episodes of jerking spells and eye rolling back. Patient did not have any incontinence of urine or tongue bite. Patient regained consciousness spontaneously and did not recall the incident.   In the ER the patient was found to have a blood pressure > [id number] systolic with sinus bradycardia. Patient denied any chest pain shortness of breath visual symptoms or any weakness of upper or lower extremities. Had brief episodes of headache. CT head did not show anything acute. Patient has history of alcoholism  but last drink was prior to last admission.   Hospital Course:  Syncope MRI head w/o acute findings - TTE 3/14 w/o signif findings - pt is at risk for occult PE given her known CA and recent hospitalization - d-dimer was + - venous duplex negative for DVT - CTa chest w/o evidence of PE - etiology not clear but sx have not recurred - pt was found to be bradycardic at admission (HR 40-50), and was on both a BB and clonidine - both agents initially held - BB has been resumed - etiology of syncope not clear, but bradycardia v/s DH are at top of the differential - now asymptomatic   Hypertensive urgency  BP remains poorly controlled - clonidine stopped due to bradycardia - increase propranolol back to prior dose, but watch for bradycardia - hydralazine added to regimen - isosorbide added to regimen - possible element of white coat HTN - proceed w/ d/c as pt has f/u appntmnt in 48hrs w/ her PCP   Hypercalcemia Ca 10.6 at admit - improved w/ hydration - consideration was given to paraneoplastic syndrome due to [facility], but this rapidly improved w/ gentle volume - was not high enough to warrant calcitonin  Acute on chronic kidney disease  Baseline crt ~1.4 - peaked at 1.93 at presentation - improved w/ careful hydration to 1.16 at time of d/c - f/u renal fxn as outpt is suggested as pt underwent IV contrast for CTangio  Hepatocellular Carcinoma 1.5cm lesion in R hepatic lobe - due for outpt eval this week   Cirrhosis  of the liver due to alcohol Ammonia 34 at admit - INR normal 3/14 - LFTs not markedly elevated and Tbil normal   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 Take with a full glass of water on an empty stomach.     ALPRAZolam 0.5 MG tablet  Commonly known as:  XANAX  TAKE ONE TABLET BY MOUTH AT BEDTIME AS NEEDED     amLODipine 5 MG tablet  Commonly known as:  NORVASC  Take 2 tablets (10 mg total) by mouth daily.     citalopram 20 MG tablet  Commonly known as:  CELEXA  Take 0.5 tablets (10 mg total) by mouth daily.     folic acid 1 MG tablet  Commonly known as:  FOLVITE  TAKE ONE TABLET BY MOUTH ONCE DAILY     hydrALAZINE 100 MG tablet  Commonly known as:  APRESOLINE  Take 1 tablet (100 mg total) by mouth 3 (three) times daily.     ibuprofen 200 MG tablet  Commonly known as:  ADVIL,MOTRIN  Take 1-2 tablets (200-400 mg total) by mouth every 6 (six) hours as needed for fever, headache, mild pain or moderate pain.     isosorbide dinitrate 10 MG tablet  Commonly known as:  ISORDIL  Take 1 tablet (10 mg total) by mouth 3 (three) times daily with meals.     ketotifen 0.025 % ophthalmic solution  Commonly known as:  ZADITOR  Place 1 drop into both eyes 2 (two) times daily.     lactulose 10 GM/15ML solution  Commonly known as:  CHRONULAC  Take 30 mLs (20 g total) by mouth daily.     multivitamin tablet  Take 1 tablet by mouth daily.     propranolol 20 MG tablet  Commonly known as:  INDERAL  TAKE ONE TABLET BY MOUTH TWICE DAILY     SYSTANE OP  Place 1 drop into both eyes daily.     thiamine 100 MG tablet  Commonly known as:  VITAMIN B-1  Take 1 tablet (100 mg total) by mouth daily.     Vitamin D3 1000 UNITS Caps  Take 1 capsule by mouth daily.        Day of Discharge BP 181/79 mmHg  Pulse 77  Temp(Src) 98.1 F (36.7 C) (Oral)  Resp 21  Ht 5\' 3"  (1.6 m)  Wt 55.339 kg (122 lb)  BMI  21.62 kg/m2  SpO2 99%  Physical Exam: General: No acute respiratory distress Lungs: Clear to auscultation bilaterally without wheezes or crackles Cardiovascular: Regular rate and rhythm without murmur gallop or rub normal S1 and S2 Abdomen: Nontender, nondistended, soft, bowel sounds positive, no rebound, no ascites, no appreciable mass Extremities: No significant cyanosis, clubbing, or edema bilateral lower extremities  Basic Metabolic Panel:  Recent  Labs Lab 03/02/15 0040 03/04/15 1952 03/05/15 0611 03/06/15 0414 03/07/15 0405  NA 125* 131* 130* 132* 135  K 4.3 4.3 4.0 3.5 3.2*  CL 94* 95* 92* 97 103  CO2 23 28 30 25 23   GLUCOSE 111* 89 96 93 98  BUN 13 10 10 7  <5*  CREATININE 1.56* 1.93* 1.56* 1.35* 1.16*  CALCIUM 9.1 10.6* 11.0* 10.3 9.6    Liver Function Tests:  Recent Labs Lab 03/01/15 0352 03/04/15 1952 03/05/15 0611 03/06/15 0414 03/07/15 0405  AST 31 43* 30 32 29  ALT 20 26 21 20 18   ALKPHOS 39 45 41 42 40  BILITOT 0.8 0.4 0.7 0.5 0.8  PROT 7.1 8.2 7.9 6.8 7.3  ALBUMIN 2.9* 3.4* 3.1* 2.9* 3.1*    Recent Labs Lab 02/28/15 0852 03/01/15 0352 03/04/15 1952  AMMONIA 55* 19 34*   Coags:  Recent Labs Lab 03/01/15 0352  INR 1.11   CBC:  Recent Labs Lab 03/01/15 0352 03/02/15 0040 03/04/15 1952 03/05/15 0611 03/06/15 0414 03/07/15 0405  WBC 3.3* 2.9* 3.4* 3.1* 4.1 4.7  NEUTROABS 1.7  --  1.7 1.4*  --   --   HGB 11.9* 11.3* 13.1 12.2 11.7* 12.1  HCT 34.7* 33.3* 37.9 35.3* 34.1* 35.2*  MCV 93.0 92.8 93.8 93.4 92.4 91.9  PLT 114* 130* 127* 146* 161 188    Recent Results (from the past 240 hour(s))  MRSA PCR Screening     Status: None   Collection Time: 02/26/15 11:15 PM  Result Value Ref Range Status   MRSA by PCR NEGATIVE NEGATIVE Final    Comment:        The GeneXpert MRSA Assay (FDA approved for NASAL specimens only), is one component of a comprehensive MRSA colonization surveillance program. It is not intended to diagnose  MRSA infection nor to guide or monitor treatment for MRSA infections.       Time spent in discharge (includes decision making & examination of pt): >35 minutes  03/07/2015, 8:36 AM   Cherene Altes, MD [facility] Office  [phone] Pager [phone]  On-Call/Text Page:      [email or web address]      password [facility]

## 2015-03-09 ENCOUNTER — Ambulatory Visit (INDEPENDENT_AMBULATORY_CARE_PROVIDER_SITE_OTHER): Payer: Medicare Other | Admitting: Internal Medicine

## 2015-03-09 ENCOUNTER — Encounter: Payer: Self-pay | Admitting: Internal Medicine

## 2015-03-09 ENCOUNTER — Other Ambulatory Visit (INDEPENDENT_AMBULATORY_CARE_PROVIDER_SITE_OTHER): Payer: Medicare Other

## 2015-03-09 VITALS — BP 122/70 | HR 61 | Temp 98.3°F | Resp 18 | Ht 63.0 in | Wt 125.1 lb

## 2015-03-09 DIAGNOSIS — I10 Essential (primary) hypertension: Secondary | ICD-10-CM

## 2015-03-09 DIAGNOSIS — N183 Chronic kidney disease, stage 3 unspecified: Secondary | ICD-10-CM

## 2015-03-09 DIAGNOSIS — E871 Hypo-osmolality and hyponatremia: Secondary | ICD-10-CM | POA: Diagnosis not present

## 2015-03-09 DIAGNOSIS — K7030 Alcoholic cirrhosis of liver without ascites: Secondary | ICD-10-CM

## 2015-03-09 LAB — BASIC METABOLIC PANEL
BUN: 14 mg/dL (ref 6–23)
CALCIUM: 9.9 mg/dL (ref 8.4–10.5)
CO2: 27 mEq/L (ref 19–32)
Chloride: 95 mEq/L — ABNORMAL LOW (ref 96–112)
Creatinine, Ser: 1.69 mg/dL — ABNORMAL HIGH (ref 0.40–1.20)
GFR: 38.43 mL/min — ABNORMAL LOW (ref >60.00)
Glucose, Bld: 94 mg/dL (ref 70–99)
Potassium: 4.4 mEq/L (ref 3.5–5.1)
Sodium: 126 mEq/L — ABNORMAL LOW (ref 135–145)

## 2015-03-09 LAB — AMMONIA: AMMONIA: 26 umol/L (ref 11–35)

## 2015-03-09 NOTE — Assessment & Plan Note (Signed)
Also to f/u lab and K as well per recommendation of hospitalist

## 2015-03-09 NOTE — Patient Instructions (Signed)
Please continue all other medications as before, and refills have been done if requested.  Please have the pharmacy call with any other refills you may need.  Please keep your appointments with your specialists as you may have planned  Please go to the LAB in the Basement (turn left off the elevator) for the tests to be done today  You will be contacted by phone if any changes need to be made immediately.  Otherwise, you will receive a letter about your results with an explanation, but please check with MyChart first

## 2015-03-09 NOTE — Progress Notes (Signed)
[place]  Telephone:([phone]) [phone] Fax:([phone]) [place] Note   Patient Care Team: Biagio Borg, MD as PCP - General 03/10/2015  CHIEF COMPLAINTS/PURPOSE OF CONSULTATION:  Newly diagnosed Hepatocellular carcinoma  HISTORY OF PRESENTING ILLNESS:  Sarah Harmon 69 y.o. female is here because of abnormal liver MRI, which is highly suspicious for [facility].  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 No history of ascites or GI bleeding. She has been followed I her primary care physician since then, has not been read by GI. she did quit drinking a few times, and started drinking again about 2 years ago.  She was seen by PCP for routine checkup and was found to have low sodium, she was referred to Hospital and was admitted. She had abdominal ultrasound done on 05/31/2015 which showed liver cirrhosis and a questionable mass in the right lobe measuring 2 cm. She underwent a liver MRI on same day, which showed a 1.5 cm enhancing lesion in the right lobe of liver, and an additional 1.0 cm indeterminate lesion in the liver. AFP was found to be elevated at 8.4. No liver biopsy was performed. She was subsequently referred to our cancer center for further evaluation.  She lives with her son at home. She is able to do all her ADLs, and some light housework. She goes out for shopping with her daughters, does not drive. She denies any abdominal discomfort or bloating, no nausea, vomiting, diarrhea or constipation. Her appetite and energy level is moderate, unchanged from before. No recent weight loss.   MEDICAL HISTORY:  Past Medical History  Diagnosis Date  . ALCOHOL ABUSE 07/13/2010  . ANXIETY 12/22/2009  . DEPRESSION 12/22/2009  . 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 FATIGUE 12/23/2010  . GALLSTONES 12/22/2009  . HYPERLIPIDEMIA 12/22/2009  . HYPERTENSION 12/22/2009  . HYPONATREMIA  12/22/2009  . 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 OSTEOPENIA 12/22/2009  . Pancytopenia 07/13/2010  . RENAL CYST, LEFT 12/22/2009  . VITAMIN D DEFICIENCY 12/23/2010  . WEIGHT LOSS 12/23/2010  . DJD (degenerative joint disease), cervical   . Renal cyst   . Gallstones     SURGICAL HISTORY: History reviewed. No pertinent past surgical history.  SOCIAL HISTORY: History   Social History  . Marital Status: Widowed    Spouse Name: N/A  . Number of Children: 5, she lives with one of her son   . Years of Education: N/A   Occupational History  . Retired    Social History Main Topics  . Smoking status: Never Smoker   . Smokeless tobacco: Not on file  . Alcohol Use: Yes  . Drug Use: No  . Sexual Activity: Not on file   Other Topics Concern  . Not on file   Social History Narrative    FAMILY HISTORY: Family History  Problem Relation Age of Onset  . Cancer Sister     breast cancer   . Cancer Brother     lung   . Cancer Sister     gastric cancer   . Cancer Sister     lung cancer  . Cancer Other     breast cancer     ALLERGIES:  has No Known Allergies.  MEDICATIONS:  Current Outpatient Prescriptions  Medication Sig Dispense Refill  . alendronate (FOSAMAX) 70 MG tablet Take 1 tablet (70 mg total) by mouth every 7 (seven) days. Take with  a full glass of water on an empty stomach. (Patient taking differently: Take 70 mg by mouth every 7 (seven) days. Take with a full glass of water on an empty stomach. Take every Wednesday) 12 tablet 3  . ALPRAZolam (XANAX) 0.5 MG tablet TAKE ONE TABLET BY MOUTH AT BEDTIME AS NEEDED (Patient taking differently: Take 0.5 mg by mouth at bedtime. ) 90 tablet 1  . amLODipine (NORVASC) 5 MG tablet Take 2 tablets (10 mg total) by mouth daily. 90 tablet 0  . Cholecalciferol (VITAMIN D3) 1000 UNITS CAPS Take 1 capsule by mouth daily.     . citalopram (CELEXA) 20 MG tablet Take 0.5 tablets (10 mg total) by mouth daily. 90 tablet 0  . folic acid (FOLVITE) 1 MG  tablet TAKE ONE TABLET BY MOUTH ONCE DAILY 90 tablet 3  . hydrALAZINE (APRESOLINE) 100 MG tablet Take 1 tablet (100 mg total) by mouth 3 (three) times daily. 90 tablet 0  . ibuprofen (ADVIL,MOTRIN) 200 MG tablet Take 1-2 tablets (200-400 mg total) by mouth every 6 (six) hours as needed for fever, headache, mild pain or moderate pain. 30 tablet 0  . isosorbide dinitrate (ISORDIL) 10 MG tablet Take 1 tablet (10 mg total) by mouth 3 (three) times daily with meals. 90 tablet 0  . ketotifen (ZADITOR) 0.025 % ophthalmic solution Place 1 drop into both eyes 2 (two) times daily.    Marland Kitchen lactulose (CHRONULAC) 10 GM/15ML solution Take 30 mLs (20 g total) by mouth daily. 240 mL 0  . Multiple Vitamin (MULTIVITAMIN) tablet Take 1 tablet by mouth daily.      Vladimir Faster Glycol-Propyl Glycol (SYSTANE OP) Place 1 drop into both eyes daily.    . propranolol (INDERAL) 20 MG tablet TAKE ONE TABLET BY MOUTH TWICE DAILY 60 tablet 6   No current facility-administered medications for this visit.    REVIEW OF SYSTEMS:   Constitutional: Denies fevers, chills or abnormal night sweats Eyes: Denies blurriness of vision, double vision or watery eyes Ears, nose, mouth, throat, and face: Denies mucositis or sore throat Respiratory: Denies cough, dyspnea or wheezes Cardiovascular: Denies palpitation, chest discomfort or lower extremity swelling Gastrointestinal:  Denies nausea, heartburn or change in bowel habits Skin: Denies abnormal skin rashes Lymphatics: Denies new lymphadenopathy or easy bruising Neurological:Denies numbness, tingling or new weaknesses Behavioral/Psych: Mood is stable, no new changes  All other systems were reviewed with the patient and are negative.  PHYSICAL EXAMINATION: ECOG PERFORMANCE STATUS: 1 - Symptomatic but completely ambulatory  Filed Vitals:   03/10/15 1407  BP: 203/86  Pulse: 67  Temp:   Resp:    Filed Weights   03/10/15 1355  Weight: 124 lb 4.8 oz (56.382 kg)     GENERAL:alert, no distress and comfortable SKIN: skin color, texture, turgor are normal, no rashes or significant lesions EYES: normal, conjunctiva are pink and non-injected, sclera clear OROPHARYNX:no exudate, no erythema and lips, buccal mucosa, and tongue normal  NECK: supple, thyroid normal size, non-tender, without nodularity LYMPH:  no palpable lymphadenopathy in the cervical, axillary or inguinal LUNGS: clear to auscultation and percussion with normal breathing effort HEART: regular rate & rhythm and no murmurs and no lower extremity edema ABDOMEN:abdomen soft, non-tender and normal bowel sounds Musculoskeletal:no cyanosis of digits and no clubbing  PSYCH: alert & oriented x 3 with fluent speech NEURO: no focal motor/sensory deficits  LABORATORY DATA:  I have reviewed the data as listed Lab Results  Component Value Date   WBC 4.7 03/07/2015  HGB 12.1 03/07/2015   HCT 35.2* 03/07/2015   MCV 91.9 03/07/2015   PLT 188 03/07/2015    Recent Labs  02/26/15 1043  03/05/15 0611 03/06/15 0414 03/07/15 0405 03/09/15 1155  NA 118*  < > 130* 132* 135 126*  K 4.6  < > 4.0 3.5 3.2* 4.4  CL 86*  < > 92* 97 103 95*  CO2 27  < > 30 25 23 27   GLUCOSE 76  < > 96 93 98 94  BUN 13  < > 10 7 <5* 14  CREATININE 1.17  < > 1.56* 1.35* 1.16* 1.69*  CALCIUM 10.5  < > 11.0* 10.3 9.6 9.9  GFRNONAA  --   < > 33* 39* 47*  --   GFRAA  --   < > 38* 45* 54*  --   PROT 8.2  < > 7.9 6.8 7.3  --   ALBUMIN 3.7  < > 3.1* 2.9* 3.1*  --   AST 35  < > 30 32 29  --   ALT 24  < > 21 20 18   --   ALKPHOS 54  < > 41 42 40  --   BILITOT 0.5  < > 0.7 0.5 0.8  --   BILIDIR 0.1  --   --   --   --   --   < > = values in this interval not displayed.  AFP tumor marker  Status: Finalresult Visible to patient:  MyChart Nextappt: 03/10/2015 at 01:30 PM in Oncology ([place])            Ref Range 10d ago    AFP-Tumor Marker 0.0 - 8.3 ng/mL 8.4 (H)          RADIOGRAPHIC STUDIES: I have personally reviewed the radiological images as listed and agreed with the findings in the report. Ct Head Wo Contrast  03/04/2015   CLINICAL DATA:  Syncopal episode, no fall. Found sitting up in a chair unconscious.  EXAM: CT HEAD WITHOUT CONTRAST  TECHNIQUE: Contiguous axial images were obtained from the base of the skull through the vertex without intravenous contrast.  COMPARISON:  06/04/2010  FINDINGS: There is no evidence of mass effect, midline shift, or extra-axial fluid collections. There is no evidence of a space-occupying lesion or intracranial hemorrhage. There is no evidence of a cortical-based area of acute infarction. There is generalized cerebral atrophy. There is periventricular white matter low attenuation likely secondary to microangiopathy.  The ventricles and sulci are appropriate for the patient's age. The basal cisterns are patent.  Visualized portions of the orbits are unremarkable. The visualized portions of the paranasal sinuses and mastoid air cells are unremarkable. Cerebrovascular atherosclerotic calcifications are noted.  The osseous structures are unremarkable.  IMPRESSION: 1. No acute intracranial pathology. 2. Chronic microvascular disease and cerebral atrophy.   Electronically Signed   By: Kathreen Devoid   On: 03/04/2015 20:20   Ct Angio Chest Pe W/cm &/or Wo Cm  03/06/2015   CLINICAL DATA:  70 year old female with elevated D-dimer. Hypertension, sinus bradycardia and hepatic cellular carcinoma.  EXAM: CT ANGIOGRAPHY CHEST WITH CONTRAST  TECHNIQUE: Multidetector CT imaging of the chest was performed using the standard protocol during bolus administration of intravenous contrast. Multiplanar CT image reconstructions and MIPs were obtained to evaluate the vascular anatomy.  CONTRAST:  16mL OMNIPAQUE IOHEXOL 350 MG/ML SOLN  COMPARISON:  02/04/2008 CT.  02/27/2015 and prior chest radiographs  FINDINGS: This is a technically adequate study although  respiratory  motion artifact decreases sensitivity in several portions of the lungs.  Mediastinum/Nodes: No definite pulmonary emboli are identified. 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  There is no evidence of thoracic aortic aneurysm. No pleural or pericardial effusions identified.  Lungs/Pleura: A 3 mm left apical nodule is unchanged from 2009. No airspace disease, suspicious nodule, mass or consolidation identified.  Upper abdomen: Cirrhosis and cholelithiasis again noted.  Musculoskeletal: No acute or suspicious abnormalities noted.  Review of the MIP images confirms the above findings.  IMPRESSION: No evidence of acute abnormality.  No evidence of pulmonary emboli.  Cirrhosis and cholelithiasis.  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   Electronically Signed   By: Margarette Canada M.D.   On: 03/06/2015 19:21   Mr Brain Wo Contrast  03/05/2015   CLINICAL DATA:  Syncope.  EXAM: MRI HEAD WITHOUT CONTRAST  TECHNIQUE: Multiplanar, multiecho pulse sequences of the brain and surrounding structures were obtained without intravenous contrast.  COMPARISON:  Head CT 03/04/2015 and MRI 02/03/2008  FINDINGS: There is no evidence of acute infarct, mass, midline shift, or extra-axial fluid collection. A few small foci of chronic microhemorrhage are noted in the left thalamus and right parietal lobe. Patchy and confluent T2 hyperintensities in the periventricular greater than subcortical cerebral white matter bilaterally are nonspecific but compatible with moderate chronic small vessel ischemic disease, increased from the prior MRI. Mild generalized cerebral atrophy is within normal limits for age. Prominent ossification is again seen along the falx. Chronic lacunar infarcts are noted in the thalami.  Orbits are unremarkable. Mild posterior right ethmoid air cell mucosal thickening is noted. Mastoid air  cells are clear. Abnormal distal left vertebral artery flow void is unchanged.  IMPRESSION: 1. No acute intracranial abnormality. 2. Moderate chronic small vessel ischemic disease.   Electronically Signed   By: Logan Bores   On: 03/05/2015 08:46   US Abdomen Complete  02/28/2015   CLINICAL DATA:  Alcoholic liver failure, alcohol abuse, hypertension, Laennec's cirrhosis, history pancreatitis, cirrhosis  EXAM: ULTRASOUND ABDOMEN COMPLETE  COMPARISON:  06/06/2010  FINDINGS: Gallbladder: Multiple shadowing calculi in gallbladder up to 1.8 cm diameter. Small amount of gallbladder sludge. No gallbladder wall thickening, pericholecystic fluid or sonographic Murphy sign.  Common bile duct: Diameter: 5 mm diameter normal  Liver: Heterogeneous with slightly nodular margins consistent with alcoholic cirrhosis. Heterogeneous area of hypo echogenicity in RIGHT lobe 1.7 x 2.0 x 2.0 cm unable to exclude mass/ tumor.  IVC: Normal appearance  Pancreas: Tail suboptimally visualized due to bowel gas, remaining portions normal appearance  Spleen: Normal appearance, 4.6 cm length  Right Kidney: Length: 9.5 cm. Normal cortical thickness. Minimally increased cortical echogenicity. No mass or hydronephrosis.  Left Kidney: Length: 10.4 cm. Normal cortical thickness. Increased cortical echogenicity. Complicated cyst at upper pole 2.6 x 2.0 x 2.3 cm. No hydronephrosis.  Abdominal aorta: Normal caliber with atherosclerotic calcifications.  Other findings: No ascites  IMPRESSION: Cirrhotic appearing liver with question mass in RIGHT lobe 2.0 cm greatest size; followup MR imaging recommended to assess.  Cholelithiasis without evidence acute cholecystitis.  Complicated cystic lesion at upper pole LEFT kidney 2.6 cm greatest size; this can be assessed at same time an as assessment of liver lesion.  Question medical renal disease changes, recommend correlation with renal function testing.   Electronically Signed   By: Lavonia Dana M.D.   On:  02/28/2015 09:09   Mr Liver W Wo  Contrast  03/01/2015   CLINICAL DATA:  Patient with history of hepatic cirrhosis secondary to alcoholism.  EXAM: MRI ABDOMEN WITHOUT AND WITH CONTRAST  TECHNIQUE: Multiplanar multisequence MR imaging of the abdomen was performed both before and after the administration of intravenous contrast.  CONTRAST:  5 mm Eovist  COMPARISON:  Ultrasound 02/28/2015  FINDINGS: Lower chest:  No definite pleural effusion.  Normal heart size.  Hepatobiliary: Within the peripheral right hepatic lobe there is a 1.5 cm intermediate T2 bright hyperenhancing lesion with rapid washout (image 25 ; series 1301) and peripheral rim enhancement.  Within the hepatic dome (image 4; series 16) (image 3; series 19) there is a 10 mm T2 bright lesion without definitive enhancement demonstrated on postcontrast images. The lesion is able to be visualized on the 20 minutes delayed sequence (image 16; series 21).  The liver is nodular in contour and cirrhotic in morphology. The left hepatic lobe is heterogeneous over the post contrast enhanced phases. There is a large gallstone demonstrated within the gallbladder lumen. No gallbladder wall thickening. No intrahepatic or extrahepatic biliary ductal dilatation.  Pancreas: Unremarkable  Spleen: Unremarkable  Adrenals/Urinary Tract: Normal bilateral adrenal glands. Kidneys enhance symmetrically with contrast. 3.8 x 2.2 cm multiloculated cyst within the superior pole of the left kidney, grossly stable dating back to 2008. No hydronephrosis.  Stomach/Bowel: Visualized small large bowel is unremarkable.  Vascular/Lymphatic: Visualized abdominal aorta is normal in caliber. No retroperitoneal lymphadenopathy.  Other: None  Musculoskeletal: Normal osseous marrow signal.    IMPRESSION: There is a 1.5 cm hyperenhancing T2 bright lesion within the right hepatic lobe which demonstrates washout. Imaging findings in the setting of cirrhosis are diagnostic of hepatocellular  carcinoma. In not already obtained, multidisciplinary (surgical, oncological and interventional radiology) consultation is recommended.  There is an indeterminate 10 mm T2 bright lesion within the hepatic dome which is confirmed on the 20 minutes post delayed images. No definite arterial phase enhancement is able to be demonstrated however evaluation of the hepatic dome is limited secondary to motion artifact.  Cholelithiasis.   Electronically Signed   By: Lovey Newcomer M.D.   On: 03/01/2015 10:20   Dg Chest Port 1 View  02/27/2015   CLINICAL DATA:  Hypernatremia, shortness of Breath  EXAM: PORTABLE CHEST - 1 VIEW  COMPARISON:  06/04/2010  FINDINGS: Lungs clear. Heart size upper limits normal. Atheromatous aorta. No pneumothorax. No effusion. Visualized skeletal structures are unremarkable.  IMPRESSION: No acute cardiopulmonary disease.   Electronically Signed   By: Lucrezia Europe M.D.   On: 02/27/2015 10:46    ASSESSMENT & PLAN:  69 year old African-American female, with history of alcohol liver cirrhosis with history of hepatic enphlopathy, uncontrolled HTN, anxiety and depression.  1. Stage I hepatocellular carcinoma  -I reviewed her liver MRI scan images with her and her family members in great detail. The 1.5 cm liver lesion in right lobe has typical arterial enhancement and venous washout, his consistent with [place], in the background of liver cirrhosis. The other 1.0 centimeter lesion does not have typical arterial enhancement, and is indeterminate.  -She also has elevated AFP. -Her liver MRI is diagnostic, I do not think she needs biopsy to confirm. -I discussed the option of surgical resection, liver transplant, percutaneous ablation by interventional radiology, with her in great details. She expressed her wish to proceed with treatment, and is willing to quit alcohol completely. She has preserved liver function, would be a candidate for these liver targeted therapy. -I'll refer her to surgeon  Dr.  Barry Dienes and liver transplant clinic. -We'll discuss her case in our GI tumor board in 2 weeks -I also discussed the several enhance plan after her initial treatment, giving the risks of cancer recurrence.  2. 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 Hypertension, poorly controlled -Her blood pressure was quite high during her office visit. She states it is rated to her anxiety and stress.  -She denies any chest pain headaches or other new symptoms -She will monitor her blood pressure at home and follow-up with her primary care physician  Follow-up, I'll see her after her surgery or ablation, for follow-up.    Orders Placed This Encounter  Procedures  . Ambulatory referral to General Surgery    Referral Priority:  Routine    Referral Type:  Surgical    Referral Reason:  Specialty Services Required    Referred to Provider:  Stark Klein, MD    Requested Specialty:  General Surgery    Number of Visits Requested:  1    All questions were answered. The patient knows to call the clinic with any problems, questions or concerns. I spent 45 minutes counseling the patient face to face. The total time spent in the appointment was 60 minutes and more than 50% was on counseling.     Truitt Merle, MD 03/10/2015 11:17 PM

## 2015-03-09 NOTE — Progress Notes (Signed)
Subjective:    Patient ID: Sarah Harmon, female    DOB: Mar 10, 1946, 69 y.o.   MRN: [id number]  HPI Here to f/u recent bradycardia/HTN urgency assoc with syncope in the setting of cirrhosis/lactulose need.  Pt denies chest pain, increased sob or doe, wheezing, orthopnea, PND, increased LE swelling, palpitations, dizziness or syncope.  Pt denies new neurological symptoms such as new headache, or facial or extremity weakness or numbness  No muscle cramping.  Had mild low K at time of d/c, sodium improved.  No new complaints Past Medical History  Diagnosis Date  . ALCOHOL ABUSE 07/13/2010  . ANXIETY 12/22/2009  . DEPRESSION 12/22/2009  . 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 FATIGUE 12/23/2010  . GALLSTONES 12/22/2009  . HYPERLIPIDEMIA 12/22/2009  . HYPERTENSION 12/22/2009  . HYPONATREMIA 12/22/2009  . 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 OSTEOPENIA 12/22/2009  . Pancytopenia 07/13/2010  . RENAL CYST, LEFT 12/22/2009  . VITAMIN D DEFICIENCY 12/23/2010  . WEIGHT LOSS 12/23/2010  . DJD (degenerative joint disease), cervical   . Renal cyst   . Gallstones    No past surgical history on file.  reports that she has never smoked. She does not have any smokeless tobacco history on file. She reports that she drinks alcohol. She reports that she does not use illicit drugs. family history includes Breast cancer in her sister. No Known Allergies Current Outpatient Prescriptions on File Prior to Visit  Medication Sig Dispense Refill  . alendronate (FOSAMAX) 70 MG tablet Take 1 tablet (70 mg total) by mouth every 7 (seven) days. Take with a full glass of water on an empty stomach. (Patient taking differently: Take 70 mg by mouth every 7 (seven) days. Take with a full glass of water on an empty stomach. Take every Wednesday) 12 tablet 3  . ALPRAZolam (XANAX) 0.5 MG tablet TAKE ONE TABLET BY MOUTH AT BEDTIME AS NEEDED (Patient taking differently: Take 0.5 mg by mouth at bedtime. ) 90 tablet 1  . amLODipine  (NORVASC) 5 MG tablet Take 2 tablets (10 mg total) by mouth daily. 90 tablet 0  . Cholecalciferol (VITAMIN D3) 1000 UNITS CAPS Take 1 capsule by mouth daily.     . citalopram (CELEXA) 20 MG tablet Take 0.5 tablets (10 mg total) by mouth daily. 90 tablet 0  . folic acid (FOLVITE) 1 MG tablet TAKE ONE TABLET BY MOUTH ONCE DAILY 90 tablet 3  . hydrALAZINE (APRESOLINE) 100 MG tablet Take 1 tablet (100 mg total) by mouth 3 (three) times daily. 90 tablet 0  . ibuprofen (ADVIL,MOTRIN) 200 MG tablet Take 1-2 tablets (200-400 mg total) by mouth every 6 (six) hours as needed for fever, headache, mild pain or moderate pain. 30 tablet 0  . isosorbide dinitrate (ISORDIL) 10 MG tablet Take 1 tablet (10 mg total) by mouth 3 (three) times daily with meals. 90 tablet 0  . ketotifen (ZADITOR) 0.025 % ophthalmic solution Place 1 drop into both eyes 2 (two) times daily.    Marland Kitchen lactulose (CHRONULAC) 10 GM/15ML solution Take 30 mLs (20 g total) by mouth daily. 240 mL 0  . Multiple Vitamin (MULTIVITAMIN) tablet Take 1 tablet by mouth daily.      Vladimir Faster Glycol-Propyl Glycol (SYSTANE OP) Place 1 drop into both eyes daily.    . propranolol (INDERAL) 20 MG tablet TAKE ONE TABLET BY MOUTH TWICE DAILY 60 tablet 6  . thiamine (VITAMIN B-1) 100 MG tablet Take 1 tablet (100 mg total) by mouth daily. [place]  tablet 3   No current facility-administered medications on file prior to visit.   Review of Systems  Constitutional: Negative for unusual diaphoresis or night sweats HENT: Negative for ringing in ear or discharge Eyes: Negative for double vision or worsening visual disturbance.  Respiratory: Negative for choking and stridor.   Gastrointestinal: Negative for vomiting or other signifcant bowel change Genitourinary: Negative for hematuria or change in urine volume.  Musculoskeletal: Negative for other MSK pain or swelling Skin: Negative for color change and worsening wound.  Neurological: Negative for tremors and numbness  other than noted  Psychiatric/Behavioral: Negative for decreased concentration or agitation other than above       Objective:   Physical Exam BP 122/70 mmHg  Pulse 61  Temp(Src) 98.3 F (36.8 C) (Oral)  Resp 18  Ht 5\' 3"  (1.6 m)  Wt 125 lb 1.9 oz (56.754 kg)  BMI 22.17 kg/m2  SpO2 98% VS noted,  Constitutional: Pt appears in no significant distress HENT: Head: NCAT.  Right Ear: External ear normal.  Left Ear: External ear normal.  Eyes: . Pupils are equal, round, and reactive to light. Conjunctivae and EOM are normal Neck: Normal range of motion. Neck supple.  Cardiovascular: Normal rate and regular rhythm.   Pulmonary/Chest: Effort normal and breath sounds without rales or wheezing.  Abd:  Soft, NT, ND, + BS Neurological: Pt is alert. Not confused , motor grossly intact Skin: Skin is warm. No rash, no LE edema Psychiatric: Pt behavior is normal. No agitation.     Assessment & Plan:

## 2015-03-09 NOTE — Assessment & Plan Note (Signed)
Stable volume, also f/u today lab,  to f/u any worsening symptoms or concerns

## 2015-03-09 NOTE — Assessment & Plan Note (Signed)
stable overall by history and exam, recent data reviewed with pt, and pt to continue medical treatment as before,  to f/u any worsening symptoms or concerns, for f/u ammonia level, consider trial d/c lactulose

## 2015-03-09 NOTE — Assessment & Plan Note (Signed)
Improved, now stable on current regimen,  to f/u any worsening symptoms or concerns

## 2015-03-09 NOTE — Progress Notes (Signed)
Pre visit review using our clinic review tool, if applicable. No additional management support is needed unless otherwise documented below in the visit note. 

## 2015-03-10 ENCOUNTER — Ambulatory Visit: Payer: Medicare Other

## 2015-03-10 ENCOUNTER — Ambulatory Visit (HOSPITAL_BASED_OUTPATIENT_CLINIC_OR_DEPARTMENT_OTHER): Payer: Medicare Other | Admitting: Hematology

## 2015-03-10 ENCOUNTER — Encounter: Payer: Self-pay | Admitting: Hematology

## 2015-03-10 ENCOUNTER — Telehealth: Payer: Self-pay | Admitting: Hematology

## 2015-03-10 VITALS — BP 203/86 | HR 67 | Temp 98.2°F | Resp 18 | Ht 63.0 in | Wt 124.3 lb

## 2015-03-10 DIAGNOSIS — C220 Liver cell carcinoma: Secondary | ICD-10-CM

## 2015-03-10 DIAGNOSIS — K7030 Alcoholic cirrhosis of liver without ascites: Secondary | ICD-10-CM

## 2015-03-10 NOTE — [facility] Oncology Navigator Note (Signed)
Met with patient, her son, daughter and niece during new patient visit. Explained the role of the GI Nurse Navigator and provided New Patient Packet with information on: 1.  [place] cancer 2. Support groups 3. Advanced Directives 4. Fall Safety Plan Answered questions, reviewed current treatment plan using TEACH back and provided emotional support. Provided copy of current treatment plan.  Merceda Elks, RN, BSN GI Oncology [place]

## 2015-03-10 NOTE — Progress Notes (Signed)
Checked in new pt with no financial concerns at this time.  Pt has 2 insurances so financial assistance may not be needed but she has my card for any billing questions or concerns.  ° °

## 2015-03-10 NOTE — Telephone Encounter (Signed)
Gave avs. Will call patient with referral once added.

## 2015-03-11 ENCOUNTER — Ambulatory Visit: Payer: Medicare Other | Admitting: Hematology

## 2015-03-11 ENCOUNTER — Ambulatory Visit: Payer: Medicare Other

## 2015-03-11 LAB — PTH-RELATED PEPTIDE: PTH-related peptide: <0.74 pmol/L

## 2015-03-15 ENCOUNTER — Telehealth: Payer: Self-pay | Admitting: Hematology

## 2015-03-15 NOTE — Telephone Encounter (Signed)
Faxed pt medical records to [facility]. °

## 2015-03-22 ENCOUNTER — Other Ambulatory Visit: Payer: Self-pay | Admitting: General Surgery

## 2015-03-22 DIAGNOSIS — C220 Liver cell carcinoma: Secondary | ICD-10-CM | POA: Diagnosis not present

## 2015-03-22 NOTE — Addendum Note (Signed)
Addended by: Stark Klein on: 03/22/2015 11:43 PM   Modules accepted: Orders

## 2015-03-23 ENCOUNTER — Other Ambulatory Visit: Payer: Self-pay

## 2015-03-23 DIAGNOSIS — C220 Liver cell carcinoma: Secondary | ICD-10-CM

## 2015-03-30 ENCOUNTER — Encounter: Payer: Self-pay | Admitting: *Deleted

## 2015-04-01 ENCOUNTER — Ambulatory Visit
Admission: RE | Admit: 2015-04-01 | Discharge: 2015-04-01 | Disposition: A | Discharge status: Home / Self Care | Payer: Medicare Other | Source: Ambulatory Visit | Attending: General Surgery | Admitting: General Surgery

## 2015-04-01 DIAGNOSIS — C220 Liver cell carcinoma: Secondary | ICD-10-CM | POA: Diagnosis not present

## 2015-04-01 NOTE — Consult Note (Signed)
Chief Complaint: Consult for ablation of hepatocellular carcinoma.  Referring Physician(s): Byerly,Faera  History of Present Illness: Sarah Harmon is a 69 y.o. female with a history of alcoholism and associated cirrhosis who was recently hospitalized between 03/04/2015 and 03/07/2015. She was found to be hyponatremic at that time and was also treated for significant hypertension, hypercalcemia, encephalopathy and acute on chronic kidney disease. Workup included an ultrasound of the abdomen on 02/28/2015 that demonstrated a suspicious lesion in the right lobe of the liver as well as a complex cystic lesion of the left kidney. These were further evaluated by MRI with Eovist contrast. This demonstrated a 1.5 cm hyperenhancing lesion within segment VIII of the liver suspicious for hepatocellular carcinoma in the setting of cirrhosis. A subtle 1.0 cm T2 bright lesion at the hepatic dome did not demonstrate abnormal enhancement. This lesion is felt to be indeterminate at this time.  After discharge from the hospital, the patient has been seen by Drs. Malachy Mood and Almond Lint.  She has an appointment with the [facility] Hepatology clinic on 4/19.  Dr. Donell Beers saw Sarah Harmon on 4/8 and considered her a marginal candidate for surgical resection at this point. She advised a strengthening program. The patient is scheduled to be discussed at the GI multidisciplinary tumor board next week.  The patient has stopped using alcohol for the last 4 weeks. Mental status and ammonia levels have improved with lactulose. Hepatitis panel is negative. The patient states decreased appetite and some weight loss since March. She is unsure how much weight she has lost. She denies any history of hematemesis, melena or prior GI bleed.  Past Medical History  Diagnosis Date  . ALCOHOL ABUSE 07/13/2010  . ANXIETY 12/22/2009  . DEPRESSION 12/22/2009  . 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 FATIGUE 12/23/2010   . GALLSTONES 12/22/2009  . HYPERLIPIDEMIA 12/22/2009  . HYPERTENSION 12/22/2009  . HYPONATREMIA 12/22/2009  . 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 OSTEOPENIA 12/22/2009  . Pancytopenia 07/13/2010  . RENAL CYST, LEFT 12/22/2009  . VITAMIN D DEFICIENCY 12/23/2010  . WEIGHT LOSS 12/23/2010  . DJD (degenerative joint disease), cervical   . Renal cyst   . Gallstones     Past Surgical History  Procedure Laterality Date  . Abdominal hysterectomy      Allergies: Review of patient's allergies indicates no known allergies.  Medications: Prior to Admission medications   Medication Sig Start Date End Date Taking? Authorizing Provider  alendronate (FOSAMAX) 70 MG tablet Take 1 tablet (70 mg total) by mouth every 7 (seven) days. Take with a full glass of water on an empty stomach. Patient taking differently: Take 70 mg by mouth every 7 (seven) days. Take with a full glass of water on an empty stomach. Take every Wednesday 02/26/15  Yes Corwin Levins, MD  ALPRAZolam Prudy Feeler) 0.5 MG tablet TAKE ONE TABLET BY MOUTH AT BEDTIME AS NEEDED Patient taking differently: Take 0.5 mg by mouth at bedtime.  02/26/15  Yes Corwin Levins, MD  amLODipine (NORVASC) 5 MG tablet Take 2 tablets (10 mg total) by mouth daily. 03/02/15 03/01/16 Yes Drema Dallas, MD  citalopram (CELEXA) 20 MG tablet Take 0.5 tablets (10 mg total) by mouth daily. 03/02/15  Yes Drema Dallas, MD  folic acid (FOLVITE) 1 MG tablet TAKE ONE TABLET BY MOUTH ONCE DAILY 03/11/14  Yes Corwin Levins, MD  hydrALAZINE (APRESOLINE) 100 MG tablet Take 1 tablet (100 mg total) by mouth 3 (three)  times daily. 03/07/15  Yes Cherene Altes, MD  ibuprofen (ADVIL,MOTRIN) 200 MG tablet Take 1-2 tablets (200-400 mg total) by mouth every 6 (six) hours as needed for fever, headache, mild pain or moderate pain. 03/07/15  Yes Cherene Altes, MD  isosorbide dinitrate (ISORDIL) 10 MG tablet Take 1 tablet (10 mg total) by mouth 3 (three) times daily with meals. 03/07/15  Yes Cherene Altes, MD  ketotifen (ZADITOR) 0.025 % ophthalmic solution Place 1 drop into both eyes 2 (two) times daily.   Yes Historical Provider, MD  lactulose (CHRONULAC) 10 GM/15ML solution Take 30 mLs (20 g total) by mouth daily. 03/07/15  Yes Cherene Altes, MD  Multiple Vitamin (MULTIVITAMIN) tablet Take 1 tablet by mouth daily.     Yes Historical Provider, MD  Polyethyl Glycol-Propyl Glycol (SYSTANE OP) Place 1 drop into both eyes daily.   Yes Historical Provider, MD  propranolol (INDERAL) 20 MG tablet TAKE ONE TABLET BY MOUTH TWICE DAILY 09/22/14  Yes Biagio Borg, MD  Cholecalciferol (VITAMIN D3) 1000 UNITS CAPS Take 1 capsule by mouth daily.     Historical Provider, MD    Family History  Problem Relation Age of Onset  . Cancer Sister     breast cancer   . Cancer Brother     lung   . Cancer Sister     gastric cancer   . Cancer Sister     lung cancer  . Cancer Other     breast cancer     History   Social History  . Marital Status: Widowed    Spouse Name: N/A  . Number of Children: N/A  . Years of Education: N/A   Social History Main Topics  . Smoking status: Never Smoker   . Smokeless tobacco: Not on file  . Alcohol Use: 1.8 - 2.4 oz/week    3-4 Cans of beer per week     Comment: she quit a few times but back to drinking agian   . Drug Use: No  . Sexual Activity: Not on file   Other Topics Concern  . None   Social History Narrative   Widowed, grown son lives with her   Retired from Manufacturing engineer   Has total of #5 children   Enjoys watching TV and planting flowers    Review of Systems: A 12 point ROS discussed and pertinent positives are indicated in the HPI above.  All other systems are negative.  Review of Systems  Constitutional: Positive for appetite change and unexpected weight change. Negative for fever, chills, diaphoresis and fatigue.  HENT: Negative.   Respiratory: Negative.   Cardiovascular: Negative.   Gastrointestinal: Negative.     Genitourinary: Negative.   Musculoskeletal: Negative.   Skin: Negative.   Neurological: Negative.   Hematological: Negative.      Vital Signs: BP 155/76 mmHg  Pulse 82  Temp(Src) 98.7 F (37.1 C) (Oral)  Resp 14  Ht $R'5\' 3"'lN$  (1.6 m)  Wt 124 lb (56.246 kg)  BMI 21.97 kg/m2  SpO2 100%  Physical Exam  Constitutional: She is oriented to person, place, and time. No distress.  Neck: Normal range of motion. No JVD present. No thyromegaly present.  Cardiovascular: Normal rate and regular rhythm.  Exam reveals no gallop and no friction rub.   Murmur heard. II/VI systolic ejection murmur  Pulmonary/Chest: Effort normal and breath sounds normal. No respiratory distress. She has no wheezes. She has no rales. She exhibits no tenderness.  Abdominal: Soft. Bowel sounds are normal. She exhibits no distension and no mass. There is no tenderness. There is no rebound and no guarding.  Musculoskeletal: Normal range of motion. She exhibits no edema or tenderness.  Lymphadenopathy:    She has no cervical adenopathy.  Neurological: She is alert and oriented to person, place, and time.  Skin: Skin is warm and dry. She is not diaphoretic.  Nursing note and vitals reviewed.   Imaging: Ct Head Wo Contrast  03/04/2015   CLINICAL DATA:  Syncopal episode, no fall. Found sitting up in a chair unconscious.  EXAM: CT HEAD WITHOUT CONTRAST  TECHNIQUE: Contiguous axial images were obtained from the base of the skull through the vertex without intravenous contrast.  COMPARISON:  06/04/2010  FINDINGS: There is no evidence of mass effect, midline shift, or extra-axial fluid collections. There is no evidence of a space-occupying lesion or intracranial hemorrhage. There is no evidence of a cortical-based area of acute infarction. There is generalized cerebral atrophy. There is periventricular white matter low attenuation likely secondary to microangiopathy.  The ventricles and sulci are appropriate for the patient's age.  The basal cisterns are patent.  Visualized portions of the orbits are unremarkable. The visualized portions of the paranasal sinuses and mastoid air cells are unremarkable. Cerebrovascular atherosclerotic calcifications are noted.  The osseous structures are unremarkable.  IMPRESSION: 1. No acute intracranial pathology. 2. Chronic microvascular disease and cerebral atrophy.   Electronically Signed   By: Kathreen Devoid   On: 03/04/2015 20:20   Ct Angio Chest Pe W/cm &/or Wo Cm  03/06/2015   CLINICAL DATA:  69 year old female with elevated D-dimer. Hypertension, sinus bradycardia and hepatic cellular carcinoma.  EXAM: CT ANGIOGRAPHY CHEST WITH CONTRAST  TECHNIQUE: Multidetector CT imaging of the chest was performed using the standard protocol during bolus administration of intravenous contrast. Multiplanar CT image reconstructions and MIPs were obtained to evaluate the vascular anatomy.  CONTRAST:  50mL OMNIPAQUE IOHEXOL 350 MG/ML SOLN  COMPARISON:  02/04/2008 CT.  02/27/2015 and prior chest radiographs  FINDINGS: This is a technically adequate study although respiratory motion artifact decreases sensitivity in several portions of the lungs.  Mediastinum/Nodes: No definite pulmonary emboli are identified. 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  There is no evidence of thoracic aortic aneurysm. No pleural or pericardial effusions identified.  Lungs/Pleura: A 3 mm left apical nodule is unchanged from 2009. No airspace disease, suspicious nodule, mass or consolidation identified.  Upper abdomen: Cirrhosis and cholelithiasis again noted.  Musculoskeletal: No acute or suspicious abnormalities noted.  Review of the MIP images confirms the above findings.  IMPRESSION: No evidence of acute abnormality.  No evidence of pulmonary emboli.  Cirrhosis and cholelithiasis.  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    Electronically Signed   By: Margarette Canada M.D.   On: 03/06/2015 19:21   Mr Brain Wo Contrast  03/05/2015   CLINICAL DATA:  Syncope.  EXAM: MRI HEAD WITHOUT CONTRAST  TECHNIQUE: Multiplanar, multiecho pulse sequences of the brain and surrounding structures were obtained without intravenous contrast.  COMPARISON:  Head CT 03/04/2015 and MRI 02/03/2008  FINDINGS: There is no evidence of acute infarct, mass, midline shift, or extra-axial fluid collection. A few small foci of chronic microhemorrhage are noted in the left thalamus and right parietal lobe. Patchy and confluent T2 hyperintensities in the periventricular greater than subcortical cerebral white matter bilaterally are nonspecific but compatible with moderate  chronic small vessel ischemic disease, increased from the prior MRI. Mild generalized cerebral atrophy is within normal limits for age. Prominent ossification is again seen along the falx. Chronic lacunar infarcts are noted in the thalami.  Orbits are unremarkable. Mild posterior right ethmoid air cell mucosal thickening is noted. Mastoid air cells are clear. Abnormal distal left vertebral artery flow void is unchanged.  IMPRESSION: 1. No acute intracranial abnormality. 2. Moderate chronic small vessel ischemic disease.   Electronically Signed   By: Logan Bores   On: 03/05/2015 08:46    Labs:  CBC:  Recent Labs  03/04/15 1952 03/05/15 0611 03/06/15 0414 03/07/15 0405  WBC 3.4* 3.1* 4.1 4.7  HGB 13.1 12.2 11.7* 12.1  HCT 37.9 35.3* 34.1* 35.2*  PLT 127* 146* 161 188    COAGS:  Recent Labs  02/27/15 1915 02/28/15 0335 03/01/15 0352  INR 1.14 1.04 1.11    BMP:  Recent Labs  03/04/15 1952 03/05/15 0611 03/06/15 0414 03/07/15 0405 03/09/15 1155  NA 131* 130* 132* 135 126*  K 4.3 4.0 3.5 3.2* 4.4  CL 95* 92* 97 103 95*  CO2 $Re'28 30 25 23 27  'UjD$ GLUCOSE 89 96 93 98 94  BUN $Re'10 10 7 'gwg$ <5* 14  CALCIUM 10.6* 11.0* 10.3 9.6 9.9  CREATININE 1.93* 1.56* 1.35* 1.16* 1.69*    GFRNONAA 25* 33* 39* 47*  --   GFRAA 29* 38* 45* 54*  --     LIVER FUNCTION TESTS:  Recent Labs  03/04/15 1952 03/05/15 0611 03/06/15 0414 03/07/15 0405  BILITOT 0.4 0.7 0.5 0.8  AST 43* 30 32 29  ALT $Re'26 21 20 18  'xOh$ ALKPHOS 45 41 42 40  PROT 8.2 7.9 6.8 7.3  ALBUMIN 3.4* 3.1* 2.9* 3.1*    TUMOR MARKERS:  Recent Labs  02/27/15 2220  AFPTM 8.4*    Assessment and Plan:  I met with Sarah Harmon and her daughter Sarah Harmon.  We reviewed imaging findings and discussed treatment options for the enhancing 1.5 cm liver lesion. In the setting of cirrhosis with mildly elevated AFP, imaging characteristics are nearly definitive for a stage I hepatocellular carcinoma. There is no evidence of metastatic disease. The 1 cm dome lesion is currently indeterminate. Dr. Barry Dienes has discussed the possibility of right hepatectomy to remove both lesions. I discussed the possibility of percutaneous thermal ablation under CT guidance to treat the 1.5 cm enhancing segment VIII lesion. The lesion is amenable based on location and size for thermal ablation with a high likelihood of complete ablation with one treatment. The smaller lesion at the dome of the liver would be much more difficult to technically ablate.  I reviewed details of percutaneous ablation with Sarah Harmon and her daughter. We reviewed technical details as well as risks of the procedure.  I told her that she is a good candidate for ablation of the suspicious lesion.  I told Mrs. Dimaria that her case will be discussed at GI tumor board and a consensus reached regarding treatment of the liver. She will also keep her appointment with Roosevelt Locks, CRNP at the [facility] clinic next week.  Thank you for this interesting consult.  I greatly enjoyed meeting Sarah Harmon and look forward to participating in their care.  SignedAletta Edouard T 04/01/2015, 9:33 AM     I spent a total of 40 minutes face to face in clinical  consultation, greater than 50% of which was counseling/coordinating care for treatment of hepatocellular carcinoma.

## 2015-04-06 DIAGNOSIS — C220 Liver cell carcinoma: Secondary | ICD-10-CM | POA: Diagnosis not present

## 2015-04-06 DIAGNOSIS — K7030 Alcoholic cirrhosis of liver without ascites: Secondary | ICD-10-CM | POA: Diagnosis not present

## 2015-04-09 ENCOUNTER — Telehealth: Payer: Self-pay | Admitting: *Deleted

## 2015-04-09 NOTE — Telephone Encounter (Signed)
Unable to reach patient by phone to F/U on her Liver Clinic visit and if her ablation procedure has been set up yet. Left message with Dr. Kathlene Cote about setting up procedure.

## 2015-04-14 ENCOUNTER — Encounter: Payer: Self-pay | Admitting: *Deleted

## 2015-04-23 ENCOUNTER — Other Ambulatory Visit: Payer: Self-pay | Admitting: Interventional Radiology

## 2015-04-23 DIAGNOSIS — C220 Liver cell carcinoma: Secondary | ICD-10-CM

## 2015-04-27 ENCOUNTER — Telehealth: Payer: Self-pay | Admitting: Internal Medicine

## 2015-04-27 NOTE — Telephone Encounter (Signed)
Pt daughter called in and had some question about meds that pt received from the hosp. She wanted to know if she was suppose to keep taking them or stop.    It was bp pressure meds and other but she was not sure of the name.  She said that there were no more refills on them.     Best number [phone]

## 2015-04-27 NOTE — Telephone Encounter (Signed)
Called daughter back she wasn't sure what the name of the medications. Per chart pt saw md to hosp f/u 03/09/15 and he didn't change any medications. Advise daughter since she was aware of name of meds when she get home even though the bottles says no refills to go ahead and request a refill that way we can know exactly what she is needing...Johny Chess

## 2015-05-03 ENCOUNTER — Telehealth: Payer: Self-pay | Admitting: Internal Medicine

## 2015-05-03 MED ORDER — HYDRALAZINE HCL 100 MG PO TABS: 100.0000 mg | ORAL_TABLET | Freq: Three times a day (TID) | ORAL | Status: DC

## 2015-05-03 MED ORDER — ISOSORBIDE DINITRATE 10 MG PO TABS: 10.0000 mg | ORAL_TABLET | Freq: Three times a day (TID) | ORAL | Status: DC

## 2015-05-03 NOTE — Telephone Encounter (Signed)
Please call patient's daughter at [phone] regarding her blood pressure medicine. She doesn't know the name of them. She states the pharmacy has trying send them in and haven't heard back from [place]. Patient's blood pressure is running high

## 2015-05-03 NOTE — Telephone Encounter (Signed)
Notified daughter (laverne) with md response...Johny Chess

## 2015-05-03 NOTE — Telephone Encounter (Signed)
Spoke to dtr. Reordered the ED meds that were started.  Dtr has a question about whether or not the amlodipine should be d/c'ed.

## 2015-05-03 NOTE — Telephone Encounter (Signed)
Yes, it appears from the chart that patient should be taking total of 10 mg norvasc per day, in addition to all other meds as per last visit mar 22 with me

## 2015-05-05 ENCOUNTER — Other Ambulatory Visit: Payer: Self-pay | Admitting: Physician Assistant

## 2015-05-05 NOTE — Progress Notes (Signed)
Called for orders surgery 05-14-15 pre op 05-10-15 Thanks

## 2015-05-07 ENCOUNTER — Encounter (HOSPITAL_COMMUNITY): Payer: Self-pay

## 2015-05-07 NOTE — Patient Instructions (Signed)
RAECHEL DUNKLEBERGER  05/07/2015   Your procedure is scheduled on:   05/14/2015     Report to [facility] Main  Entrance and follow signs to              Radiology  At    0630 AM  Check in with Radiology.  . Then you will go to Short Stay where the nurses will get you ready for procedure.   Call this number if you have problems the morning of surgery [phone]   Remember: ONLY 1 PERSON MAY GO WITH YOU TO SHORT STAY TO GET  READY MORNING OF YOUR SURGERY.  Do not eat food or drink liquids :After Midnight.     Take these medicines the morning of surgery with A SIP OF WATER:   Amlodipine ( Norvaswc), Celexa, Hydralazine ( Apresoline), Isosobide Dinitrate ( Isordil), Zaditor eye drops, Propanolol ( Inderal ).                                You may not have any metal on your body including hair pins and              piercings  Do not wear jewelry, make-up, lotions, powders or perfumes, deodorant             Do not wear nail polish.  Do not shave  48 hours prior to surgery.     Do not bring valuables to the hospital. [place].  Contacts, dentures or bridgework may not be worn into surgery.  Leave suitcase in the car. After surgery it may be brought to your room.     Special Instructions: coughing and deep breathing exercises, leg exercises               Please read over the following fact sheets you were given: _____________________________________________________________________             [employer] - Preparing for Surgery Before surgery, you can play an important role.  Because skin is not sterile, your skin needs to be as free of germs as possible.  You can reduce the number of germs on your skin by washing with CHG (chlorahexidine gluconate) soap before surgery.  CHG is an antiseptic cleaner which kills germs and bonds with the skin to continue killing germs even after washing. Please DO NOT use if you have an  allergy to CHG or antibacterial soaps.  If your skin becomes reddened/irritated stop using the CHG and inform your nurse when you arrive at Short Stay. Do not shave (including legs and underarms) for at least 48 hours prior to the first CHG shower.  You may shave your face/neck. Please follow these instructions carefully:  1.  Shower with CHG Soap the night before surgery and the  morning of Surgery.  2.  If you choose to wash your hair, wash your hair first as usual with your  normal  shampoo.  3.  After you shampoo, rinse your hair and body thoroughly to remove the  shampoo.                           4.  Use CHG as you would any other liquid  soap.  You can apply chg directly  to the skin and wash                       Gently with a scrungie or clean washcloth.  5.  Apply the CHG Soap to your body ONLY FROM THE NECK DOWN.   Do not use on face/ open                           Wound or open sores. Avoid contact with eyes, ears mouth and genitals (private parts).                       Wash face,  Genitals (private parts) with your normal soap.             6.  Wash thoroughly, paying special attention to the area where your surgery  will be performed.  7.  Thoroughly rinse your body with warm water from the neck down.  8.  DO NOT shower/wash with your normal soap after using and rinsing off  the CHG Soap.                9.  Pat yourself dry with a clean towel.            10.  Wear clean pajamas.            11.  Place clean sheets on your bed the night of your first shower and do not  sleep with pets. Day of Surgery : Do not apply any lotions/deodorants the morning of surgery.  Please wear clean clothes to the hospital/surgery center.  FAILURE TO FOLLOW THESE INSTRUCTIONS MAY RESULT IN THE CANCELLATION OF YOUR SURGERY PATIENT SIGNATURE_________________________________  NURSE SIGNATURE__________________________________  ________________________________________________________________________

## 2015-05-10 ENCOUNTER — Encounter (HOSPITAL_COMMUNITY): Payer: Self-pay

## 2015-05-10 ENCOUNTER — Encounter (HOSPITAL_COMMUNITY)
Admission: RE | Admit: 2015-05-10 | Discharge: 2015-05-10 | Disposition: A | Discharge status: Home / Self Care | Payer: Medicare Other | Source: Ambulatory Visit | Attending: Interventional Radiology | Admitting: Interventional Radiology

## 2015-05-10 ENCOUNTER — Other Ambulatory Visit: Payer: Self-pay | Admitting: Radiology

## 2015-05-10 DIAGNOSIS — Z01812 Encounter for preprocedural laboratory examination: Secondary | ICD-10-CM | POA: Insufficient documentation

## 2015-05-10 DIAGNOSIS — K769 Liver disease, unspecified: Secondary | ICD-10-CM | POA: Diagnosis not present

## 2015-05-10 HISTORY — DX: Headache: R51

## 2015-05-10 HISTORY — DX: Headache, unspecified: R51.9

## 2015-05-10 LAB — COMPREHENSIVE METABOLIC PANEL
ALBUMIN: 3.8 g/dL (ref 3.5–5.0)
ALK PHOS: 55 U/L (ref 38–126)
ALT: 15 U/L (ref 14–54)
AST: 31 U/L (ref 15–41)
Anion gap: 9 (ref 5–15)
BUN: 16 mg/dL (ref 6–20)
CALCIUM: 10.2 mg/dL (ref 8.9–10.3)
CO2: 29 mmol/L (ref 22–32)
Chloride: 100 mmol/L — ABNORMAL LOW (ref 101–111)
Creatinine, Ser: 1.46 mg/dL — ABNORMAL HIGH (ref 0.44–1.00)
GFR calc Af Amer: 41 mL/min — ABNORMAL LOW (ref >60)
GFR calc non Af Amer: 35 mL/min — ABNORMAL LOW (ref >60)
GLUCOSE: 139 mg/dL — AB (ref 65–99)
Potassium: 4.8 mmol/L (ref 3.5–5.1)
Sodium: 138 mmol/L (ref 135–145)
Total Bilirubin: 0.3 mg/dL (ref 0.3–1.2)
Total Protein: 8.6 g/dL — ABNORMAL HIGH (ref 6.5–8.1)

## 2015-05-10 LAB — APTT: aPTT: 33 seconds (ref 24–37)

## 2015-05-10 LAB — PROTIME-INR
INR: 1.06 (ref 0.00–1.49)
PROTHROMBIN TIME: 14 s (ref 11.6–15.2)

## 2015-05-10 LAB — CBC
HEMATOCRIT: 35.5 % — AB (ref 36.0–46.0)
HEMOGLOBIN: 11.4 g/dL — AB (ref 12.0–15.0)
MCH: 29.7 pg (ref 26.0–34.0)
MCHC: 32.1 g/dL (ref 30.0–36.0)
MCV: 92.4 fL (ref 78.0–100.0)
PLATELETS: 186 10*3/uL (ref 150–400)
RBC: 3.84 MIL/uL — AB (ref 3.87–5.11)
RDW: 14 % (ref 11.5–15.5)
WBC: 5.3 10*3/uL (ref 4.0–10.5)

## 2015-05-10 NOTE — Progress Notes (Signed)
EKG- 03/04/15 EPIC  ECHO- 03/01/15 EPIC  03/06/15 CT angio chest- EPIC

## 2015-05-10 NOTE — Progress Notes (Signed)
CMP results done 05/10/15 faxed via EPIC to Dr Kathlene Cote.

## 2015-05-13 ENCOUNTER — Other Ambulatory Visit: Payer: Self-pay | Admitting: Radiology

## 2015-05-14 ENCOUNTER — Observation Stay (HOSPITAL_COMMUNITY)
Admission: RE | Admit: 2015-05-14 | Discharge: 2015-05-15 | Disposition: A | Discharge status: Home / Self Care | Payer: Medicare Other | Source: Ambulatory Visit | Attending: Interventional Radiology | Admitting: Interventional Radiology

## 2015-05-14 ENCOUNTER — Encounter (HOSPITAL_COMMUNITY): Payer: Self-pay

## 2015-05-14 ENCOUNTER — Ambulatory Visit (HOSPITAL_COMMUNITY)
Admission: RE | Admit: 2015-05-14 | Discharge: 2015-05-14 | Disposition: A | Discharge status: Home / Self Care | Payer: Medicare Other | Source: Ambulatory Visit | Attending: Interventional Radiology | Admitting: Interventional Radiology

## 2015-05-14 ENCOUNTER — Ambulatory Visit (HOSPITAL_COMMUNITY): Payer: Medicare Other | Admitting: Anesthesiology

## 2015-05-14 ENCOUNTER — Encounter (HOSPITAL_COMMUNITY)
Admission: RE | Disposition: A | Discharge status: Home / Self Care | Payer: Self-pay | Source: Ambulatory Visit | Attending: Interventional Radiology

## 2015-05-14 ENCOUNTER — Encounter (HOSPITAL_COMMUNITY): Payer: Self-pay | Admitting: Anesthesiology

## 2015-05-14 DIAGNOSIS — I10 Essential (primary) hypertension: Secondary | ICD-10-CM | POA: Diagnosis not present

## 2015-05-14 DIAGNOSIS — K7030 Alcoholic cirrhosis of liver without ascites: Secondary | ICD-10-CM | POA: Insufficient documentation

## 2015-05-14 DIAGNOSIS — C220 Liver cell carcinoma: Principal | ICD-10-CM | POA: Insufficient documentation

## 2015-05-14 DIAGNOSIS — K769 Liver disease, unspecified: Secondary | ICD-10-CM | POA: Diagnosis not present

## 2015-05-14 DIAGNOSIS — F1020 Alcohol dependence, uncomplicated: Secondary | ICD-10-CM | POA: Insufficient documentation

## 2015-05-14 DIAGNOSIS — K7689 Other specified diseases of liver: Secondary | ICD-10-CM | POA: Diagnosis not present

## 2015-05-14 DIAGNOSIS — D649 Anemia, unspecified: Secondary | ICD-10-CM | POA: Diagnosis not present

## 2015-05-14 DIAGNOSIS — K7460 Unspecified cirrhosis of liver: Secondary | ICD-10-CM | POA: Diagnosis not present

## 2015-05-14 LAB — ABO/RH: ABO/RH(D): O POS

## 2015-05-14 LAB — TYPE AND SCREEN
ABO/RH(D): O POS
Antibody Screen: NEGATIVE

## 2015-05-14 SURGERY — RADIO FREQUENCY ABLATION
Anesthesia: General

## 2015-05-14 MED ORDER — PROMETHAZINE HCL 25 MG/ML IJ SOLN: 6.2500 mg | INTRAMUSCULAR | Status: DC | PRN

## 2015-05-14 MED ORDER — MIDAZOLAM HCL 2 MG/2ML IJ SOLN
INTRAMUSCULAR | Status: AC
  Filled 2015-05-14: qty 2

## 2015-05-14 MED ORDER — NEOSTIGMINE METHYLSULFATE 10 MG/10ML IV SOLN
INTRAVENOUS | Status: DC | PRN
  Administered 2015-05-14: 2.5 mg via INTRAVENOUS

## 2015-05-14 MED ORDER — KETOTIFEN FUMARATE 0.025 % OP SOLN
1.0000 [drp] | Freq: Two times a day (BID) | OPHTHALMIC | Status: DC
  Administered 2015-05-14 – 2015-05-15 (×2): 1 [drp] via OPHTHALMIC
  Filled 2015-05-14: qty 5

## 2015-05-14 MED ORDER — ISOSORBIDE DINITRATE 10 MG PO TABS
10.0000 mg | ORAL_TABLET | Freq: Three times a day (TID) | ORAL | Status: DC
  Administered 2015-05-14 – 2015-05-15 (×3): 10 mg via ORAL
  Filled 2015-05-14 (×6): qty 1

## 2015-05-14 MED ORDER — SUCCINYLCHOLINE CHLORIDE 20 MG/ML IJ SOLN
INTRAMUSCULAR | Status: DC | PRN
  Administered 2015-05-14: 60 mg via INTRAVENOUS

## 2015-05-14 MED ORDER — HYDRALAZINE HCL 50 MG PO TABS
100.0000 mg | ORAL_TABLET | Freq: Three times a day (TID) | ORAL | Status: DC
  Administered 2015-05-14 – 2015-05-15 (×3): 100 mg via ORAL
  Filled 2015-05-14 (×5): qty 2

## 2015-05-14 MED ORDER — FENTANYL CITRATE (PF) 100 MCG/2ML IJ SOLN
INTRAMUSCULAR | Status: AC
  Filled 2015-05-14: qty 2

## 2015-05-14 MED ORDER — PIPERACILLIN-TAZOBACTAM 3.375 G IVPB
3.3750 g | Freq: Once | INTRAVENOUS | Status: AC
  Administered 2015-05-14: 3.375 g via INTRAVENOUS
  Filled 2015-05-14 (×2): qty 50

## 2015-05-14 MED ORDER — HYDRALAZINE HCL 20 MG/ML IJ SOLN
INTRAMUSCULAR | Status: AC
  Filled 2015-05-14: qty 1

## 2015-05-14 MED ORDER — SODIUM CHLORIDE 0.9 % IV SOLN
INTRAVENOUS | Status: DC
  Administered 2015-05-14: 14:00:00 via INTRAVENOUS

## 2015-05-14 MED ORDER — LACTULOSE 10 GM/15ML PO SOLN
20.0000 g | Freq: Every day | ORAL | Status: DC
  Administered 2015-05-14 – 2015-05-15 (×2): 20 g via ORAL
  Filled 2015-05-14 (×2): qty 30

## 2015-05-14 MED ORDER — EPHEDRINE SULFATE 50 MG/ML IJ SOLN
INTRAMUSCULAR | Status: DC | PRN
  Administered 2015-05-14: 10 mg via INTRAVENOUS

## 2015-05-14 MED ORDER — FENTANYL CITRATE (PF) 250 MCG/5ML IJ SOLN
INTRAMUSCULAR | Status: AC
  Filled 2015-05-14: qty 5

## 2015-05-14 MED ORDER — LACTATED RINGERS IV SOLN
INTRAVENOUS | Status: DC
  Administered 2015-05-14: 07:00:00 via INTRAVENOUS

## 2015-05-14 MED ORDER — PROPRANOLOL HCL 20 MG PO TABS
20.0000 mg | ORAL_TABLET | Freq: Two times a day (BID) | ORAL | Status: DC
  Administered 2015-05-14 – 2015-05-15 (×2): 20 mg via ORAL
  Filled 2015-05-14 (×3): qty 1

## 2015-05-14 MED ORDER — HYDROCODONE-ACETAMINOPHEN 5-325 MG PO TABS: 1.0000 | ORAL_TABLET | ORAL | Status: DC | PRN

## 2015-05-14 MED ORDER — CISATRACURIUM BESYLATE (PF) 10 MG/5ML IV SOLN
INTRAVENOUS | Status: DC | PRN
  Administered 2015-05-14: 3 mg via INTRAVENOUS
  Administered 2015-05-14: 10 mg via INTRAVENOUS
  Administered 2015-05-14: 2 mg via INTRAVENOUS

## 2015-05-14 MED ORDER — ONDANSETRON HCL 4 MG/2ML IJ SOLN: 4.0000 mg | Freq: Four times a day (QID) | INTRAMUSCULAR | Status: DC | PRN

## 2015-05-14 MED ORDER — LIDOCAINE HCL (CARDIAC) 20 MG/ML IV SOLN
INTRAVENOUS | Status: DC | PRN
  Administered 2015-05-14: 50 mg via INTRAVENOUS

## 2015-05-14 MED ORDER — HYDROMORPHONE HCL 1 MG/ML IJ SOLN: 0.2500 mg | INTRAMUSCULAR | Status: DC | PRN

## 2015-05-14 MED ORDER — CITALOPRAM HYDROBROMIDE 10 MG PO TABS
10.0000 mg | ORAL_TABLET | Freq: Every day | ORAL | Status: DC
  Administered 2015-05-15: 10 mg via ORAL
  Filled 2015-05-14: qty 1

## 2015-05-14 MED ORDER — HYDRALAZINE HCL 20 MG/ML IJ SOLN
5.0000 mg | Freq: Once | INTRAMUSCULAR | Status: AC
  Administered 2015-05-14: 5 mg via INTRAVENOUS

## 2015-05-14 MED ORDER — GLYCOPYRROLATE 0.2 MG/ML IJ SOLN
INTRAMUSCULAR | Status: DC | PRN
  Administered 2015-05-14: 0.4 mg via INTRAVENOUS

## 2015-05-14 MED ORDER — PROPOFOL 10 MG/ML IV BOLUS
INTRAVENOUS | Status: DC | PRN
  Administered 2015-05-14: 110 mg via INTRAVENOUS

## 2015-05-14 MED ORDER — DOCUSATE SODIUM 100 MG PO CAPS
100.0000 mg | ORAL_CAPSULE | Freq: Two times a day (BID) | ORAL | Status: DC
  Administered 2015-05-14: 100 mg via ORAL
  Filled 2015-05-14 (×3): qty 1

## 2015-05-14 MED ORDER — MIDAZOLAM HCL 5 MG/5ML IJ SOLN
INTRAMUSCULAR | Status: DC | PRN
  Administered 2015-05-14 (×2): 1 mg via INTRAVENOUS

## 2015-05-14 MED ORDER — FENTANYL CITRATE (PF) 100 MCG/2ML IJ SOLN
INTRAMUSCULAR | Status: DC | PRN
  Administered 2015-05-14: 100 ug via INTRAVENOUS
  Administered 2015-05-14: 50 ug via INTRAVENOUS

## 2015-05-14 MED ORDER — POLYVINYL ALCOHOL 1.4 % OP SOLN
1.0000 [drp] | Freq: Every morning | OPHTHALMIC | Status: DC
  Administered 2015-05-15: 1 [drp] via OPHTHALMIC
  Filled 2015-05-14 (×2): qty 15

## 2015-05-14 MED ORDER — AMLODIPINE BESYLATE 10 MG PO TABS
10.0000 mg | ORAL_TABLET | Freq: Every day | ORAL | Status: DC
  Administered 2015-05-15: 10 mg via ORAL
  Filled 2015-05-14: qty 1

## 2015-05-14 MED ORDER — SENNOSIDES-DOCUSATE SODIUM 8.6-50 MG PO TABS
1.0000 | ORAL_TABLET | Freq: Every day | ORAL | Status: DC | PRN
  Filled 2015-05-14: qty 1

## 2015-05-14 MED ORDER — IOHEXOL 300 MG/ML  SOLN
100.0000 mL | Freq: Once | INTRAMUSCULAR | Status: AC | PRN
  Administered 2015-05-14: 75 mL via INTRAVENOUS

## 2015-05-14 NOTE — Procedures (Signed)
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 Microwave thermal ablation performed with single NeuWave PR probe.  7 min cycle of ablation at 65W.  Complications:  None EBL:  < 25 mL  Plan:  PACU recovery followed by overnight observation.  Venetia Night. Kathlene Cote, M.D Pager:  [phone]

## 2015-05-14 NOTE — Anesthesia Procedure Notes (Signed)
Procedure Name: Intubation Date/Time: 05/14/2015 8:44 AM Performed by: Danley Danker L Patient Re-evaluated:Patient Re-evaluated prior to inductionOxygen Delivery Method: Circle system utilized Preoxygenation: Pre-oxygenation with 100% oxygen Intubation Type: IV induction Ventilation: Mask ventilation without difficulty and Oral airway inserted - appropriate to patient size Laryngoscope Size: Miller and 3 Grade View: Grade II Tube type: Oral Tube size: 7.5 mm Number of attempts: 1 Airway Equipment and Method: Stylet Placement Confirmation: ETT inserted through vocal cords under direct vision,  positive ETCO2 and breath sounds checked- equal and bilateral Secured at: 20 cm Tube secured with: Tape Dental Injury: Teeth and Oropharynx as per pre-operative assessment

## 2015-05-14 NOTE — Anesthesia Postprocedure Evaluation (Signed)
  Anesthesia Post-op Note  Patient: Sarah Harmon  Procedure(s) Performed: Procedure(s) (LRB): MICROWAVE LIVER  ABLATION (N/A)  Patient Location: PACU  Anesthesia Type: General  Level of Consciousness: awake and alert   Airway and Oxygen Therapy: Patient Spontanous Breathing  Post-op Pain: mild  Post-op Assessment: Post-op Vital signs reviewed, Patient's Cardiovascular Status Stable, Respiratory Function Stable, Patent Airway and No signs of Nausea or vomiting  Last Vitals:  Filed Vitals:   05/14/15 1150  BP: 186/75  Pulse: 64  Temp:   Resp: 22    Post-op Vital Signs: stable   Complications: No apparent anesthesia complications

## 2015-05-14 NOTE — H&P (Signed)
Chief Complaint: Hepatocellular carcinoma  Referring Physician(s): Feng/Byerly  History of Present Illness: Sarah Harmon is a 69 y.o. female with a history of alcoholism and associated cirrhosis who was recently hospitalized between 03/04/2015 and 03/07/2015. She was found to be hyponatremic at that time and was also treated for significant hypertension, hypercalcemia, encephalopathy and acute on chronic kidney disease. Workup included an ultrasound of the abdomen on 02/28/2015 that demonstrated a suspicious lesion in the right lobe of the liver as well as a complex cystic lesion of the left kidney. These were further evaluated by MRI with Eovist contrast. This demonstrated a 1.5 cm hyperenhancing lesion within segment VIII of the liver suspicious for hepatocellular carcinoma in the setting of cirrhosis. A subtle 1.0 cm T2 bright lesion at the hepatic dome did not demonstrate abnormal enhancement. This lesion is felt to be indeterminate at this time. She presents today following recent IR consultation for CT-guided thermal ablation and possible biopsy of the segment VIII liver lesion.  Past Medical History  Diagnosis Date  . ALCOHOL ABUSE 07/13/2010  . ANXIETY 12/22/2009  . DEPRESSION 12/22/2009  . 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 FATIGUE 12/23/2010  . GALLSTONES 12/22/2009  . HYPERLIPIDEMIA 12/22/2009  . HYPERTENSION 12/22/2009  . HYPONATREMIA 12/22/2009  . 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 OSTEOPENIA 12/22/2009  . Pancytopenia 07/13/2010  . VITAMIN D DEFICIENCY 12/23/2010  . WEIGHT LOSS 12/23/2010  . Gallstones   . RENAL CYST, LEFT 12/22/2009  . Renal cyst   . Headache     occasional   . DJD (degenerative joint disease), cervical     patient denies on preop of 05/10/15     Past Surgical History  Procedure Laterality Date  . Abdominal hysterectomy      Allergies: Review of patient's allergies indicates no known allergies.  Medications: Prior to Admission medications     Medication Sig Start Date End Date Taking? Authorizing Provider  alendronate (FOSAMAX) 70 MG tablet Take 1 tablet (70 mg total) by mouth every 7 (seven) days. Take with a full glass of water on an empty stomach. Patient taking differently: Take 70 mg by mouth every Wednesday.  02/26/15  Yes Biagio Borg, MD  ALPRAZolam Duanne Moron) 0.5 MG tablet TAKE ONE TABLET BY MOUTH AT BEDTIME AS NEEDED Patient taking differently: Take 0.5 mg by mouth at bedtime as needed for anxiety or sleep.  02/26/15  Yes Biagio Borg, MD  amLODipine (NORVASC) 5 MG tablet Take 2 tablets (10 mg total) by mouth daily. 03/02/15 03/01/16 Yes Allie Bossier, MD  Cholecalciferol (VITAMIN D3) 1000 UNITS CAPS Take 1 capsule by mouth every morning.    Yes Historical Provider, MD  citalopram (CELEXA) 20 MG tablet Take 0.5 tablets (10 mg total) by mouth daily. 03/02/15  Yes Allie Bossier, MD  folic acid (FOLVITE) 1 MG tablet TAKE ONE TABLET BY MOUTH ONCE DAILY 03/11/14  Yes Biagio Borg, MD  hydrALAZINE (APRESOLINE) 100 MG tablet Take 1 tablet (100 mg total) by mouth 3 (three) times daily. 05/03/15  Yes Biagio Borg, MD  ibuprofen (ADVIL,MOTRIN) 200 MG tablet Take 1-2 tablets (200-400 mg total) by mouth every 6 (six) hours as needed for fever, headache, mild pain or moderate pain. 03/07/15  Yes Cherene Altes, MD  isosorbide dinitrate (ISORDIL) 10 MG tablet Take 1 tablet (10 mg total) by mouth 3 (three) times daily with meals. 05/03/15  Yes Biagio Borg, MD  ketotifen (ZADITOR) 0.025 % ophthalmic solution  Place 1 drop into both eyes 2 (two) times daily.   Yes Historical Provider, MD  lactulose (CHRONULAC) 10 GM/15ML solution Take 30 mLs (20 g total) by mouth daily. 03/07/15  Yes Cherene Altes, MD  Multiple Vitamin (MULTIVITAMIN) tablet Take 1 tablet by mouth every morning.    Yes Historical Provider, MD  Polyethyl Glycol-Propyl Glycol (SYSTANE OP) Place 1 drop into both eyes every morning.    Yes Historical Provider, MD  propranolol (INDERAL)  20 MG tablet TAKE ONE TABLET BY MOUTH TWICE DAILY 09/22/14  Yes Biagio Borg, MD    Family History  Problem Relation Age of Onset  . Cancer Sister     breast cancer   . Cancer Brother     lung   . Cancer Sister     gastric cancer   . Cancer Sister     lung cancer  . Cancer Other     breast cancer     History   Social History  . Marital Status: Widowed    Spouse Name: N/A  . Number of Children: N/A  . Years of Education: N/A   Social History Main Topics  . Smoking status: Never Smoker   . Smokeless tobacco: Never Used  . Alcohol Use: 1.8 - 2.4 oz/week    3-4 Cans of beer per week     Comment: she quit 02/2015   . Drug Use: No  . Sexual Activity: Not on file   Other Topics Concern  . None   Social History Narrative   Widowed, grown son lives with her   Retired from Manufacturing engineer   Has total of #5 children   Enjoys watching TV and planting flowers     Review of Systems  Constitutional: Negative for fever and chills.  Respiratory: Negative for cough and shortness of breath.   Cardiovascular: Negative for chest pain.  Gastrointestinal: Negative for nausea, vomiting, abdominal pain and blood in stool.  Genitourinary: Negative for dysuria and hematuria.  Musculoskeletal: Negative for back pain.  Neurological: Negative for headaches.    Vital Signs: BP 163/73 mmHg  Pulse 72  Temp(Src) 98.3 F (36.8 C) (Oral)  Resp 18  Ht 5\' 3"  (1.6 m)  Wt 126 lb (57.153 kg)  BMI 22.33 kg/m2  SpO2 100%  Physical Exam  Constitutional: She is oriented to person, place, and time. She appears well-developed and well-nourished.  Cardiovascular: Normal rate and regular rhythm.   Pulmonary/Chest: Effort normal and breath sounds normal.  Abdominal: Soft. Bowel sounds are normal. There is no tenderness.  Musculoskeletal: Normal range of motion. She exhibits no edema.  Neurological: She is alert and oriented to person, place, and time.    Imaging: No results  found.  Labs:  CBC:  Recent Labs  03/05/15 0611 03/06/15 0414 03/07/15 0405 05/10/15 0830  WBC 3.1* 4.1 4.7 5.3  HGB 12.2 11.7* 12.1 11.4*  HCT 35.3* 34.1* 35.2* 35.5*  PLT 146* 161 188 186    COAGS:  Recent Labs  02/27/15 1915 02/28/15 0335 03/01/15 0352 05/10/15 0830  INR 1.14 1.04 1.11 1.06  APTT  --   --   --  33    BMP:  Recent Labs  03/05/15 0611 03/06/15 0414 03/07/15 0405 03/09/15 1155 05/10/15 0830  NA 130* 132* 135 126* 138  K 4.0 3.5 3.2* 4.4 4.8  CL 92* 97 103 95* 100*  CO2 30 25 23 27 29   GLUCOSE 96 93 98 94 139*  BUN 10 7 <  5* 14 16  CALCIUM 11.0* 10.3 9.6 9.9 10.2  CREATININE 1.56* 1.35* 1.16* 1.69* 1.46*  GFRNONAA 33* 39* 47*  --  35*  GFRAA 38* 45* 54*  --  41*    LIVER FUNCTION TESTS:  Recent Labs  03/05/15 0611 03/06/15 0414 03/07/15 0405 05/10/15 0830  BILITOT 0.7 0.5 0.8 0.3  AST 30 32 29 31  ALT 21 20 18 15   ALKPHOS 41 42 40 55  PROT 7.9 6.8 7.3 8.6*  ALBUMIN 3.1* 2.9* 3.1* 3.8    TUMOR MARKERS:  Recent Labs  02/27/15 2220  AFPTM 8.4*    Assessment and Plan: Patient with history of alcoholic cirrhosis, acute on chronic kidney disease, recent finding of multiple liver lesions, the largest of which is approximately 1.5 cm within segment VIII of the liver suspicious for hepatocellular carcinoma. Patient presents now following recent IR consultation for elective CT-guided thermal ablation/ possible biopsy of the segment VIII liver lesion. Details/risks of procedure, including but not limited to, internal bleeding, infection, injury to adjacent organs, need for emergent surgery, anesthesia related complications, and death were discussed with patient and family with their understanding and consent. Following the procedure the patient will be admitted for overnight observation.   Signed: D. Rowe Robert 05/14/2015, 8:17 AM   I spent a total of 30 minutes face to face in clinical consultation, greater than 50% of which  was counseling/coordinating care for CT-guided thermal ablation/possible biopsy of liver lesion

## 2015-05-14 NOTE — Anesthesia Preprocedure Evaluation (Addendum)
Anesthesia Evaluation  Patient identified by MRN, date of birth, ID band Patient awake    Reviewed: Allergy & Precautions, NPO status , Patient's Chart, lab work & pertinent test results  Airway Mallampati: II  TM Distance: >3 FB Neck ROM: Full    Dental no notable dental hx.    Pulmonary neg pulmonary ROS,  breath sounds clear to auscultation  Pulmonary exam normal       Cardiovascular hypertension, Pt. 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     Endo/Other  negative endocrine ROS  Renal/GU negative Renal ROS  negative genitourinary   Musculoskeletal negative musculoskeletal ROS (+)   Abdominal   Peds negative pediatric ROS (+)  Hematology  (+) anemia ,   Anesthesia Other Findings   Reproductive/Obstetrics negative OB ROS                            Anesthesia Physical Anesthesia Plan  ASA: III  Anesthesia Plan: General   Post-op Pain Management:    Induction: Intravenous  Airway Management Planned: Oral ETT  Additional Equipment:   Intra-op Plan:   Post-operative Plan: Extubation in OR  Informed Consent: I have reviewed the patients History and Physical, chart, labs and discussed the procedure including the risks, benefits and alternatives for the proposed anesthesia with the patient or authorized representative who has indicated his/her understanding and acceptance.   Dental advisory given  Plan Discussed with: CRNA and Surgeon  Anesthesia Plan Comments:         Anesthesia Quick Evaluation

## 2015-05-14 NOTE — Transfer of Care (Signed)
Immediate Anesthesia Transfer of Care Note  Patient: Sarah Harmon  Procedure(s) Performed: Procedure(s): MICROWAVE LIVER  ABLATION (N/A)  Patient Location: PACU  Anesthesia Type:General  Level of Consciousness: awake and alert   Airway & Oxygen Therapy: Patient Spontanous Breathing and Patient connected to face mask oxygen  Post-op Assessment: Report given to RN and Post -op Vital signs reviewed and stable  Post vital signs: Reviewed and stable  Last Vitals:  Filed Vitals:   05/14/15 0645  BP: 163/73  Pulse: 72  Temp: 36.8 C  Resp: 18    Complications: No apparent anesthesia complications

## 2015-05-14 NOTE — Progress Notes (Addendum)
Day of Surgery  Subjective: Pt doing well; has only minimal RUQ discomfort at this time; denies N/V; has tolerated  diet ok  Objective: Vital signs in last 24 hours: Temp:  [97.5 F (36.4 C)-98.3 F (36.8 C)] 97.7 F (36.5 C) (05/27 1329) Pulse Rate:  [61-72] 61 (05/27 1329) Resp:  [14-22] 14 (05/27 1329) BP: (163-200)/(73-83) 192/80 mmHg (05/27 1329) SpO2:  [96 %-100 %] 98 % (05/27 1329) Weight:  [125 lb (56.7 kg)-126 lb (57.153 kg)] 125 lb (56.7 kg) (05/27 1329)    Intake/Output from previous day:   Intake/Output this shift: Total I/O In: 918 [I.V.:918] Out: 1100 [Urine:1100]  Puncture site RUQ abd clean and dry, soft, no hematoma, mildly tender  Lab Results:  No results for input(s): WBC, HGB, HCT, PLT in the last 72 hours. BMET No results for input(s): NA, K, CL, CO2, GLUCOSE, BUN, CREATININE, CALCIUM in the last 72 hours. PT/INR No results for input(s): LABPROT, INR in the last 72 hours. ABG No results for input(s): PHART, HCO3 in the last 72 hours.  Invalid input(s): PCO2, PO2  Studies/Results: Ct Guide Tissue Ablation  05/14/2015   CLINICAL DATA:  Hyperenhancing right lobe hepatic lesion consistent with hepatocellular carcinoma in the setting of alcoholic cirrhosis and elevated alpha fetoprotein.  EXAM: CT-GUIDED PERCUTANEOUS THERMAL ABLATION OF RIGHT LOBE HEPATOCELLULAR CARCINOMA  COMPARISON:  MRI of the abdomen on 02/28/2015  ANESTHESIA/SEDATION: Anesthesia:  General  Medications:  3.375 g IV Zosyn.  CONTRAST:  75 mL Omnipaque 300 IV  PROCEDURE: The procedure, risks, benefits, and alternatives were explained to the patient. Questions regarding the procedure were encouraged and answered. The patient understands and consents to the procedure. A time-out was performed prior to the procedure.  The patient was placed under general anesthesia. Initial unenhanced CT was performed in a supine position to localize the liver.  The abdominal wall was prepped with Betadine in a  sterile fashion, and a sterile drape was applied covering the operative field. A sterile gown and sterile gloves were used for the procedure.  Contrast enhanced CT was performed to localize the right lobe hepatic tumor. Under CT guidance, a single NeuWave PR thermal ablation probe was advanced into the liver. Probe positioning was confirmed by CT prior to ablation. A cycle of ablation was performed for 7 minutes at 42 W power. Post-procedural CT was performed.  COMPLICATIONS: Small subcapsular hemorrhage. SIR level A: No therapy, no consequence.  FINDINGS: The right lobe segment VIII peripheral lesion was barely visible by unenhanced CT. After contrast administration, there is avid hyperenhancement in the lesion during the arterial phase. Dimensions of the lesion are roughly 1.6 x 1.8 cm with extension up to the capsular surface. Successful thermal ablation was performed. Postprocedural imaging shows expected gas formation at the level of ablation and a small amount of subcapsular hemorrhage adjacent to the lesion.  IMPRESSION: CT guided percutaneous thermal ablation of right lobe hepatocellular carcinoma. The patient will be observed overnight.   Electronically Signed   By: [name] M.D.   On: 05/14/2015 15:35    Anti-infectives: Anti-infectives    Start     Dose/Rate Route Frequency Ordered Stop   05/14/15 0730  piperacillin-tazobactam (ZOSYN) IVPB 3.375 g     3.375 g 12.5 mL/hr over 240 Minutes Intravenous  Once 05/14/15 [id number] 05/14/15 [id number]      Assessment/Plan: s/p CT guided thermal ablation of HCC right hepatic lobe 5/27; check am labs; for overnight obs; f/u with Dr. Kathlene Cote in [facility] with  MRI in 2 months     D. Rowe Robert 05/14/2015

## 2015-05-15 ENCOUNTER — Other Ambulatory Visit: Payer: Self-pay | Admitting: Radiology

## 2015-05-15 DIAGNOSIS — C220 Liver cell carcinoma: Secondary | ICD-10-CM | POA: Diagnosis not present

## 2015-05-15 LAB — CBC
HCT: 31.6 % — ABNORMAL LOW (ref 36.0–46.0)
Hemoglobin: 10.3 g/dL — ABNORMAL LOW (ref 12.0–15.0)
MCH: 29.1 pg (ref 26.0–34.0)
MCHC: 32.6 g/dL (ref 30.0–36.0)
MCV: 89.3 fL (ref 78.0–100.0)
Platelets: 149 10*3/uL — ABNORMAL LOW (ref 150–400)
RBC: 3.54 MIL/uL — ABNORMAL LOW (ref 3.87–5.11)
RDW: 13.8 % (ref 11.5–15.5)
WBC: 6.2 10*3/uL (ref 4.0–10.5)

## 2015-05-15 LAB — COMPREHENSIVE METABOLIC PANEL
ALBUMIN: 3.2 g/dL — AB (ref 3.5–5.0)
ALK PHOS: 42 U/L (ref 38–126)
ALT: 38 U/L (ref 14–54)
AST: 112 U/L — ABNORMAL HIGH (ref 15–41)
Anion gap: 10 (ref 5–15)
BILIRUBIN TOTAL: 0.5 mg/dL (ref 0.3–1.2)
BUN: 11 mg/dL (ref 6–20)
CHLORIDE: 98 mmol/L — AB (ref 101–111)
CO2: 25 mmol/L (ref 22–32)
CREATININE: 1.23 mg/dL — AB (ref 0.44–1.00)
Calcium: 9.1 mg/dL (ref 8.9–10.3)
GFR, EST AFRICAN AMERICAN: 50 mL/min — AB (ref >60)
GFR, EST NON AFRICAN AMERICAN: 43 mL/min — AB (ref >60)
Glucose, Bld: 106 mg/dL — ABNORMAL HIGH (ref 65–99)
POTASSIUM: 3.6 mmol/L (ref 3.5–5.1)
Sodium: 133 mmol/L — ABNORMAL LOW (ref 135–145)
TOTAL PROTEIN: 7.5 g/dL (ref 6.5–8.1)

## 2015-05-15 MED ORDER — HYDROCODONE-ACETAMINOPHEN 5-325 MG PO TABS: 1.0000 | ORAL_TABLET | ORAL | Status: DC | PRN

## 2015-05-15 NOTE — Discharge Summary (Signed)
Patient ID: Sarah Harmon MRN: [id number] DOB/AGE: 01-29-1946 69 y.o.  Admit date: 05/14/2015 Discharge date: 05/15/2015  Admission Diagnoses: Hepatocellular carcinoma   Discharge Diagnoses:  Active Problems:   Hepatocellular carcinoma S/p successful CT guided thermal ablation of hepatocellular carcinoma of right lobe of liver.   Discharged Condition: Good, stable.  Hospital Course: Sarah Harmon is a 69 y.o. female with a history of alcoholism and associated cirrhosis who was recently hospitalized between 03/04/2015 and 03/07/2015. She was found to be hyponatremic at that time and was also treated for significant hypertension, hypercalcemia, encephalopathy and acute on chronic kidney disease. Workup included an ultrasound of the abdomen on 02/28/2015 that demonstrated a suspicious lesion in the right lobe of the liver as well as a complex cystic lesion of the left kidney. These were further evaluated by MRI with Eovist contrast. This demonstrated a 1.5 cm hyperenhancing lesion within segment VIII of the liver suspicious for hepatocellular carcinoma in the setting of cirrhosis and elevated AFP. A subtle 1.0 cm T2 bright lesion at the hepatic dome did not demonstrate abnormal enhancement. This lesion is felt to be indeterminate at this time. She was seen by Dr. Kathlene Cote in recent IR consultation on 04/01/15 for discussion of CT-guided thermal ablation.   She was deemed a candidate for thermal ablation of right lobe presumed [place] lesion by imaging in setting of alcoholic cirrhosis and elevated AFP, she presented 5/27 for the elective procedure. She underwent successful CT guided thermal ablation of right lobe hepatocellular carcinoma with the use of iodinated contrast to better delineate the lesion. Post procedural imaging revealing small subcapsular hemorrhage. The patient was extubated without difficulty and admitted overnight for pain control and observation.   Her labs and vitals are  stable this morning. She denies any significant RUQ or abdominal pain. She has had her foley catheter removed and urinated well without difficulty or hematuria. She has ate a full breakfast without nausea or vomiting. She has ambulated the halls without lightheadedness or dizziness. She denies any fever or chills or active bleeding. She denies any CP or shortness of breath. She states she is ready to be discharged home. She has been seen and examined and is deemed stable for discharge home with the below instructions. She will F/U in IR clinic in 2 months with MRI imaging.   Consults: Anesthesia.   Treatments: Successful CT guided thermal ablation of hepatocellular carcinoma of right lobe of liver.   Discharge Exam: Blood pressure 172/71, pulse 76, temperature 99 F (37.2 C), temperature source Oral, resp. rate 16, height 5\' 3"  (1.6 m), weight 125 lb (56.7 kg), SpO2 100 %.  General: A&Ox3, NAD Heart: RRR without M/G/R Lungs: CTA b/l Abd: Soft, NT, ND, (+) BS, RUQ dressing C/D/I, no signs of bleeding/hematoma, site NT  Disposition: 01-Home or Self Care  Discharge Instructions    Call MD for:  difficulty breathing, headache or visual disturbances    Complete by:  As directed      Call MD for:  extreme fatigue    Complete by:  As directed      Call MD for:  hives    Complete by:  As directed      Call MD for:  persistant dizziness or light-headedness    Complete by:  As directed      Call MD for:  persistant nausea and vomiting    Complete by:  As directed      Call MD for:  redness, tenderness,  or signs of infection (pain, swelling, redness, odor or green/yellow discharge around incision site)    Complete by:  As directed      Call MD for:  severe uncontrolled pain    Complete by:  As directed      Call MD for:  temperature >100.4    Complete by:  As directed      Diet - low sodium heart healthy    Complete by:  As directed      Driving Restrictions    Complete by:  As directed    No driving x 2 days     Increase activity slowly    Complete by:  As directed      Lifting restrictions    Complete by:  As directed   No lifting over 20 lbs x 3 days     Remove dressing in 24 hours    Complete by:  As directed             Medication List    TAKE these medications        alendronate 70 MG tablet  Commonly known as:  FOSAMAX  Take 1 tablet (70 mg total) by mouth every 7 (seven) days. Take with a full glass of water on an empty stomach.     ALPRAZolam 0.5 MG tablet  Commonly known as:  XANAX  TAKE ONE TABLET BY MOUTH AT BEDTIME AS NEEDED     amLODipine 5 MG tablet  Commonly known as:  NORVASC  Take 2 tablets (10 mg total) by mouth daily.     citalopram 20 MG tablet  Commonly known as:  CELEXA  Take 0.5 tablets (10 mg total) by mouth daily.     folic acid 1 MG tablet  Commonly known as:  FOLVITE  TAKE ONE TABLET BY MOUTH ONCE DAILY     hydrALAZINE 100 MG tablet  Commonly known as:  APRESOLINE  Take 1 tablet (100 mg total) by mouth 3 (three) times daily.     HYDROcodone-acetaminophen 5-325 MG per tablet  Commonly known as:  NORCO/VICODIN  Take 1-2 tablets by mouth every 4 (four) hours as needed for moderate pain.     ibuprofen 200 MG tablet  Commonly known as:  ADVIL,MOTRIN  Take 1-2 tablets (200-400 mg total) by mouth every 6 (six) hours as needed for fever, headache, mild pain or moderate pain.     isosorbide dinitrate 10 MG tablet  Commonly known as:  ISORDIL  Take 1 tablet (10 mg total) by mouth 3 (three) times daily with meals.     ketotifen 0.025 % ophthalmic solution  Commonly known as:  ZADITOR  Place 1 drop into both eyes 2 (two) times daily.     lactulose 10 GM/15ML solution  Commonly known as:  CHRONULAC  Take 30 mLs (20 g total) by mouth daily.     multivitamin tablet  Take 1 tablet by mouth every morning.     propranolol 20 MG tablet  Commonly known as:  INDERAL  TAKE ONE TABLET BY MOUTH TWICE DAILY     SYSTANE OP  Place 1  drop into both eyes every morning.     Vitamin D3 1000 UNITS Caps  Take 1 capsule by mouth every morning.           Follow-up Information    Follow up with [facility], MD In 2 months.   Specialty:  Interventional Radiology   Why:  Our office will call with appointment [phone]  Contact information:   [place] [address] [phone]        Signed: Hedy Jacob 05/15/2015, 9:38 AM   I have spent Greater Than 30 Minutes discharging Sarah Harmon.

## 2015-05-15 NOTE — Progress Notes (Signed)
Pt walked in the hallway with assist before discharge

## 2015-05-18 ENCOUNTER — Telehealth: Payer: Self-pay | Admitting: *Deleted

## 2015-05-18 ENCOUNTER — Other Ambulatory Visit: Payer: Self-pay | Admitting: *Deleted

## 2015-05-18 NOTE — Telephone Encounter (Signed)
Oncology Nurse Navigator Documentation  Oncology Nurse Navigator Flowsheets 05/18/2015  Navigator Encounter Type Telephone call  Treatment Phase F/U liver ablation 5/27-no answer at home # and no machine. Will send MyChart message to check on her.

## 2015-05-19 ENCOUNTER — Other Ambulatory Visit: Payer: Self-pay | Admitting: *Deleted

## 2015-05-19 DIAGNOSIS — C220 Liver cell carcinoma: Secondary | ICD-10-CM

## 2015-05-19 NOTE — [facility] Oncology Navigator Note (Signed)
POF to scheduler for lab/OV in 3 months per Dr. Burr Medico.

## 2015-05-21 ENCOUNTER — Other Ambulatory Visit: Payer: Self-pay | Admitting: Internal Medicine

## 2015-05-26 ENCOUNTER — Other Ambulatory Visit (HOSPITAL_COMMUNITY): Payer: Self-pay | Admitting: Interventional Radiology

## 2015-05-26 ENCOUNTER — Other Ambulatory Visit: Payer: Self-pay | Admitting: *Deleted

## 2015-05-26 DIAGNOSIS — C220 Liver cell carcinoma: Secondary | ICD-10-CM

## 2015-06-25 ENCOUNTER — Telehealth: Payer: Self-pay | Admitting: *Deleted

## 2015-06-25 NOTE — Telephone Encounter (Signed)
Oncology Nurse Navigator Documentation  Oncology Nurse Navigator Flowsheets 06/25/2015  Navigator Encounter Type Telephone  Treatment Phase -  Attempted to reach patient again to follow up. No answer and no machine. Will try to catch her at next office visit.

## 2015-07-05 DIAGNOSIS — C220 Liver cell carcinoma: Secondary | ICD-10-CM | POA: Diagnosis not present

## 2015-07-05 LAB — COMPLETE METABOLIC PANEL WITH GFR
ALBUMIN: 3.6 g/dL (ref 3.5–5.2)
ALK PHOS: 53 U/L (ref 39–117)
ALT: 11 U/L (ref 0–35)
AST: 19 U/L (ref 0–37)
BUN: 21 mg/dL (ref 6–23)
CALCIUM: 9.9 mg/dL (ref 8.4–10.5)
CO2: 25 mEq/L (ref 19–32)
CREATININE: 1.43 mg/dL — AB (ref 0.50–1.10)
Chloride: 99 mEq/L (ref 96–112)
GFR, Est African American: 43 mL/min — ABNORMAL LOW
GFR, Est Non African American: 37 mL/min — ABNORMAL LOW
Glucose, Bld: 99 mg/dL (ref 70–99)
POTASSIUM: 4.6 meq/L (ref 3.5–5.3)
Sodium: 133 mEq/L — ABNORMAL LOW (ref 135–145)
TOTAL PROTEIN: 8.1 g/dL (ref 6.0–8.3)
Total Bilirubin: 0.3 mg/dL (ref 0.2–1.2)

## 2015-07-13 ENCOUNTER — Ambulatory Visit (HOSPITAL_COMMUNITY)
Admission: RE | Admit: 2015-07-13 | Discharge: 2015-07-13 | Disposition: A | Discharge status: Home / Self Care | Payer: Medicare Other | Source: Ambulatory Visit | Attending: Interventional Radiology | Admitting: Interventional Radiology

## 2015-07-13 ENCOUNTER — Ambulatory Visit
Admission: RE | Admit: 2015-07-13 | Discharge: 2015-07-13 | Disposition: A | Discharge status: Home / Self Care | Payer: Medicare Other | Source: Ambulatory Visit | Attending: Interventional Radiology | Admitting: Interventional Radiology

## 2015-07-13 DIAGNOSIS — Z8505 Personal history of malignant neoplasm of liver: Secondary | ICD-10-CM | POA: Diagnosis not present

## 2015-07-13 DIAGNOSIS — C220 Liver cell carcinoma: Secondary | ICD-10-CM

## 2015-07-13 DIAGNOSIS — Z08 Encounter for follow-up examination after completed treatment for malignant neoplasm: Secondary | ICD-10-CM | POA: Diagnosis not present

## 2015-07-13 DIAGNOSIS — K8020 Calculus of gallbladder without cholecystitis without obstruction: Secondary | ICD-10-CM | POA: Diagnosis not present

## 2015-07-13 MED ORDER — GADOXETATE DISODIUM 0.25 MMOL/ML IV SOLN
10.0000 mL | Freq: Once | INTRAVENOUS | Status: AC | PRN
  Administered 2015-07-13: 5 mL via INTRAVENOUS

## 2015-07-13 NOTE — Progress Notes (Signed)
Chief Complaint:  Chief Complaint  Patient presents with  . Follow-up     2 mo follow up thermal ablation of [place]       History of Present Illness: Sarah Harmon is a 69 y.o. female status post percutaneous thermal ablation of a segment 8 hepatocellular carcinoma on 05/14/2015. Sarah Harmon did very well following treatment without complication. She has no complaints today. She is not experiencing any abdominal pain.  Past Medical History  Diagnosis Date  . ALCOHOL ABUSE 07/13/2010  . ANXIETY 12/22/2009  . DEPRESSION 12/22/2009  . 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 FATIGUE 12/23/2010  . GALLSTONES 12/22/2009  . HYPERLIPIDEMIA 12/22/2009  . HYPERTENSION 12/22/2009  . HYPONATREMIA 12/22/2009  . 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 OSTEOPENIA 12/22/2009  . Pancytopenia 07/13/2010  . VITAMIN D DEFICIENCY 12/23/2010  . WEIGHT LOSS 12/23/2010  . Gallstones   . RENAL CYST, LEFT 12/22/2009  . Renal cyst   . Headache     occasional   . DJD (degenerative joint disease), cervical     patient denies on preop of 05/10/15     Past Surgical History  Procedure Laterality Date  . Abdominal hysterectomy      Allergies: Review of patient's allergies indicates no known allergies.  Medications: Prior to Admission medications   Medication Sig Start Date End Date Taking? Authorizing Provider  alendronate (FOSAMAX) 70 MG tablet Take 1 tablet (70 mg total) by mouth every 7 (seven) days. Take with a full glass of water on an empty stomach. Patient taking differently: Take 70 mg by mouth every Wednesday.  02/26/15   Biagio Borg, MD  ALPRAZolam Duanne Moron) 0.5 MG tablet TAKE ONE TABLET BY MOUTH AT BEDTIME AS NEEDED Patient taking differently: Take 0.5 mg by mouth at bedtime as needed for anxiety or sleep.  02/26/15   Biagio Borg, MD  amLODipine (NORVASC) 5 MG tablet Take 2 tablets (10 mg total) by mouth daily. 03/02/15 03/01/16  Allie Bossier, MD  Cholecalciferol (VITAMIN D3) 1000 UNITS CAPS Take 1  capsule by mouth every morning.     Historical Provider, MD  citalopram (CELEXA) 20 MG tablet Take 0.5 tablets (10 mg total) by mouth daily. 03/02/15   Allie Bossier, MD  folic acid (FOLVITE) 1 MG tablet TAKE ONE TABLET BY MOUTH ONCE DAILY 05/21/15   Biagio Borg, MD  hydrALAZINE (APRESOLINE) 100 MG tablet Take 1 tablet (100 mg total) by mouth 3 (three) times daily. 05/03/15   Biagio Borg, MD  HYDROcodone-acetaminophen (NORCO/VICODIN) 5-325 MG per tablet Take 1-2 tablets by mouth every 4 (four) hours as needed for moderate pain. 05/15/15   Hedy Jacob, PA-C  ibuprofen (ADVIL,MOTRIN) 200 MG tablet Take 1-2 tablets (200-400 mg total) by mouth every 6 (six) hours as needed for fever, headache, mild pain or moderate pain. Patient not taking: Reported on 07/13/2015 03/07/15   Cherene Altes, MD  isosorbide dinitrate (ISORDIL) 10 MG tablet Take 1 tablet (10 mg total) by mouth 3 (three) times daily with meals. 05/03/15   Biagio Borg, MD  ketotifen (ZADITOR) 0.025 % ophthalmic solution Place 1 drop into both eyes 2 (two) times daily.    Historical Provider, MD  lactulose (CHRONULAC) 10 GM/15ML solution Take 30 mLs (20 g total) by mouth daily. 03/07/15   Cherene Altes, MD  Multiple Vitamin (MULTIVITAMIN) tablet Take 1 tablet by mouth every morning.     Historical Provider, MD  Polyethyl Glycol-Propyl Glycol (  SYSTANE OP) Place 1 drop into both eyes every morning.     Historical Provider, MD  propranolol (INDERAL) 20 MG tablet TAKE ONE TABLET BY MOUTH TWICE DAILY 09/22/14   Biagio Borg, MD     Family History  Problem Relation Age of Onset  . Cancer Sister     breast cancer   . Cancer Brother     lung   . Cancer Sister     gastric cancer   . Cancer Sister     lung cancer  . Cancer Other     breast cancer     History   Social History  . Marital Status: Widowed    Spouse Name: N/A  . Number of Children: N/A  . Years of Education: N/A   Social History Main Topics  . Smoking status: Never  Smoker   . Smokeless tobacco: Never Used  . Alcohol Use: 1.8 - 2.4 oz/week    3-4 Cans of beer per week     Comment: she quit 02/2015   . Drug Use: No  . Sexual Activity: Not on file   Other Topics Concern  . Not on file   Social History Narrative   Widowed, grown son lives with her   Retired from Manufacturing engineer   Has total of #5 children   Enjoys watching TV and planting flowers    ECOG Status: 0 - Asymptomatic  Review of Systems: A 12 point ROS discussed and pertinent positives are indicated in the HPI above.  All other systems are negative.  Review of Systems  Constitutional: Negative.   Respiratory: Negative.   Cardiovascular: Negative.   Gastrointestinal: Negative.   Genitourinary: Negative.   Musculoskeletal: Negative.   Neurological: Negative.     Vital Signs: BP 230/99 mmHg  Pulse 66  Temp(Src) 97.7 F (36.5 C) (Oral)  Resp 13  Ht 5\' 3"  (1.6 m)  Wt 125 lb (56.7 kg)  BMI 22.15 kg/m2  SpO2 99%  Physical Exam  Constitutional: She is oriented to person, place, and time. No distress.  Abdominal: Soft. She exhibits no distension and no mass. There is no tenderness. There is no rebound and no guarding.  Neurological: She is alert and oriented to person, place, and time.  Skin: She is not diaphoretic.  Nursing note and vitals reviewed.   Imaging: Mr Abdomen W Wo Contrast  07/13/2015   CLINICAL DATA:  Subsequent evaluation of a 69 year old female with history of hepatocellular carcinoma status post percutaneous thermal ablation on 05/14/2015. Followup evaluation.  EXAM: MRI ABDOMEN WITHOUT AND WITH CONTRAST  TECHNIQUE: Multiplanar multisequence MR imaging of the abdomen was performed both before and after the administration of intravenous contrast.  CONTRAST:  5 mL of Eovist.  COMPARISON:  MRI of the abdomen 02/28/2015.  FINDINGS: Lower chest:  Unremarkable.  Hepatobiliary: In the periphery of segment 8 of the liver there is a focal area of  heterogeneous T1 signal intensity (peripherally low T1 signal intensity and centrally high T1 signal intensity), which is centrally T2 iso to hypointense, with thin peripheral rim of T2 hyperintensity, which demonstrates no evidence of diffusion restriction, and no significant internal enhancement on post gadolinium images, compatible with a thermal ablation defect. No findings to suggest residual/recurrent disease in this region on today's examination. In the dome of segment 8 there is a 10 mm lesion which is low T1 signal intensity, high T2 signal intensity, and does not enhance, compatible with a small simple cyst. No other suspicious  hepatic lesions are noted. The liver has a slightly shrunken appearance and slightly nodular contour, compatible with underlying cirrhosis. No intra or extrahepatic biliary ductal dilatation. 12 mm filling defect in the gallbladder is compatible with a gallstone. No current findings to suggest an acute cholecystitis at this time.  Pancreas: No pancreatic mass. No pancreatic ductal dilatation. No pancreatic or peripancreatic fluid or inflammatory changes.  Spleen: Normal in size in appearance.  Adrenals/Urinary Tract: Bilateral adrenal glands and the right kidney are normal in appearance. Again noted is a complex multilocular lesion in the medial aspect of the upper pole of the left kidney which measures approximately 4.0 x 3.0 cm (image 22 of series 8) and contains multiple internal septations, but is generally T1 hypointense and T2 hyperintense, without significant enhancement. This is similar in size in appearance dating back to remote prior studies from 08/08/2007, presumably benign. No hydroureteronephrosis in the visualized abdomen.  Stomach/Bowel: Visualized portions are unremarkable.  Vascular/Lymphatic: No aneurysm noted. No lymphadenopathy noted in the visualized portions of the abdomen.  Other: No significant volume of ascites.  Musculoskeletal: No aggressive osseous  lesions noted in the visualized portions of the skeleton.  IMPRESSION: 1. Expected post procedural changes of thermal ablation in segment 8 of the liver, without evidence to suggest residual/recurrent disease on today's examination. Cirrhotic liver with no new hepatic lesions otherwise noted. 2. Cholelithiasis without evidence of acute cholecystitis at this time. 3. Additional incidental findings, similar prior studies, as above.   Electronically Signed   By: Vinnie Langton M.D.   On: 07/13/2015 09:31    Labs:  CBC:  Recent Labs  03/06/15 0414 03/07/15 0405 05/10/15 0830 05/15/15 0420  WBC 4.1 4.7 5.3 6.2  HGB 11.7* 12.1 11.4* 10.3*  HCT 34.1* 35.2* 35.5* 31.6*  PLT 161 188 186 149*    COAGS:  Recent Labs  02/27/15 1915 02/28/15 0335 03/01/15 0352 05/10/15 0830  INR 1.14 1.04 1.11 1.06  APTT  --   --   --  33    BMP:  Recent Labs  03/07/15 0405 03/09/15 1155 05/10/15 0830 05/15/15 0420 07/05/15 0803  NA 135 126* 138 133* 133*  K 3.2* 4.4 4.8 3.6 4.6  CL 103 95* 100* 98* 99  CO2 23 27 29 25 25   GLUCOSE 98 94 139* 106* 99  BUN <5* 14 16 11 21   CALCIUM 9.6 9.9 10.2 9.1 9.9  CREATININE 1.16* 1.69* 1.46* 1.23* 1.43*  GFRNONAA 47*  --  35* 43* 37*  GFRAA 54*  --  41* 50* 43*    LIVER FUNCTION TESTS:  Recent Labs  03/07/15 0405 05/10/15 0830 05/15/15 0420 07/05/15 0803  BILITOT 0.8 0.3 0.5 0.3  AST 29 31 112* 19  ALT 18 15 38 11  ALKPHOS 40 55 42 53  PROT 7.3 8.6* 7.5 8.1  ALBUMIN 3.1* 3.8 3.2* 3.6    TUMOR MARKERS:  Recent Labs  02/27/15 2220  AFPTM 8.4*    Assessment and Plan:  Sarah Harmon is doing very well after thermal ablation of a hepatocellular carcinoma. Liver function tests are normal.  I reviewed follow-up MRI images today with Sarah Harmon and her daughter. The study shows excellent response to initial treatment with a zone of ablation noted encompassing the original enhancing lesion. There is no evidence of enhancement at the  level of ablation. The small lesion at the dome of the liver appears by today's MRI to most likely represent a nonenhancing cyst.  I recommend a follow-up MRI  in 6 months. She will also need repeat AFP level checked. She does have some follow-up appointments with the [facility] in the next couple months.  SignedAletta Edouard T 07/13/2015, 11:25 AM    I spent a total of 15 Minutes in face to face in clinical consultation, greater than 50% of which was counseling/coordinating care post ablation of a right lobe hepatocellular carcinoma.

## 2015-07-23 ENCOUNTER — Telehealth: Payer: Self-pay | Admitting: Internal Medicine

## 2015-07-23 DIAGNOSIS — L659 Nonscarring hair loss, unspecified: Secondary | ICD-10-CM

## 2015-07-23 NOTE — Telephone Encounter (Signed)
Pt daughter called in and wanted to talk to Dr Jenny Reichmann nurse.  They seem to think that the hydrALAZINE (APRESOLINE) 100 MG tablet [id number]  Is making pt losing her hair.  She is requesting a return call.   Daughter -[phone]

## 2015-07-23 NOTE — Telephone Encounter (Signed)
Please advise, is this a side effect of this medication?

## 2015-07-23 NOTE — Telephone Encounter (Signed)
Not normallly, I would Not say the medication is the likely cause, maybe consider derm referral

## 2015-07-28 NOTE — Telephone Encounter (Signed)
Pt advised and request referral to Dermatology

## 2015-07-28 NOTE — Telephone Encounter (Signed)
Referral done

## 2015-08-19 ENCOUNTER — Encounter: Payer: Self-pay | Admitting: Hematology

## 2015-08-19 ENCOUNTER — Other Ambulatory Visit: Payer: Self-pay

## 2015-08-19 NOTE — Progress Notes (Signed)
No show  This encounter was created in error - please disregard.

## 2015-08-20 ENCOUNTER — Telehealth: Payer: Self-pay | Admitting: Hematology

## 2015-08-20 NOTE — Telephone Encounter (Signed)
perp of to r/s missed appt-cld pt daughter [name] and gave r/s time & date-daughter understood

## 2015-08-31 ENCOUNTER — Ambulatory Visit (INDEPENDENT_AMBULATORY_CARE_PROVIDER_SITE_OTHER): Payer: Medicare Other | Admitting: Internal Medicine

## 2015-08-31 VITALS — BP 126/72 | HR 68 | Temp 97.8°F | Ht 63.0 in | Wt 128.0 lb

## 2015-08-31 DIAGNOSIS — I10 Essential (primary) hypertension: Secondary | ICD-10-CM | POA: Diagnosis not present

## 2015-08-31 DIAGNOSIS — R7302 Impaired glucose tolerance (oral): Secondary | ICD-10-CM

## 2015-08-31 DIAGNOSIS — E785 Hyperlipidemia, unspecified: Secondary | ICD-10-CM | POA: Diagnosis not present

## 2015-08-31 MED ORDER — CITALOPRAM HYDROBROMIDE 20 MG PO TABS: 10.0000 mg | ORAL_TABLET | Freq: Every day | ORAL | Status: DC

## 2015-08-31 MED ORDER — AMLODIPINE BESYLATE 5 MG PO TABS: 10.0000 mg | ORAL_TABLET | Freq: Every day | ORAL | Status: DC

## 2015-08-31 NOTE — Assessment & Plan Note (Signed)
stable overall by history and exam, recent data reviewed with pt, and pt to continue medical treatment as before,  to f/u any worsening symptoms or concerns BP Readings from Last 3 Encounters:  08/31/15 126/72  07/13/15 230/99  05/15/15 172/71

## 2015-08-31 NOTE — Assessment & Plan Note (Signed)
stable overall by history and exam, recent data reviewed with pt, and pt to continue medical treatment as before,  to f/u any worsening symptoms or concerns Lab Results  Component Value Date   HGBA1C 5.8 02/26/2015   Declines f/u labs today

## 2015-08-31 NOTE — Progress Notes (Signed)
Pre visit review using our clinic review tool, if applicable. No additional management support is needed unless otherwise documented below in the visit note. 

## 2015-08-31 NOTE — Assessment & Plan Note (Signed)
stable overall by history and exam, recent data reviewed with pt, and pt to continue medical treatment as before,  to f/u any worsening symptoms or concerns Lab Results  Component Value Date   LDLCALC 51 02/26/2015

## 2015-08-31 NOTE — Progress Notes (Signed)
Subjective:    Patient ID: Sarah Harmon, female    DOB: 11/04/1946, 69 y.o.   MRN: [id number]  HPI  Here to f/u; overall doing ok,  Pt denies chest pain, increasing sob or doe, wheezing, orthopnea, PND, increased LE swelling, palpitations, dizziness or syncope.  Pt denies new neurological symptoms such as new headache, or facial or extremity weakness or numbness.  Pt denies polydipsia, polyuria, or low sugar episode.   Pt denies new neurological symptoms such as new headache, or facial or extremity weakness or numbness.   Pt states overall good compliance with meds, mostly trying to follow appropriate diet, with wt overall stable,  but little exercise however. No new complints. Denies worsening reflux, abd pain, dysphagia, n/v, bowel change or blood. No overall pain Past Medical History  Diagnosis Date  . ALCOHOL ABUSE 07/13/2010  . ANXIETY 12/22/2009  . DEPRESSION 12/22/2009  . 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 FATIGUE 12/23/2010  . GALLSTONES 12/22/2009  . HYPERLIPIDEMIA 12/22/2009  . HYPERTENSION 12/22/2009  . HYPONATREMIA 12/22/2009  . 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 OSTEOPENIA 12/22/2009  . Pancytopenia 07/13/2010  . VITAMIN D DEFICIENCY 12/23/2010  . WEIGHT LOSS 12/23/2010  . Gallstones   . RENAL CYST, LEFT 12/22/2009  . Renal cyst   . Headache     occasional   . DJD (degenerative joint disease), cervical     patient denies on preop of 05/10/15    Past Surgical History  Procedure Laterality Date  . Abdominal hysterectomy      reports that she has never smoked. She has never used smokeless tobacco. She reports that she drinks about 1.8 - 2.4 oz of alcohol per week. She reports that she does not use illicit drugs. family history includes Cancer in her brother, other, sister, sister, and sister. No Known Allergies Current Outpatient Prescriptions on File Prior to Visit  Medication Sig Dispense Refill  . alendronate (FOSAMAX) 70 MG tablet Take 1 tablet (70 mg total) by mouth  every 7 (seven) days. Take with a full glass of water on an empty stomach. (Patient taking differently: Take 70 mg by mouth every Wednesday. ) 12 tablet 3  . ALPRAZolam (XANAX) 0.5 MG tablet TAKE ONE TABLET BY MOUTH AT BEDTIME AS NEEDED (Patient taking differently: Take 0.5 mg by mouth at bedtime as needed for anxiety or sleep. ) 90 tablet 1  . Cholecalciferol (VITAMIN D3) 1000 UNITS CAPS Take 1 capsule by mouth every morning.     . folic acid (FOLVITE) 1 MG tablet TAKE ONE TABLET BY MOUTH ONCE DAILY 90 tablet 3  . hydrALAZINE (APRESOLINE) 100 MG tablet Take 1 tablet (100 mg total) by mouth 3 (three) times daily. 270 tablet 3  . HYDROcodone-acetaminophen (NORCO/VICODIN) 5-325 MG per tablet Take 1-2 tablets by mouth every 4 (four) hours as needed for moderate pain. 30 tablet 0  . ibuprofen (ADVIL,MOTRIN) 200 MG tablet Take 1-2 tablets (200-400 mg total) by mouth every 6 (six) hours as needed for fever, headache, mild pain or moderate pain. 30 tablet 0  . isosorbide dinitrate (ISORDIL) 10 MG tablet Take 1 tablet (10 mg total) by mouth 3 (three) times daily with meals. 270 tablet 3  . ketotifen (ZADITOR) 0.025 % ophthalmic solution Place 1 drop into both eyes 2 (two) times daily.    Marland Kitchen lactulose (CHRONULAC) 10 GM/15ML solution Take 30 mLs (20 g total) by mouth daily. 240 mL 0  . Multiple Vitamin (MULTIVITAMIN) tablet Take 1 tablet by mouth  every morning.     Vladimir Faster Glycol-Propyl Glycol (SYSTANE OP) Place 1 drop into both eyes every morning.     . propranolol (INDERAL) 20 MG tablet TAKE ONE TABLET BY MOUTH TWICE DAILY 60 tablet 6   No current facility-administered medications on file prior to visit.    Review of Systems  Constitutional: Negative for unusual diaphoresis or night sweats HENT: Negative for ringing in ear or discharge Eyes: Negative for double vision or worsening visual disturbance.  Respiratory: Negative for choking and stridor.   Gastrointestinal: Negative for vomiting or other  signifcant bowel change Genitourinary: Negative for hematuria or change in urine volume.  Musculoskeletal: Negative for other MSK pain or swelling Skin: Negative for color change and worsening wound.  Neurological: Negative for tremors and numbness other than noted  Psychiatric/Behavioral: Negative for decreased concentration or agitation other than above       Objective:   Physical Exam BP 126/72 mmHg  Pulse 68  Temp(Src) 97.8 F (36.6 C) (Oral)  Ht 5\' 3"  (1.6 m)  Wt 128 lb (58.06 kg)  BMI 22.68 kg/m2  SpO2 97% VS noted,  Constitutional: Pt appears in no significant distress HENT: Head: NCAT.  Right Ear: External ear normal.  Left Ear: External ear normal.  Eyes: . Pupils are equal, round, and reactive to light. Conjunctivae and EOM are normal Neck: Normal range of motion. Neck supple.  Cardiovascular: Normal rate and regular rhythm.   Pulmonary/Chest: Effort normal and breath sounds without rales or wheezing.  Abd:  Soft, NT, ND, + BS Neurological: Pt is alert. Not confused , motor grossly intact Skin: Skin is warm. No rash, no LE edema Psychiatric: Pt behavior is normal. No agitation.     Assessment & Plan:

## 2015-08-31 NOTE — Patient Instructions (Signed)
Please continue all other medications as before, and refills have been done if requested.  Please have the pharmacy call with any other refills you may need.  Please continue your efforts at being more active, low cholesterol diet, and weight control.  You are otherwise up to date with prevention measures today.  Please keep your appointments with your specialists as you may have planned  We will need lab work next time  Please consider a Nurse Appt for the flu shot if you change your mind  Please return in 6 months, or sooner if needed

## 2015-09-16 ENCOUNTER — Telehealth: Payer: Self-pay | Admitting: Internal Medicine

## 2015-09-16 ENCOUNTER — Other Ambulatory Visit (HOSPITAL_BASED_OUTPATIENT_CLINIC_OR_DEPARTMENT_OTHER): Payer: Medicare Other

## 2015-09-16 ENCOUNTER — Encounter: Payer: Self-pay | Admitting: *Deleted

## 2015-09-16 ENCOUNTER — Telehealth: Payer: Self-pay | Admitting: Hematology

## 2015-09-16 ENCOUNTER — Ambulatory Visit (HOSPITAL_BASED_OUTPATIENT_CLINIC_OR_DEPARTMENT_OTHER): Payer: Medicare Other | Admitting: Hematology

## 2015-09-16 ENCOUNTER — Encounter: Payer: Self-pay | Admitting: Hematology

## 2015-09-16 VITALS — BP 163/73 | HR 64 | Temp 98.2°F | Resp 18 | Ht 63.0 in | Wt 129.7 lb

## 2015-09-16 DIAGNOSIS — C220 Liver cell carcinoma: Secondary | ICD-10-CM

## 2015-09-16 DIAGNOSIS — K7460 Unspecified cirrhosis of liver: Secondary | ICD-10-CM

## 2015-09-16 LAB — COMPREHENSIVE METABOLIC PANEL (CC13)
ALT: 11 U/L (ref 0–55)
ANION GAP: 10 meq/L (ref 3–11)
AST: 24 U/L (ref 5–34)
Albumin: 3.3 g/dL — ABNORMAL LOW (ref 3.5–5.0)
Alkaline Phosphatase: 59 U/L (ref 40–150)
BILIRUBIN TOTAL: 0.3 mg/dL (ref 0.20–1.20)
BUN: 13.7 mg/dL (ref 7.0–26.0)
CHLORIDE: 101 meq/L (ref 98–109)
CO2: 23 meq/L (ref 22–29)
CREATININE: 1.3 mg/dL — AB (ref 0.6–1.1)
Calcium: 9.6 mg/dL (ref 8.4–10.4)
EGFR: 46 mL/min/{1.73_m2} — ABNORMAL LOW (ref >90)
Glucose: 120 mg/dl (ref 70–140)
Potassium: 5 mEq/L (ref 3.5–5.1)
Sodium: 134 mEq/L — ABNORMAL LOW (ref 136–145)
TOTAL PROTEIN: 8.2 g/dL (ref 6.4–8.3)

## 2015-09-16 LAB — CBC WITH DIFFERENTIAL/PLATELET
BASO%: 0.9 % (ref 0.0–2.0)
Basophils Absolute: 0 10*3/uL (ref 0.0–0.1)
EOS%: 12.2 % — AB (ref 0.0–7.0)
Eosinophils Absolute: 0.4 10*3/uL (ref 0.0–0.5)
HCT: 35.2 % (ref 34.8–46.6)
HGB: 11.5 g/dL — ABNORMAL LOW (ref 11.6–15.9)
LYMPH#: 0.9 10*3/uL (ref 0.9–3.3)
LYMPH%: 24.7 % (ref 14.0–49.7)
MCH: 28.2 pg (ref 25.1–34.0)
MCHC: 32.8 g/dL (ref 31.5–36.0)
MCV: 85.9 fL (ref 79.5–101.0)
MONO#: 0.4 10*3/uL (ref 0.1–0.9)
MONO%: 10.4 % (ref 0.0–14.0)
NEUT%: 51.8 % (ref 38.4–76.8)
NEUTROS ABS: 1.9 10*3/uL (ref 1.5–6.5)
PLATELETS: 133 10*3/uL — AB (ref 145–400)
RBC: 4.09 10*6/uL (ref 3.70–5.45)
RDW: 15.4 % — ABNORMAL HIGH (ref 11.2–14.5)
WBC: 3.6 10*3/uL — AB (ref 3.9–10.3)

## 2015-09-16 NOTE — Telephone Encounter (Signed)
Gave and printed appt sched and avs fo rpt for May 2017 °

## 2015-09-16 NOTE — Progress Notes (Signed)
Oncology Nurse Navigator Documentation  Oncology Nurse Navigator Flowsheets 09/16/2015  Navigator Encounter Type 6 month  Patient Visit Type Medonc  Treatment Phase Treatment-s/p ablation 05/14/15  Barriers/Navigation Needs Family concerns--needs GI physician  Interventions Referrals-message to PCP requesting GI referral for management of cirrhosis since she is Medicaid.  Referrals In basket message to PCP  Time Spent with Patient [place] is feeling well, good appetite-not eating normal diet due to waiting for her dentures to be adjusted. No pain.

## 2015-09-16 NOTE — Progress Notes (Signed)
[place]  [phone] Fax:([phone]) [phone]  Clinic Follow Up Note   Patient Care Team: Biagio Borg, MD as PCP - General Truitt Merle, MD as Consulting Physician (Hematology) 09/16/2015  CHIEF COMPLAINTS:  Follow up stage I Hepatocellular carcinoma  Oncology History   Hepatocellular carcinoma   Staging form: Liver (Excluding Intrahepatic Bile Ducts), AJCC 7th Edition     Clinical: Stage I (T1, N0, M0) - Unsigned       Hepatocellular carcinoma   03/01/2015 Tumor Marker AFP 8.4   03/01/2015 Imaging Liver MRI showed home 0.5 cm hyperenhancing T2 bright lesion within the right hepatic lobe, consistent with hepatocellular carcinoma. There is an indeterminate 810 mm T2 bright lesion within the hepatic dome, w/o definitive arterial phase enhancement    03/01/2015 Initial Diagnosis Hepatocellular carcinoma   05/14/2015 Procedure CT guided thermal ablation of hepatocellular carcinoma   HISTORY OF PRESENTING ILLNESS:  Sarah Harmon 69 y.o. female is here because of abnormal liver MRI, which is highly suspicious for [facility].  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 No history of ascites or GI bleeding. She has been followed I her primary care physician since then, has not been read by GI. she did quit drinking a few times, and started drinking again about 2 years ago.  She was seen by PCP for routine checkup and was found to have low sodium, she was referred to Hospital and was admitted. She had abdominal ultrasound done on 05/31/2015 which showed liver cirrhosis and a questionable mass in the right lobe measuring 2 cm. She underwent a liver MRI on same day, which showed a 1.5 cm enhancing lesion in the right lobe of liver, and an additional 1.0 cm indeterminate lesion in the liver. AFP was found to be elevated at 8.4. No liver biopsy was performed. She was subsequently referred to our  cancer center for further evaluation.  She lives with her son at home. She is able to do all her ADLs, and some light housework. She goes out for shopping with her daughters, does not drive. She denies any abdominal discomfort or bloating, no nausea, vomiting, diarrhea or constipation. Her appetite and energy level is moderate, unchanged from before. No recent weight loss.  INTERIM HISTORY Sarah Harmon returns for follow-up, she is accompanied to the clinic by her son. She underwent CT-guided thermal ablation of her liver cancer on 05/14/2015 by Dr. Kathlene Cote. She tolerated the procedure very well. Her repeated scan in July showed good response. She is doing well clinically, denies significant pain, abdominal discomfort, or other symptoms. She has quit drinking alcohol completely. She has good appetite, but can't eat some food because of her teeth loss.   MEDICAL HISTORY:  Past Medical History  Diagnosis Date  . ALCOHOL ABUSE 07/13/2010  . ANXIETY 12/22/2009  . DEPRESSION 12/22/2009  . 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 FATIGUE 12/23/2010  . GALLSTONES 12/22/2009  . HYPERLIPIDEMIA 12/22/2009  . HYPERTENSION 12/22/2009  . HYPONATREMIA 12/22/2009  . 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 OSTEOPENIA 12/22/2009  . Pancytopenia 07/13/2010  . VITAMIN D DEFICIENCY 12/23/2010  . WEIGHT LOSS 12/23/2010  . Gallstones   . RENAL CYST, LEFT 12/22/2009  . Renal cyst   . Headache     occasional   . DJD (degenerative joint disease), cervical     patient denies on preop of 05/10/15     SURGICAL HISTORY: Past Surgical History  Procedure Laterality Date  . Abdominal hysterectomy      SOCIAL HISTORY: History   Social History  . Marital Status: Widowed    Spouse Name: N/A  . Number of Children: 5, she lives with one of her son   . Years of Education: N/A   Occupational History  . Retired    Social History Main Topics  . Smoking status: Never Smoker   . Smokeless tobacco: Not on file  . Alcohol Use: Yes  .  Drug Use: No  . Sexual Activity: Not on file   Other Topics Concern  . Not on file   Social History Narrative    FAMILY HISTORY: Family History  Problem Relation Age of Onset  . Cancer Sister     breast cancer   . Cancer Brother     lung   . Cancer Sister     gastric cancer   . Cancer Sister     lung cancer  . Cancer Other     breast cancer     ALLERGIES:  has No Known Allergies.  MEDICATIONS:  Current Outpatient Prescriptions  Medication Sig Dispense Refill  . alendronate (FOSAMAX) 70 MG tablet Take 1 tablet (70 mg total) by mouth every 7 (seven) days. Take with a full glass of water on an empty stomach. (Patient taking differently: Take 70 mg by mouth every Wednesday. ) 12 tablet 3  . ALPRAZolam (XANAX) 0.5 MG tablet TAKE ONE TABLET BY MOUTH AT BEDTIME AS NEEDED (Patient taking differently: Take 0.5 mg by mouth at bedtime as needed for anxiety or sleep. ) 90 tablet 1  . amLODipine (NORVASC) 5 MG tablet Take 2 tablets (10 mg total) by mouth daily. 90 tablet 1  . Cholecalciferol (VITAMIN D3) 1000 UNITS CAPS Take 1 capsule by mouth every morning.     . citalopram (CELEXA) 20 MG tablet Take 0.5 tablets (10 mg total) by mouth daily. 90 tablet 1  . folic acid (FOLVITE) 1 MG tablet TAKE ONE TABLET BY MOUTH ONCE DAILY 90 tablet 3  . hydrALAZINE (APRESOLINE) 100 MG tablet Take 1 tablet (100 mg total) by mouth 3 (three) times daily. 270 tablet 3  . HYDROcodone-acetaminophen (NORCO/VICODIN) 5-325 MG per tablet Take 1-2 tablets by mouth every 4 (four) hours as needed for moderate pain. 30 tablet 0  . ibuprofen (ADVIL,MOTRIN) 200 MG tablet Take 1-2 tablets (200-400 mg total) by mouth every 6 (six) hours as needed for fever, headache, mild pain or moderate pain. 30 tablet 0  . isosorbide dinitrate (ISORDIL) 10 MG tablet Take 1 tablet (10 mg total) by mouth 3 (three) times daily with meals. 270 tablet 3  . ketotifen (ZADITOR) 0.025 % ophthalmic solution Place 1 drop into both eyes 2 (two)  times daily.    Marland Kitchen lactulose (CHRONULAC) 10 GM/15ML solution Take 30 mLs (20 g total) by mouth daily. 240 mL 0  . Multiple Vitamin (MULTIVITAMIN) tablet Take 1 tablet by mouth every morning.     Vladimir Faster Glycol-Propyl Glycol (SYSTANE OP) Place 1 drop into both eyes every morning.     . propranolol (INDERAL) 20 MG tablet TAKE ONE TABLET BY MOUTH TWICE DAILY 60 tablet 6   No current facility-administered medications for this visit.    REVIEW OF SYSTEMS:   Constitutional: Denies fevers, chills or abnormal night sweats Eyes: Denies blurriness of vision, double vision or watery eyes Ears, nose, mouth, throat, and face: Denies mucositis or sore throat Respiratory: Denies cough, dyspnea or wheezes Cardiovascular: Denies  palpitation, chest discomfort or lower extremity swelling Gastrointestinal:  Denies nausea, heartburn or change in bowel habits Skin: Denies abnormal skin rashes Lymphatics: Denies new lymphadenopathy or easy bruising Neurological:Denies numbness, tingling or new weaknesses Behavioral/Psych: Mood is stable, no new changes  All other systems were reviewed with the patient and are negative.  PHYSICAL EXAMINATION: ECOG PERFORMANCE STATUS: 1 - Symptomatic but completely ambulatory  Filed Vitals:   09/16/15 1012  BP: 163/73  Pulse: 64  Temp: 98.2 F (36.8 C)  Resp: 18   Filed Weights   09/16/15 1012  Weight: 129 lb 11.2 oz (58.832 kg)    GENERAL:alert, no distress and comfortable SKIN: skin color, texture, turgor are normal, no rashes or significant lesions EYES: normal, conjunctiva are pink and non-injected, sclera clear OROPHARYNX:no exudate, no erythema and lips, buccal mucosa, and tongue normal  NECK: supple, thyroid normal size, non-tender, without nodularity LYMPH:  no palpable lymphadenopathy in the cervical, axillary or inguinal LUNGS: clear to auscultation and percussion with normal breathing effort HEART: regular rate & rhythm and no murmurs and no lower  extremity edema ABDOMEN:abdomen soft, non-tender and normal bowel sounds Musculoskeletal:no cyanosis of digits and no clubbing  PSYCH: alert & oriented x 3 with fluent speech NEURO: no focal motor/sensory deficits  LABORATORY DATA:  I have reviewed the data as listed CBC Latest Ref Rng 09/16/2015 05/15/2015 05/10/2015  WBC 3.9 - 10.3 10e3/uL 3.6(L) 6.2 5.3  Hemoglobin 11.6 - 15.9 g/dL 11.5(L) 10.3(L) 11.4(L)  Hematocrit 34.8 - 46.6 % 35.2 31.6(L) 35.5(L)  Platelets 145 - 400 10e3/uL 133(L) 149(L) 186    Recent Labs  02/26/15 1043  05/10/15 0830 05/15/15 0420 07/05/15 0803 09/16/15 0919  NA 118*  < > 138 133* 133* 134*  K 4.6  < > 4.8 3.6 4.6 5.0  CL 86*  < > 100* 98* 99  --   CO2 27  < > 29 25 25 23   GLUCOSE 76  < > 139* 106* 99 120  BUN 13  < > 16 11 21  13.7  CREATININE 1.17  < > 1.46* 1.23* 1.43* 1.3*  CALCIUM 10.5  < > 10.2 9.1 9.9 9.6  GFRNONAA  --   < > 35* 43* 37*  --   GFRAA  --   < > 41* 50* 43*  --   PROT 8.2  < > 8.6* 7.5 8.1 8.2  ALBUMIN 3.7  < > 3.8 3.2* 3.6 3.3*  AST 35  < > 31 112* 19 24  ALT 24  < > 15 38 11 11  ALKPHOS 54  < > 55 42 53 59  BILITOT 0.5  < > 0.3 0.5 0.3 0.30  BILIDIR 0.1  --   --   --   --   --   < > = values in this interval not displayed.   RADIOGRAPHIC STUDIES: I have personally reviewed the radiological images as listed and agreed with the findings in the report.  Mr Liver W Wo Contrast  03/01/2015   CLINICAL DATA:  Patient with history of hepatic cirrhosis secondary to alcoholism.  EXAM: MRI ABDOMEN WITHOUT AND WITH CONTRAST  TECHNIQUE: Multiplanar multisequence MR imaging of the abdomen was performed both before and after the administration of intravenous contrast.  CONTRAST:  5 mm Eovist  COMPARISON:  Ultrasound 02/28/2015  FINDINGS: Lower chest:  No definite pleural effusion.  Normal heart size.  Hepatobiliary: Within the peripheral right hepatic lobe there is a 1.5 cm intermediate T2 bright hyperenhancing lesion with  rapid washout  (image 25 ; series 1301) and peripheral rim enhancement.  Within the hepatic dome (image 4; series 16) (image 3; series 19) there is a 10 mm T2 bright lesion without definitive enhancement demonstrated on postcontrast images. The lesion is able to be visualized on the 20 minutes delayed sequence (image 16; series 21).  The liver is nodular in contour and cirrhotic in morphology. The left hepatic lobe is heterogeneous over the post contrast enhanced phases. There is a large gallstone demonstrated within the gallbladder lumen. No gallbladder wall thickening. No intrahepatic or extrahepatic biliary ductal dilatation.  Pancreas: Unremarkable  Spleen: Unremarkable  Adrenals/Urinary Tract: Normal bilateral adrenal glands. Kidneys enhance symmetrically with contrast. 3.8 x 2.2 cm multiloculated cyst within the superior pole of the left kidney, grossly stable dating back to 2008. No hydronephrosis.  Stomach/Bowel: Visualized small large bowel is unremarkable.  Vascular/Lymphatic: Visualized abdominal aorta is normal in caliber. No retroperitoneal lymphadenopathy.  Other: None  Musculoskeletal: Normal osseous marrow signal.    IMPRESSION: There is a 1.5 cm hyperenhancing T2 bright lesion within the right hepatic lobe which demonstrates washout. Imaging findings in the setting of cirrhosis are diagnostic of hepatocellular carcinoma. In not already obtained, multidisciplinary (surgical, oncological and interventional radiology) consultation is recommended.  There is an indeterminate 10 mm T2 bright lesion within the hepatic dome which is confirmed on the 20 minutes post delayed images. No definite arterial phase enhancement is able to be demonstrated however evaluation of the hepatic dome is limited secondary to motion artifact.  Cholelithiasis.   Electronically Signed   By: Lovey Newcomer M.D.   On: 03/01/2015 10:20   Liver MRI w wo contrast 07/13/2015  IMPRESSION: 1. Expected post procedural changes of thermal ablation in  segment 8 of the liver, without evidence to suggest residual/recurrent disease on today's examination. Cirrhotic liver with no new hepatic lesions otherwise noted. 2. Cholelithiasis without evidence of acute cholecystitis at this time. 3. Additional incidental findings, similar prior studies, as above.  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  1. Stage I hepatocellular carcinoma  -I reviewed her liver MRI scan images with her and her family members in great detail. The 1.5 cm liver lesion in right lobe has typical arterial enhancement and venous washout, his consistent with [place], in the background of liver cirrhosis. The other 1.0 centimeter lesion does not have typical arterial enhancement, and is indeterminate.  -She also has elevated AFP. -Her liver MRI is diagnostic, I do not think she needs biopsy to confirm. -She is now status post CT-guided microwave ablation by Dr. Kathlene Cote in May 2016, and had a good response. -We discussed the risk of cancer recurrence after ablation, and she needs to be followed closely. -We'll continue surveillance. She will follow-up with Dr. Kathlene Cote for restaging scan  -Lab reviewed, her liver function is normal. Mild cytopenias stable. Her AFP from today still pending  2. Mild anemia and thrombocytopenia -Likely secondary to liver cirrhosis and splenomegaly -Her [id number] and folic acid level were high in 2011, we'll check her iron studies next time -Blood counts are stable, we'll continue monitoring.  2. 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 I'll send a message to her primary care physician Dr. Jenny Reichmann and let him refer her to GI, due to her insurance issue   3. Hypertension, not well controlled -She  will continue medication -She will monitor her blood pressure  at home and follow-up with her primary care physician  Follow-up, I'll see her back in 8 month with lab     Orders Placed This Encounter  Procedures  . AFP tumor marker    Standing Status: Future     Number of Occurrences: 1     Standing Expiration Date: 09/15/2016    All questions were answered. The patient knows to call the clinic with any problems, questions or concerns. I spent 45 minutes counseling the patient face to face. The total time spent in the appointment was 60 minutes and more than 50% was on counseling.     Truitt Merle, MD 09/16/2015 11:04 AM

## 2015-09-16 NOTE — Telephone Encounter (Signed)
-----   Message from Tania Ade, RN sent at 09/16/2015 11:20 AM EDT ----- Regarding: GI Referral to [place] Saw Dr. Burr Medico today. Dr. Burr Medico would like to have her referred to GI to manage her cirrhosis. Since she is Medicaid, probably has to come from PCP. Could your office make the referral please-"routine"  Thanks Merceda Elks, RN

## 2015-09-16 NOTE — Telephone Encounter (Signed)
referral doe

## 2015-09-17 LAB — AFP TUMOR MARKER: AFP-Tumor Marker: 9.4 ng/mL — ABNORMAL HIGH (ref <6.1)

## 2015-10-26 ENCOUNTER — Other Ambulatory Visit: Payer: Self-pay | Admitting: Internal Medicine

## 2015-10-27 NOTE — Telephone Encounter (Signed)
Rx faxed to pharmacy  

## 2015-10-27 NOTE — Telephone Encounter (Signed)
Done hardcopy to Dahlia  

## 2015-11-10 ENCOUNTER — Encounter: Payer: Self-pay | Admitting: Gastroenterology

## 2015-11-10 ENCOUNTER — Ambulatory Visit (INDEPENDENT_AMBULATORY_CARE_PROVIDER_SITE_OTHER): Payer: Medicare Other | Admitting: Gastroenterology

## 2015-11-10 ENCOUNTER — Other Ambulatory Visit (INDEPENDENT_AMBULATORY_CARE_PROVIDER_SITE_OTHER): Payer: Medicare Other

## 2015-11-10 VITALS — BP 168/78 | HR 64 | Ht 63.0 in | Wt 128.2 lb

## 2015-11-10 DIAGNOSIS — K7460 Unspecified cirrhosis of liver: Secondary | ICD-10-CM

## 2015-11-10 LAB — CBC WITH DIFFERENTIAL/PLATELET
BASOS ABS: 0 10*3/uL (ref 0.0–0.1)
Basophils Relative: 0.6 % (ref 0.0–3.0)
Eosinophils Absolute: 0.2 10*3/uL (ref 0.0–0.7)
Eosinophils Relative: 2.9 % (ref 0.0–5.0)
HEMATOCRIT: 37.3 % (ref 36.0–46.0)
HEMOGLOBIN: 12.3 g/dL (ref 12.0–15.0)
LYMPHS PCT: 16.6 % (ref 12.0–46.0)
Lymphs Abs: 0.9 10*3/uL (ref 0.7–4.0)
MCHC: 32.9 g/dL (ref 30.0–36.0)
MCV: 85.8 fl (ref 78.0–100.0)
Monocytes Absolute: 0.5 10*3/uL (ref 0.1–1.0)
Monocytes Relative: 9 % (ref 3.0–12.0)
Neutro Abs: 3.7 10*3/uL (ref 1.4–7.7)
Neutrophils Relative %: 70.9 % (ref 43.0–77.0)
Platelets: 168 10*3/uL (ref 150.0–400.0)
RBC: 4.35 Mil/uL (ref 3.87–5.11)
RDW: 15.7 % — ABNORMAL HIGH (ref 11.5–15.5)
WBC: 5.3 10*3/uL (ref 4.0–10.5)

## 2015-11-10 LAB — COMPREHENSIVE METABOLIC PANEL
ALBUMIN: 3.9 g/dL (ref 3.5–5.2)
ALT: 11 U/L (ref 0–35)
AST: 20 U/L (ref 0–37)
Alkaline Phosphatase: 53 U/L (ref 39–117)
BUN: 13 mg/dL (ref 6–23)
CALCIUM: 10.4 mg/dL (ref 8.4–10.5)
CO2: 26 mEq/L (ref 19–32)
CREATININE: 1.2 mg/dL (ref 0.40–1.20)
Chloride: 100 mEq/L (ref 96–112)
GFR: 56.95 mL/min — ABNORMAL LOW (ref >60.00)
Glucose, Bld: 101 mg/dL — ABNORMAL HIGH (ref 70–99)
Potassium: 3.9 mEq/L (ref 3.5–5.1)
Sodium: 134 mEq/L — ABNORMAL LOW (ref 135–145)
Total Bilirubin: 0.4 mg/dL (ref 0.2–1.2)
Total Protein: 8.7 g/dL — ABNORMAL HIGH (ref 6.0–8.3)

## 2015-11-10 LAB — PROTIME-INR
INR: 1 ratio (ref 0.8–1.0)
PROTHROMBIN TIME: 11.2 s (ref 9.6–13.1)

## 2015-11-10 MED ORDER — NA SULFATE-K SULFATE-MG SULF 17.5-3.13-1.6 GM/177ML PO SOLN: 1.0000 | Freq: Once | ORAL | Status: AC

## 2015-11-10 NOTE — Progress Notes (Signed)
HPI: This is a  very pleasant 69 year old woman   who was referred to me by Biagio Borg, MD to evaluate  recent diagnosed cirrhosis   Chief complaint is cirrhosis  March 2016 ultrasound showed cirrhotic appearing liver and is suspicious liver mass. Follow-up MRI of the liver March 2016 confirmed the cirrhosis and was diagnostic for 1.5 cm right hepatic lobe hepatocellular cancer.  Seen by Dr. Burr Medico from oncology and eventually she underwent CT-guided thermal ablation of her liver cancer on 05/14/2015 by Dr. Kathlene Cote. She tolerated the procedure very well. Her repeated scan in July showed good response.   Labs 02/2015:  Hepatitis be surface antigen negative, hepatitis B core IgM negative, hepatitis C antibody negative, hepatitis A total antibody negative. HIV negative.  Labs 9 2016: Slightly low platelets in the 140s, INR normal, LFTs normal.  Used to drink about 3-6 beers per day.  Her last etoh drink was 03/2015.  She has never had trouble with ascites, edema, GI bleeding, encephalopathy. She has never been cultured liver troubles before.  Review of systems: Pertinent positive and negative review of systems were noted in the above HPI section. Complete review of systems was performed and was otherwise normal.   Past Medical History  Diagnosis Date  . ALCOHOL ABUSE 07/13/2010  . ANXIETY 12/22/2009  . DEPRESSION 12/22/2009  . 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 FATIGUE 12/23/2010  . GALLSTONES 12/22/2009  . HYPERLIPIDEMIA 12/22/2009  . HYPERTENSION 12/22/2009  . HYPONATREMIA 12/22/2009  . 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 OSTEOPENIA 12/22/2009  . Pancytopenia 07/13/2010  . VITAMIN D DEFICIENCY 12/23/2010  . WEIGHT LOSS 12/23/2010  . Gallstones   . RENAL CYST, LEFT 12/22/2009  . Headache     occasional   . DJD (degenerative joint disease), cervical     patient denies on preop of 05/10/15     Past Surgical History  Procedure Laterality Date  . Abdominal hysterectomy      Current  Outpatient Prescriptions  Medication Sig Dispense Refill  . alendronate (FOSAMAX) 70 MG tablet Take 1 tablet (70 mg total) by mouth every 7 (seven) days. Take with a full glass of water on an empty stomach. (Patient taking differently: Take 70 mg by mouth every Wednesday. ) 12 tablet 3  . ALPRAZolam (XANAX) 0.5 MG tablet TAKE ONE TABLET BY MOUTH ONCE DAILY AT BEDTIME AS NEEDED 90 tablet 1  . amLODipine (NORVASC) 5 MG tablet Take 2 tablets (10 mg total) by mouth daily. 90 tablet 1  . Cholecalciferol (VITAMIN D3) 1000 UNITS CAPS Take 1 capsule by mouth every morning.     . citalopram (CELEXA) 20 MG tablet Take 0.5 tablets (10 mg total) by mouth daily. 90 tablet 1  . folic acid (FOLVITE) 1 MG tablet TAKE ONE TABLET BY MOUTH ONCE DAILY 90 tablet 3  . hydrALAZINE (APRESOLINE) 100 MG tablet Take 1 tablet (100 mg total) by mouth 3 (three) times daily. 270 tablet 3  . ibuprofen (ADVIL,MOTRIN) 200 MG tablet Take 1-2 tablets (200-400 mg total) by mouth every 6 (six) hours as needed for fever, headache, mild pain or moderate pain. 30 tablet 0  . isosorbide dinitrate (ISORDIL) 10 MG tablet Take 1 tablet (10 mg total) by mouth 3 (three) times daily with meals. 270 tablet 3  . ketotifen (ZADITOR) 0.025 % ophthalmic solution Place 1 drop into both eyes 2 (two) times daily.    Marland Kitchen lactulose (CHRONULAC) 10 GM/15ML solution Take 30 mLs (20 g total) by  mouth daily. 240 mL 0  . lactulose (CHRONULAC) 10 GM/15ML solution TAKE 30MLS BY MOUTH EVERY DAY 2700 mL 1  . Multiple Vitamin (MULTIVITAMIN) tablet Take 1 tablet by mouth every morning.     Vladimir Faster Glycol-Propyl Glycol (SYSTANE OP) Place 1 drop into both eyes every morning.     . propranolol (INDERAL) 20 MG tablet TAKE ONE TABLET BY MOUTH TWICE DAILY 60 tablet 6   No current facility-administered medications for this visit.    Allergies as of 11/10/2015  . (No Known Allergies)    Family History  Problem Relation Age of Onset  . Breast cancer Sister   .  Lung cancer Brother   . Gastric cancer Sister   . Lung cancer Sister   . Breast cancer Other     Social History   Social History  . Marital Status: Widowed    Spouse Name: N/A  . Number of Children: 4  . Years of Education: N/A   Occupational History  . retired    Social History Main Topics  . Smoking status: Never Smoker   . Smokeless tobacco: Never Used  . Alcohol Use: 1.8 - 2.4 oz/week    3-4 Cans of beer per week     Comment: she quit 02/2015   . Drug Use: No  . Sexual Activity: Not on file   Other Topics Concern  . Not on file   Social History Narrative   Widowed, grown son lives with her   Retired from Manufacturing engineer   Has total of #5 children   Enjoys watching TV and planting flowers     Physical Exam: BP 168/78 mmHg  Pulse 64  Ht 5\' 3"  (1.6 m)  Wt 128 lb 4 oz (58.174 kg)  BMI 22.72 kg/m2 Constitutional: generally well-appearing Psychiatric: alert and oriented x3 Eyes: extraocular movements intact Mouth: oral pharynx moist, no lesions Neck: supple no lymphadenopathy Cardiovascular: heart regular rate and rhythm Lungs: clear to auscultation bilaterally Abdomen: soft, nontender, nondistended, no obvious ascites, no peritoneal signs, normal bowel sounds Extremities: no lower extremity edema bilaterally Skin: no lesions on visible extremities   Assessment and plan: 69 y.o. female with  well compensated cirrhosis, small hepatocellular cancer  Clinically and I laboratory parameters she has well compensated cirrhosis. She needs screening for variceal, portal hypertensive problems of her upper GI tract. I would like to get her up-to-date on colon cancer screening as well with colonoscopy at the same time. The etiology of her cirrhosis is likely from alcohol but I would like to get some further blood testing checked for autoimmune liver disease and other. She understands it Tylenol at maximum 2 g per day is safer than NSAIDs. She also understands  that she should be on a relatively low-salt diet. She will return to see me in 6 months in the office and sooner if needed.   Owens Loffler, MD [facility] 11/10/2015, 11:29 AM  Cc: Biagio Borg, MD

## 2015-11-10 NOTE — Patient Instructions (Signed)
It is important that you have a relatively low salt diet.  High salt diet can cause fluid to accumulate in your legs, abdomen and even around your lungs. You should try to avoid NSAID type over the counter pain medicines as best as possible. Tylenol is safe to take for 'routine' aches and pains, but never take more than 1/2 the dose suggested on the package instructions (never more than 2 grams per day). Avoid alcohol. You will have labs checked today in the basement lab.  Please head down after you check out with the front desk  (cbc, cmet, inr, ANA, AMA, ASMA). You will be set up for an upper endoscopy to screen for esophageal varices. You will be set up for a colonoscopy for colon cancer screening. Please return to see Dr. Ardis Hughs in 6 months.

## 2015-11-12 ENCOUNTER — Other Ambulatory Visit: Payer: Self-pay | Admitting: Internal Medicine

## 2015-11-12 LAB — ANA: Anti Nuclear Antibody(ANA): POSITIVE — AB

## 2015-11-12 LAB — MITOCHONDRIAL ANTIBODIES: Mitochondrial M2 Ab, IgG: 1.38 — ABNORMAL HIGH (ref <0.91)

## 2015-11-12 LAB — ANTI-NUCLEAR AB-TITER (ANA TITER): ANA Titer 1: 1:320 {titer} — ABNORMAL HIGH

## 2015-11-12 LAB — ANTI-SMOOTH MUSCLE ANTIBODY, IGG: Smooth Muscle Ab: 9 U (ref <20)

## 2015-11-18 DIAGNOSIS — L659 Nonscarring hair loss, unspecified: Secondary | ICD-10-CM | POA: Diagnosis not present

## 2015-11-18 DIAGNOSIS — L218 Other seborrheic dermatitis: Secondary | ICD-10-CM | POA: Diagnosis not present

## 2015-11-24 DIAGNOSIS — Z87898 Personal history of other specified conditions: Secondary | ICD-10-CM | POA: Diagnosis not present

## 2015-11-24 DIAGNOSIS — R768 Other specified abnormal immunological findings in serum: Secondary | ICD-10-CM | POA: Diagnosis not present

## 2015-11-24 DIAGNOSIS — K7460 Unspecified cirrhosis of liver: Secondary | ICD-10-CM | POA: Diagnosis not present

## 2015-11-24 DIAGNOSIS — E222 Syndrome of inappropriate secretion of antidiuretic hormone: Secondary | ICD-10-CM | POA: Diagnosis not present

## 2015-11-24 DIAGNOSIS — C220 Liver cell carcinoma: Secondary | ICD-10-CM | POA: Diagnosis not present

## 2015-11-24 DIAGNOSIS — L659 Nonscarring hair loss, unspecified: Secondary | ICD-10-CM | POA: Diagnosis not present

## 2015-12-21 ENCOUNTER — Encounter: Payer: Medicare Other | Admitting: Gastroenterology

## 2015-12-21 ENCOUNTER — Ambulatory Visit (AMBULATORY_SURGERY_CENTER): Payer: Self-pay | Admitting: *Deleted

## 2015-12-21 VITALS — Ht 63.0 in | Wt 128.0 lb

## 2015-12-21 DIAGNOSIS — K7030 Alcoholic cirrhosis of liver without ascites: Secondary | ICD-10-CM

## 2015-12-21 DIAGNOSIS — Z1211 Encounter for screening for malignant neoplasm of colon: Secondary | ICD-10-CM

## 2015-12-21 NOTE — Progress Notes (Signed)
No egg or soy allergy known to patient  No issues with past sedation with any surgeries  or procedures, no intubation problems  No diet pills per patient  No home 02 use per patient  Pt was scheduled for a colon/egd today and did not prep. PV completed with daughter and pt. Al;l questions answered. RS for 1-6 Friday at 1030, to arrive at 0930 am

## 2015-12-24 ENCOUNTER — Telehealth: Payer: Self-pay

## 2015-12-24 ENCOUNTER — Encounter: Payer: Self-pay | Admitting: Gastroenterology

## 2015-12-24 ENCOUNTER — Ambulatory Visit (AMBULATORY_SURGERY_CENTER): Payer: Medicare Other | Admitting: Gastroenterology

## 2015-12-24 VITALS — BP 194/91 | HR 66 | Temp 95.8°F | Resp 19 | Ht 63.0 in | Wt 128.0 lb

## 2015-12-24 DIAGNOSIS — I8500 Esophageal varices without bleeding: Secondary | ICD-10-CM | POA: Diagnosis not present

## 2015-12-24 DIAGNOSIS — Z1211 Encounter for screening for malignant neoplasm of colon: Secondary | ICD-10-CM | POA: Diagnosis not present

## 2015-12-24 DIAGNOSIS — K7030 Alcoholic cirrhosis of liver without ascites: Secondary | ICD-10-CM | POA: Diagnosis not present

## 2015-12-24 DIAGNOSIS — K2970 Gastritis, unspecified, without bleeding: Secondary | ICD-10-CM | POA: Diagnosis not present

## 2015-12-24 DIAGNOSIS — K2950 Unspecified chronic gastritis without bleeding: Secondary | ICD-10-CM | POA: Diagnosis not present

## 2015-12-24 MED ORDER — SODIUM CHLORIDE 0.9 % IV SOLN: 500.0000 mL | INTRAVENOUS | Status: DC

## 2015-12-24 NOTE — Op Note (Signed)
[place]  [address]. [place], [address]   ENDOSCOPY PROCEDURE REPORT  PATIENT: Sarah, Harmon  MR#: [id number] BIRTHDATE: July 21, 1946 , 69  yrs. old GENDER: female ENDOSCOPIST: Milus Banister, MD PROCEDURE DATE:  12/24/2015 PROCEDURE:  EGD w/ biopsy ASA CLASS:     Class III INDICATIONS:  cirrhosis, screening for varices. MEDICATIONS: Monitored anesthesia care, Residual sedation present, and Propofol 100 mg IV TOPICAL ANESTHETIC: none  DESCRIPTION OF PROCEDURE: After the risks benefits and alternatives of the procedure were thoroughly explained, informed consent was obtained.  The [id number] endoscope was introduced through the mouth and advanced to the second portion of the duodenum , Without limitations.  The instrument was slowly withdrawn as the mucosa was fully examined.    There was mild to moderate erosive gastritis (mid and distal stomach).  This was not classic appearing for portal gastropathy. Biopsies were taken from antrum and body to check for H.  pylori, portal gastropathy, GAVE.  The examination was otherwise normal. There were no gastric or esophageal varices.  Retroflexed views revealed no abnormalities.     The scope was then withdrawn from the patient and the procedure completed.  COMPLICATIONS: There were no immediate complications.  ENDOSCOPIC IMPRESSION: There was mild to moderate erosive gastritis (mid and distal stomach).  This was not classic appearing for portal gastropathy. Biopsies were taken from antrum and body to check for H.  pylori, portal gastropathy, GAVE.  The examination was otherwise normal. There were no gastric or esophageal varices  RECOMMENDATIONS: Await biopsy results My office will call to schedule return office visit in 2-3 months, also to check on your referral to rheumatology Dr. Amil Amen that we had tried to set up 1-2 months ago.  eSigned:  Milus Banister, MD 12/24/2015 10:52  AM

## 2015-12-24 NOTE — Telephone Encounter (Signed)
My office will call to schedule return office visit in 2-3 months, also to check on your referral to rheumatology Dr. Amil Amen that we had tried to set up 1-2 months ago.

## 2015-12-24 NOTE — Patient Instructions (Signed)

## 2015-12-24 NOTE — Progress Notes (Signed)
Called to room to assist during endoscopic procedure.  Patient ID and intended procedure confirmed with present staff. Received instructions for my participation in the procedure from the performing physician.  

## 2015-12-24 NOTE — Progress Notes (Signed)
Pt. Blood pressure 245/111rt arm and 234/108 lt arm. Made CRNA Aware. They will do procedure since pt. Is being treated for high blood pressure,but she stated that she didn't take her blood pressure medication today. Pt. Denies any s/s related to her blood pressure.

## 2015-12-24 NOTE — Progress Notes (Signed)
Dental advisory given to patient 

## 2015-12-24 NOTE — Progress Notes (Signed)
A/ox3 pleased with MAC, report to Wendy RN 

## 2015-12-24 NOTE — Telephone Encounter (Signed)
Verbal per Dr Ardis Hughs only the report from Dr Valora Piccolo visit is needed.  Call placed to Dr Amil Amen and report will be faxed today and placed on Dr Ardis Hughs desk.

## 2015-12-24 NOTE — Op Note (Signed)
[place]  [address] [place], [address]   COLONOSCOPY PROCEDURE REPORT  PATIENT: Sarah, Harmon  MR#: [id number] BIRTHDATE: October 29, 1946 , 69  yrs. old GENDER: female ENDOSCOPIST: Milus Banister, MD PROCEDURE DATE:  12/24/2015 PROCEDURE:   Colonoscopy, screening First Screening Colonoscopy - Avg.  risk and is 50 yrs.  old or older Yes.  Prior Negative Screening - Now for repeat screening. N/A  History of Adenoma - Now for follow-up colonoscopy & has been > or = to 3 yrs.  N/A  Recommend repeat exam, <10 yrs? No ASA CLASS:   Class III INDICATIONS:Screening for colonic neoplasia and Colorectal Neoplasm Risk Assessment for this procedure is average risk. MEDICATIONS: Monitored anesthesia care and Propofol 200 mg IV  DESCRIPTION OF PROCEDURE:   After the risks benefits and alternatives of the procedure were thoroughly explained, informed consent was obtained.  The digital rectal exam revealed no abnormalities of the rectum.   The LB [id number]  endoscope was introduced through the anus and advanced to the cecum, which was identified by both the appendix and ileocecal valve. No adverse events experienced.   The quality of the prep was excellent.  The instrument was then slowly withdrawn as the colon was fully examined. Estimated blood loss is zero unless otherwise noted in this procedure report.   COLON FINDINGS: There was mild diverticulosis noted in the left colon.   The examination was otherwise normal.  Retroflexed views revealed no abnormalities. The time to cecum = 4.7 Withdrawal time = 7.9   The scope was withdrawn and the procedure completed. COMPLICATIONS: There were no immediate complications.  ENDOSCOPIC IMPRESSION: 1.   Mild diverticulosis was noted in the left colon 2.   The examination was otherwise normal  RECOMMENDATIONS: You should continue to follow colorectal cancer screening guidelines for "routine risk" patients with a repeat  colonoscopy in 10 years. There is no need for FOBT (stool) testing for at least 5 years.  eSigned:  Milus Banister, MD 12/24/2015 10:42 AM

## 2015-12-28 ENCOUNTER — Other Ambulatory Visit: Payer: Self-pay | Admitting: *Deleted

## 2015-12-28 ENCOUNTER — Telehealth: Payer: Self-pay | Admitting: Gastroenterology

## 2015-12-28 ENCOUNTER — Telehealth: Payer: Self-pay | Admitting: *Deleted

## 2015-12-28 ENCOUNTER — Other Ambulatory Visit (HOSPITAL_COMMUNITY): Payer: Self-pay | Admitting: Interventional Radiology

## 2015-12-28 DIAGNOSIS — C220 Liver cell carcinoma: Secondary | ICD-10-CM

## 2015-12-28 NOTE — Telephone Encounter (Signed)
  Follow up Call-  Call back number 12/24/2015  Post procedure Call Back phone  # [phone]  Permission to leave phone message No  comments NO ANSWERIN MACHINE     Patient questions:  Do you have a fever, pain , or abdominal swelling? No. Pain Score  0 *  Have you tolerated food without any problems? Yes.    Have you been able to return to your normal activities? Yes.    Do you have any questions about your discharge instructions: Diet   No. Medications  No. Follow up visit  No.  Do you have questions or concerns about your Care? No.  Actions: * If pain score is 4 or above: No action needed, pain <4.

## 2015-12-28 NOTE — Telephone Encounter (Signed)
Office visit in rheumatology, Dr. Amil Amen December 2016; he did not feel that she had any features of connective tissue disease or inflammatory arthritis to explain her positive ANA. Repeat labs in the office showed a negative ENA panel. He felt that her autoimmune markers may indeed be a sign of underlying autoimmune hepatitis.  IgG Quant 2155 (normal range [phone]), IgA level normal, IgM level normal, IgE level 3016 (normal range 0-100).    gamma globulin total 2.0 (range 0.4-1.8) globulin total 4.5 (2.2-3.9).  Her transaminases are normal. 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 I'm going to discuss her case with a dedicated hepatologist to help decide whether this warrants treatment

## 2016-01-06 NOTE — Telephone Encounter (Signed)
Pt has been notified and ROV scheduled

## 2016-01-06 NOTE — Telephone Encounter (Signed)
I spoke with Dr. Lurlean Leyden from [facility] hepatology.  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   Recommended I follow liver tests IgG levels periodically and only initiate treatment if those are sigfnicantly elevated  Patty, Can you call her.  Biopsies from stomach showed no sign of infection, cancer.  She needs rov with me in 3 months for general cirrhosis care.   Santiago Glad, no result note is needed.

## 2016-01-15 DIAGNOSIS — C220 Liver cell carcinoma: Secondary | ICD-10-CM | POA: Diagnosis not present

## 2016-01-15 LAB — COMPLETE METABOLIC PANEL WITH GFR
ALBUMIN: 3.4 g/dL — AB (ref 3.6–5.1)
ALT: 12 U/L (ref 6–29)
AST: 22 U/L (ref 10–35)
Alkaline Phosphatase: 48 U/L (ref 33–130)
BILIRUBIN TOTAL: 0.4 mg/dL (ref 0.2–1.2)
BUN: 12 mg/dL (ref 7–25)
CALCIUM: 9.4 mg/dL (ref 8.6–10.4)
CO2: 27 mmol/L (ref 20–31)
Chloride: 95 mmol/L — ABNORMAL LOW (ref 98–110)
Creat: 1.24 mg/dL — ABNORMAL HIGH (ref 0.60–0.93)
GFR, Est African American: 50 mL/min — ABNORMAL LOW (ref >=60)
GFR, Est Non African American: 44 mL/min — ABNORMAL LOW (ref >=60)
GLUCOSE: 98 mg/dL (ref 65–99)
POTASSIUM: 4.6 mmol/L (ref 3.5–5.3)
Sodium: 131 mmol/L — ABNORMAL LOW (ref 135–146)
Total Protein: 7.8 g/dL (ref 6.1–8.1)

## 2016-01-17 LAB — AFP TUMOR MARKER: AFP-Tumor Marker: 7.8 ng/mL — ABNORMAL HIGH (ref <6.1)

## 2016-01-25 ENCOUNTER — Ambulatory Visit
Admission: RE | Admit: 2016-01-25 | Discharge: 2016-01-25 | Disposition: A | Discharge status: Home / Self Care | Payer: Medicare Other | Source: Ambulatory Visit | Attending: Interventional Radiology | Admitting: Interventional Radiology

## 2016-01-25 ENCOUNTER — Ambulatory Visit (HOSPITAL_COMMUNITY)
Admission: RE | Admit: 2016-01-25 | Discharge: 2016-01-25 | Disposition: A | Discharge status: Home / Self Care | Payer: Medicare Other | Source: Ambulatory Visit | Attending: Interventional Radiology | Admitting: Interventional Radiology

## 2016-01-25 DIAGNOSIS — K7460 Unspecified cirrhosis of liver: Secondary | ICD-10-CM | POA: Diagnosis not present

## 2016-01-25 DIAGNOSIS — K8020 Calculus of gallbladder without cholecystitis without obstruction: Secondary | ICD-10-CM | POA: Insufficient documentation

## 2016-01-25 DIAGNOSIS — C220 Liver cell carcinoma: Secondary | ICD-10-CM | POA: Diagnosis present

## 2016-01-25 MED ORDER — GADOXETATE DISODIUM 0.25 MMOL/ML IV SOLN
5.0000 mL | Freq: Once | INTRAVENOUS | Status: AC | PRN
  Administered 2016-01-25: 5 mL via INTRAVENOUS

## 2016-01-25 NOTE — Progress Notes (Signed)
Referring Physician(s): Yamagata,Glenn  Chief Complaint: The patient is seen in follow up today s/p CT guided percutaneous thermal abblation of the the right lobe hepatocellular carcinoma done by Dr. Kathlene Cote 05/14/2015  History of present illness:  Sarah Harmon is here today for f/u after CT guided percutaneous thermal abblation of the the right lobe hepatocellular carcinoma done by Dr. Kathlene Cote 05/14/2015  She is doing pretty well.  She denies any recent illness, fever or chills.  She denies nausea, vomiting, RUQ pain.  She states her appetite is "Ok" but could be better. She denies any unexpected weight loss.  She had her f/u MRI this morning and it shows further decrease in size of ablation defect within segment 8 of the liver compatible with treated tumor. There are no new enhancing liver lesions identified. She has known cirrhosis   There is a previously noted gallstone has now migrated into the distal portion of the cystic duct and there is progressive distention of the gallbladder lumen as well as mild biliary dilatation.  She also asked her kidney and her Bosniak category 2 Fahrenheit lesion arising from upper pole of the left kidney is unchanged.   Her LFTs are normal.  Her AFP is down to 7.8 from 9.4.  Past Medical History  Diagnosis Date  . ALCOHOL ABUSE 07/13/2010  . ANXIETY 12/22/2009  . DEPRESSION 12/22/2009  . 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 FATIGUE 12/23/2010  . GALLSTONES 12/22/2009  . HYPERLIPIDEMIA 12/22/2009  . HYPERTENSION 12/22/2009  . HYPONATREMIA 12/22/2009  . 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 OSTEOPENIA 12/22/2009  . Pancytopenia 07/13/2010  . VITAMIN D DEFICIENCY 12/23/2010  . WEIGHT LOSS 12/23/2010  . Gallstones   . RENAL CYST, LEFT 12/22/2009  . Headache     occasional   . DJD (degenerative joint disease), cervical     patient denies on preop of 05/10/15     Past Surgical History  Procedure Laterality Date  . Abdominal hysterectomy       Allergies: Review of patient's allergies indicates no known allergies.  Medications: Prior to Admission medications   Medication Sig Start Date End Date Taking? Authorizing Provider  alendronate (FOSAMAX) 70 MG tablet Take 1 tablet (70 mg total) by mouth every 7 (seven) days. Take with a full glass of water on an empty stomach. Patient taking differently: Take 70 mg by mouth every Wednesday.  02/26/15  Yes Biagio Borg, MD  ALPRAZolam Duanne Moron) 0.5 MG tablet TAKE ONE TABLET BY MOUTH ONCE DAILY AT BEDTIME AS NEEDED 10/27/15  Yes Biagio Borg, MD  amLODipine (NORVASC) 5 MG tablet Take 2 tablets (10 mg total) by mouth daily. 08/31/15 08/30/16 Yes Biagio Borg, MD  Cholecalciferol (VITAMIN D3) 1000 UNITS CAPS Take 1 capsule by mouth every morning.    Yes Historical Provider, MD  citalopram (CELEXA) 20 MG tablet Take 0.5 tablets (10 mg total) by mouth daily. 08/31/15  Yes Biagio Borg, MD  folic acid (FOLVITE) 1 MG tablet TAKE ONE TABLET BY MOUTH ONCE DAILY 05/21/15  Yes Biagio Borg, MD  hydrALAZINE (APRESOLINE) 100 MG tablet Take 1 tablet (100 mg total) by mouth 3 (three) times daily. 05/03/15  Yes Biagio Borg, MD  ibuprofen (ADVIL,MOTRIN) 200 MG tablet Take 1-2 tablets (200-400 mg total) by mouth every 6 (six) hours as needed for fever, headache, mild pain or moderate pain. 03/07/15  Yes Cherene Altes, MD  isosorbide dinitrate (ISORDIL) 10 MG tablet Take 1 tablet (10 mg total)  by mouth 3 (three) times daily with meals. 05/03/15  Yes Biagio Borg, MD  ketoconazole (NIZORAL) 2 % shampoo  11/19/15  Yes Historical Provider, MD  ketotifen (ZADITOR) 0.025 % ophthalmic solution Place 1 drop into both eyes 2 (two) times daily.   Yes Historical Provider, MD  lactulose (CHRONULAC) 10 GM/15ML solution Take 30 mLs (20 g total) by mouth daily. 03/07/15  Yes Cherene Altes, MD  Multiple Vitamin (MULTIVITAMIN) tablet Take 1 tablet by mouth every morning.    Yes Historical Provider, MD  Polyethyl Glycol-Propyl  Glycol (SYSTANE OP) Place 1 drop into both eyes every morning.    Yes Historical Provider, MD  propranolol (INDERAL) 20 MG tablet TAKE ONE TABLET BY MOUTH TWICE DAILY 11/12/15  Yes Biagio Borg, MD  FLUOCINOLONE ACETONIDE SCALP 0.01 % OIL Reported on 01/25/2016 11/21/15   Historical Provider, MD     Family History  Problem Relation Age of Onset  . Breast cancer Sister   . Lung cancer Brother   . Gastric cancer Sister   . Lung cancer Sister   . Breast cancer Other   . Colon cancer Neg Hx   . Colon polyps Neg Hx   . Rectal cancer Neg Hx   . Stomach cancer Neg Hx   . Esophageal cancer Neg Hx     Social History   Social History  . Marital Status: Widowed    Spouse Name: N/A  . Number of Children: 4  . Years of Education: N/A   Occupational History  . retired    Social History Main Topics  . Smoking status: Never Smoker   . Smokeless tobacco: Never Used  . Alcohol Use: 1.8 - 2.4 oz/week    3-4 Cans of beer per week     Comment: she quit 02/2015   . Drug Use: No  . Sexual Activity: Not on file   Other Topics Concern  . Not on file   Social History Narrative   Widowed, grown son lives with her   Retired from Manufacturing engineer   Has total of #5 children   Enjoys watching TV and planting flowers     Vital Signs: BP 229/100 mmHg  Pulse 71  Temp(Src) 98.8 F (37.1 C) (Oral)  Resp 14  Ht 5\' 3"  (1.6 m)  Wt 125 lb (56.7 kg)  BMI 22.15 kg/m2  SpO2 98%  Physical Exam  Constitutional: She is oriented to person, place, and time. She appears well-developed and well-nourished.  HENT:  Head: Normocephalic and atraumatic.  Eyes: EOM are normal. No scleral icterus.  Neck: Neck supple.  Cardiovascular: Normal rate and regular rhythm.   Murmur heard. Pulmonary/Chest: Effort normal and breath sounds normal. No respiratory distress. She has no wheezes.  Abdominal: Soft. Bowel sounds are normal. She exhibits no distension. There is no tenderness.  Musculoskeletal:  Normal range of motion.  Neurological: She is alert and oriented to person, place, and time. No cranial nerve deficit.  Skin: Skin is warm and dry.  Psychiatric: She has a normal mood and affect. Her behavior is normal. Judgment and thought content normal.  Vitals reviewed.   Imaging: Mr Abdomen W Wo Contrast  01/25/2016  CLINICAL DATA:  Status post thermal ablation of right lobe of liver hepatocellular carcinoma EXAM: MRI ABDOMEN WITHOUT AND WITH CONTRAST TECHNIQUE: Multiplanar multisequence MR imaging of the abdomen was performed both before and after the administration of intravenous contrast. CONTRAST:  5 cc Eovist COMPARISON:  07/13/2015 FINDINGS: Lower chest:  No pleural or pericardial fluid identified. Hepatobiliary: Morphologic features of liver are identified compatible with cirrhosis. The ablation defect within segment 8 of the liver measures 2.3 x 0.9 cm, image 32 of series 501. On the previous exam this measured 2.6 x 1.7 cm. There is no abnormal enhancement associated with the ablation defect to suggest local tumor recurrence. No new enhancing liver abnormalities identified. The previously noted stone within the dependent portion of the gallbladder has migrated into the distal portion of the cystic duct where it measures 1.3 cm. The gallbladder appears more distended than on the previous exam and there is mild increase in caliber of the CBD. Pancreas: Negative Spleen: Negative Adrenals/Urinary Tract: Normal adrenal glands. No change in cyst with thin internal septations arising from upper pole of left kidney. No significant enhancement associated with the internal septations. Stomach/Bowel: There is no pathologic dilatation of the stomach or upper abdominal bowel loops. Vascular/Lymphatic: Atherosclerotic calcifications involving the abdominal aorta noted. There is no aneurysm. No upper abdominal adenopathy identified. Other: No free fluid or fluid collections identified within the upper  abdomen. Musculoskeletal: No abnormal enhancement identified within the visualized osseous structures. IMPRESSION: 1. Further decrease in size of ablation defect within segment 8 of the liver compatible with treated tumor. 2. No new enhancing liver lesions identified. 3. Cirrhosis 4. Previously noted gallstone has now migrated into the distal portion of the cystic duct and there is progressive distention of the gallbladder lumen as well as mild biliary dilatation. 5. Bosniak category 2 Fahrenheit lesion arising from upper pole of the left kidney is unchanged. Followup imaging in 12 months is recommended. Electronically Signed   By: Kerby Moors M.D.   On: 01/25/2016 09:28    Labs:  CBC:  Recent Labs  05/10/15 0830 05/15/15 0420 09/16/15 0919 11/10/15 1218  WBC 5.3 6.2 3.6* 5.3  HGB 11.4* 10.3* 11.5* 12.3  HCT 35.5* 31.6* 35.2 37.3  PLT 186 149* 133* 168.0    COAGS:  Recent Labs  02/28/15 0335 03/01/15 0352 05/10/15 0830 11/10/15 1218  INR 1.04 1.11 1.06 1.0  APTT  --   --  33  --     BMP:  Recent Labs  05/10/15 0830 05/15/15 0420 07/05/15 0803 09/16/15 0919 11/10/15 1218 01/15/16 0820  NA 138 133* 133* 134* 134* 131*  K 4.8 3.6 4.6 5.0 3.9 4.6  CL 100* 98* 99  --  100 95*  CO2 29 25 25 23 26 27   GLUCOSE 139* 106* 99 120 101* 98  BUN 16 11 21  13.7 13 12   CALCIUM 10.2 9.1 9.9 9.6 10.4 9.4  CREATININE 1.46* 1.23* 1.43* 1.3* 1.20 1.24*  GFRNONAA 35* 43* 37*  --   --  44*  GFRAA 41* 50* 43*  --   --  50*    LIVER FUNCTION TESTS:  Recent Labs  07/05/15 0803 09/16/15 0919 11/10/15 1218 01/15/16 0820  BILITOT 0.3 0.30 0.4 0.4  AST 19 24 20 22   ALT 11 11 11 12   ALKPHOS 53 59 53 48  PROT 8.1 8.2 8.7* 7.8  ALBUMIN 3.6 3.3* 3.9 3.4*    Assessment:  S/P CT guided percutaneous thermal abblation of the the right lobe hepatocellular carcinoma done by Dr. Kathlene Cote 05/14/2015 with decrease in the size of the tumor on MRI today.  AFT down to 7.8 from 9.4. LFTs  normal.  Gallbladder distension with stone now in the cystic duct.  Patient is asymptomatic.  Dr. Kathlene Cote also saw this patient today.  Will refer to Dr. Barry Dienes for evaluation of the gallbladder to consider possible cholecystectomy.  Will have her return in 6 months with repeat MRI, CMP, and AFP.  Signed: Murrell Redden PA-C 01/25/2016, 11:01 AM   Please refer to Dr. Kathlene Cote attestation of this note for management and plan.

## 2016-01-31 DIAGNOSIS — C220 Liver cell carcinoma: Secondary | ICD-10-CM | POA: Diagnosis not present

## 2016-01-31 DIAGNOSIS — K8020 Calculus of gallbladder without cholecystitis without obstruction: Secondary | ICD-10-CM | POA: Diagnosis not present

## 2016-02-09 ENCOUNTER — Other Ambulatory Visit: Payer: Self-pay | Admitting: Internal Medicine

## 2016-03-01 ENCOUNTER — Encounter: Payer: Self-pay | Admitting: Internal Medicine

## 2016-03-01 ENCOUNTER — Ambulatory Visit (INDEPENDENT_AMBULATORY_CARE_PROVIDER_SITE_OTHER): Payer: Medicare Other | Admitting: Internal Medicine

## 2016-03-01 VITALS — BP 142/84 | HR 65 | Temp 98.2°F | Resp 20 | Wt 129.0 lb

## 2016-03-01 DIAGNOSIS — F411 Generalized anxiety disorder: Secondary | ICD-10-CM

## 2016-03-01 DIAGNOSIS — I10 Essential (primary) hypertension: Secondary | ICD-10-CM

## 2016-03-01 DIAGNOSIS — E785 Hyperlipidemia, unspecified: Secondary | ICD-10-CM | POA: Diagnosis not present

## 2016-03-01 DIAGNOSIS — R7302 Impaired glucose tolerance (oral): Secondary | ICD-10-CM

## 2016-03-01 NOTE — Progress Notes (Signed)
Pre visit review using our clinic review tool, if applicable. No additional management support is needed unless otherwise documented below in the visit note. 

## 2016-03-01 NOTE — Patient Instructions (Signed)
Please continue all other medications as before, and refills have been done if requested.  Please have the pharmacy call with any other refills you may need.  Please continue your efforts at being more active, low cholesterol diet, and weight control.  You are otherwise up to date with prevention measures today.  Please keep your appointments with your specialists as you may have planned  No further lab work is felt needed today  Please return in 3 months, or sooner if needed

## 2016-03-01 NOTE — Assessment & Plan Note (Signed)
stable overall by history and exam, recent data reviewed with pt, and pt to continue medical treatment as before,  to f/u any worsening symptoms or concerns  

## 2016-03-01 NOTE — Assessment & Plan Note (Signed)
stable overall by history and exam, recent data reviewed with pt, and pt to continue medical treatment as before,  to f/u any worsening symptoms or concerns BP Readings from Last 3 Encounters:  03/01/16 142/84  01/25/16 229/100  12/24/15 194/91

## 2016-03-01 NOTE — Assessment & Plan Note (Signed)
stable overall by history and exam, recent data reviewed with pt, and pt to continue medical treatment as before,  to f/u any worsening symptoms or concerns Lab Results  Component Value Date   HGBA1C 5.8 02/26/2015    

## 2016-03-01 NOTE — Assessment & Plan Note (Signed)
stable overall by history and exam, recent data reviewed with pt, and pt to continue medical treatment as before,  to f/u any worsening symptoms or concerns  Lab Results  Component Value Date   LDLCALC 51 02/26/2015

## 2016-03-01 NOTE — Progress Notes (Signed)
Subjective:    Patient ID: Sarah Harmon, female    DOB: Mar 15, 1946, 70 y.o.   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  Pt denies new neurological symptoms such as new headache, or facial or extremity weakness or numbness   Pt denies polydipsia, polyuria.  BP has been elevated recently for unclear reasons, denies med noncompliance.  Husband very supportive and helps.  Has gained some wt overall since stopped ETOH.Denies worsening depressive symptoms, suicidal ideation, or panic; has ongoing anxiety, BP Readings from Last 3 Encounters:  03/01/16 142/84  01/25/16 229/100  12/24/15 194/91   Past Medical History  Diagnosis Date  . ALCOHOL ABUSE 07/13/2010  . ANXIETY 12/22/2009  . DEPRESSION 12/22/2009  . 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 FATIGUE 12/23/2010  . GALLSTONES 12/22/2009  . HYPERLIPIDEMIA 12/22/2009  . HYPERTENSION 12/22/2009  . HYPONATREMIA 12/22/2009  . 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 OSTEOPENIA 12/22/2009  . Pancytopenia 07/13/2010  . VITAMIN D DEFICIENCY 12/23/2010  . WEIGHT LOSS 12/23/2010  . Gallstones   . RENAL CYST, LEFT 12/22/2009  . Headache     occasional   . DJD (degenerative joint disease), cervical     patient denies on preop of 05/10/15    Past Surgical History  Procedure Laterality Date  . Abdominal hysterectomy      reports that she has never smoked. She has never used smokeless tobacco. She reports that she drinks about 1.8 - 2.4 oz of alcohol per week. She reports that she does not use illicit drugs. family history includes Breast cancer in her other and sister; Gastric cancer in her sister; Lung cancer in her brother and  sister. There is no history of Colon cancer, Colon polyps, Rectal cancer, Stomach cancer, or Esophageal cancer. No Known Allergies Current Outpatient Prescriptions on File Prior to Visit  Medication Sig Dispense Refill  . alendronate (FOSAMAX) 70 MG tablet TAKE ONE TABLET BY MOUTH ONCE A WEEK (EVERY 7 DAYS) TAKE  WITH  FULL  GLAS  OF  WATER  ON  AN  EMPTY  STOMACH 12 tablet 0  . ALPRAZolam (XANAX) 0.5 MG tablet TAKE ONE TABLET BY MOUTH ONCE DAILY AT BEDTIME AS NEEDED 90 tablet 1  . amLODipine (NORVASC) 5 MG tablet Take 2 tablets (10 mg total) by mouth daily. 90 tablet 1  . Cholecalciferol (VITAMIN D3) 1000 UNITS CAPS Take 1 capsule by mouth every morning.     . citalopram (CELEXA) 20 MG tablet Take 0.5 tablets (10 mg total) by mouth daily. 90 tablet 1  . FLUOCINOLONE ACETONIDE SCALP 0.01 % OIL Reported on [id number]    . folic acid (FOLVITE) 1 MG tablet TAKE ONE TABLET BY MOUTH ONCE DAILY 90 tablet 3  . hydrALAZINE (APRESOLINE) 100 MG tablet Take 1 tablet (100 mg total) by mouth 3 (three) times daily. 270 tablet 3  . ibuprofen (ADVIL,MOTRIN) 200 MG tablet Take 1-2 tablets (200-400 mg total) by mouth every 6 (six) hours as needed for fever, headache, mild pain or moderate pain. 30 tablet 0  . isosorbide dinitrate (ISORDIL) 10 MG tablet Take 1 tablet (10 mg total) by mouth 3 (three) times daily with meals. 270 tablet 3  . ketoconazole (  NIZORAL) 2 % shampoo     . ketotifen (ZADITOR) 0.025 % ophthalmic solution Place 1 drop into both eyes 2 (two) times daily.    Marland Kitchen lactulose (CHRONULAC) 10 GM/15ML solution Take 30 mLs (20 g total) by mouth daily. 240 mL 0  . Multiple Vitamin (MULTIVITAMIN) tablet Take 1 tablet by mouth every morning.     Vladimir Faster Glycol-Propyl Glycol (SYSTANE OP) Place 1 drop into both eyes every morning.     . propranolol (INDERAL) 20 MG tablet TAKE ONE TABLET BY MOUTH TWICE DAILY 60 tablet 3   No current facility-administered medications on file prior to visit.    Review of  Systems  Constitutional: Negative for unusual diaphoresis or night sweats HENT: Negative for ringing in ear or discharge Eyes: Negative for double vision or worsening visual disturbance.  Respiratory: Negative for choking and stridor.   Gastrointestinal: Negative for vomiting or other signifcant bowel change Genitourinary: Negative for hematuria or change in urine volume.  Musculoskeletal: Negative for other MSK pain or swelling Skin: Negative for color change and worsening wound.  Neurological: Negative for tremors and numbness other than noted  Psychiatric/Behavioral: Negative for decreased concentration or agitation other than above       Objective:   Physical Exam BP 142/84 mmHg  Pulse 65  Temp(Src) 98.2 F (36.8 C) (Oral)  Resp 20  Wt 129 lb (58.514 kg)  SpO2 95% VS noted,  Constitutional: Pt appears in no significant distress HENT: Head: NCAT.  Right Ear: External ear normal.  Left Ear: External ear normal.  Eyes: . Pupils are equal, round, and reactive to light. Conjunctivae and EOM are normal Neck: Normal range of motion. Neck supple.  Cardiovascular: Normal rate and regular rhythm.   Pulmonary/Chest: Effort normal and breath sounds without rales or wheezing.  Abd:  Soft, NT, ND, + BS Neurological: Pt is alert. Not confused , motor grossly intact Skin: Skin is warm. No rash, no LE edema Psychiatric: Pt behavior is normal. No agitation.   Lab Results  Component Value Date   WBC 5.3 11/10/2015   HGB 12.3 11/10/2015   HCT 37.3 11/10/2015   PLT 168.0 11/10/2015   GLUCOSE 98 01/15/2016   CHOL 182 02/26/2015   TRIG 182.0* 02/26/2015   HDL 94.40 02/26/2015   LDLCALC 51 02/26/2015   ALT 12 01/15/2016   AST 22 01/15/2016   NA 131* 01/15/2016   K 4.6 01/15/2016   CL 95* 01/15/2016   CREATININE 1.24* 01/15/2016   BUN 12 01/15/2016   CO2 27 01/15/2016   TSH 1.518 03/05/2015   INR 1.0 11/10/2015   HGBA1C 5.8 02/26/2015       Assessment & Plan:

## 2016-03-08 ENCOUNTER — Other Ambulatory Visit: Payer: Self-pay

## 2016-03-08 DIAGNOSIS — Z1231 Encounter for screening mammogram for malignant neoplasm of breast: Secondary | ICD-10-CM

## 2016-03-17 ENCOUNTER — Other Ambulatory Visit (INDEPENDENT_AMBULATORY_CARE_PROVIDER_SITE_OTHER): Payer: Medicare Other

## 2016-03-17 ENCOUNTER — Encounter: Payer: Self-pay | Admitting: Gastroenterology

## 2016-03-17 ENCOUNTER — Ambulatory Visit (INDEPENDENT_AMBULATORY_CARE_PROVIDER_SITE_OTHER): Payer: Medicare Other | Admitting: Gastroenterology

## 2016-03-17 VITALS — BP 150/70 | HR 68 | Ht 63.0 in | Wt 129.0 lb

## 2016-03-17 DIAGNOSIS — K7460 Unspecified cirrhosis of liver: Secondary | ICD-10-CM | POA: Diagnosis not present

## 2016-03-17 LAB — COMPREHENSIVE METABOLIC PANEL
ALK PHOS: 53 U/L (ref 39–117)
ALT: 12 U/L (ref 0–35)
AST: 20 U/L (ref 0–37)
Albumin: 3.6 g/dL (ref 3.5–5.2)
BILIRUBIN TOTAL: 0.3 mg/dL (ref 0.2–1.2)
BUN: 14 mg/dL (ref 6–23)
CO2: 28 meq/L (ref 19–32)
CREATININE: 1.4 mg/dL — AB (ref 0.40–1.20)
Calcium: 9.6 mg/dL (ref 8.4–10.5)
Chloride: 99 mEq/L (ref 96–112)
GFR: 47.62 mL/min — AB (ref >60.00)
GLUCOSE: 87 mg/dL (ref 70–99)
Potassium: 4 mEq/L (ref 3.5–5.1)
SODIUM: 134 meq/L — AB (ref 135–145)
TOTAL PROTEIN: 7.8 g/dL (ref 6.0–8.3)

## 2016-03-17 LAB — PROTIME-INR
INR: 1 ratio (ref 0.8–1.0)
Prothrombin Time: 10.7 s (ref 9.6–13.1)

## 2016-03-17 LAB — HEPATITIS B SURFACE ANTIBODY,QUALITATIVE: Hep B S Ab: NEGATIVE

## 2016-03-17 LAB — HEPATITIS A ANTIBODY, TOTAL: Hep A Total Ab: REACTIVE — AB

## 2016-03-17 NOTE — Patient Instructions (Signed)
You will have labs checked today in the basement lab.  Please head down after you check out with the front desk  (cmet, inr, IgG quant level, hepatitis A total, Hepatitis B surface antibody).  May immunize if needed. Please return to see Dr. Ardis Hughs in 6 monhts. It is important that you have a relatively low salt diet.  High salt diet can cause fluid to accumulate in your legs, abdomen and even around your lungs. You should try to avoid NSAID type over the counter pain medicines as best as possible. Tylenol is safe to take for 'routine' aches and pains, but never take more than 1/2 the dose suggested on the package instructions (never more than 2 grams per day). Avoid alcohol.

## 2016-03-17 NOTE — Progress Notes (Signed)
Review of pertinent gastrointestinal problems: 1. 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 HIV negative. Labs 9 2016: Slightly low platelets in the 140s, INR normal, LFTs normal. Slightly elevated IgG levels.  Met with rheumatology (agree possible AIH), curbside Dr. Lurlean Leyden 2017 ; no need to treat since likely burnt out, normal LFTs.  EGD; 12/2015 gastritis that was H. Pylori neg; No varices, no portal gastropathy  2. HCC: March 2016 ultrasound showed cirrhotic appearing liver and is suspicious liver mass. Follow-up MRI of the liver March 2016 confirmed the cirrhosis and was diagnostic for 1.5 cm right hepatic lobe hepatocellular cancer. Seen by Dr. Burr Medico from oncology and eventually she underwent CT-guided thermal ablation of her liver cancer on 05/14/2015 by Dr. Kathlene Cote. She tolerated the procedure very well. Her repeated scan in July showed good response.  3. Routine risk for colon cancer: colonoscopy 12/2015 Dr. Ardis Hughs; no polyps  HPI: This is a  very pleasant 70 year old woman whom I last saw about 2 months ago the time of colonoscopy and upper endoscopy, see those results above.  She is here with her daughter today.  Chief complaint is  Cirrhosis  She has no edema, no trouble breathing, no episodes of encephalopathy. She really feels fine. No abdominal pains.     Past Medical History  Diagnosis Date  . ALCOHOL ABUSE 07/13/2010  . ANXIETY 12/22/2009  . DEPRESSION 12/22/2009  . 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 FATIGUE 12/23/2010  . GALLSTONES 12/22/2009  . HYPERLIPIDEMIA 12/22/2009  . HYPERTENSION 12/22/2009  . HYPONATREMIA 12/22/2009  . 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 OSTEOPENIA 12/22/2009  . Pancytopenia 07/13/2010  . VITAMIN D DEFICIENCY 12/23/2010  . WEIGHT LOSS 12/23/2010  . Gallstones   . RENAL CYST,  LEFT 12/22/2009  . Headache     occasional   . DJD (degenerative joint disease), cervical     patient denies on preop of 05/10/15     Past Surgical History  Procedure Laterality Date  . Abdominal hysterectomy      Current Outpatient Prescriptions  Medication Sig Dispense Refill  . alendronate (FOSAMAX) 70 MG tablet TAKE ONE TABLET BY MOUTH ONCE A WEEK (EVERY 7 DAYS) TAKE  WITH  FULL  GLAS  OF  WATER  ON  AN  EMPTY  STOMACH 12 tablet 0  . ALPRAZolam (XANAX) 0.5 MG tablet TAKE ONE TABLET BY MOUTH ONCE DAILY AT BEDTIME AS NEEDED 90 tablet 1  . amLODipine (NORVASC) 5 MG tablet Take 2 tablets (10 mg total) by mouth daily. 90 tablet 1  . Cholecalciferol (VITAMIN D3) 1000 UNITS CAPS Take 1 capsule by mouth every morning.     . citalopram (CELEXA) 20 MG tablet Take 0.5 tablets (10 mg total) by mouth daily. 90 tablet 1  . FLUOCINOLONE ACETONIDE SCALP 0.01 % OIL Reported on 05/22/7845    . folic acid (FOLVITE) 1 MG tablet TAKE ONE TABLET BY MOUTH ONCE DAILY 90 tablet 3  . hydrALAZINE (APRESOLINE) 100 MG tablet Take 1 tablet (100 mg total) by mouth 3 (three) times daily. 270 tablet 3  . ibuprofen (ADVIL,MOTRIN) 200 MG tablet Take 1-2 tablets (200-400 mg total) by mouth every 6 (six) hours as needed for fever, headache, mild pain or moderate pain. 30 tablet 0  . isosorbide dinitrate (ISORDIL) 10 MG tablet Take 1 tablet (10 mg total) by mouth 3 (  three) times daily with meals. 270 tablet 3  . ketoconazole (NIZORAL) 2 % shampoo     . ketotifen (ZADITOR) 0.025 % ophthalmic solution Place 1 drop into both eyes 2 (two) times daily.    Marland Kitchen lactulose (CHRONULAC) 10 GM/15ML solution Take 30 mLs (20 g total) by mouth daily. 240 mL 0  . Multiple Vitamin (MULTIVITAMIN) tablet Take 1 tablet by mouth every morning.     Vladimir Faster Glycol-Propyl Glycol (SYSTANE OP) Place 1 drop into both eyes every morning.     . propranolol (INDERAL) 20 MG tablet TAKE ONE TABLET BY MOUTH TWICE DAILY 60 tablet 3   No current  facility-administered medications for this visit.    Allergies as of 03/17/2016  . (No Known Allergies)    Family History  Problem Relation Age of Onset  . Breast cancer Sister   . Lung cancer Brother   . Gastric cancer Sister   . Lung cancer Sister   . Breast cancer Other   . Colon cancer Neg Hx   . Colon polyps Neg Hx   . Rectal cancer Neg Hx   . Stomach cancer Neg Hx   . Esophageal cancer Neg Hx     Social History   Social History  . Marital Status: Widowed    Spouse Name: N/A  . Number of Children: 4  . Years of Education: N/A   Occupational History  . retired    Social History Main Topics  . Smoking status: Never Smoker   . Smokeless tobacco: Never Used  . Alcohol Use: 1.8 - 2.4 oz/week    3-4 Cans of beer per week     Comment: she quit 02/2015   . Drug Use: No  . Sexual Activity: Not on file   Other Topics Concern  . Not on file   Social History Narrative   Widowed, grown son lives with her   Retired from Manufacturing engineer   Has total of #5 children   Enjoys watching TV and planting flowers     Physical Exam: BP 150/70 mmHg  Pulse 68  Ht '5\' 3"'$  (1.6 m)  Wt 129 lb (58.514 kg)  BMI 22.86 kg/m2 Constitutional: generally well-appearing Psychiatric: alert and oriented x3 Abdomen: soft, nontender, nondistended, no obvious ascites, no peritoneal signs, normal bowel sounds No lower extremity edema  Assessment and plan: 70 y.o. female with well compensated cirrhosis  Etiology autoimmune plus minus alcohol, she stopped drinking many years ago. 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 This is based on normal transaminases and not significantly elevated immunoglobulin levels. She will get a basic set of labs today including CBC, complete metabolic profile, coags, IgG Quant level. She will also get hepatitis A and B serologies to check to see if she is immune to these viruses. She is not immune that we will begin immunization series for her.  She'll return to see me in 6 months and sooner if needed. Her hepatocellular cancer scared for by Dr. Irwin Brakeman and interventional radiology.ng    Owens Loffler, MD [facility] 03/17/2016, 8:40 AM

## 2016-03-20 LAB — IGG: IgG (Immunoglobin G), Serum: 2120 mg/dL — ABNORMAL HIGH (ref 690–1700)

## 2016-03-22 ENCOUNTER — Other Ambulatory Visit: Payer: Self-pay

## 2016-03-22 DIAGNOSIS — K7460 Unspecified cirrhosis of liver: Secondary | ICD-10-CM

## 2016-03-24 ENCOUNTER — Ambulatory Visit: Payer: Medicare Other

## 2016-03-27 ENCOUNTER — Ambulatory Visit
Admission: RE | Admit: 2016-03-27 | Discharge: 2016-03-27 | Disposition: A | Discharge status: Home / Self Care | Payer: Medicare Other | Source: Ambulatory Visit

## 2016-03-27 DIAGNOSIS — Z1231 Encounter for screening mammogram for malignant neoplasm of breast: Secondary | ICD-10-CM | POA: Diagnosis not present

## 2016-03-29 ENCOUNTER — Ambulatory Visit (INDEPENDENT_AMBULATORY_CARE_PROVIDER_SITE_OTHER): Payer: Medicare Other | Admitting: Gastroenterology

## 2016-03-29 DIAGNOSIS — Z23 Encounter for immunization: Secondary | ICD-10-CM

## 2016-03-29 DIAGNOSIS — K7460 Unspecified cirrhosis of liver: Secondary | ICD-10-CM

## 2016-04-18 ENCOUNTER — Other Ambulatory Visit: Payer: Self-pay | Admitting: Internal Medicine

## 2016-05-01 ENCOUNTER — Ambulatory Visit (INDEPENDENT_AMBULATORY_CARE_PROVIDER_SITE_OTHER): Payer: Medicare Other | Admitting: Gastroenterology

## 2016-05-01 DIAGNOSIS — K7460 Unspecified cirrhosis of liver: Secondary | ICD-10-CM | POA: Diagnosis not present

## 2016-05-01 DIAGNOSIS — Z23 Encounter for immunization: Secondary | ICD-10-CM | POA: Diagnosis not present

## 2016-05-04 ENCOUNTER — Other Ambulatory Visit: Payer: Self-pay | Admitting: Internal Medicine

## 2016-05-09 ENCOUNTER — Telehealth: Payer: Self-pay | Admitting: *Deleted

## 2016-05-09 MED ORDER — AMLODIPINE BESYLATE 10 MG PO TABS: 10.0000 mg | ORAL_TABLET | Freq: Every day | ORAL | Status: DC

## 2016-05-09 NOTE — Telephone Encounter (Signed)
Received call daughter states pharmacy sent request to have amlodipine refill about 2 weeks ago. Inform daughter per chart rx was received & sent back. After further research rx amlodipine 5 mg directions was to take 2 pills a day. Insurance will not cover twice a day if pill comes in a 10 mg tab. Inform her will send new rx for amlodipine 10 mg to take 1 pill a day...Sarah Harmon

## 2016-05-09 NOTE — Telephone Encounter (Signed)
A user error has taken place: open by mistake.../lmb  

## 2016-05-12 ENCOUNTER — Other Ambulatory Visit: Payer: Self-pay | Admitting: *Deleted

## 2016-05-12 DIAGNOSIS — C220 Liver cell carcinoma: Secondary | ICD-10-CM

## 2016-05-16 ENCOUNTER — Encounter: Payer: Self-pay | Admitting: Hematology

## 2016-05-16 ENCOUNTER — Telehealth: Payer: Self-pay | Admitting: Hematology

## 2016-05-16 ENCOUNTER — Other Ambulatory Visit (HOSPITAL_BASED_OUTPATIENT_CLINIC_OR_DEPARTMENT_OTHER): Payer: Medicare Other

## 2016-05-16 ENCOUNTER — Ambulatory Visit (HOSPITAL_BASED_OUTPATIENT_CLINIC_OR_DEPARTMENT_OTHER): Payer: Medicare Other | Admitting: Hematology

## 2016-05-16 VITALS — BP 229/103 | HR 74 | Temp 98.7°F | Resp 18 | Ht 63.0 in | Wt 124.5 lb

## 2016-05-16 DIAGNOSIS — D61818 Other pancytopenia: Secondary | ICD-10-CM

## 2016-05-16 DIAGNOSIS — C220 Liver cell carcinoma: Secondary | ICD-10-CM

## 2016-05-16 DIAGNOSIS — K7030 Alcoholic cirrhosis of liver without ascites: Secondary | ICD-10-CM | POA: Diagnosis not present

## 2016-05-16 LAB — COMPREHENSIVE METABOLIC PANEL
ALT: 15 U/L (ref 0–55)
AST: 26 U/L (ref 5–34)
Albumin: 3.3 g/dL — ABNORMAL LOW (ref 3.5–5.0)
Alkaline Phosphatase: 63 U/L (ref 40–150)
Anion Gap: 8 mEq/L (ref 3–11)
BUN: 13.1 mg/dL (ref 7.0–26.0)
CHLORIDE: 103 meq/L (ref 98–109)
CO2: 27 meq/L (ref 22–29)
Calcium: 9.7 mg/dL (ref 8.4–10.4)
Creatinine: 1.3 mg/dL — ABNORMAL HIGH (ref 0.6–1.1)
EGFR: 48 mL/min/{1.73_m2} — AB (ref >90)
GLUCOSE: 104 mg/dL (ref 70–140)
POTASSIUM: 4.6 meq/L (ref 3.5–5.1)
SODIUM: 138 meq/L (ref 136–145)
Total Bilirubin: 0.41 mg/dL (ref 0.20–1.20)
Total Protein: 8.8 g/dL — ABNORMAL HIGH (ref 6.4–8.3)

## 2016-05-16 LAB — CBC WITH DIFFERENTIAL/PLATELET
BASO%: 1.1 % (ref 0.0–2.0)
Basophils Absolute: 0 10e3/uL (ref 0.0–0.1)
EOS%: 2.4 % (ref 0.0–7.0)
Eosinophils Absolute: 0.1 10e3/uL (ref 0.0–0.5)
HCT: 43.5 % (ref 34.8–46.6)
HGB: 14.5 g/dL (ref 11.6–15.9)
LYMPH%: 18.6 % (ref 14.0–49.7)
MCH: 30.7 pg (ref 25.1–34.0)
MCHC: 33.2 g/dL (ref 31.5–36.0)
MCV: 92.6 fL (ref 79.5–101.0)
MONO#: 0.4 10e3/uL (ref 0.1–0.9)
MONO%: 9.8 % (ref 0.0–14.0)
NEUT#: 2.7 10e3/uL (ref 1.5–6.5)
NEUT%: 68.1 % (ref 38.4–76.8)
Platelets: 148 10e3/uL (ref 145–400)
RBC: 4.7 10e6/uL (ref 3.70–5.45)
RDW: 15.2 % — ABNORMAL HIGH (ref 11.2–14.5)
WBC: 4 10e3/uL (ref 3.9–10.3)
lymph#: 0.7 10e3/uL — ABNORMAL LOW (ref 0.9–3.3)

## 2016-05-16 LAB — FERRITIN: Ferritin: 314 ng/mL — ABNORMAL HIGH (ref 9–269)

## 2016-05-16 LAB — IRON AND TIBC
%SAT: 17 % — AB (ref 21–57)
Iron: 51 ug/dL (ref 41–142)
TIBC: 298 ug/dL (ref 236–444)
UIBC: 247 ug/dL (ref 120–384)

## 2016-05-16 NOTE — Progress Notes (Signed)
[facility]  [phone] [phone]  Clinic Follow Up Note   Patient Care Team: Biagio Borg, MD as PCP - General Truitt Merle, MD as Consulting Physician (Hematology) 05/16/2016  CHIEF COMPLAINTS:  Follow up stage I Hepatocellular carcinoma  Oncology History   Hepatocellular carcinoma   Staging form: Liver (Excluding Intrahepatic Bile Ducts), AJCC 7th Edition     Clinical: Stage I (T1, N0, M0) - Unsigned       Hepatocellular carcinoma ([place])   03/01/2015 Tumor Marker AFP 8.4   03/01/2015 Imaging Liver MRI showed home 1.5 cm hyperenhancing T2 bright lesion within the right hepatic lobe, consistent with hepatocellular carcinoma. There is an indeterminate 1cm T2 bright lesion within the hepatic dome, w/o definitive arterial phase enhancement    03/01/2015 Initial Diagnosis Hepatocellular carcinoma   05/14/2015 Procedure CT guided thermal ablation of hepatocellular carcinoma   01/25/2016 Imaging liver MRI with and without contrast showed further decreased in size of ablation defect within the segment 8 of the liver compatible with treated tumor,no new enhancing liver lesions identified. Liver cirrhosis.   HISTORY OF PRESENTING ILLNESS:  Sarah Harmon 70 y.o. female is here because of abnormal liver MRI, which is highly suspicious for [facility].  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 No history of ascites or GI bleeding. She has been followed I her primary care physician since then, has not been read by GI. she did quit drinking a few times, and started drinking again about 2 years ago.  She was seen by PCP for routine checkup and was found to have low sodium, she was referred to Hospital and was admitted. She had abdominal ultrasound done on 05/31/2015 which showed liver cirrhosis and a questionable mass in the right lobe measuring 2 cm. She underwent a liver MRI on same day,  which showed a 1.5 cm enhancing lesion in the right lobe of liver, and an additional 1.0 cm indeterminate lesion in the liver. AFP was found to be elevated at 8.4. No liver biopsy was performed. She was subsequently referred to our cancer center for further evaluation.  She lives with her son at home. She is able to do all her ADLs, and some light housework. She goes out for shopping with her daughters, does not drive. She denies any abdominal discomfort or bloating, no nausea, vomiting, diarrhea or constipation. Her appetite and energy level is moderate, unchanged from before. No recent weight loss.  CURRENT THERAPY: Observation   INTERIM HISTORY [name] returns for follow-up, she is accompanied by her daughter to the clinic today. She is doing well overall, she saw IR Dr. Kathlene Cote in 12/2015, and her repeated liver MRI last month showed good response to the patient, no other new lesions. She denies any significant abdominal pain, distention, nausea, change of bowel habits or other new symptoms. She has mild-to-moderate fatigue, but able to tolerate a routine activities.She is not very physically active at home.  MEDICAL HISTORY:  Past Medical History  Diagnosis Date  . ALCOHOL ABUSE 07/13/2010  . ANXIETY 12/22/2009  . DEPRESSION 12/22/2009  . 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 FATIGUE 12/23/2010  . GALLSTONES 12/22/2009  . HYPERLIPIDEMIA 12/22/2009  . HYPERTENSION 12/22/2009  . HYPONATREMIA 12/22/2009  . 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 OSTEOPENIA 12/22/2009  . Pancytopenia 07/13/2010  . VITAMIN D DEFICIENCY 12/23/2010  . WEIGHT LOSS 12/23/2010  . Gallstones   . RENAL  CYST, LEFT 12/22/2009  . Headache     occasional   . DJD (degenerative joint disease), cervical     patient denies on preop of 05/10/15     SURGICAL HISTORY: Past Surgical History  Procedure Laterality Date  . Abdominal hysterectomy      SOCIAL HISTORY: History   Social History  . Marital Status: Widowed    Spouse Name:  N/A  . Number of Children: 5, she lives with one of her son   . Years of Education: N/A   Occupational History  . Retired    Social History Main Topics  . Smoking status: Never Smoker   . Smokeless tobacco: Not on file  . Alcohol Use: Yes  . Drug Use: No  . Sexual Activity: Not on file   Other Topics Concern  . Not on file   Social History Narrative    FAMILY HISTORY: Family History  Problem Relation Age of Onset  . Breast cancer Sister   . Lung cancer Brother   . Gastric cancer Sister   . Lung cancer Sister   . Breast cancer Other   . Colon cancer Neg Hx   . Colon polyps Neg Hx   . Rectal cancer Neg Hx   . Stomach cancer Neg Hx   . Esophageal cancer Neg Hx     ALLERGIES:  has No Known Allergies.  MEDICATIONS:  Current Outpatient Prescriptions  Medication Sig Dispense Refill  . alendronate (FOSAMAX) 70 MG tablet TAKE ONE TABLET BY MOUTH ONCE A WEEK (EVERY 7 DAYS) TAKE WITH A FULL GLASS OF WATER ON AN EMPTY STOMACH 12 tablet 0  . ALPRAZolam (XANAX) 0.5 MG tablet TAKE ONE TABLET BY MOUTH ONCE DAILY AT BEDTIME AS NEEDED 90 tablet 1  . amLODipine (NORVASC) 10 MG tablet Take 1 tablet (10 mg total) by mouth daily. 90 tablet 0  . Cholecalciferol (VITAMIN D3) 1000 UNITS CAPS Take 1 capsule by mouth every morning.     . citalopram (CELEXA) 20 MG tablet Take 0.5 tablets (10 mg total) by mouth daily. 90 tablet 1  . FLUOCINOLONE ACETONIDE SCALP 0.01 % OIL Reported on [id number]    . folic acid (FOLVITE) 1 MG tablet TAKE ONE TABLET BY MOUTH ONCE DAILY 90 tablet 3  . hydrALAZINE (APRESOLINE) 100 MG tablet Take 1 tablet (100 mg total) by mouth 3 (three) times daily. 270 tablet 3  . ibuprofen (ADVIL,MOTRIN) 200 MG tablet Take 1-2 tablets (200-400 mg total) by mouth every 6 (six) hours as needed for fever, headache, mild pain or moderate pain. 30 tablet 0  . isosorbide dinitrate (ISORDIL) 10 MG tablet Take 1 tablet (10 mg total) by mouth 3 (three) times daily with meals. 270 tablet 3    . ketoconazole (NIZORAL) 2 % shampoo     . ketotifen (ZADITOR) 0.025 % ophthalmic solution Place 1 drop into both eyes 2 (two) times daily.    Marland Kitchen lactulose (CHRONULAC) 10 GM/15ML solution Take 30 mLs (20 g total) by mouth daily. 240 mL 0  . Multiple Vitamin (MULTIVITAMIN) tablet Take 1 tablet by mouth every morning.     Vladimir Faster Glycol-Propyl Glycol (SYSTANE OP) Place 1 drop into both eyes every morning.     . propranolol (INDERAL) 20 MG tablet TAKE ONE TABLET BY MOUTH TWICE DAILY 60 tablet 3   No current facility-administered medications for this visit.    REVIEW OF SYSTEMS:   Constitutional: Denies fevers, chills or abnormal night sweats Eyes: Denies blurriness of vision, double  vision or watery eyes Ears, nose, mouth, throat, and face: Denies mucositis or sore throat Respiratory: Denies cough, dyspnea or wheezes Cardiovascular: Denies palpitation, chest discomfort or lower extremity swelling Gastrointestinal:  Denies nausea, heartburn or change in bowel habits Skin: Denies abnormal skin rashes Lymphatics: Denies new lymphadenopathy or easy bruising Neurological:Denies numbness, tingling or new weaknesses Behavioral/Psych: Mood is stable, no new changes  All other systems were reviewed with the patient and are negative.  PHYSICAL EXAMINATION: ECOG PERFORMANCE STATUS: 1 - Symptomatic but completely ambulatory  Filed Vitals:   05/16/16 1058  BP: 229/103  Pulse: 74  Temp: 98.7 F (37.1 C)  Resp: 18   Filed Weights   05/16/16 1058  Weight: 124 lb 8 oz (56.473 kg)    GENERAL:alert, no distress and comfortable SKIN: skin color, texture, turgor are normal, no rashes or significant lesions EYES: normal, conjunctiva are pink and non-injected, sclera clear OROPHARYNX:no exudate, no erythema and lips, buccal mucosa, and tongue normal  NECK: supple, thyroid normal size, non-tender, without nodularity LYMPH:  no palpable lymphadenopathy in the cervical, axillary or  inguinal LUNGS: clear to auscultation and percussion with normal breathing effort HEART: regular rate & rhythm and no murmurs and no lower extremity edema ABDOMEN:abdomen soft, non-tender and normal bowel sounds Musculoskeletal:no cyanosis of digits and no clubbing  PSYCH: alert & oriented x 3 with fluent speech NEURO: no focal motor/sensory deficits  LABORATORY DATA:  I have reviewed the data as listed CBC Latest Ref Rng 05/16/2016 11/10/2015 09/16/2015  WBC 3.9 - 10.3 10e3/uL 4.0 5.3 3.6(L)  Hemoglobin 11.6 - 15.9 g/dL 14.5 12.3 11.5(L)  Hematocrit 34.8 - 46.6 % 43.5 37.3 35.2  Platelets 145 - 400 10e3/uL 148 168.0 133(L)   CMP Latest Ref Rng 05/16/2016 03/17/2016 01/15/2016  Glucose 70 - 140 mg/dl 104 87 98  BUN 7.0 - 26.0 mg/dL 13.1 14 12   Creatinine 0.6 - 1.1 mg/dL 1.3(H) 1.40(H) 1.24(H)  Sodium 136 - 145 mEq/L 138 134(L) 131(L)  Potassium 3.5 - 5.1 mEq/L 4.6 4.0 4.6  Chloride 96 - 112 mEq/L - 99 95(L)  CO2 22 - 29 mEq/L 27 28 27   Calcium 8.4 - 10.4 mg/dL 9.7 9.6 9.4  Total Protein 6.4 - 8.3 g/dL 8.8(H) 7.8 7.8  Total Bilirubin 0.20 - 1.20 mg/dL 0.41 0.3 0.4  Alkaline Phos 40 - 150 U/L 63 53 48  AST 5 - 34 U/L 26 20 22   ALT 0 - 55 U/L 15 12 12     RADIOGRAPHIC STUDIES: I have personally reviewed the radiological images as listed and agreed with the findings in the report.   Liver MRI w wo contrast 01/25/2016  IMPRESSION: 1. Further decrease in size of ablation defect within segment 8 of the liver compatible with treated tumor. 2. No new enhancing liver lesions identified. 3. Cirrhosis 4. Previously noted gallstone has now migrated into the distal portion of the cystic duct and there is progressive distention of the gallbladder lumen as well as mild biliary dilatation. 5. Bosniak category 2 Fahrenheit lesion arising from upper pole of the left kidney is unchanged. Followup imaging in 12 months is recommended.  ASSESSMENT & PLAN:  70 year old African-American female, with  history of alcohol liver cirrhosis with history of hepatic enphlopathy, uncontrolled HTN, anxiety and depression.  1. Stage I hepatocellular carcinoma  -I previously reviewed her liver MRI scan images with her and her family members in great detail. The 1.5 cm liver lesion in right lobe has typical arterial enhancement and venous washout, his  consistent with [place], in the background of liver cirrhosis. The other 1.0 centimeter lesion does not have typical arterial enhancement, and is indeterminate.  -She also has elevated AFP. -Her liver MRI is diagnostic, I do not think she needs biopsy to confirm. -She is now status post CT-guided microwave ablation by Dr. Kathlene Cote in May 2016, and had a good response. -Her restaging liver MRI in 01/2016 showed treatment effect, no recurrence or new lesions. -We'll continue surveillance. She will follow-up with Dr. Kathlene Cote also, I will see her every 6-12 months  -Lab reviewed, her liver function is normal, normal CBC. Her AFP from today still pending  2. Mild anemia and thrombocytopenia -Likely secondary to liver cirrhosis and splenomegaly -Her [id number] and folic acid level were high in 2011, I repeated today, result is still pending  -iron study is normal today -Her anemia and thrombocytopenia has resolved today, we'll continue monitoring.  2. 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 Ardis Hughs  3. Hypertension, poorly controlled -Her blood pressure was 229/103 in my office today, asymptomatic. She did not take her blood pressure medication this morning.  -She will continue medication -I strongly encouraged her to monitor her blood pressure at home and follow-up with her primary care physician  Follow-up, I'll see her back in 6 month with lab and abdomen MRI, or in one year if she sees Dr. Kathlene Cote in the next 6 months.    Orders Placed This Encounter  Procedures  . MR  Abdomen W Wo Contrast    Standing Status: Future     Number of Occurrences:      Standing Expiration Date: 07/16/2017    Order Specific Question:  If indicated for the ordered procedure, I authorize the administration of contrast media per Radiology protocol    Answer:  Yes    Order Specific Question:  Reason for Exam (SYMPTOM  OR DIAGNOSIS REQUIRED)    Answer:  follow up liver cancer    Order Specific Question:  Preferred imaging location?    Answer:  [facility] (table limit-350 lbs)    Order Specific Question:  What is the patient's sedation requirement?    Answer:  No Sedation    Order Specific Question:  Does the patient have a pacemaker or implanted devices?    Answer:  No  . AFP tumor marker    Standing Status: Future     Number of Occurrences: 1     Standing Expiration Date: 05/16/2017  . Ferritin    Standing Status: Future     Number of Occurrences: 1     Standing Expiration Date: 05/16/2017  . Vitamin B12    Standing Status: Future     Number of Occurrences: 1     Standing Expiration Date: 05/16/2017  . CBC with Differential    Standing Status: Standing     Number of Occurrences: 20     Standing Expiration Date: 05/16/2021  . Comprehensive metabolic panel    Standing Status: Standing     Number of Occurrences: 20     Standing Expiration Date: 05/16/2021  . AFP tumor marker    Standing Status: Standing     Number of Occurrences: 20     Standing Expiration Date: 05/16/2021    All questions were answered. The patient knows to call the clinic with any problems, questions or concerns. I spent 25 minutes counseling the patient face to face. The total time spent  in the appointment was 30 minutes and more than 50% was on counseling.     Truitt Merle, MD 05/16/2016 4:13 PM

## 2016-05-16 NOTE — Telephone Encounter (Signed)
Gave and printed appt sched and avs fo rpt; for NOV  °

## 2016-05-17 ENCOUNTER — Other Ambulatory Visit: Payer: Self-pay | Admitting: Internal Medicine

## 2016-05-17 ENCOUNTER — Other Ambulatory Visit: Payer: Self-pay | Admitting: Hematology

## 2016-05-17 ENCOUNTER — Telehealth: Payer: Self-pay | Admitting: Hematology

## 2016-05-17 LAB — VITAMIN B12: VITAMIN B 12: 681 pg/mL (ref 211–946)

## 2016-05-17 LAB — AFP TUMOR MARKER: AFP, Serum, Tumor Marker: 7.7 ng/mL (ref 0.0–8.3)

## 2016-05-17 LAB — FOLATE RBC
Folate, Hemolysate: >620 ng/mL
HEMATOCRIT: 41 % (ref 34.0–46.6)

## 2016-05-17 LAB — ALPHA FETO PROTEIN (PARALLEL TESTING): AFP TUMOR MARKER: 9.4 ng/mL — AB (ref <6.1)

## 2016-05-17 NOTE — Telephone Encounter (Signed)
cld & spoke to pt daughter [name] amd adv per Dr Ernestina Penna pof to let pt know to disregard appts in November. Dr Kathlene Cote will see her with a scan in 07/2016 and will only need to see her (Dr. Burr Medico) on a as needed basis)

## 2016-05-17 NOTE — Telephone Encounter (Signed)
Please advise 

## 2016-05-17 NOTE — Telephone Encounter (Signed)
Xanax Done hardcopy to corinne, o/w other 2 done erx

## 2016-05-17 NOTE — Telephone Encounter (Signed)
Medication refill sent to pharmacy  

## 2016-06-01 ENCOUNTER — Ambulatory Visit (INDEPENDENT_AMBULATORY_CARE_PROVIDER_SITE_OTHER): Payer: Medicare Other | Admitting: Internal Medicine

## 2016-06-01 ENCOUNTER — Encounter: Payer: Self-pay | Admitting: Internal Medicine

## 2016-06-01 VITALS — BP 122/72 | HR 71 | Temp 98.2°F | Resp 20 | Wt 127.0 lb

## 2016-06-01 DIAGNOSIS — R7302 Impaired glucose tolerance (oral): Secondary | ICD-10-CM | POA: Diagnosis not present

## 2016-06-01 DIAGNOSIS — N183 Chronic kidney disease, stage 3 unspecified: Secondary | ICD-10-CM

## 2016-06-01 DIAGNOSIS — F329 Major depressive disorder, single episode, unspecified: Secondary | ICD-10-CM

## 2016-06-01 DIAGNOSIS — I10 Essential (primary) hypertension: Secondary | ICD-10-CM

## 2016-06-01 DIAGNOSIS — F32A Depression, unspecified: Secondary | ICD-10-CM

## 2016-06-01 MED ORDER — FOLIC ACID 1 MG PO TABS: 1.0000 mg | ORAL_TABLET | Freq: Every day | ORAL | Status: DC

## 2016-06-01 NOTE — Assessment & Plan Note (Signed)
stable overall by history and exam, recent data reviewed with pt, and pt to continue medical treatment as before,  to f/u any worsening symptoms or concerns Lab Results  Component Value Date   CREATININE 1.3* 05/16/2016

## 2016-06-01 NOTE — Assessment & Plan Note (Signed)
stable overall by history and exam, recent data reviewed with pt, and pt to continue medical treatment as before,  to f/u any worsening symptoms or concerns BP Readings from Last 3 Encounters:  06/01/16 122/72  05/16/16 229/103  03/17/16 150/70

## 2016-06-01 NOTE — Assessment & Plan Note (Signed)
stable overall by history and exam, recent data reviewed with pt, and pt to continue medical treatment as before,  to f/u any worsening symptoms or concerns  

## 2016-06-01 NOTE — Assessment & Plan Note (Signed)
stable overall by history and exam, recent data reviewed with pt, and pt to continue medical treatment as before,  to f/u any worsening symptoms or concerns Lab Results  Component Value Date   HGBA1C 5.8 02/26/2015   Lab Results  Component Value Date   WBC 4.0 05/16/2016   HGB 14.5 05/16/2016   HCT 43.5 05/16/2016   HCT 41.0 05/16/2016   PLT 148 05/16/2016   GLUCOSE 104 05/16/2016   CHOL 182 02/26/2015   TRIG 182.0* 02/26/2015   HDL 94.40 02/26/2015   LDLCALC 51 02/26/2015   ALT 15 05/16/2016   AST 26 05/16/2016   NA 138 05/16/2016   K 4.6 05/16/2016   CL 99 03/17/2016   CREATININE 1.3* 05/16/2016   BUN 13.1 05/16/2016   CO2 27 05/16/2016   TSH 1.518 03/05/2015   INR 1.0 03/17/2016   HGBA1C 5.8 02/26/2015

## 2016-06-01 NOTE — Progress Notes (Signed)
Pre visit review using our clinic review tool, if applicable. No additional management support is needed unless otherwise documented below in the visit note. 

## 2016-06-01 NOTE — Progress Notes (Signed)
Subjective:    Patient ID: Sarah Harmon, female    DOB: [birth year], 70 y.o.   MRN: [id number]  HPI Here to f/u; overall doing ok,  Pt denies chest pain, increasing sob or doe, wheezing, orthopnea, PND, increased LE swelling, palpitations, dizziness or syncope.  Pt denies new neurological symptoms such as new headache, or facial or extremity weakness or numbness.  Pt denies polydipsia, polyuria, or low sugar episode.   Pt denies new neurological symptoms such as new headache, or facial or extremity weakness or numbness.   Pt states overall good compliance with meds, mostly trying to follow appropriate diet, with wt overall stable,  but little exercise however. Seeing Dr Burr Medico regularly, has no specific complaints.  Overall good compliance with treatment, and good medicine tolerability. Denies worsening depressive symptoms, suicidal ideation, or panic; has some ongoing anxiety Wt Readings from Last 3 Encounters:  06/01/16 127 lb (57.607 kg)  05/16/16 124 lb 8 oz (56.473 kg)  03/17/16 129 lb (58.514 kg)   Past Medical History  Diagnosis Date  . ALCOHOL ABUSE 07/13/2010  . ANXIETY 12/22/2009  . DEPRESSION 12/22/2009  . 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 FATIGUE 12/23/2010  . GALLSTONES 12/22/2009  . HYPERLIPIDEMIA 12/22/2009  . HYPERTENSION 12/22/2009  . HYPONATREMIA 12/22/2009  . 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 OSTEOPENIA 12/22/2009  . Pancytopenia 07/13/2010  . VITAMIN D DEFICIENCY 12/23/2010  . WEIGHT LOSS 12/23/2010  . Gallstones   . RENAL CYST, LEFT 12/22/2009  . Headache     occasional   . DJD (degenerative joint disease), cervical     patient denies on preop of 05/10/15    Past Surgical History  Procedure Laterality Date  . Abdominal hysterectomy      reports that she has never smoked. She has never used smokeless tobacco. She reports that she drinks about 1.8 - 2.4 oz of alcohol per week. She reports that she does not use illicit drugs. family history includes Breast cancer in  her other and sister; Gastric cancer in her sister; Lung cancer in her brother and sister. There is no history of Colon cancer, Colon polyps, Rectal cancer, Stomach cancer, or Esophageal cancer. No Known Allergies Current Outpatient Prescriptions on File Prior to Visit  Medication Sig Dispense Refill  . alendronate (FOSAMAX) 70 MG tablet TAKE ONE TABLET BY MOUTH ONCE A WEEK (EVERY 7 DAYS) TAKE WITH A FULL GLASS OF WATER ON AN EMPTY STOMACH 12 tablet 0  . ALPRAZolam (XANAX) 0.5 MG tablet TAKE ONE TABLET BY MOUTH AT BEDTIME AS NEEDED 90 tablet 1  . amLODipine (NORVASC) 10 MG tablet Take 1 tablet (10 mg total) by mouth daily. 90 tablet 0  . Cholecalciferol (VITAMIN D3) 1000 UNITS CAPS Take 1 capsule by mouth every morning.     . citalopram (CELEXA) 20 MG tablet Take 0.5 tablets (10 mg total) by mouth daily. 90 tablet 1  . FLUOCINOLONE ACETONIDE SCALP 0.01 % OIL Reported on 01/25/2016    . hydrALAZINE (APRESOLINE) 100 MG tablet TAKE ONE TABLET BY MOUTH THREE TIMES DAILY 270 tablet 2  . ibuprofen (ADVIL,MOTRIN) 200 MG tablet Take 1-2 tablets (200-400 mg total) by mouth every 6 (six) hours as needed for fever, headache, mild pain or moderate pain. 30 tablet 0  . isosorbide dinitrate (ISORDIL) 10 MG tablet TAKE ONE TABLET BY MOUTH THREE TIMES DAILY WITH FOOD 270 tablet 2  . ketoconazole (NIZORAL) 2 % shampoo     . ketotifen (ZADITOR) 0.025 % ophthalmic solution  Place 1 drop into both eyes 2 (two) times daily.    Marland Kitchen lactulose (CHRONULAC) 10 GM/15ML solution Take 30 mLs (20 g total) by mouth daily. 240 mL 0  . Multiple Vitamin (MULTIVITAMIN) tablet Take 1 tablet by mouth every morning.     Vladimir Faster Glycol-Propyl Glycol (SYSTANE OP) Place 1 drop into both eyes every morning.     . propranolol (INDERAL) 20 MG tablet TAKE ONE TABLET BY MOUTH TWICE DAILY 60 tablet 3   No current facility-administered medications on file prior to visit.   Review of Systems  Constitutional: Negative for unusual diaphoresis  or night sweats HENT: Negative for ear swelling or discharge Eyes: Negative for worsening visual haziness  Respiratory: Negative for choking and stridor.   Gastrointestinal: Negative for distension or worsening eructation Genitourinary: Negative for retention or change in urine volume.  Musculoskeletal: Negative for other MSK pain or swelling Skin: Negative for color change and worsening wound Neurological: Negative for tremors and numbness other than noted  Psychiatric/Behavioral: Negative for decreased concentration or agitation other than above       Objective:   Physical Exam BP 122/72 mmHg  Pulse 71  Temp(Src) 98.2 F (36.8 C) (Oral)  Resp 20  Wt 127 lb (57.607 kg)  SpO2 94% VS noted,  Constitutional: Pt appears in no apparent distress HENT: Head: NCAT.  Right Ear: External ear normal.  Left Ear: External ear normal.  Eyes: . Pupils are equal, round, and reactive to light. Conjunctivae and EOM are normal Neck: Normal range of motion. Neck supple.  Cardiovascular: Normal rate and regular rhythm.   Pulmonary/Chest: Effort normal and breath sounds without rales or wheezing.  Abd:  Soft, NT, ND, + BS Neurological: Pt is alert. At baseline confused  but able to say at this office, president and year, motor grossly intact Skin: Skin is warm. No rash, no LE edema except slight left pedal puffiness Psychiatric: Pt behavior is normal. No agitation. not depressed affect    Assessment & Plan:

## 2016-06-01 NOTE — Patient Instructions (Signed)
Please continue all other medications as before, and refills have been done if requested.  Please have the pharmacy call with any other refills you may need.  Please continue your efforts at being more active, low cholesterol diet, and weight control.  You are otherwise up to date with prevention measures today.  Please keep your appointments with your specialists as you may have planned  Please return in 6 months, or sooner if needed 

## 2016-06-13 DIAGNOSIS — K7030 Alcoholic cirrhosis of liver without ascites: Secondary | ICD-10-CM | POA: Diagnosis not present

## 2016-06-13 DIAGNOSIS — C220 Liver cell carcinoma: Secondary | ICD-10-CM | POA: Diagnosis not present

## 2016-07-04 ENCOUNTER — Encounter: Payer: Self-pay | Admitting: Gastroenterology

## 2016-07-13 ENCOUNTER — Other Ambulatory Visit: Payer: Self-pay | Admitting: *Deleted

## 2016-07-13 DIAGNOSIS — C220 Liver cell carcinoma: Secondary | ICD-10-CM

## 2016-07-26 ENCOUNTER — Other Ambulatory Visit: Payer: Self-pay | Admitting: Internal Medicine

## 2016-08-29 ENCOUNTER — Other Ambulatory Visit (HOSPITAL_COMMUNITY): Payer: Self-pay | Admitting: Interventional Radiology

## 2016-08-29 DIAGNOSIS — C220 Liver cell carcinoma: Secondary | ICD-10-CM

## 2016-08-31 ENCOUNTER — Ambulatory Visit: Payer: Medicare Other | Admitting: Internal Medicine

## 2016-08-31 ENCOUNTER — Encounter: Payer: Self-pay | Admitting: Internal Medicine

## 2016-08-31 ENCOUNTER — Ambulatory Visit (INDEPENDENT_AMBULATORY_CARE_PROVIDER_SITE_OTHER): Payer: Medicare Other | Admitting: Internal Medicine

## 2016-08-31 VITALS — BP 136/70 | HR 82 | Temp 97.9°F | Resp 20 | Wt 127.0 lb

## 2016-08-31 DIAGNOSIS — N183 Chronic kidney disease, stage 3 unspecified: Secondary | ICD-10-CM

## 2016-08-31 DIAGNOSIS — R7302 Impaired glucose tolerance (oral): Secondary | ICD-10-CM | POA: Diagnosis not present

## 2016-08-31 DIAGNOSIS — I10 Essential (primary) hypertension: Secondary | ICD-10-CM

## 2016-08-31 NOTE — Patient Instructions (Addendum)
Please continue all other medications as before, and refills have been done if requested.  Please have the pharmacy call with any other refills you may need.  Please continue your efforts at being more active, low cholesterol diet, and weight control.  Please keep your appointments with your specialists as you may have planned - oncology next week  Please return in 6 months, or sooner if needed, with Lab testing done 3-5 days before

## 2016-08-31 NOTE — Progress Notes (Signed)
Pre visit review using our clinic review tool, if applicable. No additional management support is needed unless otherwise documented below in the visit note. 

## 2016-08-31 NOTE — Progress Notes (Signed)
Subjective:    Patient ID: Sarah Harmon, female    DOB: 12/02/1946, 70 y.o.   MRN: [id number]  HPI Here to f/u; overall doing ok,  Pt denies chest pain, increasing sob or doe, wheezing, orthopnea, PND, increased LE swelling, palpitations, dizziness or syncope.  Pt denies new neurological symptoms such as new headache, or facial or extremity weakness or numbness.  Pt denies polydipsia, polyuria, or low sugar episode.   Pt denies new neurological symptoms such as new headache, or facial or extremity weakness or numbness.   Pt states overall good compliance with meds, mostly trying to follow appropriate diet, with wt overall stable,  but little exercise however. Past Medical History:  Diagnosis Date  . ALCOHOL ABUSE 07/13/2010  . ANXIETY 12/22/2009  . DEPRESSION 12/22/2009  . 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 DJD (degenerative joint disease), cervical    patient denies on preop of 05/10/15   . FATIGUE 12/23/2010  . GALLSTONES 12/22/2009  . Gallstones   . Headache    occasional   . HYPERLIPIDEMIA 12/22/2009  . HYPERTENSION 12/22/2009  . HYPONATREMIA 12/22/2009  . 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 OSTEOPENIA 12/22/2009  . Pancytopenia 07/13/2010  . RENAL CYST, LEFT 12/22/2009  . VITAMIN D DEFICIENCY 12/23/2010  . WEIGHT LOSS 12/23/2010   Past Surgical History:  Procedure Laterality Date  . ABDOMINAL HYSTERECTOMY      reports that she has never smoked. She has never used smokeless tobacco. She reports that she drinks about 1.8 - 2.4 oz of alcohol per week . She reports that she does not use drugs. family history includes Breast cancer in her other and sister; Gastric cancer in her sister; Lung cancer in her brother and sister. No Known Allergies Current Outpatient Prescriptions on File Prior to Visit  Medication Sig Dispense Refill  . alendronate (FOSAMAX) 70 MG tablet TAKE ONE TABLET BY MOUTH ONCE A WEEK TAKE  WITH  A  FULL  GLASS  OF  WATER  ON  A  EMPTY  STOMACH 12 tablet 0  .  ALPRAZolam (XANAX) 0.5 MG tablet TAKE ONE TABLET BY MOUTH AT BEDTIME AS NEEDED 90 tablet 1  . amLODipine (NORVASC) 10 MG tablet TAKE ONE TABLET BY MOUTH ONCE DAILY 90 tablet 0  . Cholecalciferol (VITAMIN D3) 1000 UNITS CAPS Take 1 capsule by mouth every morning.     . citalopram (CELEXA) 20 MG tablet Take 0.5 tablets (10 mg total) by mouth daily. 90 tablet 1  . FLUOCINOLONE ACETONIDE SCALP 0.01 % OIL Reported on 06/22/7208    . folic acid (FOLVITE) 1 MG tablet Take 1 tablet (1 mg total) by mouth daily. 90 tablet 3  . hydrALAZINE (APRESOLINE) 100 MG tablet TAKE ONE TABLET BY MOUTH THREE TIMES DAILY 270 tablet 2  . ibuprofen (ADVIL,MOTRIN) 200 MG tablet Take 1-2 tablets (200-400 mg total) by mouth every 6 (six) hours as needed for fever, headache, mild pain or moderate pain. 30 tablet 0  . isosorbide dinitrate (ISORDIL) 10 MG tablet TAKE ONE TABLET BY MOUTH THREE TIMES DAILY WITH FOOD 270 tablet 2  . ketoconazole (NIZORAL) 2 % shampoo     . ketotifen (ZADITOR) 0.025 % ophthalmic solution Place 1 drop into both eyes 2 (two) times daily.    Marland Kitchen lactulose (CHRONULAC) 10 GM/15ML solution Take 30 mLs (20 g total) by mouth daily. 240 mL 0  . Multiple Vitamin (MULTIVITAMIN) tablet Take 1 tablet by mouth every morning.     Vladimir Faster Glycol-Propyl  Glycol (SYSTANE OP) Place 1 drop into both eyes every morning.     . propranolol (INDERAL) 20 MG tablet TAKE ONE TABLET BY MOUTH TWICE DAILY 60 tablet 3   No current facility-administered medications on file prior to visit.    Review of Systems  Constitutional: Negative for unusual diaphoresis or night sweats HENT: Negative for ear swelling or discharge Eyes: Negative for worsening visual haziness  Respiratory: Negative for choking and stridor.   Gastrointestinal: Negative for distension or worsening eructation Genitourinary: Negative for retention or change in urine volume.  Musculoskeletal: Negative for other MSK pain or swelling Skin: Negative for color  change and worsening wound Neurological: Negative for tremors and numbness other than noted  Psychiatric/Behavioral: Negative for decreased concentration or agitation other than above       Objective:   Physical Exam BP 136/70   Pulse 82   Temp 97.9 F (36.6 C) (Oral)   Resp 20   Wt 127 lb (57.6 kg)   SpO2 96%   BMI 22.50 kg/m  VS noted,  Constitutional: Pt appears in no apparent distress HENT: Head: NCAT.  Right Ear: External ear normal.  Left Ear: External ear normal.  Eyes: . Pupils are equal, round, and reactive to light. Conjunctivae and EOM are normal Neck: Normal range of motion. Neck supple.  Cardiovascular: Normal rate and regular rhythm.   Pulmonary/Chest: Effort normal and breath sounds without rales or wheezing.  Abd:  Soft, NT, ND, + BS Neurological: Pt is alert. At baseline confused , motor grossly intact Skin: Skin is warm. No rash, no LE edema Psychiatric: Pt behavior is normal. No agitation.      Assessment & Plan:

## 2016-09-02 DIAGNOSIS — C220 Liver cell carcinoma: Secondary | ICD-10-CM | POA: Diagnosis not present

## 2016-09-02 LAB — COMPLETE METABOLIC PANEL WITH GFR
ALT: 16 U/L (ref 6–29)
AST: 27 U/L (ref 10–35)
Albumin: 3.4 g/dL — ABNORMAL LOW (ref 3.6–5.1)
Alkaline Phosphatase: 46 U/L (ref 33–130)
BUN: 20 mg/dL (ref 7–25)
CHLORIDE: 101 mmol/L (ref 98–110)
CO2: 23 mmol/L (ref 20–31)
CREATININE: 1.64 mg/dL — AB (ref 0.60–0.93)
Calcium: 9 mg/dL (ref 8.6–10.4)
GFR, Est African American: 36 mL/min — ABNORMAL LOW (ref >=60)
GFR, Est Non African American: 31 mL/min — ABNORMAL LOW (ref >=60)
Glucose, Bld: 97 mg/dL (ref 65–99)
Potassium: 4.4 mmol/L (ref 3.5–5.3)
Sodium: 133 mmol/L — ABNORMAL LOW (ref 135–146)
Total Bilirubin: 0.4 mg/dL (ref 0.2–1.2)
Total Protein: 7.6 g/dL (ref 6.1–8.1)

## 2016-09-03 NOTE — Assessment & Plan Note (Signed)
stable overall by history and exam, recent data reviewed with pt, and pt to continue medical treatment as before,  to f/u any worsening symptoms or concerns Lab Results  Component Value Date   HGBA1C 5.8 02/26/2015

## 2016-09-03 NOTE — Assessment & Plan Note (Signed)
stable overall by history and exam, recent data reviewed with pt, and pt to continue medical treatment as before,  to f/u any worsening symptoms or concerns Lab Results  Component Value Date   CREATININE 1.64 (H) 09/02/2016

## 2016-09-03 NOTE — Assessment & Plan Note (Signed)
stable overall by history and exam, recent data reviewed with pt, and pt to continue medical treatment as before,  to f/u any worsening symptoms or concerns BP Readings from Last 3 Encounters:  08/31/16 136/70  06/01/16 122/72  05/16/16 (!) 229/103

## 2016-09-04 LAB — AFP TUMOR MARKER: AFP TUMOR MARKER: 10.6 ng/mL — AB (ref <6.1)

## 2016-09-06 ENCOUNTER — Other Ambulatory Visit: Payer: Medicare Other

## 2016-09-06 ENCOUNTER — Ambulatory Visit (HOSPITAL_COMMUNITY)
Admission: RE | Admit: 2016-09-06 | Discharge: 2016-09-06 | Disposition: A | Discharge status: Home / Self Care | Payer: Medicare Other | Source: Ambulatory Visit | Attending: Interventional Radiology | Admitting: Interventional Radiology

## 2016-09-06 DIAGNOSIS — C220 Liver cell carcinoma: Secondary | ICD-10-CM | POA: Diagnosis not present

## 2016-09-06 DIAGNOSIS — K8020 Calculus of gallbladder without cholecystitis without obstruction: Secondary | ICD-10-CM | POA: Diagnosis not present

## 2016-09-06 DIAGNOSIS — N281 Cyst of kidney, acquired: Secondary | ICD-10-CM | POA: Diagnosis not present

## 2016-09-06 MED ORDER — GADOXETATE DISODIUM 0.25 MMOL/ML IV SOLN
5.0000 mL | Freq: Once | INTRAVENOUS | Status: AC | PRN
  Administered 2016-09-06: 5 mL via INTRAVENOUS

## 2016-10-18 ENCOUNTER — Ambulatory Visit
Admission: RE | Admit: 2016-10-18 | Discharge: 2016-10-18 | Disposition: A | Discharge status: Home / Self Care | Payer: Medicare Other | Source: Ambulatory Visit | Attending: Interventional Radiology | Admitting: Interventional Radiology

## 2016-10-18 DIAGNOSIS — C220 Liver cell carcinoma: Secondary | ICD-10-CM

## 2016-10-18 HISTORY — PX: IR GENERIC HISTORICAL: IMG1180011

## 2016-10-18 NOTE — Progress Notes (Signed)
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  History of Present Illness: Sarah Harmon is a 70 y.o. female status post thermal ablation of a segment 8 right lobe hepatocellular carcinoma on 05/14/2015. She has done very well after the procedure with initial MRI studies on 07/13/2015 and 01/25/2016 demonstrating no evidence of residual enhancing tumor. No new liver lesions have been detected since that time with no further visualization of a potential subtle lesion at the dome of the liver on the original pretreatment MRI. Most recent AFP level is 10.6.  Past Medical History:  Diagnosis Date  . ALCOHOL ABUSE 07/13/2010  . ANXIETY 12/22/2009  . DEPRESSION 12/22/2009  . Disorders of urea cycle metabolism ([place]) 07/13/2010  . DJD (degenerative joint disease), cervical    patient denies on preop of 05/10/15   . FATIGUE 12/23/2010  . GALLSTONES 12/22/2009  . Gallstones   . Headache    occasional   . HYPERLIPIDEMIA 12/22/2009  . HYPERTENSION 12/22/2009  . HYPONATREMIA 12/22/2009  . 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 OSTEOPENIA 12/22/2009  . Pancytopenia 07/13/2010  . RENAL CYST, LEFT 12/22/2009  . VITAMIN D DEFICIENCY 12/23/2010  . WEIGHT LOSS 12/23/2010    Past Surgical History:  Procedure Laterality Date  . ABDOMINAL HYSTERECTOMY      Allergies: Review of patient's allergies indicates no known allergies.  Medications: Prior to Admission medications   Medication Sig Start Date End Date Taking? Authorizing Provider  alendronate (FOSAMAX) 70 MG tablet TAKE ONE TABLET BY MOUTH ONCE A WEEK TAKE  WITH  A  FULL  GLASS  OF  WATER  ON  A  EMPTY  STOMACH 07/26/16  Yes Biagio Borg, MD  ALPRAZolam Duanne Moron) 0.5 MG tablet TAKE ONE TABLET BY MOUTH AT BEDTIME AS NEEDED 05/17/16  Yes Biagio Borg, MD  amLODipine (NORVASC) 10 MG tablet TAKE ONE TABLET BY MOUTH ONCE DAILY 07/26/16  Yes Biagio Borg, MD  Cholecalciferol (VITAMIN D3) 1000 UNITS CAPS Take 1  capsule by mouth every morning.    Yes Historical Provider, MD  citalopram (CELEXA) 20 MG tablet Take 0.5 tablets (10 mg total) by mouth daily. 08/31/15  Yes Biagio Borg, MD  folic acid (FOLVITE) 1 MG tablet Take 1 tablet (1 mg total) by mouth daily. 06/01/16  Yes Biagio Borg, MD  hydrALAZINE (APRESOLINE) 100 MG tablet TAKE ONE TABLET BY MOUTH THREE TIMES DAILY 05/17/16  Yes Biagio Borg, MD  ibuprofen (ADVIL,MOTRIN) 200 MG tablet Take 1-2 tablets (200-400 mg total) by mouth every 6 (six) hours as needed for fever, headache, mild pain or moderate pain. 03/07/15  Yes Cherene Altes, MD  isosorbide dinitrate (ISORDIL) 10 MG tablet TAKE ONE TABLET BY MOUTH THREE TIMES DAILY WITH FOOD 05/17/16  Yes Biagio Borg, MD  ketotifen (ZADITOR) 0.025 % ophthalmic solution Place 1 drop into both eyes 2 (two) times daily.   Yes Historical Provider, MD  lactulose (CHRONULAC) 10 GM/15ML solution Take 30 mLs (20 g total) by mouth daily. 03/07/15  Yes Cherene Altes, MD  Multiple Vitamin (MULTIVITAMIN) tablet Take 1 tablet by mouth every morning.    Yes Historical Provider, MD  Polyethyl Glycol-Propyl Glycol (SYSTANE OP) Place 1 drop into both eyes every morning.    Yes Historical Provider, MD  propranolol (INDERAL) 20 MG tablet TAKE ONE TABLET BY MOUTH TWICE DAILY 07/26/16  Yes Biagio Borg, MD  FLUOCINOLONE ACETONIDE SCALP 0.01 % OIL Reported on  01/25/2016 11/21/15   Historical Provider, MD  ketoconazole (NIZORAL) 2 % shampoo  11/19/15   Historical Provider, MD     Family History  Problem Relation Age of Onset  . Breast cancer Sister   . Lung cancer Brother   . Gastric cancer Sister   . Lung cancer Sister   . Breast cancer Other   . Colon cancer Neg Hx   . Colon polyps Neg Hx   . Rectal cancer Neg Hx   . Stomach cancer Neg Hx   . Esophageal cancer Neg Hx     Social History   Social History  . Marital status: Widowed    Spouse name: N/A  . Number of children: 4  . Years of education: N/A    Occupational History  . retired    Social History Main Topics  . Smoking status: Never Smoker  . Smokeless tobacco: Never Used  . Alcohol use 1.8 - 2.4 oz/week    3 - 4 Cans of beer per week     Comment: she quit 02/2015   . Drug use: No  . Sexual activity: Not on file   Other Topics Concern  . Not on file   Social History Narrative   Widowed, grown son lives with her   Retired from Manufacturing engineer   Has total of #5 children   Enjoys watching TV and planting flowers    ECOG Status: 0 - Asymptomatic  Review of Systems: A 12 point ROS discussed and pertinent positives are indicated in the HPI above.  All other systems are negative.  Review of Systems  Constitutional: Negative.   Respiratory: Negative.   Cardiovascular: Negative.   Gastrointestinal: Negative.   Genitourinary: Negative.   Musculoskeletal: Negative.   Neurological: Negative.     Vital Signs: BP (!) 221/149 (BP Location: Right Arm, Patient Position: Sitting, Cuff Size: Normal)   Pulse 68   Temp 98.4 F (36.9 C) (Oral)   Resp 14   Ht _0  (1.6 m)   Wt 127 lb (57.6 kg)   SpO2 98%   BMI 22.50 kg/m   Physical Exam  Constitutional: She is oriented to person, place, and time. She appears well-developed and well-nourished. No distress.  Abdominal: Soft. She exhibits no distension and no mass. There is no tenderness. There is no rebound and no guarding.  Neurological: She is alert and oriented to person, place, and time.  Skin: She is not diaphoretic.  Nursing note and vitals reviewed.   Imaging: No results found.  Labs:  CBC:  Recent Labs  11/10/15 1218 05/16/16 1027  WBC 5.3 4.0  HGB 12.3 14.5  HCT 37.3 43.5  41.0  PLT 168.0 148    COAGS:  Recent Labs  11/10/15 1218 03/17/16 0857  INR 1.0 1.0    BMP:  Recent Labs  11/10/15 1218 01/15/16 0820 03/17/16 0857 05/16/16 1027 09/02/16 0838  NA 134* 131* 134* 138 133*  K 3.9 4.6 4.0 4.6 4.4  CL 100 95* 99  --   101  CO2 _1 GLUCOSE 101* 98 87 104 97  BUN _2 13.1 20  CALCIUM 10.4 9.4 9.6 9.7 9.0  CREATININE 1.20 1.24* 1.40* 1.3* 1.64*  GFRNONAA  --  44*  --   --  31*  GFRAA  --  50*  --   --  36*    LIVER FUNCTION TESTS:  Recent Labs  01/15/16 0820 03/17/16 0857 05/16/16 1027 09/02/16  2257  BILITOT 0.4 0.3 0.41 0.4  AST _0 ALT _1 ALKPHOS 48 53 63 46  PROT 7.8 7.8 8.8* 7.6  ALBUMIN 3.4* 3.6 3.3* 3.4*    TUMOR MARKERS:  Recent Labs  01/15/16 0820 05/16/16 1027 09/02/16 0838  AFPTM 7.8* 9.4* 10.6*    Assessment and Plan:  I met with Sarah Harmon and her daughter. We reviewed the most recent follow-up MRI dated 09/06/2016. This shows slight further reduction in size of the ablation defect at the site of the treated hepatocellular carcinoma. No abnormal enhancement is seen at the ablation site. No new enhancing lesions are identified. Given some rise in AFP since January from 7.8 to 10.6, I did recommend that we obtain a follow-up MRI in 6 months. She is scheduled to follow-up with [facility] around January 2018.   Electronically SignedAletta Edouard T 10/18/2016, 8:21 AM   I spent a total of 15 Minutes in face to face in clinical consultation, greater than 50% of which was counseling/coordinating care for hepatocellular carcinoma.

## 2016-10-23 ENCOUNTER — Other Ambulatory Visit: Payer: Self-pay | Admitting: Internal Medicine

## 2016-10-27 ENCOUNTER — Telehealth: Payer: Self-pay | Admitting: *Deleted

## 2016-10-27 NOTE — Telephone Encounter (Signed)
I will send a scheduling message to see her with lab in 04/2017. Thanks  Truitt Merle MD

## 2016-10-27 NOTE — Telephone Encounter (Signed)
Dtr. [name] called and left message.  Does her mother, Sarah Harmon need to have a MRI?  She just had one in September.  Per Dr. Ernestina Penna note from 05/16/16, if patient has MRI and sees Dr. Kathlene Cote then she should return in one year to Dr. Burr Medico.  [name] will call and cancel the MRI.  I will ask Dr. Burr Medico to order MRI, lab and visit with her for May 2018

## 2016-10-31 ENCOUNTER — Ambulatory Visit (HOSPITAL_COMMUNITY): Admission: RE | Admit: 2016-10-31 | Payer: Medicare Other | Source: Ambulatory Visit

## 2016-11-06 ENCOUNTER — Other Ambulatory Visit: Payer: Medicare Other

## 2016-11-07 ENCOUNTER — Encounter: Payer: Self-pay | Admitting: Interventional Radiology

## 2016-11-13 ENCOUNTER — Ambulatory Visit: Payer: Medicare Other | Admitting: Hematology

## 2016-11-13 ENCOUNTER — Other Ambulatory Visit: Payer: Self-pay | Admitting: Internal Medicine

## 2016-11-23 ENCOUNTER — Other Ambulatory Visit: Payer: Self-pay | Admitting: Internal Medicine

## 2016-11-23 NOTE — Telephone Encounter (Signed)
Done hardcopy to Corinne  

## 2016-11-23 NOTE — Telephone Encounter (Signed)
faxed

## 2016-12-04 DIAGNOSIS — C220 Liver cell carcinoma: Secondary | ICD-10-CM | POA: Diagnosis not present

## 2016-12-04 DIAGNOSIS — K7030 Alcoholic cirrhosis of liver without ascites: Secondary | ICD-10-CM | POA: Diagnosis not present

## 2016-12-26 ENCOUNTER — Other Ambulatory Visit: Payer: Self-pay | Admitting: Internal Medicine

## 2017-01-05 ENCOUNTER — Other Ambulatory Visit: Payer: Self-pay | Admitting: Internal Medicine

## 2017-02-08 ENCOUNTER — Other Ambulatory Visit: Payer: Self-pay | Admitting: Internal Medicine

## 2017-02-21 ENCOUNTER — Other Ambulatory Visit: Payer: Self-pay | Admitting: Internal Medicine

## 2017-02-27 NOTE — Progress Notes (Signed)
Pre visit review using our clinic review tool, if applicable. No additional management support is needed unless otherwise documented below in the visit note. 

## 2017-02-27 NOTE — Progress Notes (Signed)
Subjective:   Sarah Harmon is a 71 y.o. female who presents for an Initial Medicare Annual Wellness Visit.  Review of Systems    No ROS.  Medicare Wellness Visit.   Cardiac Risk Factors include: hypertension;advanced age (>38men, >37 women);family history of premature cardiovascular disease  Sleep patterns: feels rested on waking and sleeps 7 hours nightly.   Home Safety/Smoke Alarms:  Feels safe in home. Smoke alarms in place.   Living environment; residence and Firearm Safety: 1-story house/ trailer, no firearms.Lives with son, has good family support Seat Belt Safety/Bike Helmet: Wears seat belt.   Counseling:   Eye Exam- Last exam several years ago, patient will make an appointment  Dental- dentures  Female:   Pap-  N/A     Mammo-  Last 03/27/16,  BI-RADS CATEGORY  1: Negative     Dexa scan-   Last 07/31/13, -2.3, treated with fosamax      CCS- Last 12/24/15, mild diverticulosis, recall 10 years       Objective:    Today's Vitals   02/28/17 0821  BP: (!) 148/72  Pulse: 86  Temp: 98.8 F (37.1 C)  SpO2: 99%  Weight: 130 lb (59 kg)  Height: 5\' 3"  (1.6 m)   Body mass index is 23.03 kg/m.   Current Medications (verified) Outpatient Encounter Prescriptions as of 02/28/2017  Medication Sig  . alendronate (FOSAMAX) 70 MG tablet TAKE ONE TABLET BY MOUTH ONCE A WEEK, TAKE WITH A FULL GLASS OF WATER ON AN EMPTY STOMACH  . ALPRAZolam (XANAX) 0.5 MG tablet TAKE ONE TABLET BY MOUTH AT BEDTIME AS NEEDED  . amLODipine (NORVASC) 10 MG tablet Take 1 tablet (10 mg total) by mouth daily. Yearly physical w/labs due in June must see MD for refills  . Cholecalciferol (VITAMIN D3) 1000 UNITS CAPS Take 1 capsule by mouth every morning.   . citalopram (CELEXA) 20 MG tablet TAKE ONE-HALF TABLET BY MOUTH ONCE DAILY  . FLUOCINOLONE ACETONIDE SCALP 0.01 % OIL Reported on 02/17/3556  . folic acid (FOLVITE) 1 MG tablet Take 1 tablet (1 mg total) by mouth daily.  . hydrALAZINE  (APRESOLINE) 100 MG tablet TAKE ONE TABLET BY MOUTH THREE TIMES DAILY  . ibuprofen (ADVIL,MOTRIN) 200 MG tablet Take 1-2 tablets (200-400 mg total) by mouth every 6 (six) hours as needed for fever, headache, mild pain or moderate pain.  . isosorbide dinitrate (ISORDIL) 10 MG tablet TAKE ONE TABLET BY MOUTH THREE TIMES DAILY WITH FOOD  . ketoconazole (NIZORAL) 2 % shampoo   . ketotifen (ZADITOR) 0.025 % ophthalmic solution Place 1 drop into both eyes 2 (two) times daily.  Marland Kitchen lactulose (CHRONULAC) 10 GM/15ML solution Take 30 mLs (20 g total) by mouth daily.  . Multiple Vitamin (MULTIVITAMIN) tablet Take 1 tablet by mouth every morning.   Vladimir Faster Glycol-Propyl Glycol (SYSTANE OP) Place 1 drop into both eyes every morning.   . propranolol (INDERAL) 20 MG tablet TAKE ONE TABLET BY MOUTH TWICE DAILY  . [DISCONTINUED] CONSTULOSE 10 GM/15ML solution TAKE 30 MLS BY MOUTH ONCE DAILY  . [DISCONTINUED] lactulose (CHRONULAC) 10 GM/15ML solution Take 30 mLs (20 g total) by mouth daily.   No facility-administered encounter medications on file as of 02/28/2017.     Allergies (verified) Patient has no known allergies.   History: Past Medical History:  Diagnosis Date  . ALCOHOL ABUSE 07/13/2010  . ANXIETY 12/22/2009  . DEPRESSION 12/22/2009  . 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 DJD (degenerative joint  disease), cervical    patient denies on preop of 05/10/15   . FATIGUE 12/23/2010  . GALLSTONES 12/22/2009  . Gallstones   . Headache    occasional   . HYPERLIPIDEMIA 12/22/2009  . HYPERTENSION 12/22/2009  . HYPONATREMIA 12/22/2009  . 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 OSTEOPENIA 12/22/2009  . Pancytopenia 07/13/2010  . RENAL CYST, LEFT 12/22/2009  . VITAMIN D DEFICIENCY 12/23/2010  . WEIGHT LOSS 12/23/2010   Past Surgical History:  Procedure Laterality Date  . ABDOMINAL HYSTERECTOMY    . IR GENERIC HISTORICAL  10/18/2016   IR RADIOLOGIST EVAL & MGMT 10/18/2016 [name], MD [facility] INTERV RAD    Family History  Problem Relation Age of Onset  . Lung cancer Brother   . Breast cancer Other   . Breast cancer Sister   . Gastric cancer Sister   . Lung cancer Sister   . Colon cancer Neg Hx   . Colon polyps Neg Hx   . Rectal cancer Neg Hx   . Stomach cancer Neg Hx   . Esophageal cancer Neg Hx    Social History   Occupational History  . retired    Social History Main Topics  . Smoking status: Never Smoker  . Smokeless tobacco: Never Used  . Alcohol use 1.8 - 2.4 oz/week    3 - 4 Cans of beer per week     Comment: she quit 02/2015   . Drug use: No  . Sexual activity: Not on file    Tobacco Counseling Counseling given: Not Answered   Activities of Daily Living In your present state of health, do you have any difficulty performing the following activities: 02/28/2017  Hearing? N  Vision? N  Difficulty concentrating or making decisions? N  Walking or climbing stairs? N  Dressing or bathing? N  Doing errands, shopping? N  Preparing Food and eating ? N  Using the Toilet? N  In the past six months, have you accidently leaked urine? N  Do you have problems with loss of bowel control? N  Managing your Medications? N  Managing your Finances? N  Housekeeping or managing your Housekeeping? N  Some recent data might be hidden    Immunizations and Health Maintenance Immunization History  Administered Date(s) Administered  . Hepatitis B, ped/adol 03/29/2016, 05/01/2016  . Pneumococcal Conjugate-13 02/26/2015  . Pneumococcal Polysaccharide-23 07/13/2010  . Td 05/18/2010   There are no preventive care reminders to display for this patient.  Patient Care Team: Biagio Borg, MD as PCP - General Truitt Merle, MD as Consulting Physician (Hematology)  Indicate any recent Medical Services you may have received from other than [facility] providers in the past year (date may be approximate).     Assessment:   This is a routine wellness examination for Sarah Harmon. Physical assessment  deferred to PCP.   Hearing/Vision screen Hearing Screening Comments: Able to hear conversational tones w/o difficulty. No issues reported. Passed whisper test Vision Screening Comments: Wears glasses  Dietary issues and exercise activities discussed: Current Exercise Habits: The patient does not participate in regular exercise at present, Exercise limited by: None identified  Diet (meal preparation, eat out, water intake, caffeinated beverages, dairy products, fruits and vegetables): in general, a "healthy" diet  , well balanced Eats a variety of foods and states she includes many vegetables and fruits. Limits salt and supplements with Ensure. Patient drinks 3 16 ounce bottles of water per day. sd    Goals    . maintain current health  status          Exercise by dancing, continue to eat healthy, continue to drink 6-8 cups of water per day.      Depression Screen PHQ 2/9 Scores 02/28/2017 02/28/2017 03/01/2016 08/31/2015 02/26/2015 08/06/2014 07/29/2013  PHQ - 2 Score 1 1 0 0 0 1 4    Fall Risk Fall Risk  02/28/2017 02/28/2017 03/01/2016 08/31/2015 02/26/2015  Falls in the past year? No No No No No    Cognitive Function: MMSE - Mini Mental State Exam 02/28/2017  Orientation to time 5  Orientation to Place 5  Registration 3  Attention/ Calculation 5  Recall 2  Language- name 2 objects 2  Language- repeat 1  Language- follow 3 step command 3  Language- read & follow direction 1  Write a sentence 1  Copy design 1  Total score 29        Screening Tests Health Maintenance  Topic Date Due  . INFLUENZA VACCINE  03/17/2017 (Originally 07/18/2016)  . MAMMOGRAM  03/27/2018  . TETANUS/TDAP  05/18/2020  . COLONOSCOPY  12/23/2025  . DEXA SCAN  Completed  . Hepatitis C Screening  Completed  . PNA vac Low Risk Adult  Completed      Plan:    Continue to eat heart healthy diet (full of fruits, vegetables, whole grains, lean protein, water--limit salt, fat, and sugar intake) and increase  physical activity as tolerated.  Start doing brain stimulating activities (puzzles, reading, adult coloring books, staying active) to keep memory sharp.   During the course of the visit, Linsey was educated and counseled about the following appropriate screening and preventive services:   Vaccines to include Pneumoccal, Influenza, Hepatitis B, Td, Zostavax, HCV  Cardiovascular disease screening  Colorectal cancer screening  Bone density screening  Diabetes screening  Glaucoma screening  Mammography/PAP  Nutrition counseling  Patient Instructions (the written plan) were given to the patient.    Michiel Cowboy, RN   02/28/2017   Medical screening examination/treatment/procedure(s) were performed by non-physician practitioner and as supervising physician I was immediately available for consultation/collaboration. I agree with above. Michiel Cowboy, RN

## 2017-02-28 ENCOUNTER — Encounter: Payer: Self-pay | Admitting: Internal Medicine

## 2017-02-28 ENCOUNTER — Ambulatory Visit (INDEPENDENT_AMBULATORY_CARE_PROVIDER_SITE_OTHER): Payer: Medicare Other | Admitting: Internal Medicine

## 2017-02-28 ENCOUNTER — Other Ambulatory Visit (INDEPENDENT_AMBULATORY_CARE_PROVIDER_SITE_OTHER): Payer: Medicare Other

## 2017-02-28 VITALS — BP 148/72 | HR 86 | Temp 98.8°F | Ht 63.0 in | Wt 130.0 lb

## 2017-02-28 DIAGNOSIS — N183 Chronic kidney disease, stage 3 unspecified: Secondary | ICD-10-CM

## 2017-02-28 DIAGNOSIS — Z0000 Encounter for general adult medical examination without abnormal findings: Secondary | ICD-10-CM

## 2017-02-28 DIAGNOSIS — I10 Essential (primary) hypertension: Secondary | ICD-10-CM | POA: Diagnosis not present

## 2017-02-28 DIAGNOSIS — E2839 Other primary ovarian failure: Secondary | ICD-10-CM | POA: Diagnosis not present

## 2017-02-28 DIAGNOSIS — M810 Age-related osteoporosis without current pathological fracture: Secondary | ICD-10-CM

## 2017-02-28 DIAGNOSIS — R7302 Impaired glucose tolerance (oral): Secondary | ICD-10-CM

## 2017-02-28 DIAGNOSIS — E785 Hyperlipidemia, unspecified: Secondary | ICD-10-CM

## 2017-02-28 LAB — LIPID PANEL
Cholesterol: 198 mg/dL (ref 0–200)
HDL: 93.1 mg/dL (ref >39.00)
LDL CALC: 77 mg/dL (ref 0–99)
NONHDL: 104.99
Total CHOL/HDL Ratio: 2
Triglycerides: 142 mg/dL (ref 0.0–149.0)
VLDL: 28.4 mg/dL (ref 0.0–40.0)

## 2017-02-28 LAB — CBC WITH DIFFERENTIAL/PLATELET
BASOS PCT: 0.7 % (ref 0.0–3.0)
Basophils Absolute: 0 10*3/uL (ref 0.0–0.1)
EOS ABS: 0.1 10*3/uL (ref 0.0–0.7)
EOS PCT: 3.1 % (ref 0.0–5.0)
HEMATOCRIT: 36.5 % (ref 36.0–46.0)
HEMOGLOBIN: 12.4 g/dL (ref 12.0–15.0)
LYMPHS PCT: 13.6 % (ref 12.0–46.0)
Lymphs Abs: 0.6 10*3/uL — ABNORMAL LOW (ref 0.7–4.0)
MCHC: 33.8 g/dL (ref 30.0–36.0)
MCV: 87.5 fl (ref 78.0–100.0)
MONO ABS: 0.7 10*3/uL (ref 0.1–1.0)
Monocytes Relative: 14.1 % — ABNORMAL HIGH (ref 3.0–12.0)
NEUTROS ABS: 3.2 10*3/uL (ref 1.4–7.7)
Neutrophils Relative %: 68.5 % (ref 43.0–77.0)
PLATELETS: 152 10*3/uL (ref 150.0–400.0)
RBC: 4.18 Mil/uL (ref 3.87–5.11)
RDW: 14 % (ref 11.5–15.5)
WBC: 4.7 10*3/uL (ref 4.0–10.5)

## 2017-02-28 LAB — URINALYSIS, ROUTINE W REFLEX MICROSCOPIC
Bilirubin Urine: NEGATIVE
Hgb urine dipstick: NEGATIVE
KETONES UR: NEGATIVE
Nitrite: NEGATIVE
RBC / HPF: NONE SEEN (ref 0–?)
TOTAL PROTEIN, URINE-UPE24: 100 — AB
URINE GLUCOSE: NEGATIVE
UROBILINOGEN UA: 0.2 (ref 0.0–1.0)
pH: 6 (ref 5.0–8.0)

## 2017-02-28 LAB — BASIC METABOLIC PANEL
BUN: 23 mg/dL (ref 6–23)
CALCIUM: 10 mg/dL (ref 8.4–10.5)
CO2: 22 meq/L (ref 19–32)
CREATININE: 1.72 mg/dL — AB (ref 0.40–1.20)
Chloride: 96 mEq/L (ref 96–112)
GFR: 37.45 mL/min — AB (ref >60.00)
Glucose, Bld: 125 mg/dL — ABNORMAL HIGH (ref 70–99)
Potassium: 4.2 mEq/L (ref 3.5–5.1)
SODIUM: 129 meq/L — AB (ref 135–145)

## 2017-02-28 LAB — HEPATIC FUNCTION PANEL
ALBUMIN: 3.6 g/dL (ref 3.5–5.2)
ALT: 12 U/L (ref 0–35)
AST: 21 U/L (ref 0–37)
Alkaline Phosphatase: 50 U/L (ref 39–117)
BILIRUBIN DIRECT: 0 mg/dL (ref 0.0–0.3)
TOTAL PROTEIN: 8 g/dL (ref 6.0–8.3)
Total Bilirubin: 0.3 mg/dL (ref 0.2–1.2)

## 2017-02-28 LAB — TSH: TSH: 1.88 u[IU]/mL (ref 0.35–4.50)

## 2017-02-28 LAB — VITAMIN D 25 HYDROXY (VIT D DEFICIENCY, FRACTURES): VITD: 59.26 ng/mL (ref 30.00–100.00)

## 2017-02-28 LAB — HEMOGLOBIN A1C: HEMOGLOBIN A1C: 5.7 % (ref 4.6–6.5)

## 2017-02-28 MED ORDER — LACTULOSE 10 GM/15ML PO SOLN: 20.0000 g | Freq: Every day | ORAL | 3 refills | Status: DC

## 2017-02-28 NOTE — Patient Instructions (Addendum)
Start to eat heart healthy diet (full of fruits, vegetables, whole grains, lean protein, water--limit salt, fat, and sugar intake) and  increase physical activity as tolerated.  Start doing brain stimulating activities (puzzles, reading, adult coloring books, staying active) to keep memory sharp.    Sarah Harmon , Thank you for taking time to come for your Medicare Wellness Visit. I appreciate your ongoing commitment to your health goals. Please review the following plan we discussed and let me know if I can assist you in the future.   These are the goals we discussed: Goals    . maintain current health status          Exercise by dancing, continue to eat healthy, continue to drink 6-8 cups of water per day.       This is a list of the screening recommended for you and due dates:  Health Maintenance  Topic Date Due  . Flu Shot  03/17/2017*  . Mammogram  03/27/2018  . Tetanus Vaccine  05/18/2020  . Colon Cancer Screening  12/23/2025  . DEXA scan (bone density measurement)  Completed  .  Hepatitis C: One time screening is recommended by Center for Disease Control  (CDC) for  adults born from [birth year] through 1967.   Completed  . Pneumonia vaccines  Completed  *Topic was postponed. The date shown is not the original due date.    OK to stop the fosamax  Please schedule the bone density test before leaving today at the scheduling desk (where you check out)  Please continue all other medications as before, and refills have been done if requested.  Please have the pharmacy call with any other refills you may need.  Please continue your efforts at being more active, low cholesterol diet, and weight control.  You are otherwise up to date with prevention measures today.  Please keep your appointments with your specialists as you may have planned - Dr Burr Medico in May  Please go to the LAB in the Basement (turn left off the elevator) for the tests to be done today  You will be contacted by  phone if any changes need to be made immediately.  Otherwise, you will receive a letter about your results with an explanation, but please check with MyChart first.  Please remember to sign up for MyChart if you have not done so, as this will be important to you in the future with finding out test results, communicating by private email, and scheduling acute appointments online when needed.  Please return in 6 months, or sooner if needed

## 2017-02-28 NOTE — Progress Notes (Signed)
Subjective:    Patient ID: Sarah Harmon, female    DOB: March 10, 1946, 71 y.o.   MRN: [id number]  HPI   Here for yearly f/u;  Overall doing ok;  Pt denies Chest pain, worsening SOB, DOE, wheezing, orthopnea, PND, worsening LE edema, palpitations, dizziness or syncope.  Pt denies neurological change such as new headache, facial or extremity weakness.  Pt denies polydipsia, polyuria, or low sugar symptoms. Pt states overall good compliance with treatment and medications, good tolerability, and has been trying to follow appropriate diet.  Pt denies worsening depressive symptoms, suicidal ideation or panic. No fever, night sweats, wt loss, loss of appetite, or other constitutional symptoms.  Pt states good ability with ADL's, has low fall risk, home safety reviewed and adequate, no other significant changes in hearing or vision, and not active with exercise, is maintaining wt.  Wt Readings from Last 3 Encounters:  02/28/17 130 lb (59 kg)  10/18/16 127 lb (57.6 kg)  08/31/16 127 lb (57.6 kg)  Has been on fosamax now 5 yrs, due for dxa f/u, and would consider prolia if needed  Also in follow-up GI was to see her back in 6 month with lab and abdomen MRI,  but later changed to 1 year - due may 2018.  Past Medical History:  Diagnosis Date  . ALCOHOL ABUSE 07/13/2010  . ANXIETY 12/22/2009  . DEPRESSION 12/22/2009  . 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 DJD (degenerative joint disease), cervical    patient denies on preop of 05/10/15   . FATIGUE 12/23/2010  . GALLSTONES 12/22/2009  . Gallstones   . Headache    occasional   . HYPERLIPIDEMIA 12/22/2009  . HYPERTENSION 12/22/2009  . HYPONATREMIA 12/22/2009  . 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 OSTEOPENIA 12/22/2009  . Pancytopenia 07/13/2010  . RENAL CYST, LEFT 12/22/2009  . VITAMIN D DEFICIENCY 12/23/2010  . WEIGHT LOSS 12/23/2010   Past Surgical History:  Procedure Laterality Date  . ABDOMINAL HYSTERECTOMY    . IR GENERIC HISTORICAL  10/18/2016   IR  RADIOLOGIST EVAL & MGMT 10/18/2016 [name], MD [facility] INTERV RAD    reports that she has never smoked. She has never used smokeless tobacco. She reports that she drinks about 1.8 - 2.4 oz of alcohol per week . She reports that she does not use drugs. family history includes Breast cancer in her other and sister; Gastric cancer in her sister; Lung cancer in her brother and sister. No Known Allergies Current Outpatient Prescriptions on File Prior to Visit  Medication Sig Dispense Refill  . alendronate (FOSAMAX) 70 MG tablet TAKE ONE TABLET BY MOUTH ONCE A WEEK, TAKE WITH A FULL GLASS OF WATER ON AN EMPTY STOMACH 12 tablet 2  . ALPRAZolam (XANAX) 0.5 MG tablet TAKE ONE TABLET BY MOUTH AT BEDTIME AS NEEDED 90 tablet 1  . amLODipine (NORVASC) 10 MG tablet Take 1 tablet (10 mg total) by mouth daily. Yearly physical w/labs due in June must see MD for refills 90 tablet 0  . Cholecalciferol (VITAMIN D3) 1000 UNITS CAPS Take 1 capsule by mouth every morning.     . citalopram (CELEXA) 20 MG tablet TAKE ONE-HALF TABLET BY MOUTH ONCE DAILY 90 tablet 1  . FLUOCINOLONE ACETONIDE SCALP 0.01 % OIL Reported on 7/0/9628    . folic acid (FOLVITE) 1 MG tablet Take 1 tablet (1 mg total) by mouth daily. 90 tablet 3  . hydrALAZINE (APRESOLINE) 100 MG tablet TAKE ONE TABLET BY MOUTH THREE TIMES  DAILY 270 tablet 2  . ibuprofen (ADVIL,MOTRIN) 200 MG tablet Take 1-2 tablets (200-400 mg total) by mouth every 6 (six) hours as needed for fever, headache, mild pain or moderate pain. 30 tablet 0  . isosorbide dinitrate (ISORDIL) 10 MG tablet TAKE ONE TABLET BY MOUTH THREE TIMES DAILY WITH FOOD 270 tablet 2  . ketoconazole (NIZORAL) 2 % shampoo     . ketotifen (ZADITOR) 0.025 % ophthalmic solution Place 1 drop into both eyes 2 (two) times daily.    . Multiple Vitamin (MULTIVITAMIN) tablet Take 1 tablet by mouth every morning.     Vladimir Faster Glycol-Propyl Glycol (SYSTANE OP) Place 1 drop into both eyes every morning.     .  propranolol (INDERAL) 20 MG tablet TAKE ONE TABLET BY MOUTH TWICE DAILY 60 tablet 3   No current facility-administered medications on file prior to visit.    Review of Systems Constitutional: Negative for increased diaphoresis, or other activity, appetite or siginficant weight change other than noted HENT: Negative for worsening hearing loss, ear pain, facial swelling, mouth sores and neck stiffness.   Eyes: Negative for other worsening pain, redness or visual disturbance.  Respiratory: Negative for choking or stridor Cardiovascular: Negative for other chest pain and palpitations.  Gastrointestinal: Negative for worsening diarrhea, blood in stool, or abdominal distention Genitourinary: Negative for hematuria, flank pain or change in urine volume.  Musculoskeletal: Negative for myalgias or other joint complaints.  Skin: Negative for other color change and wound or drainage.  Neurological: Negative for syncope and numbness. other than noted Hematological: Negative for adenopathy. or other swelling Psychiatric/Behavioral: Negative for hallucinations, SI, self-injury, decreased concentration or other worsening agitation.  All other system neg per pt    Objective:   Physical Exam BP (!) 148/72   Pulse 86   Temp 98.8 F (37.1 C)   Ht 5\' 3"  (1.6 m)   Wt 130 lb (59 kg)   SpO2 99%   BMI 23.03 kg/m  VS noted, normal wt, not ill appearing Constitutional: Pt is oriented to person, place, and time. Appears well-developed and well-nourished, in no significant distress Head: Normocephalic and atraumatic  Eyes: Conjunctivae and EOM are normal. Pupils are equal, round, and reactive to light Right Ear: External ear normal.  Left Ear: External ear normal Nose: Nose normal.  Mouth/Throat: Oropharynx is clear and moist  Neck: Normal range of motion. Neck supple. No JVD present. No tracheal deviation present or significant neck LA or mass Cardiovascular: Normal rate, regular rhythm, normal heart  sounds and intact distal pulses.   Pulmonary/Chest: Effort normal and breath sounds without rales or wheezing  Abdominal: Soft. Bowel sounds are normal. NT. No HSM  Musculoskeletal: Normal range of motion. Exhibits no edema Lymphadenopathy: Has no cervical adenopathy.  Neurological: Pt is alert and oriented to person, place, and time. Pt has normal reflexes. No cranial nerve deficit. Motor grossly intact Skin: Skin is warm and dry. No rash noted or new ulcers Psychiatric:  Has mild nervous mood and affect. Behavior is normal.  No other exam findings    Assessment & Plan:

## 2017-03-01 LAB — PTH, INTACT AND CALCIUM
CALCIUM: 9.8 mg/dL (ref 8.6–10.4)
PTH: 43 pg/mL (ref 14–64)

## 2017-03-04 NOTE — Assessment & Plan Note (Signed)
For dxa f/u exam, d/c fosamax as has completed recommended tx, and to consider prolia if indicated

## 2017-03-04 NOTE — Assessment & Plan Note (Signed)
Mild elevation for unclear reasons, overall improved from previous, decilnes med change, o/w stable overall by history and exam, recent data reviewed with pt, and pt to continue medical treatment as before,  to f/u any worsening symptoms or concerns BP Readings from Last 3 Encounters:  02/28/17 (!) 148/72  10/18/16 (!) 221/149  08/31/16 136/70

## 2017-03-04 NOTE — Assessment & Plan Note (Signed)
stable overall by history and exam, recent data reviewed with pt, and pt to continue medical treatment as before,  to f/u any worsening symptoms or concerns Lab Results  Component Value Date   HGBA1C 5.7 02/28/2017

## 2017-03-04 NOTE — Assessment & Plan Note (Signed)
stable overall by history and exam, recent data reviewed with pt, and pt to continue medical treatment as before,  to f/u any worsening symptoms or concerns Lab Results  Component Value Date   LDLCALC 77 02/28/2017

## 2017-03-04 NOTE — Assessment & Plan Note (Signed)
stable overall by history and exam, recent data reviewed with pt, and pt to continue medical treatment as before,  to f/u any worsening symptoms or concerns Lab Results  Component Value Date   CREATININE 1.72 (H) 02/28/2017

## 2017-04-17 ENCOUNTER — Other Ambulatory Visit: Payer: Self-pay | Admitting: Internal Medicine

## 2017-04-17 DIAGNOSIS — Z1231 Encounter for screening mammogram for malignant neoplasm of breast: Secondary | ICD-10-CM

## 2017-04-18 ENCOUNTER — Ambulatory Visit
Admission: RE | Admit: 2017-04-18 | Discharge: 2017-04-18 | Disposition: A | Discharge status: Home / Self Care | Payer: Medicare Other | Source: Ambulatory Visit | Attending: Internal Medicine | Admitting: Internal Medicine

## 2017-04-18 DIAGNOSIS — Z1231 Encounter for screening mammogram for malignant neoplasm of breast: Secondary | ICD-10-CM

## 2017-04-26 NOTE — Progress Notes (Signed)
[place]  [phone] [phone]  Clinic Follow Up Note   Patient Care Team: Biagio Borg, MD as PCP - Charissa Bash, MD as Consulting Physician (Hematology) 04/30/2017  CHIEF COMPLAINTS:  Follow up stage I Hepatocellular carcinoma  Oncology History   Hepatocellular carcinoma   Staging form: Liver (Excluding Intrahepatic Bile Ducts), AJCC 7th Edition     Clinical: Stage I (T1, N0, M0) - Unsigned       Hepatocellular carcinoma ([place])   03/01/2015 Tumor Marker    AFP 8.4      03/01/2015 Imaging    Liver MRI showed home 1.5 cm hyperenhancing T2 bright lesion within the right hepatic lobe, consistent with hepatocellular carcinoma. There is an indeterminate 1cm T2 bright lesion within the hepatic dome, w/o definitive arterial phase enhancement       03/01/2015 Initial Diagnosis    Hepatocellular carcinoma      05/14/2015 Procedure    CT guided thermal ablation of hepatocellular carcinoma      12/24/2015 Procedure    Upper Endoscopy: 12/24/15 ENDOSCOPIC IMPRESSION: There was mild to moderate erosive gastritis (mid and distal stomach). This was not classic appearing for portal gastropathy. Biopsies were taken from antrum and body to check for H. pylori, portal gastropathy, GAVE. The examination was otherwise normal. There were no gastric or esophageal varices      12/24/2015 Procedure    Colonoscopy: 12/24/15 ENDOSCOPIC IMPRESSION:  1. Mild diverticulosis was noted in the left colon 2. The examination was otherwise normal RADIOGRAPHIC STUDIES: I have personally reviewed the radiological images as listed and agreed with the findings in the report.       01/25/2016 Imaging    liver MRI with and without contrast showed further decreased in size of ablation defect within the segment 8 of the liver compatible with treated tumor,no new enhancing liver lesions identified. Liver cirrhosis.      09/06/2016 Imaging     MRI ABD  09/06/16 IMPRESSION: 1. Minimal further size reduction at the ablation site, no recurrence or new focus of hepatocellular carcinoma identified. 2. Complex cystic lesion of the left kidney upper pole with some thin internal septations but no measurable enhancement, has minimally increased in size over the past 9 years. High likelihood that this is a benign lesion, categorized as Bosniak category IIF in the past. I would suggest ancillary surveillance at the time of liver follow up. 3. Mild chronic gallbladder wall thickening, with a stone lodged in the neck of the gallbladder. Mild chronic gallbladder distention.       HISTORY OF PRESENTING ILLNESS:  Sarah Harmon 71 y.o. female is here because of abnormal liver MRI, which is highly suspicious for [facility].  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 No history of ascites or GI bleeding. She has been followed I her primary care physician since then, has not been read by GI. she did quit drinking a few times, and started drinking again about 2 years ago.  She was seen by PCP for routine checkup and was found to have low sodium, she was referred to Hospital and was admitted. She had abdominal ultrasound done on 05/31/2015 which showed liver cirrhosis and a questionable mass in the right lobe measuring 2 cm. She underwent a liver MRI on same day, which showed a 1.5 cm enhancing lesion in the right lobe of liver, and an additional 1.0 cm indeterminate lesion  in the liver. AFP was found to be elevated at 8.4. No liver biopsy was performed. She was subsequently referred to our cancer center for further evaluation.  She lives with her son at home. She is able to do all her ADLs, and some light housework. She goes out for shopping with her daughters, does not drive. She denies any abdominal discomfort or bloating, no nausea, vomiting, diarrhea or constipation. Her  appetite and energy level is moderate, unchanged from before. No recent weight loss.  CURRENT THERAPY: Surveillance  INTERIM HISTORY Sarah Harmon returns for follow-up. She presents to the clinic today with her daughter. She reports to be doing good. Her daughter reports to not remembering Dr. Kathlene Cote lately. They have not seen him since November of 2017. She denies pain or constipation. She does have a gastroenterologist and it has been a year since she seen him. They were to return every 6 months. She goes to see Dr. Ronnald Ramp in September. Overall she feels well.    MEDICAL HISTORY:  Past Medical History:  Diagnosis Date  . ALCOHOL ABUSE 07/13/2010  . ANXIETY 12/22/2009  . DEPRESSION 12/22/2009  . 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 DJD (degenerative joint disease), cervical    patient denies on preop of 05/10/15   . FATIGUE 12/23/2010  . GALLSTONES 12/22/2009  . Gallstones   . Headache    occasional   . HYPERLIPIDEMIA 12/22/2009  . HYPERTENSION 12/22/2009  . HYPONATREMIA 12/22/2009  . 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 OSTEOPENIA 12/22/2009  . Pancytopenia 07/13/2010  . RENAL CYST, LEFT 12/22/2009  . VITAMIN D DEFICIENCY 12/23/2010  . WEIGHT LOSS 12/23/2010    SURGICAL HISTORY: Past Surgical History:  Procedure Laterality Date  . ABDOMINAL HYSTERECTOMY    . IR GENERIC HISTORICAL  10/18/2016   IR RADIOLOGIST EVAL & MGMT 10/18/2016 [name], MD [facility] INTERV RAD    SOCIAL HISTORY: History   Social History  . Marital Status: Widowed    Spouse Name: N/A  . Number of Children: 5, she lives with one of her son   . Years of Education: N/A   Occupational History  . Retired    Social History Main Topics  . Smoking status: Never Smoker   . Smokeless tobacco: Not on file  . Alcohol Use: Yes  . Drug Use: No  . Sexual Activity: Not on file   Other Topics Concern  . Not on file   Social History Narrative    FAMILY HISTORY: Family History  Problem Relation Age of Onset  . Lung  cancer Brother   . Breast cancer Other   . Breast cancer Sister   . Gastric cancer Sister   . Lung cancer Sister   . Colon cancer Neg Hx   . Colon polyps Neg Hx   . Rectal cancer Neg Hx   . Stomach cancer Neg Hx   . Esophageal cancer Neg Hx     ALLERGIES:  has No Known Allergies.  MEDICATIONS:  Current Outpatient Prescriptions  Medication Sig Dispense Refill  . ALPRAZolam (XANAX) 0.5 MG tablet TAKE ONE TABLET BY MOUTH AT BEDTIME AS NEEDED 90 tablet 1  . amLODipine (NORVASC) 10 MG tablet Take 1 tablet (10 mg total) by mouth daily. Yearly physical w/labs due in June must see MD for refills 90 tablet 0  . Cholecalciferol (VITAMIN D3) 1000 UNITS CAPS Take 1 capsule by mouth every morning.     . citalopram (CELEXA) 20 MG tablet TAKE ONE-HALF TABLET BY MOUTH ONCE  DAILY 90 tablet 1  . FLUOCINOLONE ACETONIDE SCALP 0.01 % OIL Reported on 06/18/6202    . folic acid (FOLVITE) 1 MG tablet Take 1 tablet (1 mg total) by mouth daily. 90 tablet 3  . hydrALAZINE (APRESOLINE) 100 MG tablet TAKE ONE TABLET BY MOUTH THREE TIMES DAILY 270 tablet 2  . ibuprofen (ADVIL,MOTRIN) 200 MG tablet Take 1-2 tablets (200-400 mg total) by mouth every 6 (six) hours as needed for fever, headache, mild pain or moderate pain. 30 tablet 0  . isosorbide dinitrate (ISORDIL) 10 MG tablet TAKE ONE TABLET BY MOUTH THREE TIMES DAILY WITH FOOD 270 tablet 2  . ketotifen (ZADITOR) 0.025 % ophthalmic solution Place 1 drop into both eyes 2 (two) times daily.    Marland Kitchen lactulose (CHRONULAC) 10 GM/15ML solution Take 30 mLs (20 g total) by mouth daily. 473 mL 3  . Multiple Vitamin (MULTIVITAMIN) tablet Take 1 tablet by mouth every morning.     Vladimir Faster Glycol-Propyl Glycol (SYSTANE OP) Place 1 drop into both eyes every morning.     . propranolol (INDERAL) 20 MG tablet TAKE ONE TABLET BY MOUTH TWICE DAILY 60 tablet 3   No current facility-administered medications for this visit.     REVIEW OF SYSTEMS:    Constitutional: Denies fevers,  chills or abnormal night sweats Eyes: Denies blurriness of vision, double vision or watery eyes Ears, nose, mouth, throat, and face: Denies mucositis or sore throat Respiratory: Denies cough, dyspnea or wheezes Cardiovascular: Denies palpitation, chest discomfort or lower extremity swelling Gastrointestinal:  Denies nausea, heartburn or change in bowel habits Skin: Denies abnormal skin rashes Lymphatics: Denies new lymphadenopathy or easy bruising Neurological:Denies numbness, tingling or new weaknesses Behavioral/Psych: Mood is stable, no new changes  All other systems were reviewed with the patient and are negative.  PHYSICAL EXAMINATION: ECOG PERFORMANCE STATUS: 1 - Symptomatic but completely ambulatory  Vitals:   04/30/17 0953  BP: (!) 161/69  Pulse: 71  Resp: 18  Temp: 98.5 F (36.9 C)   Filed Weights   04/30/17 0953  Weight: 129 lb 9.6 oz (58.8 kg)    GENERAL:alert, no distress and comfortable SKIN: skin color, texture, turgor are normal, no rashes or significant lesions EYES: normal, conjunctiva are pink and non-injected, sclera clear OROPHARYNX:no exudate, no erythema and lips, buccal mucosa, and tongue normal  NECK: supple, thyroid normal size, non-tender, without nodularity LYMPH:  no palpable lymphadenopathy in the cervical, axillary or inguinal LUNGS: clear to auscultation and percussion with normal breathing effort HEART: regular rate & rhythm and no murmurs and no lower extremity edema ABDOMEN:abdomen soft, non-tender and normal bowel sounds Musculoskeletal:no cyanosis of digits and no clubbing  PSYCH: alert & oriented x 3 with fluent speech NEURO: no focal motor/sensory deficits  LABORATORY DATA:  I have reviewed the data as listed CBC Latest Ref Rng & Units 04/30/2017 02/28/2017 05/16/2016  WBC 3.9 - 10.3 10e3/uL 4.5 4.7 4.0  Hemoglobin 11.6 - 15.9 g/dL 12.4 12.4 14.5  Hematocrit 34.8 - 46.6 % 37.1 36.5 43.5  Platelets 145 - 400 10e3/uL 157 152.0 148    CMP Latest Ref Rng & Units 04/30/2017 02/28/2017 02/28/2017  Glucose 70 - 140 mg/dl 85 - 125(H)  BUN 7.0 - 26.0 mg/dL 22.5 - 23  Creatinine 0.6 - 1.1 mg/dL 1.9(H) - 1.72(H)  Sodium 136 - 145 mEq/L 132(L) - 129(L)  Potassium 3.5 - 5.1 mEq/L 4.7 - 4.2  Chloride 96 - 112 mEq/L - - 96  CO2 22 - 29 mEq/L 22 -  22  Calcium 8.4 - 10.4 mg/dL 9.2 9.8 10.0  Total Protein 6.4 - 8.3 g/dL 7.4 - 8.0  Total Bilirubin 0.20 - 1.20 mg/dL 0.35 - 0.3  Alkaline Phos 40 - 150 U/L 65 - 50  AST 5 - 34 U/L 29 - 21  ALT 0 - 55 U/L 18 - 12   PROCEDURES:   Upper Endoscopy: 12/24/15 ENDOSCOPIC IMPRESSION: There was mild to moderate erosive gastritis (mid and distal stomach). This was not classic appearing for portal gastropathy. Biopsies were taken from antrum and body to check for H. pylori, portal gastropathy, GAVE. The examination was otherwise normal. There were no gastric or esophageal varices  Colonoscopy: 12/24/15 ENDOSCOPIC IMPRESSION:  1. Mild diverticulosis was noted in the left colon 2. The examination was otherwise normal   RADIOGRAPHIC STUDIES: I have personally reviewed the radiological images as listed and agreed with the findings in the report.  MM SCREENING BREAST TOMO BILATERAL 04/18/17 IMPRESSION: No mammographic evidence of malignancy. A result letter of this screening mammogram will be mailed directly to the patient.  MRI ABD 09/06/16 IMPRESSION: 1. Minimal further size reduction at the ablation site, no recurrence or new focus of hepatocellular carcinoma identified. 2. Complex cystic lesion of the left kidney upper pole with some thin internal septations but no measurable enhancement, has minimally increased in size over the past 9 years. High likelihood that this is a benign lesion, categorized as Bosniak category IIF in the past. I would suggest ancillary surveillance at the time of liver follow up. 3. Mild chronic gallbladder wall thickening, with a stone lodged in the neck of  the gallbladder. Mild chronic gallbladder distention.   ASSESSMENT & PLAN:  71 y.o.  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  1. Stage I hepatocellular carcinoma  -I previously reviewed her liver MRI scan images with her and her family members in great detail. The 1.5 cm liver lesion in right lobe has typical arterial enhancement and venous washout, is consistent with HCC, in the background of liver cirrhosis. The other 1.0 centimeter lesion does not have typical arterial enhancement, and is indeterminate.  -Her liver MRI was diagnostic for [facility], she did not need liver biopsy  -She is now status post CT-guided microwave ablation by Dr. Kathlene Cote in May 2016, and had a good response. -Her last restaging liver MRI in 08/2016 showed treatment effect, no recurrence or new lesions. -Lab reviewed, her liver function is normal, normal CBC.  -I encouraged her to continue surveillance with annual scans and check ups. She is over due for scan this year so will need to order scan in the next 2-3 weeks, due to her compromised kidney function, we may not be able to cure her contrast for MRI. -I encouraged her to follow-up with Dr. Kathlene Cote, I sent a message to him. -Labs reviewed and Cr. On 04/30/17 was 1.9 and was 1.7 2 months ago. Her AFP from today pending 04/30/17 -I'll see her back in 6 months, or one year if she sees Dr. Kathlene Cote in the interim.  2. 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 Ardis Hughs, last visit was March 2017, I encourage patient to call for appointment.  3. CKD -Cr. On 04/30/17 was 1.9 and was 1.7 2 months ago. Her Kidney function is compromised but she is still fine. She does not have a nephrologist to follow up with,  we will continue to monitor. Will order MRI scan to be done in the next 2-3 weeks.   -She takes Ibuprofen for pain and we discussed to avoid NSADs due to her CKD     4. Hypertension, poorly controlled -Her blood pressure was 229/103 in my office previously, asymptomatic. She did not take her blood pressure medication this morning.  -She will continue medication -I strongly encouraged her to monitor her blood pressure at home and follow-up with her primary care physician  PLAN: -I'll see her back in 6 month with lab and another MRI one week before, or in one year if Dr. Kathlene Cote see her in about 6 months  -ABD MRI in 2-3 weeks, no contrast due to her CKD EGFR 30 -I sent a message to Dr. Kathlene Cote, will see her after the next scan     Orders Placed This Encounter  Procedures  . MR Abdomen W Wo Contrast    Standing Status:   Future    Standing Expiration Date:   06/30/2018    Scheduling Instructions:     OK to hold contrast if EGFR<=30    Order Specific Question:   If indicated for the ordered procedure, I authorize the administration of contrast media per Radiology protocol    Answer:   Yes    Order Specific Question:   Reason for Exam (SYMPTOM  OR DIAGNOSIS REQUIRED)    Answer:   follow up [facility]    Order Specific Question:   What is the patient's sedation requirement?    Answer:   No Sedation    Order Specific Question:   Does the patient have a pacemaker or implanted devices?    Answer:   No    Order Specific Question:   Preferred imaging location?    Answer:   [facility] (table limit-350 lbs)    Order Specific Question:   Radiology Contrast Protocol - do NOT remove file path    Answer:   \\[email or web address]  . MR Abdomen Wo Contrast    Standing Status:   Future    Standing Expiration Date:   04/30/2018    Order Specific Question:   Reason for Exam (SYMPTOM  OR DIAGNOSIS REQUIRED)    Answer:   follow up [facility]    Order Specific Question:   What is the patient's sedation requirement?    Answer:   No Sedation    Order Specific  Question:   Does the patient have a pacemaker or implanted devices?    Answer:   No    Order Specific Question:   Preferred imaging location?    Answer:   [facility] (table limit-350 lbs)    Order Specific Question:   Radiology Contrast Protocol - do NOT remove file path    Answer:   \\[email or web address]    All questions were answered. The patient knows to call the clinic with any problems, questions or concerns. I spent 25 minutes counseling the patient face to face. The total time spent in the appointment was 30 minutes and more than 50% was on counseling.     Truitt Merle, MD 04/30/2017

## 2017-04-30 ENCOUNTER — Encounter: Payer: Self-pay | Admitting: Hematology

## 2017-04-30 ENCOUNTER — Other Ambulatory Visit (HOSPITAL_BASED_OUTPATIENT_CLINIC_OR_DEPARTMENT_OTHER): Payer: Medicare Other

## 2017-04-30 ENCOUNTER — Ambulatory Visit (HOSPITAL_BASED_OUTPATIENT_CLINIC_OR_DEPARTMENT_OTHER): Payer: Medicare Other | Admitting: Hematology

## 2017-04-30 ENCOUNTER — Telehealth: Payer: Self-pay | Admitting: Hematology

## 2017-04-30 VITALS — BP 161/69 | HR 71 | Temp 98.5°F | Resp 18 | Ht 63.0 in | Wt 129.6 lb

## 2017-04-30 DIAGNOSIS — C220 Liver cell carcinoma: Secondary | ICD-10-CM | POA: Diagnosis not present

## 2017-04-30 LAB — CBC WITH DIFFERENTIAL/PLATELET
BASO%: 1.1 % (ref 0.0–2.0)
Basophils Absolute: 0.1 10*3/uL (ref 0.0–0.1)
EOS%: 4.3 % (ref 0.0–7.0)
Eosinophils Absolute: 0.2 10*3/uL (ref 0.0–0.5)
HEMATOCRIT: 37.1 % (ref 34.8–46.6)
HGB: 12.4 g/dL (ref 11.6–15.9)
LYMPH#: 0.8 10*3/uL — AB (ref 0.9–3.3)
LYMPH%: 17.7 % (ref 14.0–49.7)
MCH: 29.6 pg (ref 25.1–34.0)
MCHC: 33.5 g/dL (ref 31.5–36.0)
MCV: 88.4 fL (ref 79.5–101.0)
MONO#: 0.6 10*3/uL (ref 0.1–0.9)
MONO%: 13.9 % (ref 0.0–14.0)
NEUT%: 63 % (ref 38.4–76.8)
NEUTROS ABS: 2.8 10*3/uL (ref 1.5–6.5)
PLATELETS: 157 10*3/uL (ref 145–400)
RBC: 4.2 10*6/uL (ref 3.70–5.45)
RDW: 13.6 % (ref 11.2–14.5)
WBC: 4.5 10*3/uL (ref 3.9–10.3)

## 2017-04-30 LAB — COMPREHENSIVE METABOLIC PANEL
ALT: 18 U/L (ref 0–55)
ANION GAP: 8 meq/L (ref 3–11)
AST: 29 U/L (ref 5–34)
Albumin: 2.6 g/dL — ABNORMAL LOW (ref 3.5–5.0)
Alkaline Phosphatase: 65 U/L (ref 40–150)
BILIRUBIN TOTAL: 0.35 mg/dL (ref 0.20–1.20)
BUN: 22.5 mg/dL (ref 7.0–26.0)
CO2: 22 meq/L (ref 22–29)
CREATININE: 1.9 mg/dL — AB (ref 0.6–1.1)
Calcium: 9.2 mg/dL (ref 8.4–10.4)
Chloride: 101 mEq/L (ref 98–109)
EGFR: 30 mL/min/{1.73_m2} — ABNORMAL LOW (ref >90)
Glucose: 85 mg/dl (ref 70–140)
Potassium: 4.7 mEq/L (ref 3.5–5.1)
Sodium: 132 mEq/L — ABNORMAL LOW (ref 136–145)
TOTAL PROTEIN: 7.4 g/dL (ref 6.4–8.3)

## 2017-04-30 NOTE — Telephone Encounter (Signed)
Gave patient AVS and calender per 5/14 - [facility] to contact patient with MRI schedule.

## 2017-05-01 LAB — AFP TUMOR MARKER: AFP, SERUM, TUMOR MARKER: 8.6 ng/mL — AB (ref 0.0–8.3)

## 2017-05-02 ENCOUNTER — Other Ambulatory Visit: Payer: Self-pay | Admitting: Internal Medicine

## 2017-05-15 ENCOUNTER — Ambulatory Visit (HOSPITAL_COMMUNITY)
Admission: RE | Admit: 2017-05-15 | Discharge: 2017-05-15 | Disposition: A | Discharge status: Home / Self Care | Payer: Medicare Other | Source: Ambulatory Visit | Attending: Hematology | Admitting: Hematology

## 2017-05-15 DIAGNOSIS — C220 Liver cell carcinoma: Secondary | ICD-10-CM | POA: Insufficient documentation

## 2017-05-15 DIAGNOSIS — N281 Cyst of kidney, acquired: Secondary | ICD-10-CM | POA: Diagnosis not present

## 2017-05-15 DIAGNOSIS — Z9889 Other specified postprocedural states: Secondary | ICD-10-CM | POA: Diagnosis not present

## 2017-05-21 ENCOUNTER — Telehealth: Payer: Self-pay | Admitting: *Deleted

## 2017-05-21 NOTE — Telephone Encounter (Signed)
Called & spoke with [place] MRI result reviewed & requested she call DR Margaretmary Dys office for f/u in a few mo.  She expressed understanding.

## 2017-05-21 NOTE — Telephone Encounter (Signed)
-----   Message from Truitt Merle, MD sent at 05/20/2017 12:18 PM EDT ----- Please call pt and let her know the MRI result. Please ask her to call Dr. Margaretmary Dys office for follow up appointment in a few months, I don't see anything scheduled in Epic.   Thanks  Krista Blue

## 2017-05-22 ENCOUNTER — Other Ambulatory Visit: Payer: Self-pay | Admitting: Interventional Radiology

## 2017-05-22 DIAGNOSIS — C220 Liver cell carcinoma: Secondary | ICD-10-CM

## 2017-05-24 ENCOUNTER — Other Ambulatory Visit: Payer: Self-pay | Admitting: Internal Medicine

## 2017-05-24 NOTE — Telephone Encounter (Signed)
Done hardcopy to [place]  

## 2017-05-25 NOTE — Telephone Encounter (Signed)
Faxed

## 2017-05-30 ENCOUNTER — Ambulatory Visit
Admission: RE | Admit: 2017-05-30 | Discharge: 2017-05-30 | Disposition: A | Discharge status: Home / Self Care | Payer: Medicare Other | Source: Ambulatory Visit | Attending: Interventional Radiology | Admitting: Interventional Radiology

## 2017-05-30 DIAGNOSIS — C220 Liver cell carcinoma: Secondary | ICD-10-CM

## 2017-05-30 HISTORY — PX: IR RADIOLOGIST EVAL & MGMT: IMG5224

## 2017-05-30 NOTE — Progress Notes (Signed)
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   History of Present Illness: Sarah Harmon is a 71 y.o. female now 2 years status post thermal ablation of a 1.8 cm peripheral segment 8 hepatocellular carcinoma. She has been doing well clinically with no complaints. AFP level has never been very elevated and has been as high as 10.6 post ablation. The latest AFP level is 8.6 on 04/30/2017. She has had some progressive renal insufficiency by laboratory testing with latest creatinine of 1.9 and GFR of less than 30 mL per minute. This does represent worsening of renal function over the last year with previous baseline of approximately a creatinine of 1.3-1.4 and GFR closer to 50 mL per minute. The recent follow-up MRI was performed without gadolinium due to the worsening kidney function. She recently saw Dr. Burr Medico on 04/30/2017. She is getting ready to go on a cruise with her daughter later this month.  Past Medical History:  Diagnosis Date  . ALCOHOL ABUSE 07/13/2010  . ANXIETY 12/22/2009  . DEPRESSION 12/22/2009  . 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 DJD (degenerative joint disease), cervical    patient denies on preop of 05/10/15   . FATIGUE 12/23/2010  . GALLSTONES 12/22/2009  . Gallstones   . Headache    occasional   . HYPERLIPIDEMIA 12/22/2009  . HYPERTENSION 12/22/2009  . HYPONATREMIA 12/22/2009  . 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 OSTEOPENIA 12/22/2009  . Pancytopenia 07/13/2010  . RENAL CYST, LEFT 12/22/2009  . VITAMIN D DEFICIENCY 12/23/2010  . WEIGHT LOSS 12/23/2010    Past Surgical History:  Procedure Laterality Date  . ABDOMINAL HYSTERECTOMY    . IR GENERIC HISTORICAL  10/18/2016   IR RADIOLOGIST EVAL & MGMT 10/18/2016 [name], MD [facility] INTERV RAD    Allergies: Patient has no known allergies.  Medications: Prior to Admission medications   Medication Sig Start Date End  Date Taking? Authorizing Provider  ALPRAZolam Duanne Moron) 0.5 MG tablet TAKE ONE TABLET BY MOUTH AT BEDTIME 05/24/17   Biagio Borg, MD  amLODipine (NORVASC) 10 MG tablet Take 1 tablet (10 mg total) by mouth daily. 05/02/17   Biagio Borg, MD  Cholecalciferol (VITAMIN D3) 1000 UNITS CAPS Take 1 capsule by mouth every morning.     [provider]  citalopram (CELEXA) 20 MG tablet TAKE ONE-HALF TABLET BY MOUTH ONCE DAILY 12/26/16   Biagio Borg, MD  FLUOCINOLONE ACETONIDE SCALP 0.01 % OIL Reported on 01/25/2016 11/21/15   [provider]  folic acid (FOLVITE) 1 MG tablet Take 1 tablet (1 mg total) by mouth daily. 06/01/16   Biagio Borg, MD  hydrALAZINE (APRESOLINE) 100 MG tablet TAKE ONE TABLET BY MOUTH THREE TIMES DAILY 05/17/16   Biagio Borg, MD  ibuprofen (ADVIL,MOTRIN) 200 MG tablet Take 1-2 tablets (200-400 mg total) by mouth every 6 (six) hours as needed for fever, headache, mild pain or moderate pain. 03/07/15   Cherene Altes, MD  isosorbide dinitrate (ISORDIL) 10 MG tablet TAKE ONE TABLET BY MOUTH THREE TIMES DAILY WITH FOOD 05/17/16   Biagio Borg, MD  ketotifen (ZADITOR) 0.025 % ophthalmic solution Place 1 drop into both eyes 2 (two) times daily.    [provider]  lactulose (CHRONULAC) 10 GM/15ML solution Take 30 mLs (20 g total) by mouth daily. 02/28/17   Biagio Borg, MD  Multiple Vitamin (MULTIVITAMIN) tablet Take 1 tablet  by mouth every morning.     [provider]  Polyethyl Glycol-Propyl Glycol (SYSTANE OP) Place 1 drop into both eyes every morning.     [provider]  propranolol (INDERAL) 20 MG tablet TAKE ONE TABLET BY MOUTH TWICE DAILY 05/02/17   Biagio Borg, MD     Family History  Problem Relation Age of Onset  . Lung cancer Brother   . Breast cancer Other   . Breast cancer Sister   . Gastric cancer Sister   . Lung cancer Sister   . Colon cancer Neg Hx   . Colon polyps Neg Hx   . Rectal cancer Neg Hx   . Stomach cancer Neg Hx    . Esophageal cancer Neg Hx     Social History   Social History  . Marital status: Widowed    Spouse name: N/A  . Number of children: 4  . Years of education: N/A   Occupational History  . retired    Social History Main Topics  . Smoking status: Never Smoker  . Smokeless tobacco: Never Used  . Alcohol use 1.8 - 2.4 oz/week    3 - 4 Cans of beer per week     Comment: she quit 02/2015   . Drug use: No  . Sexual activity: Not on file   Other Topics Concern  . Not on file   Social History Narrative   Widowed, grown son lives with her   Retired from Manufacturing engineer   Has total of #5 children   Enjoys watching TV and planting flowers    ECOG Status: 0 - Asymptomatic  Review of Systems: A 12 point ROS discussed and pertinent positives are indicated in the HPI above.  All other systems are negative.  Review of Systems  Constitutional: Negative.   Respiratory: Negative.   Cardiovascular: Negative.   Gastrointestinal: Negative.   Genitourinary: Negative.   Musculoskeletal: Negative.   Neurological: Negative.     Vital Signs: BP (!) 194/84   Pulse (!) 59   Temp 98.2 F (36.8 C) (Oral)   Resp 16   Ht 5\' 4"  (1.626 m)   Wt 124 lb (56.2 kg)   SpO2 100%   BMI 21.28 kg/m   Physical Exam  Constitutional: She is oriented to person, place, and time. No distress.  Abdominal: Soft. She exhibits no distension. There is no tenderness. There is no rebound and no guarding.  Musculoskeletal: She exhibits no edema.  Neurological: She is alert and oriented to person, place, and time.  Skin: She is not diaphoretic.  Nursing note and vitals reviewed.   Imaging: Mr Abdomen Wo Contrast  Result Date: 05/15/2017 CLINICAL DATA:  Follow-up [place] status post thermal ablation EXAM: MRI ABDOMEN WITHOUT CONTRAST TECHNIQUE: Multiplanar multisequence MR imaging was performed without the administration of intravenous contrast. COMPARISON:  09/06/2016 FINDINGS: Lower chest: Lung  bases are clear. Hepatobiliary: Mildly nodular hepatic contour, reflecting cirrhosis. Subcapsular ablation zone in segment 8 (series 5/ image 42), poorly evaluated on unenhanced MRI, but possibly mildly decreased in size (now measuring 1.4 cm). Otherwise, no focal hepatic lesion is seen. Distended gallbladder, without cholelithiasis or associated inflammatory changes. Pancreas:  Within normal limits. Spleen:  Within normal limits. Adrenals/Urinary Tract:  Adrenal glands are within normal limits. 4.0 x 3.2 cm complex cystic/septated lesion along the medial left upper kidney (series 10/ image 28), incompletely characterized in the absence of intravenous contrast, but grossly unchanged. Right kidney is within normal limits.  No  hydronephrosis. Stomach/Bowel: Stomach and visualized bowel are unremarkable. Vascular/Lymphatic:  No evidence of abdominal aortic aneurysm. No suspicious abdominal lymphadenopathy. Other:  No abdominal ascites. Musculoskeletal: No focal osseous lesions. IMPRESSION: Ablation zone in segment 8 is favored to have mildly decreased in size, now measuring 1.4 cm. This is incompletely evaluated in the absence of intravenous contrast. Otherwise, no focal hepatic lesion is seen. Suspected mild cirrhosis. 4.0 x 3.2 cm complex/septated left upper pole renal cyst, incompletely evaluated, but grossly unchanged. Electronically Signed   By: Julian Hy M.D.   On: 05/15/2017 08:57    Labs:  CBC:  Recent Labs  02/28/17 0944 04/30/17 0929  WBC 4.7 4.5  HGB 12.4 12.4  HCT 36.5 37.1  PLT 152.0 157    COAGS: No results for input(s): INR, APTT in the last 8760 hours.  BMP:  Recent Labs  09/02/16 0838 02/28/17 0944 04/30/17 0929  NA 133* 129* 132*  K 4.4 4.2 4.7  CL 101 96  --   CO2 23 22 22   GLUCOSE 97 125* 85  BUN 20 23 22.5  CALCIUM 9.0 9.8  10.0 9.2  CREATININE 1.64* 1.72* 1.9*  GFRNONAA 31*  --   --   GFRAA 36*  --   --     LIVER FUNCTION TESTS:  Recent Labs   09/02/16 0838 02/28/17 0944 04/30/17 0929  BILITOT 0.4 0.3 0.35  AST 27 21 29   ALT 16 12 18   ALKPHOS 46 50 65  PROT 7.6 8.0 7.4  ALBUMIN 3.4* 3.6 2.6*    TUMOR MARKERS:  Recent Labs  09/02/16 0838  AFPTM 10.6*    Assessment and Plan:  I reviewed the recent MRI of the abdomen dated 05/15/2017 with Sarah Harmon and her daughter. This shows a smaller overall ablation defect within the peripheral liver at the level of the ablated carcinoma. No new hepatic lesions are identified without contrast.  I recommended continued surveillance of the liver by MRI. In the future, it would help to administer a half dose of gadolinium if renal function were to improve. If the trend in worsening renal function continues, it may help to obtain a Nephrology consultation.  The patient states that she is currently scheduled to follow-up with Dr. Jenny Reichmann in September. A repeat of renal function at that time would be helpful. I recommended another MRI of the abdomen in the next 9-12 months to follow the ablation site.  Electronically SignedAletta Edouard T 05/30/2017, 3:31 PM   I spent a total of 15 Minutes in face to face in clinical consultation, greater than 50% of which was counseling/coordinating care post ablation of a hepatocellular carcinoma.

## 2017-06-04 ENCOUNTER — Encounter: Payer: Self-pay | Admitting: Interventional Radiology

## 2017-06-04 ENCOUNTER — Other Ambulatory Visit: Payer: Self-pay | Admitting: Internal Medicine

## 2017-06-28 ENCOUNTER — Other Ambulatory Visit: Payer: Self-pay | Admitting: Internal Medicine

## 2017-09-04 ENCOUNTER — Encounter: Payer: Self-pay | Admitting: Internal Medicine

## 2017-09-04 ENCOUNTER — Other Ambulatory Visit (INDEPENDENT_AMBULATORY_CARE_PROVIDER_SITE_OTHER): Payer: Medicare Other

## 2017-09-04 ENCOUNTER — Ambulatory Visit (INDEPENDENT_AMBULATORY_CARE_PROVIDER_SITE_OTHER): Payer: Medicare Other | Admitting: Internal Medicine

## 2017-09-04 ENCOUNTER — Telehealth: Payer: Self-pay

## 2017-09-04 DIAGNOSIS — I10 Essential (primary) hypertension: Secondary | ICD-10-CM

## 2017-09-04 DIAGNOSIS — N183 Chronic kidney disease, stage 3 unspecified: Secondary | ICD-10-CM

## 2017-09-04 DIAGNOSIS — N184 Chronic kidney disease, stage 4 (severe): Secondary | ICD-10-CM

## 2017-09-04 DIAGNOSIS — E871 Hypo-osmolality and hyponatremia: Secondary | ICD-10-CM

## 2017-09-04 DIAGNOSIS — E785 Hyperlipidemia, unspecified: Secondary | ICD-10-CM

## 2017-09-04 LAB — CBC WITH DIFFERENTIAL/PLATELET
BASOS ABS: 0 10*3/uL (ref 0.0–0.1)
Basophils Relative: 1 % (ref 0.0–3.0)
Eosinophils Absolute: 0.2 10*3/uL (ref 0.0–0.7)
Eosinophils Relative: 5.8 % — ABNORMAL HIGH (ref 0.0–5.0)
HCT: 36.9 % (ref 36.0–46.0)
Hemoglobin: 12.3 g/dL (ref 12.0–15.0)
LYMPHS PCT: 21.4 % (ref 12.0–46.0)
Lymphs Abs: 0.8 10*3/uL (ref 0.7–4.0)
MCHC: 33.3 g/dL (ref 30.0–36.0)
MCV: 89.2 fl (ref 78.0–100.0)
MONOS PCT: 11.7 % (ref 3.0–12.0)
Monocytes Absolute: 0.5 10*3/uL (ref 0.1–1.0)
NEUTROS PCT: 60.1 % (ref 43.0–77.0)
Neutro Abs: 2.4 10*3/uL (ref 1.4–7.7)
Platelets: 177 10*3/uL (ref 150.0–400.0)
RBC: 4.14 Mil/uL (ref 3.87–5.11)
RDW: 14.2 % (ref 11.5–15.5)
WBC: 3.9 10*3/uL — ABNORMAL LOW (ref 4.0–10.5)

## 2017-09-04 LAB — URINALYSIS, ROUTINE W REFLEX MICROSCOPIC
BILIRUBIN URINE: NEGATIVE
Hgb urine dipstick: NEGATIVE
Ketones, ur: NEGATIVE
LEUKOCYTES UA: NEGATIVE
Nitrite: NEGATIVE
PH: 6.5 (ref 5.0–8.0)
Specific Gravity, Urine: 1.015 (ref 1.000–1.030)
UROBILINOGEN UA: 0.2 (ref 0.0–1.0)
Urine Glucose: NEGATIVE

## 2017-09-04 LAB — LIPID PANEL
Cholesterol: 243 mg/dL — ABNORMAL HIGH (ref 0–200)
HDL: 128.3 mg/dL (ref >39.00)
NonHDL: 114.88
TRIGLYCERIDES: 233 mg/dL — AB (ref 0.0–149.0)
Total CHOL/HDL Ratio: 2
VLDL: 46.6 mg/dL — ABNORMAL HIGH (ref 0.0–40.0)

## 2017-09-04 LAB — BASIC METABOLIC PANEL
BUN: 23 mg/dL (ref 6–23)
CALCIUM: 9.3 mg/dL (ref 8.4–10.5)
CO2: 23 meq/L (ref 19–32)
CREATININE: 2.28 mg/dL — AB (ref 0.40–1.20)
Chloride: 95 mEq/L — ABNORMAL LOW (ref 96–112)
GFR: 27.01 mL/min — ABNORMAL LOW (ref >60.00)
GLUCOSE: 121 mg/dL — AB (ref 70–99)
Potassium: 4.2 mEq/L (ref 3.5–5.1)
Sodium: 126 mEq/L — ABNORMAL LOW (ref 135–145)

## 2017-09-04 LAB — HEPATIC FUNCTION PANEL
ALBUMIN: 3.1 g/dL — AB (ref 3.5–5.2)
ALT: 11 U/L (ref 0–35)
AST: 20 U/L (ref 0–37)
Alkaline Phosphatase: 70 U/L (ref 39–117)
Bilirubin, Direct: 0.1 mg/dL (ref 0.0–0.3)
TOTAL PROTEIN: 7.6 g/dL (ref 6.0–8.3)
Total Bilirubin: 0.3 mg/dL (ref 0.2–1.2)

## 2017-09-04 LAB — LDL CHOLESTEROL, DIRECT: Direct LDL: 85 mg/dL

## 2017-09-04 LAB — TSH: TSH: 1.58 u[IU]/mL (ref 0.35–4.50)

## 2017-09-04 MED ORDER — PROPRANOLOL HCL 40 MG PO TABS: 40.0000 mg | ORAL_TABLET | Freq: Two times a day (BID) | ORAL | 3 refills | Status: DC

## 2017-09-04 NOTE — Assessment & Plan Note (Signed)
Chronic mild stable, cont to follow

## 2017-09-04 NOTE — Progress Notes (Signed)
Subjective:    Patient ID: Sarah Harmon, female    DOB: 12-Nov-1946, 71 y.o.   MRN: [id number]  HPI  Here to f/u with grandduaghter in law present for support; pt daughter oversees her care, so not truly clear if taking all of her medications, though pt states she is.  She is overall doing ok,  Pt denies chest pain, increasing sob or doe, wheezing, orthopnea, PND, increased LE swelling, palpitations, dizziness or syncope.  Pt denies new neurological symptoms such as new headache, or facial or extremity weakness or numbness.  Pt denies polydipsia, polyuria, or low sugar episode.  Pt states overall good compliance with meds, mostly trying to follow appropriate diet, with wt overall stable,  but little exercise however. BP Readings from Last 3 Encounters:  09/04/17 (!) 220/102  05/30/17 (!) 194/84  04/30/17 (!) 161/69   Past Medical History:  Diagnosis Date  . ALCOHOL ABUSE 07/13/2010  . ANXIETY 12/22/2009  . DEPRESSION 12/22/2009  . 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 DJD (degenerative joint disease), cervical    patient denies on preop of 05/10/15   . FATIGUE 12/23/2010  . GALLSTONES 12/22/2009  . Gallstones   . Headache    occasional   . HYPERLIPIDEMIA 12/22/2009  . HYPERTENSION 12/22/2009  . HYPONATREMIA 12/22/2009  . 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 OSTEOPENIA 12/22/2009  . Pancytopenia 07/13/2010  . RENAL CYST, LEFT 12/22/2009  . VITAMIN D DEFICIENCY 12/23/2010  . WEIGHT LOSS 12/23/2010   Past Surgical History:  Procedure Laterality Date  . ABDOMINAL HYSTERECTOMY    . IR GENERIC HISTORICAL  10/18/2016   IR RADIOLOGIST EVAL & MGMT 10/18/2016 [name], MD [facility] INTERV RAD  . IR RADIOLOGIST EVAL & MGMT  05/30/2017    reports that she has never smoked. She has never used smokeless tobacco. She reports that she drinks about 1.8 - 2.4 oz of alcohol per week . She reports that she does not use drugs. family history includes Breast cancer in her other and sister; Gastric cancer in  her sister; Lung cancer in her brother and sister. No Known Allergies Current Outpatient Prescriptions on File Prior to Visit  Medication Sig Dispense Refill  . ALPRAZolam (XANAX) 0.5 MG tablet TAKE ONE TABLET BY MOUTH AT BEDTIME 90 tablet 1  . amLODipine (NORVASC) 10 MG tablet Take 1 tablet (10 mg total) by mouth daily. 90 tablet 2  . Cholecalciferol (VITAMIN D3) 1000 UNITS CAPS Take 1 capsule by mouth every morning.     . citalopram (CELEXA) 20 MG tablet TAKE ONE-HALF TABLET BY MOUTH ONCE DAILY 90 tablet 1  . FLUOCINOLONE ACETONIDE SCALP 0.01 % OIL Reported on 02/20/6293    . folic acid (FOLVITE) 1 MG tablet TAKE ONE TABLET BY MOUTH ONCE DAILY 90 tablet 0  . hydrALAZINE (APRESOLINE) 100 MG tablet TAKE ONE TABLET BY MOUTH THREE TIMES DAILY 270 tablet 2  . ibuprofen (ADVIL,MOTRIN) 200 MG tablet Take 1-2 tablets (200-400 mg total) by mouth every 6 (six) hours as needed for fever, headache, mild pain or moderate pain. 30 tablet 0  . isosorbide dinitrate (ISORDIL) 10 MG tablet TAKE ONE TABLET BY MOUTH THREE TIMES DAILY WITH FOOD 270 tablet 2  . ketotifen (ZADITOR) 0.025 % ophthalmic solution Place 1 drop into both eyes 2 (two) times daily.    Marland Kitchen lactulose (CHRONULAC) 10 GM/15ML solution Take 30 mLs (20 g total) by mouth daily. 473 mL 3  . Multiple Vitamin (MULTIVITAMIN) tablet Take 1 tablet by mouth  every morning.     Vladimir Faster Glycol-Propyl Glycol (SYSTANE OP) Place 1 drop into both eyes every morning.     . propranolol (INDERAL) 20 MG tablet TAKE ONE TABLET BY MOUTH TWICE DAILY 60 tablet 3   No current facility-administered medications on file prior to visit.    Review of Systems  Constitutional: Negative for other unusual diaphoresis or sweats HENT: Negative for ear discharge or swelling Eyes: Negative for other worsening visual disturbances Respiratory: Negative for stridor or other swelling  Gastrointestinal: Negative for worsening distension or other blood Genitourinary: Negative for  retention or other urinary change Musculoskeletal: Negative for other MSK pain or swelling Skin: Negative for color change or other new lesions Neurological: Negative for worsening tremors and other numbness  Psychiatric/Behavioral: Negative for worsening agitation or other fatigue All other system neg per pt    Objective:   Physical Exam BP (!) 220/102   Pulse 92   Temp 98 F (36.7 C) (Oral)   Ht 5\' 4"  (1.626 m)   Wt 129 lb (58.5 kg)   SpO2 100%   BMI 22.14 kg/m  VS noted,  Constitutional: Pt appears in NAD HENT: Head: NCAT.  Right Ear: External ear normal.  Left Ear: External ear normal.  Eyes: . Pupils are equal, round, and reactive to light. Conjunctivae and EOM are normal Nose: without d/c or deformity Neck: Neck supple. Gross normal ROM Cardiovascular: Normal rate and regular rhythm.   Pulmonary/Chest: Effort normal and breath sounds without rales or wheezing.  Abd:  Soft, NT, ND, + BS, no organomegaly Neurological: Pt is alert. At baseline orientation, motor grossly intact Skin: Skin is warm. No rashes, other new lesions, no LE edema Psychiatric: Pt behavior is normal without agitation , not depressed affect No other exam findings Lab Results  Component Value Date   WBC 4.5 04/30/2017   HGB 12.4 04/30/2017   HCT 37.1 04/30/2017   PLT 157 04/30/2017   GLUCOSE 85 04/30/2017   CHOL 198 02/28/2017   TRIG 142.0 02/28/2017   HDL 93.10 02/28/2017   LDLCALC 77 02/28/2017   ALT 18 04/30/2017   AST 29 04/30/2017   NA 132 (L) 04/30/2017   K 4.7 04/30/2017   CL 96 02/28/2017   CREATININE 1.9 (H) 04/30/2017   BUN 22.5 04/30/2017   CO2 22 04/30/2017   TSH 1.88 02/28/2017   INR 1.0 03/17/2016   HGBA1C 5.7 02/28/2017       Assessment & Plan:

## 2017-09-04 NOTE — Assessment & Plan Note (Signed)
Lab Results  Component Value Date   LDLCALC 77 02/28/2017  stable overall by history and exam, recent data reviewed with pt, and pt to continue medical treatment as before,  to f/u any worsening symptoms or concerns, for f/u lab today per pt reuqest

## 2017-09-04 NOTE — Telephone Encounter (Signed)
-----   Message from Biagio Borg, MD sent at 09/04/2017  1:02 PM EDT ----- Left message on MyChart, pt to cont same tx except  The test results show that your current treatment is OK, except the kidney function is even worse than a few months ago.  We should refer you as soon as possible to a kidney doctor.  You should also hear about this from the office  Melvie Paglia to please inform pt's daughter who handles her care, I will do referral

## 2017-09-04 NOTE — Telephone Encounter (Signed)
Pt's daughter has been informed and expressed understanding.  

## 2017-09-04 NOTE — Patient Instructions (Signed)
Ok to increase the propranolol to 40 mg twice per day  Please continue all other medications as before, and refills have been done if requested.  Please have the pharmacy call with any other refills you may need.  Please continue your efforts at being more active, low cholesterol diet, and weight control.  You are otherwise up to date with prevention measures today.  Please keep your appointments with your specialists as you may have planned  Please go to the LAB in the Basement (turn left off the elevator) for the tests to be done today  You will be contacted by phone if any changes need to be made immediately.  Otherwise, you will receive a letter about your results with an explanation, but please check with MyChart first.  Please remember to sign up for MyChart if you have not done so, as this will be important to you in the future with finding out test results, communicating by private email, and scheduling acute appointments online when needed.  Please return in 4 months, or sooner if needed

## 2017-09-04 NOTE — Assessment & Plan Note (Signed)
Possibly worse recently Lab Results  Component Value Date   CREATININE 1.9 (H) 04/30/2017  for f/u lab today, consider renal referral

## 2017-09-04 NOTE — Assessment & Plan Note (Signed)
Quite severe, asympt, I suspect some element of non compliance with meds but not clear, will increase the propranolol to 40 bid., cont all other meds

## 2017-09-09 ENCOUNTER — Encounter (HOSPITAL_COMMUNITY): Payer: Self-pay

## 2017-09-09 ENCOUNTER — Inpatient Hospital Stay (HOSPITAL_COMMUNITY)
Admission: EM | Admit: 2017-09-09 | Discharge: 2017-09-17 | DRG: 682 | Disposition: A | Discharge status: Home / Self Care | Payer: Medicare Other | Attending: Internal Medicine | Admitting: Internal Medicine

## 2017-09-09 DIAGNOSIS — R4701 Aphasia: Secondary | ICD-10-CM | POA: Diagnosis not present

## 2017-09-09 DIAGNOSIS — G9341 Metabolic encephalopathy: Secondary | ICD-10-CM | POA: Diagnosis present

## 2017-09-09 DIAGNOSIS — R7989 Other specified abnormal findings of blood chemistry: Secondary | ICD-10-CM | POA: Diagnosis not present

## 2017-09-09 DIAGNOSIS — E871 Hypo-osmolality and hyponatremia: Secondary | ICD-10-CM | POA: Diagnosis present

## 2017-09-09 DIAGNOSIS — R001 Bradycardia, unspecified: Secondary | ICD-10-CM | POA: Diagnosis not present

## 2017-09-09 DIAGNOSIS — Z79899 Other long term (current) drug therapy: Secondary | ICD-10-CM

## 2017-09-09 DIAGNOSIS — D509 Iron deficiency anemia, unspecified: Secondary | ICD-10-CM | POA: Diagnosis present

## 2017-09-09 DIAGNOSIS — I248 Other forms of acute ischemic heart disease: Secondary | ICD-10-CM | POA: Diagnosis present

## 2017-09-09 DIAGNOSIS — F411 Generalized anxiety disorder: Secondary | ICD-10-CM | POA: Diagnosis present

## 2017-09-09 DIAGNOSIS — N179 Acute kidney failure, unspecified: Secondary | ICD-10-CM | POA: Diagnosis not present

## 2017-09-09 DIAGNOSIS — J9601 Acute respiratory failure with hypoxia: Secondary | ICD-10-CM | POA: Diagnosis present

## 2017-09-09 DIAGNOSIS — E872 Acidosis: Secondary | ICD-10-CM | POA: Diagnosis present

## 2017-09-09 DIAGNOSIS — I161 Hypertensive emergency: Secondary | ICD-10-CM | POA: Diagnosis present

## 2017-09-09 DIAGNOSIS — R0602 Shortness of breath: Secondary | ICD-10-CM | POA: Diagnosis not present

## 2017-09-09 DIAGNOSIS — D649 Anemia, unspecified: Secondary | ICD-10-CM

## 2017-09-09 DIAGNOSIS — R918 Other nonspecific abnormal finding of lung field: Secondary | ICD-10-CM | POA: Diagnosis not present

## 2017-09-09 DIAGNOSIS — R4781 Slurred speech: Secondary | ICD-10-CM | POA: Diagnosis not present

## 2017-09-09 DIAGNOSIS — K7030 Alcoholic cirrhosis of liver without ascites: Secondary | ICD-10-CM | POA: Diagnosis present

## 2017-09-09 DIAGNOSIS — E875 Hyperkalemia: Secondary | ICD-10-CM | POA: Diagnosis present

## 2017-09-09 DIAGNOSIS — C220 Liver cell carcinoma: Secondary | ICD-10-CM | POA: Diagnosis present

## 2017-09-09 DIAGNOSIS — J9801 Acute bronchospasm: Secondary | ICD-10-CM

## 2017-09-09 DIAGNOSIS — F32A Depression, unspecified: Secondary | ICD-10-CM | POA: Diagnosis present

## 2017-09-09 DIAGNOSIS — Z8505 Personal history of malignant neoplasm of liver: Secondary | ICD-10-CM

## 2017-09-09 DIAGNOSIS — I129 Hypertensive chronic kidney disease with stage 1 through stage 4 chronic kidney disease, or unspecified chronic kidney disease: Secondary | ICD-10-CM | POA: Diagnosis present

## 2017-09-09 DIAGNOSIS — N183 Chronic kidney disease, stage 3 (moderate): Secondary | ICD-10-CM | POA: Diagnosis present

## 2017-09-09 DIAGNOSIS — E785 Hyperlipidemia, unspecified: Secondary | ICD-10-CM | POA: Diagnosis present

## 2017-09-09 DIAGNOSIS — R778 Other specified abnormalities of plasma proteins: Secondary | ICD-10-CM | POA: Diagnosis present

## 2017-09-09 DIAGNOSIS — F329 Major depressive disorder, single episode, unspecified: Secondary | ICD-10-CM | POA: Diagnosis present

## 2017-09-09 LAB — CBC WITH DIFFERENTIAL/PLATELET
BASOS PCT: 0 %
Basophils Absolute: 0 10*3/uL (ref 0.0–0.1)
EOS ABS: 0 10*3/uL (ref 0.0–0.7)
EOS PCT: 0 %
HCT: 31.5 % — ABNORMAL LOW (ref 36.0–46.0)
HEMOGLOBIN: 11.2 g/dL — AB (ref 12.0–15.0)
Lymphocytes Relative: 5 %
Lymphs Abs: 0.6 10*3/uL — ABNORMAL LOW (ref 0.7–4.0)
MCH: 29.3 pg (ref 26.0–34.0)
MCHC: 35.6 g/dL (ref 30.0–36.0)
MCV: 82.5 fL (ref 78.0–100.0)
Monocytes Absolute: 0.8 10*3/uL (ref 0.1–1.0)
Monocytes Relative: 7 %
NEUTROS PCT: 88 %
Neutro Abs: 10 10*3/uL — ABNORMAL HIGH (ref 1.7–7.7)
PLATELETS: 169 10*3/uL (ref 150–400)
RBC: 3.82 MIL/uL — AB (ref 3.87–5.11)
RDW: 13.9 % (ref 11.5–15.5)
WBC: 11.4 10*3/uL — AB (ref 4.0–10.5)

## 2017-09-09 LAB — I-STAT TROPONIN, ED: TROPONIN I, POC: 0.13 ng/mL — AB (ref 0.00–0.08)

## 2017-09-09 LAB — I-STAT CHEM 8, ED
BUN: 36 mg/dL — ABNORMAL HIGH (ref 6–20)
CALCIUM ION: 1.06 mmol/L — AB (ref 1.15–1.40)
Chloride: 85 mmol/L — ABNORMAL LOW (ref 101–111)
Creatinine, Ser: 3.3 mg/dL — ABNORMAL HIGH (ref 0.44–1.00)
Glucose, Bld: 151 mg/dL — ABNORMAL HIGH (ref 65–99)
HEMATOCRIT: 35 % — AB (ref 36.0–46.0)
Hemoglobin: 11.9 g/dL — ABNORMAL LOW (ref 12.0–15.0)
Potassium: 6.2 mmol/L — ABNORMAL HIGH (ref 3.5–5.1)
SODIUM: 112 mmol/L — AB (ref 135–145)
TCO2: 18 mmol/L — AB (ref 22–32)

## 2017-09-09 MED ORDER — ALBUTEROL SULFATE (2.5 MG/3ML) 0.083% IN NEBU
INHALATION_SOLUTION | RESPIRATORY_TRACT | Status: AC
  Filled 2017-09-09: qty 6

## 2017-09-09 MED ORDER — IPRATROPIUM-ALBUTEROL 0.5-2.5 (3) MG/3ML IN SOLN
3.0000 mL | Freq: Once | RESPIRATORY_TRACT | Status: AC
  Administered 2017-09-10: 3 mL via RESPIRATORY_TRACT
  Filled 2017-09-09: qty 3

## 2017-09-09 MED ORDER — ALBUTEROL SULFATE (2.5 MG/3ML) 0.083% IN NEBU
5.0000 mg | INHALATION_SOLUTION | Freq: Once | RESPIRATORY_TRACT | Status: AC
  Administered 2017-09-09: 5 mg via RESPIRATORY_TRACT

## 2017-09-09 NOTE — ED Triage Notes (Signed)
Pt comes today with slurred speech, last seen normal yesterday, no other neuro deficits noted. VAN negative. Pt also states that she feels SOB since yesterday. 93% on RA, wheezing in all fields

## 2017-09-09 NOTE — ED Provider Notes (Signed)
[facility] DEPT Provider Note   CSN: [id number] Arrival date & time: 09/09/17  2252     History   Chief Complaint Chief Complaint  Patient presents with  . Aphasia  . Shortness of Breath    HPI Sarah Harmon is a 71 y.o. female.  The history is provided by the patient and a relative. The history is limited by the condition of the patient (Altered mental status).  Shortness of Breath   Family called for ambulance because patient had complained of weakness and they had noted her speech was slurred. Last time it spoken with her was yesterday, and speech was normal. She had some diarrhea at home and had had some soiling of her clothes. She denies cough and denies chest pain. She is complaining of some pain in her mid back. There is been no nausea or vomiting. She's not had any fever or chills or sweats. She has history of alcoholic cirrhosis, but is currently abstinent. She also has history of hepatocellular carcinoma which has been treated and is not active.  Past Medical History:  Diagnosis Date  . ALCOHOL ABUSE 07/13/2010  . ANXIETY 12/22/2009  . DEPRESSION 12/22/2009  . 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 DJD (degenerative joint disease), cervical    patient denies on preop of 05/10/15   . FATIGUE 12/23/2010  . GALLSTONES 12/22/2009  . Gallstones   . Headache    occasional   . HYPERLIPIDEMIA 12/22/2009  . HYPERTENSION 12/22/2009  . HYPONATREMIA 12/22/2009  . 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 OSTEOPENIA 12/22/2009  . Pancytopenia 07/13/2010  . RENAL CYST, LEFT 12/22/2009  . VITAMIN D DEFICIENCY 12/23/2010  . WEIGHT LOSS 12/23/2010    Patient Active Problem List   Diagnosis Date Noted  . Cancer, hepatocellular ([place])   . Cirrhosis of liver ([place]) 03/04/2015  . Thrombocytopenia ([place]) 03/04/2015  . CKD (chronic kidney disease) stage 3, GFR 30-59 ml/min 03/04/2015  . Liver mass, right lobe   . Neoplasm /cancer ([place])   . Hepatocellular carcinoma ([place])   . Alcoholic  cirrhosis of liver without ascites ([place])   . Liver mass   . Left kidney mass   . 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 SIADH (syndrome of inappropriate ADH production) ([place])   . Hyponatremia 02/26/2015  . Hypertensive urgency 02/26/2015  . Rash and nonspecific skin eruption 07/29/2013  . Impaired glucose tolerance 12/27/2012  . Fatigue 12/26/2011  . Preventative health care 06/25/2011  . VITAMIN D DEFICIENCY 12/23/2010  . WEIGHT LOSS 12/23/2010  . 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 Pancytopenia ([place]) 07/13/2010  . ALCOHOL ABUSE 07/13/2010  . 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 Hyperlipidemia 12/22/2009  . HYPONATREMIA 12/22/2009  . Anxiety state 12/22/2009  . Depression 12/22/2009  . Essential hypertension 12/22/2009  . GALLSTONES 12/22/2009  . RENAL CYST, LEFT 12/22/2009  . Osteoporosis 12/22/2009  . OTHER NONSPECIFIC FINDING EXAMINATION OF URINE 12/22/2009    Past Surgical History:  Procedure Laterality Date  . ABDOMINAL HYSTERECTOMY    . IR GENERIC HISTORICAL  10/18/2016   IR RADIOLOGIST EVAL & MGMT 10/18/2016 [name], MD [facility] INTERV RAD  . IR RADIOLOGIST EVAL & MGMT  05/30/2017    OB History    No data available       Home Medications    Prior to Admission medications   Medication Sig Start Date End Date Taking? Authorizing Provider  ALPRAZolam Duanne Moron) 0.5 MG tablet TAKE ONE TABLET BY MOUTH AT BEDTIME 05/24/17  Biagio Borg, MD  amLODipine (NORVASC) 10 MG tablet Take 1 tablet (10 mg total) by mouth daily. 05/02/17   Biagio Borg, MD  Cholecalciferol (VITAMIN D3) 1000 UNITS CAPS Take 1 capsule by mouth every morning.     [provider]  citalopram (CELEXA) 20 MG tablet TAKE ONE-HALF TABLET BY MOUTH ONCE DAILY 12/26/16   Biagio Borg, MD  FLUOCINOLONE ACETONIDE SCALP 0.01 % OIL Reported on 01/25/2016 11/21/15   [provider]  folic acid (FOLVITE) 1 MG tablet TAKE ONE TABLET BY MOUTH ONCE DAILY 06/28/17   Biagio Borg, MD    hydrALAZINE (APRESOLINE) 100 MG tablet TAKE ONE TABLET BY MOUTH THREE TIMES DAILY 06/04/17   Biagio Borg, MD  ibuprofen (ADVIL,MOTRIN) 200 MG tablet Take 1-2 tablets (200-400 mg total) by mouth every 6 (six) hours as needed for fever, headache, mild pain or moderate pain. 03/07/15   Cherene Altes, MD  isosorbide dinitrate (ISORDIL) 10 MG tablet TAKE ONE TABLET BY MOUTH THREE TIMES DAILY WITH FOOD 06/04/17   Biagio Borg, MD  ketotifen (ZADITOR) 0.025 % ophthalmic solution Place 1 drop into both eyes 2 (two) times daily.    [provider]  lactulose (CHRONULAC) 10 GM/15ML solution Take 30 mLs (20 g total) by mouth daily. 02/28/17   Biagio Borg, MD  Multiple Vitamin (MULTIVITAMIN) tablet Take 1 tablet by mouth every morning.     [provider]  Polyethyl Glycol-Propyl Glycol (SYSTANE OP) Place 1 drop into both eyes every morning.     [provider]  propranolol (INDERAL) 40 MG tablet Take 1 tablet (40 mg total) by mouth 2 (two) times daily. 09/04/17   Biagio Borg, MD    Family History Family History  Problem Relation Age of Onset  . Lung cancer Brother   . Breast cancer Other   . Breast cancer Sister   . Gastric cancer Sister   . Lung cancer Sister   . Colon cancer Neg Hx   . Colon polyps Neg Hx   . Rectal cancer Neg Hx   . Stomach cancer Neg Hx   . Esophageal cancer Neg Hx     Social History Social History  Substance Use Topics  . Smoking status: Never Smoker  . Smokeless tobacco: Never Used  . Alcohol use 1.8 - 2.4 oz/week    3 - 4 Cans of beer per week     Comment: she quit 02/2015      Allergies   Patient has no known allergies.   Review of Systems Review of Systems  Unable to perform ROS: Mental status change  Respiratory: Positive for shortness of breath.      Physical Exam Updated Vital Signs BP (!) 160/73 (BP Location: Left Arm)   Pulse (!) 42   Temp (!) 97.4 F (36.3 C) (Oral)   Resp (!) 30   Ht 5\' 3"  (1.6 m)   Wt  57.6 kg (127 lb)   SpO2 98%   BMI 22.50 kg/m   Physical Exam  Nursing note and vitals reviewed.  72 year old female, resting comfortably and in no acute distress. Vital signs are significant for hypertension, bradycardia, tachypnea. Oxygen saturation is 98%, which is normal. Head is normocephalic and atraumatic. PERRLA, EOMI. Oropharynx is clear. Neck is nontender and supple without adenopathy or JVD. Back is nontender and there is no CVA tenderness. Lungs have coarse expiratory wheezes diffusely without rales. Chest is nontender. Heart has a bradycardic  without murmur. Abdomen is soft, flat, nontender without masses or hepatosplenomegaly and peristalsis is normoactive. Extremities have no cyanosis or edema, full range of motion is present. Skin is warm and dry without rash. Neurologic: She is awake and alert, but speech is mildly dysarthric and difficult to understand Cranial nerves are intact. She is generally weak, but there are no focal motor or sensory deficits. There is no asterixis.  ED Treatments / Results  Labs (all labs ordered are listed, but only abnormal results are displayed) Labs Reviewed  COMPREHENSIVE METABOLIC PANEL - Abnormal; Notable for the following:       Result Value   Sodium 110 (*)    Potassium 6.0 (*)    Chloride 84 (*)    CO2 17 (*)    Glucose, Bld 152 (*)    BUN 38 (*)    Creatinine, Ser 3.27 (*)    Albumin 2.7 (*)    GFR calc non Af Amer 13 (*)    GFR calc Af Amer 15 (*)    All other components within normal limits  CBC WITH DIFFERENTIAL/PLATELET - Abnormal; Notable for the following:    WBC 11.4 (*)    RBC 3.82 (*)    Hemoglobin 11.2 (*)    HCT 31.5 (*)    Neutro Abs 10.0 (*)    Lymphs Abs 0.6 (*)    All other components within normal limits  AMMONIA - Abnormal; Notable for the following:    Ammonia <9 (*)    All other components within normal limits  I-STAT TROPONIN, ED - Abnormal; Notable for the following:    Troponin i, poc 0.13 (*)     All other components within normal limits  I-STAT CHEM 8, ED - Abnormal; Notable for the following:    Sodium 112 (*)    Potassium 6.2 (*)    Chloride 85 (*)    BUN 36 (*)    Creatinine, Ser 3.30 (*)    Glucose, Bld 151 (*)    Calcium, Ion 1.06 (*)    TCO2 18 (*)    Hemoglobin 11.9 (*)    HCT 35.0 (*)    All other components within normal limits  PROTIME-INR  APTT  URINALYSIS, ROUTINE W REFLEX MICROSCOPIC  HEPARIN LEVEL (UNFRACTIONATED)  CBG MONITORING, ED    EKG  EKG Interpretation  Date/Time:  Sunday September 09 2017 23:03:25 EDT Ventricular Rate:  47 PR Interval:    QRS Duration: 84 QT Interval:  502 QTC Calculation: 444 R Axis:   74 Text Interpretation:  Atrial fibrillation with slow ventricular response with a competing junctional pacemaker Cannot rule out Inferior infarct , age undetermined Cannot rule out Anterior infarct , age undetermined Abnormal ECG ST-t abnormality consider ischemia  Lateral leads When compared with ECG of 03/04/2015, Atrial fibrillation with slow ventricular response has replaced Sinus bradycardia ST-t abnormality is now present Confirmed by Delora Fuel ([id number]) on 09/09/2017 11:48:56 PM       Radiology Dg Chest 2 View  Result Date: 09/10/2017 CLINICAL DATA:  Dyspnea this evening. EXAM: CHEST  2 VIEW COMPARISON:  02/27/2015 CXR and 03/06/2015 CT. FINDINGS: Top-normal sized heart with aortic atherosclerosis at the arch. Confluent airspace opacities are seen bilaterally, left greater than right sulcal which is likely due to pulmonary edema. Superimposed pneumonia at the left lung base and/or effusion is not excluded. No acute osseous abnormality. IMPRESSION: Hazy opacities at the lung bases, left greater than right. Pulmonary edema is suspected. Superimposed left lower lobe pneumonia  and/or pleural effusion is not excluded. Electronically Signed   By: Ashley Royalty M.D.   On: 09/10/2017 00:46   Ct Head Wo Contrast  Result Date: 09/10/2017 CLINICAL  DATA:  Slurred speech, last seen normal yesterday. EXAM: CT HEAD WITHOUT CONTRAST TECHNIQUE: Contiguous axial images were obtained from the base of the skull through the vertex without intravenous contrast. COMPARISON:  03/04/2015 head CT, 03/05/2015 MRI. FINDINGS: Brain: Interval progression of moderate to marked chronic small vessel ischemia. No acute intracranial hemorrhage, midline shift or edema. No intra-axial mass nor extra-axial fluid collections. No large vascular territory infarcts. Idiopathic bilateral basal ganglial calcifications. No hydrocephalus. Vascular: No hyperdense vessel or unexpected calcification. Moderate atherosclerosis of the cavernous internal carotids. Skull: Negative for fracture or suspicious osseous lesions. Dense falcine ossifications. Sinuses/Orbits: Mild ethmoid and left maxillary sinus mucosal thickening. The frontal and sphenoid sinuses are clear. Clear bilateral mastoids. Other: None IMPRESSION: 1. Progression of chronic small vessel ischemic disease since 2016. 2. No acute intracranial abnormality. Electronically Signed   By: Ashley Royalty M.D.   On: 09/10/2017 00:30    Procedures Procedures (including critical care time) CRITICAL CARE Performed by: YPPJK,DTOIZ Total critical care time: 60 minutes Critical care time was exclusive of separately billable procedures and treating other patients. Critical care was necessary to treat or prevent imminent or life-threatening deterioration. Critical care was time spent personally by me on the following activities: development of treatment plan with patient and/or surrogate as well as nursing, discussions with consultants, evaluation of patient's response to treatment, examination of patient, obtaining history from patient or surrogate, ordering and performing treatments and interventions, ordering and review of laboratory studies, ordering and review of radiographic studies, pulse oximetry and re-evaluation of patient's  condition.  Medications Ordered in ED Medications  albuterol (PROVENTIL) (2.5 MG/3ML) 0.083% nebulizer solution (  Not Given 09/09/17 2315)  heparin ADULT infusion 100 units/mL (25000 units/291mL sodium chloride 0.45%) (600 Units/hr Intravenous Rate/Dose Verify 09/10/17 0800)  albuterol (PROVENTIL) (2.5 MG/3ML) 0.083% nebulizer solution 2.5 mg (2.5 mg Nebulization Given 09/10/17 0411)  hydrALAZINE (APRESOLINE) injection 10-40 mg (not administered)  albuterol (PROVENTIL) (2.5 MG/3ML) 0.083% nebulizer solution 5 mg (5 mg Nebulization Given 09/09/17 2313)  ipratropium-albuterol (DUONEB) 0.5-2.5 (3) MG/3ML nebulizer solution 3 mL (3 mLs Nebulization Given 09/10/17 0106)  aspirin chewable tablet 324 mg (324 mg Oral Given 09/10/17 0055)  heparin bolus via infusion 2,000 Units (2,000 Units Intravenous Bolus from Bag 09/10/17 0204)  sodium chloride 0.9 % bolus 1,000 mL (0 mLs Intravenous Stopped 09/10/17 0245)  dextrose 50 % solution 25 mL (25 mLs Intravenous Given 09/10/17 0203)  insulin aspart (novoLOG) injection 5 Units (5 Units Intravenous Given 09/10/17 0203)     Initial Impression / Assessment and Plan / ED Course  I have reviewed the triage vital signs and the nursing notes.  Pertinent labs & imaging results that were available during my care of the patient were reviewed by me and considered in my medical decision making (see chart for details).  Discharge speech of uncertain cause. No other neurologic signs to suggest stroke. Last known normal greater than 24 hours ago, so not a candidate for code stroke protocol. Old records reviewed confirming history of alcoholic cirrhosis and hepatocellular carcinoma of which has been adequately treated and is currently in remission. Need to consider possibility of hepatic encephalopathy, so ammonia level will be checked. Symptoms could also be related to his cardiac output secondary to bradycardia. Of note, she had been seen in her physician's office  5 days ago and  propranolol dose was increased from 20 mg twice a day to 40 mg twice a day. But certainly may account for her bradycardia. I have reviewed all of her prior ECGs and see no prior atrial fibrillation, but she did have prior ECGs with A-V dissociation. She is also exhibiting significant bronchospasm. There is some subjective improvement following initial albuterol with ipratropium nebulizer treatment, and she is given additional albuterol with ipratropium. Chest x-ray is ordered. She is also being screened for occult infection with urinalysis.  CT shows no acute process. Laboratory workup shows evidence of acute kidney injury with creatinine 3.27 (was 2.28 on September 18), and severe hyponatremia with sodium 110 (was 126 on September 18). Troponin is mildly elevated at 0.13. It is not clear if this is a primary cardiac problem or related to the renal failure. INR is normal. She started on heparin to treat possible ACS, possible atrial fibrillation. She is also given oral aspirin. She is given a bolus of normal saline. Of note, following second albuterol with ipratropium nebulizer treatment, lungs are now completely clear. Case is discussed with Dr. Blaine Hamper of [facility] who is concerned that the patient is quite complex and requests critical care to evaluate the patient for admission. I discussed case with Dr. Janann Colonel who states that patient will be seen by the critical care rounder.  Final Clinical Impressions(s) / ED Diagnoses   Final diagnoses:  Acute kidney injury (nontraumatic) (HCC)  Bradycardia  Bronchospasm  Normochromic normocytic anemia  Elevated troponin    New Prescriptions New Prescriptions   No medications on file     Delora Fuel, MD 16/07/37 [phone]

## 2017-09-09 NOTE — ED Notes (Signed)
Abnormal chem-8 reported to Dr. Roxanne Mins

## 2017-09-10 ENCOUNTER — Emergency Department (HOSPITAL_COMMUNITY): Payer: Medicare Other

## 2017-09-10 ENCOUNTER — Inpatient Hospital Stay (HOSPITAL_COMMUNITY): Payer: Medicare Other

## 2017-09-10 ENCOUNTER — Encounter (HOSPITAL_COMMUNITY): Payer: Self-pay | Admitting: *Deleted

## 2017-09-10 DIAGNOSIS — D509 Iron deficiency anemia, unspecified: Secondary | ICD-10-CM | POA: Diagnosis present

## 2017-09-10 DIAGNOSIS — I129 Hypertensive chronic kidney disease with stage 1 through stage 4 chronic kidney disease, or unspecified chronic kidney disease: Secondary | ICD-10-CM | POA: Diagnosis present

## 2017-09-10 DIAGNOSIS — I5030 Unspecified diastolic (congestive) heart failure: Secondary | ICD-10-CM | POA: Diagnosis not present

## 2017-09-10 DIAGNOSIS — R4781 Slurred speech: Secondary | ICD-10-CM | POA: Diagnosis present

## 2017-09-10 DIAGNOSIS — J9601 Acute respiratory failure with hypoxia: Secondary | ICD-10-CM | POA: Diagnosis not present

## 2017-09-10 DIAGNOSIS — N179 Acute kidney failure, unspecified: Secondary | ICD-10-CM | POA: Diagnosis not present

## 2017-09-10 DIAGNOSIS — C220 Liver cell carcinoma: Secondary | ICD-10-CM | POA: Diagnosis not present

## 2017-09-10 DIAGNOSIS — F411 Generalized anxiety disorder: Secondary | ICD-10-CM | POA: Diagnosis present

## 2017-09-10 DIAGNOSIS — Z8505 Personal history of malignant neoplasm of liver: Secondary | ICD-10-CM | POA: Diagnosis not present

## 2017-09-10 DIAGNOSIS — N184 Chronic kidney disease, stage 4 (severe): Secondary | ICD-10-CM | POA: Diagnosis not present

## 2017-09-10 DIAGNOSIS — G9341 Metabolic encephalopathy: Secondary | ICD-10-CM

## 2017-09-10 DIAGNOSIS — Z79899 Other long term (current) drug therapy: Secondary | ICD-10-CM | POA: Diagnosis not present

## 2017-09-10 DIAGNOSIS — R748 Abnormal levels of other serum enzymes: Secondary | ICD-10-CM | POA: Diagnosis not present

## 2017-09-10 DIAGNOSIS — I248 Other forms of acute ischemic heart disease: Secondary | ICD-10-CM | POA: Diagnosis present

## 2017-09-10 DIAGNOSIS — R918 Other nonspecific abnormal finding of lung field: Secondary | ICD-10-CM | POA: Diagnosis not present

## 2017-09-10 DIAGNOSIS — N183 Chronic kidney disease, stage 3 (moderate): Secondary | ICD-10-CM

## 2017-09-10 DIAGNOSIS — E871 Hypo-osmolality and hyponatremia: Secondary | ICD-10-CM | POA: Diagnosis not present

## 2017-09-10 DIAGNOSIS — E872 Acidosis: Secondary | ICD-10-CM | POA: Diagnosis present

## 2017-09-10 DIAGNOSIS — D631 Anemia in chronic kidney disease: Secondary | ICD-10-CM | POA: Diagnosis not present

## 2017-09-10 DIAGNOSIS — K7030 Alcoholic cirrhosis of liver without ascites: Secondary | ICD-10-CM | POA: Diagnosis not present

## 2017-09-10 DIAGNOSIS — E875 Hyperkalemia: Secondary | ICD-10-CM | POA: Diagnosis present

## 2017-09-10 DIAGNOSIS — D649 Anemia, unspecified: Secondary | ICD-10-CM | POA: Diagnosis not present

## 2017-09-10 DIAGNOSIS — R7989 Other specified abnormal findings of blood chemistry: Secondary | ICD-10-CM

## 2017-09-10 DIAGNOSIS — R778 Other specified abnormalities of plasma proteins: Secondary | ICD-10-CM | POA: Diagnosis present

## 2017-09-10 DIAGNOSIS — F329 Major depressive disorder, single episode, unspecified: Secondary | ICD-10-CM | POA: Diagnosis present

## 2017-09-10 DIAGNOSIS — R4701 Aphasia: Secondary | ICD-10-CM | POA: Diagnosis present

## 2017-09-10 DIAGNOSIS — R001 Bradycardia, unspecified: Secondary | ICD-10-CM | POA: Diagnosis present

## 2017-09-10 DIAGNOSIS — R0602 Shortness of breath: Secondary | ICD-10-CM | POA: Diagnosis not present

## 2017-09-10 DIAGNOSIS — E785 Hyperlipidemia, unspecified: Secondary | ICD-10-CM | POA: Diagnosis present

## 2017-09-10 DIAGNOSIS — I161 Hypertensive emergency: Secondary | ICD-10-CM | POA: Diagnosis present

## 2017-09-10 LAB — ECHOCARDIOGRAM COMPLETE
Ao-asc: 29 cm
CHL CUP REG VEL DIAS: 121 cm/s
CHL CUP TV REG PEAK VELOCITY: 289 cm/s
E decel time: 289 msec
FS: 50 % — AB (ref 28–44)
Height: 63 in
IVS/LV PW RATIO, ED: 1.27
LA ID, A-P, ES: 36 mm
LA diam end sys: 36 mm
LA diam index: 2.15 cm/m2
LA vol A4C: 67.5 ml
LA vol index: 39.2 mL/m2
LAVOL: 65.7 mL
LDCA: 2.84 cm2
LV PW d: 11 mm — AB (ref 0.6–1.1)
LV e' LATERAL: 7.38 cm/s
LVOTD: 19 mm
MV Dec: 289
MVPKEVEL: 0.8 m/s
PV Reg grad dias: 6 mmHg
RV LATERAL S' VELOCITY: 14.6 cm/s
RV sys press: 36 mmHg
TAPSE: 29.1 mm
TDI e' lateral: 7.38
TDI e' medial: 4.38
TRMAXVEL: 289 cm/s
Weight: 2204.6 oz

## 2017-09-10 LAB — BASIC METABOLIC PANEL
ANION GAP: 15 (ref 5–15)
ANION GAP: 8 (ref 5–15)
Anion gap: 10 (ref 5–15)
Anion gap: 9 (ref 5–15)
BUN: 45 mg/dL — ABNORMAL HIGH (ref 6–20)
BUN: 49 mg/dL — AB (ref 6–20)
BUN: 51 mg/dL — AB (ref 6–20)
BUN: 54 mg/dL — ABNORMAL HIGH (ref 6–20)
CALCIUM: 8.4 mg/dL — AB (ref 8.9–10.3)
CALCIUM: 8.3 mg/dL — AB (ref 8.9–10.3)
CHLORIDE: 93 mmol/L — AB (ref 101–111)
CO2: 11 mmol/L — ABNORMAL LOW (ref 22–32)
CO2: 17 mmol/L — ABNORMAL LOW (ref 22–32)
CO2: 18 mmol/L — ABNORMAL LOW (ref 22–32)
CO2: 18 mmol/L — AB (ref 22–32)
CREATININE: 2.98 mg/dL — AB (ref 0.44–1.00)
Calcium: 8.3 mg/dL — ABNORMAL LOW (ref 8.9–10.3)
Calcium: 8.4 mg/dL — ABNORMAL LOW (ref 8.9–10.3)
Chloride: 89 mmol/L — ABNORMAL LOW (ref 101–111)
Chloride: 92 mmol/L — ABNORMAL LOW (ref 101–111)
Chloride: 95 mmol/L — ABNORMAL LOW (ref 101–111)
Creatinine, Ser: 3.1 mg/dL — ABNORMAL HIGH (ref 0.44–1.00)
Creatinine, Ser: 2.98 mg/dL — ABNORMAL HIGH (ref 0.44–1.00)
Creatinine, Ser: 3.07 mg/dL — ABNORMAL HIGH (ref 0.44–1.00)
GFR calc non Af Amer: 15 mL/min — ABNORMAL LOW (ref >60)
GFR calc non Af Amer: 14 mL/min — ABNORMAL LOW (ref >60)
GFR, EST AFRICAN AMERICAN: 16 mL/min — AB (ref >60)
GFR, EST AFRICAN AMERICAN: 17 mL/min — AB (ref >60)
GFR, EST AFRICAN AMERICAN: 17 mL/min — AB (ref >60)
GFR, EST AFRICAN AMERICAN: 16 mL/min — AB (ref >60)
GFR, EST NON AFRICAN AMERICAN: 14 mL/min — AB (ref >60)
GFR, EST NON AFRICAN AMERICAN: 15 mL/min — AB (ref >60)
GLUCOSE: 98 mg/dL (ref 65–99)
GLUCOSE: 101 mg/dL — AB (ref 65–99)
Glucose, Bld: 102 mg/dL — ABNORMAL HIGH (ref 65–99)
Glucose, Bld: 96 mg/dL (ref 65–99)
POTASSIUM: 4.6 mmol/L (ref 3.5–5.1)
POTASSIUM: 4.7 mmol/L (ref 3.5–5.1)
Potassium: 5.9 mmol/L — ABNORMAL HIGH (ref 3.5–5.1)
Potassium: 4.6 mmol/L (ref 3.5–5.1)
SODIUM: 115 mmol/L — AB (ref 135–145)
SODIUM: 119 mmol/L — AB (ref 135–145)
SODIUM: 120 mmol/L — AB (ref 135–145)
Sodium: 121 mmol/L — ABNORMAL LOW (ref 135–145)

## 2017-09-10 LAB — BLOOD GAS, ARTERIAL
Acid-base deficit: 7.9 mmol/L — ABNORMAL HIGH (ref 0.0–2.0)
Bicarbonate: 16.1 mmol/L — ABNORMAL LOW (ref 20.0–28.0)
Drawn by: 257081
FIO2: 1
O2 SAT: 94.5 %
PATIENT TEMPERATURE: 98.6
PCO2 ART: 27.1 mmHg — AB (ref 32.0–48.0)
pH, Arterial: 7.391 (ref 7.350–7.450)
pO2, Arterial: 78.4 mmHg — ABNORMAL LOW (ref 83.0–108.0)

## 2017-09-10 LAB — GLUCOSE, CAPILLARY
GLUCOSE-CAPILLARY: 101 mg/dL — AB (ref 65–99)
Glucose-Capillary: 127 mg/dL — ABNORMAL HIGH (ref 65–99)
Glucose-Capillary: 103 mg/dL — ABNORMAL HIGH (ref 65–99)

## 2017-09-10 LAB — COMPREHENSIVE METABOLIC PANEL
ALBUMIN: 2.7 g/dL — AB (ref 3.5–5.0)
ALK PHOS: 51 U/L (ref 38–126)
ALT: 19 U/L (ref 14–54)
ANION GAP: 9 (ref 5–15)
AST: 34 U/L (ref 15–41)
BUN: 38 mg/dL — ABNORMAL HIGH (ref 6–20)
CALCIUM: 8.9 mg/dL (ref 8.9–10.3)
CHLORIDE: 84 mmol/L — AB (ref 101–111)
CO2: 17 mmol/L — AB (ref 22–32)
Creatinine, Ser: 3.27 mg/dL — ABNORMAL HIGH (ref 0.44–1.00)
GFR calc Af Amer: 15 mL/min — ABNORMAL LOW (ref >60)
GFR calc non Af Amer: 13 mL/min — ABNORMAL LOW (ref >60)
GLUCOSE: 152 mg/dL — AB (ref 65–99)
POTASSIUM: 6 mmol/L — AB (ref 3.5–5.1)
SODIUM: 110 mmol/L — AB (ref 135–145)
Total Bilirubin: 0.5 mg/dL (ref 0.3–1.2)
Total Protein: 7 g/dL (ref 6.5–8.1)

## 2017-09-10 LAB — CBC
HCT: 32 % — ABNORMAL LOW (ref 36.0–46.0)
HEMOGLOBIN: 11.4 g/dL — AB (ref 12.0–15.0)
MCH: 29.4 pg (ref 26.0–34.0)
MCHC: 35.6 g/dL (ref 30.0–36.0)
MCV: 82.5 fL (ref 78.0–100.0)
Platelets: 128 10*3/uL — ABNORMAL LOW (ref 150–400)
RBC: 3.88 MIL/uL (ref 3.87–5.11)
RDW: 13.7 % (ref 11.5–15.5)
WBC: 9.9 10*3/uL (ref 4.0–10.5)

## 2017-09-10 LAB — OSMOLALITY, URINE: OSMOLALITY UR: 381 mosm/kg (ref 300–900)

## 2017-09-10 LAB — I-STAT TROPONIN, ED: Troponin i, poc: 0.18 ng/mL (ref 0.00–0.08)

## 2017-09-10 LAB — TROPONIN I
TROPONIN I: 0.15 ng/mL — AB (ref <0.03)
Troponin I: 0.16 ng/mL (ref <0.03)
Troponin I: 0.13 ng/mL (ref <0.03)

## 2017-09-10 LAB — URINALYSIS, ROUTINE W REFLEX MICROSCOPIC
BILIRUBIN URINE: NEGATIVE
Glucose, UA: NEGATIVE mg/dL
HGB URINE DIPSTICK: NEGATIVE
KETONES UR: NEGATIVE mg/dL
LEUKOCYTES UA: NEGATIVE
NITRITE: NEGATIVE
Specific Gravity, Urine: 1.014 (ref 1.005–1.030)
Squamous Epithelial / LPF: NONE SEEN
pH: 6 (ref 5.0–8.0)

## 2017-09-10 LAB — OSMOLALITY: Osmolality: 257 mOsm/kg — ABNORMAL LOW (ref 275–295)

## 2017-09-10 LAB — HEPARIN LEVEL (UNFRACTIONATED)
HEPARIN UNFRACTIONATED: 0.35 [IU]/mL (ref 0.30–0.70)
Heparin Unfractionated: <0.1 IU/mL — ABNORMAL LOW (ref 0.30–0.70)
Heparin Unfractionated: 0.44 IU/mL (ref 0.30–0.70)

## 2017-09-10 LAB — APTT: APTT: 33 s (ref 24–36)

## 2017-09-10 LAB — PHOSPHORUS: Phosphorus: 4.2 mg/dL (ref 2.5–4.6)

## 2017-09-10 LAB — AMMONIA: Ammonia: <9 umol/L — ABNORMAL LOW (ref 9–35)

## 2017-09-10 LAB — PROTIME-INR
INR: 1.11
PROTHROMBIN TIME: 14.2 s (ref 11.4–15.2)

## 2017-09-10 LAB — SODIUM: Sodium: 114 mmol/L — CL (ref 135–145)

## 2017-09-10 LAB — SODIUM, URINE, RANDOM

## 2017-09-10 LAB — MRSA PCR SCREENING: MRSA BY PCR: NEGATIVE

## 2017-09-10 LAB — MAGNESIUM: Magnesium: 1.8 mg/dL (ref 1.7–2.4)

## 2017-09-10 IMAGING — US US RENAL
1 series · 14 of 25 positions shown · non-contrast
Comparison: Abdominal MRI [DATE]

CLINICAL DATA: Acute kidney injury

EXAM:
RENAL / URINARY TRACT ULTRASOUND COMPLETE

[Series 1: us renal · 0.22mm/px · 14 of 37 slices shown]
[im 1/37]
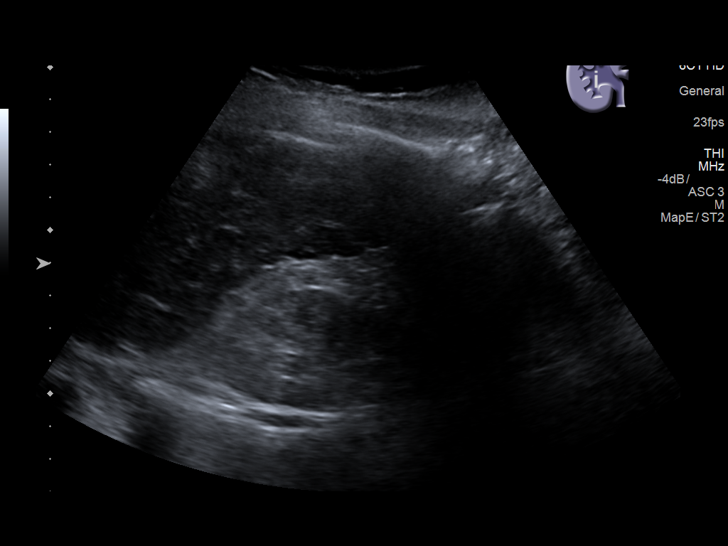
[im 4/37]
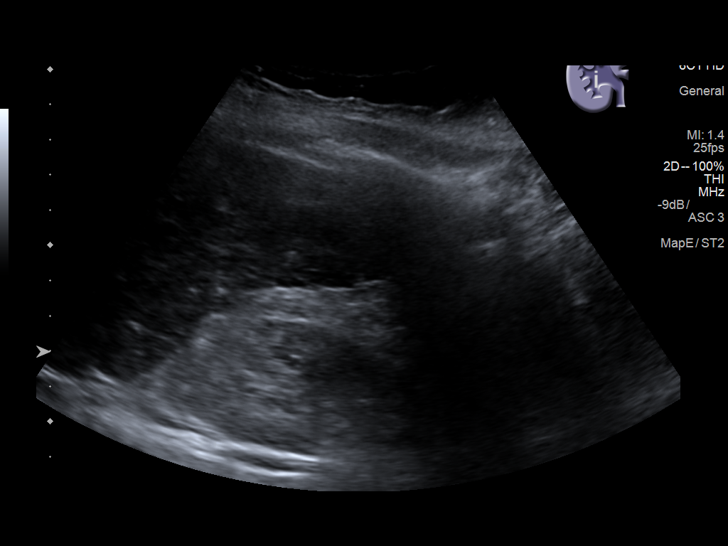
[im 7/37]
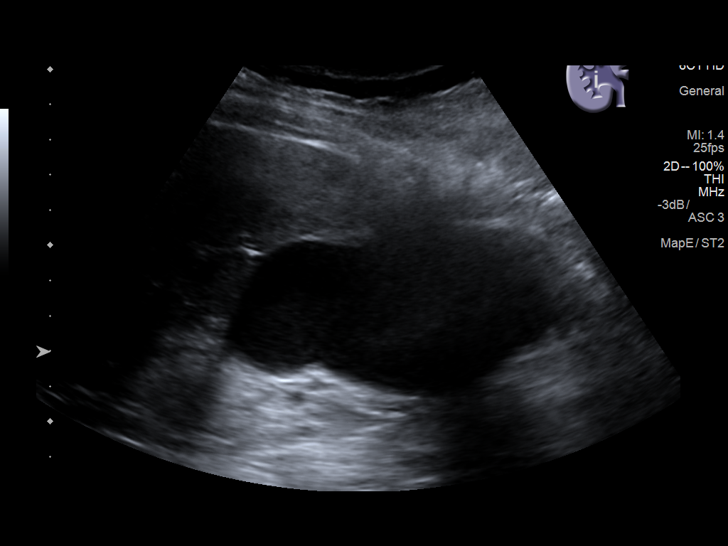
[im 10/37]
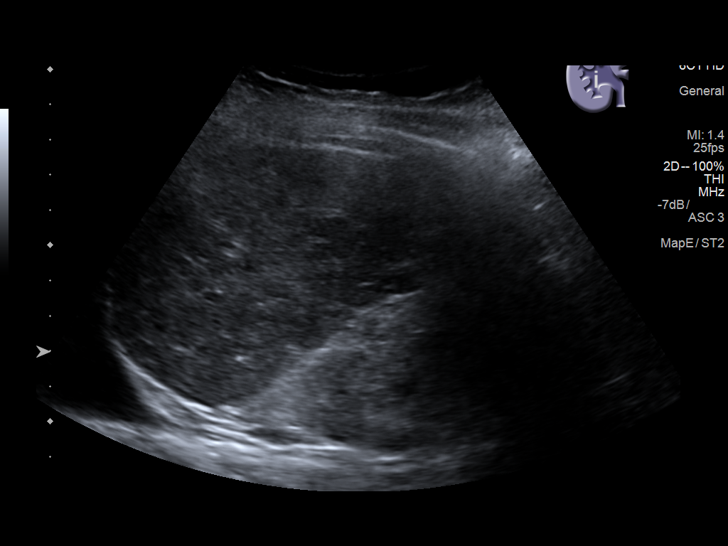
[im 13/37]
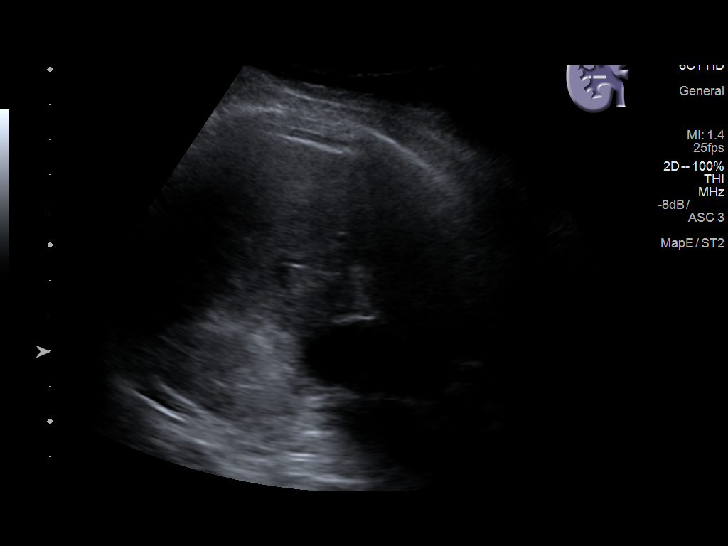
[im 14/37]
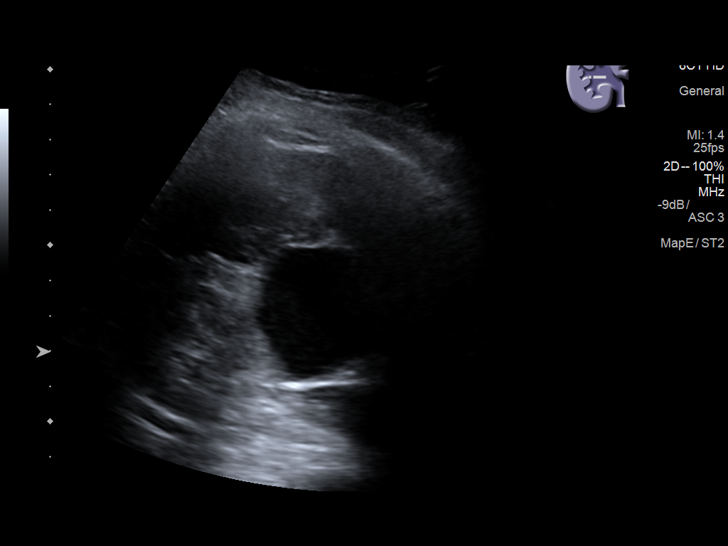
[im 17/37]
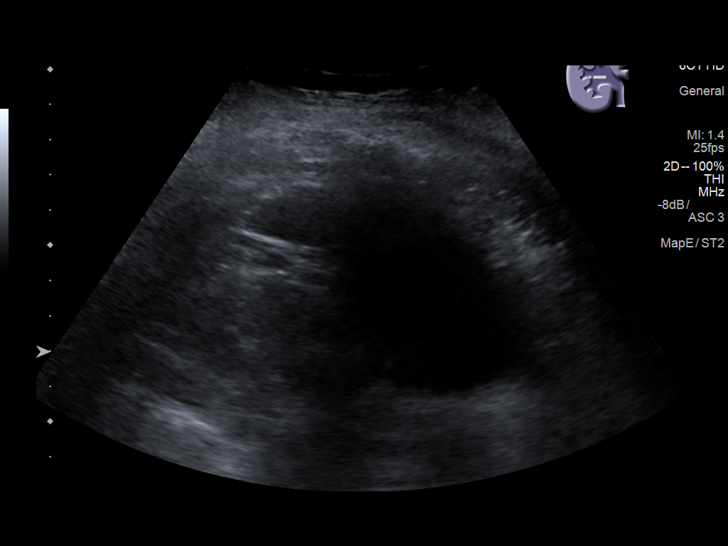
[im 20/37]
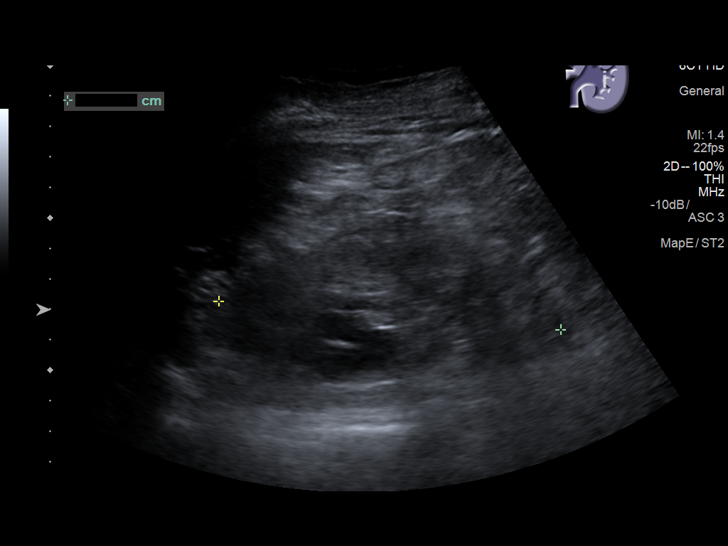
[im 23/37]
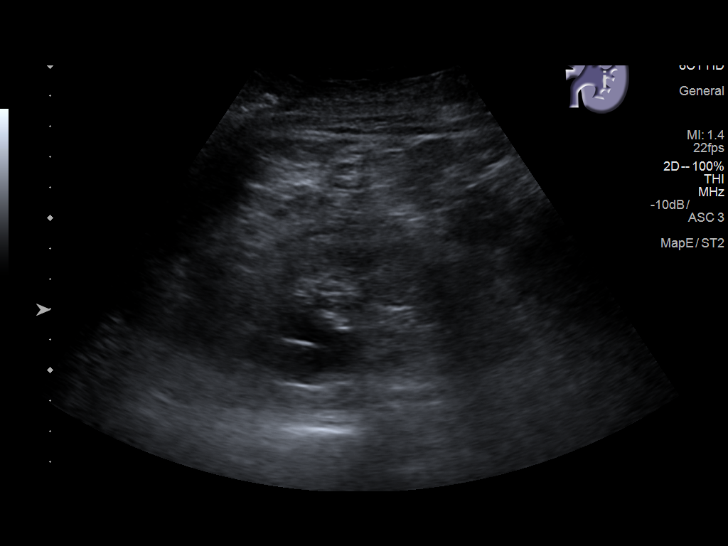
[im 25/37]
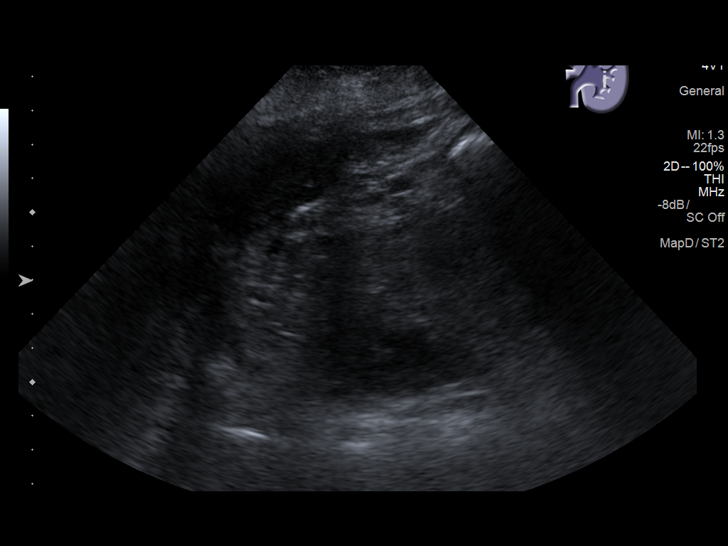
[im 28/37]
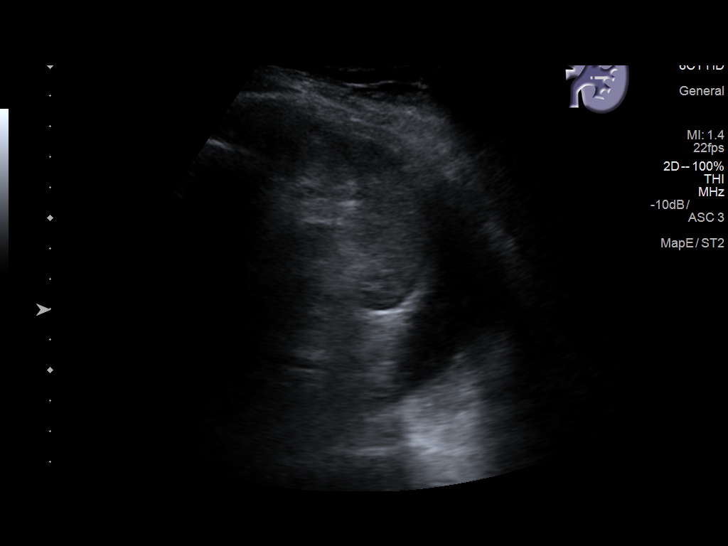
[im 31/37]
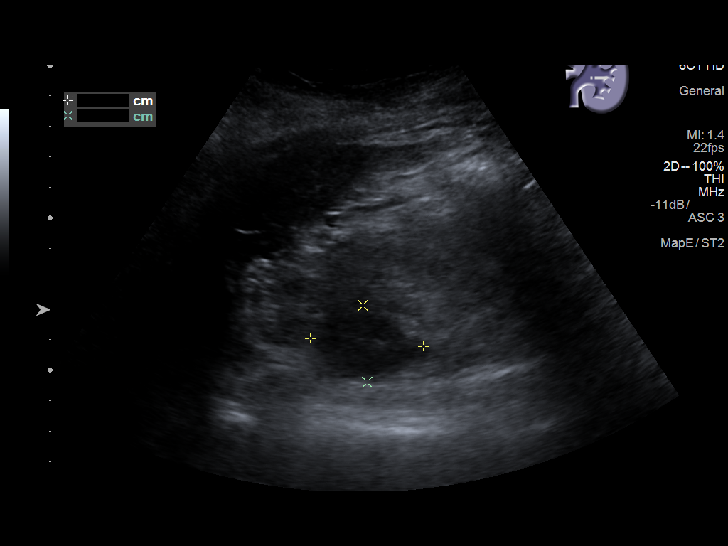
[im 34/37]
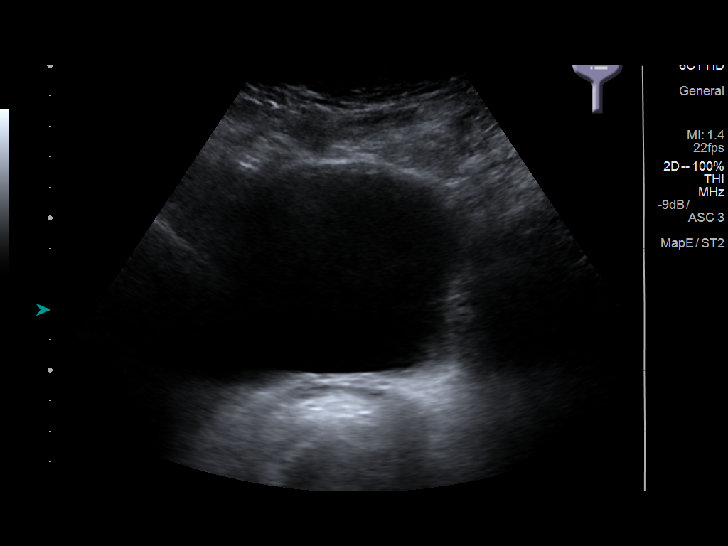
[im 37/37]
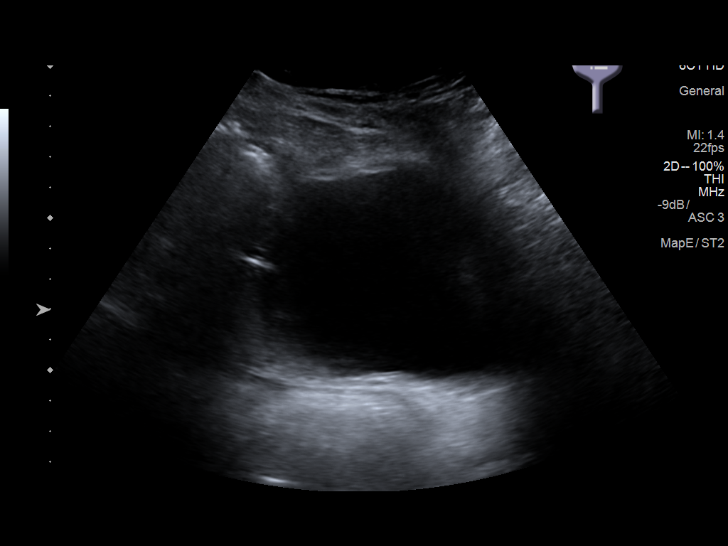

[14 of 25 positions shown; findings below may reference images not displayed]

FINDINGS: Right Kidney:

Length: 9.2 cm.  Renal parenchyma is echogenic.  No hydronephrosis.

Left Kidney:

Length: 11.2 cm. Renal parenchyma is echogenic. There is a complex
cystic lesion near the left upper pole that measures 3.7 x 2.5 x
cm. No hydronephrosis.

Bladder:

Appears normal for degree of bladder distention.

Other:

Distended gallbladder, as seen on MRI of [DATE].

Bilateral pleural effusions.
IMPRESSION: 1. No hydronephrosis. Echogenic renal parenchyma, consistent with
chronic medical renal disease.
2. Bilateral pleural effusions, incompletely visualized.
3. Distended gallbladder, as on MRI of [DATE].

## 2017-09-10 MED ORDER — HYDRALAZINE HCL 20 MG/ML IJ SOLN
10.0000 mg | INTRAMUSCULAR | Status: DC | PRN
  Administered 2017-09-13: 10 mg via INTRAVENOUS
  Administered 2017-09-13: 20 mg via INTRAVENOUS
  Administered 2017-09-13: 10 mg via INTRAVENOUS
  Filled 2017-09-10 (×3): qty 1

## 2017-09-10 MED ORDER — ORAL CARE MOUTH RINSE
15.0000 mL | Freq: Two times a day (BID) | OROMUCOSAL | Status: DC
  Administered 2017-09-10 – 2017-09-17 (×10): 15 mL via OROMUCOSAL

## 2017-09-10 MED ORDER — ALBUTEROL SULFATE (2.5 MG/3ML) 0.083% IN NEBU
2.5000 mg | INHALATION_SOLUTION | RESPIRATORY_TRACT | Status: DC | PRN
  Administered 2017-09-10: 2.5 mg via RESPIRATORY_TRACT
  Filled 2017-09-10: qty 3

## 2017-09-10 MED ORDER — DEXTROSE 50 % IV SOLN
25.0000 mL | Freq: Once | INTRAVENOUS | Status: AC
  Administered 2017-09-10: 25 mL via INTRAVENOUS
  Filled 2017-09-10: qty 50

## 2017-09-10 MED ORDER — SODIUM CHLORIDE 0.9 % IV SOLN
250.0000 mL | INTRAVENOUS | Status: DC | PRN
  Administered 2017-09-10: 250 mL via INTRAVENOUS

## 2017-09-10 MED ORDER — SODIUM CHLORIDE 3 % IV SOLN
INTRAVENOUS | Status: DC
  Administered 2017-09-10: 33 mL/h via INTRAVENOUS
  Filled 2017-09-10 (×2): qty 500

## 2017-09-10 MED ORDER — ASPIRIN 81 MG PO CHEW
324.0000 mg | CHEWABLE_TABLET | Freq: Once | ORAL | Status: AC
  Administered 2017-09-10: 324 mg via ORAL
  Filled 2017-09-10: qty 4

## 2017-09-10 MED ORDER — HEPARIN (PORCINE) IN NACL 100-0.45 UNIT/ML-% IJ SOLN
600.0000 [IU]/h | INTRAMUSCULAR | Status: DC
  Administered 2017-09-10: 600 [IU]/h via INTRAVENOUS
  Filled 2017-09-10: qty 250

## 2017-09-10 MED ORDER — ISOSORBIDE DINITRATE 10 MG PO TABS
10.0000 mg | ORAL_TABLET | Freq: Three times a day (TID) | ORAL | Status: DC
  Administered 2017-09-10 – 2017-09-13 (×10): 10 mg via ORAL
  Filled 2017-09-10 (×11): qty 1

## 2017-09-10 MED ORDER — IPRATROPIUM-ALBUTEROL 0.5-2.5 (3) MG/3ML IN SOLN
3.0000 mL | Freq: Four times a day (QID) | RESPIRATORY_TRACT | Status: DC
  Administered 2017-09-10 – 2017-09-11 (×4): 3 mL via RESPIRATORY_TRACT
  Filled 2017-09-10 (×4): qty 3

## 2017-09-10 MED ORDER — SODIUM CHLORIDE 0.9 % IV BOLUS (SEPSIS)
1000.0000 mL | Freq: Once | INTRAVENOUS | Status: AC
  Administered 2017-09-10: 1000 mL via INTRAVENOUS

## 2017-09-10 MED ORDER — INSULIN ASPART 100 UNIT/ML ~~LOC~~ SOLN
5.0000 [IU] | Freq: Once | SUBCUTANEOUS | Status: AC
  Administered 2017-09-10: 5 [IU] via INTRAVENOUS
  Filled 2017-09-10: qty 1

## 2017-09-10 MED ORDER — HEPARIN BOLUS VIA INFUSION
2000.0000 [IU] | Freq: Once | INTRAVENOUS | Status: AC
  Administered 2017-09-10: 2000 [IU] via INTRAVENOUS
  Filled 2017-09-10: qty 2000

## 2017-09-10 NOTE — ED Notes (Signed)
Elevated trop reported to Dr. Roxanne Mins

## 2017-09-10 NOTE — Progress Notes (Signed)
RT called to evaluate patient due to Spo2 <88% on 2Lpm & contiued wheezing.  Placed pt on 55% venturi mask. Albuterol nebulizer given. Pt states she does not feel SOB.

## 2017-09-10 NOTE — ED Notes (Signed)
Patient transported to X-ray 

## 2017-09-10 NOTE — Progress Notes (Signed)
ANTICOAGULATION CONSULT NOTE - Initial Consult  Pharmacy Consult for heparin Indication: chest pain/ACS  No Known Allergies  Patient Measurements: Height: 5\' 3"  (160 cm) Weight: 127 lb (57.6 kg) IBW/kg (Calculated) : 52.4  Vital Signs: Temp: 97.4 F (36.3 C) (09/23 2307) Temp Source: Oral (09/23 2307) BP: 165/68 (09/23 2345) Pulse Rate: 36 (09/23 2345)  Labs:  Recent Labs  09/09/17 2343 09/09/17 2352  HGB 11.2* 11.9*  HCT 31.5* 35.0*  PLT 169  --   APTT 33  --   LABPROT 14.2  --   INR 1.11  --   CREATININE 3.27* 3.30*    Estimated Creatinine Clearance: 12.7 mL/min (A) (by C-G formula based on SCr of 3.3 mg/dL (H)).   Medical History: Past Medical History:  Diagnosis Date  . ALCOHOL ABUSE 07/13/2010  . ANXIETY 12/22/2009  . DEPRESSION 12/22/2009  . 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 DJD (degenerative joint disease), cervical    patient denies on preop of 05/10/15   . FATIGUE 12/23/2010  . GALLSTONES 12/22/2009  . Gallstones   . Headache    occasional   . HYPERLIPIDEMIA 12/22/2009  . HYPERTENSION 12/22/2009  . HYPONATREMIA 12/22/2009  . 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 OSTEOPENIA 12/22/2009  . Pancytopenia 07/13/2010  . RENAL CYST, LEFT 12/22/2009  . VITAMIN D DEFICIENCY 12/23/2010  . WEIGHT LOSS 12/23/2010    Assessment: 71yo female c/o slurred speech and SOB, concern for hepatic encephalopathy, initial istat troponin elevated, to begin heparin.  Goal of Therapy:  Heparin level 0.3-0.7 units/ml Monitor platelets by anticoagulation protocol: Yes   Plan:  Will give heparin 2000 units IV bolus x1 followed by gtt at 600 units/hr and monitor heparin levels and CBC.  Wynona Neat, PharmD, BCPS  09/10/2017,12:25 AM

## 2017-09-10 NOTE — Progress Notes (Signed)
Placed on NRB due to Spo2 <90%.  Maintaining at 88%-89%.  Spo2 improved to 94%. RN aware.

## 2017-09-10 NOTE — Progress Notes (Signed)
CRITICAL VALUE ALERT  Critical Value:  SODIUM 114  Date & Time Notied: 3143  Provider Notified: Dr Titus Mould   Orders Received/Actions taken: yes

## 2017-09-10 NOTE — ED Notes (Addendum)
Pt returned from [place]. Stable. Family at bedside.

## 2017-09-10 NOTE — Progress Notes (Signed)
ANTICOAGULATION CONSULT NOTE - Follow Up Consult  Pharmacy Consult for heparin Indication: chest pain/ACS  No Known Allergies  Patient Measurements: Height: 5\' 3"  (160 cm) Weight: 137 lb 12.6 oz (62.5 kg) IBW/kg (Calculated) : 52.4  Vital Signs: Temp: 97.6 F (36.4 C) (09/24 1600) Temp Source: Oral (09/24 1600) BP: 170/66 (09/24 1600) Pulse Rate: 65 (09/24 1600)  Labs:  Recent Labs  09/09/17 2343 09/09/17 2352 09/10/17 0110 09/10/17 0852 09/10/17 1541  HGB 11.2* 11.9*  --  11.4*  --   HCT 31.5* 35.0*  --  32.0*  --   PLT 169  --   --  128*  --   APTT 33  --   --   --   --   LABPROT 14.2  --   --   --   --   INR 1.11  --   --   --   --   HEPARINUNFRC  --   --  <0.10* 0.35 0.44  CREATININE 3.27* 3.30*  --  3.10*  --   TROPONINI  --   --   --  0.16*  --     Estimated Creatinine Clearance: 13.6 mL/min (A) (by C-G formula based on SCr of 3.1 mg/dL (H)).   Medical History: Past Medical History:  Diagnosis Date  . ALCOHOL ABUSE 07/13/2010  . ANXIETY 12/22/2009  . DEPRESSION 12/22/2009  . 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 DJD (degenerative joint disease), cervical    patient denies on preop of 05/10/15   . FATIGUE 12/23/2010  . GALLSTONES 12/22/2009  . Gallstones   . Headache    occasional   . HYPERLIPIDEMIA 12/22/2009  . HYPERTENSION 12/22/2009  . HYPONATREMIA 12/22/2009  . 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 OSTEOPENIA 12/22/2009  . Pancytopenia 07/13/2010  . RENAL CYST, LEFT 12/22/2009  . VITAMIN D DEFICIENCY 12/23/2010  . WEIGHT LOSS 12/23/2010    Assessment: 71yo female c/o slurred speech and SOB, concern for hepatic encephalopathy, initial istat troponin elevated, currently on IV heparin. HL this am was therapeutic at 0.35. Next heparin 8 hr = 0.44 remains therapeutic on heparin 600 units/hr.  No bleeding noted. This morning's H/H low stable. Plt down to 128k. SCr remains elevated at 3.1.   Goal of Therapy:  Heparin level 0.3-0.7 units/ml Monitor platelets  by anticoagulation protocol: Yes   Plan:  -Continue IV heparin at 600 units/hr  -Monitor daily HL, CBC and s/s of bleeding    Nicole Cella, RPh Clinical Pharmacist Pager: [phone] 09/10/2017 4:36 PM

## 2017-09-10 NOTE — Progress Notes (Signed)
ANTICOAGULATION CONSULT NOTE - Follow Up Consult  Pharmacy Consult for heparin Indication: chest pain/ACS  No Known Allergies  Patient Measurements: Height: 5\' 3"  (160 cm) Weight: 137 lb 12.6 oz (62.5 kg) IBW/kg (Calculated) : 52.4  Vital Signs: Temp: 97.6 F (36.4 C) (09/24 1155) Temp Source: Axillary (09/24 1155) BP: 169/69 (09/24 1000) Pulse Rate: 56 (09/24 1000)  Labs:  Recent Labs  09/09/17 2343 09/09/17 2352 09/10/17 0110 09/10/17 0852  HGB 11.2* 11.9*  --  11.4*  HCT 31.5* 35.0*  --  32.0*  PLT 169  --   --  128*  APTT 33  --   --   --   LABPROT 14.2  --   --   --   INR 1.11  --   --   --   HEPARINUNFRC  --   --  <0.10* 0.35  CREATININE 3.27* 3.30*  --  3.10*  TROPONINI  --   --   --  0.16*    Estimated Creatinine Clearance: 13.6 mL/min (A) (by C-G formula based on SCr of 3.1 mg/dL (H)).   Medical History: Past Medical History:  Diagnosis Date  . ALCOHOL ABUSE 07/13/2010  . ANXIETY 12/22/2009  . DEPRESSION 12/22/2009  . 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 DJD (degenerative joint disease), cervical    patient denies on preop of 05/10/15   . FATIGUE 12/23/2010  . GALLSTONES 12/22/2009  . Gallstones   . Headache    occasional   . HYPERLIPIDEMIA 12/22/2009  . HYPERTENSION 12/22/2009  . HYPONATREMIA 12/22/2009  . 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 OSTEOPENIA 12/22/2009  . Pancytopenia 07/13/2010  . RENAL CYST, LEFT 12/22/2009  . VITAMIN D DEFICIENCY 12/23/2010  . WEIGHT LOSS 12/23/2010    Assessment: 71yo female c/o slurred speech and SOB, concern for hepatic encephalopathy, initial istat troponin elevated, currently on IV heparin. HL this am is therapeutic at 0.35. H/H low stable. Plt down to 128k. SCr remains elevated at 3.1.   Goal of Therapy:  Heparin level 0.3-0.7 units/ml Monitor platelets by anticoagulation protocol: Yes   Plan:  -Continue IV heparin at 600 units/hr  -F/u 8 hr HL  -Monitor daily HL, CBC and s/s of bleeding    Albertina Parr, PharmD., BCPS Clinical Pharmacist Pager [phone]

## 2017-09-10 NOTE — Progress Notes (Signed)
CRITICAL VALUE ALERT  Critical Value: sodium 115  Date & Time Notied:  09/10/2017  Provider Notified: Theadora Rama NP  Orders Received/Actions taken:Countinue current tx.

## 2017-09-10 NOTE — Progress Notes (Signed)
  Echocardiogram 2D Echocardiogram has been performed.  Sarah Harmon 09/10/2017, 3:10 PM

## 2017-09-10 NOTE — ED Notes (Signed)
Resp called to eval pt. Pt has increased work of breathing and more wheezing with O2 sats remaining 84-86% with 2l [place]. Family at bedside. O2 increased to 6L  [place] per [place], resp. Therapist. Will continue to monitor.

## 2017-09-10 NOTE — Progress Notes (Signed)
CRITICAL VALUE ALERT  Critical Value:  Sodium 119  Date & Time Notied:  0/24/2018 1730  Provider Notified: Dr Nelda Marseille   Orders Received/Actions taken: see nursing order.

## 2017-09-10 NOTE — ED Notes (Signed)
Patient transported to CT 

## 2017-09-10 NOTE — H&P (Signed)
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 Recently seen a primary care physician found to have significantly elevated blood pressure and started on increased dose of propranolol. Her daughters bedside reports the patient lives alone and prepares her own meals was in her usual state of health approximately 48 hours ago. However this evening she was noted to be complaining of back pain, and she was lethargic and slurring her speech. In the emergency department she was noted to have junctional bradycardia, hypertension, severe hyponatremia (110). She was additionally found to have an elevated troponin and was started on a heparin infusion and given aspirin. She reports back pain which she can't adequately quantify or qualify. She reports pain over all areas identified. She cannot tell me if the pain is radiating to anywhere else or report any provocative or relieving factors. Review of systems is limited likely due to some component of encephalopathy from the hyponatremia.   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  PAST SURGICAL HISTORY: She  has a past surgical history that includes Abdominal  hysterectomy; ir generic historical (10/18/2016); and IR Radiologist Eval & Mgmt (05/30/2017).  No Known Allergies  No current facility-administered medications on file prior to encounter.    Current Outpatient Prescriptions on File Prior to Encounter  Medication Sig  . ALPRAZolam (XANAX) 0.5 MG tablet TAKE ONE TABLET BY MOUTH AT BEDTIME  . amLODipine (NORVASC) 10 MG tablet Take 1 tablet (10 mg total) by mouth daily.  . Cholecalciferol (VITAMIN D3) 1000 UNITS CAPS Take 200 Units by mouth every morning.   . citalopram (CELEXA) 20 MG tablet TAKE ONE-HALF TABLET BY MOUTH ONCE DAILY  . folic acid (FOLVITE) 1 MG tablet TAKE ONE TABLET BY MOUTH ONCE DAILY  . hydrALAZINE (APRESOLINE) 100 MG tablet TAKE ONE TABLET BY MOUTH THREE TIMES DAILY  . ibuprofen (ADVIL,MOTRIN) 200 MG tablet Take 1-2 tablets (200-400 mg total) by mouth every 6 (six) hours as needed for fever, headache, mild pain or moderate pain.  . isosorbide dinitrate (ISORDIL) 10 MG tablet TAKE ONE TABLET BY MOUTH THREE TIMES DAILY WITH FOOD  . lactulose (CHRONULAC) 10 GM/15ML solution Take 30 mLs (20 g total) by mouth daily.  . propranolol (INDERAL) 40 MG tablet Take 1 tablet (40 mg total) by mouth 2 (two) times daily.    FAMILY HISTORY:  Her indicated that her brother is deceased. She indicated that the status of her neg hx is unknown. She indicated that her other is alive.    SOCIAL HISTORY: She  reports that she has never smoked. She has never used smokeless tobacco. She reports that she drinks about 1.8 - 2.4 oz of alcohol per week . She reports that she does not use drugs.  REVIEW OF SYSTEMS:   Unable to obtain due to encephalopathy  SUBJECTIVE:  Back pain  VITAL SIGNS: BP (!) 166/74   Pulse Marland Kitchen)  40   Temp (!) 97.2 F (36.2 C)   Resp (!) 24   Ht 5\' 3"  (1.6 m)   Wt 127 lb (57.6 kg)   SpO2 (!) 88%   BMI 22.50 kg/m   HEMODYNAMICS:    VENTILATOR SETTINGS:    INTAKE / OUTPUT: No intake/output data  recorded.  PHYSICAL EXAMINATION: General:  Appears younger than her stated age in no acute distress Neuro:  Alert and oriented 1 moves all extremities no focal deficit HEENT:  ATNC, EOMI, PERRLA, MO MM Cardiovascular:  Bradycardic regular no murmur no clubbing cyanosis edema Lungs:  Diffuse expiratory wheezing Abdomen:  Soft nontender nondistended no hepatosplenomegaly Musculoskeletal:  No red warm swollen tender deformed joints Skin:  Warm dry no rash  LABS:  BMET  Recent Labs Lab 09/04/17 0943 09/09/17 2343 09/09/17 2352  NA 126* 110* 112*  K 4.2 6.0* 6.2*  CL 95* 84* 85*  CO2 23 17*  --   BUN 23 38* 36*  CREATININE 2.28* 3.27* 3.30*  GLUCOSE 121* 152* 151*    Electrolytes  Recent Labs Lab 09/04/17 0943 09/09/17 2343  CALCIUM 9.3 8.9    CBC  Recent Labs Lab 09/04/17 0943 09/09/17 2343 09/09/17 2352  WBC 3.9* 11.4*  --   HGB 12.3 11.2* 11.9*  HCT 36.9 31.5* 35.0*  PLT 177.0 169  --     Coag's  Recent Labs Lab 09/09/17 2343  APTT 33  INR 1.11    Sepsis Markers No results for input(s): LATICACIDVEN, PROCALCITON, O2SATVEN in the last 168 hours.  ABG No results for input(s): PHART, PCO2ART, PO2ART in the last 168 hours.  Liver Enzymes  Recent Labs Lab 09/04/17 0943 09/09/17 2343  AST 20 34  ALT 11 19  ALKPHOS 70 51  BILITOT 0.3 0.5  ALBUMIN 3.1* 2.7*    Cardiac Enzymes No results for input(s): TROPONINI, PROBNP in the last 168 hours.  Glucose No results for input(s): GLUCAP in the last 168 hours.  Imaging Dg Chest 2 View  Result Date: 09/10/2017 CLINICAL DATA:  Dyspnea this evening. EXAM: CHEST  2 VIEW COMPARISON:  02/27/2015 CXR and 03/06/2015 CT. FINDINGS: Top-normal sized heart with aortic atherosclerosis at the arch. Confluent airspace opacities are seen bilaterally, left greater than right sulcal which is likely due to pulmonary edema. Superimposed pneumonia at the left lung base and/or effusion is not excluded. No acute  osseous abnormality. IMPRESSION: Hazy opacities at the lung bases, left greater than right. Pulmonary edema is suspected. Superimposed left lower lobe pneumonia and/or pleural effusion is not excluded. Electronically Signed   By: Ashley Royalty M.D.   On: 09/10/2017 00:46   Ct Head Wo Contrast  Result Date: 09/10/2017 CLINICAL DATA:  Slurred speech, last seen normal yesterday. EXAM: CT HEAD WITHOUT CONTRAST TECHNIQUE: Contiguous axial images were obtained from the base of the skull through the vertex without intravenous contrast. COMPARISON:  03/04/2015 head CT, 03/05/2015 MRI. FINDINGS: Brain: Interval progression of moderate to marked chronic small vessel ischemia. No acute intracranial hemorrhage, midline shift or edema. No intra-axial mass nor extra-axial fluid collections. No large vascular territory infarcts. Idiopathic bilateral basal ganglial calcifications. No hydrocephalus. Vascular: No hyperdense vessel or unexpected calcification. Moderate atherosclerosis of the cavernous internal carotids. Skull: Negative for fracture or suspicious osseous lesions. Dense falcine ossifications. Sinuses/Orbits: Mild ethmoid and left maxillary sinus mucosal thickening. The frontal and sphenoid sinuses are clear. Clear bilateral mastoids. Other: None IMPRESSION: 1. Progression of chronic small vessel ischemic disease since 2016. 2. No acute  intracranial abnormality. Electronically Signed   By: Ashley Royalty M.D.   On: 09/10/2017 00:30     STUDIES:  Chemistries show hyponatremia of 110 with hyperkalemia, non-anion gap metabolic acidosis, acute kidney injury decreased albumin normal bilirubin troponin of 0.13. White blood cell count is increased from baseline, anemia is somewhat worse 11.2. Coags are normal. Glucose mildly elevated.  EKG shows what appears to be junctional bradycardia  Chest x-ray is consistent with bilateral infiltrates likely pulmonary edema per radiology read. On my repeat I don't see a dense  infiltrate  CULTURES:   ANTIBIOTICS:   SIGNIFICANT EVENTS: 9/24 admitted to ICU  LINES/TUBES:   DISCUSSION: Mrs. Maiolo has progressive renal failure and acute worsening on chronic hyponatremia which has developed over the last 6 days. Her junctional bradycardia may be due to AV nodal blockers in the setting of acute renal failure. Cause for renal failure is unclear and may be due to profound hypertensive state that she's been in and that renal hypoperfusion has lead to the worsening of the hyponatremia. She is not decompensated cirrhosis. I do not believe she is suffering from hepatic encephalopathy given her undetectable ammonia. Her overall fluid balance is difficult to assess, she clearly does not appear fluid overloaded. Her elevated troponin could represent ACS especially given her back pain which could be an atypical anginal equivalent however the renal failure could also be implicated in the elevated troponin. She does not appear to have an infectious etiology. Hypoxia could be multifactorial given her cirrhosis she could have pulmonary syndrome however pulmonary edema and also be contributing.  ASSESSMENT / PLAN:  PULMONARY A: Acute hypoxic respiratory failure P:   Supplemental oxygen Duo nebs When necessary albuterol  CARDIOVASCULAR A:  Junctional bradycardia Hypertensive urgency P:  Hold AV nodal blockers Telemetry When necessary hydralazine  RENAL A:   Acute kidney injury on CKD Hyperkalemia Hyponatremia P:   Given she is symptomatic (slurred speech lethargy) Will begin 3% saline Goal to raise sodium by 6 and a Q in 24 hours (estimated 61 ML and our of 3% normal saline) Serial labs  GASTROINTESTINAL A:   cirrhosis, not decompensated, not encephalopathic Hepatocellular carcinoma P:   Supportive care  HEMATOLOGIC A:   DVT prophylaxis P:  Subcutaneous heparin  INFECTIOUS A:   No sign of infection P:   Monitor  ENDOCRINE A:   No active  issue   P:   Monitor  NEUROLOGIC A:   Metabolic encephalopathy P:   RASS goal: 0 Treat underlying cause   FAMILY  - Updates: Daughters bedside  - Inter-disciplinary family meet or Palliative Care meeting due by:  09/16/17   Upon my evaluation, this patient had a high probability of imminent or life-threatening deterioration due to severe hyponatremia, severe bradycardia  high complexity decision making to assess, manipulate, and support vital organ system failure including 3% saline  I have personally provided 30 minutes of critical care time exclusive of time spent on separately billable procedures and education. Time includes review and summation of previous medical record, laboratory data, radiology results, independent review of chest x-ray, and monitoring for potential decompensation. Interventions were performed as documented above.  Condition: Critical Prognosis: Guarded Code Status: Full  Charlesetta Garibaldi, MD  Critical Care Medicine [facility] Pager: [phone]   09/10/2017, 3:45 AM

## 2017-09-10 NOTE — ED Notes (Signed)
Critical care MD at bedside 

## 2017-09-10 NOTE — ED Notes (Signed)
Patient transported to Ultrasound 

## 2017-09-10 NOTE — ED Notes (Signed)
Dr Roxanne Mins given a copy of troponin results .[place]

## 2017-09-11 ENCOUNTER — Other Ambulatory Visit: Payer: Self-pay

## 2017-09-11 DIAGNOSIS — R748 Abnormal levels of other serum enzymes: Secondary | ICD-10-CM

## 2017-09-11 DIAGNOSIS — K7030 Alcoholic cirrhosis of liver without ascites: Secondary | ICD-10-CM

## 2017-09-11 LAB — BASIC METABOLIC PANEL
ANION GAP: 7 (ref 5–15)
BUN: 61 mg/dL — AB (ref 6–20)
CALCIUM: 8.3 mg/dL — AB (ref 8.9–10.3)
CO2: 18 mmol/L — ABNORMAL LOW (ref 22–32)
CREATININE: 2.81 mg/dL — AB (ref 0.44–1.00)
Chloride: 99 mmol/L — ABNORMAL LOW (ref 101–111)
GFR, EST AFRICAN AMERICAN: 18 mL/min — AB (ref >60)
GFR, EST NON AFRICAN AMERICAN: 16 mL/min — AB (ref >60)
GLUCOSE: 96 mg/dL (ref 65–99)
Potassium: 4.6 mmol/L (ref 3.5–5.1)
SODIUM: 124 mmol/L — AB (ref 135–145)

## 2017-09-11 LAB — CBC
HCT: 27.4 % — ABNORMAL LOW (ref 36.0–46.0)
HEMOGLOBIN: 9.8 g/dL — AB (ref 12.0–15.0)
MCH: 29.7 pg (ref 26.0–34.0)
MCHC: 35.8 g/dL (ref 30.0–36.0)
MCV: 83 fL (ref 78.0–100.0)
Platelets: 129 10*3/uL — ABNORMAL LOW (ref 150–400)
RBC: 3.3 MIL/uL — AB (ref 3.87–5.11)
RDW: 14.1 % (ref 11.5–15.5)
WBC: 10 10*3/uL (ref 4.0–10.5)

## 2017-09-11 LAB — HEPARIN LEVEL (UNFRACTIONATED): HEPARIN UNFRACTIONATED: 0.42 [IU]/mL (ref 0.30–0.70)

## 2017-09-11 LAB — SODIUM
Sodium: 124 mmol/L — ABNORMAL LOW (ref 135–145)
Sodium: 123 mmol/L — ABNORMAL LOW (ref 135–145)

## 2017-09-11 MED ORDER — FOLIC ACID 1 MG PO TABS
1.0000 mg | ORAL_TABLET | Freq: Every day | ORAL | Status: DC
  Administered 2017-09-11 – 2017-09-17 (×7): 1 mg via ORAL
  Filled 2017-09-11 (×7): qty 1

## 2017-09-11 MED ORDER — CHOLECALCIFEROL 10 MCG (400 UNIT) PO TABS
200.0000 [IU] | ORAL_TABLET | Freq: Every morning | ORAL | Status: DC
  Administered 2017-09-11 – 2017-09-13 (×3): 200 [IU] via ORAL
  Filled 2017-09-11 (×4): qty 1

## 2017-09-11 MED ORDER — HYDRALAZINE HCL 50 MG PO TABS
100.0000 mg | ORAL_TABLET | Freq: Three times a day (TID) | ORAL | Status: DC
  Administered 2017-09-11 – 2017-09-17 (×20): 100 mg via ORAL
  Filled 2017-09-11 (×22): qty 2

## 2017-09-11 MED ORDER — CITALOPRAM HYDROBROMIDE 20 MG PO TABS
10.0000 mg | ORAL_TABLET | Freq: Every day | ORAL | Status: DC
  Administered 2017-09-11 – 2017-09-17 (×7): 10 mg via ORAL
  Filled 2017-09-11 (×7): qty 1

## 2017-09-11 MED ORDER — AMLODIPINE BESYLATE 10 MG PO TABS
10.0000 mg | ORAL_TABLET | Freq: Every day | ORAL | Status: DC
  Administered 2017-09-11 – 2017-09-17 (×7): 10 mg via ORAL
  Filled 2017-09-11 (×7): qty 1

## 2017-09-11 NOTE — Progress Notes (Signed)
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 Recently seen a primary care physician found to have significantly elevated blood pressure and started on increased dose of propranolol. Her daughters bedside reports the patient lives alone and prepares her own meals was in her usual state of health approximately 48 hours ago. However, evening of 9/23 she was noted to be complaining of back pain, lethargic and slurring her speech. In the emergency department she was noted to have junctional bradycardia, hypertension, severe hyponatremia (110). She was additionally found to have an elevated troponin and was started on a heparin infusion and given aspirin.   SUBJECTIVE:   VITAL SIGNS: BP (!) 174/80 (BP Location: Left Arm)   Pulse 71   Temp 99 F (37.2 C) (Oral)   Resp (!) 26   Ht 5\' 3"  (1.6 m)   Wt 137 lb 12.6 oz (62.5 kg)   SpO2 95%   BMI 24.41 kg/m   HEMODYNAMICS:    VENTILATOR SETTINGS: FiO2 (%):  [60 %] 60 %  INTAKE / OUTPUT: I/O last 3 completed shifts: In: 1338.9 [I.V.:688.9; IV Piggyback:650] Out: 4098 [Urine:1530]  PHYSICAL EXAMINATION: General: thin adult female in NAD  HEENT: MM pink/moist, no jvd PSY: calm/appropriate  Neuro: AAOx4, speech clear, MAE  CV: s1s2 rrr, no m/r/g PULM: even/non-labored, lungs bilaterally clear JX:BJYN, non-tender, bsx4 active  Extremities: warm/dry, no edema  Skin: no rashes or lesions  LABS:  BMET  Recent Labs Lab 09/10/17 1754 09/10/17 2047 09/11/17 0801  NA 120* 121* 124*  K 4.6 4.7 4.6  CL 93* 95* 99*  CO2 18* 18* 18*  BUN 51* 54* 61*  CREATININE 2.98* 3.07* 2.81*  GLUCOSE 96 101* 96    Electrolytes  Recent Labs Lab 09/10/17 0852   09/10/17 1754 09/10/17 2047 09/11/17 0801  CALCIUM 8.4*  < > 8.3* 8.4* 8.3*  MG 1.8  --   --   --   --   PHOS 4.2  --   --   --   --   < > = values in this interval not displayed.  CBC  Recent Labs Lab 09/09/17 2343 09/09/17 2352 09/10/17 0852 09/11/17 0321  WBC 11.4*  --  9.9 10.0  HGB 11.2* 11.9* 11.4* 9.8*  HCT 31.5* 35.0* 32.0* 27.4*  PLT 169  --  128* 129*    Coag's  Recent Labs Lab 09/09/17 2343  APTT 33  INR 1.11    Sepsis Markers No results for input(s): LATICACIDVEN, PROCALCITON, O2SATVEN in the last 168 hours.  ABG  Recent Labs Lab 09/10/17 0950  PHART 7.391  PCO2ART 27.1*  PO2ART 78.4*    Liver Enzymes  Recent Labs Lab 09/04/17 0943 09/09/17 2343  AST 20 34  ALT 11 19  ALKPHOS 70 51  BILITOT 0.3 0.5  ALBUMIN 3.1* 2.7*    Cardiac Enzymes  Recent Labs Lab 09/10/17 0852 09/10/17 1541 09/10/17 2047  TROPONINI 0.16* 0.15* 0.13*    Glucose  Recent Labs Lab 09/10/17 0807 09/10/17 1613 09/10/17 2016  GLUCAP 127* 101* 103*    Imaging No results found.   STUDIES:  Renal US 9/25 >>   CULTURES:   ANTIBIOTICS:   SIGNIFICANT EVENTS: 9/24 Admitted to ICU  9/25 Transfer to tele  LINES/TUBES:   DISCUSSION: 71 y/o F with cirrhosis (autoimmue, +/- ETOH), hepatocellular carcinoma s/p treatment admitted with back pain, hypertensive urgency, acute on chronic hyponatremia, progressive renal failure & junctional bradycardia (on propranolol).  She did not appear fluid overloaded on admit.    ASSESSMENT / PLAN:  PULMONARY A: Acute hypoxic respiratory failure P:   O2 as needed to support sats > 90% Pulmonary hygiene Intermittent CXR  CARDIOVASCULAR A:  Junctional bradycardia - on propranolol at baseline Hypertensive urgency Mild Troponin Leak - likely demand ischemia in setting of HTN emergency P:  Tele monitoring  Hold propranolol  Continue Imdur Resume Norvasc, Hydralazine  Discontinue heparin gtt   RENAL A:    Acute kidney injury on CKD Hyperkalemia Hyponatremia P:   Trend BMP / urinary output Replace electrolytes as indicated Avoid nephrotoxic agents, ensure adequate renal perfusion KVO IVF Discontinue IVF's Monitor serum sodium  Await renal US   GASTROINTESTINAL A:   Cirrhosis, not decompensated, not encephalopathic Hepatocellular carcinoma P:   Regular diet as tolerated   HEMATOLOGIC A:   DVT prophylaxis P:  Trend CBC SCD's for DVT prophylaxis   INFECTIOUS A:   No sign of infection P:   Monitor   ENDOCRINE A:   No active issue   P:   Monitor  NEUROLOGIC A:   Metabolic encephalopathy P:   RASS goal: 0  Supportive care   FAMILY  - Updates: No family at bedside. Patient updated on plan of care.   - Inter-disciplinary family meet or Palliative Care meeting due by:  09/16/17   - Global:  Transition out of ICU to tele, [place] as of 9/26 am.   Noe Gens, NP-C [facility] Pgr: [phone] or if no answer [phone] 09/11/2017, 9:12 AM

## 2017-09-11 NOTE — Progress Notes (Signed)
ANTICOAGULATION CONSULT NOTE - Follow Up Consult  Pharmacy Consult for heparin Indication: chest pain/ACS  No Known Allergies  Patient Measurements: Height: 5\' 3"  (160 cm) Weight: 137 lb 12.6 oz (62.5 kg) IBW/kg (Calculated) : 52.4  Vital Signs: Temp: 99 F (37.2 C) (09/25 0800) Temp Source: Oral (09/25 0800) BP: 174/80 (09/25 0600) Pulse Rate: 71 (09/25 0600)  Labs:  Recent Labs  09/09/17 2343 09/09/17 2352  09/10/17 0852 09/10/17 1541 09/10/17 1754 09/10/17 2047 09/11/17 0321  HGB 11.2* 11.9*  --  11.4*  --   --   --  9.8*  HCT 31.5* 35.0*  --  32.0*  --   --   --  27.4*  PLT 169  --   --  128*  --   --   --  129*  APTT 33  --   --   --   --   --   --   --   LABPROT 14.2  --   --   --   --   --   --   --   INR 1.11  --   --   --   --   --   --   --   HEPARINUNFRC  --   --   < > 0.35 0.44  --   --  0.42  CREATININE 3.27* 3.30*  --  3.10* 2.98* 2.98* 3.07*  --   TROPONINI  --   --   --  0.16* 0.15*  --  0.13*  --   < > = values in this interval not displayed.  Estimated Creatinine Clearance: 13.7 mL/min (A) (by C-G formula based on SCr of 3.07 mg/dL (H)).   Medical History: Past Medical History:  Diagnosis Date  . ALCOHOL ABUSE 07/13/2010  . ANXIETY 12/22/2009  . DEPRESSION 12/22/2009  . 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 DJD (degenerative joint disease), cervical    patient denies on preop of 05/10/15   . FATIGUE 12/23/2010  . GALLSTONES 12/22/2009  . Gallstones   . Headache    occasional   . HYPERLIPIDEMIA 12/22/2009  . HYPERTENSION 12/22/2009  . HYPONATREMIA 12/22/2009  . 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 OSTEOPENIA 12/22/2009  . Pancytopenia 07/13/2010  . RENAL CYST, LEFT 12/22/2009  . VITAMIN D DEFICIENCY 12/23/2010  . WEIGHT LOSS 12/23/2010    Assessment: 71yo female c/o slurred speech and SOB, concern for hepatic encephalopathy, initial istat troponin elevated, currently on IV heparin. HL this am was therapeutic at 0.42 on heparin 600 units/hr. No  bleeding. This morning's Hgb low at 9.8. Plt down to 129k. SCr remains elevated at 3.07.   Goal of Therapy:  Heparin level 0.3-0.7 units/ml Monitor platelets by anticoagulation protocol: Yes   Plan:  -Continue IV heparin at 600 units/hr  -Monitor daily HL, CBC and s/s of bleeding    Angus Seller, PharmD Pharmacy Resident Pager: [phone] 09/11/2017 8:20 AM

## 2017-09-11 NOTE — Progress Notes (Signed)
Patient trasfered from 49M to [phone] via wheelchair; alert and oriented x 4; no complaints of pain; IV saline locked in RAC and RFA; Orient patient to room and unit; instructed how to use the call bell and  fall risk precautions. Will continue to monitor the patient.

## 2017-09-12 DIAGNOSIS — C220 Liver cell carcinoma: Secondary | ICD-10-CM

## 2017-09-12 DIAGNOSIS — D649 Anemia, unspecified: Secondary | ICD-10-CM

## 2017-09-12 LAB — BASIC METABOLIC PANEL
Anion gap: 11 (ref 5–15)
BUN: 60 mg/dL — AB (ref 6–20)
CO2: 17 mmol/L — ABNORMAL LOW (ref 22–32)
CREATININE: 2.85 mg/dL — AB (ref 0.44–1.00)
Calcium: 8.3 mg/dL — ABNORMAL LOW (ref 8.9–10.3)
Chloride: 97 mmol/L — ABNORMAL LOW (ref 101–111)
GFR calc Af Amer: 18 mL/min — ABNORMAL LOW (ref >60)
GFR, EST NON AFRICAN AMERICAN: 15 mL/min — AB (ref >60)
GLUCOSE: 91 mg/dL (ref 65–99)
POTASSIUM: 4 mmol/L (ref 3.5–5.1)
Sodium: 125 mmol/L — ABNORMAL LOW (ref 135–145)

## 2017-09-12 LAB — CBC
HCT: 25.2 % — ABNORMAL LOW (ref 36.0–46.0)
HEMOGLOBIN: 8.8 g/dL — AB (ref 12.0–15.0)
MCH: 29 pg (ref 26.0–34.0)
MCHC: 34.9 g/dL (ref 30.0–36.0)
MCV: 83.2 fL (ref 78.0–100.0)
Platelets: 162 10*3/uL (ref 150–400)
RBC: 3.03 MIL/uL — ABNORMAL LOW (ref 3.87–5.11)
RDW: 14.1 % (ref 11.5–15.5)
WBC: 9.5 10*3/uL (ref 4.0–10.5)

## 2017-09-12 LAB — SODIUM
Sodium: 126 mmol/L — ABNORMAL LOW (ref 135–145)
Sodium: 126 mmol/L — ABNORMAL LOW (ref 135–145)

## 2017-09-12 LAB — MAGNESIUM: MAGNESIUM: 1.8 mg/dL (ref 1.7–2.4)

## 2017-09-12 MED ORDER — ALPRAZOLAM 0.25 MG PO TABS
0.2500 mg | ORAL_TABLET | Freq: Every evening | ORAL | Status: DC | PRN
  Administered 2017-09-12 – 2017-09-16 (×5): 0.25 mg via ORAL
  Filled 2017-09-12 (×5): qty 1

## 2017-09-12 MED ORDER — ENSURE ENLIVE PO LIQD
237.0000 mL | Freq: Three times a day (TID) | ORAL | Status: DC
  Administered 2017-09-12 – 2017-09-17 (×13): 237 mL via ORAL

## 2017-09-12 NOTE — Progress Notes (Signed)
Initial Nutrition Assessment  DOCUMENTATION CODES:   Not applicable  INTERVENTION:   -Ensure Enlive po TID, each supplement provides 350 kcal and 20 grams of protein  NUTRITION DIAGNOSIS:   Inadequate oral intake related to poor appetite as evidenced by meal completion < 25%.  GOAL:   Patient will meet greater than or equal to 90% of their needs  MONITOR:   PO intake, Supplement acceptance, Diet advancement, Labs, Weight trends, Skin, I & O's  REASON FOR ASSESSMENT:   Malnutrition Screening Tool    ASSESSMENT:   Mrs. Pingleton has progressive renal failure and acute worsening on chronic hyponatremia which has developed over the last 6 days.   Pt admitted with hypokalemia and renal failure.   Spoke with pt, who reports poor appetite at baseline. She shares she consumes 3 small meals per day in addition to two Ensure supplements. Meals include foods such as yogurt, eggs, or a half sandwich. She reports she has not been eating much here; she consumed only a cup of applesauce for breakfast. RN also confirmed that pt has been eating very little.   Noted pt with no teeth. She reports that she has difficulty chewing tougher foods such as steak, but often consumes fried chicken and cooked vegetables without difficulty. Offered to mechanically alter pt diet, however, she politely declined, stating she would cut up her food herself.   Pt reports UBW is around 129#. She shares wt fluctuates due to fluid rentention. No wt loss over the past 6 months.   Nutrition-Focused physical exam completed. Findings are no fat depletion, mild muscle depletion (lower extremities only), and no edema.   Discussed importance of good meal and supplement intake to promote healing. Pt amenable to continue Ensure supplements both during hospitalization and after discharge.   Labs reviewed: Na: 125 (on IV supplementation).   Diet Order:  Diet regular Room service appropriate? Yes; Fluid consistency:  Thin  Skin:  Reviewed, no issues  Last BM:  09/09/17  Height:   Ht Readings from Last 1 Encounters:  09/11/17 5\' 3"  (1.6 m)    Weight:   Wt Readings from Last 1 Encounters:  09/12/17 141 lb 15.6 oz (64.4 kg)    Ideal Body Weight:  52.3 kg  BMI:  Body mass index is 25.15 kg/m.  Estimated Nutritional Needs:   Kcal:  1600-1800  Protein:  75-90 grams  Fluid:  1.6-1.8 L  EDUCATION NEEDS:   Education needs addressed  Marybell Robards A. Jimmye Norman, RD, LDN, CDE Pager: [phone] After hours Pager: [phone]

## 2017-09-12 NOTE — Progress Notes (Signed)
PROGRESS NOTE    Sarah Harmon  YNW:[id number] DOB: [birth year] DOA: 09/09/2017 PCP: Biagio Borg, MD   Brief Narrative: 71 y/o F with PMH of cirrhosis (felt to be due to autoimmune with a possible component of alcohol), hepatocellular carcinoma status post treatment, hypertension was admitted on the 9/23 for acute onset of lethargy and slurred speech and acute encephalopathy.    Assessment & Plan:   Active Problems:   Anxiety state   Depression   Hyponatremia   Alcoholic cirrhosis of liver without ascites (HCC)   Bradycardia   Acute renal failure superimposed on stage 3 chronic kidney disease (HCC)   Cancer, hepatocellular (HCC)   Hyperkalemia   Acute metabolic encephalopathy   Elevated troponin   Slurred speech   Hypertensive crisis:  Unclear etiology.  Possibly non compliant to meds.  Improved.    Elevated troponins, possibly demand ischemia from hypertensive emergency:  No chest pain.  She was started on IV heparin initially and later discontinued as her hemoglobin dropped. No obvious signs of bleeding.  She is being monitored off heparin.    Acute kidney injury: baseline creatinine appears to be between 1.7 to 2.2.  Came in with creatinine of 3.2.  Nephrotoxins are discontinued.  Gentle hydration.  Renal ultrasound pending.    Acute encephalopathy possibly from acute onset hyponatremia of unclear etiology, metabolic in nature.  improving.  Sodium is 126 today. Check in am.     Liver cirrhosis:  Hepatocellular carcinoma:  Outpatient follow up .    Anemia:  Baseline around 12. Currently around 9. Anemia panel ordered for tomorrow.  Monitor. f less than 7, will transfuse .       DVT prophylaxis: scd's Code Status: full code.  Family Communication: none at bedside, discussed the plan with the patient.  Disposition Plan: pending normal sodium .   Consultants:   None.   Procedures:none.  Antimicrobials:none.   Subjective: Denies any new  complaints.   Objective: Vitals:   09/11/17 1900 09/11/17 2103 09/12/17 0459 09/12/17 1454  BP: (!) 179/69 (!) 179/69 (!) 178/63 (!) 169/66  Pulse: 82 84 92 87  Resp: 18 18 18 18   Temp: 98.6 F (37 C) 98.6 F (37 C) 98.8 F (37.1 C) 98.7 F (37.1 C)  TempSrc: Oral Oral Oral Oral  SpO2: 99% 97% 96% 94%  Weight:   64.4 kg (141 lb 15.6 oz)   Height:        Intake/Output Summary (Last 24 hours) at 09/12/17 1756 Last data filed at 09/12/17 1609  Gross per 24 hour  Intake              360 ml  Output              300 ml  Net               60 ml   Filed Weights   09/11/17 0600 09/11/17 1527 09/12/17 0459  Weight: 62.5 kg (137 lb 12.6 oz) 65 kg (143 lb 4.8 oz) 64.4 kg (141 lb 15.6 oz)    Examination:  General exam: Appears calm and comfortable  Respiratory system: Clear to auscultation. Respiratory effort normal. Cardiovascular system: S1 & S2 heard, RRR. No JVD, murmurs, rubs, gallops or clicks. No pedal edema. Gastrointestinal system: Abdomen is nondistended, soft and nontender. No organomegaly or masses felt. Normal bowel sounds heard. Central nervous system: Alert and oriented. No focal neurological deficits. Extremities: Symmetric 5 x 5 power. Skin: No rashes, lesions or  ulcers Psychiatry: Judgement and insight appear normal. Mood & affect appropriate.     Data Reviewed: I have personally reviewed following labs and imaging studies  CBC:  Recent Labs Lab 09/09/17 2343 09/09/17 2352 09/10/17 0852 09/11/17 0321 09/12/17 0352  WBC 11.4*  --  9.9 10.0 9.5  NEUTROABS 10.0*  --   --   --   --   HGB 11.2* 11.9* 11.4* 9.8* 8.8*  HCT 31.5* 35.0* 32.0* 27.4* 25.2*  MCV 82.5  --  82.5 83.0 83.2  PLT 169  --  128* 129* 854   Basic Metabolic Panel:  Recent Labs Lab 09/10/17 0852 09/10/17 1541 09/10/17 1754 09/10/17 2047 09/11/17 0801 09/11/17 1152 09/11/17 2038 09/12/17 0352 09/12/17 1349  NA 115*  114* 119* 120* 121* 124* 124* 123* 125* 126*  K 5.9* 4.6  4.6 4.7 4.6  --   --  4.0  --   CL 89* 92* 93* 95* 99*  --   --  97*  --   CO2 11* 17* 18* 18* 18*  --   --  17*  --   GLUCOSE 102* 98 96 101* 96  --   --  91  --   BUN 45* 49* 51* 54* 61*  --   --  60*  --   CREATININE 3.10* 2.98* 2.98* 3.07* 2.81*  --   --  2.85*  --   CALCIUM 8.4* 8.3* 8.3* 8.4* 8.3*  --   --  8.3*  --   MG 1.8  --   --   --   --   --   --  1.8  --   PHOS 4.2  --   --   --   --   --   --   --   --    GFR: Estimated Creatinine Clearance: 16.1 mL/min (A) (by C-G formula based on SCr of 2.85 mg/dL (H)). Liver Function Tests:  Recent Labs Lab 09/09/17 2343  AST 34  ALT 19  ALKPHOS 51  BILITOT 0.5  PROT 7.0  ALBUMIN 2.7*   No results for input(s): LIPASE, AMYLASE in the last 168 hours.  Recent Labs Lab 09/09/17 2343  AMMONIA <9*   Coagulation Profile:  Recent Labs Lab 09/09/17 2343  INR 1.11   Cardiac Enzymes:  Recent Labs Lab 09/10/17 0852 09/10/17 1541 09/10/17 2047  TROPONINI 0.16* 0.15* 0.13*   BNP (last 3 results) No results for input(s): PROBNP in the last 8760 hours. HbA1C: No results for input(s): HGBA1C in the last 72 hours. CBG:  Recent Labs Lab 09/10/17 0807 09/10/17 1613 09/10/17 2016  GLUCAP 127* 101* 103*   Lipid Profile: No results for input(s): CHOL, HDL, LDLCALC, TRIG, CHOLHDL, LDLDIRECT in the last 72 hours. Thyroid Function Tests: No results for input(s): TSH, T4TOTAL, FREET4, T3FREE, THYROIDAB in the last 72 hours. Anemia Panel: No results for input(s): VITAMINB12, FOLATE, FERRITIN, TIBC, IRON, RETICCTPCT in the last 72 hours. Sepsis Labs: No results for input(s): PROCALCITON, LATICACIDVEN in the last 168 hours.  Recent Results (from the past 240 hour(s))  MRSA PCR Screening     Status: None   Collection Time: 09/10/17  8:44 AM  Result Value Ref Range Status   MRSA by PCR NEGATIVE NEGATIVE Final    Comment:        The GeneXpert MRSA Assay (FDA approved for NASAL specimens only), is one component of  a comprehensive MRSA colonization surveillance program. It is not intended to diagnose MRSA infection nor  to guide or monitor treatment for MRSA infections.          Radiology Studies: No results found.      Scheduled Meds: . amLODipine  10 mg Oral Daily  . cholecalciferol  200 Units Oral q morning - 10a  . citalopram  10 mg Oral Daily  . feeding supplement (ENSURE ENLIVE)  237 mL Oral TID BM  . folic acid  1 mg Oral Daily  . hydrALAZINE  100 mg Oral TID  . isosorbide dinitrate  10 mg Oral TID  . mouth rinse  15 mL Mouth Rinse BID   Continuous Infusions: . sodium chloride 250 mL (09/11/17 1200)     LOS: 2 days    Time spent: 25 minutes.     Hosie Poisson, MD [facility] Pager [phone]  If 7PM-7AM, please contact night-coverage [email or web address] Password [facility] 09/12/2017, 5:56 PM

## 2017-09-13 LAB — CBC
HEMATOCRIT: 25.3 % — AB (ref 36.0–46.0)
HEMOGLOBIN: 8.9 g/dL — AB (ref 12.0–15.0)
MCH: 29.5 pg (ref 26.0–34.0)
MCHC: 35.2 g/dL (ref 30.0–36.0)
MCV: 83.8 fL (ref 78.0–100.0)
Platelets: 184 10*3/uL (ref 150–400)
RBC: 3.02 MIL/uL — ABNORMAL LOW (ref 3.87–5.11)
RDW: 14.5 % (ref 11.5–15.5)
WBC: 10 10*3/uL (ref 4.0–10.5)

## 2017-09-13 LAB — IRON AND TIBC
Iron: 19 ug/dL — ABNORMAL LOW (ref 28–170)
SATURATION RATIOS: 7 % — AB (ref 10.4–31.8)
TIBC: 286 ug/dL (ref 250–450)
UIBC: 267 ug/dL

## 2017-09-13 LAB — FOLATE: Folate: 32 ng/mL (ref >5.9)

## 2017-09-13 LAB — RETICULOCYTES
RBC.: 3.02 MIL/uL — AB (ref 3.87–5.11)
RETIC CT PCT: 4.3 % — AB (ref 0.4–3.1)
Retic Count, Absolute: 129.9 10*3/uL (ref 19.0–186.0)

## 2017-09-13 LAB — SODIUM
Sodium: 127 mmol/L — ABNORMAL LOW (ref 135–145)
Sodium: 126 mmol/L — ABNORMAL LOW (ref 135–145)

## 2017-09-13 LAB — FERRITIN: Ferritin: 342 ng/mL — ABNORMAL HIGH (ref 11–307)

## 2017-09-13 LAB — VITAMIN B12: Vitamin B-12: 949 pg/mL — ABNORMAL HIGH (ref 180–914)

## 2017-09-13 MED ORDER — ISOSORBIDE DINITRATE 30 MG PO TABS
30.0000 mg | ORAL_TABLET | Freq: Three times a day (TID) | ORAL | Status: DC
  Administered 2017-09-13 – 2017-09-17 (×13): 30 mg via ORAL
  Filled 2017-09-13 (×16): qty 1

## 2017-09-13 MED ORDER — FERROUS SULFATE 325 (65 FE) MG PO TABS
325.0000 mg | ORAL_TABLET | Freq: Two times a day (BID) | ORAL | Status: DC
  Administered 2017-09-13 – 2017-09-15 (×4): 325 mg via ORAL
  Filled 2017-09-13 (×4): qty 1

## 2017-09-13 MED ORDER — ISOSORBIDE DINITRATE 20 MG PO TABS
20.0000 mg | ORAL_TABLET | Freq: Three times a day (TID) | ORAL | Status: DC
  Filled 2017-09-13 (×2): qty 1

## 2017-09-13 NOTE — Progress Notes (Signed)
PROGRESS NOTE    Sarah Harmon  BCW:[id number] DOB: 1946/05/27 DOA: 09/09/2017 PCP: Biagio Borg, MD   Brief Narrative: 71 y/o F with PMH of cirrhosis (felt to be due to autoimmune with a possible component of alcohol), hepatocellular carcinoma status post treatment, hypertension was admitted on the 9/23 for acute onset of lethargy and slurred speech and acute encephalopathy.    Assessment & Plan:   Active Problems:   Anxiety state   Depression   Hyponatremia   Alcoholic cirrhosis of liver without ascites (HCC)   Bradycardia   Acute renal failure superimposed on stage 3 chronic kidney disease (HCC)   Cancer, hepatocellular (HCC)   Hyperkalemia   Acute metabolic encephalopathy   Elevated troponin   Slurred speech   Hypertensive crisis:  Unclear etiology.  Possibly non compliant to meds. Slight improvement. Increased isordil to 30 mg three times a day.    Elevated troponins, possibly demand ischemia from hypertensive emergency:  No chest pain.  She was started on IV heparin initially and later discontinued as her hemoglobin dropped. No obvious signs of bleeding.  She is being monitored off heparin.  She denies any complaints.    Acute kidney injury: baseline creatinine appears to be between 1.7 to 2.2.  Came in with creatinine of 3.2.  Nephrotoxins are discontinued.  Gentle hydration.  Renal ultrasound pending.  Creatinine at 2. 85  Repeat BMP IN AM.    Acute encephalopathy possibly from acute onset hyponatremia of unclear etiology, metabolic in nature.  improving.  Sodium is ranging from 126 to 127 today she remains asymptomatic. .     Liver cirrhosis:  Hepatocellular carcinoma:  Outpatient follow up .    Anemia:  Baseline around 12. Currently around 9. Anemia panel showed elevated ferritin of 342 and low iron levels. Ordered iron supplementation.  Monitor. f less than 7, will transfuse .       DVT prophylaxis: scd's Code Status: full code.    Family Communication: none at bedside, discussed the plan with the patient.  Disposition Plan: pending normal sodium .   Consultants:   None.   Procedures:none.  Antimicrobials:none.   Subjective: Denies any new complaints.   Objective: Vitals:   09/13/17 0500 09/13/17 0611 09/13/17 0804 09/13/17 1348  BP: (!) 186/72 (!) 177/74 (!) 189/76 (!) 179/79  Pulse: 97 94 94 89  Resp: 17  20 20   Temp: 99.8 F (37.7 C)     TempSrc: Oral     SpO2: 99%     Weight: 64.2 kg (141 lb 8.6 oz)     Height:       No intake or output data in the 24 hours ending 09/13/17 1621 Filed Weights   09/11/17 1527 09/12/17 0459 09/13/17 0500  Weight: 65 kg (143 lb 4.8 oz) 64.4 kg (141 lb 15.6 oz) 64.2 kg (141 lb 8.6 oz)    Examination:  General exam: Appears calm and comfortable, not in distress.  Respiratory system: Clear to auscultation. Respiratory effort normal. No wheezing or rhonchi.  Cardiovascular system: S1 & S2 heard, RRR. No JVD, murmurs, Gastrointestinal system: Abdomen is soft non tender non distended bowel sounds heard.  Central nervous system: Alert and oriented. No focal neurological deficits. Extremities: Symmetric 5 x 5 power. No cyanosis or clubbing.  Skin: No rashes, lesions or ulcers Psychiatry:  Mood & affect appropriate.     Data Reviewed: I have personally reviewed following labs and imaging studies  CBC:  Recent Labs Lab 09/09/17 2343  09/09/17 2352 09/10/17 3220 09/11/17 0321 09/12/17 0352 09/13/17 0507  WBC 11.4*  --  9.9 10.0 9.5 10.0  NEUTROABS 10.0*  --   --   --   --   --   HGB 11.2* 11.9* 11.4* 9.8* 8.8* 8.9*  HCT 31.5* 35.0* 32.0* 27.4* 25.2* 25.3*  MCV 82.5  --  82.5 83.0 83.2 83.8  PLT 169  --  128* 129* 162 254   Basic Metabolic Panel:  Recent Labs Lab 09/10/17 0852 09/10/17 1541 09/10/17 1754 09/10/17 2047 09/11/17 0801  09/12/17 0352 09/12/17 1349 09/12/17 2014 09/13/17 0507 09/13/17 1459  NA 115*  114* 119* 120* 121* 124*  < >  125* 126* 126* 127* 126*  K 5.9* 4.6 4.6 4.7 4.6  --  4.0  --   --   --   --   CL 89* 92* 93* 95* 99*  --  97*  --   --   --   --   CO2 11* 17* 18* 18* 18*  --  17*  --   --   --   --   GLUCOSE 102* 98 96 101* 96  --  91  --   --   --   --   BUN 45* 49* 51* 54* 61*  --  60*  --   --   --   --   CREATININE 3.10* 2.98* 2.98* 3.07* 2.81*  --  2.85*  --   --   --   --   CALCIUM 8.4* 8.3* 8.3* 8.4* 8.3*  --  8.3*  --   --   --   --   MG 1.8  --   --   --   --   --  1.8  --   --   --   --   PHOS 4.2  --   --   --   --   --   --   --   --   --   --   < > = values in this interval not displayed. GFR: Estimated Creatinine Clearance: 16.1 mL/min (A) (by C-G formula based on SCr of 2.85 mg/dL (H)). Liver Function Tests:  Recent Labs Lab 09/09/17 2343  AST 34  ALT 19  ALKPHOS 51  BILITOT 0.5  PROT 7.0  ALBUMIN 2.7*   No results for input(s): LIPASE, AMYLASE in the last 168 hours.  Recent Labs Lab 09/09/17 2343  AMMONIA <9*   Coagulation Profile:  Recent Labs Lab 09/09/17 2343  INR 1.11   Cardiac Enzymes:  Recent Labs Lab 09/10/17 0852 09/10/17 1541 09/10/17 2047  TROPONINI 0.16* 0.15* 0.13*   BNP (last 3 results) No results for input(s): PROBNP in the last 8760 hours. HbA1C: No results for input(s): HGBA1C in the last 72 hours. CBG:  Recent Labs Lab 09/10/17 0807 09/10/17 1613 09/10/17 2016  GLUCAP 127* 101* 103*   Lipid Profile: No results for input(s): CHOL, HDL, LDLCALC, TRIG, CHOLHDL, LDLDIRECT in the last 72 hours. Thyroid Function Tests: No results for input(s): TSH, T4TOTAL, FREET4, T3FREE, THYROIDAB in the last 72 hours. Anemia Panel:  Recent Labs  09/13/17 0507  VITAMINB12 949*  FOLATE 32.0  FERRITIN 342*  TIBC 286  IRON 19*  RETICCTPCT 4.3*   Sepsis Labs: No results for input(s): PROCALCITON, LATICACIDVEN in the last 168 hours.  Recent Results (from the past 240 hour(s))  MRSA PCR Screening     Status: None   Collection Time: 09/10/17  8:44 AM  Result Value Ref Range Status   MRSA by PCR NEGATIVE NEGATIVE Final    Comment:        The GeneXpert MRSA Assay (FDA approved for NASAL specimens only), is one component of a comprehensive MRSA colonization surveillance program. It is not intended to diagnose MRSA infection nor to guide or monitor treatment for MRSA infections.          Radiology Studies: No results found.      Scheduled Meds: . amLODipine  10 mg Oral Daily  . cholecalciferol  200 Units Oral q morning - 10a  . citalopram  10 mg Oral Daily  . feeding supplement (ENSURE ENLIVE)  237 mL Oral TID BM  . folic acid  1 mg Oral Daily  . hydrALAZINE  100 mg Oral TID  . isosorbide dinitrate  20 mg Oral TID  . mouth rinse  15 mL Mouth Rinse BID   Continuous Infusions: . sodium chloride 250 mL (09/11/17 1200)     LOS: 3 days    Time spent: 25 minutes.     Hosie Poisson, MD [facility] Pager [phone]  If 7PM-7AM, please contact night-coverage [email or web address] Password [email or web address] 09/13/2017, 4:21 PM

## 2017-09-13 NOTE — Consult Note (Signed)
           [facility] CM Primary Care Navigator  09/13/2017  Sarah Harmon 1946-04-05 [id number]   Seenpatient at the bedside to identify possibledischarge needs.  Patient reports having loose stools, weakness and dizziness at home. She presented with back pain, lethargy and slurring of speech which led to this admission.   Patient endorses Dr. Cathlean Cower with [facility] as the primary care provider.   [facility] [place] [facility].  Patient verbalized that daughter Sarah Harmon) manages her medications at home using "pill box" filled weekly.   She states that daughter provides transportation to her doctors' appointments.  Patient reports living with son Sarah Harmon) and daughter lives nearby. Patient states that daughter provides assistance with her care needs at home.    Discharge plan remains to be determined per MD pending normal sodium, therapy evaluation and recommendation.   Patient voiced understanding to call primary care provider's office when she returns back home for a post discharge follow-up appointment within a week or sooner if needs arise.Patient letter (with PCP's contact number) wasprovided as areminder.  Explained to patient [facility] CM services available for further health management but states unsure where she will be going from here.  She reports being able to manage at home so far.  Patient is aware and expressedunderstanding to seekreferral to [facility] care managementservicesfrom primary care provider ifdeemed necessary andappropriatefor services in the near future.   [facility] care management information provided for any future needs that she may have.  Primary care provider's office is listed as doing transition of care (TOC).   For questions, please contact:  Dannielle Huh, BSN, RN- [facility] Primary Care Navigator  Telephone: [phone] [place]

## 2017-09-13 NOTE — Progress Notes (Signed)
Patient given 10mg  IV hydralazine at 5:10 & 8:34FH for systolic B/P greater than 170, B/P 189/76 at 8am, contacted Dr Karleen Hampshire regarding B/P. Orders to give oral B/P medications now and if systolic B/P greater than 170 in 2 hours to administer IV hydralazine.

## 2017-09-13 NOTE — Evaluation (Signed)
Physical Therapy Evaluation Patient Details Name: Sarah Harmon MRN: [id number] DOB: 27-Feb-1946 Today's Date: 09/13/2017   History of Present Illness  Pt is a 71 y/o female admitted secondary to worsening SOB and slurred speech. Pt with alcoholic cirrhosis of the liver. PMH includes depression, anxiety, HTN, osteopenia, alcohol abuse, hepatocellular carcinoma, and laennec's cirrhosis.   Clinical Impression  Pt admitted secondary to problem above with deficits below. PTA, pt was independent with functional mobility. Upon eval, pt presenting with weakness, decreased balance, and slow cognitive processing. Required min A for steadying without use of AD, however, increased steadiness with use of RW. Required min guard for safety with use of RW. Oxygen sats from 97-99% on RA, slight SOB noted upon completion of gait. Recommending HHPT at d/c to increase independence and safety with functional mobility. Will continue to follow acutely to maximize functional mobility independence and safety.     Follow Up Recommendations Home health PT;Supervision/Assistance - 24 hour    Equipment Recommendations  None recommended by PT (has all necessary DME )    Recommendations for Other Services       Precautions / Restrictions Precautions Precautions: Fall Restrictions Weight Bearing Restrictions: No      Mobility  Bed Mobility Overal bed mobility: Modified Independent             General bed mobility comments: Increased time, however, no external assist required.   Transfers Overall transfer level: Needs assistance Equipment used: None;Rolling walker (2 wheeled) Transfers: Sit to/from Stand Sit to Stand: Min guard         General transfer comment: Min guard for steadying assist without AD. With use of RW required cues for safe hand placement.   Ambulation/Gait Ambulation/Gait assistance: Min guard;Min assist Ambulation Distance (Feet): 125 Feet Assistive device: Rolling walker (2  wheeled);None Gait Pattern/deviations: Step-through pattern;Decreased stride length;Drifts right/left Gait velocity: Decreased Gait velocity interpretation: Below normal speed for age/gender General Gait Details: Slow, unsteady gait without use of AD. Had LOB X 2 during ambulation to bathroom and required min A for steadying. Increased stability with use of RW and required min guard for safety. Educated to use RW at home to increase safety. Pt with some SOB upon completion of gait, however, oxygen sats from 97-99% on RA.   Stairs            Wheelchair Mobility    Modified Rankin (Stroke Patients Only)       Balance Overall balance assessment: Needs assistance Sitting-balance support: No upper extremity supported;Feet supported Sitting balance-Leahy Scale: Good     Standing balance support: Bilateral upper extremity supported;No upper extremity supported;During functional activity Standing balance-Leahy Scale: Fair Standing balance comment: Static standing without UE support                              Pertinent Vitals/Pain Pain Assessment: No/denies pain    Home Living Family/patient expects to be discharged to:: Private residence Living Arrangements: Children Available Help at Discharge: Family;Available 24 hours/day Type of Home: House Home Access: Stairs to enter Entrance Stairs-Rails: Right;Left;Can reach both Entrance Stairs-Number of Steps: 4 Home Layout: One level Home Equipment: Walker - 2 wheels;Shower seat      Prior Function Level of Independence: Independent               Hand Dominance        Extremity/Trunk Assessment   Upper Extremity Assessment Upper Extremity Assessment: Generalized weakness  Lower Extremity Assessment Lower Extremity Assessment: Generalized weakness    Cervical / Trunk Assessment Cervical / Trunk Assessment: Normal  Communication   Communication: No difficulties  Cognition Arousal/Alertness:  Awake/alert Behavior During Therapy: WFL for tasks assessed/performed Overall Cognitive Status: No family/caregiver present to determine baseline cognitive functioning                                 General Comments: Alert and oriented, however, demonstrates some slowed cognitive processing. Unsure if this is baseline for patient.       General Comments General comments (skin integrity, edema, etc.): Educated about need for HHPT and pt agreeable.     Exercises     Assessment/Plan    PT Assessment Patient needs continued PT services  PT Problem List Decreased strength;Decreased balance;Decreased mobility;Decreased cognition;Decreased knowledge of use of DME       PT Treatment Interventions DME instruction;Stair training;Gait training;Functional mobility training;Therapeutic activities;Balance training;Neuromuscular re-education;Therapeutic exercise;Patient/family education;Cognitive remediation    PT Goals (Current goals can be found in the Care Plan section)  Acute Rehab PT Goals Patient Stated Goal: to go home  PT Goal Formulation: With patient Time For Goal Achievement: 09/20/17 Potential to Achieve Goals: Good    Frequency Min 3X/week   Barriers to discharge        Co-evaluation               AM-PAC PT "6 Clicks" Daily Activity  Outcome Measure Difficulty turning over in bed (including adjusting bedclothes, sheets and blankets)?: None Difficulty moving from lying on back to sitting on the side of the bed? : None Difficulty sitting down on and standing up from a chair with arms (e.g., wheelchair, bedside commode, etc,.)?: Unable Help needed moving to and from a bed to chair (including a wheelchair)?: A Little Help needed walking in hospital room?: A Little Help needed climbing 3-5 steps with a railing? : A Little 6 Click Score: 18    End of Session Equipment Utilized During Treatment: Gait belt Activity Tolerance: Patient tolerated treatment  well Patient left: in bed;with call bell/phone within reach;with bed alarm set Nurse Communication: Mobility status PT Visit Diagnosis: Unsteadiness on feet (R26.81);Muscle weakness (generalized) (M62.81)    Time: 4580-9983 PT Time Calculation (min) (ACUTE ONLY): 18 min   Charges:   PT Evaluation $PT Eval Low Complexity: 1 Low     PT G Codes:        Leighton Ruff, PT, DPT  Acute Rehabilitation Services  Pager: [phone]   Rudean Hitt 09/13/2017, 12:55 PM

## 2017-09-14 LAB — CBC
HCT: 25 % — ABNORMAL LOW (ref 36.0–46.0)
HEMOGLOBIN: 8.6 g/dL — AB (ref 12.0–15.0)
MCH: 29.4 pg (ref 26.0–34.0)
MCHC: 34.4 g/dL (ref 30.0–36.0)
MCV: 85.3 fL (ref 78.0–100.0)
Platelets: 161 10*3/uL (ref 150–400)
RBC: 2.93 MIL/uL — AB (ref 3.87–5.11)
RDW: 14.6 % (ref 11.5–15.5)
WBC: 9 10*3/uL (ref 4.0–10.5)

## 2017-09-14 LAB — BASIC METABOLIC PANEL
Anion gap: 8 (ref 5–15)
Anion gap: 10 (ref 5–15)
BUN: 67 mg/dL — AB (ref 6–20)
BUN: 67 mg/dL — AB (ref 6–20)
CHLORIDE: 100 mmol/L — AB (ref 101–111)
CHLORIDE: 99 mmol/L — AB (ref 101–111)
CO2: 20 mmol/L — AB (ref 22–32)
CO2: 19 mmol/L — AB (ref 22–32)
CREATININE: 3.43 mg/dL — AB (ref 0.44–1.00)
Calcium: 8.6 mg/dL — ABNORMAL LOW (ref 8.9–10.3)
Calcium: 8.8 mg/dL — ABNORMAL LOW (ref 8.9–10.3)
Creatinine, Ser: 3.47 mg/dL — ABNORMAL HIGH (ref 0.44–1.00)
GFR calc Af Amer: 14 mL/min — ABNORMAL LOW (ref >60)
GFR calc Af Amer: 14 mL/min — ABNORMAL LOW (ref >60)
GFR calc non Af Amer: 12 mL/min — ABNORMAL LOW (ref >60)
GFR calc non Af Amer: 12 mL/min — ABNORMAL LOW (ref >60)
GLUCOSE: 157 mg/dL — AB (ref 65–99)
Glucose, Bld: 110 mg/dL — ABNORMAL HIGH (ref 65–99)
POTASSIUM: 4.8 mmol/L (ref 3.5–5.1)
POTASSIUM: 4.3 mmol/L (ref 3.5–5.1)
Sodium: 128 mmol/L — ABNORMAL LOW (ref 135–145)
Sodium: 128 mmol/L — ABNORMAL LOW (ref 135–145)

## 2017-09-14 MED ORDER — VITAMIN D 1000 UNITS PO TABS
1000.0000 [IU] | ORAL_TABLET | Freq: Every morning | ORAL | Status: DC
  Administered 2017-09-14 – 2017-09-17 (×4): 1000 [IU] via ORAL
  Filled 2017-09-14 (×4): qty 1

## 2017-09-14 MED ORDER — POLYETHYLENE GLYCOL 3350 17 G PO PACK
17.0000 g | PACK | Freq: Every day | ORAL | Status: DC
  Administered 2017-09-14: 17 g via ORAL
  Filled 2017-09-14 (×4): qty 1

## 2017-09-14 MED ORDER — CLONIDINE HCL 0.1 MG PO TABS
0.1000 mg | ORAL_TABLET | Freq: Every day | ORAL | Status: DC
  Administered 2017-09-14 – 2017-09-17 (×4): 0.1 mg via ORAL
  Filled 2017-09-14 (×4): qty 1

## 2017-09-14 MED ORDER — SENNOSIDES-DOCUSATE SODIUM 8.6-50 MG PO TABS
2.0000 | ORAL_TABLET | Freq: Two times a day (BID) | ORAL | Status: DC
  Administered 2017-09-14 – 2017-09-15 (×2): 2 via ORAL
  Filled 2017-09-14 (×7): qty 2

## 2017-09-14 NOTE — Progress Notes (Signed)
Per MD verbal order, the orders for US Renal Artery Duplex  and NPO after midnight were D/C. Will continue to monitor.

## 2017-09-14 NOTE — Progress Notes (Signed)
PROGRESS NOTE    Sarah Harmon  ION:[id number] DOB: Mar 08, 1946 DOA: 09/09/2017 PCP: Biagio Borg, MD   Brief Narrative: 71 y/o F with PMH of cirrhosis (felt to be due to autoimmune with a possible component of alcohol), hepatocellular carcinoma status post treatment, hypertension was admitted on the 9/23 for acute onset of lethargy and slurred speech and acute encephalopathy.    Assessment & Plan:   Active Problems:   Anxiety state   Depression   Hyponatremia   Alcoholic cirrhosis of liver without ascites (HCC)   Bradycardia   Acute renal failure superimposed on stage 3 chronic kidney disease (HCC)   Cancer, hepatocellular (HCC)   Hyperkalemia   Acute metabolic encephalopathy   Elevated troponin   Slurred speech   Hypertensive crisis:  Unclear etiology.  Possibly non compliant to meds. Slight improvement. Increased isordil to 30 mg three times a day.  Suboptimally controlled. Added clonidine.    Elevated troponins, possibly demand ischemia from hypertensive emergency:  No chest pain.  She was started on IV heparin initially and later discontinued as her hemoglobin dropped. No obvious signs of bleeding.  She is being monitored off heparin.  She denies any complaints.    Acute kidney injury: baseline creatinine appears to be between 1.7 to 2.2.  Came in with creatinine of 3.2.  Nephrotoxins are discontinued.  Gentle hydration.  Renal ultrasound pending.  Creatinine at 3.47 today.  Will get renal consult in am.    Acute encephalopathy possibly from acute onset hyponatremia of unclear etiology, metabolic in nature.  improving.  Sodium is 128 today. Asymptomatic.     Liver cirrhosis:  Hepatocellular carcinoma:  Outpatient follow up .    Anemia:  Baseline around 12. Currently around 9. Anemia panel showed elevated ferritin of 342 and low iron levels. Ordered iron supplementation.  Monitor. If  less than 7, will transfuse .       DVT prophylaxis:  scd's Code Status: full code.  Family Communication: none at bedside, discussed the plan with the patient.  Disposition Plan: pending normal sodium .   Consultants:   None.   Procedures:none.  Antimicrobials:none.   Subjective: Denies any new complaints. Wants to go home.   Objective: Vitals:   09/13/17 2204 09/14/17 0206 09/14/17 0530 09/14/17 1401  BP: (!) 173/66 (!) 165/66 (!) 167/73 (!) 165/60  Pulse: 96 98 94 91  Resp: 19 17 18 17   Temp: 98.9 F (37.2 C) 99 F (37.2 C) 98.5 F (36.9 C) 98.2 F (36.8 C)  TempSrc: Oral Oral Oral Oral  SpO2: 97% 95% 98% 98%  Weight:   64.5 kg (142 lb 3.2 oz)   Height:        Intake/Output Summary (Last 24 hours) at 09/14/17 1651 Last data filed at 09/14/17 1521  Gross per 24 hour  Intake              420 ml  Output              900 ml  Net             -480 ml   Filed Weights   09/12/17 0459 09/13/17 0500 09/14/17 0530  Weight: 64.4 kg (141 lb 15.6 oz) 64.2 kg (141 lb 8.6 oz) 64.5 kg (142 lb 3.2 oz)    Examination:  General exam: Appears calm and comfortable no distress.  Respiratory system: good air entry , bilateral , no wheezing or rhonchi.  Cardiovascular system: S1 & S2 heard, RRR.  No JVD, murmurs, Gastrointestinal system: Abdomen is soft NT ND BS+ Central nervous system: Alert and oriented. No focal neurological deficits. Extremities: Symmetric 5 x 5 power. No cyanosis or clubbing.  Skin: No rashes, lesions or ulcers Psychiatry:  Mood & affect appropriate.     Data Reviewed: I have personally reviewed following labs and imaging studies  CBC:  Recent Labs Lab 09/09/17 2343  09/10/17 0852 09/11/17 0321 09/12/17 0352 09/13/17 0507 09/14/17 0343  WBC 11.4*  --  9.9 10.0 9.5 10.0 9.0  NEUTROABS 10.0*  --   --   --   --   --   --   HGB 11.2*  < > 11.4* 9.8* 8.8* 8.9* 8.6*  HCT 31.5*  < > 32.0* 27.4* 25.2* 25.3* 25.0*  MCV 82.5  --  82.5 83.0 83.2 83.8 85.3  PLT 169  --  128* 129* 162 184 161  < > = values in  this interval not displayed. Basic Metabolic Panel:  Recent Labs Lab 09/10/17 0852  09/10/17 2047 09/11/17 0801  09/12/17 0352  09/12/17 2014 09/13/17 0507 09/13/17 1459 09/14/17 0343 09/14/17 1353  NA 115*  114*  < > 121* 124*  < > 125*  < > 126* 127* 126* 128* 128*  K 5.9*  < > 4.7 4.6  --  4.0  --   --   --   --  4.8 4.3  CL 89*  < > 95* 99*  --  97*  --   --   --   --  100* 99*  CO2 11*  < > 18* 18*  --  17*  --   --   --   --  20* 19*  GLUCOSE 102*  < > 101* 96  --  91  --   --   --   --  110* 157*  BUN 45*  < > 54* 61*  --  60*  --   --   --   --  67* 67*  CREATININE 3.10*  < > 3.07* 2.81*  --  2.85*  --   --   --   --  3.43* 3.47*  CALCIUM 8.4*  < > 8.4* 8.3*  --  8.3*  --   --   --   --  8.6* 8.8*  MG 1.8  --   --   --   --  1.8  --   --   --   --   --   --   PHOS 4.2  --   --   --   --   --   --   --   --   --   --   --   < > = values in this interval not displayed. GFR: Estimated Creatinine Clearance: 13.2 mL/min (A) (by C-G formula based on SCr of 3.47 mg/dL (H)). Liver Function Tests:  Recent Labs Lab 09/09/17 2343  AST 34  ALT 19  ALKPHOS 51  BILITOT 0.5  PROT 7.0  ALBUMIN 2.7*   No results for input(s): LIPASE, AMYLASE in the last 168 hours.  Recent Labs Lab 09/09/17 2343  AMMONIA <9*   Coagulation Profile:  Recent Labs Lab 09/09/17 2343  INR 1.11   Cardiac Enzymes:  Recent Labs Lab 09/10/17 0852 09/10/17 1541 09/10/17 2047  TROPONINI 0.16* 0.15* 0.13*   BNP (last 3 results) No results for input(s): PROBNP in the last 8760 hours. HbA1C: No results for input(s): HGBA1C in the last  72 hours. CBG:  Recent Labs Lab 09/10/17 0807 09/10/17 1613 09/10/17 2016  GLUCAP 127* 101* 103*   Lipid Profile: No results for input(s): CHOL, HDL, LDLCALC, TRIG, CHOLHDL, LDLDIRECT in the last 72 hours. Thyroid Function Tests: No results for input(s): TSH, T4TOTAL, FREET4, T3FREE, THYROIDAB in the last 72 hours. Anemia Panel:  Recent Labs   09/13/17 0507  VITAMINB12 949*  FOLATE 32.0  FERRITIN 342*  TIBC 286  IRON 19*  RETICCTPCT 4.3*   Sepsis Labs: No results for input(s): PROCALCITON, LATICACIDVEN in the last 168 hours.  Recent Results (from the past 240 hour(s))  MRSA PCR Screening     Status: None   Collection Time: 09/10/17  8:44 AM  Result Value Ref Range Status   MRSA by PCR NEGATIVE NEGATIVE Final    Comment:        The GeneXpert MRSA Assay (FDA approved for NASAL specimens only), is one component of a comprehensive MRSA colonization surveillance program. It is not intended to diagnose MRSA infection nor to guide or monitor treatment for MRSA infections.          Radiology Studies: No results found.      Scheduled Meds: . amLODipine  10 mg Oral Daily  . cholecalciferol  1,000 Units Oral q morning - 10a  . citalopram  10 mg Oral Daily  . feeding supplement (ENSURE ENLIVE)  237 mL Oral TID BM  . ferrous sulfate  325 mg Oral BID WC  . folic acid  1 mg Oral Daily  . hydrALAZINE  100 mg Oral TID  . isosorbide dinitrate  30 mg Oral TID  . mouth rinse  15 mL Mouth Rinse BID  . polyethylene glycol  17 g Oral Daily  . senna-docusate  2 tablet Oral BID   Continuous Infusions: . sodium chloride 250 mL (09/11/17 1200)     LOS: 4 days    Time spent: 25 minutes.     Hosie Poisson, MD [facility] Pager [phone]  If 7PM-7AM, please contact night-coverage [email or web address] Password [facility] 09/14/2017, 4:51 PM

## 2017-09-14 NOTE — Progress Notes (Signed)
Physical Therapy Treatment Patient Details Name: Sarah Harmon MRN: [id number] DOB: 09/20/1946 Today's Date: 09/14/2017    History of Present Illness Pt is a 71 y/o female admitted secondary to worsening SOB and slurred speech. Pt with alcoholic cirrhosis of the liver. PMH includes depression, anxiety, HTN, osteopenia, alcohol abuse, hepatocellular carcinoma, and laennec's cirrhosis.     PT Comments    Pt progressing towards goals. Increased ambulation distance and practiced stair navigation this session. Continues to require min guard to supervision for safety with mobility. Oxygen sats >90% throughout session. Current recommendations remain appropriate. Will continue to follow acutely to maximize functional mobility independence and safety.    Follow Up Recommendations  Home health PT;Supervision/Assistance - 24 hour     Equipment Recommendations  None recommended by PT    Recommendations for Other Services       Precautions / Restrictions Precautions Precautions: None Restrictions Weight Bearing Restrictions: No    Mobility  Bed Mobility Overal bed mobility: Modified Independent                Transfers Overall transfer level: Needs assistance Equipment used: Rolling walker (2 wheeled) Transfers: Sit to/from Stand Sit to Stand: Min guard         General transfer comment: Min guard for steadying assist. Verbal cues for safe hand placement with use of RW.   Ambulation/Gait Ambulation/Gait assistance: Min guard;Supervision Ambulation Distance (Feet): 200 Feet Assistive device: Rolling walker (2 wheeled);None Gait Pattern/deviations: Step-through pattern;Decreased stride length;Drifts right/left Gait velocity: Decreased Gait velocity interpretation: Below normal speed for age/gender General Gait Details: Increased steadiness noted. Continues to drift R and L during ambulation with RW. Min guard to close supervision for safety. Noted some SOB, however,  oxygen sats >90% on RA.    Stairs Stairs: Yes   Stair Management: Two rails;Step to pattern;Forwards Number of Stairs: 4 General stair comments: Min guard for safety. Educated about use of step to pattern for increased safety at home. Educated to have son with her for stair management as well.   Wheelchair Mobility    Modified Rankin (Stroke Patients Only)       Balance Overall balance assessment: Needs assistance Sitting-balance support: No upper extremity supported;Feet supported Sitting balance-Leahy Scale: Good     Standing balance support: Bilateral upper extremity supported;No upper extremity supported;During functional activity Standing balance-Leahy Scale: Fair Standing balance comment: Static standing without UE support                             Cognition Arousal/Alertness: Awake/alert Behavior During Therapy: WFL for tasks assessed/performed Overall Cognitive Status: No family/caregiver present to determine baseline cognitive functioning                                 General Comments: Continue to notice some slowed cognitive processing. Unsure if this is baseline.       Exercises      General Comments        Pertinent Vitals/Pain Pain Assessment: No/denies pain    Home Living                      Prior Function            PT Goals (current goals can now be found in the care plan section) Acute Rehab PT Goals Patient Stated Goal: to go home  PT Goal  Formulation: With patient Time For Goal Achievement: 09/20/17 Potential to Achieve Goals: Good Progress towards PT goals: Progressing toward goals    Frequency    Min 3X/week      PT Plan Current plan remains appropriate    Co-evaluation              AM-PAC PT "6 Clicks" Daily Activity  Outcome Measure  Difficulty turning over in bed (including adjusting bedclothes, sheets and blankets)?: None Difficulty moving from lying on back to sitting on  the side of the bed? : None Difficulty sitting down on and standing up from a chair with arms (e.g., wheelchair, bedside commode, etc,.)?: Unable Help needed moving to and from a bed to chair (including a wheelchair)?: A Little Help needed walking in hospital room?: A Little Help needed climbing 3-5 steps with a railing? : A Little 6 Click Score: 18    End of Session Equipment Utilized During Treatment: Gait belt Activity Tolerance: Patient tolerated treatment well Patient left: in chair;with call bell/phone within reach Nurse Communication: Mobility status PT Visit Diagnosis: Unsteadiness on feet (R26.81);Muscle weakness (generalized) (M62.81)     Time: 3299-2426 PT Time Calculation (min) (ACUTE ONLY): 24 min  Charges:  $Gait Training: 23-37 mins                    G Codes:       Leighton Ruff, PT, DPT  Acute Rehabilitation Services  Pager: [phone]    Rudean Hitt 09/14/2017, 12:27 PM

## 2017-09-14 NOTE — Care Management Important Message (Signed)
Important Message  Patient Details  Name: Sarah Harmon MRN: [id number] Date of Birth: 06/10/1946   Medicare Important Message Given:  Yes    Nathen May 09/14/2017, 3:36 PM

## 2017-09-15 LAB — BASIC METABOLIC PANEL
Anion gap: 11 (ref 5–15)
BUN: 67 mg/dL — AB (ref 6–20)
CO2: 19 mmol/L — ABNORMAL LOW (ref 22–32)
CREATININE: 3.33 mg/dL — AB (ref 0.44–1.00)
Calcium: 8.5 mg/dL — ABNORMAL LOW (ref 8.9–10.3)
Chloride: 96 mmol/L — ABNORMAL LOW (ref 101–111)
GFR calc Af Amer: 15 mL/min — ABNORMAL LOW (ref >60)
GFR calc non Af Amer: 13 mL/min — ABNORMAL LOW (ref >60)
Glucose, Bld: 102 mg/dL — ABNORMAL HIGH (ref 65–99)
Potassium: 4.6 mmol/L (ref 3.5–5.1)
SODIUM: 126 mmol/L — AB (ref 135–145)

## 2017-09-15 LAB — CBC
HCT: 22.3 % — ABNORMAL LOW (ref 36.0–46.0)
Hemoglobin: 7.5 g/dL — ABNORMAL LOW (ref 12.0–15.0)
MCH: 28.5 pg (ref 26.0–34.0)
MCHC: 33.6 g/dL (ref 30.0–36.0)
MCV: 84.8 fL (ref 78.0–100.0)
PLATELETS: 145 10*3/uL — AB (ref 150–400)
RBC: 2.63 MIL/uL — ABNORMAL LOW (ref 3.87–5.11)
RDW: 14.5 % (ref 11.5–15.5)
WBC: 7.4 10*3/uL (ref 4.0–10.5)

## 2017-09-15 MED ORDER — SODIUM CHLORIDE 0.9 % IV SOLN
250.0000 mg | Freq: Every day | INTRAVENOUS | Status: DC
  Administered 2017-09-15 – 2017-09-16 (×2): 250 mg via INTRAVENOUS
  Filled 2017-09-15 (×5): qty 20

## 2017-09-15 NOTE — Progress Notes (Signed)
PROGRESS NOTE    Sarah Harmon  JKK:[id number] DOB: [birth year] DOA: 09/09/2017 PCP: Biagio Borg, MD   Brief Narrative: 71 y/o F with PMH of cirrhosis (felt to be due to autoimmune with a possible component of alcohol), hepatocellular carcinoma status post treatment, hypertension was admitted on the 9/23 for acute onset of lethargy and slurred speech and acute encephalopathy.    Assessment & Plan:   Active Problems:   Anxiety state   Depression   Hyponatremia   Alcoholic cirrhosis of liver without ascites (HCC)   Bradycardia   Acute renal failure superimposed on stage 3 chronic kidney disease (HCC)   Cancer, hepatocellular (HCC)   Hyperkalemia   Acute metabolic encephalopathy   Elevated troponin   Slurred speech   Hypertensive crisis:  Unclear etiology.  Possibly non compliant to meds. Slight improvement. Increased isordil to 30 mg three times a day.  Suboptimally controlled. Added clonidine. Her pressures are much better today after starting clonidine, but unsure how much compliant she will be. Discussed with the family at bedside.    Elevated troponins, possibly demand ischemia from hypertensive emergency:  No chest pain.  She was started on IV heparin initially and later discontinued as her hemoglobin dropped. No obvious signs of bleeding.  She is being monitored off heparin.  She denies any complaints of chest pain.    Acute kidney injury: baseline creatinine appears to be between 1.7 to 2.2.  Came in with creatinine of 3.2.  Nephrotoxins are discontinued.  Renal ultrasound shows medical renal disease.  Creatinine at 3.3 today.  Renal consulted as she is close to HD.    Acute encephalopathy possibly from acute onset hyponatremia of unclear etiology, metabolic in nature.  improving.  Sodium is 126, start renal diet with fluid restriction. She appears to be  Asymptomatic.     Liver cirrhosis:  Hepatocellular carcinoma:  Outpatient follow up .    Anemia:   Baseline around 12.  Admitted with 9, and hemoglobin dropped to 7.2.  Iron deficiency on anemia panel. And will get stool for occult blood.        DVT prophylaxis: scd's Code Status: full code.  Family Communication: none at bedside, discussed the plan with the patient.  Disposition Plan: hoe in am when sodium improves  Consultants:   None.   Procedures:none.  Antimicrobials:none.   Subjective: No chest pain or sob. No nausea, vomiting.   Objective: Vitals:   09/15/17 0512 09/15/17 1017 09/15/17 1538 09/15/17 1556  BP: (!) 164/67 (!) 161/70 (!) 154/64 (!) 146/60  Pulse: 92   78  Resp:    19  Temp:    99.1 F (37.3 C)  TempSrc:    Oral  SpO2:    98%  Weight:      Height:        Intake/Output Summary (Last 24 hours) at 09/15/17 1720 Last data filed at 09/15/17 1555  Gross per 24 hour  Intake              120 ml  Output                0 ml  Net              120 ml   Filed Weights   09/13/17 0500 09/14/17 0530 09/15/17 0511  Weight: 64.2 kg (141 lb 8.6 oz) 64.5 kg (142 lb 3.2 oz) 63 kg (138 lb 14.2 oz)    Examination:  General exam: laying in bed  comfortable.  Respiratory system: air entry fair, no wheeizng.  Cardiovascular system: s1s2 RRR, no pedal edema.  Gastrointestinal system: Abdomen is soft non tender non distended bowel sounds heard.  Central nervous system: Alert and oriented. Non focal.  Extremities: Symmetric 5 x 5 power. No cyanosis or clubbing.  Skin: No rashes, lesions or ulcers Psychiatry:  Mood & affect appropriate.     Data Reviewed: I have personally reviewed following labs and imaging studies  CBC:  Recent Labs Lab 09/09/17 2343  09/11/17 0321 09/12/17 0352 09/13/17 0507 09/14/17 0343 09/15/17 0358  WBC 11.4*  < > 10.0 9.5 10.0 9.0 7.4  NEUTROABS 10.0*  --   --   --   --   --   --   HGB 11.2*  < > 9.8* 8.8* 8.9* 8.6* 7.5*  HCT 31.5*  < > 27.4* 25.2* 25.3* 25.0* 22.3*  MCV 82.5  < > 83.0 83.2 83.8 85.3 84.8  PLT 169  < >  129* 162 184 161 145*  < > = values in this interval not displayed. Basic Metabolic Panel:  Recent Labs Lab 09/10/17 0852  09/11/17 0801  09/12/17 0352  09/13/17 0507 09/13/17 1459 09/14/17 0343 09/14/17 1353 09/15/17 1007  NA 115*  114*  < > 124*  < > 125*  < > 127* 126* 128* 128* 126*  K 5.9*  < > 4.6  --  4.0  --   --   --  4.8 4.3 4.6  CL 89*  < > 99*  --  97*  --   --   --  100* 99* 96*  CO2 11*  < > 18*  --  17*  --   --   --  20* 19* 19*  GLUCOSE 102*  < > 96  --  91  --   --   --  110* 157* 102*  BUN 45*  < > 61*  --  60*  --   --   --  67* 67* 67*  CREATININE 3.10*  < > 2.81*  --  2.85*  --   --   --  3.43* 3.47* 3.33*  CALCIUM 8.4*  < > 8.3*  --  8.3*  --   --   --  8.6* 8.8* 8.5*  MG 1.8  --   --   --  1.8  --   --   --   --   --   --   PHOS 4.2  --   --   --   --   --   --   --   --   --   --   < > = values in this interval not displayed. GFR: Estimated Creatinine Clearance: 13.6 mL/min (A) (by C-G formula based on SCr of 3.33 mg/dL (H)). Liver Function Tests:  Recent Labs Lab 09/09/17 2343  AST 34  ALT 19  ALKPHOS 51  BILITOT 0.5  PROT 7.0  ALBUMIN 2.7*   No results for input(s): LIPASE, AMYLASE in the last 168 hours.  Recent Labs Lab 09/09/17 2343  AMMONIA <9*   Coagulation Profile:  Recent Labs Lab 09/09/17 2343  INR 1.11   Cardiac Enzymes:  Recent Labs Lab 09/10/17 0852 09/10/17 1541 09/10/17 2047  TROPONINI 0.16* 0.15* 0.13*   BNP (last 3 results) No results for input(s): PROBNP in the last 8760 hours. HbA1C: No results for input(s): HGBA1C in the last 72 hours. CBG:  Recent Labs Lab 09/10/17 [phone] 09/10/17 1613  09/10/17 2016  GLUCAP 127* 101* 103*   Lipid Profile: No results for input(s): CHOL, HDL, LDLCALC, TRIG, CHOLHDL, LDLDIRECT in the last 72 hours. Thyroid Function Tests: No results for input(s): TSH, T4TOTAL, FREET4, T3FREE, THYROIDAB in the last 72 hours. Anemia Panel:  Recent Labs  09/13/17 0507  VITAMINB12  949*  FOLATE 32.0  FERRITIN 342*  TIBC 286  IRON 19*  RETICCTPCT 4.3*   Sepsis Labs: No results for input(s): PROCALCITON, LATICACIDVEN in the last 168 hours.  Recent Results (from the past 240 hour(s))  MRSA PCR Screening     Status: None   Collection Time: 09/10/17  8:44 AM  Result Value Ref Range Status   MRSA by PCR NEGATIVE NEGATIVE Final    Comment:        The GeneXpert MRSA Assay (FDA approved for NASAL specimens only), is one component of a comprehensive MRSA colonization surveillance program. It is not intended to diagnose MRSA infection nor to guide or monitor treatment for MRSA infections.          Radiology Studies: No results found.      Scheduled Meds: . amLODipine  10 mg Oral Daily  . cholecalciferol  1,000 Units Oral q morning - 10a  . citalopram  10 mg Oral Daily  . cloNIDine  0.1 mg Oral Daily  . feeding supplement (ENSURE ENLIVE)  237 mL Oral TID BM  . folic acid  1 mg Oral Daily  . hydrALAZINE  100 mg Oral TID  . isosorbide dinitrate  30 mg Oral TID  . mouth rinse  15 mL Mouth Rinse BID  . polyethylene glycol  17 g Oral Daily  . senna-docusate  2 tablet Oral BID   Continuous Infusions: . sodium chloride 250 mL (09/11/17 1200)  . ferric gluconate (FERRLECIT/NULECIT) IV 250 mg (09/15/17 1538)     LOS: 5 days    Time spent: 25 minutes.     Hosie Poisson, MD [facility] Pager [phone]  If 7PM-7AM, please contact night-coverage [email or web address] Password [email or web address] 09/15/2017, 5:20 PM

## 2017-09-15 NOTE — Consult Note (Signed)
[facility] Renal Consultation Note  Requesting MD: Blake Divine Indication for Consultation: A on CRF   HPI: is Sarah Harmon is a 71 y.o. 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  Creatinine noted to be 1.9 in May 2018.  She presents to the hospital on 9/24 complaining of back pain, lethargy and slurred speech. She had recently been started on propranolol. She was found to be bradycardic but also hyponatremic with a sodium of 110.  Creatinine was 3.3 where it had been 2.3 about a week before. She was treated with 3% saline. Sodium improved from 110-124 over 36 hours. Her mental status improved. Propranolol was held. Renal function improved over the first couple of days to 2.8, but then went up again to around 3.5- is 3.3 today and that is the reason for consultation.  Urine output not really well recorded, at least 1500 the last 2 days.  Urinalysis on 9/24 showed greater than 300 protein, but no cells.  Blood pressure if anything is been high the last few days, 161-190 systolic.  Renal ultrasound shows kidney sizes of 9 and 11 cm with increased echogenicity and no Hydro.  I do not detect any nephrotoxins. Patient says that she's been hypertensive for years and sounds as if it's not been under good control- she denies history of kidney stones or NSAIDs ( however, ibuprofen was noted on her preadmission meds) .  She feels much better than when she came in the hospital and is wondering when she can go home  Creatinine  Date/Time Value Ref Range Status  04/30/2017 09:29 AM 1.9 (H) 0.6 - 1.1 mg/dL Final  40/39/7953 69:22 AM 1.3 (H) 0.6 - 1.1 mg/dL Final  30/08/7948 97:18 AM 1.3 (H) 0.6 - 1.1 mg/dL Final   Creat  Date/Time Value Ref Range Status  09/02/2016 08:38 AM 1.64 (H) 0.60 - 0.93 mg/dL Final    Comment:      For patients  > or = 72 years of age: The upper reference limit for Creatinine is approximately 13% higher for people identified as African-American.     01/15/2016 08:20 AM 1.24 (H) 0.60 - 0.93 mg/dL Final  20/99/0689 34:06 AM 1.43 (H) 0.50 - 1.10 mg/dL Final   Creatinine, Ser  Date/Time Value Ref Range Status  09/15/2017 10:07 AM 3.33 (H) 0.44 - 1.00 mg/dL Final  84/02/3532 17:40 PM 3.47 (H) 0.44 - 1.00 mg/dL Final  99/27/8004 47:15 AM 3.43 (H) 0.44 - 1.00 mg/dL Final  80/63/8685 48:83 AM 2.85 (H) 0.44 - 1.00 mg/dL Final  01/41/5973 31:25 AM 2.81 (H) 0.44 - 1.00 mg/dL Final  08/71/9941 29:04 PM 3.07 (H) 0.44 - 1.00 mg/dL Final  75/33/9179 21:78 PM 2.98 (H) 0.44 - 1.00 mg/dL Final  37/54/2370 23:01 PM 2.98 (H) 0.44 - 1.00 mg/dL Final  72/08/1067 16:61 AM 3.10 (H) 0.44 - 1.00 mg/dL Final  96/94/0982 86:75 PM 3.30 (H) 0.44 - 1.00 mg/dL Final  19/82/4299 80:69 PM 3.27 (H) 0.44 - 1.00 mg/dL Final  99/67/2277 37:50 AM 2.28 (H) 0.40 - 1.20 mg/dL Final  51/06/1251 47:99 AM 1.72 (H) 0.40 - 1.20 mg/dL Final  80/12/2391 59:40 AM 1.40 (H) 0.40 - 1.20 mg/dL Final  90/50/2561 54:88 PM 1.20 0.40 - 1.20 mg/dL Final  45/73/3448 30:15 AM 1.23 (H) 0.44 - 1.00 mg/dL Final  99/68/9570 22:02 AM 1.46 (H) 0.44 - 1.00 mg/dL Final  03/09/2015 11:55 AM 1.69 (H) 0.40 - 1.20 mg/dL Final  12/68/6485 98:48 AM 1.16 (H) 0.50 - 1.10 mg/dL Final  85/84/1867 29:26 AM 1.35 (H) 0.50 - 1.10 mg/dL Final  72/82/0541 09:27 AM 1.56 (H) 0.50 - 1.10 mg/dL Final  33/09/7880 17:93 PM 1.93 (H) 0.50 - 1.10 mg/dL Final  04/17/5990 48:48 AM 1.56 (H) 0.50 - 1.10 mg/dL Final  03/07/6162 28:28 AM 1.40 (H) 0.50 - 1.10 mg/dL Final  96/09/3510 75:30 AM 1.37 (H) 0.50 - 1.10 mg/dL Final  74/13/2252 91:63 AM 1.38 (H) 0.50 - 1.10 mg/dL Final  69/38/0296 86:95 AM 1.30 (H) 0.50 - 1.10 mg/dL Final  79/23/8250 92:88 AM 1.26 (H) 0.50 - 1.10 mg/dL Final  01/32/7205 08:46 AM 1.02 0.50 - 1.10 mg/dL Final  17/82/0938 86:38 PM 1.20 (H) 0.50 - 1.10 mg/dL Final   15/16/5571 32:27 AM 1.17 0.40 - 1.20 mg/dL Final  38/92/6018 15:19 AM 1.1 0.4 - 1.2 mg/dL Final  03/31/3150 12:84 AM 1.1 0.4 - 1.2 mg/dL Final  99/93/3005 65:43 AM 1.2 0.4 - 1.2 mg/dL Final  39/47/9848 21:47 AM 1.3 (H) 0.4 - 1.2 mg/dL Final  72/67/7137 07:40 AM 1.4 (H) 0.4 - 1.2 mg/dL Final  88/93/4481 17:56 AM 1.3 (H) 0.4 - 1.2 mg/dL Final  74/80/5536 12:55 AM 0.8 0.4 - 1.2 mg/dL Final  57/83/0528 18:85 AM 0.70 0.4 - 1.2 mg/dL Final  77/10/788 17:63 AM 0.70 0.4 - 1.2 mg/dL Final  93/98/9341 49:21 AM 0.64 0.4 - 1.2 mg/dL Final  88/63/6110 38:51 AM 0.66 0.4 - 1.2 mg/dL Final  14/68/5557 37:17 AM 0.66 0.4 - 1.2 mg/dL Final  30/70/3377 80:78 PM 0.67 0.4 - 1.2 mg/dL Final  12/78/1086 07:63 AM 0.74 0.4 - 1.2 mg/dL Final  77/64/9042 50:53 PM 0.94 0.4 - 1.2 mg/dL Final  94/98/9910 68:59 AM 0.6 0.4 - 1.2 mg/dL Final  17/21/4619 68:91 AM 0.88  Final  02/04/2008 05:25 AM 1.05  Final  02/03/2008 10:45 AM 0.79  Final  02/02/2008 09:09 PM 0.9  Final  08/09/2007 03:20 AM 0.65  Final     PMHx:   Past Medical History:  Diagnosis Date  . ALCOHOL ABUSE 07/13/2010  . ANXIETY 12/22/2009  . DEPRESSION 12/22/2009  . 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 DJD (degenerative joint disease), cervical    patient denies on preop of 05/10/15   . FATIGUE 12/23/2010  . GALLSTONES 12/22/2009  . Gallstones   . Headache    occasional   . HYPERLIPIDEMIA 12/22/2009  . HYPERTENSION 12/22/2009  . HYPONATREMIA 12/22/2009  . 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 OSTEOPENIA 12/22/2009  . Pancytopenia 07/13/2010  . RENAL CYST, LEFT 12/22/2009  . VITAMIN D DEFICIENCY 12/23/2010  . WEIGHT LOSS 12/23/2010    Past Surgical History:  Procedure Laterality Date  . ABDOMINAL HYSTERECTOMY    . IR GENERIC HISTORICAL  10/18/2016   IR RADIOLOGIST EVAL & MGMT 10/18/2016 Irish Lack, MD [facility] INTERV RAD  . IR RADIOLOGIST EVAL & MGMT  05/30/2017    Family Hx:  Family History  Problem Relation Age of Onset  . Lung cancer Brother    . Breast cancer Other   . Breast cancer Sister   . Gastric cancer Sister   . Lung cancer Sister   . Colon cancer Neg Hx   . Colon polyps Neg Hx   . Rectal cancer Neg Hx   . Stomach cancer Neg Hx   . Esophageal cancer Neg Hx     Social History:  reports that she has never  smoked. She has never used smokeless tobacco. She reports that she drinks about 1.8 - 2.4 oz of alcohol per week . She reports that she does not use drugs.  Allergies: No Known Allergies  Medications: Prior to Admission medications   Medication Sig Start Date End Date Taking? Authorizing Provider  ALPRAZolam Duanne Moron) 0.5 MG tablet TAKE ONE TABLET BY MOUTH AT BEDTIME 05/24/17  Yes Biagio Borg, MD  amLODipine (NORVASC) 10 MG tablet Take 1 tablet (10 mg total) by mouth daily. 05/02/17  Yes Biagio Borg, MD  Cholecalciferol (VITAMIN D3) 1000 UNITS CAPS Take 200 Units by mouth every morning.    Yes [provider]  citalopram (CELEXA) 20 MG tablet TAKE ONE-HALF TABLET BY MOUTH ONCE DAILY 12/26/16  Yes Biagio Borg, MD  folic acid (FOLVITE) 1 MG tablet TAKE ONE TABLET BY MOUTH ONCE DAILY 06/28/17  Yes Biagio Borg, MD  hydrALAZINE (APRESOLINE) 100 MG tablet TAKE ONE TABLET BY MOUTH THREE TIMES DAILY 06/04/17  Yes Biagio Borg, MD  ibuprofen (ADVIL,MOTRIN) 200 MG tablet Take 1-2 tablets (200-400 mg total) by mouth every 6 (six) hours as needed for fever, headache, mild pain or moderate pain. 03/07/15  Yes Cherene Altes, MD  isosorbide dinitrate (ISORDIL) 10 MG tablet TAKE ONE TABLET BY MOUTH THREE TIMES DAILY WITH FOOD 06/04/17  Yes Biagio Borg, MD  lactulose (CHRONULAC) 10 GM/15ML solution Take 30 mLs (20 g total) by mouth daily. 02/28/17  Yes Biagio Borg, MD  propranolol (INDERAL) 40 MG tablet Take 1 tablet (40 mg total) by mouth 2 (two) times daily. 09/04/17  Yes Biagio Borg, MD    I have reviewed the patient's current medications.  Labs:  Results for orders placed or performed during the hospital  encounter of 09/09/17 (from the past 48 hour(s))  Sodium     Status: Abnormal   Collection Time: 09/13/17  2:59 PM  Result Value Ref Range   Sodium 126 (L) 135 - 145 mmol/L  CBC     Status: Abnormal   Collection Time: 09/14/17  3:43 AM  Result Value Ref Range   WBC 9.0 4.0 - 10.5 K/uL   RBC 2.93 (L) 3.87 - 5.11 MIL/uL   Hemoglobin 8.6 (L) 12.0 - 15.0 g/dL   HCT 25.0 (L) 36.0 - 46.0 %   MCV 85.3 78.0 - 100.0 fL   MCH 29.4 26.0 - 34.0 pg   MCHC 34.4 30.0 - 36.0 g/dL   RDW 14.6 11.5 - 15.5 %   Platelets 161 150 - 400 K/uL  Basic metabolic panel     Status: Abnormal   Collection Time: 09/14/17  3:43 AM  Result Value Ref Range   Sodium 128 (L) 135 - 145 mmol/L   Potassium 4.8 3.5 - 5.1 mmol/L   Chloride 100 (L) 101 - 111 mmol/L   CO2 20 (L) 22 - 32 mmol/L   Glucose, Bld 110 (H) 65 - 99 mg/dL   BUN 67 (H) 6 - 20 mg/dL   Creatinine, Ser 3.43 (H) 0.44 - 1.00 mg/dL   Calcium 8.6 (L) 8.9 - 10.3 mg/dL   GFR calc non Af Amer 12 (L) >60 mL/min   GFR calc Af Amer 14 (L) >60 mL/min    Comment: (NOTE) The eGFR has been calculated using the CKD EPI equation. This calculation has not been validated in all clinical situations. eGFR's persistently <60 mL/min signify possible Chronic Kidney Disease.    Anion gap 8 5 - 15  Basic metabolic panel     Status: Abnormal   Collection Time: 09/14/17  1:53 PM  Result Value Ref Range   Sodium 128 (L) 135 - 145 mmol/L   Potassium 4.3 3.5 - 5.1 mmol/L   Chloride 99 (L) 101 - 111 mmol/L   CO2 19 (L) 22 - 32 mmol/L   Glucose, Bld 157 (H) 65 - 99 mg/dL   BUN 67 (H) 6 - 20 mg/dL   Creatinine, Ser 3.47 (H) 0.44 - 1.00 mg/dL   Calcium 8.8 (L) 8.9 - 10.3 mg/dL   GFR calc non Af Amer 12 (L) >60 mL/min   GFR calc Af Amer 14 (L) >60 mL/min    Comment: (NOTE) The eGFR has been calculated using the CKD EPI equation. This calculation has not been validated in all clinical situations. eGFR's persistently <60 mL/min signify possible Chronic Kidney Disease.     Anion gap 10 5 - 15  CBC     Status: Abnormal   Collection Time: 09/15/17  3:58 AM  Result Value Ref Range   WBC 7.4 4.0 - 10.5 K/uL   RBC 2.63 (L) 3.87 - 5.11 MIL/uL   Hemoglobin 7.5 (L) 12.0 - 15.0 g/dL   HCT 22.3 (L) 36.0 - 46.0 %   MCV 84.8 78.0 - 100.0 fL   MCH 28.5 26.0 - 34.0 pg   MCHC 33.6 30.0 - 36.0 g/dL   RDW 14.5 11.5 - 15.5 %   Platelets 145 (L) 150 - 400 K/uL  Basic metabolic panel     Status: Abnormal   Collection Time: 09/15/17 10:07 AM  Result Value Ref Range   Sodium 126 (L) 135 - 145 mmol/L   Potassium 4.6 3.5 - 5.1 mmol/L   Chloride 96 (L) 101 - 111 mmol/L   CO2 19 (L) 22 - 32 mmol/L   Glucose, Bld 102 (H) 65 - 99 mg/dL   BUN 67 (H) 6 - 20 mg/dL   Creatinine, Ser 3.33 (H) 0.44 - 1.00 mg/dL   Calcium 8.5 (L) 8.9 - 10.3 mg/dL   GFR calc non Af Amer 13 (L) >60 mL/min   GFR calc Af Amer 15 (L) >60 mL/min    Comment: (NOTE) The eGFR has been calculated using the CKD EPI equation. This calculation has not been validated in all clinical situations. eGFR's persistently <60 mL/min signify possible Chronic Kidney Disease.    Anion gap 11 5 - 15     ROS:  A comprehensive review of systems was negative.  She is actually wondering when she can go home  Physical Exam: Vitals:   09/15/17 0512 09/15/17 1017  BP: (!) 164/67 (!) 161/70  Pulse: 92   Resp:    Temp:    SpO2:       General: alert, lying in bed. Simple but in no acute distress HEENT: pupils are equal round reactive to light, extra motions are intact, mucous membranes are moist Neck: no JVD Heart: regular rate and rhythm Lungs: mostly clear Abdomen: soft, nontender Extremities: no significant peripheral edema Skin: warm and dry Neuro: alert, nonfocal  Assessment/Plan: 71 year old black female with 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 She has long-standing hypertension and proteinuria 1.Renal- progressive CK D over the last 2 years. Greater than 300 protein noted on  urinalysis.  Long-standing history of poorly controlled hypertension.she certainly could have hypertensive related kidney disease. In addition, she has cirrhosis but doesn't appear to have the hemodynamics associated with hepatorenal syndrome. I do not appreciate  any nephrotoxins on her current medication list.  Her renal ultrasound shows evidence of chronic kidney disease. I'm not sure if there will be much for reversibility here.  I hope that and she gets over this acute illness her kidney function may settle out a little bit better.  She agrees to follow with me as an outpatient 2. Hypertension/volume  - sounds as if long-standing poorly controlled hypertension. Right now, on Norvasc 10 daily, hydralazine 100  3 times a day, clonidine once daily and Isordil. Not sure she will be compliant with the clonidine. I also not sure she will take the hydralazine 3 times per day. She will need more fine-tuning as an outpatient 3. Anemia  - is anemic and iron deficient. I will replete iron and she probably needs to be started on an ESA as well. This could be done in the outpatient setting. 4. Hyponatremia- probably associated with her cirrhosis. Has trended up this hospitalization  Just will need fluid restriction as an outpatient   Herley Bernardini A 09/15/2017, 2:30 PM

## 2017-09-16 LAB — RENAL FUNCTION PANEL
ALBUMIN: 2.1 g/dL — AB (ref 3.5–5.0)
Anion gap: 11 (ref 5–15)
BUN: 72 mg/dL — AB (ref 6–20)
CO2: 21 mmol/L — ABNORMAL LOW (ref 22–32)
Calcium: 8.2 mg/dL — ABNORMAL LOW (ref 8.9–10.3)
Chloride: 94 mmol/L — ABNORMAL LOW (ref 101–111)
Creatinine, Ser: 3.34 mg/dL — ABNORMAL HIGH (ref 0.44–1.00)
GFR calc Af Amer: 15 mL/min — ABNORMAL LOW (ref >60)
GFR calc non Af Amer: 13 mL/min — ABNORMAL LOW (ref >60)
GLUCOSE: 102 mg/dL — AB (ref 65–99)
PHOSPHORUS: 3.6 mg/dL (ref 2.5–4.6)
POTASSIUM: 4.5 mmol/L (ref 3.5–5.1)
SODIUM: 126 mmol/L — AB (ref 135–145)

## 2017-09-16 LAB — CBC
HEMATOCRIT: 24.3 % — AB (ref 36.0–46.0)
Hemoglobin: 8.2 g/dL — ABNORMAL LOW (ref 12.0–15.0)
MCH: 28.7 pg (ref 26.0–34.0)
MCHC: 33.7 g/dL (ref 30.0–36.0)
MCV: 85 fL (ref 78.0–100.0)
Platelets: 178 10*3/uL (ref 150–400)
RBC: 2.86 MIL/uL — ABNORMAL LOW (ref 3.87–5.11)
RDW: 14.3 % (ref 11.5–15.5)
WBC: 9 10*3/uL (ref 4.0–10.5)

## 2017-09-16 LAB — BASIC METABOLIC PANEL
Anion gap: 14 (ref 5–15)
BUN: 68 mg/dL — AB (ref 6–20)
CO2: 14 mmol/L — ABNORMAL LOW (ref 22–32)
Calcium: 8 mg/dL — ABNORMAL LOW (ref 8.9–10.3)
Chloride: 94 mmol/L — ABNORMAL LOW (ref 101–111)
Creatinine, Ser: 3.44 mg/dL — ABNORMAL HIGH (ref 0.44–1.00)
GFR calc Af Amer: 14 mL/min — ABNORMAL LOW (ref >60)
GFR, EST NON AFRICAN AMERICAN: 12 mL/min — AB (ref >60)
GLUCOSE: 123 mg/dL — AB (ref 65–99)
POTASSIUM: 4.6 mmol/L (ref 3.5–5.1)
Sodium: 122 mmol/L — ABNORMAL LOW (ref 135–145)

## 2017-09-16 MED ORDER — DARBEPOETIN ALFA 100 MCG/0.5ML IJ SOSY
100.0000 ug | PREFILLED_SYRINGE | INTRAMUSCULAR | Status: DC
  Administered 2017-09-16: 100 ug via SUBCUTANEOUS
  Filled 2017-09-16 (×2): qty 0.5

## 2017-09-16 MED ORDER — FUROSEMIDE 40 MG PO TABS
40.0000 mg | ORAL_TABLET | Freq: Every day | ORAL | Status: DC
  Administered 2017-09-16 – 2017-09-17 (×2): 40 mg via ORAL
  Filled 2017-09-16 (×2): qty 1

## 2017-09-16 NOTE — Progress Notes (Signed)
PROGRESS NOTE    Sarah Harmon  ZOX:[id number] DOB: 25-Mar-1946 DOA: 09/09/2017 PCP: Biagio Borg, MD   Brief Narrative: 71 y/o F with PMH of cirrhosis (felt to be due to autoimmune with a possible component of alcohol), hepatocellular carcinoma status post treatment, hypertension was admitted on the 9/23 for acute onset of lethargy and slurred speech and acute encephalopathy.    Assessment & Plan:   Active Problems:   Anxiety state   Depression   Hyponatremia   Alcoholic cirrhosis of liver without ascites (HCC)   Bradycardia   Acute renal failure superimposed on stage 3 chronic kidney disease (HCC)   Cancer, hepatocellular (HCC)   Hyperkalemia   Acute metabolic encephalopathy   Elevated troponin   Slurred speech   Hypertensive crisis:  Unclear etiology.  Possibly non compliant to meds.Increased isordil to 30 mg three times a day and added clonidine.  Her BP parameters are much better.    Elevated troponins, possibly demand ischemia from hypertensive emergency:  No chest pain.  She was started on IV heparin initially and later discontinued as her hemoglobin dropped. No obvious signs of bleeding.  She is being monitored off heparin.  She denies any complaints of chest pain.    Acute kidney injury: baseline creatinine appears to be between 1.7 to 2.2.  Came in with creatinine of 3.2.  Nephrotoxins are discontinued.  Renal ultrasound shows medical renal disease.  Creatinine at 3.4 today.  Renal consulted as she is close to HD.    Acute encephalopathy possibly from acute onset hyponatremia of unclear etiology, metabolic in nature.  improving.  Sodium is 122 today start renal diet with fluid restriction. She appears to be  Asymptomatic.  Lasix started by nephrology.     Liver cirrhosis:  Hepatocellular carcinoma:  Outpatient follow up .    Anemia:  Baseline around 12.  Admitted with 9, and hemoglobin dropped to 8.2  Iron deficiency on anemia panel. And will  get stool for occult blood.        DVT prophylaxis: scd's Code Status: full code.  Family Communication: none at bedside, discussed the plan with the patient.  Disposition Plan: home in am when sodium improves  Consultants:   None.   Procedures:none.  Antimicrobials:none.   Subjective: No chest pain or sob. No nausea, vomiting. No new complaints.   Objective: Vitals:   09/15/17 2050 09/16/17 0514 09/16/17 1035 09/16/17 1608  BP: (!) 150/69 (!) 160/66 (!) 156/68 (!) 148/56  Pulse: 82 93 81 88  Resp: 19 19  16   Temp: 98.3 F (36.8 C) 98.9 F (37.2 C)  99.1 F (37.3 C)  TempSrc: Oral Oral  Oral  SpO2: 94% 92%  99%  Weight:  62.6 kg (138 lb 0.1 oz)    Height:        Intake/Output Summary (Last 24 hours) at 09/16/17 1634 Last data filed at 09/16/17 1500  Gross per 24 hour  Intake             1077 ml  Output                0 ml  Net             1077 ml   Filed Weights   09/14/17 0530 09/15/17 0511 09/16/17 0514  Weight: 64.5 kg (142 lb 3.2 oz) 63 kg (138 lb 14.2 oz) 62.6 kg (138 lb 0.1 oz)    Examination: unchanged from yesterday.   General exam: laying in  bed comfortable.  Respiratory system: air entry fair, no wheeizng.  Cardiovascular system: s1s2 RRR, no pedal edema.  Gastrointestinal system: Abdomen is soft non tender non distended bowel sounds heard.  Central nervous system: Alert and oriented. Non focal.  Extremities: Symmetric 5 x 5 power. No cyanosis or clubbing.  Skin: No rashes, lesions or ulcers Psychiatry:  Mood & affect appropriate.     Data Reviewed: I have personally reviewed following labs and imaging studies  CBC:  Recent Labs Lab 09/09/17 2343  09/12/17 0352 09/13/17 0507 09/14/17 0343 09/15/17 0358 09/16/17 0525  WBC 11.4*  < > 9.5 10.0 9.0 7.4 9.0  NEUTROABS 10.0*  --   --   --   --   --   --   HGB 11.2*  < > 8.8* 8.9* 8.6* 7.5* 8.2*  HCT 31.5*  < > 25.2* 25.3* 25.0* 22.3* 24.3*  MCV 82.5  < > 83.2 83.8 85.3 84.8 85.0  PLT  169  < > 162 184 161 145* 178  < > = values in this interval not displayed. Basic Metabolic Panel:  Recent Labs Lab 09/10/17 0852  09/12/17 0352  09/14/17 0343 09/14/17 1353 09/15/17 1007 09/16/17 0525 09/16/17 0929  NA 115*  114*  < > 125*  < > 128* 128* 126* 126* 122*  K 5.9*  < > 4.0  --  4.8 4.3 4.6 4.5 4.6  CL 89*  < > 97*  --  100* 99* 96* 94* 94*  CO2 11*  < > 17*  --  20* 19* 19* 21* 14*  GLUCOSE 102*  < > 91  --  110* 157* 102* 102* 123*  BUN 45*  < > 60*  --  67* 67* 67* 72* 68*  CREATININE 3.10*  < > 2.85*  --  3.43* 3.47* 3.33* 3.34* 3.44*  CALCIUM 8.4*  < > 8.3*  --  8.6* 8.8* 8.5* 8.2* 8.0*  MG 1.8  --  1.8  --   --   --   --   --   --   PHOS 4.2  --   --   --   --   --   --  3.6  --   < > = values in this interval not displayed. GFR: Estimated Creatinine Clearance: 12.2 mL/min (A) (by C-G formula based on SCr of 3.44 mg/dL (H)). Liver Function Tests:  Recent Labs Lab 09/09/17 2343 09/16/17 0525  AST 34  --   ALT 19  --   ALKPHOS 51  --   BILITOT 0.5  --   PROT 7.0  --   ALBUMIN 2.7* 2.1*   No results for input(s): LIPASE, AMYLASE in the last 168 hours.  Recent Labs Lab 09/09/17 2343  AMMONIA <9*   Coagulation Profile:  Recent Labs Lab 09/09/17 2343  INR 1.11   Cardiac Enzymes:  Recent Labs Lab 09/10/17 0852 09/10/17 1541 09/10/17 2047  TROPONINI 0.16* 0.15* 0.13*   BNP (last 3 results) No results for input(s): PROBNP in the last 8760 hours. HbA1C: No results for input(s): HGBA1C in the last 72 hours. CBG:  Recent Labs Lab 09/10/17 0807 09/10/17 1613 09/10/17 2016  GLUCAP 127* 101* 103*   Lipid Profile: No results for input(s): CHOL, HDL, LDLCALC, TRIG, CHOLHDL, LDLDIRECT in the last 72 hours. Thyroid Function Tests: No results for input(s): TSH, T4TOTAL, FREET4, T3FREE, THYROIDAB in the last 72 hours. Anemia Panel: No results for input(s): VITAMINB12, FOLATE, FERRITIN, TIBC, IRON, RETICCTPCT in the last  72 hours. Sepsis  Labs: No results for input(s): PROCALCITON, LATICACIDVEN in the last 168 hours.  Recent Results (from the past 240 hour(s))  MRSA PCR Screening     Status: None   Collection Time: 09/10/17  8:44 AM  Result Value Ref Range Status   MRSA by PCR NEGATIVE NEGATIVE Final    Comment:        The GeneXpert MRSA Assay (FDA approved for NASAL specimens only), is one component of a comprehensive MRSA colonization surveillance program. It is not intended to diagnose MRSA infection nor to guide or monitor treatment for MRSA infections.          Radiology Studies: No results found.      Scheduled Meds: . amLODipine  10 mg Oral Daily  . cholecalciferol  1,000 Units Oral q morning - 10a  . citalopram  10 mg Oral Daily  . cloNIDine  0.1 mg Oral Daily  . darbepoetin (ARANESP) injection - NON-DIALYSIS  100 mcg Subcutaneous Q Sun-1800  . feeding supplement (ENSURE ENLIVE)  237 mL Oral TID BM  . folic acid  1 mg Oral Daily  . furosemide  40 mg Oral Daily  . hydrALAZINE  100 mg Oral TID  . isosorbide dinitrate  30 mg Oral TID  . mouth rinse  15 mL Mouth Rinse BID  . polyethylene glycol  17 g Oral Daily  . senna-docusate  2 tablet Oral BID   Continuous Infusions: . sodium chloride Stopped (09/15/17 1900)  . ferric gluconate (FERRLECIT/NULECIT) IV 250 mg (09/16/17 1534)     LOS: 6 days    Time spent: 25 minutes.     Hosie Poisson, MD [facility] Pager [phone]  If 7PM-7AM, please contact night-coverage [email or web address] Password [facility] 09/16/2017, 4:34 PM

## 2017-09-16 NOTE — Progress Notes (Signed)
Subjective:  UOP still not well recorded - she has no c/o's Objective Vital signs in last 24 hours: Vitals:   09/15/17 1826 09/15/17 2050 09/16/17 0514 09/16/17 1035  BP: 133/67 (!) 150/69 (!) 160/66 (!) 156/68  Pulse: 90 82 93 81  Resp: 18 19 19    Temp:  98.3 F (36.8 C) 98.9 F (37.2 C)   TempSrc:  Oral Oral   SpO2:  94% 92%   Weight:   62.6 kg (138 lb 0.1 oz)   Height:       Weight change: -0.4 kg (-14.1 oz)  Intake/Output Summary (Last 24 hours) at 09/16/17 1308 Last data filed at 09/16/17 1013  Gross per 24 hour  Intake              837 ml  Output                0 ml  Net              837 ml    Assessment/Plan: 71 year old black female with 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 She has long-standing hypertension and proteinuria 1.Renal- progressive CKD over the last 2 years. Greater than 300 protein noted on urinalysis.  Long-standing history of poorly controlled hypertension, she certainly could have hypertensive related kidney disease. In addition, she has cirrhosis but doesn't appear to have the hemodynamics associated with hepatorenal syndrome. I do not appreciate any nephrotoxins on her current medication list.  Her renal ultrasound shows evidence of chronic kidney disease. I'm not sure if there will be much for reversibility here.  Not uremic at present.  I hope that when she gets over this acute illness her kidney function may settle out a little bit better.  She agrees to follow with me as an outpatient.  SPEP to be drawn 2. Hypertension/volume  - sounds as if long-standing poorly controlled hypertension. Right now, on Norvasc 10 daily, hydralazine 100  3 times a day, clonidine once daily and Isordil. Not sure she will be compliant with the clonidine. I also not sure she will take the hydralazine 3 times per day. She will need more fine-tuning as an outpatient 3. Anemia  - is anemic and iron deficient. I will replete iron and will start ESA as well. This  could be continued in the outpatient setting. 4. Hyponatremia- probably associated with her cirrhosis.  trended up this hospitalization. Worse today, urine osm inappropriately high- probably needs fluid restrict and a little loop diuretic- have started     Yarima Penman A    Labs: Basic Metabolic Panel:  Recent Labs Lab 09/10/17 0852  09/15/17 1007 09/16/17 0525 09/16/17 0929  NA 115*  114*  < > 126* 126* 122*  K 5.9*  < > 4.6 4.5 4.6  CL 89*  < > 96* 94* 94*  CO2 11*  < > 19* 21* 14*  GLUCOSE 102*  < > 102* 102* 123*  BUN 45*  < > 67* 72* 68*  CREATININE 3.10*  < > 3.33* 3.34* 3.44*  CALCIUM 8.4*  < > 8.5* 8.2* 8.0*  PHOS 4.2  --   --  3.6  --   < > = values in this interval not displayed. Liver Function Tests:  Recent Labs Lab 09/09/17 2343 09/16/17 0525  AST 34  --   ALT 19  --   ALKPHOS 51  --   BILITOT 0.5  --   PROT 7.0  --   ALBUMIN 2.7* 2.1*  No results for input(s): LIPASE, AMYLASE in the last 168 hours.  Recent Labs Lab 09/09/17 2343  AMMONIA <9*   CBC:  Recent Labs Lab 09/09/17 2343  09/12/17 0352 09/13/17 0507 09/14/17 0343 09/15/17 0358 09/16/17 0525  WBC 11.4*  < > 9.5 10.0 9.0 7.4 9.0  NEUTROABS 10.0*  --   --   --   --   --   --   HGB 11.2*  < > 8.8* 8.9* 8.6* 7.5* 8.2*  HCT 31.5*  < > 25.2* 25.3* 25.0* 22.3* 24.3*  MCV 82.5  < > 83.2 83.8 85.3 84.8 85.0  PLT 169  < > 162 184 161 145* 178  < > = values in this interval not displayed. Cardiac Enzymes:  Recent Labs Lab 09/10/17 0852 09/10/17 1541 09/10/17 2047  TROPONINI 0.16* 0.15* 0.13*   CBG:  Recent Labs Lab 09/10/17 0807 09/10/17 1613 09/10/17 2016  GLUCAP 127* 101* 103*    Iron Studies: No results for input(s): IRON, TIBC, TRANSFERRIN, FERRITIN in the last 72 hours. Studies/Results: No results found. Medications: Infusions: . sodium chloride Stopped (09/15/17 1900)  . ferric gluconate (FERRLECIT/NULECIT) IV Stopped (09/15/17 1736)    Scheduled  Medications: . amLODipine  10 mg Oral Daily  . cholecalciferol  1,000 Units Oral q morning - 10a  . citalopram  10 mg Oral Daily  . cloNIDine  0.1 mg Oral Daily  . feeding supplement (ENSURE ENLIVE)  237 mL Oral TID BM  . folic acid  1 mg Oral Daily  . hydrALAZINE  100 mg Oral TID  . isosorbide dinitrate  30 mg Oral TID  . mouth rinse  15 mL Mouth Rinse BID  . polyethylene glycol  17 g Oral Daily  . senna-docusate  2 tablet Oral BID    have reviewed scheduled and prn medications.  Physical Exam: General: NAD- has family in room Heart: RRR Lungs: clear Abdomen: soft, non tender Extremities: no peripheral edema    09/16/2017,1:08 PM  LOS: 6 days

## 2017-09-17 ENCOUNTER — Other Ambulatory Visit: Payer: Self-pay

## 2017-09-17 LAB — RENAL FUNCTION PANEL
ANION GAP: 9 (ref 5–15)
Albumin: 2 g/dL — ABNORMAL LOW (ref 3.5–5.0)
BUN: 67 mg/dL — AB (ref 6–20)
CALCIUM: 8.1 mg/dL — AB (ref 8.9–10.3)
CHLORIDE: 96 mmol/L — AB (ref 101–111)
CO2: 23 mmol/L (ref 22–32)
CREATININE: 3.28 mg/dL — AB (ref 0.44–1.00)
GFR, EST AFRICAN AMERICAN: 15 mL/min — AB (ref >60)
GFR, EST NON AFRICAN AMERICAN: 13 mL/min — AB (ref >60)
GLUCOSE: 90 mg/dL (ref 65–99)
PHOSPHORUS: 4.6 mg/dL (ref 2.5–4.6)
Potassium: 3.9 mmol/L (ref 3.5–5.1)
Sodium: 128 mmol/L — ABNORMAL LOW (ref 135–145)

## 2017-09-17 MED ORDER — POLYETHYLENE GLYCOL 3350 17 G PO PACK: 17.0000 g | PACK | Freq: Every day | ORAL | 0 refills | Status: DC

## 2017-09-17 MED ORDER — ISOSORBIDE DINITRATE 30 MG PO TABS: 30.0000 mg | ORAL_TABLET | Freq: Three times a day (TID) | ORAL | 0 refills | Status: DC

## 2017-09-17 MED ORDER — ENSURE ENLIVE PO LIQD: 237.0000 mL | Freq: Three times a day (TID) | ORAL | 12 refills | Status: DC

## 2017-09-17 MED ORDER — CLONIDINE HCL 0.1 MG PO TABS: 0.1000 mg | ORAL_TABLET | Freq: Every day | ORAL | 1 refills | Status: DC

## 2017-09-17 MED ORDER — FUROSEMIDE 40 MG PO TABS: 40.0000 mg | ORAL_TABLET | Freq: Every day | ORAL | 0 refills | Status: DC

## 2017-09-17 MED ORDER — LACTULOSE 10 GM/15ML PO SOLN: 20.0000 g | Freq: Every day | ORAL | 3 refills | Status: DC

## 2017-09-17 NOTE — Progress Notes (Signed)
Physical Therapy Treatment Patient Details Name: Sarah Harmon MRN: [id number] DOB: 1946-02-07 Today's Date: 09/17/2017    History of Present Illness Pt is a 71 y/o female admitted secondary to worsening SOB and slurred speech. Pt with alcoholic cirrhosis of the liver. PMH includes depression, anxiety, HTN, osteopenia, alcohol abuse, hepatocellular carcinoma, and laennec's cirrhosis.     PT Comments    Pt progressing towards physical therapy goals. Was able to ambulate this session without an AD, and with fewer instances of LOB/unsteadiness. Pt with one large LOB ini which min assist was required to recover, when attempting to self-clean after toileting while standing. Will continue to follow and progress as able per POC. Continue to feel that HHPT would be beneficial at d/c.    Follow Up Recommendations  Home health PT;Supervision/Assistance - 24 hour     Equipment Recommendations  None recommended by PT    Recommendations for Other Services       Precautions / Restrictions Precautions Precautions: None Restrictions Weight Bearing Restrictions: No    Mobility  Bed Mobility Overal bed mobility: Modified Independent             General bed mobility comments: Increased time, however, no external assist required.  Transfers Overall transfer level: Needs assistance Equipment used: None Transfers: Sit to/from Stand Sit to Stand: Min guard         General transfer comment: Min guard for steadying assist. VC's for hand placement on seated surface for safety. At end of session pt required VC's for controlled descent back on EOB.   Ambulation/Gait Ambulation/Gait assistance: Min guard;Supervision Ambulation Distance (Feet): 500 Feet Assistive device: None Gait Pattern/deviations: Step-through pattern;Decreased stride length;Drifts right/left Gait velocity: Decreased   General Gait Details: Increased steadiness noted. Continues to drift R and L during ambulation.  Min guard to close supervision for safety. Noted some SOB at end of gait training, however VSS.   Stairs     Stair Management: Two rails;Step to pattern;Forwards   General stair comments: Min guard for safety. Educated about use of step to pattern for increased safety at home. Educated to have son with her for stair management as well.   Wheelchair Mobility    Modified Rankin (Stroke Patients Only)       Balance Overall balance assessment: Needs assistance Sitting-balance support: No upper extremity supported;Feet supported Sitting balance-Leahy Scale: Good     Standing balance support: Bilateral upper extremity supported;No upper extremity supported;During functional activity Standing balance-Leahy Scale: Fair Standing balance comment: Static standing without UE support                  Standardized Balance Assessment Standardized Balance Assessment : Dynamic Gait Index   Dynamic Gait Index Level Surface: Mild Impairment Change in Gait Speed: Mild Impairment Gait with Horizontal Head Turns: Mild Impairment Gait with Vertical Head Turns: Mild Impairment Gait and Pivot Turn: Normal Step Over Obstacle: Mild Impairment      Cognition Arousal/Alertness: Awake/alert Behavior During Therapy: WFL for tasks assessed/performed Overall Cognitive Status: No family/caregiver present to determine baseline cognitive functioning                                 General Comments: Continue to notice some slowed cognitive processing. Unsure if this is baseline.       Exercises      General Comments        Pertinent Vitals/Pain Pain Assessment: No/denies pain  Home Living                      Prior Function            PT Goals (current goals can now be found in the care plan section) Acute Rehab PT Goals Patient Stated Goal: to go home  PT Goal Formulation: With patient Time For Goal Achievement: 09/20/17 Potential to Achieve Goals:  Good Progress towards PT goals: Progressing toward goals    Frequency    Min 3X/week      PT Plan Current plan remains appropriate    Co-evaluation              AM-PAC PT "6 Clicks" Daily Activity  Outcome Measure  Difficulty turning over in bed (including adjusting bedclothes, sheets and blankets)?: None Difficulty moving from lying on back to sitting on the side of the bed? : A Little Difficulty sitting down on and standing up from a chair with arms (e.g., wheelchair, bedside commode, etc,.)?: Unable Help needed moving to and from a bed to chair (including a wheelchair)?: A Little Help needed walking in hospital room?: A Little Help needed climbing 3-5 steps with a railing? : A Little 6 Click Score: 17    End of Session Equipment Utilized During Treatment: Gait belt Activity Tolerance: Patient tolerated treatment well Patient left: in chair;with call bell/phone within reach Nurse Communication: Mobility status PT Visit Diagnosis: Unsteadiness on feet (R26.81);Muscle weakness (generalized) (M62.81)     Time: 7001-7494 PT Time Calculation (min) (ACUTE ONLY): 13 min  Charges:  $Gait Training: 8-22 mins                    G Codes:       Rolinda Roan, PT, DPT Acute Rehabilitation Services Pager: [place] 09/17/2017, 1:13 PM

## 2017-09-17 NOTE — Progress Notes (Signed)
[facility] rounding Note   Subjective:   No recording of UOP at all Feels fine "When am I going home?"  Objective Vital signs in last 24 hours: Vitals:   09/16/17 2105 09/17/17 0443 09/17/17 0500 09/17/17 0924  BP: (!) 147/61 (!) 150/66  (!) 154/62  Pulse: 90 88    Resp: 17 17    Temp: 98.4 F (36.9 C) 98.8 F (37.1 C)    TempSrc: Oral Oral    SpO2: 99% 97%    Weight:   63 kg (138 lb 14.2 oz)   Height:       Weight change: 0.4 kg (14.1 oz)  Intake/Output Summary (Last 24 hours) at 09/17/17 1443 Last data filed at 09/16/17 1500  Gross per 24 hour  Intake              240 ml  Output                0 ml  Net              240 ml    Physical Exam: Lying in bed NAD No family in with her Alert, oriented Edentulous No JVD Lungs clear Regular S1S2 No S3 Abdomen soft, no tenderness No edema whatsoever No asterixus   Labs:  Recent Labs Lab 09/16/17 0525 09/16/17 0929 09/17/17 0633  NA 126* 122* 128*  K 4.5 4.6 3.9  CL 94* 94* 96*  CO2 21* 14* 23  GLUCOSE 102* 123* 90  BUN 72* 68* 67*  CREATININE 3.34* 3.44* 3.28*  CALCIUM 8.2* 8.0* 8.1*  PHOS 3.6  --  4.6   Iron/TIBC/Ferritin/ %Sat    Component Value Date/Time   IRON 19 (L) 09/13/2017 0507   IRON 51 05/16/2016 1027   TIBC 286 09/13/2017 0507   TIBC 298 05/16/2016 1027   FERRITIN 342 (H) 09/13/2017 0507   FERRITIN 314 (H) 05/16/2016 1027   IRONPCTSAT 7 (L) 09/13/2017 0507   IRONPCTSAT 17 (L) 05/16/2016 1027    Recent Labs Lab 09/16/17 0525 09/17/17 0633  ALBUMIN 2.1* 2.0*    Recent Labs Lab 09/12/17 0352 09/13/17 0507 09/14/17 0343 09/15/17 0358 09/16/17 0525  WBC 9.5 10.0 9.0 7.4 9.0  HGB 8.8* 8.9* 8.6* 7.5* 8.2*  HCT 25.2* 25.3* 25.0* 22.3* 24.3*  MCV 83.2 83.8 85.3 84.8 85.0  PLT 162 184 161 145* 178    Recent Labs Lab 09/10/17 1541 09/10/17 2047  TROPONINI 0.15* 0.13*     Recent Labs Lab 09/10/17 1613 09/10/17 2016  GLUCAP 101* 103*   Medications: Infusions: . sodium  chloride Stopped (09/15/17 1900)  . ferric gluconate (FERRLECIT/NULECIT) IV Stopped (09/16/17 1634)    Scheduled Medications: . amLODipine  10 mg Oral Daily  . cholecalciferol  1,000 Units Oral q morning - 10a  . citalopram  10 mg Oral Daily  . cloNIDine  0.1 mg Oral Daily  . darbepoetin (ARANESP) injection - NON-DIALYSIS  100 mcg Subcutaneous Q Sun-1800  . feeding supplement (ENSURE ENLIVE)  237 mL Oral TID BM  . folic acid  1 mg Oral Daily  . furosemide  40 mg Oral Daily  . hydrALAZINE  100 mg Oral TID  . isosorbide dinitrate  30 mg Oral TID  . mouth rinse  15 mL Mouth Rinse BID  . polyethylene glycol  17 g Oral Daily  . senna-docusate  2 tablet Oral BID     Assessment/Plan:  71 year old black female PMH HTN not well controlled, cirrhosis (ETOH +/- autoimmune), progressive CKD past  2 years PTA.  Long standing proteinuria. Admitted with AMS, Na 110, AKI on CKD (creatinine 3.3 from 2.3 1 week PTA). Rec'd 3% saline w/imp'd Na to 120's. Renal US echodense kidneys 9, 11 cm. Asked to see d/t fluctuating creatinine without consistent improvement.   1. Progressive CKD over last 2 years. >300 protein on urinalysis.  Long-standing history of poorly controlled hypertension. Could have hypertensive related kidney disease. In addition, has cirrhosis but doesn't appear to have the hemodynamics associated with HRS. No nephrotoxins current medication list.  Renal ultrasound kidneys echodense. Possibly not much reversibility. Not uremic. SPEP pending. Has agreed to follow with Dr. Moshe Cipro as outpt (and from primary care notes, appears Dr. Jenny Reichmann was already contemplating renal referral anyway).   2. Hypertension/volume  - sounds as if long-standing poorly controlled hypertension. Right now, on Norvasc 10 daily, hydralazine 100  3 times a day, clonidine once daily and Isordil. Not sure she will be compliant with the clonidine. I also not sure she will take the hydralazine 3 times per day. She will need  more fine-tuning as an outpatient.  3. Anemia  - Anemic and iron deficient. Repleting iron with ferric gluconate (has had 2 of 4 doses so far - for some reason did not get 10AM dose today) and and had 100 Aranesp on 9/30 [place]. We will arrange for this to be continued in the outpatient setting. 4. Hyponatremia- probably associated w/cirrhosis. Chronic issue PTA high120's to low 130's. Up after 3% and continues to trend up. The loop diuretic (lasix) started 9.30 will help.   From a renal standpoint could be discharged (if could arrange for her to get at least 1 more of the ordered ferric gluconate doses). Dr. Moshe Cipro will arrange outpt followup for her at [facility]. We will call her with appointment.   Jamal Maes, MD [facility] [phone] Pager 09/17/2017, 2:43 PM

## 2017-09-17 NOTE — Care Management Important Message (Signed)
Important Message  Patient Details  Name: Sarah Harmon MRN: [id number] Date of Birth: [birth year]   Medicare Important Message Given:  Yes    [name] Abena 09/17/2017, 10:01 AM

## 2017-09-18 LAB — PROTEIN ELECTROPHORESIS, SERUM
A/G Ratio: 0.6 — ABNORMAL LOW (ref 0.7–1.7)
ALBUMIN ELP: 2 g/dL — AB (ref 2.9–4.4)
ALPHA-1-GLOBULIN: 0.3 g/dL (ref 0.0–0.4)
Alpha-2-Globulin: 0.9 g/dL (ref 0.4–1.0)
Beta Globulin: 0.8 g/dL (ref 0.7–1.3)
GAMMA GLOBULIN: 1 g/dL (ref 0.4–1.8)
GLOBULIN, TOTAL: 3.1 g/dL (ref 2.2–3.9)
TOTAL PROTEIN ELP: 5.1 g/dL — AB (ref 6.0–8.5)

## 2017-09-18 NOTE — Discharge Summary (Signed)
Physician Discharge Summary  Sarah Harmon [facility]:[id number] DOB: 1946-05-03 DOA: 09/09/2017  PCP: Biagio Borg, MD  Admit date: 09/09/2017 Discharge date: 09/17/2017  Admitted From: Home. Disposition:  Home  Recommendations for Outpatient Follow-up:  1. Follow up with PCP in 1-2 weeks 2. Please obtain BMP/CBC in one week Please follow up with nephrology as recommended.   Discharge Condition:stable. CODE STATUS:full code Diet recommendation: Heart Healthy   Brief/Interim Summary: 71 y/o F with PMH ofcirrhosis (felt to be due to autoimmune with a possible component of alcohol), hepatocellular carcinoma status post treatment, hypertension was admitted on the 9/23 for acute onset of lethargy and slurred speech and acute encephalopathy.   Discharge Diagnoses:  Active Problems:   Anxiety state   Depression   Hyponatremia   Alcoholic cirrhosis of liver without ascites (HCC)   Bradycardia   Acute renal failure superimposed on stage 3 chronic kidney disease (HCC)   Cancer, hepatocellular (HCC)   Hyperkalemia   Acute metabolic encephalopathy   Elevated troponin   Slurred speech  Hypertensive crisis:  Unclear etiology.  Possibly non compliant to meds.clonidine added, isordil increased.  Resolved.    Elevated troponins, possibly demand ischemia from hypertensive emergency:  No chest pain.  She was started on IV heparin initially and later discontinued as her hemoglobin dropped. No obvious signs of bleeding.  She is being monitored off heparin.  She denies any complaints of chest pain.    Acute kidney injury: baseline creatinine appears to be between 1.7 to 2.2.  Came in with creatinine of 3.2.  Nephrotoxins are discontinued.  Renal ultrasound shows medical renal disease.  Creatinine at 3.4 today.  Renal consulted and recommend outpatient follow up.  Acute encephalopathy possibly from acute onset hyponatremia of unclear etiology, metabolic in nature.  Resolved.  She  is alert and oriented.    Hyponatremia:  Improving with fluid restriction and lasix.    Liver cirrhosis:  Hepatocellular carcinoma:  Outpatient follow up .    Anemia:  Baseline around 12.  Admitted with 9, and hemoglobin dropped to 8.2  Iron deficiency on anemia panel.  iron supplementation and iV iron infusion added on.      Discharge Instructions  Discharge Instructions    Diet - low sodium heart healthy    Complete by:  As directed    Discharge instructions    Complete by:  As directed    Follow up with Nephrology as recommended.     Allergies as of 09/17/2017   No Known Allergies     Medication List    STOP taking these medications   ibuprofen 200 MG tablet Commonly known as:  ADVIL,MOTRIN     TAKE these medications   ALPRAZolam 0.5 MG tablet Commonly known as:  XANAX TAKE ONE TABLET BY MOUTH AT BEDTIME   amLODipine 10 MG tablet Commonly known as:  NORVASC Take 1 tablet (10 mg total) by mouth daily.   citalopram 20 MG tablet Commonly known as:  CELEXA TAKE ONE-HALF TABLET BY MOUTH ONCE DAILY   cloNIDine 0.1 MG tablet Commonly known as:  CATAPRES Take 1 tablet (0.1 mg total) by mouth daily.   feeding supplement (ENSURE ENLIVE) Liqd Take 237 mLs by mouth 3 (three) times daily between meals.   folic acid 1 MG tablet Commonly known as:  FOLVITE TAKE ONE TABLET BY MOUTH ONCE DAILY   furosemide 40 MG tablet Commonly known as:  LASIX Take 1 tablet (40 mg total) by mouth daily.   hydrALAZINE 100  MG tablet Commonly known as:  APRESOLINE TAKE ONE TABLET BY MOUTH THREE TIMES DAILY   isosorbide dinitrate 30 MG tablet Commonly known as:  ISORDIL Take 1 tablet (30 mg total) by mouth 3 (three) times daily. What changed:  medication strength  See the new instructions.   lactulose 10 GM/15ML solution Commonly known as:  CHRONULAC Take 30 mLs (20 g total) by mouth daily.   polyethylene glycol packet Commonly known as:  MIRALAX /  GLYCOLAX Take 17 g by mouth daily.   propranolol 40 MG tablet Commonly known as:  INDERAL Take 1 tablet (40 mg total) by mouth 2 (two) times daily.   Vitamin D3 1000 units Caps Take 200 Units by mouth every morning.      Follow-up Information    Biagio Borg, MD. Schedule an appointment as soon as possible for a visit in 1 week(s).   Specialties:  Internal Medicine, Radiology Why:  follow up post hospitalizaton visit.  Contact information: [place] [id number] [phone]          No Known Allergies  Consultations:  nephrology   Procedures/Studies: Dg Chest 2 View  Result Date: 09/10/2017 CLINICAL DATA:  Dyspnea this evening. EXAM: CHEST  2 VIEW COMPARISON:  02/27/2015 CXR and 03/06/2015 CT. FINDINGS: Top-normal sized heart with aortic atherosclerosis at the arch. Confluent airspace opacities are seen bilaterally, left greater than right sulcal which is likely due to pulmonary edema. Superimposed pneumonia at the left lung base and/or effusion is not excluded. No acute osseous abnormality. IMPRESSION: Hazy opacities at the lung bases, left greater than right. Pulmonary edema is suspected. Superimposed left lower lobe pneumonia and/or pleural effusion is not excluded. Electronically Signed   By: Ashley Royalty M.D.   On: 09/10/2017 00:46   Ct Head Wo Contrast  Result Date: 09/10/2017 CLINICAL DATA:  Slurred speech, last seen normal yesterday. EXAM: CT HEAD WITHOUT CONTRAST TECHNIQUE: Contiguous axial images were obtained from the base of the skull through the vertex without intravenous contrast. COMPARISON:  03/04/2015 head CT, 03/05/2015 MRI. FINDINGS: Brain: Interval progression of moderate to marked chronic small vessel ischemia. No acute intracranial hemorrhage, midline shift or edema. No intra-axial mass nor extra-axial fluid collections. No large vascular territory infarcts. Idiopathic bilateral basal ganglial calcifications. No hydrocephalus.  Vascular: No hyperdense vessel or unexpected calcification. Moderate atherosclerosis of the cavernous internal carotids. Skull: Negative for fracture or suspicious osseous lesions. Dense falcine ossifications. Sinuses/Orbits: Mild ethmoid and left maxillary sinus mucosal thickening. The frontal and sphenoid sinuses are clear. Clear bilateral mastoids. Other: None IMPRESSION: 1. Progression of chronic small vessel ischemic disease since 2016. 2. No acute intracranial abnormality. Electronically Signed   By: Ashley Royalty M.D.   On: 09/10/2017 00:30   US Renal  Result Date: 09/10/2017 CLINICAL DATA:  Acute kidney injury EXAM: RENAL / URINARY TRACT ULTRASOUND COMPLETE COMPARISON:  Abdominal MRI 05/15/2017 FINDINGS: Right Kidney: Length: 9.2 cm.  Renal parenchyma is echogenic.  No hydronephrosis. Left Kidney: Length: 11.2 cm. Renal parenchyma is echogenic. There is a complex cystic lesion near the left upper pole that measures 3.7 x 2.5 x 2.5 cm. No hydronephrosis. Bladder: Appears normal for degree of bladder distention. Other: Distended gallbladder, as seen on MRI of 05/15/2017. Bilateral pleural effusions. IMPRESSION: 1. No hydronephrosis. Echogenic renal parenchyma, consistent with chronic medical renal disease. 2. Bilateral pleural effusions, incompletely visualized. 3. Distended gallbladder, as on MRI of 05/15/2017. Electronically Signed   By: Cletus Gash.D.  On: 09/10/2017 06:00       Subjective: No complaints.   Discharge Exam: Vitals:   09/17/17 1300 09/17/17 1706  BP: (!) 155/69 (!) 155/52  Pulse: 88   Resp: 16   Temp: 98.6 F (37 C)   SpO2: 99%    Vitals:   09/17/17 0500 09/17/17 0924 09/17/17 1300 09/17/17 1706  BP:  (!) 154/62 (!) 155/69 (!) 155/52  Pulse:   88   Resp:   16   Temp:   98.6 F (37 C)   TempSrc:   Oral   SpO2:   99%   Weight: 63 kg (138 lb 14.2 oz)     Height:        General: Pt is alert, awake, not in acute distress Cardiovascular: RRR, S1/S2 +, no  rubs, no gallops Respiratory: CTA bilaterally, no wheezing, no rhonchi Abdominal: Soft, NT, ND, bowel sounds + Extremities: no edema, no cyanosis    The results of significant diagnostics from this hospitalization (including imaging, microbiology, ancillary and laboratory) are listed below for reference.     Microbiology: Recent Results (from the past 240 hour(s))  MRSA PCR Screening     Status: None   Collection Time: 09/10/17  8:44 AM  Result Value Ref Range Status   MRSA by PCR NEGATIVE NEGATIVE Final    Comment:        The GeneXpert MRSA Assay (FDA approved for NASAL specimens only), is one component of a comprehensive MRSA colonization surveillance program. It is not intended to diagnose MRSA infection nor to guide or monitor treatment for MRSA infections.      Labs: BNP (last 3 results) No results for input(s): BNP in the last 8760 hours. Basic Metabolic Panel:  Recent Labs Lab 09/12/17 0352  09/14/17 1353 09/15/17 1007 09/16/17 0525 09/16/17 0929 09/17/17 0633  NA 125*  < > 128* 126* 126* 122* 128*  K 4.0  < > 4.3 4.6 4.5 4.6 3.9  CL 97*  < > 99* 96* 94* 94* 96*  CO2 17*  < > 19* 19* 21* 14* 23  GLUCOSE 91  < > 157* 102* 102* 123* 90  BUN 60*  < > 67* 67* 72* 68* 67*  CREATININE 2.85*  < > 3.47* 3.33* 3.34* 3.44* 3.28*  CALCIUM 8.3*  < > 8.8* 8.5* 8.2* 8.0* 8.1*  MG 1.8  --   --   --   --   --   --   PHOS  --   --   --   --  3.6  --  4.6  < > = values in this interval not displayed. Liver Function Tests:  Recent Labs Lab 09/16/17 0525 09/17/17 0633  ALBUMIN 2.1* 2.0*   No results for input(s): LIPASE, AMYLASE in the last 168 hours. No results for input(s): AMMONIA in the last 168 hours. CBC:  Recent Labs Lab 09/12/17 0352 09/13/17 0507 09/14/17 0343 09/15/17 0358 09/16/17 0525  WBC 9.5 10.0 9.0 7.4 9.0  HGB 8.8* 8.9* 8.6* 7.5* 8.2*  HCT 25.2* 25.3* 25.0* 22.3* 24.3*  MCV 83.2 83.8 85.3 84.8 85.0  PLT 162 184 161 145* 178   Cardiac  Enzymes: No results for input(s): CKTOTAL, CKMB, CKMBINDEX, TROPONINI in the last 168 hours. BNP: Invalid input(s): POCBNP CBG: No results for input(s): GLUCAP in the last 168 hours. D-Dimer No results for input(s): DDIMER in the last 72 hours. Hgb A1c No results for input(s): HGBA1C in the last 72 hours. Lipid Profile No results  for input(s): CHOL, HDL, LDLCALC, TRIG, CHOLHDL, LDLDIRECT in the last 72 hours. Thyroid function studies No results for input(s): TSH, T4TOTAL, T3FREE, THYROIDAB in the last 72 hours.  Invalid input(s): FREET3 Anemia work up No results for input(s): VITAMINB12, FOLATE, FERRITIN, TIBC, IRON, RETICCTPCT in the last 72 hours. Urinalysis    Component Value Date/Time   COLORURINE YELLOW 09/10/2017 0605   APPEARANCEUR HAZY (A) 09/10/2017 0605   LABSPEC 1.014 09/10/2017 0605   PHURINE 6.0 09/10/2017 0605   GLUCOSEU NEGATIVE 09/10/2017 0605   GLUCOSEU NEGATIVE 09/04/2017 0943   HGBUR NEGATIVE 09/10/2017 0605   BILIRUBINUR NEGATIVE 09/10/2017 0605   KETONESUR NEGATIVE 09/10/2017 0605   PROTEINUR >=300 (A) 09/10/2017 0605   UROBILINOGEN 0.2 09/04/2017 0943   NITRITE NEGATIVE 09/10/2017 0605   LEUKOCYTESUR NEGATIVE 09/10/2017 0605   Sepsis Labs Invalid input(s): PROCALCITONIN,  WBC,  LACTICIDVEN Microbiology Recent Results (from the past 240 hour(s))  MRSA PCR Screening     Status: None   Collection Time: 09/10/17  8:44 AM  Result Value Ref Range Status   MRSA by PCR NEGATIVE NEGATIVE Final    Comment:        The GeneXpert MRSA Assay (FDA approved for NASAL specimens only), is one component of a comprehensive MRSA colonization surveillance program. It is not intended to diagnose MRSA infection nor to guide or monitor treatment for MRSA infections.      Time coordinating discharge: Over 30 minutes  SIGNED:   Hosie Poisson, MD  [facility] 09/18/2017, 12:36 PM Pager   If 7PM-7AM, please contact  night-coverage [email or web address] Password [email or web address]

## 2017-09-19 ENCOUNTER — Telehealth: Payer: Self-pay | Admitting: *Deleted

## 2017-09-19 NOTE — Telephone Encounter (Signed)
Transition Care Management Follow-up Telephone Call   Date discharged? 09/17/17   How have you been since you were released from the hospital? Called pt spoke w/daughter [name]) she states mom is ok   Do you understand why you were in the hospital? YES   Do you understand the discharge instructions? YES   Where were you discharged to? Home   Items Reviewed:  Medications reviewed: YES  Allergies reviewed: YES  Dietary changes reviewed: YES  Referrals reviewed: YES, daughter states they are still waiting call for nephrology appt   Functional Questionnaire:   Activities of Daily Living (ADLs):   She states she are independent in the following: Feeding, bath and hygiene, toileting, dressing States they require assistance with the following: ambulation and continence   Any transportation issues/concerns?: NO   Any patient concerns? YES, daughter was asking why hospital was not giving mom her Lactulose. Per d/c med is still on list inform [name] I'm not 100% sure that would be a question for MD when she see him on 09/25/17   Confirmed importance and date/time of follow-up visits scheduled YES, 09/25/17  Provider Appointment booked with Dr. Jenny Reichmann  Confirmed with patient if condition begins to worsen call PCP or go to the ER.  Patient was given the office number and encouraged to call back with question or concerns.  : YES

## 2017-09-20 ENCOUNTER — Telehealth: Payer: Self-pay | Admitting: Emergency Medicine

## 2017-09-20 DIAGNOSIS — Z8505 Personal history of malignant neoplasm of liver: Secondary | ICD-10-CM | POA: Diagnosis not present

## 2017-09-20 DIAGNOSIS — I129 Hypertensive chronic kidney disease with stage 1 through stage 4 chronic kidney disease, or unspecified chronic kidney disease: Secondary | ICD-10-CM | POA: Diagnosis not present

## 2017-09-20 DIAGNOSIS — G9341 Metabolic encephalopathy: Secondary | ICD-10-CM | POA: Diagnosis not present

## 2017-09-20 DIAGNOSIS — K7460 Unspecified cirrhosis of liver: Secondary | ICD-10-CM | POA: Diagnosis not present

## 2017-09-20 DIAGNOSIS — D631 Anemia in chronic kidney disease: Secondary | ICD-10-CM | POA: Diagnosis not present

## 2017-09-20 DIAGNOSIS — I160 Hypertensive urgency: Secondary | ICD-10-CM | POA: Diagnosis not present

## 2017-09-20 DIAGNOSIS — F419 Anxiety disorder, unspecified: Secondary | ICD-10-CM | POA: Diagnosis not present

## 2017-09-20 DIAGNOSIS — F329 Major depressive disorder, single episode, unspecified: Secondary | ICD-10-CM | POA: Diagnosis not present

## 2017-09-20 DIAGNOSIS — N183 Chronic kidney disease, stage 3 (moderate): Secondary | ICD-10-CM | POA: Diagnosis not present

## 2017-09-20 NOTE — Telephone Encounter (Signed)
Ok to sign.

## 2017-09-20 NOTE — Telephone Encounter (Signed)
Sarah Harmon has been notified and will fax over orders

## 2017-09-20 NOTE — Telephone Encounter (Signed)
Holly from [facility] called and wants to know if you will be willing to sign the nursing and PT orders. Patient doesn't need OT. Please advise thanks.

## 2017-09-23 DIAGNOSIS — I160 Hypertensive urgency: Secondary | ICD-10-CM | POA: Diagnosis not present

## 2017-09-23 DIAGNOSIS — D631 Anemia in chronic kidney disease: Secondary | ICD-10-CM | POA: Diagnosis not present

## 2017-09-23 DIAGNOSIS — N183 Chronic kidney disease, stage 3 (moderate): Secondary | ICD-10-CM | POA: Diagnosis not present

## 2017-09-23 DIAGNOSIS — I129 Hypertensive chronic kidney disease with stage 1 through stage 4 chronic kidney disease, or unspecified chronic kidney disease: Secondary | ICD-10-CM | POA: Diagnosis not present

## 2017-09-23 DIAGNOSIS — G9341 Metabolic encephalopathy: Secondary | ICD-10-CM | POA: Diagnosis not present

## 2017-09-23 DIAGNOSIS — K7460 Unspecified cirrhosis of liver: Secondary | ICD-10-CM | POA: Diagnosis not present

## 2017-09-25 ENCOUNTER — Encounter: Payer: Self-pay | Admitting: Internal Medicine

## 2017-09-25 ENCOUNTER — Ambulatory Visit (INDEPENDENT_AMBULATORY_CARE_PROVIDER_SITE_OTHER): Payer: Medicare Other | Admitting: Internal Medicine

## 2017-09-25 VITALS — BP 118/64 | HR 64 | Ht 63.0 in | Wt 131.0 lb

## 2017-09-25 DIAGNOSIS — K7460 Unspecified cirrhosis of liver: Secondary | ICD-10-CM | POA: Diagnosis not present

## 2017-09-25 DIAGNOSIS — I10 Essential (primary) hypertension: Secondary | ICD-10-CM

## 2017-09-25 DIAGNOSIS — I129 Hypertensive chronic kidney disease with stage 1 through stage 4 chronic kidney disease, or unspecified chronic kidney disease: Secondary | ICD-10-CM | POA: Diagnosis not present

## 2017-09-25 DIAGNOSIS — E222 Syndrome of inappropriate secretion of antidiuretic hormone: Secondary | ICD-10-CM

## 2017-09-25 DIAGNOSIS — G9341 Metabolic encephalopathy: Secondary | ICD-10-CM

## 2017-09-25 DIAGNOSIS — D631 Anemia in chronic kidney disease: Secondary | ICD-10-CM | POA: Diagnosis not present

## 2017-09-25 DIAGNOSIS — N183 Chronic kidney disease, stage 3 unspecified: Secondary | ICD-10-CM

## 2017-09-25 DIAGNOSIS — I160 Hypertensive urgency: Secondary | ICD-10-CM | POA: Diagnosis not present

## 2017-09-25 NOTE — Assessment & Plan Note (Signed)
stable overall by history and exam, recent data reviewed with pt, and pt to continue medical treatment as before,  to f/u any worsening symptoms or concerns BP Readings from Last 3 Encounters:  09/25/17 118/64  09/17/17 (!) 155/52  09/04/17 (!) 220/102

## 2017-09-25 NOTE — Assessment & Plan Note (Signed)
Improved recently with fluid restriction and lasix, declines lab today

## 2017-09-25 NOTE — Patient Instructions (Signed)
Please continue all other medications as before, and refills have been done if requested.  Please have the pharmacy call with any other refills you may need.  Please keep your appointments with your specialists as you may have planned - the kidney doctors  Please return in 3 months, or sooner if needed

## 2017-09-25 NOTE — Progress Notes (Signed)
Subjective:    Patient ID: Sarah Harmon, female    DOB: 07-Oct-1946, 71 y.o.   MRN: [id number]  HPI  Here to f/u after recent hospn 9/23-10/1 for acute onset lethargy, slurred speech, BP urgency, AKI, and acute encephalopathy.  Non compliance of meds possible suspected.  Renal function improved with d/c nephrotoxin. Renal consulted and recommended outpatient f/u - has appt about Nov 5..  HypoNa improved with lfuid restriction and lasix  Did also have mild lower Hgb to from start about 9, to continue iron supplementation. Today - Pt denies chest pain, increased sob or doe, wheezing, orthopnea, PND, increased LE swelling, palpitations, dizziness or syncope.  Pt denies new neurological symptoms such as new headache, or facial or extremity weakness or numbness   Pt denies polydipsia, polyuria  Declines f/u labs today. No new complaints or other interval hx Past Medical History:  Diagnosis Date  . ALCOHOL ABUSE 07/13/2010  . ANXIETY 12/22/2009  . DEPRESSION 12/22/2009  . 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 DJD (degenerative joint disease), cervical    patient denies on preop of 05/10/15   . FATIGUE 12/23/2010  . GALLSTONES 12/22/2009  . Gallstones   . Headache    occasional   . HYPERLIPIDEMIA 12/22/2009  . HYPERTENSION 12/22/2009  . HYPONATREMIA 12/22/2009  . 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 OSTEOPENIA 12/22/2009  . Pancytopenia 07/13/2010  . RENAL CYST, LEFT 12/22/2009  . VITAMIN D DEFICIENCY 12/23/2010  . WEIGHT LOSS 12/23/2010   Past Surgical History:  Procedure Laterality Date  . ABDOMINAL HYSTERECTOMY    . IR GENERIC HISTORICAL  10/18/2016   IR RADIOLOGIST EVAL & MGMT 10/18/2016 [name], MD [facility] INTERV RAD  . IR RADIOLOGIST EVAL & MGMT  05/30/2017    reports that she has never smoked. She has never used smokeless tobacco. She reports that she drinks about 1.8 - 2.4 oz of alcohol per week . She reports that she does not use drugs. family history includes Breast cancer in her other  and sister; Gastric cancer in her sister; Lung cancer in her brother and sister. No Known Allergies Current Outpatient Prescriptions on File Prior to Visit  Medication Sig Dispense Refill  . ALPRAZolam (XANAX) 0.5 MG tablet TAKE ONE TABLET BY MOUTH AT BEDTIME 90 tablet 1  . amLODipine (NORVASC) 10 MG tablet Take 1 tablet (10 mg total) by mouth daily. 90 tablet 2  . Cholecalciferol (VITAMIN D3) 1000 UNITS CAPS Take 200 Units by mouth every morning.     . citalopram (CELEXA) 20 MG tablet TAKE ONE-HALF TABLET BY MOUTH ONCE DAILY 90 tablet 1  . cloNIDine (CATAPRES) 0.1 MG tablet Take 1 tablet (0.1 mg total) by mouth daily. 60 tablet 1  . feeding supplement, ENSURE ENLIVE, (ENSURE ENLIVE) LIQD Take 237 mLs by mouth 3 (three) times daily between meals. 622 mL 12  . folic acid (FOLVITE) 1 MG tablet TAKE ONE TABLET BY MOUTH ONCE DAILY 90 tablet 0  . furosemide (LASIX) 40 MG tablet Take 1 tablet (40 mg total) by mouth daily. 30 tablet 0  . hydrALAZINE (APRESOLINE) 100 MG tablet TAKE ONE TABLET BY MOUTH THREE TIMES DAILY 270 tablet 2  . isosorbide dinitrate (ISORDIL) 30 MG tablet Take 1 tablet (30 mg total) by mouth 3 (three) times daily. 90 tablet 0  . lactulose (CHRONULAC) 10 GM/15ML solution Take 30 mLs (20 g total) by mouth daily. 473 mL 3  . polyethylene glycol (MIRALAX / GLYCOLAX) packet Take 17 g by mouth  daily. 14 each 0  . propranolol (INDERAL) 40 MG tablet Take 1 tablet (40 mg total) by mouth 2 (two) times daily. 180 tablet 3   No current facility-administered medications on file prior to visit.    Review of Systems  Constitutional: Negative for other unusual diaphoresis or sweats HENT: Negative for ear discharge or swelling Eyes: Negative for other worsening visual disturbances Respiratory: Negative for stridor or other swelling  Gastrointestinal: Negative for worsening distension or other blood Genitourinary: Negative for retention or other urinary change Musculoskeletal: Negative for  other MSK pain or swelling Skin: Negative for color change or other new lesions Neurological: Negative for worsening tremors and other numbness  Psychiatric/Behavioral: Negative for worsening agitation or other fatigue All other system neg per pt    Objective:   Physical Exam BP 118/64   Pulse 64   Ht 5\' 3"  (1.6 m)   Wt 131 lb (59.4 kg)   SpO2 98%   BMI 23.21 kg/m  VS noted,  Constitutional: Pt appears in NAD HENT: Head: NCAT.  Right Ear: External ear normal.  Left Ear: External ear normal.  Eyes: . Pupils are equal, round, and reactive to light. Conjunctivae and EOM are normal Nose: without d/c or deformity Neck: Neck supple. Gross normal ROM Cardiovascular: Normal rate and regular rhythm.   Pulmonary/Chest: Effort normal and breath sounds without rales or wheezing.  Abd:  Soft, NT, ND, + BS, no organomegaly Neurological: Pt is alert. At baseline orientation, motor grossly intact Skin: Skin is warm. No rashes, other new lesions, no LE edema Psychiatric: Pt behavior is normal without agitation  No other exam findings       Assessment & Plan:

## 2017-09-25 NOTE — Assessment & Plan Note (Signed)
Improved recently, declines f/u lab today, plans to f/u with renal soon

## 2017-09-25 NOTE — Assessment & Plan Note (Signed)
Resolved, back to baseline, ? Med non compliance, encouraged compliance, cont to follow

## 2017-09-28 DIAGNOSIS — N183 Chronic kidney disease, stage 3 (moderate): Secondary | ICD-10-CM | POA: Diagnosis not present

## 2017-09-28 DIAGNOSIS — I129 Hypertensive chronic kidney disease with stage 1 through stage 4 chronic kidney disease, or unspecified chronic kidney disease: Secondary | ICD-10-CM | POA: Diagnosis not present

## 2017-09-28 DIAGNOSIS — D631 Anemia in chronic kidney disease: Secondary | ICD-10-CM | POA: Diagnosis not present

## 2017-09-28 DIAGNOSIS — I160 Hypertensive urgency: Secondary | ICD-10-CM | POA: Diagnosis not present

## 2017-09-28 DIAGNOSIS — G9341 Metabolic encephalopathy: Secondary | ICD-10-CM | POA: Diagnosis not present

## 2017-09-28 DIAGNOSIS — K7460 Unspecified cirrhosis of liver: Secondary | ICD-10-CM | POA: Diagnosis not present

## 2017-10-01 DIAGNOSIS — D631 Anemia in chronic kidney disease: Secondary | ICD-10-CM | POA: Diagnosis not present

## 2017-10-01 DIAGNOSIS — I160 Hypertensive urgency: Secondary | ICD-10-CM | POA: Diagnosis not present

## 2017-10-01 DIAGNOSIS — I129 Hypertensive chronic kidney disease with stage 1 through stage 4 chronic kidney disease, or unspecified chronic kidney disease: Secondary | ICD-10-CM | POA: Diagnosis not present

## 2017-10-01 DIAGNOSIS — G9341 Metabolic encephalopathy: Secondary | ICD-10-CM | POA: Diagnosis not present

## 2017-10-01 DIAGNOSIS — K7460 Unspecified cirrhosis of liver: Secondary | ICD-10-CM | POA: Diagnosis not present

## 2017-10-01 DIAGNOSIS — N183 Chronic kidney disease, stage 3 (moderate): Secondary | ICD-10-CM | POA: Diagnosis not present

## 2017-10-02 DIAGNOSIS — I129 Hypertensive chronic kidney disease with stage 1 through stage 4 chronic kidney disease, or unspecified chronic kidney disease: Secondary | ICD-10-CM | POA: Diagnosis not present

## 2017-10-02 DIAGNOSIS — K7460 Unspecified cirrhosis of liver: Secondary | ICD-10-CM | POA: Diagnosis not present

## 2017-10-02 DIAGNOSIS — D631 Anemia in chronic kidney disease: Secondary | ICD-10-CM | POA: Diagnosis not present

## 2017-10-02 DIAGNOSIS — I160 Hypertensive urgency: Secondary | ICD-10-CM | POA: Diagnosis not present

## 2017-10-02 DIAGNOSIS — N183 Chronic kidney disease, stage 3 (moderate): Secondary | ICD-10-CM | POA: Diagnosis not present

## 2017-10-02 DIAGNOSIS — G9341 Metabolic encephalopathy: Secondary | ICD-10-CM | POA: Diagnosis not present

## 2017-10-03 DIAGNOSIS — K7460 Unspecified cirrhosis of liver: Secondary | ICD-10-CM | POA: Diagnosis not present

## 2017-10-03 DIAGNOSIS — G9341 Metabolic encephalopathy: Secondary | ICD-10-CM | POA: Diagnosis not present

## 2017-10-03 DIAGNOSIS — D631 Anemia in chronic kidney disease: Secondary | ICD-10-CM | POA: Diagnosis not present

## 2017-10-03 DIAGNOSIS — I129 Hypertensive chronic kidney disease with stage 1 through stage 4 chronic kidney disease, or unspecified chronic kidney disease: Secondary | ICD-10-CM | POA: Diagnosis not present

## 2017-10-03 DIAGNOSIS — I160 Hypertensive urgency: Secondary | ICD-10-CM | POA: Diagnosis not present

## 2017-10-03 DIAGNOSIS — N183 Chronic kidney disease, stage 3 (moderate): Secondary | ICD-10-CM | POA: Diagnosis not present

## 2017-10-06 ENCOUNTER — Ambulatory Visit (HOSPITAL_COMMUNITY): Payer: Medicare Other

## 2017-10-08 DIAGNOSIS — D631 Anemia in chronic kidney disease: Secondary | ICD-10-CM | POA: Diagnosis not present

## 2017-10-08 DIAGNOSIS — K7460 Unspecified cirrhosis of liver: Secondary | ICD-10-CM | POA: Diagnosis not present

## 2017-10-08 DIAGNOSIS — I160 Hypertensive urgency: Secondary | ICD-10-CM | POA: Diagnosis not present

## 2017-10-08 DIAGNOSIS — I129 Hypertensive chronic kidney disease with stage 1 through stage 4 chronic kidney disease, or unspecified chronic kidney disease: Secondary | ICD-10-CM | POA: Diagnosis not present

## 2017-10-08 DIAGNOSIS — N183 Chronic kidney disease, stage 3 (moderate): Secondary | ICD-10-CM | POA: Diagnosis not present

## 2017-10-08 DIAGNOSIS — G9341 Metabolic encephalopathy: Secondary | ICD-10-CM | POA: Diagnosis not present

## 2017-10-11 ENCOUNTER — Other Ambulatory Visit: Payer: Self-pay | Admitting: Internal Medicine

## 2017-10-11 DIAGNOSIS — G9341 Metabolic encephalopathy: Secondary | ICD-10-CM | POA: Diagnosis not present

## 2017-10-11 DIAGNOSIS — K7460 Unspecified cirrhosis of liver: Secondary | ICD-10-CM | POA: Diagnosis not present

## 2017-10-11 DIAGNOSIS — N183 Chronic kidney disease, stage 3 (moderate): Secondary | ICD-10-CM | POA: Diagnosis not present

## 2017-10-11 DIAGNOSIS — I129 Hypertensive chronic kidney disease with stage 1 through stage 4 chronic kidney disease, or unspecified chronic kidney disease: Secondary | ICD-10-CM | POA: Diagnosis not present

## 2017-10-11 DIAGNOSIS — I160 Hypertensive urgency: Secondary | ICD-10-CM | POA: Diagnosis not present

## 2017-10-11 DIAGNOSIS — D631 Anemia in chronic kidney disease: Secondary | ICD-10-CM | POA: Diagnosis not present

## 2017-10-15 DIAGNOSIS — F419 Anxiety disorder, unspecified: Secondary | ICD-10-CM | POA: Diagnosis not present

## 2017-10-15 DIAGNOSIS — G9341 Metabolic encephalopathy: Secondary | ICD-10-CM | POA: Diagnosis not present

## 2017-10-15 DIAGNOSIS — Z8505 Personal history of malignant neoplasm of liver: Secondary | ICD-10-CM | POA: Diagnosis not present

## 2017-10-15 DIAGNOSIS — F329 Major depressive disorder, single episode, unspecified: Secondary | ICD-10-CM | POA: Diagnosis not present

## 2017-10-15 DIAGNOSIS — I129 Hypertensive chronic kidney disease with stage 1 through stage 4 chronic kidney disease, or unspecified chronic kidney disease: Secondary | ICD-10-CM | POA: Diagnosis not present

## 2017-10-15 DIAGNOSIS — N183 Chronic kidney disease, stage 3 (moderate): Secondary | ICD-10-CM | POA: Diagnosis not present

## 2017-10-15 DIAGNOSIS — I160 Hypertensive urgency: Secondary | ICD-10-CM | POA: Diagnosis not present

## 2017-10-15 DIAGNOSIS — K7460 Unspecified cirrhosis of liver: Secondary | ICD-10-CM | POA: Diagnosis not present

## 2017-10-15 DIAGNOSIS — D631 Anemia in chronic kidney disease: Secondary | ICD-10-CM | POA: Diagnosis not present

## 2017-10-17 DIAGNOSIS — I129 Hypertensive chronic kidney disease with stage 1 through stage 4 chronic kidney disease, or unspecified chronic kidney disease: Secondary | ICD-10-CM | POA: Diagnosis not present

## 2017-10-17 DIAGNOSIS — N183 Chronic kidney disease, stage 3 (moderate): Secondary | ICD-10-CM | POA: Diagnosis not present

## 2017-10-17 DIAGNOSIS — D631 Anemia in chronic kidney disease: Secondary | ICD-10-CM | POA: Diagnosis not present

## 2017-10-17 DIAGNOSIS — G9341 Metabolic encephalopathy: Secondary | ICD-10-CM | POA: Diagnosis not present

## 2017-10-17 DIAGNOSIS — I160 Hypertensive urgency: Secondary | ICD-10-CM | POA: Diagnosis not present

## 2017-10-17 DIAGNOSIS — K7460 Unspecified cirrhosis of liver: Secondary | ICD-10-CM | POA: Diagnosis not present

## 2017-10-18 DIAGNOSIS — G9341 Metabolic encephalopathy: Secondary | ICD-10-CM | POA: Diagnosis not present

## 2017-10-18 DIAGNOSIS — K7460 Unspecified cirrhosis of liver: Secondary | ICD-10-CM | POA: Diagnosis not present

## 2017-10-18 DIAGNOSIS — I160 Hypertensive urgency: Secondary | ICD-10-CM | POA: Diagnosis not present

## 2017-10-18 DIAGNOSIS — D631 Anemia in chronic kidney disease: Secondary | ICD-10-CM | POA: Diagnosis not present

## 2017-10-18 DIAGNOSIS — I129 Hypertensive chronic kidney disease with stage 1 through stage 4 chronic kidney disease, or unspecified chronic kidney disease: Secondary | ICD-10-CM | POA: Diagnosis not present

## 2017-10-18 DIAGNOSIS — N183 Chronic kidney disease, stage 3 (moderate): Secondary | ICD-10-CM | POA: Diagnosis not present

## 2017-10-22 DIAGNOSIS — G9341 Metabolic encephalopathy: Secondary | ICD-10-CM | POA: Diagnosis not present

## 2017-10-22 DIAGNOSIS — D631 Anemia in chronic kidney disease: Secondary | ICD-10-CM | POA: Diagnosis not present

## 2017-10-22 DIAGNOSIS — K7460 Unspecified cirrhosis of liver: Secondary | ICD-10-CM | POA: Diagnosis not present

## 2017-10-22 DIAGNOSIS — N183 Chronic kidney disease, stage 3 (moderate): Secondary | ICD-10-CM | POA: Diagnosis not present

## 2017-10-22 DIAGNOSIS — I160 Hypertensive urgency: Secondary | ICD-10-CM | POA: Diagnosis not present

## 2017-10-22 DIAGNOSIS — N184 Chronic kidney disease, stage 4 (severe): Secondary | ICD-10-CM | POA: Diagnosis not present

## 2017-10-22 DIAGNOSIS — I129 Hypertensive chronic kidney disease with stage 1 through stage 4 chronic kidney disease, or unspecified chronic kidney disease: Secondary | ICD-10-CM | POA: Diagnosis not present

## 2017-10-31 ENCOUNTER — Other Ambulatory Visit: Payer: Self-pay | Admitting: Hematology

## 2017-10-31 ENCOUNTER — Other Ambulatory Visit (HOSPITAL_BASED_OUTPATIENT_CLINIC_OR_DEPARTMENT_OTHER): Payer: Medicare Other

## 2017-10-31 ENCOUNTER — Ambulatory Visit (HOSPITAL_COMMUNITY)
Admission: RE | Admit: 2017-10-31 | Discharge: 2017-10-31 | Disposition: A | Discharge status: Home / Self Care | Payer: Medicare Other | Source: Ambulatory Visit | Attending: Hematology | Admitting: Hematology

## 2017-10-31 DIAGNOSIS — C220 Liver cell carcinoma: Secondary | ICD-10-CM

## 2017-10-31 DIAGNOSIS — K8020 Calculus of gallbladder without cholecystitis without obstruction: Secondary | ICD-10-CM | POA: Diagnosis not present

## 2017-10-31 LAB — CBC WITH DIFFERENTIAL/PLATELET
BASO%: 1.1 % (ref 0.0–2.0)
BASOS ABS: 0 10*3/uL (ref 0.0–0.1)
EOS%: 9 % — AB (ref 0.0–7.0)
Eosinophils Absolute: 0.4 10*3/uL (ref 0.0–0.5)
HEMATOCRIT: 34.2 % — AB (ref 34.8–46.6)
HEMOGLOBIN: 11.2 g/dL — AB (ref 11.6–15.9)
LYMPH#: 0.7 10*3/uL — AB (ref 0.9–3.3)
LYMPH%: 17.9 % (ref 14.0–49.7)
MCH: 28.2 pg (ref 25.1–34.0)
MCHC: 32.6 g/dL (ref 31.5–36.0)
MCV: 86.4 fL (ref 79.5–101.0)
MONO#: 0.5 10*3/uL (ref 0.1–0.9)
MONO%: 11.3 % (ref 0.0–14.0)
NEUT#: 2.5 10*3/uL (ref 1.5–6.5)
NEUT%: 60.7 % (ref 38.4–76.8)
PLATELETS: 123 10*3/uL — AB (ref 145–400)
RBC: 3.96 10*6/uL (ref 3.70–5.45)
RDW: 15.3 % — AB (ref 11.2–14.5)
WBC: 4 10*3/uL (ref 3.9–10.3)

## 2017-10-31 LAB — COMPREHENSIVE METABOLIC PANEL
ALBUMIN: 3.2 g/dL — AB (ref 3.5–5.0)
ALK PHOS: 58 U/L (ref 40–150)
ALT: 14 U/L (ref 0–55)
ANION GAP: 9 meq/L (ref 3–11)
AST: 23 U/L (ref 5–34)
BUN: 27.4 mg/dL — AB (ref 7.0–26.0)
CALCIUM: 9.6 mg/dL (ref 8.4–10.4)
CO2: 24 mEq/L (ref 22–29)
CREATININE: 3.2 mg/dL — AB (ref 0.6–1.1)
Chloride: 105 mEq/L (ref 98–109)
EGFR: 16 mL/min/{1.73_m2} — ABNORMAL LOW (ref >60)
Glucose: 92 mg/dl (ref 70–140)
Potassium: 4.5 mEq/L (ref 3.5–5.1)
Sodium: 137 mEq/L (ref 136–145)
Total Bilirubin: 0.39 mg/dL (ref 0.20–1.20)
Total Protein: 8.2 g/dL (ref 6.4–8.3)

## 2017-10-31 LAB — POCT I-STAT CREATININE: CREATININE: 3.2 mg/dL — AB (ref 0.44–1.00)

## 2017-11-01 DIAGNOSIS — K7460 Unspecified cirrhosis of liver: Secondary | ICD-10-CM | POA: Diagnosis not present

## 2017-11-01 DIAGNOSIS — I160 Hypertensive urgency: Secondary | ICD-10-CM | POA: Diagnosis not present

## 2017-11-01 DIAGNOSIS — N183 Chronic kidney disease, stage 3 (moderate): Secondary | ICD-10-CM | POA: Diagnosis not present

## 2017-11-01 DIAGNOSIS — I129 Hypertensive chronic kidney disease with stage 1 through stage 4 chronic kidney disease, or unspecified chronic kidney disease: Secondary | ICD-10-CM | POA: Diagnosis not present

## 2017-11-01 DIAGNOSIS — D631 Anemia in chronic kidney disease: Secondary | ICD-10-CM | POA: Diagnosis not present

## 2017-11-01 DIAGNOSIS — G9341 Metabolic encephalopathy: Secondary | ICD-10-CM | POA: Diagnosis not present

## 2017-11-02 LAB — AFP TUMOR MARKER: AFP, Serum, Tumor Marker: 10.1 ng/mL — ABNORMAL HIGH (ref 0.0–8.3)

## 2017-11-06 NOTE — Progress Notes (Signed)
[place]  Telephone:([phone]) [phone] [phone]  Clinic Follow Up Note   Patient Care Team: Sarah Borg, MD as PCP - Sarah Bash, MD as Consulting Physician (Hematology) 11/07/2017  CHIEF COMPLAINTS:  Follow up stage I Hepatocellular carcinoma  Oncology History   Hepatocellular carcinoma   Staging form: Liver (Excluding Intrahepatic Bile Ducts), AJCC 7th Edition     Clinical: Stage I (T1, N0, M0) - Unsigned       Hepatocellular carcinoma ([place])   03/01/2015 Tumor Marker    AFP 8.4      03/01/2015 Imaging    Liver MRI showed home 1.5 cm hyperenhancing T2 bright lesion within the right hepatic lobe, consistent with hepatocellular carcinoma. There is an indeterminate 1cm T2 bright lesion within the hepatic dome, w/o definitive arterial phase enhancement       03/01/2015 Initial Diagnosis    Hepatocellular carcinoma      05/14/2015 Procedure    CT guided thermal ablation of hepatocellular carcinoma      12/24/2015 Procedure    Upper Endoscopy: 12/24/15 ENDOSCOPIC IMPRESSION: There was mild to moderate erosive gastritis (mid and distal stomach). This was not classic appearing for portal gastropathy. Biopsies were taken from antrum and body to check for H. pylori, portal gastropathy, GAVE. The examination was otherwise normal. There were no gastric or esophageal varices      12/24/2015 Procedure    Colonoscopy: 12/24/15 ENDOSCOPIC IMPRESSION:  1. Mild diverticulosis was noted in the left colon 2. The examination was otherwise normal RADIOGRAPHIC STUDIES: I have personally reviewed the radiological images as listed and agreed with the findings in the report.       01/25/2016 Imaging    liver MRI with and without contrast showed further decreased in size of ablation defect within the segment 8 of the liver compatible with treated tumor,no new enhancing liver lesions identified. Liver cirrhosis.      09/06/2016 Imaging     MRI ABD  09/06/16 IMPRESSION: 1. Minimal further size reduction at the ablation site, no recurrence or new focus of hepatocellular carcinoma identified. 2. Complex cystic lesion of the left kidney upper pole with some thin internal septations but no measurable enhancement, has minimally increased in size over the past 9 years. High likelihood that this is a benign lesion, categorized as Bosniak category IIF in the past. I would suggest ancillary surveillance at the time of liver follow up. 3. Mild chronic gallbladder wall thickening, with a stone lodged in the neck of the gallbladder. Mild chronic gallbladder distention.       05/15/2017 Imaging    MRI Abdomen WO Contrast 05/15/17 IMPRESSION: Ablation zone in segment 8 is favored to have mildly decreased in size, now measuring 1.4 cm. This is incompletely evaluated in the absence of intravenous contrast. Otherwise, no focal hepatic lesion is seen. Suspected mild cirrhosis. 4.0 x 3.2 cm complex/septated left upper pole renal cyst, incompletely evaluated, but grossly unchanged.      10/31/2017 Imaging    MRI Abdomen WO Contrast 10/31/17  IMPRESSION: 1. Stable appearance of the ablation defect in segment VIII of the liver. No new liver abnormality on the current study. 2. Subtle nodularity of hepatic contour suggests cirrhosis. 3. Gallbladder distension with gallstone again noted lodged in the gallbladder neck/cystic duct. No associated biliary dilatation. 4. Stable appearance complex/septated cyst upper pole left kidney on this noncontrast exam. Lesion remains compatible with Bosniak II F lesion as previously characterized.      HISTORY OF PRESENTING  ILLNESS:  Sarah Harmon 71 y.o. female is here because of abnormal liver MRI, which is highly suspicious for [facility].  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 No history of ascites or GI bleeding. She  has been followed I her primary care physician since then, has not been read by GI. she did quit drinking a few times, and started drinking again about 2 years ago.  She was seen by PCP for routine checkup and was found to have low sodium, she was referred to Hospital and was admitted. She had abdominal ultrasound done on 05/31/2015 which showed liver cirrhosis and a questionable mass in the right lobe measuring 2 cm. She underwent a liver MRI on same day, which showed a 1.5 cm enhancing lesion in the right lobe of liver, and an additional 1.0 cm indeterminate lesion in the liver. AFP was found to be elevated at 8.4. No liver biopsy was performed. She was subsequently referred to our cancer center for further evaluation.  She lives with her son at home. She is able to do all her ADLs, and some light housework. She goes out for shopping with her daughters, does not drive. She denies any abdominal discomfort or bloating, no nausea, vomiting, diarrhea or constipation. Her appetite and energy level is moderate, unchanged from before. No recent weight loss.  CURRENT THERAPY: Surveillance  INTERIM HISTORY  Sarah Harmon returns for follow-up. She was last seen by me 6 months ago. She presents to the clinic today with her daughter.  She notes she is doing well. She notes going to the hospital in 08/2017 after increased dose of HTN medication caused an acute kidney injury. She has recovered since then. She denies abdominal issues and her BM are normal. She denies chest discomfort or other concerns. She last saw Dr. Kathlene Harmon in June. She sees Dr. Jenny Harmon her PCP in 12/2017. She is not taking any NSAID's. She does have a nephrologist who she follows up with. She has not gotten flu shot and does not want to get one.     MEDICAL HISTORY:  Past Medical History:  Diagnosis Date  . ALCOHOL ABUSE 07/13/2010  . ANXIETY 12/22/2009  . DEPRESSION 12/22/2009  . 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 DJD (degenerative joint  disease), cervical    patient denies on preop of 05/10/15   . FATIGUE 12/23/2010  . GALLSTONES 12/22/2009  . Gallstones   . Headache    occasional   . HYPERLIPIDEMIA 12/22/2009  . HYPERTENSION 12/22/2009  . HYPONATREMIA 12/22/2009  . 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 OSTEOPENIA 12/22/2009  . Pancytopenia 07/13/2010  . RENAL CYST, LEFT 12/22/2009  . VITAMIN D DEFICIENCY 12/23/2010  . WEIGHT LOSS 12/23/2010    SURGICAL HISTORY: Past Surgical History:  Procedure Laterality Date  . ABDOMINAL HYSTERECTOMY    . IR GENERIC HISTORICAL  10/18/2016   IR RADIOLOGIST EVAL & MGMT 10/18/2016 [name], MD [facility] INTERV RAD  . IR RADIOLOGIST EVAL & MGMT  05/30/2017    SOCIAL HISTORY: History   Social History  . Marital Status: Widowed    Spouse Name: N/A  . Number of Children: 5, she lives with one of her son   . Years of Education: N/A   Occupational History  . Retired    Social History Main Topics  . Smoking status: Never Smoker   . Smokeless tobacco: Not on file  . Alcohol Use: Yes  .  Drug Use: No  . Sexual Activity: Not on file   Other Topics Concern  . Not on file   Social History Narrative    FAMILY HISTORY: Family History  Problem Relation Age of Onset  . Lung cancer Brother   . Breast cancer Other   . Breast cancer Sister   . Gastric cancer Sister   . Lung cancer Sister   . Colon cancer Neg Hx   . Colon polyps Neg Hx   . Rectal cancer Neg Hx   . Stomach cancer Neg Hx   . Esophageal cancer Neg Hx     ALLERGIES:  has No Known Allergies.  MEDICATIONS:  Current Outpatient Medications  Medication Sig Dispense Refill  . ALPRAZolam (XANAX) 0.5 MG tablet TAKE ONE TABLET BY MOUTH AT BEDTIME 90 tablet 1  . amLODipine (NORVASC) 10 MG tablet Take 1 tablet (10 mg total) by mouth daily. 90 tablet 2  . Cholecalciferol (VITAMIN D3) 1000 UNITS CAPS Take 200 Units by mouth every morning.     . citalopram (CELEXA) 20 MG tablet TAKE ONE-HALF TABLET BY MOUTH ONCE DAILY 90 tablet 1  .  cloNIDine (CATAPRES) 0.1 MG tablet Take 1 tablet (0.1 mg total) by mouth daily. 60 tablet 1  . feeding supplement, ENSURE ENLIVE, (ENSURE ENLIVE) LIQD Take 237 mLs by mouth 3 (three) times daily between meals. 893 mL 12  . folic acid (FOLVITE) 1 MG tablet TAKE 1 TABLET BY MOUTH ONCE DAILY 90 tablet 0  . furosemide (LASIX) 40 MG tablet Take 1 tablet (40 mg total) by mouth daily. 30 tablet 0  . hydrALAZINE (APRESOLINE) 100 MG tablet TAKE ONE TABLET BY MOUTH THREE TIMES DAILY 270 tablet 2  . isosorbide dinitrate (ISORDIL) 30 MG tablet Take 1 tablet (30 mg total) by mouth 3 (three) times daily. 90 tablet 0  . lactulose (CHRONULAC) 10 GM/15ML solution Take 30 mLs (20 g total) by mouth daily. 473 mL 3  . propranolol (INDERAL) 40 MG tablet Take 1 tablet (40 mg total) by mouth 2 (two) times daily. 180 tablet 3   No current facility-administered medications for this visit.     REVIEW OF SYSTEMS:    Constitutional: Denies fevers, chills or abnormal night sweats Eyes: Denies blurriness of vision, double vision or watery eyes Ears, nose, mouth, throat, and face: Denies mucositis or sore throat Respiratory: Denies cough, dyspnea or wheezes Cardiovascular: Denies palpitation, chest discomfort or lower extremity swelling Gastrointestinal:  Denies nausea, heartburn or change in bowel habits Skin: Denies abnormal skin rashes Lymphatics: Denies new lymphadenopathy or easy bruising Neurological:Denies numbness, tingling or new weaknesses Behavioral/Psych: Mood is stable, no new changes  All other systems were reviewed with the patient and are negative.  PHYSICAL EXAMINATION: ECOG PERFORMANCE STATUS: 1 - Symptomatic but completely ambulatory  Vitals:   11/07/17 0859  BP: (!) 143/62  Pulse: 61  Resp: 18  Temp: 97.8 F (36.6 C)  SpO2: 100%   Filed Weights   11/07/17 0859  Weight: 121 lb 1.6 oz (54.9 kg)    GENERAL:alert, no distress and comfortable SKIN: skin color, texture, turgor are normal,  no rashes or significant lesions EYES: normal, conjunctiva are pink and non-injected, sclera clear OROPHARYNX:no exudate, no erythema and lips, buccal mucosa, and tongue normal  NECK: supple, thyroid normal size, non-tender, without nodularity LYMPH:  no palpable lymphadenopathy in the cervical, axillary or inguinal LUNGS: clear to auscultation and percussion with normal breathing effort HEART: regular rate & rhythm and no murmurs and no  lower extremity edema ABDOMEN:abdomen soft, non-tender and normal bowel sounds Musculoskeletal:no cyanosis of digits and no clubbing  PSYCH: alert & oriented x 3 with fluent speech NEURO: no focal motor/sensory deficits  LABORATORY DATA:  I have reviewed the data as listed CBC Latest Ref Rng & Units 10/31/2017 09/16/2017 09/15/2017  WBC 3.9 - 10.3 10e3/uL 4.0 9.0 7.4  Hemoglobin 11.6 - 15.9 g/dL 11.2(L) 8.2(L) 7.5(L)  Hematocrit 34.8 - 46.6 % 34.2(L) 24.3(L) 22.3(L)  Platelets 145 - 400 10e3/uL 123(L) 178 145(L)   CMP Latest Ref Rng & Units 10/31/2017 10/31/2017 09/17/2017  Glucose 70 - 140 mg/dl 92 - 90  BUN 7.0 - 26.0 mg/dL 27.4(H) - 67(H)  Creatinine 0.44 - 1.00 mg/dL 3.20(H) 3.2(HH) 3.28(H)  Sodium 136 - 145 mEq/L 137 - 128(L)  Potassium 3.5 - 5.1 mEq/L 4.5 - 3.9  Chloride 101 - 111 mmol/L - - 96(L)  CO2 22 - 29 mEq/L 24 - 23  Calcium 8.4 - 10.4 mg/dL 9.6 - 8.1(L)  Total Protein 6.4 - 8.3 g/dL 8.2 - -  Total Bilirubin 0.20 - 1.20 mg/dL 0.39 - -  Alkaline Phos 40 - 150 U/L 58 - -  AST 5 - 34 U/L 23 - -  ALT 0 - 55 U/L 14 - -   PROCEDURES:   Upper Endoscopy: 12/24/15 ENDOSCOPIC IMPRESSION: There was mild to moderate erosive gastritis (mid and distal stomach). This was not classic appearing for portal gastropathy. Biopsies were taken from antrum and body to check for H. pylori, portal gastropathy, GAVE. The examination was otherwise normal. There were no gastric or esophageal varices  Colonoscopy: 12/24/15 ENDOSCOPIC IMPRESSION:  1. Mild  diverticulosis was noted in the left colon 2. The examination was otherwise normal   RADIOGRAPHIC STUDIES: I have personally reviewed the radiological images as listed and agreed with the findings in the report.   MRI Abdomen WO Contrast 10/31/17  IMPRESSION: 1. Stable appearance of the ablation defect in segment VIII of the liver. No new liver abnormality on the current study. 2. Subtle nodularity of hepatic contour suggests cirrhosis. 3. Gallbladder distension with gallstone again noted lodged in the gallbladder neck/cystic duct. No associated biliary dilatation. 4. Stable appearance complex/septated cyst upper pole left kidney on this noncontrast exam. Lesion remains compatible with Bosniak II F lesion as previously characterized.   MRI Abdomen WO Contrast 05/15/17 IMPRESSION: Ablation zone in segment 8 is favored to have mildly decreased in size, now measuring 1.4 cm. This is incompletely evaluated in the absence of intravenous contrast. Otherwise, no focal hepatic lesion is seen. Suspected mild cirrhosis. 4.0 x 3.2 cm complex/septated left upper pole renal cyst, incompletely evaluated, but grossly unchanged.   MM SCREENING BREAST TOMO BILATERAL 04/18/17 IMPRESSION: No mammographic evidence of malignancy. A result letter of this screening mammogram will be mailed directly to the patient.  MRI ABD 09/06/16 IMPRESSION: 1. Minimal further size reduction at the ablation site, no recurrence or new focus of hepatocellular carcinoma identified. 2. Complex cystic lesion of the left kidney upper pole with some thin internal septations but no measurable enhancement, has minimally increased in size over the past 9 years. High likelihood that this is a benign lesion, categorized as Bosniak category IIF in the past. I would suggest ancillary surveillance at the time of liver follow up. 3. Mild chronic gallbladder wall thickening, with a stone lodged in the neck of the gallbladder.  Mild chronic gallbladder distention.   ASSESSMENT & PLAN:  71 y.o.  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  1. Stage I hepatocellular carcinoma  -I previously reviewed her liver MRI scan images with her and her family members in great detail. The 1.5 cm liver lesion in right lobe has typical arterial enhancement and venous washout, is consistent with HCC, in the background of liver cirrhosis. The other 1.0 centimeter lesion does not have typical arterial enhancement, and is indeterminate.  -Her liver MRI was diagnostic for [facility], she did not need liver biopsy  -She is now status post CT-guided microwave ablation by Dr. Kathlene Harmon in May 2016, and had a good response. -Her last restaging liver MRI in 08/2016 showed treatment effect, no recurrence or new lesions. -I encouraged her to continue surveillance with annual scans and check ups. She is over due for scan this year so will need to order scan in the next 2-3 weeks, due to her compromised kidney function, we may not be able to cure her contrast for MRI. -I reviewed her MRI abdomen from 10/29/17 shows no new changes and no evidence of disease progression. Plan to repeat MRI in 9-12 months from then.  -10/31/17 labs reviewed, her AFP is slightly elevated at 10.1. If tumor marker significantly increases we will repeat scan earlier.  -Repeat lab every 4-6 months  -I encouraged her to drink plenty of water, avoid NSAID's and control her blood pressure.  -Follow up in 6 months, plan to repeat liver MRI in 9-12 months if AFP stable   -I offered flu vaccination today, but she declined. I recommend she stay away from those who are sick.    2. 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 Ardis Hughs, last visit was March 2017, I encourage patient to call for  appointment.  3. CKD, stage IV -Cr. On 04/30/17 was 1.9 and got worse after. She was hospitalized for worsening renal function in September 2018.  Creatinine has been stable overall lately. -we discussed to avoid NSADs, iv contrast etc., due to her CKD   -Cr. Stable at 3.2 as of 10/31/17   4. Hypertension, poorly controlled -Her blood pressure was 229/103 in my office previously, asymptomatic. She did not take her blood pressure medication this morning.  -She will continue medication -I strongly encouraged her to monitor her blood pressure at home and follow-up with her primary care physician  PLAN: -Lab in 3 months to f/u AFP  -Lab and f/u in 6 months, plan to order repeated MRI scan on next visit     No orders of the defined types were placed in this encounter.   All questions were answered. The patient knows to call the clinic with any problems, questions or concerns. I spent 25 minutes counseling the patient face to face. The total time spent in the appointment was 30 minutes and more than 50% was on counseling.  This document serves as a record of services personally performed by Truitt Merle, MD. It was created on her behalf by Joslyn Devon, a trained medical scribe. The creation of this record is based on the scribe's personal observations and the provider's statements to them.   I have reviewed the above documentation for accuracy and completeness, and I agree with the above.     Truitt Merle, MD 11/07/2017

## 2017-11-07 ENCOUNTER — Encounter: Payer: Self-pay | Admitting: Hematology

## 2017-11-07 ENCOUNTER — Telehealth: Payer: Self-pay | Admitting: Hematology

## 2017-11-07 ENCOUNTER — Ambulatory Visit (HOSPITAL_BASED_OUTPATIENT_CLINIC_OR_DEPARTMENT_OTHER): Payer: Medicare Other | Admitting: Hematology

## 2017-11-07 ENCOUNTER — Telehealth: Payer: Self-pay | Admitting: Internal Medicine

## 2017-11-07 VITALS — BP 143/62 | HR 61 | Temp 97.8°F | Resp 18 | Ht 63.0 in | Wt 121.1 lb

## 2017-11-07 DIAGNOSIS — N184 Chronic kidney disease, stage 4 (severe): Secondary | ICD-10-CM | POA: Diagnosis not present

## 2017-11-07 DIAGNOSIS — K7030 Alcoholic cirrhosis of liver without ascites: Secondary | ICD-10-CM | POA: Diagnosis not present

## 2017-11-07 DIAGNOSIS — D61818 Other pancytopenia: Secondary | ICD-10-CM

## 2017-11-07 DIAGNOSIS — C220 Liver cell carcinoma: Secondary | ICD-10-CM

## 2017-11-07 NOTE — Telephone Encounter (Signed)
Scheduled appt per 11/21 los - Gave patient AVS and calender per los.  

## 2017-11-07 NOTE — Telephone Encounter (Signed)
States patient missed skilled nursing visit last week due to not being able to contact patient.

## 2017-11-12 NOTE — Telephone Encounter (Signed)
FYI

## 2017-11-14 DIAGNOSIS — I129 Hypertensive chronic kidney disease with stage 1 through stage 4 chronic kidney disease, or unspecified chronic kidney disease: Secondary | ICD-10-CM | POA: Diagnosis not present

## 2017-11-14 DIAGNOSIS — I160 Hypertensive urgency: Secondary | ICD-10-CM | POA: Diagnosis not present

## 2017-11-14 DIAGNOSIS — D631 Anemia in chronic kidney disease: Secondary | ICD-10-CM | POA: Diagnosis not present

## 2017-11-14 DIAGNOSIS — N183 Chronic kidney disease, stage 3 (moderate): Secondary | ICD-10-CM | POA: Diagnosis not present

## 2017-11-14 DIAGNOSIS — G9341 Metabolic encephalopathy: Secondary | ICD-10-CM | POA: Diagnosis not present

## 2017-11-14 DIAGNOSIS — K7460 Unspecified cirrhosis of liver: Secondary | ICD-10-CM | POA: Diagnosis not present

## 2017-11-15 DIAGNOSIS — I129 Hypertensive chronic kidney disease with stage 1 through stage 4 chronic kidney disease, or unspecified chronic kidney disease: Secondary | ICD-10-CM | POA: Diagnosis not present

## 2017-11-15 DIAGNOSIS — N183 Chronic kidney disease, stage 3 (moderate): Secondary | ICD-10-CM | POA: Diagnosis not present

## 2017-11-15 DIAGNOSIS — D631 Anemia in chronic kidney disease: Secondary | ICD-10-CM | POA: Diagnosis not present

## 2017-11-15 DIAGNOSIS — G9341 Metabolic encephalopathy: Secondary | ICD-10-CM | POA: Diagnosis not present

## 2017-11-15 DIAGNOSIS — I160 Hypertensive urgency: Secondary | ICD-10-CM | POA: Diagnosis not present

## 2017-11-15 DIAGNOSIS — K7460 Unspecified cirrhosis of liver: Secondary | ICD-10-CM | POA: Diagnosis not present

## 2017-11-20 ENCOUNTER — Other Ambulatory Visit: Payer: Self-pay | Admitting: Internal Medicine

## 2017-12-23 ENCOUNTER — Other Ambulatory Visit: Payer: Self-pay | Admitting: Internal Medicine

## 2017-12-24 NOTE — Telephone Encounter (Signed)
Xanax done erx

## 2017-12-26 ENCOUNTER — Encounter: Payer: Self-pay | Admitting: Internal Medicine

## 2017-12-26 ENCOUNTER — Ambulatory Visit (INDEPENDENT_AMBULATORY_CARE_PROVIDER_SITE_OTHER): Payer: Medicare Other | Admitting: Internal Medicine

## 2017-12-26 VITALS — BP 110/64 | HR 60 | Temp 97.7°F | Ht 63.0 in | Wt 120.0 lb

## 2017-12-26 DIAGNOSIS — R7302 Impaired glucose tolerance (oral): Secondary | ICD-10-CM | POA: Diagnosis not present

## 2017-12-26 DIAGNOSIS — I10 Essential (primary) hypertension: Secondary | ICD-10-CM

## 2017-12-26 DIAGNOSIS — E785 Hyperlipidemia, unspecified: Secondary | ICD-10-CM

## 2017-12-26 DIAGNOSIS — N183 Chronic kidney disease, stage 3 unspecified: Secondary | ICD-10-CM

## 2017-12-26 NOTE — Progress Notes (Signed)
Subjective:    Patient ID: Sarah Harmon, female    DOB: 22-Jun-1946, 72 y.o.   MRN: [id number]  HPI    Here to f/u; overall doing ok,  Pt denies chest pain, increasing sob or doe, wheezing, orthopnea, PND, increased LE swelling, palpitations, dizziness or syncope.  Pt denies new neurological symptoms such as new headache, or facial or extremity weakness or numbness.  Pt denies polydipsia, polyuria, or low sugar episode.  Pt states overall good compliance with meds, mostly trying to follow appropriate diet, with wt overall stable.  Declines flu shot and shingles. occas marijuana use.  Has appt for renal f/u next mo.  Remains ETOH abstinent  No new complaints or interval hx Past Medical History:  Diagnosis Date  . ALCOHOL ABUSE 07/13/2010  . ANXIETY 12/22/2009  . DEPRESSION 12/22/2009  . 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 DJD (degenerative joint disease), cervical    patient denies on preop of 05/10/15   . FATIGUE 12/23/2010  . GALLSTONES 12/22/2009  . Gallstones   . Headache    occasional   . HYPERLIPIDEMIA 12/22/2009  . HYPERTENSION 12/22/2009  . HYPONATREMIA 12/22/2009  . 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 OSTEOPENIA 12/22/2009  . Pancytopenia 07/13/2010  . RENAL CYST, LEFT 12/22/2009  . VITAMIN D DEFICIENCY 12/23/2010  . WEIGHT LOSS 12/23/2010   Past Surgical History:  Procedure Laterality Date  . ABDOMINAL HYSTERECTOMY    . IR GENERIC HISTORICAL  10/18/2016   IR RADIOLOGIST EVAL & MGMT 10/18/2016 [name], MD [facility] INTERV RAD  . IR RADIOLOGIST EVAL & MGMT  05/30/2017    reports that  has never smoked. she has never used smokeless tobacco. She reports that she drinks about 1.8 - 2.4 oz of alcohol per week. She reports that she does not use drugs. family history includes Breast cancer in her other and sister; Gastric cancer in her sister; Lung cancer in her brother and sister. No Known Allergies Current Outpatient Medications on File Prior to Visit  Medication Sig Dispense Refill    . ALPRAZolam (XANAX) 0.5 MG tablet TAKE 1 TABLET BY MOUTH ONCE DAILY AT BEDTIME 30 tablet 2  . amLODipine (NORVASC) 10 MG tablet Take 1 tablet (10 mg total) by mouth daily. 90 tablet 2  . Cholecalciferol (VITAMIN D3) 1000 UNITS CAPS Take 200 Units by mouth every morning.     . citalopram (CELEXA) 20 MG tablet TAKE ONE-HALF TABLET BY MOUTH ONCE DAILY 90 tablet 1  . cloNIDine (CATAPRES) 0.1 MG tablet Take 1 tablet (0.1 mg total) by mouth daily. 60 tablet 1  . feeding supplement, ENSURE ENLIVE, (ENSURE ENLIVE) LIQD Take 237 mLs by mouth 3 (three) times daily between meals. 811 mL 12  . folic acid (FOLVITE) 1 MG tablet TAKE 1 TABLET BY MOUTH ONCE DAILY 90 tablet 0  . furosemide (LASIX) 40 MG tablet Take 1 tablet (40 mg total) by mouth daily. 30 tablet 0  . hydrALAZINE (APRESOLINE) 100 MG tablet TAKE ONE TABLET BY MOUTH THREE TIMES DAILY 270 tablet 2  . isosorbide dinitrate (ISORDIL) 30 MG tablet Take 1 tablet (30 mg total) by mouth 3 (three) times daily. 90 tablet 0  . lactulose (CHRONULAC) 10 GM/15ML solution Take 30 mLs (20 g total) by mouth daily. 473 mL 3  . propranolol (INDERAL) 40 MG tablet Take 1 tablet (40 mg total) by mouth 2 (two) times daily. 180 tablet 3   No current facility-administered medications on file prior to visit.  Review of Systems  Constitutional: Negative for other unusual diaphoresis or sweats HENT: Negative for ear discharge or swelling Eyes: Negative for other worsening visual disturbances Respiratory: Negative for stridor or other swelling  Gastrointestinal: Negative for worsening distension or other blood Genitourinary: Negative for retention or other urinary change Musculoskeletal: Negative for other MSK pain or swelling Skin: Negative for color change or other new lesions Neurological: Negative for worsening tremors and other numbness  Psychiatric/Behavioral: Negative for worsening agitation or other fatigue All other system neg per pt    Objective:    Physical Exam BP 110/64   Pulse 60   Temp 97.7 F (36.5 C) (Oral)   Ht 5\' 3"  (1.6 m)   Wt 120 lb (54.4 kg)   SpO2 100%   BMI 21.26 kg/m  VS noted,  Constitutional: Pt appears in NAD HENT: Head: NCAT.  Right Ear: External ear normal.  Left Ear: External ear normal.  Eyes: . Pupils are equal, round, and reactive to light. Conjunctivae and EOM are normal Nose: without d/c or deformity Neck: Neck supple. Gross normal ROM Cardiovascular: Normal rate and regular rhythm.   Pulmonary/Chest: Effort normal and breath sounds without rales or wheezing.  Abd:  Soft, NT, ND, + BS, Neurological: Pt is alert. At baseline orientation, motor grossly intact Skin: Skin is warm. No rashes, other new lesions, no LE edema Psychiatric: Pt behavior is normal without agitation  No other exam findings  Lab Results  Component Value Date   WBC 4.0 10/31/2017   HGB 11.2 (L) 10/31/2017   HCT 34.2 (L) 10/31/2017   PLT 123 (L) 10/31/2017   GLUCOSE 92 10/31/2017   CHOL 243 (H) 09/04/2017   TRIG 233.0 (H) 09/04/2017   HDL 128.30 09/04/2017   LDLDIRECT 85.0 09/04/2017   LDLCALC 77 02/28/2017   ALT 14 10/31/2017   AST 23 10/31/2017   NA 137 10/31/2017   K 4.5 10/31/2017   CL 96 (L) 09/17/2017   CREATININE 3.20 (H) 10/31/2017   BUN 27.4 (H) 10/31/2017   CO2 24 10/31/2017   TSH 1.58 09/04/2017   INR 1.11 09/09/2017   HGBA1C 5.7 02/28/2017       Assessment & Plan:

## 2017-12-26 NOTE — Patient Instructions (Signed)
Please continue all other medications as before, and refills have been done if requested.  Please have the pharmacy call with any other refills you may need.  Please continue your efforts at being more active, low cholesterol diet, and weight control.  You are otherwise up to date with prevention measures today.  Please keep your appointments with your specialists as you may have planned   

## 2017-12-28 NOTE — Assessment & Plan Note (Signed)
Lab Results  Component Value Date   CREATININE 3.20 (H) 10/31/2017  stable overall by history and exam, recent data reviewed with pt, and pt to continue medical treatment as before,  to f/u any worsening symptoms or concerns

## 2017-12-28 NOTE — Assessment & Plan Note (Signed)
stable overall by history and exam, recent data reviewed with pt, and pt to continue medical treatment as before,  to f/u any worsening symptoms or concerns Lab Results  Component Value Date   LDLCALC 77 02/28/2017

## 2017-12-28 NOTE — Assessment & Plan Note (Signed)
BP Readings from Last 3 Encounters:  12/26/17 110/64  11/07/17 (!) 143/62  09/25/17 118/64  stable overall by history and exam, recent data reviewed with pt, and pt to continue medical treatment as before,  to f/u any worsening symptoms or concerns

## 2017-12-28 NOTE — Assessment & Plan Note (Signed)
stable overall by history and exam, recent data reviewed with pt, and pt to continue medical treatment as before,  to f/u any worsening symptoms or concerns Lab Results  Component Value Date   HGBA1C 5.7 02/28/2017

## 2018-01-13 ENCOUNTER — Other Ambulatory Visit: Payer: Self-pay | Admitting: Internal Medicine

## 2018-01-30 DIAGNOSIS — I129 Hypertensive chronic kidney disease with stage 1 through stage 4 chronic kidney disease, or unspecified chronic kidney disease: Secondary | ICD-10-CM | POA: Diagnosis not present

## 2018-01-30 DIAGNOSIS — N184 Chronic kidney disease, stage 4 (severe): Secondary | ICD-10-CM | POA: Diagnosis not present

## 2018-01-30 DIAGNOSIS — D631 Anemia in chronic kidney disease: Secondary | ICD-10-CM | POA: Diagnosis not present

## 2018-02-06 ENCOUNTER — Other Ambulatory Visit: Payer: Self-pay | Admitting: Nurse Practitioner

## 2018-02-07 ENCOUNTER — Inpatient Hospital Stay: Payer: Medicare Other | Attending: Hematology

## 2018-02-07 DIAGNOSIS — C220 Liver cell carcinoma: Secondary | ICD-10-CM | POA: Insufficient documentation

## 2018-02-07 LAB — COMPREHENSIVE METABOLIC PANEL
ALK PHOS: 48 U/L (ref 40–150)
ALT: 14 U/L (ref 0–55)
AST: 20 U/L (ref 5–34)
Albumin: 3.4 g/dL — ABNORMAL LOW (ref 3.5–5.0)
Anion gap: 12 — ABNORMAL HIGH (ref 3–11)
BUN: 45 mg/dL — ABNORMAL HIGH (ref 7–26)
CALCIUM: 9.8 mg/dL (ref 8.4–10.4)
CO2: 17 mmol/L — ABNORMAL LOW (ref 22–29)
CREATININE: 2.94 mg/dL — AB (ref 0.60–1.10)
Chloride: 108 mmol/L (ref 98–109)
GFR, EST AFRICAN AMERICAN: 17 mL/min — AB (ref >60)
GFR, EST NON AFRICAN AMERICAN: 15 mL/min — AB (ref >60)
Glucose, Bld: 107 mg/dL (ref 70–140)
Potassium: 3.8 mmol/L (ref 3.5–5.1)
Sodium: 137 mmol/L (ref 136–145)
Total Bilirubin: 0.4 mg/dL (ref 0.2–1.2)
Total Protein: 8.1 g/dL (ref 6.4–8.3)

## 2018-02-07 LAB — CBC WITH DIFFERENTIAL/PLATELET
BASOS PCT: 1 %
Basophils Absolute: 0.1 10*3/uL (ref 0.0–0.1)
EOS ABS: 0.3 10*3/uL (ref 0.0–0.5)
Eosinophils Relative: 6 %
HCT: 30.3 % — ABNORMAL LOW (ref 34.8–46.6)
Hemoglobin: 9.9 g/dL — ABNORMAL LOW (ref 11.6–15.9)
Lymphocytes Relative: 20 %
Lymphs Abs: 1.1 10*3/uL (ref 0.9–3.3)
MCH: 28.5 pg (ref 25.1–34.0)
MCHC: 32.8 g/dL (ref 31.5–36.0)
MCV: 86.8 fL (ref 79.5–101.0)
MONOS PCT: 7 %
Monocytes Absolute: 0.4 10*3/uL (ref 0.1–0.9)
NEUTROS PCT: 66 %
Neutro Abs: 3.7 10*3/uL (ref 1.5–6.5)
PLATELETS: 167 10*3/uL (ref 145–400)
RBC: 3.49 MIL/uL — ABNORMAL LOW (ref 3.70–5.45)
RDW: 16.2 % — AB (ref 11.2–14.5)
WBC: 5.7 10*3/uL (ref 3.9–10.3)

## 2018-02-08 LAB — AFP TUMOR MARKER: AFP, Serum, Tumor Marker: 10 ng/mL — ABNORMAL HIGH (ref 0.0–8.3)

## 2018-02-12 ENCOUNTER — Telehealth: Payer: Self-pay | Admitting: *Deleted

## 2018-02-12 NOTE — Telephone Encounter (Signed)
-----   Message from Truitt Merle, MD sent at 02/10/2018  4:29 PM EST ----- Please lt pt know the lab result, her AFP is stable, no new concerns.  Truitt Merle  02/10/2018

## 2018-02-12 NOTE — Telephone Encounter (Signed)
Called & spoke with pt's daughter, Wynn Banker & informed of Dr Ernestina Penna message.  She expressed understanding & appreciation of call.

## 2018-02-14 ENCOUNTER — Other Ambulatory Visit: Payer: Self-pay | Admitting: Internal Medicine

## 2018-03-11 ENCOUNTER — Other Ambulatory Visit: Payer: Self-pay | Admitting: Internal Medicine

## 2018-03-21 DIAGNOSIS — D631 Anemia in chronic kidney disease: Secondary | ICD-10-CM | POA: Diagnosis not present

## 2018-03-21 DIAGNOSIS — I129 Hypertensive chronic kidney disease with stage 1 through stage 4 chronic kidney disease, or unspecified chronic kidney disease: Secondary | ICD-10-CM | POA: Diagnosis not present

## 2018-03-21 DIAGNOSIS — N184 Chronic kidney disease, stage 4 (severe): Secondary | ICD-10-CM | POA: Diagnosis not present

## 2018-03-26 ENCOUNTER — Other Ambulatory Visit: Payer: Self-pay | Admitting: Internal Medicine

## 2018-03-27 NOTE — Telephone Encounter (Signed)
Done erx 

## 2018-03-27 NOTE — Telephone Encounter (Signed)
02/21/2018 30# 

## 2018-04-09 ENCOUNTER — Other Ambulatory Visit: Payer: Self-pay | Admitting: Internal Medicine

## 2018-04-16 ENCOUNTER — Other Ambulatory Visit: Payer: Self-pay | Admitting: Internal Medicine

## 2018-05-06 ENCOUNTER — Encounter (HOSPITAL_COMMUNITY): Payer: Self-pay | Admitting: Emergency Medicine

## 2018-05-06 ENCOUNTER — Other Ambulatory Visit: Payer: Medicare Other

## 2018-05-06 ENCOUNTER — Ambulatory Visit: Payer: Medicare Other | Admitting: Hematology

## 2018-05-06 ENCOUNTER — Inpatient Hospital Stay (HOSPITAL_COMMUNITY)
Admission: EM | Admit: 2018-05-06 | Discharge: 2018-05-09 | DRG: 640 | Disposition: A | Discharge status: Home / Self Care | Payer: Medicare Other | Attending: Internal Medicine | Admitting: Internal Medicine

## 2018-05-06 ENCOUNTER — Other Ambulatory Visit: Payer: Self-pay

## 2018-05-06 DIAGNOSIS — F329 Major depressive disorder, single episode, unspecified: Secondary | ICD-10-CM | POA: Diagnosis present

## 2018-05-06 DIAGNOSIS — E871 Hypo-osmolality and hyponatremia: Secondary | ICD-10-CM | POA: Diagnosis not present

## 2018-05-06 DIAGNOSIS — R001 Bradycardia, unspecified: Secondary | ICD-10-CM | POA: Diagnosis present

## 2018-05-06 DIAGNOSIS — D631 Anemia in chronic kidney disease: Secondary | ICD-10-CM

## 2018-05-06 DIAGNOSIS — K7030 Alcoholic cirrhosis of liver without ascites: Secondary | ICD-10-CM | POA: Diagnosis present

## 2018-05-06 DIAGNOSIS — N179 Acute kidney failure, unspecified: Secondary | ICD-10-CM | POA: Diagnosis not present

## 2018-05-06 DIAGNOSIS — R627 Adult failure to thrive: Secondary | ICD-10-CM | POA: Diagnosis present

## 2018-05-06 DIAGNOSIS — I160 Hypertensive urgency: Secondary | ICD-10-CM | POA: Diagnosis present

## 2018-05-06 DIAGNOSIS — Z681 Body mass index (BMI) 19 or less, adult: Secondary | ICD-10-CM | POA: Diagnosis not present

## 2018-05-06 DIAGNOSIS — F419 Anxiety disorder, unspecified: Secondary | ICD-10-CM | POA: Diagnosis present

## 2018-05-06 DIAGNOSIS — I4891 Unspecified atrial fibrillation: Secondary | ICD-10-CM | POA: Diagnosis present

## 2018-05-06 DIAGNOSIS — I10 Essential (primary) hypertension: Secondary | ICD-10-CM | POA: Diagnosis not present

## 2018-05-06 DIAGNOSIS — E861 Hypovolemia: Secondary | ICD-10-CM | POA: Diagnosis present

## 2018-05-06 DIAGNOSIS — Z79899 Other long term (current) drug therapy: Secondary | ICD-10-CM

## 2018-05-06 DIAGNOSIS — N183 Chronic kidney disease, stage 3 (moderate): Secondary | ICD-10-CM | POA: Diagnosis not present

## 2018-05-06 DIAGNOSIS — N184 Chronic kidney disease, stage 4 (severe): Secondary | ICD-10-CM | POA: Diagnosis not present

## 2018-05-06 DIAGNOSIS — N186 End stage renal disease: Secondary | ICD-10-CM | POA: Diagnosis not present

## 2018-05-06 DIAGNOSIS — D61818 Other pancytopenia: Secondary | ICD-10-CM | POA: Diagnosis present

## 2018-05-06 DIAGNOSIS — Z8505 Personal history of malignant neoplasm of liver: Secondary | ICD-10-CM

## 2018-05-06 DIAGNOSIS — E785 Hyperlipidemia, unspecified: Secondary | ICD-10-CM | POA: Diagnosis present

## 2018-05-06 DIAGNOSIS — I129 Hypertensive chronic kidney disease with stage 1 through stage 4 chronic kidney disease, or unspecified chronic kidney disease: Secondary | ICD-10-CM | POA: Diagnosis not present

## 2018-05-06 DIAGNOSIS — N189 Chronic kidney disease, unspecified: Secondary | ICD-10-CM | POA: Diagnosis not present

## 2018-05-06 DIAGNOSIS — D638 Anemia in other chronic diseases classified elsewhere: Secondary | ICD-10-CM | POA: Diagnosis present

## 2018-05-06 DIAGNOSIS — E872 Acidosis: Secondary | ICD-10-CM | POA: Diagnosis present

## 2018-05-06 DIAGNOSIS — D696 Thrombocytopenia, unspecified: Secondary | ICD-10-CM | POA: Diagnosis not present

## 2018-05-06 DIAGNOSIS — I120 Hypertensive chronic kidney disease with stage 5 chronic kidney disease or end stage renal disease: Secondary | ICD-10-CM | POA: Diagnosis present

## 2018-05-06 DIAGNOSIS — E875 Hyperkalemia: Principal | ICD-10-CM | POA: Diagnosis present

## 2018-05-06 DIAGNOSIS — I739 Peripheral vascular disease, unspecified: Secondary | ICD-10-CM | POA: Diagnosis not present

## 2018-05-06 DIAGNOSIS — N185 Chronic kidney disease, stage 5: Secondary | ICD-10-CM | POA: Diagnosis not present

## 2018-05-06 DIAGNOSIS — Z01810 Encounter for preprocedural cardiovascular examination: Secondary | ICD-10-CM | POA: Diagnosis not present

## 2018-05-06 LAB — CBC
HCT: 28.3 % — ABNORMAL LOW (ref 36.0–46.0)
HEMOGLOBIN: 9.3 g/dL — AB (ref 12.0–15.0)
MCH: 28.8 pg (ref 26.0–34.0)
MCHC: 32.9 g/dL (ref 30.0–36.0)
MCV: 87.6 fL (ref 78.0–100.0)
Platelets: 145 10*3/uL — ABNORMAL LOW (ref 150–400)
RBC: 3.23 MIL/uL — AB (ref 3.87–5.11)
RDW: 12.9 % (ref 11.5–15.5)
WBC: 3.6 10*3/uL — AB (ref 4.0–10.5)

## 2018-05-06 LAB — BASIC METABOLIC PANEL
Anion gap: 9 (ref 5–15)
BUN: 42 mg/dL — AB (ref 6–20)
CHLORIDE: 93 mmol/L — AB (ref 101–111)
CO2: 21 mmol/L — AB (ref 22–32)
CREATININE: 3.67 mg/dL — AB (ref 0.44–1.00)
Calcium: 9.5 mg/dL (ref 8.9–10.3)
GFR calc non Af Amer: 11 mL/min — ABNORMAL LOW (ref >60)
GFR, EST AFRICAN AMERICAN: 13 mL/min — AB (ref >60)
Glucose, Bld: 118 mg/dL — ABNORMAL HIGH (ref 65–99)
POTASSIUM: 6.7 mmol/L — AB (ref 3.5–5.1)
SODIUM: 123 mmol/L — AB (ref 135–145)

## 2018-05-06 LAB — OSMOLALITY: OSMOLALITY: 281 mosm/kg (ref 275–295)

## 2018-05-06 MED ORDER — SODIUM POLYSTYRENE SULFONATE 15 GM/60ML PO SUSP
30.0000 g | Freq: Once | ORAL | Status: AC
  Administered 2018-05-06: 30 g via ORAL
  Filled 2018-05-06: qty 120

## 2018-05-06 MED ORDER — VITAMIN D3 25 MCG (1000 UT) PO CAPS: 200.0000 [IU] | ORAL_CAPSULE | Freq: Every morning | ORAL | Status: DC

## 2018-05-06 MED ORDER — FOLIC ACID 1 MG PO TABS
1.0000 mg | ORAL_TABLET | Freq: Every day | ORAL | Status: DC
  Administered 2018-05-07 – 2018-05-09 (×3): 1 mg via ORAL
  Filled 2018-05-06 (×3): qty 1

## 2018-05-06 MED ORDER — ENOXAPARIN SODIUM 40 MG/0.4ML ~~LOC~~ SOLN: 40.0000 mg | SUBCUTANEOUS | Status: DC

## 2018-05-06 MED ORDER — HYDRALAZINE HCL 25 MG PO TABS
100.0000 mg | ORAL_TABLET | Freq: Three times a day (TID) | ORAL | Status: DC
  Administered 2018-05-07 – 2018-05-09 (×8): 100 mg via ORAL
  Filled 2018-05-06 (×6): qty 4
  Filled 2018-05-06: qty 2
  Filled 2018-05-06 (×3): qty 4

## 2018-05-06 MED ORDER — ISOSORBIDE DINITRATE 30 MG PO TABS
30.0000 mg | ORAL_TABLET | Freq: Three times a day (TID) | ORAL | Status: DC
  Administered 2018-05-07 – 2018-05-09 (×9): 30 mg via ORAL
  Filled 2018-05-06 (×12): qty 1

## 2018-05-06 MED ORDER — SODIUM BICARBONATE 8.4 % IV SOLN
50.0000 meq | Freq: Once | INTRAVENOUS | Status: AC
  Administered 2018-05-06: 50 meq via INTRAVENOUS
  Filled 2018-05-06: qty 50

## 2018-05-06 MED ORDER — CLONIDINE HCL 0.1 MG PO TABS
0.1000 mg | ORAL_TABLET | Freq: Once | ORAL | Status: AC
  Administered 2018-05-06: 0.1 mg via ORAL
  Filled 2018-05-06: qty 1

## 2018-05-06 MED ORDER — ONDANSETRON HCL 4 MG/2ML IJ SOLN: 4.0000 mg | Freq: Four times a day (QID) | INTRAMUSCULAR | Status: DC | PRN

## 2018-05-06 MED ORDER — DEXTROSE 50 % IV SOLN
1.0000 | Freq: Once | INTRAVENOUS | Status: AC
  Administered 2018-05-06: 50 mL via INTRAVENOUS
  Filled 2018-05-06: qty 50

## 2018-05-06 MED ORDER — ONDANSETRON HCL 4 MG PO TABS: 4.0000 mg | ORAL_TABLET | Freq: Four times a day (QID) | ORAL | Status: DC | PRN

## 2018-05-06 MED ORDER — INSULIN ASPART 100 UNIT/ML ~~LOC~~ SOLN
5.0000 [IU] | Freq: Once | SUBCUTANEOUS | Status: AC
  Administered 2018-05-06: 5 [IU] via INTRAVENOUS
  Filled 2018-05-06: qty 1

## 2018-05-06 MED ORDER — HEPARIN SODIUM (PORCINE) 5000 UNIT/ML IJ SOLN
5000.0000 [IU] | Freq: Three times a day (TID) | INTRAMUSCULAR | Status: DC
  Administered 2018-05-07 – 2018-05-08 (×5): 5000 [IU] via SUBCUTANEOUS
  Filled 2018-05-06 (×5): qty 1

## 2018-05-06 MED ORDER — PROPRANOLOL HCL 10 MG PO TABS
40.0000 mg | ORAL_TABLET | Freq: Two times a day (BID) | ORAL | Status: DC
  Administered 2018-05-07 – 2018-05-09 (×5): 40 mg via ORAL
  Filled 2018-05-06 (×2): qty 1
  Filled 2018-05-06 (×2): qty 4
  Filled 2018-05-06 (×2): qty 1
  Filled 2018-05-06: qty 4

## 2018-05-06 MED ORDER — FUROSEMIDE 10 MG/ML IJ SOLN
40.0000 mg | Freq: Once | INTRAMUSCULAR | Status: AC
  Administered 2018-05-06: 40 mg via INTRAVENOUS
  Filled 2018-05-06: qty 4

## 2018-05-06 MED ORDER — LACTULOSE 10 GM/15ML PO SOLN
20.0000 g | Freq: Every day | ORAL | Status: DC
  Administered 2018-05-07 – 2018-05-09 (×3): 20 g via ORAL
  Filled 2018-05-06 (×4): qty 30

## 2018-05-06 MED ORDER — FUROSEMIDE 40 MG PO TABS
40.0000 mg | ORAL_TABLET | Freq: Every day | ORAL | Status: DC
  Administered 2018-05-07 – 2018-05-08 (×2): 40 mg via ORAL
  Filled 2018-05-06 (×2): qty 1

## 2018-05-06 MED ORDER — SODIUM CHLORIDE 0.9 % IV SOLN
1.0000 g | Freq: Once | INTRAVENOUS | Status: AC
  Administered 2018-05-06: 1 g via INTRAVENOUS
  Filled 2018-05-06: qty 10

## 2018-05-06 MED ORDER — CITALOPRAM HYDROBROMIDE 20 MG PO TABS
10.0000 mg | ORAL_TABLET | Freq: Every day | ORAL | Status: DC
  Administered 2018-05-07 – 2018-05-09 (×3): 10 mg via ORAL
  Filled 2018-05-06 (×3): qty 1

## 2018-05-06 MED ORDER — AMLODIPINE BESYLATE 10 MG PO TABS
10.0000 mg | ORAL_TABLET | Freq: Every day | ORAL | Status: DC
  Administered 2018-05-07 – 2018-05-09 (×3): 10 mg via ORAL
  Filled 2018-05-06 (×3): qty 1

## 2018-05-06 MED ORDER — SODIUM CHLORIDE 0.9 % IV BOLUS
500.0000 mL | Freq: Once | INTRAVENOUS | Status: AC
  Administered 2018-05-06: 500 mL via INTRAVENOUS

## 2018-05-06 MED ORDER — ENSURE ENLIVE PO LIQD: 237.0000 mL | Freq: Three times a day (TID) | ORAL | Status: DC

## 2018-05-06 MED ORDER — ALPRAZOLAM 0.5 MG PO TABS
0.5000 mg | ORAL_TABLET | Freq: Every day | ORAL | Status: DC
  Administered 2018-05-06 – 2018-05-08 (×3): 0.5 mg via ORAL
  Filled 2018-05-06: qty 1
  Filled 2018-05-06: qty 2
  Filled 2018-05-06: qty 1

## 2018-05-06 MED ORDER — CLONIDINE HCL 0.1 MG PO TABS
0.1000 mg | ORAL_TABLET | Freq: Every day | ORAL | Status: DC
  Administered 2018-05-07 – 2018-05-09 (×3): 0.1 mg via ORAL
  Filled 2018-05-06 (×3): qty 1

## 2018-05-06 NOTE — H&P (Signed)
History and Physical    Sarah Harmon ZOX:[id number] DOB: [birth year] DOA: 05/06/2018  Referring MD/NP/PA: Dr. Lacinda Axon  PCP: Biagio Borg, MD    Patient coming from: home  Chief Complaint: generalized weakness  HPI: Sarah Harmon is a 72 y.o. female with medical history significant of chronic kidney disease stage III, history of alcohol Abuse, history of hepatocellular carcinoma, with previous CT-guided thermal ablation due to alcohol abuse, who has been battling chronic kidney disease but came to the ER with generalized weakness. Patient was found to have a potassium of 6.7. Worsening renal function and generalized debility. She has significant and control hypertension. Nephrology has been consult to patient is scheduled for possibly initiating hemodialysis.  ED Course: patient's temperature is fine but blood pressures to 18/58 her pulse is as low as 45 respiratory rate of 15 and oxygen sat 100% room air. She has a white count of 3.6 hemoglobin 9.3 and platelet 145 sodium is 123 with potassium 6.7 BUN 42 and chronic 3.67  Review of Systems: As per HPI otherwise 10 point review of systems negative.    Past Medical History:  Diagnosis Date  . ALCOHOL ABUSE 07/13/2010  . ANXIETY 12/22/2009  . DEPRESSION 12/22/2009  . 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 DJD (degenerative joint disease), cervical    patient denies on preop of 05/10/15   . FATIGUE 12/23/2010  . GALLSTONES 12/22/2009  . Gallstones   . Headache    occasional   . HYPERLIPIDEMIA 12/22/2009  . HYPERTENSION 12/22/2009  . HYPONATREMIA 12/22/2009  . 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 OSTEOPENIA 12/22/2009  . Pancytopenia 07/13/2010  . RENAL CYST, LEFT 12/22/2009  . VITAMIN D DEFICIENCY 12/23/2010  . WEIGHT LOSS 12/23/2010    Past Surgical History:  Procedure Laterality Date  . ABDOMINAL HYSTERECTOMY    . IR GENERIC HISTORICAL  10/18/2016   IR RADIOLOGIST EVAL & MGMT 10/18/2016 [name], MD [facility] INTERV RAD  . IR  RADIOLOGIST EVAL & MGMT  05/30/2017     reports that she has never smoked. She has never used smokeless tobacco. She reports that she drinks about 1.8 - 2.4 oz of alcohol per week. She reports that she does not use drugs.  No Known Allergies  Family History  Problem Relation Age of Onset  . Lung cancer Brother   . Breast cancer Other   . Breast cancer Sister   . Gastric cancer Sister   . Lung cancer Sister   . Colon cancer Neg Hx   . Colon polyps Neg Hx   . Rectal cancer Neg Hx   . Stomach cancer Neg Hx   . Esophageal cancer Neg Hx     Prior to Admission medications   Medication Sig Start Date End Date Taking? Authorizing Provider  ALPRAZolam Duanne Moron) 0.5 MG tablet TAKE 1 TABLET BY MOUTH ONCE DAILY AT BEDTIME 03/27/18   Biagio Borg, MD  amLODipine (NORVASC) 10 MG tablet TAKE 1 TABLET BY MOUTH ONCE DAILY 03/11/18   Biagio Borg, MD  Cholecalciferol (VITAMIN D3) 1000 UNITS CAPS Take 200 Units by mouth every morning.     [provider]  citalopram (CELEXA) 20 MG tablet TAKE ONE-HALF TABLET BY MOUTH ONCE DAILY 02/14/18   Biagio Borg, MD  cloNIDine (CATAPRES) 0.1 MG tablet Take 1 tablet (0.1 mg total) by mouth daily. 09/18/17   Hosie Poisson, MD  feeding supplement, ENSURE ENLIVE, (ENSURE ENLIVE) LIQD Take 237 mLs by mouth 3 (three) times daily between  meals. 09/17/17   Hosie Poisson, MD  folic acid (FOLVITE) 1 MG tablet TAKE 1 TABLET BY MOUTH ONCE DAILY 04/16/18   Biagio Borg, MD  furosemide (LASIX) 40 MG tablet Take 1 tablet (40 mg total) by mouth daily. 09/18/17   Hosie Poisson, MD  hydrALAZINE (APRESOLINE) 100 MG tablet TAKE 1 TABLET BY MOUTH THREE TIMES DAILY 04/09/18   Biagio Borg, MD  isosorbide dinitrate (ISORDIL) 30 MG tablet Take 1 tablet (30 mg total) by mouth 3 (three) times daily. 09/17/17   Hosie Poisson, MD  lactulose (CHRONULAC) 10 GM/15ML solution Take 30 mLs (20 g total) by mouth daily. 09/17/17   Biagio Borg, MD  propranolol (INDERAL) 40 MG tablet Take 1  tablet (40 mg total) by mouth 2 (two) times daily. 09/04/17   Biagio Borg, MD    Physical Exam: Vitals:   05/06/18 2145 05/06/18 2215 05/06/18 2245 05/06/18 2250  BP: (!) 197/51 (!) 199/58 (!) 218/58 (!) 218/58  Pulse: (!) 34 (!) 34 (!) 47 (!) 45  Resp:  18 16 15   Temp:      TempSrc:      SpO2: 100% 100% 100% 100%      Constitutional: NAD, calm, comfortable Vitals:   05/06/18 2145 05/06/18 2215 05/06/18 2245 05/06/18 2250  BP: (!) 197/51 (!) 199/58 (!) 218/58 (!) 218/58  Pulse: (!) 34 (!) 34 (!) 47 (!) 45  Resp:  18 16 15   Temp:      TempSrc:      SpO2: 100% 100% 100% 100%   Eyes: PERRL, lids and conjunctivae normal ENMT: Mucous membranes are moist. Posterior pharynx clear of any exudate or lesions.Normal dentition.  Neck: normal, supple, no masses, no thyromegaly Respiratory: clear to auscultation bilaterally, no wheezing, no crackles. Normal respiratory effort. No accessory muscle use.  Cardiovascular: Regular rate and rhythm, no murmurs / rubs / gallops. No extremity edema. 2+ pedal pulses. No carotid bruits.  Abdomen: no tenderness, no masses palpated. No hepatosplenomegaly. Bowel sounds positive.  Musculoskeletal: no clubbing / cyanosis. No joint deformity upper and lower extremities. Good ROM, no contractures. Normal muscle tone.  Skin: no rashes, lesions, ulcers. No induration Neurologic: CN 2-12 grossly intact. Sensation intact, DTR normal. Strength 5/5 in all 4.  Psychiatric: Normal judgment and insight. Alert and oriented x 3. Normal mood.   Labs on Admission: I have personally reviewed following labs and imaging studies  CBC: Recent Labs  Lab 05/06/18 1923  WBC 3.6*  HGB 9.3*  HCT 28.3*  MCV 87.6  PLT 696*   Basic Metabolic Panel: Recent Labs  Lab 05/06/18 1923  NA 123*  K 6.7*  CL 93*  CO2 21*  GLUCOSE 118*  BUN 42*  CREATININE 3.67*  CALCIUM 9.5   GFR: CrCl cannot be calculated (Unknown ideal weight.). Liver Function Tests: No results for  input(s): AST, ALT, ALKPHOS, BILITOT, PROT, ALBUMIN in the last 168 hours. No results for input(s): LIPASE, AMYLASE in the last 168 hours. No results for input(s): AMMONIA in the last 168 hours. Coagulation Profile: No results for input(s): INR, PROTIME in the last 168 hours. Cardiac Enzymes: No results for input(s): CKTOTAL, CKMB, CKMBINDEX, TROPONINI in the last 168 hours. BNP (last 3 results) No results for input(s): PROBNP in the last 8760 hours. HbA1C: No results for input(s): HGBA1C in the last 72 hours. CBG: No results for input(s): GLUCAP in the last 168 hours. Lipid Profile: No results for input(s): CHOL, HDL, LDLCALC, TRIG, CHOLHDL, LDLDIRECT in  the last 72 hours. Thyroid Function Tests: No results for input(s): TSH, T4TOTAL, FREET4, T3FREE, THYROIDAB in the last 72 hours. Anemia Panel: No results for input(s): VITAMINB12, FOLATE, FERRITIN, TIBC, IRON, RETICCTPCT in the last 72 hours. Urine analysis:    Component Value Date/Time   COLORURINE YELLOW 09/10/2017 0605   APPEARANCEUR HAZY (A) 09/10/2017 0605   LABSPEC 1.014 09/10/2017 0605   PHURINE 6.0 09/10/2017 0605   GLUCOSEU NEGATIVE 09/10/2017 0605   GLUCOSEU NEGATIVE 09/04/2017 0943   HGBUR NEGATIVE 09/10/2017 0605   BILIRUBINUR NEGATIVE 09/10/2017 0605   KETONESUR NEGATIVE 09/10/2017 0605   PROTEINUR >=300 (A) 09/10/2017 0605   UROBILINOGEN 0.2 09/04/2017 0943   NITRITE NEGATIVE 09/10/2017 0605   LEUKOCYTESUR NEGATIVE 09/10/2017 0605   Sepsis Labs: @LABRCNTIP (procalcitonin:4,lacticidven:4) )No results found for this or any previous visit (from the past 240 hour(s)).   Radiological Exams on Admission: No results found.  EKG: Independently reviewed. Shows atrial fibrillation with a rate of 43. No significant ST changes  Assessment/Plan Principal Problem:   Hyperkalemia Active Problems:   Pancytopenia (HCC)   Hyponatremia   Hypertensive urgency   Acute renal failure superimposed on stage 3 chronic kidney  disease (HCC)   #1 hyperkalemia: Most likely due to end-stage renal disease. Patient is going to be admitted to telemetry bed. We will give [place] sodium bicarbonate as well as insulin with glucose. Nephrology consulted. Patient most likely will get catheter and initiate hemodialysis in the hospital.  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 With acidosis as well as hyperkalemia she might require hemodialysis at this point. We'll proceed as such.  #3 hyponatremia: Most likely due to her liver cirrhosis. Monitor patient's sodium.  #4 hypertensive urgency: patient has significant hypertensive urgency. We will treat with IV hydralazine for now. Nephrology will make recommendations regarding hemodialysis and blood pressure control.  #5 history of hepatocellular carcinoma: Secondary to alcohol abuse. Patient has had ablation. Outpatient follow-up with oncology  #6 pancytopenia: Most likely secondarily to liver cirrhosis. Continue to monitor   DVT prophylaxis: heparin  Code Status: Full  Family Communication: daughter at bedside  Disposition Plan: to be determined Consults called: nephrology Dr. Joelyn Oms Admission status: inpatient  Severity of Illness: The appropriate patient status for this patient is INPATIENT. Inpatient status is judged to be reasonable and necessary in order to provide the required intensity of service to ensure the patient's safety. The patient's presenting symptoms, physical exam findings, and initial radiographic and laboratory data in the context of their chronic comorbidities is felt to place them at high risk for further clinical deterioration. Furthermore, it is not anticipated that the patient will be medically stable for discharge from the hospital within 2 midnights of admission. The following factors support the patient status of inpatient.   " The patient's presenting symptoms include weakness. " The worrisome physical exam findings  include potassium 6.7. " The initial radiographic and laboratory data are worrisome because of potassium 6.7. " The chronic co-morbidities include chronic kidney disease stage IV.   * I certify that at the point of admission it is my clinical judgment that the patient will require inpatient hospital care spanning beyond 2 midnights from the point of admission due to high intensity of service, high risk for further deterioration and high frequency of surveillance required.Barbette Merino MD [facility] Pager [phone]  If 7PM-7AM, please contact night-coverage [email or web address] Password [email or web address]  05/06/2018, 11:20 PM

## 2018-05-06 NOTE — ED Triage Notes (Signed)
Patient presents from nephrologist after having blood drawn today.  Patient was told her potassium was 7.1 and sent here for treatment.  Denies CP, denies palpitations, c/o SOB x 2 weeks.

## 2018-05-06 NOTE — ED Notes (Signed)
Nephrology at the bedside.

## 2018-05-06 NOTE — ED Provider Notes (Signed)
[facility] EMERGENCY DEPARTMENT Provider Note   CSN: [id number] Arrival date & time: 05/06/18  1805     History   Chief Complaint Chief Complaint  Patient presents with  . Hyperkalemia    HPI Sarah Harmon is a 72 y.o. female.  Level 5 caveat for urgent need for intervention.  Patient has end-stage renal disease and is slated to start dialysis next month.  She had a nephrology office visit today and her potassium was 7.1.  Sent to the emergency department for further evaluation.  No chest pain, fever, sweats, chills.  She has been dyspneic for 2 weeks.     Past Medical History:  Diagnosis Date  . ALCOHOL ABUSE 07/13/2010  . ANXIETY 12/22/2009  . DEPRESSION 12/22/2009  . 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 DJD (degenerative joint disease), cervical    patient denies on preop of 05/10/15   . FATIGUE 12/23/2010  . GALLSTONES 12/22/2009  . Gallstones   . Headache    occasional   . HYPERLIPIDEMIA 12/22/2009  . HYPERTENSION 12/22/2009  . HYPONATREMIA 12/22/2009  . 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 OSTEOPENIA 12/22/2009  . Pancytopenia 07/13/2010  . RENAL CYST, LEFT 12/22/2009  . VITAMIN D DEFICIENCY 12/23/2010  . WEIGHT LOSS 12/23/2010    Patient Active Problem List   Diagnosis Date Noted  . CKD (chronic kidney disease) stage 3, GFR 30-59 ml/min (HCC) 09/25/2017  . Hyperkalemia 09/10/2017  . Acute metabolic encephalopathy 44/81/8563  . Elevated troponin 09/10/2017  . Slurred speech 09/10/2017  . Cancer, hepatocellular ([place])   . Cirrhosis of liver ([place]) 03/04/2015  . Thrombocytopenia ([place]) 03/04/2015  . Bradycardia 03/04/2015  . Acute renal failure superimposed on stage 3 chronic kidney disease ([place]) 03/04/2015  . Liver mass, right lobe   . Neoplasm /cancer ([place])   . Hepatocellular carcinoma ([place])   . Alcoholic cirrhosis of liver without ascites ([place])   . Liver mass   . Left kidney mass   . 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 SIADH (syndrome of  inappropriate ADH production) ([place])   . Hyponatremia 02/26/2015  . Hypertensive urgency 02/26/2015  . Rash and nonspecific skin eruption 07/29/2013  . Impaired glucose tolerance 12/27/2012  . Fatigue 12/26/2011  . Preventative health care 06/25/2011  . VITAMIN D DEFICIENCY 12/23/2010  . WEIGHT LOSS 12/23/2010  . 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 Pancytopenia ([place]) 07/13/2010  . ALCOHOL ABUSE 07/13/2010  . 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 Hyperlipidemia 12/22/2009  . HYPONATREMIA 12/22/2009  . Anxiety state 12/22/2009  . Depression 12/22/2009  . Essential hypertension 12/22/2009  . GALLSTONES 12/22/2009  . RENAL CYST, LEFT 12/22/2009  . Osteoporosis 12/22/2009  . OTHER NONSPECIFIC FINDING EXAMINATION OF URINE 12/22/2009    Past Surgical History:  Procedure Laterality Date  . ABDOMINAL HYSTERECTOMY    . IR GENERIC HISTORICAL  10/18/2016   IR RADIOLOGIST EVAL & MGMT 10/18/2016 [name], MD [facility] INTERV RAD  . IR RADIOLOGIST EVAL & MGMT  05/30/2017     OB History   None      Home Medications    Prior to Admission medications   Medication Sig Start Date End Date Taking? Authorizing Provider  ALPRAZolam Duanne Moron) 0.5 MG tablet TAKE 1 TABLET BY MOUTH ONCE DAILY AT BEDTIME 03/27/18   Biagio Borg, MD  amLODipine (NORVASC) 10 MG tablet TAKE 1 TABLET BY MOUTH ONCE DAILY 03/11/18   Biagio Borg, MD  Cholecalciferol (VITAMIN D3) 1000 UNITS CAPS Take  200 Units by mouth every morning.     [provider]  citalopram (CELEXA) 20 MG tablet TAKE ONE-HALF TABLET BY MOUTH ONCE DAILY 02/14/18   Biagio Borg, MD  cloNIDine (CATAPRES) 0.1 MG tablet Take 1 tablet (0.1 mg total) by mouth daily. 09/18/17   Hosie Poisson, MD  feeding supplement, ENSURE ENLIVE, (ENSURE ENLIVE) LIQD Take 237 mLs by mouth 3 (three) times daily between meals. 09/17/17   Hosie Poisson, MD  folic acid (FOLVITE) 1 MG tablet TAKE 1 TABLET BY MOUTH ONCE DAILY 04/16/18   Biagio Borg, MD    furosemide (LASIX) 40 MG tablet Take 1 tablet (40 mg total) by mouth daily. 09/18/17   Hosie Poisson, MD  hydrALAZINE (APRESOLINE) 100 MG tablet TAKE 1 TABLET BY MOUTH THREE TIMES DAILY 04/09/18   Biagio Borg, MD  isosorbide dinitrate (ISORDIL) 30 MG tablet Take 1 tablet (30 mg total) by mouth 3 (three) times daily. 09/17/17   Hosie Poisson, MD  lactulose (CHRONULAC) 10 GM/15ML solution Take 30 mLs (20 g total) by mouth daily. 09/17/17   Biagio Borg, MD  propranolol (INDERAL) 40 MG tablet Take 1 tablet (40 mg total) by mouth 2 (two) times daily. 09/04/17   Biagio Borg, MD    Family History Family History  Problem Relation Age of Onset  . Lung cancer Brother   . Breast cancer Other   . Breast cancer Sister   . Gastric cancer Sister   . Lung cancer Sister   . Colon cancer Neg Hx   . Colon polyps Neg Hx   . Rectal cancer Neg Hx   . Stomach cancer Neg Hx   . Esophageal cancer Neg Hx     Social History Social History   Tobacco Use  . Smoking status: Never Smoker  . Smokeless tobacco: Never Used  Substance Use Topics  . Alcohol use: Yes    Alcohol/week: 1.8 - 2.4 oz    Types: 3 - 4 Cans of beer per week    Comment: she quit 02/2015   . Drug use: No     Allergies   Patient has no known allergies.   Review of Systems Review of Systems  Unable to perform ROS: Acuity of condition     Physical Exam Updated Vital Signs BP (!) 197/51   Pulse (!) 34   Temp 97.9 F (36.6 C) (Oral)   Resp 18   SpO2 100%   Physical Exam  Constitutional: She is oriented to person, place, and time.  No acute distress  HENT:  Head: Normocephalic and atraumatic.  Eyes: Conjunctivae are normal.  Neck: Neck supple.  Cardiovascular:  Rhythm is bradycardia with irregularity.  Pulmonary/Chest: Effort normal and breath sounds normal.  Abdominal: Soft. Bowel sounds are normal.  Musculoskeletal: Normal range of motion.  Neurological: She is alert and oriented to person, place, and time.   Skin: Skin is warm and dry.  Psychiatric: She has a normal mood and affect. Her behavior is normal.  Nursing note and vitals reviewed.    ED Treatments / Results  Labs (all labs ordered are listed, but only abnormal results are displayed) Labs Reviewed  CBC - Abnormal; Notable for the following components:      Result Value   WBC 3.6 (*)    RBC 3.23 (*)    Hemoglobin 9.3 (*)    HCT 28.3 (*)    Platelets 145 (*)    All other components within normal limits  BASIC  METABOLIC PANEL - Abnormal; Notable for the following components:   Sodium 123 (*)    Potassium 6.7 (*)    Chloride 93 (*)    CO2 21 (*)    Glucose, Bld 118 (*)    BUN 42 (*)    Creatinine, Ser 3.67 (*)    GFR calc non Af Amer 11 (*)    GFR calc Af Amer 13 (*)    All other components within normal limits    EKG EKG Interpretation  Date/Time:  Monday May 06 2018 19:06:32 EDT Ventricular Rate:  43 PR Interval:    QRS Duration: 90 QT Interval:  502 QTC Calculation: 424 R Axis:   83 Text Interpretation:  Atrial fibrillation with slow ventricular response Cannot rule out Inferior infarct , age undetermined Abnormal ECG Confirmed by Nat Christen [phone]) on 05/06/2018 8:32:05 PM   Radiology No results found.  Procedures Procedures (including critical care time)  Medications Ordered in ED Medications  calcium gluconate 1 g in sodium chloride 0.9 % 100 mL IVPB (has no administration in time range)  sodium bicarbonate injection 50 mEq (has no administration in time range)  furosemide (LASIX) injection 40 mg (has no administration in time range)  dextrose 50 % solution 50 mL (has no administration in time range)  insulin aspart (novoLOG) injection 5 Units (has no administration in time range)  cloNIDine (CATAPRES) tablet 0.1 mg (has no administration in time range)  sodium polystyrene (KAYEXALATE) 15 GM/60ML suspension 30 g (30 g Oral Given 05/06/18 2120)     Initial Impression / Assessment and Plan / ED  Course  I have reviewed the triage vital signs and the nursing notes.  Pertinent labs & imaging results that were available during my care of the patient were reviewed by me and considered in my medical decision making (see chart for details).    Patient with known end-stage renal disease presents with hyperkalemia and hyponatremia.  She is bradycardic with an irregular pulse.  Will initiate IV Lasix, IV calcium, IV bicarb, IV dextrose and insulin, Kayexalate.  Discussed with nephrologist.  Admit to general medicine.  CRITICAL CARE Performed by: Nat Christen Total critical care time: 35 minutes Critical care time was exclusive of separately billable procedures and treating other patients. Critical care was necessary to treat or prevent imminent or life-threatening deterioration. Critical care was time spent personally by me on the following activities: development of treatment plan with patient and/or surrogate as well as nursing, discussions with consultants, evaluation of patient's response to treatment, examination of patient, obtaining history from patient or surrogate, ordering and performing treatments and interventions, ordering and review of laboratory studies, ordering and review of radiographic studies, pulse oximetry and re-evaluation of patient's condition.  Final Clinical Impressions(s) / ED Diagnoses   Final diagnoses:  ESRD (end stage renal disease) ([place])  Hyperkalemia  Bradycardia    ED Discharge Orders    None       Nat Christen, MD 05/06/18 2215

## 2018-05-06 NOTE — Consult Note (Signed)
Sarah Harmon Admit Date: 05/06/2018 05/06/2018 Rexene Agent Requesting Physician:  Lindajo Royal MD  Reason for Consult:  Hyperkalemia, Hyponatremia, Advanced CKD HPI:  72 year old female who presented to the emergency room today after having abnormal labs at outpatient nephrology visit including hyperkalemia with potassium of 7.1 and hyponatremia.  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 Moshe Cipro; nearing dialysis.  Presumed etiology hypertension with recurrent acute on chronic renal failure.  Cirrhosis, combination alcoholic and autoimmune etiology; no clear history of portal hypertension, ascites  Hx/o HCC, s/p radioablation 04/2015; follows with Ky Barban; MRI 10/2017 w/o recurrence  Chronic Hyponatremia Na hig h120s low 130s usually  MDD/Anxiety  Patient lives alone.  She has assistance from family who does some shopping for her.  It sounds like the patient is responsible for most of her meal prep and eats alone.  She frequently eats baked sweet potatoes with butter.  Occ ensure.  When I probe a little bit the family acknowledges she likely has very poor nutrition and they cannot confirm that she eats many meals at all.  At today's outpatient visit her weight was noted to have fallen 21 pounds.  Her labs, as mentioned above, resulted with a potassium of 7.1 and a sodium of 122 with a BUN of 44 and a creatinine of 3.78.  Per the family the topic of today's outpatient visit was her impending need to start dialysis given likely uremia contributing to her weight loss.  Outpatient medications include spironolactone 12.5 mg twice daily, propranolol 40 mg twice daily, it appears that she has not been on furosemide since at least April 2019.  She was seen in the emergency room where labs largely reconfirmed outpatient numbers, her potassium was 6.7.  EKG without clear peaked T waves.  She has received Kayexalate 30 g, calcium  gluconate, furosemide, insulin/dextrose, sodium bicarbonate.  She has been found to have a pulse in the high 30s and low 40s with irregularity consistent with A. fib with slow ventricular response; this appears to be a new finding.  It appears she usually does have a mild bradycardia in the 50s.  No N/V, HAs, vision chagnes     Creatinine (mg/dL)  Date Value  10/31/2017 3.2 (HH)  04/30/2017 1.9 (H)  05/16/2016 1.3 (H)  09/16/2015 1.3 (H)   Creat (mg/dL)  Date Value  09/02/2016 1.64 (H)  01/15/2016 1.24 (H)  07/05/2015 1.43 (H)   Creatinine, Ser (mg/dL)  Date Value  05/06/2018 3.67 (H)  02/07/2018 2.94 (H)  10/31/2017 3.20 (H)  09/17/2017 3.28 (H)  09/16/2017 3.44 (H)  09/16/2017 3.34 (H)  09/15/2017 3.33 (H)  09/14/2017 3.47 (H)  09/14/2017 3.43 (H)  09/12/2017 2.85 (H)  ] ROS NSAIDS: denies IV Contrast no exposure TMP/SMX no exposure Hypotension no exposure Balance of 12 systems is negative w/ exceptions as above  PMH  Past Medical History:  Diagnosis Date  . ALCOHOL ABUSE 07/13/2010  . ANXIETY 12/22/2009  . DEPRESSION 12/22/2009  . 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 DJD (degenerative joint disease), cervical    patient denies on preop of 05/10/15   . FATIGUE 12/23/2010  . GALLSTONES 12/22/2009  . Gallstones   . Headache    occasional   . HYPERLIPIDEMIA 12/22/2009  . HYPERTENSION 12/22/2009  . HYPONATREMIA 12/22/2009  . 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 OSTEOPENIA 12/22/2009  . Pancytopenia 07/13/2010  . RENAL CYST, LEFT 12/22/2009  . VITAMIN  D DEFICIENCY 12/23/2010  . WEIGHT LOSS 12/23/2010   PSH  Past Surgical History:  Procedure Laterality Date  . ABDOMINAL HYSTERECTOMY    . IR GENERIC HISTORICAL  10/18/2016   IR RADIOLOGIST EVAL & MGMT 10/18/2016 [name], MD [facility] INTERV RAD  . IR RADIOLOGIST EVAL & MGMT  05/30/2017   FH  Family History  Problem Relation Age of Onset  . Lung cancer Brother   . Breast cancer Other   . Breast cancer Sister    . Gastric cancer Sister   . Lung cancer Sister   . Colon cancer Neg Hx   . Colon polyps Neg Hx   . Rectal cancer Neg Hx   . Stomach cancer Neg Hx   . Esophageal cancer Neg Hx    SH  reports that she has never smoked. She has never used smokeless tobacco. She reports that she drinks about 1.8 - 2.4 oz of alcohol per week. She reports that she does not use drugs. Allergies No Known Allergies Home medications Prior to Admission medications   Medication Sig Start Date End Date Taking? Authorizing Provider  ALPRAZolam Duanne Moron) 0.5 MG tablet TAKE 1 TABLET BY MOUTH ONCE DAILY AT BEDTIME 03/27/18   Biagio Borg, MD  amLODipine (NORVASC) 10 MG tablet TAKE 1 TABLET BY MOUTH ONCE DAILY 03/11/18   Biagio Borg, MD  Cholecalciferol (VITAMIN D3) 1000 UNITS CAPS Take 200 Units by mouth every morning.     [provider]  citalopram (CELEXA) 20 MG tablet TAKE ONE-HALF TABLET BY MOUTH ONCE DAILY 02/14/18   Biagio Borg, MD  cloNIDine (CATAPRES) 0.1 MG tablet Take 1 tablet (0.1 mg total) by mouth daily. 09/18/17   Hosie Poisson, MD  feeding supplement, ENSURE ENLIVE, (ENSURE ENLIVE) LIQD Take 237 mLs by mouth 3 (three) times daily between meals. 09/17/17   Hosie Poisson, MD  folic acid (FOLVITE) 1 MG tablet TAKE 1 TABLET BY MOUTH ONCE DAILY 04/16/18   Biagio Borg, MD  furosemide (LASIX) 40 MG tablet Take 1 tablet (40 mg total) by mouth daily. 09/18/17   Hosie Poisson, MD  hydrALAZINE (APRESOLINE) 100 MG tablet TAKE 1 TABLET BY MOUTH THREE TIMES DAILY 04/09/18   Biagio Borg, MD  isosorbide dinitrate (ISORDIL) 30 MG tablet Take 1 tablet (30 mg total) by mouth 3 (three) times daily. 09/17/17   Hosie Poisson, MD  lactulose (CHRONULAC) 10 GM/15ML solution Take 30 mLs (20 g total) by mouth daily. 09/17/17   Biagio Borg, MD  propranolol (INDERAL) 40 MG tablet Take 1 tablet (40 mg total) by mouth 2 (two) times daily. 09/04/17   Biagio Borg, MD    Current Medications Scheduled Meds: . sodium polystyrene   30 g Oral Once   Continuous Infusions: PRN Meds:.  CBC Recent Labs  Lab 05/06/18 1923  WBC 3.6*  HGB 9.3*  HCT 28.3*  MCV 87.6  PLT 433*   Basic Metabolic Panel Recent Labs  Lab 05/06/18 1923  NA 123*  K 6.7*  CL 93*  CO2 21*  GLUCOSE 118*  BUN 42*  CREATININE 3.67*  CALCIUM 9.5    Physical Exam  Blood pressure (!) 201/59, pulse (!) 33, temperature 97.9 F (36.6 C), temperature source Oral, resp. rate 19, SpO2 100 %. GEN: Elderly female, no acute distress, frail appearing ENT: NCAT EYES: EOMI CV: brady, irregular, no rub, no murmur PULM: CTAB, nl wob ABD: s/nt/nd, no ascites SKIN: No rashes/lesions EXT:No LEE NEURO: nonfocal, particpiates in conversation  Assessment 59F with late stage CKD5, Cirrhosis, hx/o HCC, with hyperkalemia, hyponatremia, sig unintentiona weight loss / poor nutrition.   1. Hyperkalemia, Has AFib with SVR not sure clearly from this problem (On BB) ,no peaked Ts); K 6.7 lower than 7.1 earlier today; driven by low GFR + MRB + eating baked sweet potatoes (albeit better than baked white potato); 2. Hyponatremia, Acute on Chronic; even though cirrhotic not hpyervolemic on exam; ? If related to low solute intake 3. Progressive CKD5; probably with some uremia but is marginal HD candidate  4. Cirrhosis, Autoimmune +/- Alcohol; hx/o [place] s/p ablation follows with Ky Barban; no recurrence 10/2017 MRI 5. FTT, 20lb weight loss 46mo; poor nutrition, likely related #3  6. Bradycardia, on BB, AFib with SVR? 7. HTN, elevated BP now   Plan 1. Agree with intial attempt at medical mgmt of hyperkalemia: f/u labs after kayexalate, lasix, insulin 2. I think likely is hypovolemic hypotonic (not proven) hyponatremia; give 0.5L NS bolus and follow labs closely; SOsm, UOsm, UNa,  3. Hold BB 4. Will need to discuss timing and if/when of HD after stable; if K worsens will need HD more urgently 5. EKG in AM; telemetry 6. Nutrition consult 7. Monitor BP, would add  amlodipine or hydralazine as first agents as needed  Pearson Grippe MD [phone] pgr 05/06/2018, 9:20 PM

## 2018-05-07 ENCOUNTER — Encounter (HOSPITAL_COMMUNITY): Payer: Self-pay | Admitting: General Practice

## 2018-05-07 ENCOUNTER — Other Ambulatory Visit: Payer: Self-pay

## 2018-05-07 DIAGNOSIS — D61818 Other pancytopenia: Secondary | ICD-10-CM

## 2018-05-07 DIAGNOSIS — E871 Hypo-osmolality and hyponatremia: Secondary | ICD-10-CM

## 2018-05-07 DIAGNOSIS — I10 Essential (primary) hypertension: Secondary | ICD-10-CM

## 2018-05-07 LAB — COMPREHENSIVE METABOLIC PANEL
ALK PHOS: 38 U/L (ref 38–126)
ALT: 13 U/L — ABNORMAL LOW (ref 14–54)
AST: 24 U/L (ref 15–41)
Albumin: 3.5 g/dL (ref 3.5–5.0)
Anion gap: 13 (ref 5–15)
BILIRUBIN TOTAL: 0.8 mg/dL (ref 0.3–1.2)
BUN: 39 mg/dL — ABNORMAL HIGH (ref 6–20)
CO2: 19 mmol/L — ABNORMAL LOW (ref 22–32)
Calcium: 9.5 mg/dL (ref 8.9–10.3)
Chloride: 95 mmol/L — ABNORMAL LOW (ref 101–111)
Creatinine, Ser: 3.61 mg/dL — ABNORMAL HIGH (ref 0.44–1.00)
GFR, EST AFRICAN AMERICAN: 13 mL/min — AB (ref >60)
GFR, EST NON AFRICAN AMERICAN: 12 mL/min — AB (ref >60)
Glucose, Bld: 98 mg/dL (ref 65–99)
Potassium: 4.8 mmol/L (ref 3.5–5.1)
Sodium: 127 mmol/L — ABNORMAL LOW (ref 135–145)
TOTAL PROTEIN: 7.4 g/dL (ref 6.5–8.1)

## 2018-05-07 LAB — BASIC METABOLIC PANEL
Anion gap: 9 (ref 5–15)
BUN: 39 mg/dL — AB (ref 6–20)
CHLORIDE: 93 mmol/L — AB (ref 101–111)
CO2: 23 mmol/L (ref 22–32)
CREATININE: 3.56 mg/dL — AB (ref 0.44–1.00)
Calcium: 9.1 mg/dL (ref 8.9–10.3)
GFR calc Af Amer: 14 mL/min — ABNORMAL LOW (ref >60)
GFR calc non Af Amer: 12 mL/min — ABNORMAL LOW (ref >60)
Glucose, Bld: 128 mg/dL — ABNORMAL HIGH (ref 65–99)
Potassium: 4.1 mmol/L (ref 3.5–5.1)
Sodium: 125 mmol/L — ABNORMAL LOW (ref 135–145)

## 2018-05-07 LAB — SODIUM, URINE, RANDOM: Sodium, Ur: 81 mmol/L

## 2018-05-07 LAB — IRON AND TIBC
IRON: 33 ug/dL (ref 28–170)
Saturation Ratios: 11 % (ref 10.4–31.8)
TIBC: 295 ug/dL (ref 250–450)
UIBC: 262 ug/dL

## 2018-05-07 LAB — CBC
HCT: 27.1 % — ABNORMAL LOW (ref 36.0–46.0)
HEMOGLOBIN: 9.1 g/dL — AB (ref 12.0–15.0)
MCH: 28.9 pg (ref 26.0–34.0)
MCHC: 33.6 g/dL (ref 30.0–36.0)
MCV: 86 fL (ref 78.0–100.0)
Platelets: 134 10*3/uL — ABNORMAL LOW (ref 150–400)
RBC: 3.15 MIL/uL — AB (ref 3.87–5.11)
RDW: 12.8 % (ref 11.5–15.5)
WBC: 4.8 10*3/uL (ref 4.0–10.5)

## 2018-05-07 LAB — CBG MONITORING, ED: Glucose-Capillary: 128 mg/dL — ABNORMAL HIGH (ref 65–99)

## 2018-05-07 LAB — OSMOLALITY, URINE: Osmolality, Ur: 234 mOsm/kg — ABNORMAL LOW (ref 300–900)

## 2018-05-07 MED ORDER — DARBEPOETIN ALFA 60 MCG/0.3ML IJ SOSY
60.0000 ug | PREFILLED_SYRINGE | INTRAMUSCULAR | Status: DC
  Administered 2018-05-07: 60 ug via SUBCUTANEOUS
  Filled 2018-05-07 (×2): qty 0.3

## 2018-05-07 MED ORDER — ACETAMINOPHEN 325 MG PO TABS
650.0000 mg | ORAL_TABLET | Freq: Four times a day (QID) | ORAL | Status: DC | PRN
  Administered 2018-05-07: 650 mg via ORAL
  Filled 2018-05-07: qty 2

## 2018-05-07 MED ORDER — VITAMIN D 1000 UNITS PO TABS
1000.0000 [IU] | ORAL_TABLET | Freq: Every day | ORAL | Status: DC
  Administered 2018-05-07 – 2018-05-09 (×3): 1000 [IU] via ORAL
  Filled 2018-05-07 (×3): qty 1

## 2018-05-07 MED ORDER — NEPRO/CARBSTEADY PO LIQD
237.0000 mL | Freq: Two times a day (BID) | ORAL | Status: DC
  Administered 2018-05-07 – 2018-05-09 (×5): 237 mL via ORAL
  Filled 2018-05-07: qty 237

## 2018-05-07 NOTE — ED Notes (Signed)
Requested hospital bed from [facility] 

## 2018-05-07 NOTE — ED Notes (Signed)
Pt assisted to bedside commode; pt began vomiting (emesis smelled of kayexalate); also c/o headache

## 2018-05-07 NOTE — Progress Notes (Signed)
PROGRESS NOTE        PATIENT DETAILS Name: FATINA SPRANKLE Age: 72 y.o. Sex: female Date of Birth: 1946/02/26 Admit Date: 05/06/2018 Admitting Physician No admitting provider for patient encounter. AJO:INOM, Hunt Oris, MD  Brief Narrative: Patient is a 72 y.o. female with prior history of chronic kidney disease stage IV-V, cirrhosis, history of hepatocellular cancer status post radioablation in 2016, chronic hyponatremia-sent to the ED for evaluation of hyperkalemia and hyponatremia.  See below for further details  Subjective: Feels better-no nausea vomiting.  Assessment/Plan: Hyperkalemia: Secondary to possible Aldactone use (per nephrology), ensure in the background of chronic kidney disease stage IV-V.  Resolved with Kayexalate/IV sodium bicarbonate.  Avoid Aldactone and ensure in the future.  Hyponatremia: Appears to have chronic hyponatremia-improved with IV fluids-suspected to have hypovolemic hypotonic hyponatremia.  Does have cirrhosis at baseline as well.  No longer on IV fluids, remains on Lasix.  Urology consulting-await formal evaluation.  Chronic kidney disease stage IV-V: Per family-outpatient nephrology has had discussions about dialysis in the near future.  Currently do not see any acute indications.  Hypertension: Controlled-continue with hydralazine, indoor, clonidine and amlodipine.  Hepatocellular carcinoma/alcoholic liver cirrhosis: Continue outpatient follow-up with Dr. Richardo Priest CT-guided microwave ablation in 2016.  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  MEDICATIONS: Scheduled Meds: . ALPRAZolam  0.5 mg Oral QHS  . amLODipine  10 mg Oral Daily  . cholecalciferol  1,000 Units Oral Daily  . citalopram  10 mg Oral Daily  . cloNIDine  0.1 mg Oral Daily  . darbepoetin (ARANESP) injection - NON-DIALYSIS  60 mcg Subcutaneous Q Tue-1800  . feeding supplement (NEPRO CARB STEADY)  237 mL Oral BID BM  . folic acid  1 mg Oral Daily  . furosemide  40 mg Oral Daily  . heparin  5,000 Units Subcutaneous Q8H  . hydrALAZINE  100 mg Oral TID  . isosorbide dinitrate  30 mg Oral TID  . lactulose  20 g Oral Daily  . propranolol  40 mg Oral BID   Continuous Infusions: PRN Meds:.acetaminophen, ondansetron **OR** ondansetron (ZOFRAN) IV   PHYSICAL EXAM: Vital signs: Vitals:   05/07/18 0830 05/07/18 0930 05/07/18 1000 05/07/18 1200  BP:   (!) 150/71 133/65  Pulse:      Resp: 13 20 18 16   Temp:      TempSrc:      SpO2:       There were no vitals filed for this visit. There is no height or weight on file to calculate BMI.   General appearance :Awake, alert, not in any distress. HEENT: Atraumatic and Normocephalic Neck: supple, no JVD. No cervical lymphadenopathy. No thyromegaly Resp:Good air entry bilaterally, no added sounds  CVS: S1 S2 regular, no murmurs.  GI: Bowel sounds present, Non tender and not  distended with no gaurding, rigidity or rebound.No organomegaly Extremities: B/L Lower Ext shows no edema, both legs are warm to touch Neurology:  speech clear,Non focal, sensation is grossly intact. Psychiatric: Normal judgment and insight. Alert and oriented x 3. Normal mood. Musculoskeletal:No digital cyanosis Skin:No Rash, warm and dry Wounds:N/A  I have personally  reviewed following labs and imaging studies  LABORATORY DATA: CBC: Recent Labs  Lab 05/06/18 1923 05/07/18 0309  WBC 3.6* 4.8  HGB 9.3* 9.1*  HCT 28.3* 27.1*  MCV 87.6 86.0  PLT 145* 134*    Basic Metabolic Panel: Recent Labs  Lab 05/06/18 1923 05/07/18 0309 05/07/18 0852  NA 123* 127* 125*  K 6.7* 4.8 4.1  CL 93* 95* 93*  CO2 21* 19* 23  GLUCOSE 118* 98 128*  BUN 42* 39* 39*  CREATININE 3.67* 3.61* 3.56*  CALCIUM 9.5 9.5 9.1    GFR: CrCl cannot be calculated (Unknown ideal weight.).  Liver Function Tests: Recent Labs  Lab 05/07/18 0309  AST 24  ALT 13*  ALKPHOS 38  BILITOT 0.8  PROT 7.4  ALBUMIN 3.5   No results for input(s): LIPASE, AMYLASE in the last 168 hours. No results for input(s): AMMONIA in the last 168 hours.  Coagulation Profile: No results for input(s): INR, PROTIME in the last 168 hours.  Cardiac Enzymes: No results for input(s): CKTOTAL, CKMB, CKMBINDEX, TROPONINI in the last 168 hours.  BNP (last 3 results) No results for input(s): PROBNP in the last 8760 hours.  HbA1C: No results for input(s): HGBA1C in the last 72 hours.  CBG: Recent Labs  Lab 05/07/18 1106  GLUCAP 128*    Lipid Profile: No results for input(s): CHOL, HDL, LDLCALC, TRIG, CHOLHDL, LDLDIRECT in the last 72 hours.  Thyroid Function Tests: No results for input(s): TSH, T4TOTAL, FREET4, T3FREE, THYROIDAB in the last 72 hours.  Anemia Panel: Recent Labs    05/07/18 0852  TIBC 295  IRON 33    Urine analysis:    Component Value Date/Time   COLORURINE YELLOW 09/10/2017 0605   APPEARANCEUR HAZY (A) 09/10/2017 0605   LABSPEC 1.014 09/10/2017 0605   PHURINE 6.0 09/10/2017 0605   GLUCOSEU NEGATIVE 09/10/2017 0605   GLUCOSEU NEGATIVE 09/04/2017 0943   HGBUR NEGATIVE 09/10/2017 0605   BILIRUBINUR NEGATIVE 09/10/2017 0605   KETONESUR NEGATIVE 09/10/2017 0605   PROTEINUR >=300 (A) 09/10/2017 0605   UROBILINOGEN 0.2 09/04/2017 0943   NITRITE NEGATIVE  09/10/2017 0605   LEUKOCYTESUR NEGATIVE 09/10/2017 0605    Sepsis Labs: Lactic Acid, Venous No results found for: LATICACIDVEN  MICROBIOLOGY: No results found for this or any previous visit (from the past 240 hour(s)).  RADIOLOGY STUDIES/RESULTS: No results found.   LOS: 1 day   Oren Binet, MD  [facility]  If 7PM-7AM, please contact night-coverage  Please page via [email or web address]-Password [email or web address]-click on MD name and type text message  05/07/2018, 12:42 PM

## 2018-05-07 NOTE — ED Notes (Signed)
ED TO INPATIENT HANDOFF REPORT  Name/Age/Gender Sarah Harmon 72 y.o. female  Code Status    Code Status Orders  (From admission, onward)        Start     Ordered   05/06/18 2318  Full code  Continuous     05/06/18 2317    Code Status History    Date Active Date Inactive Code Status Order ID Comments User Context   09/10/2017 0831 09/17/2017 2207 Full Code [id number]  Charlesetta Garibaldi, MD Inpatient   05/14/2015 1036 05/15/2015 1605 Full Code [id number]  [name], MD Inpatient   03/05/2015 0555 03/07/2015 1429 Full Code [id number]  Rise Patience, MD ED   02/26/2015 2342 03/02/2015 1943 Full Code [id number]  Rise Patience, MD Inpatient      Home/SNF/Other Home  Chief Complaint low potasium   Level of Care/Admitting Diagnosis ED Disposition    ED Disposition Condition [facility] Area: [place] [100100]  Level of Care: Telemetry [5]  Diagnosis: Hyperkalemia [017494]  Admitting Physician: Elwyn Reach [[id number]]  Attending Physician: Elwyn Reach [[id number]]  Estimated length of stay: past midnight tomorrow  Certification:: I certify this patient will need inpatient services for at least 2 midnights  PT Class (Do Not Modify): Inpatient [101]  PT Acc Code (Do Not Modify): Private [1]       Medical History Past Medical History:  Diagnosis Date  . ALCOHOL ABUSE 07/13/2010  . ANXIETY 12/22/2009  . DEPRESSION 12/22/2009  . 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 DJD (degenerative joint disease), cervical    patient denies on preop of 05/10/15   . FATIGUE 12/23/2010  . GALLSTONES 12/22/2009  . Gallstones   . Headache    occasional   . HYPERLIPIDEMIA 12/22/2009  . HYPERTENSION 12/22/2009  . HYPONATREMIA 12/22/2009  . 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 OSTEOPENIA 12/22/2009  . Pancytopenia 07/13/2010  . RENAL CYST, LEFT 12/22/2009  . VITAMIN D DEFICIENCY 12/23/2010  . WEIGHT LOSS 12/23/2010    Allergies No Known  Allergies  IV Location/Drains/Wounds Patient Lines/Drains/Airways Status   Active Line/Drains/Airways    Name:   Placement date:   Placement time:   Site:   Days:   Peripheral IV 05/07/18 Right Wrist   05/07/18    0340    Wrist   less than 1          Labs/Imaging Results for orders placed or performed during the hospital encounter of 05/06/18 (from the past 48 hour(s))  CBC     Status: Abnormal   Collection Time: 05/06/18  7:23 PM  Result Value Ref Range   WBC 3.6 (L) 4.0 - 10.5 K/uL   RBC 3.23 (L) 3.87 - 5.11 MIL/uL   Hemoglobin 9.3 (L) 12.0 - 15.0 g/dL   HCT 28.3 (L) 36.0 - 46.0 %   MCV 87.6 78.0 - 100.0 fL   MCH 28.8 26.0 - 34.0 pg   MCHC 32.9 30.0 - 36.0 g/dL   RDW 12.9 11.5 - 15.5 %   Platelets 145 (L) 150 - 400 K/uL    Comment: Performed at [facility], [place] [address] [place], [place] [address]  Basic metabolic panel     Status: Abnormal   Collection Time: 05/06/18  7:23 PM  Result Value Ref Range   Sodium 123 (L) 135 - 145 mmol/L   Potassium 6.7 (HH) 3.5 - 5.1 mmol/L    Comment: NO VISIBLE HEMOLYSIS CRITICAL RESULT  CALLED TO, READ BACK BY AND VERIFIED WITH: DR. Wallene Huh 05/06/18 2110 DAVISB    Chloride 93 (L) 101 - 111 mmol/L   CO2 21 (L) 22 - 32 mmol/L   Glucose, Bld 118 (H) 65 - 99 mg/dL   BUN 42 (H) 6 - 20 mg/dL   Creatinine, Ser 3.67 (H) 0.44 - 1.00 mg/dL   Calcium 9.5 8.9 - 10.3 mg/dL   GFR calc non Af Amer 11 (L) >60 mL/min   GFR calc Af Amer 13 (L) >60 mL/min    Comment: (NOTE) The eGFR has been calculated using the CKD EPI equation. This calculation has not been validated in all clinical situations. eGFR's persistently <60 mL/min signify possible Chronic Kidney Disease.    Anion gap 9 5 - 15    Comment: Performed at [facility] [address] [place], [place] [address]  Osmolality     Status: None   Collection Time: 05/06/18 11:08 PM  Result Value Ref Range   Osmolality 281 275 - 295 mOsm/kg    Comment: Performed at [facility], [place] [address] [place], [place] [address]  Comprehensive metabolic panel     Status: Abnormal   Collection Time: 05/07/18  3:09 AM  Result Value Ref Range   Sodium 127 (L) 135 - 145 mmol/L   Potassium 4.8 3.5 - 5.1 mmol/L    Comment: NO VISIBLE HEMOLYSIS   Chloride 95 (L) 101 - 111 mmol/L   CO2 19 (L) 22 - 32 mmol/L   Glucose, Bld 98 65 - 99 mg/dL   BUN 39 (H) 6 - 20 mg/dL   Creatinine, Ser 3.61 (H) 0.44 - 1.00 mg/dL   Calcium 9.5 8.9 - 10.3 mg/dL   Total Protein 7.4 6.5 - 8.1 g/dL   Albumin 3.5 3.5 - 5.0 g/dL   AST 24 15 - 41 U/L   ALT 13 (L) 14 - 54 U/L   Alkaline Phosphatase 38 38 - 126 U/L   Total Bilirubin 0.8 0.3 - 1.2 mg/dL   GFR calc non Af Amer 12 (L) >60 mL/min   GFR calc Af Amer 13 (L) >60 mL/min    Comment: (NOTE) The eGFR has been calculated using the CKD EPI equation. This calculation has not been validated in all clinical situations. eGFR's persistently <60 mL/min signify possible Chronic Kidney Disease.    Anion gap 13 5 - 15    Comment: Performed at [facility] [address] [place], [place] [address]  CBC     Status: Abnormal   Collection Time: 05/07/18  3:09 AM  Result Value Ref Range   WBC 4.8 4.0 - 10.5 K/uL   RBC 3.15 (L) 3.87 - 5.11 MIL/uL   Hemoglobin 9.1 (L) 12.0 - 15.0 g/dL   HCT 27.1 (L) 36.0 - 46.0 %   MCV 86.0 78.0 - 100.0 fL   MCH 28.9 26.0 - 34.0 pg   MCHC 33.6 30.0 - 36.0 g/dL   RDW 12.8 11.5 - 15.5 %   Platelets 134 (L) 150 - 400 K/uL    Comment: REPEATED TO VERIFY SPECIMEN CHECKED FOR CLOTS PLATELET COUNT CONFIRMED BY SMEAR Performed at [facility], [place] [address] [place], [place] [address]    No results found.  Pending Labs Unresulted Labs (From admission, onward)   Start     Ordered   05/13/18 0500  Creatinine, serum  (enoxaparin (LOVENOX)    CrCl >/= 30 ml/min)  Weekly,   R  Comments:  while on enoxaparin therapy    05/06/18 2317   05/07/18 9150  Basic metabolic panel  Now then every 4 hours,    R     05/06/18 2221   05/06/18 2219  Sodium, urine, random  Once,   R     05/06/18 2221   05/06/18 2219  Osmolality, urine  Once,   R     05/06/18 2221      Vitals/Pain Today's Vitals   05/07/18 0500 05/07/18 0530 05/07/18 0600 05/07/18 0630  BP: 132/67 129/68 128/69 132/66  Pulse: 60 61 61 (!) 59  Resp: 20 19 (!) 21 (!) 24  Temp:      TempSrc:      SpO2: 100% 99% 100% 100%  PainSc:        Isolation Precautions No active isolations  Medications Medications  Vitamin D3 CAPS 1,000 Units (has no administration in time range)  propranolol (INDERAL) tablet 40 mg (40 mg Oral Given 05/07/18 0036)  lactulose (CHRONULAC) 10 GM/15ML solution 20 g (has no administration in time range)  cloNIDine (CATAPRES) tablet 0.1 mg (has no administration in time range)  feeding supplement (ENSURE ENLIVE) (ENSURE ENLIVE) liquid 237 mL (has no administration in time range)  furosemide (LASIX) tablet 40 mg (has no administration in time range)  isosorbide dinitrate (ISORDIL) tablet 30 mg (30 mg Oral Given 05/07/18 0036)  citalopram (CELEXA) tablet 10 mg (has no administration in time range)  amLODipine (NORVASC) tablet 10 mg (has no administration in time range)  ALPRAZolam (XANAX) tablet 0.5 mg (0.5 mg Oral Given 05/06/18 2351)  hydrALAZINE (APRESOLINE) tablet 100 mg (100 mg Oral Given 5/69/79 4801)  folic acid (FOLVITE) tablet 1 mg (has no administration in time range)  ondansetron (ZOFRAN) tablet 4 mg (has no administration in time range)    Or  ondansetron (ZOFRAN) injection 4 mg (has no administration in time range)  heparin injection 5,000 Units (5,000 Units Subcutaneous Given 05/07/18 0035)  acetaminophen (TYLENOL) tablet 650 mg (650 mg Oral Given 05/07/18 0306)  sodium polystyrene (KAYEXALATE) 15 GM/60ML suspension 30 g (30 g Oral Given 05/06/18 2120)  calcium gluconate 1 g in sodium chloride 0.9 % 100 mL IVPB (0 g Intravenous Stopped 05/06/18 2243)  sodium bicarbonate injection 50 mEq (50 mEq  Intravenous Given 05/06/18 2225)  furosemide (LASIX) injection 40 mg (40 mg Intravenous Given 05/06/18 2227)  dextrose 50 % solution 50 mL (50 mLs Intravenous Given 05/06/18 2227)  insulin aspart (novoLOG) injection 5 Units (5 Units Intravenous Given 05/06/18 2228)  cloNIDine (CATAPRES) tablet 0.1 mg (0.1 mg Oral Given 05/06/18 2243)  sodium chloride 0.9 % bolus 500 mL (0 mLs Intravenous Stopped 05/06/18 2351)    Mobility walks with person assist

## 2018-05-07 NOTE — Progress Notes (Signed)
Sarah Harmon is a 72 y.o. female patient admitted from ED awake, alert - oriented  X 4 - no acute distress noted.  Orientation to room, and floor completed with information packet given to patient/family.  Patient declined safety video at this time.  Admission INP armband ID verified with patient/family, and in place.   SR up x 2, fall assessment complete, with patient and family able to verbalize understanding of risk associated with falls, and verbalized understanding to call nsg before up out of bed.  Call light within reach, patient able to voice, and demonstrate understanding.  Skin, clean-dry- intact without evidence of bruising, or skin tears.   No evidence of skin break down noted on exam.   Will cont to eval and treat per MD orders.  Richardean Chimera, RN 05/07/2018 6:03 PM

## 2018-05-07 NOTE — Progress Notes (Signed)
[facility] Rounding Note  Background: 69F with late stage CKD5 (presumed HTN, nearing dialysis need), cirrhosis (combination alcoholic and autoimmune etiology; no clear history of portal hypertension, ascites ), h/o [place] s/p radioablation 04/2015; follows with Ky Barban; MRI 10/2017 w/o recurrence), mild hyponatremia Na 130s usually. Presented to [facility] 5/20 with 21 lb weight loss, K 7.1, Na 122, creatinine 3.78 and sent to ED. Labs here a little better, looked dry. Evaluated by Dr. Joelyn Oms. Med Rx for ^K and 500 cc NS bolus given.  Aldactone stopped.  Assessment/Recommendations  1. Hyperkalemia - medically managed in the ED,  improved this AM. Driven by low GFR, diet, aldactone (stopped). Avoid Ensure (K content), use Nepro for supplement 2. Hyponatremia, acute on Chronic- Was felt by Dr. Joelyn Oms possibly hypovolemic hypotonic hyponatremia. Somewhat better after NS bolus of 500 cc last PM. No continuous fluids at this time. I see lasix on med list - will stop this. 3. Anemia - TSat 19% on office labs 5/20. Will give dose of Aranesp 60.  4. Progressive CKD5. Last 3 creatinines outpt 01/2018 3.07 GFR 17, 03/21/18 3.27 GFR 15 (Na 134), 05/06/18 3.78 GFR 13 and today is 3.56; Probably with some uremia but is marginal HD candidate. She understands that HD is in her future, not sure we need to start right now. WILL go ahead and get some vein mapping done while here.  5. Cirrhosis, autoimmune +/- alcohol; hx/o [place] s/p ablation follows with Ky Barban; no recurrence 10/2017 MRI 6. 20lb weight loss 85mo; poor nutrition, likely related #4. Curiously is eating ravenously right now, unclear to me if clinically uremic based on current exam 7. Bradycardia, on BB, AFib with SVR. ? If elevated K was contributing. HR still 60's 8. HTN, elevated BP in the ED, primary added hydralazine and amlodipine, looks quite good at this time.   Dispo - Awake, alert, tolerating po well, Na and K have both improved some, no asterixis.  Creatinine just  a touch better.  If stable as such tomorrow could be discharged with further HD arrangements as outpt. Will do vein mapping while here to get that out of the way.     Jamal Maes, MD [place] Pager 05/07/2018, 1:10 PM  Subjective/Interval History:  Spent the night in the ED Currently in a room., awake, alert, eating her lunch and says really hungry Said had 1 episode vomiting, no nausea "feels pretty good"  Objective Vital signs in last 24 hours: Vitals:   05/07/18 0530 05/07/18 0600 05/07/18 0630 05/07/18 0700  BP: 129/68 128/69 132/66 136/66  Pulse: 61 61 (!) 59 60  Resp: 19 (!) 21 (!) 24 20  Temp:      TempSrc:      SpO2: 99% 100% 100% 96%   Weight change:   Intake/Output Summary (Last 24 hours) at 05/07/2018 0729 Last data filed at 05/06/2018 2351 Gross per 24 hour  Intake 500 ml  Output -  Net 500 ml   Physical Exam:  Blood pressure 136/66, pulse 60, temperature 97.9 F (36.6 C), temperature source Oral, resp. rate 20, SpO2 96 %.   Very nice thin AAF NAD VS as noted Eating lunch and says is hungry Lungs are clear Loletha Grayer, regular, S1S2 No S3 Abd soft and not tender No edema whatsoever No asterixis Oriented, participates in concersation   Recent Labs  Lab 05/06/18 1923 05/07/18 0309  NA 123* 127*  K 6.7* 4.8  CL 93* 95*  CO2 21* 19*  GLUCOSE 118*  98  BUN 42* 39*  CREATININE 3.67* 3.61*  CALCIUM 9.5 9.5    Recent Labs  Lab 05/07/18 0309  AST 24  ALT 13*  ALKPHOS 38  BILITOT 0.8  PROT 7.4  ALBUMIN 3.5    Recent Labs  Lab 05/06/18 1923 05/07/18 0309  WBC 3.6* 4.8  HGB 9.3* 9.1*  HCT 28.3* 27.1*  MCV 87.6 86.0  PLT 145* 134*   Medications:  . ALPRAZolam  0.5 mg Oral QHS  . amLODipine  10 mg Oral Daily  . citalopram  10 mg Oral Daily  . cloNIDine  0.1 mg Oral Daily  . feeding supplement (ENSURE ENLIVE)  237 mL Oral TID BM  . folic acid  1 mg Oral Daily  . furosemide  40 mg Oral Daily  . heparin  5,000  Units Subcutaneous Q8H  . hydrALAZINE  100 mg Oral TID  . isosorbide dinitrate  30 mg Oral TID  . lactulose  20 g Oral Daily  . propranolol  40 mg Oral BID  . Vitamin D3  1,000 Units Oral q morning - 10a

## 2018-05-08 DIAGNOSIS — D631 Anemia in chronic kidney disease: Secondary | ICD-10-CM

## 2018-05-08 DIAGNOSIS — N186 End stage renal disease: Secondary | ICD-10-CM

## 2018-05-08 DIAGNOSIS — N185 Chronic kidney disease, stage 5: Secondary | ICD-10-CM

## 2018-05-08 DIAGNOSIS — N189 Chronic kidney disease, unspecified: Secondary | ICD-10-CM

## 2018-05-08 LAB — CBC
HEMATOCRIT: 21.5 % — AB (ref 36.0–46.0)
HEMATOCRIT: 21.8 % — AB (ref 36.0–46.0)
HEMOGLOBIN: 7.4 g/dL — AB (ref 12.0–15.0)
HEMOGLOBIN: 7.5 g/dL — AB (ref 12.0–15.0)
MCH: 29.7 pg (ref 26.0–34.0)
MCH: 29.5 pg (ref 26.0–34.0)
MCHC: 34.4 g/dL (ref 30.0–36.0)
MCHC: 34.4 g/dL (ref 30.0–36.0)
MCV: 86.3 fL (ref 78.0–100.0)
MCV: 85.8 fL (ref 78.0–100.0)
Platelets: 111 10*3/uL — ABNORMAL LOW (ref 150–400)
Platelets: 95 10*3/uL — ABNORMAL LOW (ref 150–400)
RBC: 2.49 MIL/uL — AB (ref 3.87–5.11)
RBC: 2.54 MIL/uL — AB (ref 3.87–5.11)
RDW: 12.7 % (ref 11.5–15.5)
RDW: 12.8 % (ref 11.5–15.5)
WBC: 2.3 10*3/uL — ABNORMAL LOW (ref 4.0–10.5)
WBC: 2.2 10*3/uL — ABNORMAL LOW (ref 4.0–10.5)

## 2018-05-08 LAB — RENAL FUNCTION PANEL
ANION GAP: 9 (ref 5–15)
Albumin: 2.7 g/dL — ABNORMAL LOW (ref 3.5–5.0)
BUN: 45 mg/dL — ABNORMAL HIGH (ref 6–20)
CALCIUM: 8.6 mg/dL — AB (ref 8.9–10.3)
CO2: 25 mmol/L (ref 22–32)
Chloride: 93 mmol/L — ABNORMAL LOW (ref 101–111)
Creatinine, Ser: 3.92 mg/dL — ABNORMAL HIGH (ref 0.44–1.00)
GFR calc Af Amer: 12 mL/min — ABNORMAL LOW (ref >60)
GFR calc non Af Amer: 10 mL/min — ABNORMAL LOW (ref >60)
Glucose, Bld: 86 mg/dL (ref 65–99)
POTASSIUM: 4 mmol/L (ref 3.5–5.1)
Phosphorus: 4.5 mg/dL (ref 2.5–4.6)
SODIUM: 127 mmol/L — AB (ref 135–145)

## 2018-05-08 LAB — ABO/RH: ABO/RH(D): O POS

## 2018-05-08 LAB — PREPARE RBC (CROSSMATCH)

## 2018-05-08 MED ORDER — SODIUM CHLORIDE 0.9 % IV SOLN
510.0000 mg | Freq: Once | INTRAVENOUS | Status: AC
  Administered 2018-05-08: 510 mg via INTRAVENOUS
  Filled 2018-05-08: qty 17

## 2018-05-08 MED ORDER — WHITE PETROLATUM EX OINT
TOPICAL_OINTMENT | CUTANEOUS | Status: AC
  Administered 2018-05-08: 0.2
  Filled 2018-05-08: qty 28.35

## 2018-05-08 MED ORDER — SODIUM CHLORIDE 0.9 % IV SOLN
Freq: Once | INTRAVENOUS | Status: AC
  Administered 2018-05-08: 13:00:00 via INTRAVENOUS

## 2018-05-08 NOTE — Progress Notes (Addendum)
PROGRESS NOTE    MINELA BRIDGEWATER  RSW:[id number] DOB: 10-28-1946 DOA: 05/06/2018 PCP: Biagio Borg, MD Outpatient Specialists:  Hematology/Dr. Truitt Merle   Brief Narrative:  Patient is a 72 y.o. female with prior history of chronic kidney disease stage IV-V, cirrhosis, history of hepatocellular cancer status post radioablation in 2016, chronic hyponatremia-sent to the ED for evaluation of hyperkalemia and hyponatremia.Hemoblobin drop on 5/22, no active source at this time.  See below for further details   Assessment & Plan:   Principal Problem:   Hyperkalemia Active Problems:   Pancytopenia (HCC)   Hyponatremia   Hypertensive urgency   Acute renal failure superimposed on stage 3 chronic kidney disease (HCC)  Hyperkalemia: Secondary to possible Aldactone use (per nephrology) and use of supplemental use of ensure shakes at home in the background of chronic kidney disease stage IV-V.  Resolved with Kayexalate/IV sodium bicarbonate.  Avoid Aldactone and ensure shakes, with use of Nepro shakes instead, in the future.  Hyponatremia: Appears to have chronic hyponatremia-improved with IV fluids-suspected to have hypovolemic hypotonic hyponatremia.  Does have cirrhosis at baseline as well.  No longer on IV fluids, remains on Lasix.  Urology consulting-await formal evaluation.  Chronic kidney disease stage IV-V: Nephrology consulted [facility] today for future access procedure in the near future.  Currently do not see any acute indications.  Hypertension: Controlled-continue with hydralazine, isordil, clonidine and amlodipine.  Hepatocellular carcinoma/alcoholic liver cirrhosis: Continue outpatient follow-up with Dr. Richardo Priest CT-guided microwave ablation in 2016.  Anemia: Likely secondary to chronic disease, new 2 gram drop in HGB on 5/22, no active source of bleeding. Per Nephrology will give one units PRBC and dose of Feraheme today.  One reported dark stool. follow for now if stools  continue plan to hemoccult stools.   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   Objective: Vitals:   05/07/18 2144 05/07/18 2251 05/08/18 0523 05/08/18 0954  BP:  119/64 (!) 122/59 (!) 150/67  Pulse: 64 64 61 62  Resp:   12   Temp:   98.1 F (36.7 C)   TempSrc:   Oral   SpO2:  100% 98%     Intake/Output Summary (Last 24 hours) at 05/08/2018 1304 Last data filed at 05/08/2018 0900 Gross per 24 hour  Intake 477 ml  Output -  Net 477 ml   There were no vitals filed for this visit.  Examination: BP (!) 150/67   Pulse 62   Temp 98.1 F (36.7 C) (Oral)   Resp 12   SpO2 98%   General exam: Appears calm and comfortable  Respiratory system: Clear to auscultation. Respiratory effort normal. Cardiovascular system: S1 & S2 heard, RRR. No JVD, murmurs, rubs, gallops or clicks. No pedal edema. Gastrointestinal system: Abdomen is nondistended, soft and nontender. No organomegaly or masses felt. Normal bowel sounds heard. Central nervous system: Alert and oriented. No focal neurological deficits. Extremities: Symmetric 5 x 5 power. Skin: No rashes,  lesions or ulcers Psychiatry: Judgement and insight appear normal. Mood & affect appropriate.     Data Reviewed: I have personally reviewed following labs and imaging studies  CBC: Recent Labs  Lab 05/06/18 1923 05/07/18 0309 05/08/18 0509 05/08/18 0855  WBC 3.6*  4.8 2.3* 2.2*  HGB 9.3* 9.1* 7.4* 7.5*  HCT 28.3* 27.1* 21.5* 21.8*  MCV 87.6 86.0 86.3 85.8  PLT 145* 134* 111* 95*   Basic Metabolic Panel: Recent Labs  Lab 05/06/18 1923 05/07/18 0309 05/07/18 0852 05/08/18 0509  NA 123* 127* 125* 127*  K 6.7* 4.8 4.1 4.0  CL 93* 95* 93* 93*  CO2 21* 19* 23 25  GLUCOSE 118* 98 128* 86  BUN 42* 39* 39* 45*  CREATININE 3.67* 3.61* 3.56* 3.92*  CALCIUM 9.5 9.5 9.1 8.6*  PHOS  --   --   --  4.5   GFR: CrCl cannot be calculated (Unknown ideal weight.). Liver Function Tests: Recent Labs  Lab 05/07/18 0309 05/08/18 0509  AST 24  --   ALT 13*  --   ALKPHOS 38  --   BILITOT 0.8  --   PROT 7.4  --   ALBUMIN 3.5 2.7*   No results for input(s): LIPASE, AMYLASE in the last 168 hours. No results for input(s): AMMONIA in the last 168 hours. Coagulation Profile: No results for input(s): INR, PROTIME in the last 168 hours. Cardiac Enzymes: No results for input(s): CKTOTAL, CKMB, CKMBINDEX, TROPONINI in the last 168 hours. BNP (last 3 results) No results for input(s): PROBNP in the last 8760 hours. HbA1C: No results for input(s): HGBA1C in the last 72 hours. CBG: Recent Labs  Lab 05/07/18 1106  GLUCAP 128*   Lipid Profile: No results for input(s): CHOL, HDL, LDLCALC, TRIG, CHOLHDL, LDLDIRECT in the last 72 hours. Thyroid Function Tests: No results for input(s): TSH, T4TOTAL, FREET4, T3FREE, THYROIDAB in the last 72 hours. Anemia Panel: Recent Labs    05/07/18 0852  TIBC 295  IRON 33   Urine analysis:    Component Value Date/Time   COLORURINE YELLOW 09/10/2017 0605   APPEARANCEUR HAZY (A) 09/10/2017 0605   LABSPEC 1.014 09/10/2017 0605   PHURINE 6.0 09/10/2017 0605   GLUCOSEU NEGATIVE 09/10/2017 0605   GLUCOSEU NEGATIVE 09/04/2017 0943   HGBUR NEGATIVE 09/10/2017 0605   BILIRUBINUR NEGATIVE 09/10/2017 0605   KETONESUR NEGATIVE 09/10/2017 0605   PROTEINUR >=300 (A) 09/10/2017 0605   UROBILINOGEN 0.2 09/04/2017 0943    NITRITE NEGATIVE 09/10/2017 0605   LEUKOCYTESUR NEGATIVE 09/10/2017 0605   Sepsis Labs: @LABRCNTIP (procalcitonin:4,lacticidven:4)  )No results found for this or any previous visit (from the past 240 hour(s)).    Radiology Studies: No results found.   Scheduled Meds: . ALPRAZolam  0.5 mg Oral QHS  . amLODipine  10 mg Oral Daily  . cholecalciferol  1,000 Units Oral Daily  . citalopram  10 mg Oral Daily  . cloNIDine  0.1 mg Oral Daily  . darbepoetin (ARANESP) injection - NON-DIALYSIS  60 mcg Subcutaneous Q Tue-1800  . feeding supplement (NEPRO CARB STEADY)  237 mL Oral BID BM  . folic acid  1 mg Oral Daily  . hydrALAZINE  100 mg Oral TID  . isosorbide dinitrate  30 mg Oral TID  . lactulose  20 g Oral Daily  . propranolol  40 mg Oral BID   Continuous Infusions:   LOS: 2 days    Time spent: 25- minutes-Greater than 50% of this time was spent in counseling, explanation of diagnosis,  planning of further management, and coordination of care.    Johnsie Cancel, NP [facility]  If 7PM-7AM, please contact night-coverage [email or web address] Password [facility] 05/08/2018, 1:04 PM

## 2018-05-08 NOTE — Evaluation (Signed)
Physical Therapy Evaluation Patient Details Name: Sarah Harmon MRN: [id number] DOB: 28-Sep-1946 Today's Date: 05/08/2018   History of Present Illness  72 y.o. female with prior history of chronic kidney disease stage IV-V, cirrhosis, history of hepatocellular cancer status post radioablation in 2016, chronic hyponatremia-sent to the ED for evaluation of hyperkalemia and hyponatremia.  Clinical Impression  PTA pt lives alone and is independent in community level ambulation without AD, depends on daughter to get to store and appointments, but independent in ADLs. Pt currently independent with bed mobility, and transfers, supervision with ambulation of 400 feet without AD, and min guard for ascent/descent of 4 steps. Pt is near her baseline level of care and will continue to benefit from the intermittent supervision her family provides. PT does not see any need for additional therapy or equipment at this time. PT signing off.     Follow Up Recommendations No PT follow up;Supervision - Intermittent    Equipment Recommendations  None recommended by PT    Recommendations for Other Services       Precautions / Restrictions Precautions Precautions: None Restrictions Weight Bearing Restrictions: No      Mobility  Bed Mobility Overal bed mobility: Independent                Transfers Overall transfer level: Independent               General transfer comment: good power up and steadying in standing   Ambulation/Gait Ambulation/Gait assistance: Supervision Ambulation Distance (Feet): 400 Feet Assistive device: None Gait Pattern/deviations: Step-through pattern;Shuffle;Drifts right/left Gait velocity: WFL Gait velocity interpretation: >2.62 ft/sec, indicative of community ambulatory General Gait Details: supervision for safety, overall steady gait with mild drift right and left over long distance. no LoB   Stairs Stairs: Yes Stairs assistance: Min guard Stair  Management: Two rails;Forwards;Alternating pattern Number of Stairs: 4 General stair comments: min guard for safety with ascent/descent   Wheelchair Mobility    Modified Rankin (Stroke Patients Only)       Balance Overall balance assessment: Mild deficits observed, not formally tested                                           Pertinent Vitals/Pain Pain Assessment: No/denies pain    Home Living Family/patient expects to be discharged to:: Private residence Living Arrangements: Alone Available Help at Discharge: Family;Available PRN/intermittently(daughter) Type of Home: House Home Access: Stairs to enter Entrance Stairs-Rails: Right;Left;Can reach both Entrance Stairs-Number of Steps: 4 Home Layout: One level Home Equipment: Walker - 2 wheels;Shower seat;Hand held shower head      Prior Function Level of Independence: Independent                  Extremity/Trunk Assessment   Upper Extremity Assessment Upper Extremity Assessment: Overall WFL for tasks assessed    Lower Extremity Assessment Lower Extremity Assessment: Overall WFL for tasks assessed       Communication   Communication: No difficulties  Cognition Arousal/Alertness: Awake/alert Behavior During Therapy: WFL for tasks assessed/performed Overall Cognitive Status: Within Functional Limits for tasks assessed                                        General Comments General comments (skin integrity, edema, etc.): VSS  Exercises     Assessment/Plan    PT Assessment Patent does not need any further PT services  PT Problem List         PT Treatment Interventions      PT Goals (Current goals can be found in the Care Plan section)  Acute Rehab PT Goals Patient Stated Goal: see her new great-grand baby PT Goal Formulation: With patient     AM-PAC PT "6 Clicks" Daily Activity  Outcome Measure Difficulty turning over in bed (including adjusting  bedclothes, sheets and blankets)?: None Difficulty moving from lying on back to sitting on the side of the bed? : None Difficulty sitting down on and standing up from a chair with arms (e.g., wheelchair, bedside commode, etc,.)?: None Help needed moving to and from a bed to chair (including a wheelchair)?: None Help needed walking in hospital room?: None Help needed climbing 3-5 steps with a railing? : A Little 6 Click Score: 23    End of Session Equipment Utilized During Treatment: Gait belt Activity Tolerance: Patient tolerated treatment well Patient left: in bed;with call bell/phone within reach;with bed alarm set Nurse Communication: Mobility status      Time: 1150-1210 PT Time Calculation (min) (ACUTE ONLY): 20 min   Charges:   PT Evaluation $PT Eval Low Complexity: 1 Low     PT G Codes:        Fard Borunda B. Migdalia Dk PT, DPT Acute Rehabilitation  [phone] Pager [phone]    [place] 05/08/2018, 1:45 PM

## 2018-05-08 NOTE — Progress Notes (Signed)
[facility] Rounding Note  Background: 34F with late stage CKD5 (presumed HTN, nearing dialysis need), cirrhosis (combination alcoholic and autoimmune etiology; no clear history of portal hypertension, ascites ), h/o [place] s/p radioablation 04/2015; follows with Ky Barban; MRI 10/2017 w/o recurrence), mild hyponatremia Na 130s usually. Presented to [facility] 5/20 with weight loss, K 7.1, Na 122, creatinine 3.78 and sent to ED. Labs here a little better, looked dry. Evaluated by Dr. Joelyn Oms. Med Rx for ^K and 500 cc NS bolus given.  Aldactone stopped.  Assessment/Recommendations  1. Hyperkalemia - resolved with med Rx. Driven by low GFR, diet, aldactone (stopped). Avoid Ensure (K content), use Nepro for supplement.  2. Hyponatremia, acute on mild chronic- hypovolemic hypotonic hyponatremia. Improved since admission. Had 500 cc NS bolus in the ED. I see lasix on med list - I stopped (she is dry if anything) 3. Anemia - Rather large Hb drop past 24 hours to 7's confirmed on repeat. TSat low at 11 Feraheme ordered. Rec'd (only) 60 of Aranesp yesterday (based on Hb then of 9.1). Given age and frailty, I would be inclined to transfuse 1 unit.  4. Progressive CKD5. Last 3 creatinines outpt 01/2018 3.07 GFR 17, 03/21/18 3.27 GFR 15 (Na 134), 05/06/18 3.78 GFR 13, today 3.92. Probably with some uremia but looks pretty good right now. She understands that HD is in her future, not sure we need to start right now. WILL go ahead and get some vein mapping done while here, ask [facility] to see about access in near future.  5. Cirrhosis, autoimmune +/- alcohol; hx/o [place] s/p ablation follows with Ky Barban; no recurrence 10/2017 MRI 6. Bradycardia, on BB, AFib with SVR. ? If elevated K was contributing. HR still 60's 7. HTN, elevated BP in the ED, primary added hydralazine and amlodipine, looks quite good at this time.  Disposition: I would propose that she get a 1 unit transfusion for Hb 7.4, a dose of Feraheme, for low TSat of 11, get vein mapping  done (ordered not dopne yet), ask [facility] to see to get access procedure on the books for the near future (I have called them). Once all that accomplished I think could then f/u in the office with Dr. Moshe Cipro.  Jamal Maes, MD [facility] [phone] Pager 05/08/2018, 12:09 PM  Subjective/Interval History:  Up walking in the halls with PT No complaints Eating without nausea  Considerable drop in Hb past 24 hours to 7.4 Got dose of Aranesp yesterday 60 mcg (based on Hb in the 9's), to get dose Feraheme today for low tsat  Vein mapping not yet done but ordered  Objective Vital signs in last 24 hours: Vitals:   05/07/18 2144 05/07/18 2251 05/08/18 0523 05/08/18 0954  BP:  119/64 (!) 122/59 (!) 150/67  Pulse: 64 64 61 62  Resp:   12   Temp:   98.1 F (36.7 C)   TempSrc:   Oral   SpO2:  100% 98%    Weight change:   Intake/Output Summary (Last 24 hours) at 05/08/2018 1209 Last data filed at 05/08/2018 0900 Gross per 24 hour  Intake 477 ml  Output -  Net 477 ml   Physical Exam:  Blood pressure (!) 150/67, pulse 62, temperature 98.1 F (36.7 C), temperature source Oral, resp. rate 12, SpO2 98 %.   Very nice thin AAF NAD Denies any pain, SOB, nausea - says feels "pretty good"  VS as noted Has been walking with PT Lungs are clear Loletha Grayer, regular,  S1S2 No S3 Abd soft and not tender No edema No asterixis Oriented, awake and alert   Recent Labs  Lab 05/06/18 1923 05/07/18 0309 05/07/18 0852 05/08/18 0509  NA 123* 127* 125* 127*  K 6.7* 4.8 4.1 4.0  CL 93* 95* 93* 93*  CO2 21* 19* 23 25  GLUCOSE 118* 98 128* 86  BUN 42* 39* 39* 45*  CREATININE 3.67* 3.61* 3.56* 3.92*  CALCIUM 9.5 9.5 9.1 8.6*  PHOS  --   --   --  4.5    Recent Labs  Lab 05/07/18 0309 05/08/18 0509  AST 24  --   ALT 13*  --   ALKPHOS 38  --   BILITOT 0.8  --   PROT 7.4  --   ALBUMIN 3.5 2.7*    Recent Labs  Lab 05/06/18 1923 05/07/18 0309 05/08/18 0509 05/08/18 0855   WBC 3.6* 4.8 2.3* 2.2*  HGB 9.3* 9.1* 7.4* 7.5*  HCT 28.3* 27.1* 21.5* 21.8*  MCV 87.6 86.0 86.3 85.8  PLT 145* 134* 111* 95*   Results for JESSIE, COWHER (MRN [id number]) as of 05/08/2018 12:08  05/07/2018 08:52  Iron 33  UIBC 262  TIBC 295  Saturation Ratios 11    Medications: . ferumoxytol     . ALPRAZolam  0.5 mg Oral QHS  . amLODipine  10 mg Oral Daily  . cholecalciferol  1,000 Units Oral Daily  . citalopram  10 mg Oral Daily  . cloNIDine  0.1 mg Oral Daily  . darbepoetin (ARANESP) injection - NON-DIALYSIS  60 mcg Subcutaneous Q Tue-1800  . feeding supplement (NEPRO CARB STEADY)  237 mL Oral BID BM  . folic acid  1 mg Oral Daily  . hydrALAZINE  100 mg Oral TID  . isosorbide dinitrate  30 mg Oral TID  . lactulose  20 g Oral Daily  . propranolol  40 mg Oral BID

## 2018-05-08 NOTE — Care Management Note (Signed)
Case Management Note  Patient Details  Name: Sarah Harmon MRN: [id number] Date of Birth: [birth year]  Subjective/Objective:    Admitted with  hyperkalemia and hyponatremia hx of     chronic kidney disease stage IV-V, cirrhosis, hepatocellular cancer status post radioablation in 2016, chronic hyponatremia. Resides alone. Independent with ADL's , no DME usage PTA.         Evorn Gong (Daughter) [phone]          PCP: Cathlean Cower  Action/Plan: Transition to home when medically stable.  Expected Discharge Date:                  Expected Discharge Plan:  Home/Self Care  In-House Referral:     Discharge planning Services  CM Consult  Post Acute Care Choice:    Choice offered to:     DME Arranged:   N/A DME Agency:    N/A HH Arranged:   N/A HH Agency:   N/A  Status of Service:  Completed, signed off  If discussed at [place] of Stay Meetings, dates discussed:    Additional Comments:  Sharin Mons, RN 05/08/2018, 12:15 PM

## 2018-05-08 NOTE — Consult Note (Addendum)
Hospital Consult    Reason for Consult:  In need of dialysis access Requesting Physician:  Moshe Cipro MRN #:  [id number]  History of Present Illness: This is a 72 y.o. female who was admitted a couple of days ago after having weakness and worsening renal function and hyperkalemia.  She has a hx of CKD 3 and now AKI on CKD.  She has hx of Etoh abuse and hx of hepatocellular carcinoma.    [facility] is consulted for permanent access placement.  She is not yet on hemodialysis.  BUE vein mapping has been ordered but not done.  She is right hand dominant.    Past Medical History:  Diagnosis Date  . ALCOHOL ABUSE 07/13/2010  . ANXIETY 12/22/2009  . DEPRESSION 12/22/2009  . 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 DJD (degenerative joint disease), cervical    patient denies on preop of 05/10/15   . FATIGUE 12/23/2010  . GALLSTONES 12/22/2009  . Gallstones   . Headache    occasional   . HYPERLIPIDEMIA 12/22/2009  . HYPERTENSION 12/22/2009  . HYPONATREMIA 12/22/2009  . 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 OSTEOPENIA 12/22/2009  . Pancytopenia 07/13/2010  . RENAL CYST, LEFT 12/22/2009  . VITAMIN D DEFICIENCY 12/23/2010  . WEIGHT LOSS 12/23/2010    Past Surgical History:  Procedure Laterality Date  . ABDOMINAL HYSTERECTOMY    . IR GENERIC HISTORICAL  10/18/2016   IR RADIOLOGIST EVAL & MGMT 10/18/2016 [name], MD [facility] INTERV RAD  . IR RADIOLOGIST EVAL & MGMT  05/30/2017    No Known Allergies  Prior to Admission medications   Medication Sig Start Date End Date Taking? Authorizing Provider  ALPRAZolam Duanne Moron) 0.5 MG tablet TAKE 1 TABLET BY MOUTH ONCE DAILY AT BEDTIME 03/27/18  Yes Biagio Borg, MD  amLODipine (NORVASC) 10 MG tablet TAKE 1 TABLET BY MOUTH ONCE DAILY 03/11/18  Yes Biagio Borg, MD  Cholecalciferol (VITAMIN D3) 1000 UNITS CAPS Take 10,000 Units by mouth daily.    Yes [provider]  citalopram (CELEXA) 20 MG tablet TAKE ONE-HALF TABLET BY MOUTH ONCE DAILY 02/14/18  Yes Biagio Borg, MD  cloNIDine (CATAPRES) 0.1 MG tablet Take 1 tablet (0.1 mg total) by mouth daily. 09/18/17  Yes Hosie Poisson, MD  feeding supplement, ENSURE ENLIVE, (ENSURE ENLIVE) LIQD Take 237 mLs by mouth 3 (three) times daily between meals. 09/17/17  Yes Hosie Poisson, MD  folic acid (FOLVITE) 1 MG tablet TAKE 1 TABLET BY MOUTH ONCE DAILY 04/16/18  Yes Biagio Borg, MD  furosemide (LASIX) 40 MG tablet Take 1 tablet (40 mg total) by mouth daily. 09/18/17  Yes Hosie Poisson, MD  hydrALAZINE (APRESOLINE) 100 MG tablet TAKE 1 TABLET BY MOUTH THREE TIMES DAILY 04/09/18  Yes Biagio Borg, MD  isosorbide dinitrate (ISORDIL) 30 MG tablet Take 1 tablet (30 mg total) by mouth 3 (three) times daily. 09/17/17  Yes Hosie Poisson, MD  lactulose (CHRONULAC) 10 GM/15ML solution Take 30 mLs (20 g total) by mouth daily. 09/17/17  Yes Biagio Borg, MD  propranolol (INDERAL) 40 MG tablet Take 1 tablet (40 mg total) by mouth 2 (two) times daily. 09/04/17  Yes Biagio Borg, MD    Social History   Socioeconomic History  . Marital status: Widowed    Spouse name: Not on file  . Number of children: 4  . Years of education: Not on file  . Highest education level: Not on file  Occupational History  . Occupation: retired  Social Needs  . Financial resource strain: Not on file  . Food insecurity:    Worry: Not on file    Inability: Not on file  . Transportation needs:    Medical: Not on file    Non-medical: Not on file  Tobacco Use  . Smoking status: Never Smoker  . Smokeless tobacco: Never Used  Substance and Sexual Activity  . Alcohol use: Yes    Alcohol/week: 1.8 - 2.4 oz    Types: 3 - 4 Cans of beer per week    Comment: she quit 02/2015   . Drug use: No  . Sexual activity: Not on file  Lifestyle  . Physical activity:    Days per week: Not on file    Minutes per session: Not on file  . Stress: Not on file  Relationships  . Social connections:    Talks on phone: Not on file    Gets together: Not on  file    Attends religious service: Not on file    Active member of club or organization: Not on file    Attends meetings of clubs or organizations: Not on file    Relationship status: Not on file  . Intimate partner violence:    Fear of current or ex partner: Not on file    Emotionally abused: Not on file    Physically abused: Not on file    Forced sexual activity: Not on file  Other Topics Concern  . Not on file  Social History Narrative   Widowed, grown son lives with her   Retired from Manufacturing engineer   Has total of #5 children   Enjoys watching TV and planting flowers     Family History  Problem Relation Age of Onset  . Lung cancer Brother   . Breast cancer Other   . Breast cancer Sister   . Gastric cancer Sister   . Lung cancer Sister   . Colon cancer Neg Hx   . Colon polyps Neg Hx   . Rectal cancer Neg Hx   . Stomach cancer Neg Hx   . Esophageal cancer Neg Hx     ROS: [x]  Positive   [ ]  Negative   [ ]  All sytems reviewed and are negative  Cardiac: []  chest pain/pressure []  palpitations []  SOB lying flat []  DOE  Vascular: []  pain in legs while walking []  pain in legs at rest []  pain in legs at night []  non-healing ulcers []  hx of DVT []  swelling in legs  Pulmonary: []  productive cough []  asthma/wheezing []  home O2  Neurologic: []  weakness in []  arms []  legs []  numbness in []  arms []  legs []  hx of CVA []  mini stroke [] difficulty speaking or slurred speech []  temporary loss of vision in one eye []  dizziness  Hematologic: [x]  hx of cancer-hepatocellular []  bleeding problems []  problems with blood clotting easily  Endocrine:   []  diabetes []  thyroid disease  GI []  vomiting blood []  blood in stool [x]  cirrhosis   GU: [x]  CKD/renal failure []  HD--[]  M/W/F or []  T/T/S []  burning with urination []  blood in urine  Psychiatric: [x]  anxiety []  depression  Musculoskeletal: []  arthritis []  joint pain [x]   DJD  Integumentary: []  rashes []  ulcers  Constitutional: []  fever []  chills   Physical Examination  Vitals:   05/08/18 0954 05/08/18 1324  BP: (!) 150/67 (!) 120/56  Pulse: 62 (!) 56  Resp:  16  Temp:  98.6 F (37 C)  SpO2:  100%   There is no height or weight on file to calculate BMI.  General:  WDWN in NAD Gait: Not observed HENT: WNL, normocephalic Pulmonary: normal non-labored breathing, without Rales, rhonchi,  wheezing Cardiac: regular, with  Murmur without carotid bruits Abdomen:  soft, NT/ND, no masses Skin: without rashes Vascular Exam/Pulses:  Right Left  Radial 2+ (normal) 2+ (normal)  Ulnar Unable to palpate  Unable to palpate   DP 2+ (normal) 2+ (normal)  PT Unable to palpate  Unable to palpate    Extremities: without ischemic changes, without Gangrene , without cellulitis; without open wounds;  Musculoskeletal: no muscle wasting or atrophy  Neurologic: A&O X 3;  No focal weakness or paresthesias are detected; speech is fluent/normal Psychiatric:  The pt has Normal affect.   CBC    Component Value Date/Time   WBC 2.2 (L) 05/08/2018 0855   RBC 2.54 (L) 05/08/2018 0855   HGB 7.5 (L) 05/08/2018 0855   HGB 11.2 (L) 10/31/2017 0903   HCT 21.8 (L) 05/08/2018 0855   HCT 34.2 (L) 10/31/2017 0903   PLT 95 (L) 05/08/2018 0855   PLT 123 (L) 10/31/2017 0903   MCV 85.8 05/08/2018 0855   MCV 86.4 10/31/2017 0903   MCH 29.5 05/08/2018 0855   MCHC 34.4 05/08/2018 0855   RDW 12.8 05/08/2018 0855   RDW 15.3 (H) 10/31/2017 0903   LYMPHSABS 1.1 02/07/2018 0752   LYMPHSABS 0.7 (L) 10/31/2017 0903   MONOABS 0.4 02/07/2018 0752   MONOABS 0.5 10/31/2017 0903   EOSABS 0.3 02/07/2018 0752   EOSABS 0.4 10/31/2017 0903   BASOSABS 0.1 02/07/2018 0752   BASOSABS 0.0 10/31/2017 0903    BMET    Component Value Date/Time   NA 127 (L) 05/08/2018 0509   NA 137 10/31/2017 0903   K 4.0 05/08/2018 0509   K 4.5 10/31/2017 0903   CL 93 (L) 05/08/2018 0509   CO2  25 05/08/2018 0509   CO2 24 10/31/2017 0903   GLUCOSE 86 05/08/2018 0509   GLUCOSE 92 10/31/2017 0903   BUN 45 (H) 05/08/2018 0509   BUN 27.4 (H) 10/31/2017 0903   CREATININE 3.92 (H) 05/08/2018 0509   CREATININE 3.2 (HH) 10/31/2017 0903   CALCIUM 8.6 (L) 05/08/2018 0509   CALCIUM 9.6 10/31/2017 0903   GFRNONAA 10 (L) 05/08/2018 0509   GFRNONAA 31 (L) 09/02/2016 0838   GFRAA 12 (L) 05/08/2018 0509   GFRAA 36 (L) 09/02/2016 0838    COAGS: Lab Results  Component Value Date   INR 1.11 09/09/2017   INR 1.0 03/17/2016   INR 1.0 11/10/2015     Non-Invasive Vascular Imaging:   BUE vein mapping is pending  Statin:  No. Beta Blocker:  No. Aspirin:  No. ACEI:  No. ARB:  No. CCB use:  Yes Other antiplatelets/anticoagulants:  No.    ASSESSMENT/PLAN: This is a 73 y.o. female with CKD 5 in need of permanent dialysis access   -pt is right hand dominant; will plan for left arm access and will determine when her vein mapping is complete.  Discussed steal syndrome with her and her son. -Dr. Donnetta Hutching will be by to see the pt later today.   Leontine Locket, PA-C [facility] [phone]  I have examined the patient, reviewed and agree with above.  Vein map pending.  Very small surface veins by physical exam.  Discussed with the patient and her family present options of catheter, fistula and graft.  We will make further recommendations pending  vein map  Curt Jews, MD 05/08/2018 7:38 PM

## 2018-05-09 ENCOUNTER — Telehealth: Payer: Self-pay | Admitting: Hematology

## 2018-05-09 ENCOUNTER — Inpatient Hospital Stay (HOSPITAL_COMMUNITY): Payer: Medicare Other

## 2018-05-09 DIAGNOSIS — Z01810 Encounter for preprocedural cardiovascular examination: Secondary | ICD-10-CM

## 2018-05-09 DIAGNOSIS — N184 Chronic kidney disease, stage 4 (severe): Secondary | ICD-10-CM

## 2018-05-09 DIAGNOSIS — N179 Acute kidney failure, unspecified: Secondary | ICD-10-CM

## 2018-05-09 DIAGNOSIS — N183 Chronic kidney disease, stage 3 (moderate): Secondary | ICD-10-CM

## 2018-05-09 DIAGNOSIS — I739 Peripheral vascular disease, unspecified: Secondary | ICD-10-CM

## 2018-05-09 DIAGNOSIS — D696 Thrombocytopenia, unspecified: Secondary | ICD-10-CM

## 2018-05-09 LAB — RENAL FUNCTION PANEL
ALBUMIN: 2.9 g/dL — AB (ref 3.5–5.0)
Anion gap: 10 (ref 5–15)
BUN: 41 mg/dL — ABNORMAL HIGH (ref 6–20)
CALCIUM: 8.9 mg/dL (ref 8.9–10.3)
CO2: 24 mmol/L (ref 22–32)
Chloride: 92 mmol/L — ABNORMAL LOW (ref 101–111)
Creatinine, Ser: 3.73 mg/dL — ABNORMAL HIGH (ref 0.44–1.00)
GFR calc Af Amer: 13 mL/min — ABNORMAL LOW (ref >60)
GFR calc non Af Amer: 11 mL/min — ABNORMAL LOW (ref >60)
GLUCOSE: 90 mg/dL (ref 65–99)
PHOSPHORUS: 4.5 mg/dL (ref 2.5–4.6)
Potassium: 3.8 mmol/L (ref 3.5–5.1)
SODIUM: 126 mmol/L — AB (ref 135–145)

## 2018-05-09 LAB — CBC
HCT: 27.6 % — ABNORMAL LOW (ref 36.0–46.0)
Hemoglobin: 9.4 g/dL — ABNORMAL LOW (ref 12.0–15.0)
MCH: 28.4 pg (ref 26.0–34.0)
MCHC: 34.1 g/dL (ref 30.0–36.0)
MCV: 83.4 fL (ref 78.0–100.0)
PLATELETS: 92 10*3/uL — AB (ref 150–400)
RBC: 3.31 MIL/uL — ABNORMAL LOW (ref 3.87–5.11)
RDW: 13.4 % (ref 11.5–15.5)
WBC: 2.9 10*3/uL — ABNORMAL LOW (ref 4.0–10.5)

## 2018-05-09 LAB — TYPE AND SCREEN
ABO/RH(D): O POS
ANTIBODY SCREEN: NEGATIVE
Unit division: 0

## 2018-05-09 LAB — BPAM RBC
BLOOD PRODUCT EXPIRATION DATE: 201906172359
ISSUE DATE / TIME: 201905221602
Unit Type and Rh: 5100

## 2018-05-09 MED ORDER — NEPRO/CARBSTEADY PO LIQD: 237.0000 mL | Freq: Two times a day (BID) | ORAL | 0 refills | Status: DC

## 2018-05-09 NOTE — Progress Notes (Addendum)
Bilateral Upper Extremity Vein Map  Note: measurements are now in "cm"   Sarah Harmon, BS, RDMS, RVT

## 2018-05-09 NOTE — Progress Notes (Signed)
NURSING PROGRESS NOTE  Sarah Harmon [id number] Discharge Data: 05/09/2018 4:54 PM Attending Provider: Jonetta Osgood, MD EPP:IRJJ, Hunt Oris, MD     Greig Castilla to be D/C'd Home per MD order.  Discussed with the patient the After Visit Summary and all questions fully answered. All IV's discontinued with no bleeding noted. All belongings returned to patient for patient to take home.   Last Vital Signs:  Blood pressure (!) 141/61, pulse (!) 52, temperature (!) 97.5 F (36.4 C), temperature source Oral, resp. rate 16, height 5\' 3"  (1.6 m), weight 49.9 kg (110 lb), SpO2 97 %.  Discharge Medication List Allergies as of 05/09/2018   No Known Allergies     Medication List    TAKE these medications   ALPRAZolam 0.5 MG tablet Commonly known as:  XANAX TAKE 1 TABLET BY MOUTH ONCE DAILY AT BEDTIME   amLODipine 10 MG tablet Commonly known as:  NORVASC TAKE 1 TABLET BY MOUTH ONCE DAILY   citalopram 20 MG tablet Commonly known as:  CELEXA TAKE ONE-HALF TABLET BY MOUTH ONCE DAILY   cloNIDine 0.1 MG tablet Commonly known as:  CATAPRES Take 1 tablet (0.1 mg total) by mouth daily.   feeding supplement (NEPRO CARB STEADY) Liqd Take 237 mLs by mouth 2 (two) times daily between meals. What changed:  when to take this   folic acid 1 MG tablet Commonly known as:  FOLVITE TAKE 1 TABLET BY MOUTH ONCE DAILY   furosemide 40 MG tablet Commonly known as:  LASIX Take 1 tablet (40 mg total) by mouth daily.   hydrALAZINE 100 MG tablet Commonly known as:  APRESOLINE TAKE 1 TABLET BY MOUTH THREE TIMES DAILY   isosorbide dinitrate 30 MG tablet Commonly known as:  ISORDIL Take 1 tablet (30 mg total) by mouth 3 (three) times daily.   lactulose 10 GM/15ML solution Commonly known as:  CHRONULAC Take 30 mLs (20 g total) by mouth daily.   propranolol 40 MG tablet Commonly known as:  INDERAL Take 1 tablet (40 mg total) by mouth 2 (two) times daily.   Vitamin D3 1000 units Caps Take  10,000 Units by mouth daily.

## 2018-05-09 NOTE — Consult Note (Signed)
[facility] CM Primary Care Navigator  05/09/2018  Sarah Harmon May 01, 1946 [id number]   Met withpatientat the bedsideto identify possible discharge needs. Patientreports having"abnormal lab results" and was sent by her primary nephrologist to the emergency room for evaluation of hyperkalemia and hyponatremia whichhad ledto this admission. (anemia, chronic kidney disease stage V)  Patientendorses Dr.James John with [employer] at [facility] [employer] care provider.   Patient Bear to obtain medications without difficulty.   Patient reportsmanagingherownmedications at [facility] use of "pill box" system filled weekly.  Patient statesthatdaughter (Sarah Harmon)has beenprovidingtransportation to herdoctors'appointments.  Patientmentionedthatshe livesalone and independent with self care but her daughter (lives closeby), who looks after and assistswithall her needs at home.  Anticipateddischarge planishomeaccording to patient.  Patientvoiced understanding to call primary care provider's officewhen shereturns homefor a post discharge follow-up visit within1- 2weeksor sooner if needs arise.Patient letter (with PCP's contact number) was provided asher reminder.   Discussed with patientregarding [facility] CM services available for health management at home butindicated having no needs or concerns for now.  Patienthad pleasantlydeclined[facility]- CM services offered at this time. She statesthat daughter has been helping with her health needs andissuesat  home and right now, she does not feel the need for services.   Patientexpressed understanding to seekreferral from primary care provider to [facility] care management if deemednecessaryand appropriate for any services in the future.  [facility] care management information was provided for future needs thatshe may have.  Patient however, had  only opted and verbally agreed forEMMI calls to follow-upwith herrecovery at home.  Referral made for [facility] calls after discharge.    For additional questions please contact:  Edwena Felty A. Teyah Rossy, BSN, RN-BC [facility] PRIMARY CARE Navigator Cell: [phone]

## 2018-05-09 NOTE — Discharge Summary (Addendum)
Physician Discharge Summary  Sarah Harmon JJH:[id number] DOB: May 09, 1946 DOA: 05/06/2018  PCP: Biagio Borg, MD  Admit date: 05/06/2018 Discharge date: 05/09/2018  Admitted From: Home Disposition:  Home  Recommendations for Outpatient Follow-up:  1. Follow up with PCP in 1-2 weeks 2. Follow up with Vascular surgery in one week  3. Follow up with Nephrology in one week  4. Please obtain BMP/CBC in one week 5. Follow vein mapping studies-pending at the time of discharge  Home Health: No Equipment/Devices: None  Discharge Condition: Stable  CODE STATUS: Full Diet recommendation: Renal / Carb Modified   Brief/Interim Summary: Patient is a25 y.o.femalewith prior history of chronic kidney disease stage IV-V, cirrhosis, history of hepatocellular cancer status post radioablation in 2016, chronic hyponatremia-sent to the ED for evaluation of hyperkalemia and hyponatremia.Hemoblobin drop on 5/22, no active source at this time she received one unit of packed red blood cell per Nephrology . See below for further details  Discharge Diagnoses:  Hyperkalemia:Secondary to possible Aldactone use (per nephrology) and use of supplemental use of ensure shakes at home in the background of chronic kidney disease stage IV-V. Resolved with Kayexalate/IV sodium bicarbonate. Avoid Aldactone and ensure shakes upon discharge encourage use of Nepro shakes instead.   Hyponatremia:Appears to have chronic hyponatremia-improved with IV fluids-suspected to have hypovolemic hypotonic hyponatremia. Does have cirrhosis at baseline as well. No longer on IV fluids, remains on Lasix.   Chronic kidney disease stage IV-V: Nephrology consulted [facility] for future access procedure, vein mapping completed while inpatient on 5/23. Currently no acute indications for HD.Patient will follow up with Dr Donnetta Hutching in the outpatient setting for placement of HD access  Hypertension:Controlled-continue with hydralazine, isordil,  clonidine and amlodipine.  Hepatocellular carcinoma/alcoholic liver cirrhosis: Continue outpatient follow-up with Dr. Richardo Priest CT-guided microwave ablation in 2016.  Anemia: Likely secondary to chronic disease, new 2 gram drop in HGB on 5/22, no active source of bleeding. Per Nephrology one units PRBC and dose of Feraheme 5/23.    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-followfor now  Discharge Instructions  Discharge Instructions    Call MD for:  difficulty breathing, headache or visual disturbances   Complete by:  As directed    Call MD for:  persistant nausea and vomiting   Complete by:  As directed    Call MD for:  temperature >100.4   Complete by:  As directed    Diet - low sodium heart healthy   Complete by:  As directed    Discharge instructions   Complete by:  As directed    Do not take aldactone and do not take Ensure   Increase activity slowly   Complete by:  As directed      Allergies as of 05/09/2018   No Known Allergies     Medication List    TAKE these medications   ALPRAZolam 0.5 MG tablet Commonly known as:  XANAX TAKE 1 TABLET BY MOUTH ONCE DAILY AT BEDTIME   amLODipine 10 MG tablet Commonly known as:  NORVASC TAKE 1 TABLET BY MOUTH ONCE DAILY   citalopram 20 MG tablet Commonly known as:  CELEXA TAKE ONE-HALF TABLET BY MOUTH ONCE DAILY   cloNIDine 0.1 MG tablet Commonly known as:  CATAPRES Take 1 tablet (0.1 mg total) by mouth daily.   feeding supplement (NEPRO CARB STEADY) Liqd Take 237 mLs by mouth 2 (two) times daily between meals. What changed:  when to take this   folic acid 1 MG tablet  Commonly known as:  FOLVITE TAKE 1 TABLET BY MOUTH ONCE DAILY   furosemide 40 MG tablet Commonly known as:  LASIX Take 1 tablet (40 mg total) by mouth daily.   hydrALAZINE 100 MG tablet Commonly known as:  APRESOLINE TAKE 1 TABLET BY MOUTH THREE TIMES DAILY   isosorbide dinitrate 30 MG  tablet Commonly known as:  ISORDIL Take 1 tablet (30 mg total) by mouth 3 (three) times daily.   lactulose 10 GM/15ML solution Commonly known as:  CHRONULAC Take 30 mLs (20 g total) by mouth daily.   propranolol 40 MG tablet Commonly known as:  INDERAL Take 1 tablet (40 mg total) by mouth 2 (two) times daily.   Vitamin D3 1000 units Caps Take 10,000 Units by mouth daily.      Follow-up Information    Biagio Borg, MD. Schedule an appointment as soon as possible for a visit in 1 week(s).   Specialties:  Internal Medicine, Radiology Contact information: [place] [address] [phone]        Corliss Parish, MD. Schedule an appointment as soon as possible for a visit in 1 week(s).   Specialty:  Nephrology Contact information: [place] [address] [phone]        Rosetta Posner, MD. Schedule an appointment as soon as possible for a visit in 1 week(s).   Specialties:  Vascular Surgery, Cardiology Contact information: [address] [place] [address] [phone]          No Known Allergies  Consultations:  Nephrology   Procedures/Studies: None    Subjective: Patient resting upon entering room, sates she continues to feel better and has had no nausea or vomiting. She is very eager for discharge today.    Discharge Exam: Vitals:   05/09/18 0814 05/09/18 0817  BP: (!) 148/62 (!) 148/62  Pulse:  (!) 56  Resp:    Temp:    SpO2:     Vitals:   05/08/18 2100 05/09/18 0500 05/09/18 0814 05/09/18 0817  BP: (!) 130/58 (!) 145/61 (!) 148/62 (!) 148/62  Pulse: (!) 58 (!) 54  (!) 56  Resp: 18 18    Temp: 98.8 F (37.1 C) 98.6 F (37 C)    TempSrc: Oral Oral    SpO2: 99% 96%    Weight:      Height:        General: Pt is alert, awake, not in acute distress Cardiovascular: RRR, S1/S2 +, no rubs, no gallops Respiratory: CTA bilaterally, no wheezing, no rhonchi Abdominal: Soft, NT, ND, bowel sounds  + Extremities: no edema, no cyanosis    The results of significant diagnostics from this hospitalization (including imaging, microbiology, ancillary and laboratory) are listed below for reference.     Microbiology: No results found for this or any previous visit (from the past 240 hour(s)).   Labs: BNP (last 3 results) No results for input(s): BNP in the last 8760 hours. Basic Metabolic Panel: Recent Labs  Lab 05/06/18 1923 05/07/18 0309 05/07/18 0852 05/08/18 0509 05/09/18 0521  NA 123* 127* 125* 127* 126*  K 6.7* 4.8 4.1 4.0 3.8  CL 93* 95* 93* 93* 92*  CO2 21* 19* 23 25 24   GLUCOSE 118* 98 128* 86 90  BUN 42* 39* 39* 45* 41*  CREATININE 3.67* 3.61* 3.56* 3.92* 3.73*  CALCIUM 9.5 9.5 9.1 8.6* 8.9  PHOS  --   --   --  4.5 4.5   Liver  Function Tests: Recent Labs  Lab 05/07/18 0309 05/08/18 0509 05/09/18 0521  AST 24  --   --   ALT 13*  --   --   ALKPHOS 38  --   --   BILITOT 0.8  --   --   PROT 7.4  --   --   ALBUMIN 3.5 2.7* 2.9*   No results for input(s): LIPASE, AMYLASE in the last 168 hours. No results for input(s): AMMONIA in the last 168 hours. CBC: Recent Labs  Lab 05/06/18 1923 05/07/18 0309 05/08/18 0509 05/08/18 0855 05/09/18 0521  WBC 3.6* 4.8 2.3* 2.2* 2.9*  HGB 9.3* 9.1* 7.4* 7.5* 9.4*  HCT 28.3* 27.1* 21.5* 21.8* 27.6*  MCV 87.6 86.0 86.3 85.8 83.4  PLT 145* 134* 111* 95* 92*   Cardiac Enzymes: No results for input(s): CKTOTAL, CKMB, CKMBINDEX, TROPONINI in the last 168 hours. BNP: Invalid input(s): POCBNP CBG: Recent Labs  Lab 05/07/18 1106  GLUCAP 128*   D-Dimer No results for input(s): DDIMER in the last 72 hours. Hgb A1c No results for input(s): HGBA1C in the last 72 hours. Lipid Profile No results for input(s): CHOL, HDL, LDLCALC, TRIG, CHOLHDL, LDLDIRECT in the last 72 hours. Thyroid function studies No results for input(s): TSH, T4TOTAL, T3FREE, THYROIDAB in the last 72 hours.  Invalid input(s): FREET3 Anemia work  up Recent Labs    05/07/18 0852  TIBC 295  IRON 33   Urinalysis    Component Value Date/Time   COLORURINE YELLOW 09/10/2017 0605   APPEARANCEUR HAZY (A) 09/10/2017 0605   LABSPEC 1.014 09/10/2017 0605   PHURINE 6.0 09/10/2017 0605   GLUCOSEU NEGATIVE 09/10/2017 0605   GLUCOSEU NEGATIVE 09/04/2017 0943   HGBUR NEGATIVE 09/10/2017 0605   BILIRUBINUR NEGATIVE 09/10/2017 0605   KETONESUR NEGATIVE 09/10/2017 0605   PROTEINUR >=300 (A) 09/10/2017 0605   UROBILINOGEN 0.2 09/04/2017 0943   NITRITE NEGATIVE 09/10/2017 0605   LEUKOCYTESUR NEGATIVE 09/10/2017 0605   Sepsis Labs Invalid input(s): PROCALCITONIN,  WBC,  LACTICIDVEN Microbiology No results found for this or any previous visit (from the past 240 hour(s)).   Time coordinating discharge: Over 30 minutes  SIGNED:  Johnsie Cancel, NP [facility]   If 7PM-7AM, please contact night-coverage [email or web address] Password [email or web address] 05/09/2018, 10:22 AM   Attending MD note  Patient was seen, examined,treatment plan was discussed with the NP.  I have personally reviewed the clinical findings, lab, imaging studies and management of this patient in detail. I agree with the documentation, as recorded by the NP.   Subjective: No major complaints overnight.  No chest pain or shortness of breath.  Telemetry (Personally reviewed): Sinus rhythm On Exam: Vitals:   05/08/18 2100 05/09/18 0500 05/09/18 0814 05/09/18 0817  BP: (!) 130/58 (!) 145/61 (!) 148/62 (!) 148/62  Pulse: (!) 58 (!) 54  (!) 56  Resp: 18 18    Temp: 98.8 F (37.1 C) 98.6 F (37 C)    TempSrc: Oral Oral    SpO2: 99% 96%    Weight:      Height:       Gen. exam: Awake, alert, not in any distress Chest: Good air entry bilaterally, no rhonchi or rales CVS: S1-S2 regular, no murmurs Abdomen: Soft, nontender and nondistended Neurology: Non-focal Skin: No rash or lesions   Lab Data: CBC: Recent Labs  Lab 05/06/18 1923 05/07/18 0309 05/08/18 0509  05/08/18 0855 05/09/18 0521  WBC 3.6* 4.8 2.3* 2.2* 2.9*  HGB 9.3* 9.1* 7.4* 7.5* 9.4*  HCT 28.3* 27.1* 21.5* 21.8* 27.6*  MCV 87.6 86.0 86.3 85.8 83.4  PLT 145* 134* 111* 95* 92*    Basic Metabolic Panel: Recent Labs  Lab 05/06/18 1923 05/07/18 0309 05/07/18 0852 05/08/18 0509 05/09/18 0521  NA 123* 127* 125* 127* 126*  K 6.7* 4.8 4.1 4.0 3.8  CL 93* 95* 93* 93* 92*  CO2 21* 19* 23 25 24   GLUCOSE 118* 98 128* 86 90  BUN 42* 39* 39* 45* 41*  CREATININE 3.67* 3.61* 3.56* 3.92* 3.73*  CALCIUM 9.5 9.5 9.1 8.6* 8.9  PHOS  --   --   --  4.5 4.5    Assessment with plan: Hyperkalemia: Secondary to Aldactone use and Ensure shakes.  Resolved.  Encourage patient to avoid Aldactone and to not use Ensure shakes on discharge.  Hyponatremia: Chronic-slightly worse than her usual baseline-felt to be hypotonic hyponatremia-responded well to IV fluids.  Has history of cirrhosis of the liver-and remains on Lasix.  Chronic kidney disease stage IV-V: Nephrology followed closely during this hospital stay-[facility] will arrange for outpatient HD access placement.  No indication to start HD at this point.  Hepatocellular carcinoma/alcoholic liver cirrhosis: Stable-follows with Dr. Annamaria Boots.  Anemia: Likely has anemia secondary to chronic kidney disease-drop in hemoglobin on admission secondary to IV fluids.  No evidence of blood loss.  Received 1 unit of PRBC on 5/22, and 1 dose of IV iron.  Follow CBC closely in the outpatient setting  Thrombocytopenia: Appears to be mild-probably secondary to hypersplenism in a setting of cirrhosis  Disposition: Stable for discharge home today  Total time spent 35 minutes  Rest as above  [facility]

## 2018-05-09 NOTE — Telephone Encounter (Signed)
Patient daughter called in to reschedule her mothers appointment due to her bing IP at present 5/23

## 2018-05-09 NOTE — Progress Notes (Signed)
[facility] Rounding Note  Background: 49F with late stage CKD5 (presumed HTN, nearing dialysis need), cirrhosis (combination alcoholic and autoimmune etiology; no clear history of portal hypertension, ascites ), h/o [place] s/p radioablation 04/2015; follows with [facility]; MRI 10/2017 w/o recurrence), mild hyponatremia Na 130s usually. Presented to [facility] 5/20 with weight loss, K 7.1, Na 122, creatinine 3.78 and sent to ED. Labs here a little better, looked dry. Evaluated by Dr. Joelyn Oms. Med Rx for ^K and 500 cc NS bolus given.  Aldactone stopped.  Assessment/Recommendations  1. Hyperkalemia - resolved with med Rx. Driven by low GFR, diet, aldactone (stopped). Avoid Ensure (K content), use Nepro for supplement.  2. Hyponatremia, acute on mild chronic- hypovolemic hypotonic hyponatremia. Improved since admission. Had 500 cc NS bolus in the ED. I see lasix on med list - I stopped (she is dry if anything) 3. Anemia - Rather large Hb drop past 24 hours to 7's confirmed on repeat. TSat low at 11 Feraheme ordered. Rec'd (only) 60 of Aranesp  (based on Hb then of 9.1). Given age and frailty, transfused 1 unit. 4. Progressive CKD5. Really since hosp has had minimal if any uremic sx and feels better post transfusion and correction of K issues. Getting V Mapping and Dr. Donnetta Hutching evaluating her permanent access options. Does not need to start HD just yet (though is close numbers wise) 5. Cirrhosis, autoimmune +/- alcohol; hx/o [place] s/p ablation follows with [facility] Barban; no recurrence 10/2017 MRI 6. Bradycardia, on BB, AFib with SVR. ? If elevated K was contributing. HR still low side 7. HTN - controlled. primary added hydralazine and amlodipine, looks quite good at this time. Do not restart aldactone.   Disposition: Pt has been transfused 1 unit with improved Hb to 9.4, rec'd a dose of Aranesp 60 and a dose of Feraheme. Getting vmap today and Dr. Donnetta Hutching to propose the access options for her and schedule for outpt procedure. I am fine  with discharge to home with outpt access plan, have arranged f/u with Dr. Moshe Cipro for Friday 05/31/18 at [place].  Will decide at that time on further Aranesp/Fe and evaluate for HD indications. Family should call our office if pt develops any swelling, SOB, nausea. Make sure she knows NOT to resume aldactone at discharge.   Jamal Maes, MD [facility] [phone] Pager 05/09/2018, 11:33 AM  Subjective/Interval History:   Says she feels "so much better" post transfusion yesterday No nausea, appetite is good, eating fine.  Got dose of Feraheme yesterday in addition to blood. Headed down now for V mapping.   Objective Vital signs in last 24 hours: Vitals:   05/08/18 2100 05/09/18 0500 05/09/18 0814 05/09/18 0817  BP: (!) 130/58 (!) 145/61 (!) 148/62 (!) 148/62  Pulse: (!) 58 (!) 54  (!) 56  Resp: 18 18    Temp: 98.8 F (37.1 C) 98.6 F (37 C)    TempSrc: Oral Oral    SpO2: 99% 96%    Weight:      Height:       Weight change:   Intake/Output Summary (Last 24 hours) at 05/09/2018 1133 Last data filed at 05/08/2018 1920 Gross per 24 hour  Intake 432 ml  Output -  Net 432 ml   Physical Exam:  Blood pressure (!) 148/62, pulse (!) 56, temperature 98.6 F (37 C), temperature source Oral, resp. rate 18, height 5\' 3"  (1.6 m), weight 49.9 kg (110 lb), SpO2 96 %.   Very nice thin AAF, delightful, more  animated NAD VS as noted Lungs remain clear Loletha Grayer, regular, S1S2 No S3 Abd soft and not tender No edema No asterixis Oriented, awake and alert   Recent Labs  Lab 05/06/18 1923 05/07/18 0309 05/07/18 0852 05/08/18 0509 05/09/18 0521  NA 123* 127* 125* 127* 126*  K 6.7* 4.8 4.1 4.0 3.8  CL 93* 95* 93* 93* 92*  CO2 21* 19* 23 25 24   GLUCOSE 118* 98 128* 86 90  BUN 42* 39* 39* 45* 41*  CREATININE 3.67* 3.61* 3.56* 3.92* 3.73*  CALCIUM 9.5 9.5 9.1 8.6* 8.9  PHOS  --   --   --  4.5 4.5    Recent Labs  Lab 05/07/18 0309 05/08/18 0509 05/09/18 0521  AST  24  --   --   ALT 13*  --   --   ALKPHOS 38  --   --   BILITOT 0.8  --   --   PROT 7.4  --   --   ALBUMIN 3.5 2.7* 2.9*    Recent Labs  Lab 05/07/18 0309 05/08/18 0509 05/08/18 0855 05/09/18 0521  WBC 4.8 2.3* 2.2* 2.9*  HGB 9.1* 7.4* 7.5* 9.4*  HCT 27.1* 21.5* 21.8* 27.6*  MCV 86.0 86.3 85.8 83.4  PLT 134* 111* 95* 92*   Results for LUNA, AUDIA (MRN [id number]) as of 05/08/2018 12:08  05/07/2018 08:52  Iron 33  UIBC 262  TIBC 295  Saturation Ratios 11    Medications:  . ALPRAZolam  0.5 mg Oral QHS  . amLODipine  10 mg Oral Daily  . cholecalciferol  1,000 Units Oral Daily  . citalopram  10 mg Oral Daily  . cloNIDine  0.1 mg Oral Daily  . darbepoetin (ARANESP) injection - NON-DIALYSIS  60 mcg Subcutaneous Q Tue-1800  . feeding supplement (NEPRO CARB STEADY)  237 mL Oral BID BM  . folic acid  1 mg Oral Daily  . hydrALAZINE  100 mg Oral TID  . isosorbide dinitrate  30 mg Oral TID  . lactulose  20 g Oral Daily  . propranolol  40 mg Oral BID

## 2018-05-10 ENCOUNTER — Telehealth: Payer: Self-pay | Admitting: *Deleted

## 2018-05-10 ENCOUNTER — Other Ambulatory Visit: Payer: Self-pay | Admitting: Hematology

## 2018-05-10 DIAGNOSIS — C220 Liver cell carcinoma: Secondary | ICD-10-CM

## 2018-05-10 NOTE — Telephone Encounter (Signed)
Transition Care Management Follow-up Telephone Call   Date discharged? 05/09/18   How have you been since you were released from the hospital? Spoke w/pt daughter Sarah Harmon) she states mom is doing ok   Do you understand why you were in the hospital? YES   Do you understand the discharge instructions? YES   Where were you discharged to? Home   Items Reviewed:  Medications reviewed: YES  Allergies reviewed: YES  Dietary changes reviewed: YES, renal and carb diet  Referrals reviewed: YES, daughter states she have appt w/dialysis 5/29.    Functional Questionnaire:   Activities of Daily Living (ADLs):   She states she are independent in the following: bathing and hygiene, feeding, continence, grooming, toileting and dressing States they require assistance with the following: ambulation   Any transportation issues/concerns?: NO   Any patient concerns? NO   Confirmed importance and date/time of follow-up visits scheduled YES, appt 05/17/18  Provider Appointment booked with Dr. Jenny Reichmann   Confirmed with patient if condition begins to worsen call PCP or go to the ER.  Patient was given the office number and encouraged to call back with question or concerns.  : YES

## 2018-05-14 ENCOUNTER — Other Ambulatory Visit: Payer: Medicare Other

## 2018-05-14 ENCOUNTER — Ambulatory Visit: Payer: Medicare Other | Admitting: Hematology

## 2018-05-16 ENCOUNTER — Encounter: Payer: Self-pay | Admitting: Vascular Surgery

## 2018-05-16 ENCOUNTER — Telehealth: Payer: Self-pay

## 2018-05-16 ENCOUNTER — Encounter: Payer: Self-pay | Admitting: *Deleted

## 2018-05-16 ENCOUNTER — Other Ambulatory Visit: Payer: Self-pay | Admitting: *Deleted

## 2018-05-16 ENCOUNTER — Ambulatory Visit (INDEPENDENT_AMBULATORY_CARE_PROVIDER_SITE_OTHER): Payer: Medicare Other | Admitting: Vascular Surgery

## 2018-05-16 ENCOUNTER — Other Ambulatory Visit: Payer: Self-pay

## 2018-05-16 VITALS — BP 177/76 | HR 40 | Resp 18 | Ht 63.0 in | Wt 113.0 lb

## 2018-05-16 DIAGNOSIS — N185 Chronic kidney disease, stage 5: Secondary | ICD-10-CM

## 2018-05-16 NOTE — H&P (View-Only) (Signed)
Patient name: Sarah Harmon MRN: [id number] DOB: [birth year] Sex: female  REASON FOR CONSULT: Dialysis access  HPI: Sarah Harmon is a 72 y.o. female was seen by my partner Dr. Donnetta Hutching as an in-hospital consult a few weeks ago.  He recommended placement of a left arm AV graft versus fistula.  The patient was then discharged to home because her kidney function was stable.  She now presents for placement of a long-term hemodialysis access.  She is right-handed.  CKD 5.  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  She has a history of pancytopenia including thrombocytopenia.  Other medical problems include hyperlipidemia, hypertension, hyponatremia all of which have been stable.  Past Medical History:  Diagnosis Date  . ALCOHOL ABUSE 07/13/2010  . ANXIETY 12/22/2009  . DEPRESSION 12/22/2009  . 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 DJD (degenerative joint disease), cervical    patient denies on preop of 05/10/15   . FATIGUE 12/23/2010  . GALLSTONES 12/22/2009  . Gallstones   . Headache    occasional   . HYPERLIPIDEMIA 12/22/2009  . HYPERTENSION 12/22/2009  . HYPONATREMIA 12/22/2009  . 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 OSTEOPENIA 12/22/2009  . Pancytopenia 07/13/2010  . RENAL CYST, LEFT 12/22/2009  . VITAMIN D DEFICIENCY 12/23/2010  . WEIGHT LOSS 12/23/2010   Past Surgical History:  Procedure Laterality Date  . ABDOMINAL HYSTERECTOMY    . IR GENERIC HISTORICAL  10/18/2016   IR RADIOLOGIST EVAL & MGMT 10/18/2016 Sarah Edouard, MD [facility] INTERV RAD  . IR RADIOLOGIST EVAL & MGMT  05/30/2017    Family History  Problem Relation Age of Onset  . Lung cancer Brother   . Breast cancer Other   . Breast cancer Sister   . Gastric cancer Sister   . Lung cancer Sister   . Colon cancer Neg Hx   . Colon polyps Neg Hx   . Rectal cancer Neg Hx   . Stomach cancer Neg Hx   . Esophageal cancer Neg Hx     SOCIAL HISTORY: Social History   Socioeconomic History  . Marital status:  Widowed    Spouse name: Not on file  . Number of children: 4  . Years of education: Not on file  . Highest education level: Not on file  Occupational History  . Occupation: retired  Scientific laboratory technician  . Financial resource strain: Not on file  . Food insecurity:    Worry: Not on file    Inability: Not on file  . Transportation needs:    Medical: Not on file    Non-medical: Not on file  Tobacco Use  . Smoking status: Never Smoker  . Smokeless tobacco: Never Used  Substance and Sexual Activity  . Alcohol use: Yes    Alcohol/week: 1.8 - 2.4 oz    Types: 3 - 4 Cans of beer per week    Comment: she quit 02/2015   . Drug use: No  . Sexual activity: Not on file  Lifestyle  . Physical activity:    Days per week: Not on file    Minutes per session: Not on file  . Stress: Not on file  Relationships  . Social connections:    Talks on phone: Not on file    Gets together: Not on file    Attends religious service: Not on file    Active member of club or organization: Not on file    Attends meetings of clubs or organizations: Not  on file    Relationship status: Not on file  . Intimate partner violence:    Fear of current or ex partner: Not on file    Emotionally abused: Not on file    Physically abused: Not on file    Forced sexual activity: Not on file  Other Topics Concern  . 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 ALPRAZolam (XANAX) 0.5 MG tablet TAKE 1 TABLET BY MOUTH ONCE DAILY AT BEDTIME 30 tablet 2  . amLODipine (NORVASC) 10 MG tablet TAKE 1 TABLET BY MOUTH ONCE DAILY 90 tablet 2  . Cholecalciferol (VITAMIN D3) 1000 UNITS CAPS Take 10,000 Units by mouth daily.     . citalopram (CELEXA) 20 MG tablet TAKE ONE-HALF TABLET BY MOUTH ONCE DAILY 45  tablet 3  . cloNIDine (CATAPRES) 0.1 MG tablet Take 1 tablet (0.1 mg total) by mouth daily. 60 tablet 1  . folic acid (FOLVITE) 1 MG tablet TAKE 1 TABLET BY MOUTH ONCE DAILY 90 tablet 0  . furosemide (LASIX) 40 MG tablet Take 1 tablet (40 mg total) by mouth daily. 30 tablet 0  . hydrALAZINE (APRESOLINE) 100 MG tablet TAKE 1 TABLET BY MOUTH THREE TIMES DAILY 270 tablet 0  . isosorbide dinitrate (ISORDIL) 30 MG tablet Take 1 tablet (30 mg total) by mouth 3 (three) times daily. 90 tablet 0  . lactulose (CHRONULAC) 10 GM/15ML solution Take 30 mLs (20 g total) by mouth daily. 473 mL 3  . Nutritional Supplements (FEEDING SUPPLEMENT, NEPRO CARB STEADY,) LIQD Take 237 mLs by mouth 2 (two) times daily between meals. 60 Can 0  . propranolol (INDERAL) 40 MG tablet Take 1 tablet (40 mg total) by mouth 2 (two) times daily. 180 tablet 3   No current facility-administered medications for this visit.     ROS:   General:  No weight loss, Fever, chills  HEENT: No recent headaches, no nasal bleeding, no visual changes, no sore throat  Neurologic: No dizziness, blackouts, seizures. No recent symptoms of stroke or mini- stroke. No recent episodes of slurred speech, or temporary blindness.  Cardiac: No recent episodes of chest pain/pressure, no shortness of breath at rest.  No shortness of breath with exertion.  Denies history of atrial fibrillation or irregular heartbeat  Vascular: No history of rest pain in feet.  No history of claudication.  No history of non-healing ulcer, No history of DVT   Pulmonary: No home oxygen, no productive cough, no hemoptysis,  No asthma or wheezing  Musculoskeletal:  [ ]  Arthritis, [ ]  Low back pain,  [ ]  Joint pain  Hematologic:No history of hypercoagulable state.  No history of easy bleeding.  + history of anemia  Gastrointestinal: No hematochezia or melena,  No gastroesophageal reflux, no trouble swallowing  Urinary: [X]  chronic Kidney disease, [ ]  on HD - [ ]  MWF or [  ] TTHS, [ ]  Burning with urination, [ ]  Frequent urination, [ ]  Difficulty urinating;   Skin: No rashes  Psychological: No history of anxiety,  No history of depression   Physical Examination  Vitals:   05/16/18 1009  BP: (!) 177/76  Pulse: (!) 40  Resp: 18  SpO2: 96%  Weight: 113 lb (51.3 kg)  Height: 5'  3" (1.6 m)    Body mass index is 20.02 kg/m.  General:  Alert and oriented, no acute distress HEENT: Normal Neck: No bruit or JVD Pulmonary: Clear to auscultation bilaterally Cardiac: Regular Rate and Rhythm without murmur Abdomen: Soft, non-tender, non-distended, no mass Skin: No rash Extremity Pulses:  2+ radial, brachial pulses bilaterally Musculoskeletal: No deformity or edema  Neurologic: Upper and lower extremity motor 5/5 and symmetric  DATA:  Viewed the patient's vein mapping which showed a small basilic and cephalic vein bilaterally.  ASSESSMENT: Patient needs long-term hemodialysis.  She does not really appear to have adequate vein for creation of the fistula.   PLAN: Patient is scheduled to have a left upper arm graft placed May 21, 2018.  Risk benefits possible complications and procedure details were discussed with patient and her daughter today including but not limited to bleeding infection ischemic steal graft thrombosis.  They understand and agreed to proceed.  Of note the patient's platelet count has usually been in the 90-100 range.  If it remains in this range it will be acceptable for placement of her graft.  I did discuss with the patient and her daughter that she would be at some increased risk of bleeding.  This would probably manifest as either ecchymosis in the skin or possible hematoma.  They understand this as well.   Ruta Hinds, MD [facility] [place] Office: [phone] Pager: [phone]

## 2018-05-16 NOTE — H&P (View-Only) (Signed)
Patient name: Sarah Harmon MRN: [id number] DOB: [birth year] Sex: female  REASON FOR CONSULT: Dialysis access  HPI: Sarah Harmon is a 72 y.o. female was seen by my partner Dr. Donnetta Hutching as an in-hospital consult a few weeks ago.  He recommended placement of a left arm AV graft versus fistula.  The patient was then discharged to home because her kidney function was stable.  She now presents for placement of a long-term hemodialysis access.  She is right-handed.  CKD 5.  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  She has a history of pancytopenia including thrombocytopenia.  Other medical problems include hyperlipidemia, hypertension, hyponatremia all of which have been stable.  Past Medical History:  Diagnosis Date  . ALCOHOL ABUSE 07/13/2010  . ANXIETY 12/22/2009  . DEPRESSION 12/22/2009  . 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 DJD (degenerative joint disease), cervical    patient denies on preop of 05/10/15   . FATIGUE 12/23/2010  . GALLSTONES 12/22/2009  . Gallstones   . Headache    occasional   . HYPERLIPIDEMIA 12/22/2009  . HYPERTENSION 12/22/2009  . HYPONATREMIA 12/22/2009  . 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 OSTEOPENIA 12/22/2009  . Pancytopenia 07/13/2010  . RENAL CYST, LEFT 12/22/2009  . VITAMIN D DEFICIENCY 12/23/2010  . WEIGHT LOSS 12/23/2010   Past Surgical History:  Procedure Laterality Date  . ABDOMINAL HYSTERECTOMY    . IR GENERIC HISTORICAL  10/18/2016   IR RADIOLOGIST EVAL & MGMT 10/18/2016 [name], MD [facility] INTERV RAD  . IR RADIOLOGIST EVAL & MGMT  05/30/2017    Family History  Problem Relation Age of Onset  . Lung cancer Brother   . Breast cancer Other   . Breast cancer Sister   . Gastric cancer Sister   . Lung cancer Sister   . Colon cancer Neg Hx   . Colon polyps Neg Hx   . Rectal cancer Neg Hx   . Stomach cancer Neg Hx   . Esophageal cancer Neg Hx     SOCIAL HISTORY: Social History   Socioeconomic History  . Marital status:  Widowed    Spouse name: Not on file  . Number of children: 4  . Years of education: Not on file  . Highest education level: Not on file  Occupational History  . Occupation: retired  Scientific laboratory technician  . Financial resource strain: Not on file  . Food insecurity:    Worry: Not on file    Inability: Not on file  . Transportation needs:    Medical: Not on file    Non-medical: Not on file  Tobacco Use  . Smoking status: Never Smoker  . Smokeless tobacco: Never Used  Substance and Sexual Activity  . Alcohol use: Yes    Alcohol/week: 1.8 - 2.4 oz    Types: 3 - 4 Cans of beer per week    Comment: she quit 02/2015   . Drug use: No  . Sexual activity: Not on file  Lifestyle  . Physical activity:    Days per week: Not on file    Minutes per session: Not on file  . Stress: Not on file  Relationships  . Social connections:    Talks on phone: Not on file    Gets together: Not on file    Attends religious service: Not on file    Active member of club or organization: Not on file    Attends meetings of clubs or organizations: Not  on file    Relationship status: Not on file  . Intimate partner violence:    Fear of current or ex partner: Not on file    Emotionally abused: Not on file    Physically abused: Not on file    Forced sexual activity: Not on file  Other Topics Concern  . 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 ALPRAZolam (XANAX) 0.5 MG tablet TAKE 1 TABLET BY MOUTH ONCE DAILY AT BEDTIME 30 tablet 2  . amLODipine (NORVASC) 10 MG tablet TAKE 1 TABLET BY MOUTH ONCE DAILY 90 tablet 2  . Cholecalciferol (VITAMIN D3) 1000 UNITS CAPS Take 10,000 Units by mouth daily.     . citalopram (CELEXA) 20 MG tablet TAKE ONE-HALF TABLET BY MOUTH ONCE DAILY 45  tablet 3  . cloNIDine (CATAPRES) 0.1 MG tablet Take 1 tablet (0.1 mg total) by mouth daily. 60 tablet 1  . folic acid (FOLVITE) 1 MG tablet TAKE 1 TABLET BY MOUTH ONCE DAILY 90 tablet 0  . furosemide (LASIX) 40 MG tablet Take 1 tablet (40 mg total) by mouth daily. 30 tablet 0  . hydrALAZINE (APRESOLINE) 100 MG tablet TAKE 1 TABLET BY MOUTH THREE TIMES DAILY 270 tablet 0  . isosorbide dinitrate (ISORDIL) 30 MG tablet Take 1 tablet (30 mg total) by mouth 3 (three) times daily. 90 tablet 0  . lactulose (CHRONULAC) 10 GM/15ML solution Take 30 mLs (20 g total) by mouth daily. 473 mL 3  . Nutritional Supplements (FEEDING SUPPLEMENT, NEPRO CARB STEADY,) LIQD Take 237 mLs by mouth 2 (two) times daily between meals. 60 Can 0  . propranolol (INDERAL) 40 MG tablet Take 1 tablet (40 mg total) by mouth 2 (two) times daily. 180 tablet 3   No current facility-administered medications for this visit.     ROS:   General:  No weight loss, Fever, chills  HEENT: No recent headaches, no nasal bleeding, no visual changes, no sore throat  Neurologic: No dizziness, blackouts, seizures. No recent symptoms of stroke or mini- stroke. No recent episodes of slurred speech, or temporary blindness.  Cardiac: No recent episodes of chest pain/pressure, no shortness of breath at rest.  No shortness of breath with exertion.  Denies history of atrial fibrillation or irregular heartbeat  Vascular: No history of rest pain in feet.  No history of claudication.  No history of non-healing ulcer, No history of DVT   Pulmonary: No home oxygen, no productive cough, no hemoptysis,  No asthma or wheezing  Musculoskeletal:  [ ]  Arthritis, [ ]  Low back pain,  [ ]  Joint pain  Hematologic:No history of hypercoagulable state.  No history of easy bleeding.  + history of anemia  Gastrointestinal: No hematochezia or melena,  No gastroesophageal reflux, no trouble swallowing  Urinary: [X]  chronic Kidney disease, [ ]  on HD - [ ]  MWF or [  ] TTHS, [ ]  Burning with urination, [ ]  Frequent urination, [ ]  Difficulty urinating;   Skin: No rashes  Psychological: No history of anxiety,  No history of depression   Physical Examination  Vitals:   05/16/18 1009  BP: (!) 177/76  Pulse: (!) 40  Resp: 18  SpO2: 96%  Weight: 113 lb (51.3 kg)  Height: 5'  3" (1.6 m)    Body mass index is 20.02 kg/m.  General:  Alert and oriented, no acute distress HEENT: Normal Neck: No bruit or JVD Pulmonary: Clear to auscultation bilaterally Cardiac: Regular Rate and Rhythm without murmur Abdomen: Soft, non-tender, non-distended, no mass Skin: No rash Extremity Pulses:  2+ radial, brachial pulses bilaterally Musculoskeletal: No deformity or edema  Neurologic: Upper and lower extremity motor 5/5 and symmetric  DATA:  Viewed the patient's vein mapping which showed a small basilic and cephalic vein bilaterally.  ASSESSMENT: Patient needs long-term hemodialysis.  She does not really appear to have adequate vein for creation of the fistula.   PLAN: Patient is scheduled to have a left upper arm graft placed May 21, 2018.  Risk benefits possible complications and procedure details were discussed with patient and her daughter today including but not limited to bleeding infection ischemic steal graft thrombosis.  They understand and agreed to proceed.  Of note the patient's platelet count has usually been in the 90-100 range.  If it remains in this range it will be acceptable for placement of her graft.  I did discuss with the patient and her daughter that she would be at some increased risk of bleeding.  This would probably manifest as either ecchymosis in the skin or possible hematoma.  They understand this as well.   Ruta Hinds, MD [facility] [place] Office: [phone] Pager: [phone]

## 2018-05-16 NOTE — Progress Notes (Signed)
Patient name: Sarah Harmon MRN: [id number] DOB: November 03, 1946 Sex: female  REASON FOR CONSULT: Dialysis access  HPI: Sarah Harmon is a 72 y.o. female was seen by my partner Dr. Donnetta Hutching as an in-hospital consult a few weeks ago.  He recommended placement of a left arm AV graft versus fistula.  The patient was then discharged to home because her kidney function was stable.  She now presents for placement of a long-term hemodialysis access.  She is right-handed.  CKD 5.  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  She has a history of pancytopenia including thrombocytopenia.  Other medical problems include hyperlipidemia, hypertension, hyponatremia all of which have been stable.  Past Medical History:  Diagnosis Date  . ALCOHOL ABUSE 07/13/2010  . ANXIETY 12/22/2009  . DEPRESSION 12/22/2009  . 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 DJD (degenerative joint disease), cervical    patient denies on preop of 05/10/15   . FATIGUE 12/23/2010  . GALLSTONES 12/22/2009  . Gallstones   . Headache    occasional   . HYPERLIPIDEMIA 12/22/2009  . HYPERTENSION 12/22/2009  . HYPONATREMIA 12/22/2009  . 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 OSTEOPENIA 12/22/2009  . Pancytopenia 07/13/2010  . RENAL CYST, LEFT 12/22/2009  . VITAMIN D DEFICIENCY 12/23/2010  . WEIGHT LOSS 12/23/2010   Past Surgical History:  Procedure Laterality Date  . ABDOMINAL HYSTERECTOMY    . IR GENERIC HISTORICAL  10/18/2016   IR RADIOLOGIST EVAL & MGMT 10/18/2016 [name], MD [facility] INTERV RAD  . IR RADIOLOGIST EVAL & MGMT  05/30/2017    Family History  Problem Relation Age of Onset  . Lung cancer Brother   . Breast cancer Other   . Breast cancer Sister   . Gastric cancer Sister   . Lung cancer Sister   . Colon cancer Neg Hx   . Colon polyps Neg Hx   . Rectal cancer Neg Hx   . Stomach cancer Neg Hx   . Esophageal cancer Neg Hx     SOCIAL HISTORY: Social History   Socioeconomic History  . Marital status:  Widowed    Spouse name: Not on file  . Number of children: 4  . Years of education: Not on file  . Highest education level: Not on file  Occupational History  . Occupation: retired  Scientific laboratory technician  . Financial resource strain: Not on file  . Food insecurity:    Worry: Not on file    Inability: Not on file  . Transportation needs:    Medical: Not on file    Non-medical: Not on file  Tobacco Use  . Smoking status: Never Smoker  . Smokeless tobacco: Never Used  Substance and Sexual Activity  . Alcohol use: Yes    Alcohol/week: 1.8 - 2.4 oz    Types: 3 - 4 Cans of beer per week    Comment: she quit 02/2015   . Drug use: No  . Sexual activity: Not on file  Lifestyle  . Physical activity:    Days per week: Not on file    Minutes per session: Not on file  . Stress: Not on file  Relationships  . Social connections:    Talks on phone: Not on file    Gets together: Not on file    Attends religious service: Not on file    Active member of club or organization: Not on file    Attends meetings of clubs or organizations: Not  on file    Relationship status: Not on file  . Intimate partner violence:    Fear of current or ex partner: Not on file    Emotionally abused: Not on file    Physically abused: Not on file    Forced sexual activity: Not on file  Other Topics Concern  . 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 ALPRAZolam (XANAX) 0.5 MG tablet TAKE 1 TABLET BY MOUTH ONCE DAILY AT BEDTIME 30 tablet 2  . amLODipine (NORVASC) 10 MG tablet TAKE 1 TABLET BY MOUTH ONCE DAILY 90 tablet 2  . Cholecalciferol (VITAMIN D3) 1000 UNITS CAPS Take 10,000 Units by mouth daily.     . citalopram (CELEXA) 20 MG tablet TAKE ONE-HALF TABLET BY MOUTH ONCE DAILY 45  tablet 3  . cloNIDine (CATAPRES) 0.1 MG tablet Take 1 tablet (0.1 mg total) by mouth daily. 60 tablet 1  . folic acid (FOLVITE) 1 MG tablet TAKE 1 TABLET BY MOUTH ONCE DAILY 90 tablet 0  . furosemide (LASIX) 40 MG tablet Take 1 tablet (40 mg total) by mouth daily. 30 tablet 0  . hydrALAZINE (APRESOLINE) 100 MG tablet TAKE 1 TABLET BY MOUTH THREE TIMES DAILY 270 tablet 0  . isosorbide dinitrate (ISORDIL) 30 MG tablet Take 1 tablet (30 mg total) by mouth 3 (three) times daily. 90 tablet 0  . lactulose (CHRONULAC) 10 GM/15ML solution Take 30 mLs (20 g total) by mouth daily. 473 mL 3  . Nutritional Supplements (FEEDING SUPPLEMENT, NEPRO CARB STEADY,) LIQD Take 237 mLs by mouth 2 (two) times daily between meals. 60 Can 0  . propranolol (INDERAL) 40 MG tablet Take 1 tablet (40 mg total) by mouth 2 (two) times daily. 180 tablet 3   No current facility-administered medications for this visit.     ROS:   General:  No weight loss, Fever, chills  HEENT: No recent headaches, no nasal bleeding, no visual changes, no sore throat  Neurologic: No dizziness, blackouts, seizures. No recent symptoms of stroke or mini- stroke. No recent episodes of slurred speech, or temporary blindness.  Cardiac: No recent episodes of chest pain/pressure, no shortness of breath at rest.  No shortness of breath with exertion.  Denies history of atrial fibrillation or irregular heartbeat  Vascular: No history of rest pain in feet.  No history of claudication.  No history of non-healing ulcer, No history of DVT   Pulmonary: No home oxygen, no productive cough, no hemoptysis,  No asthma or wheezing  Musculoskeletal:  [ ]  Arthritis, [ ]  Low back pain,  [ ]  Joint pain  Hematologic:No history of hypercoagulable state.  No history of easy bleeding.  + history of anemia  Gastrointestinal: No hematochezia or melena,  No gastroesophageal reflux, no trouble swallowing  Urinary: [X]  chronic Kidney disease, [ ]  on HD - [ ]  MWF or [  ] TTHS, [ ]  Burning with urination, [ ]  Frequent urination, [ ]  Difficulty urinating;   Skin: No rashes  Psychological: No history of anxiety,  No history of depression   Physical Examination  Vitals:   05/16/18 1009  BP: (!) 177/76  Pulse: (!) 40  Resp: 18  SpO2: 96%  Weight: 113 lb (51.3 kg)  Height: 5'  3" (1.6 m)    Body mass index is 20.02 kg/m.  General:  Alert and oriented, no acute distress HEENT: Normal Neck: No bruit or JVD Pulmonary: Clear to auscultation bilaterally Cardiac: Regular Rate and Rhythm without murmur Abdomen: Soft, non-tender, non-distended, no mass Skin: No rash Extremity Pulses:  2+ radial, brachial pulses bilaterally Musculoskeletal: No deformity or edema  Neurologic: Upper and lower extremity motor 5/5 and symmetric  DATA:  Viewed the patient's vein mapping which showed a small basilic and cephalic vein bilaterally.  ASSESSMENT: Patient needs long-term hemodialysis.  She does not really appear to have adequate vein for creation of the fistula.   PLAN: Patient is scheduled to have a left upper arm graft placed May 21, 2018.  Risk benefits possible complications and procedure details were discussed with patient and her daughter today including but not limited to bleeding infection ischemic steal graft thrombosis.  They understand and agreed to proceed.  Of note the patient's platelet count has usually been in the 90-100 range.  If it remains in this range it will be acceptable for placement of her graft.  I did discuss with the patient and her daughter that she would be at some increased risk of bleeding.  This would probably manifest as either ecchymosis in the skin or possible hematoma.  They understand this as well.   Ruta Hinds, MD [facility] [place] Office: [phone] Pager: [phone]

## 2018-05-16 NOTE — Telephone Encounter (Signed)
Sarah Harmon with Dr. Ruta Hinds office called stating the patient has preop labs with them the morning of 6/4, in order for her not to have to be stuck twice they will add our ordered labs to theirs.  We will be able to see the results in [facility]

## 2018-05-17 ENCOUNTER — Encounter: Payer: Self-pay | Admitting: Internal Medicine

## 2018-05-17 ENCOUNTER — Ambulatory Visit (INDEPENDENT_AMBULATORY_CARE_PROVIDER_SITE_OTHER): Payer: Medicare Other | Admitting: Internal Medicine

## 2018-05-17 VITALS — BP 100/62 | HR 45 | Temp 98.3°F | Ht 63.0 in | Wt 116.0 lb

## 2018-05-17 DIAGNOSIS — N184 Chronic kidney disease, stage 4 (severe): Secondary | ICD-10-CM

## 2018-05-17 DIAGNOSIS — I10 Essential (primary) hypertension: Secondary | ICD-10-CM

## 2018-05-17 DIAGNOSIS — N186 End stage renal disease: Secondary | ICD-10-CM | POA: Diagnosis not present

## 2018-05-17 DIAGNOSIS — D631 Anemia in chronic kidney disease: Secondary | ICD-10-CM

## 2018-05-17 DIAGNOSIS — E871 Hypo-osmolality and hyponatremia: Secondary | ICD-10-CM | POA: Diagnosis not present

## 2018-05-17 DIAGNOSIS — R7302 Impaired glucose tolerance (oral): Secondary | ICD-10-CM | POA: Diagnosis not present

## 2018-05-17 LAB — POCT GLYCOSYLATED HEMOGLOBIN (HGB A1C): Hemoglobin A1C: 4.7 % (ref 4.0–5.6)

## 2018-05-17 NOTE — Assessment & Plan Note (Signed)
BP labile it seems with sBP 177 yesterday, pt currently asympt, cont same tx

## 2018-05-17 NOTE — Patient Instructions (Addendum)
Your A1c fingerstick blood test was Good today  Please continue all other medications as before, and refills have been done if requested.  Please have the pharmacy call with any other refills you may need.  Please keep your appointments with your specialists as you may have planned - Vascular surgury, and Nephrology and Oncology, with the blod work planned for June 4

## 2018-05-17 NOTE — Progress Notes (Signed)
Subjective:    Patient ID: Sarah Harmon, female    DOB: 01-26-1946, 72 y.o.   MRN: [id number]  HPI  72 y.o.femalewith prior history of chronic kidney disease stage IV-V, cirrhosis, history of hepatocellular cancer status post radioablation in 2016, chronic hyponatremia-sent to the ED for evaluation of hyperkalemia and hyponatremia.Hemoblobin drop on 5/22, no active source, she received one unit of packed red blood cell per Nephrology Hyperkalemia:Secondary to possible Aldactone use (per nephrology)and use of supplemental use ofensure shakes at homein the background of chronic kidney disease stage IV-V. Appears to have chronic hyponatremia-improved with IV fluids-suspected to have hypovolemic hypotonic hyponatremia. Does have cirrhosis at baseline as well. Remains on Lasix  Nephrology consulted [facility] for future access procedure, vein mapping completed while inpatient.  Has cbc/cmp planned for June 4. Next renal appt July 2019, has seen vascular and procedure scheduled post hospital.   Pt denies polydipsia, polyuria.  No other new complaints or interval hx.   Past Medical History:  Diagnosis Date  . ALCOHOL ABUSE 07/13/2010  . ANXIETY 12/22/2009  . DEPRESSION 12/22/2009  . 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 DJD (degenerative joint disease), cervical    patient denies on preop of 05/10/15   . FATIGUE 12/23/2010  . GALLSTONES 12/22/2009  . Gallstones   . Headache    occasional   . HYPERLIPIDEMIA 12/22/2009  . HYPERTENSION 12/22/2009  . HYPONATREMIA 12/22/2009  . 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 OSTEOPENIA 12/22/2009  . Pancytopenia 07/13/2010  . RENAL CYST, LEFT 12/22/2009  . VITAMIN D DEFICIENCY 12/23/2010  . WEIGHT LOSS 12/23/2010   Past Surgical History:  Procedure Laterality Date  . ABDOMINAL HYSTERECTOMY    . IR GENERIC HISTORICAL  10/18/2016   IR RADIOLOGIST EVAL & MGMT 10/18/2016 [name], MD [facility] INTERV RAD  . IR RADIOLOGIST EVAL & MGMT  05/30/2017    reports that she has  never smoked. She has never used smokeless tobacco. She reports that she drinks about 1.8 - 2.4 oz of alcohol per week. She reports that she does not use drugs. family history includes Breast cancer in her other and sister; Gastric cancer in her sister; Lung cancer in her brother and sister. No Known Allergies Current Outpatient Medications on File Prior to Visit  Medication Sig Dispense Refill  . ALPRAZolam (XANAX) 0.5 MG tablet TAKE 1 TABLET BY MOUTH ONCE DAILY AT BEDTIME 30 tablet 2  . amLODipine (NORVASC) 10 MG tablet TAKE 1 TABLET BY MOUTH ONCE DAILY 90 tablet 2  . Cholecalciferol (VITAMIN D3) 1000 UNITS CAPS Take 10,000 Units by mouth daily.     . citalopram (CELEXA) 20 MG tablet TAKE ONE-HALF TABLET BY MOUTH ONCE DAILY 45 tablet 3  . cloNIDine (CATAPRES) 0.1 MG tablet Take 1 tablet (0.1 mg total) by mouth daily. 60 tablet 1  . folic acid (FOLVITE) 1 MG tablet TAKE 1 TABLET BY MOUTH ONCE DAILY 90 tablet 0  . furosemide (LASIX) 40 MG tablet Take 1 tablet (40 mg total) by mouth daily. 30 tablet 0  . hydrALAZINE (APRESOLINE) 100 MG tablet TAKE 1 TABLET BY MOUTH THREE TIMES DAILY 270 tablet 0  . isosorbide dinitrate (ISORDIL) 30 MG tablet Take 1 tablet (30 mg total) by mouth 3 (three) times daily. 90 tablet 0  . lactulose (CHRONULAC) 10 GM/15ML solution Take 30 mLs (20 g total) by mouth daily. 473 mL 3  . Nutritional Supplements (FEEDING SUPPLEMENT, NEPRO CARB STEADY,) LIQD Take 237 mLs by mouth 2 (two) times daily  between meals. 60 Can 0  . propranolol (INDERAL) 40 MG tablet Take 1 tablet (40 mg total) by mouth 2 (two) times daily. 180 tablet 3   No current facility-administered medications on file prior to visit.    Review of Systems  Constitutional: Negative for other unusual diaphoresis or sweats HENT: Negative for ear discharge or swelling Eyes: Negative for other worsening visual disturbances Respiratory: Negative for stridor or other swelling  Gastrointestinal: Negative for  worsening distension or other blood Genitourinary: Negative for retention or other urinary change Musculoskeletal: Negative for other MSK pain or swelling Skin: Negative for color change or other new lesions Neurological: Negative for worsening tremors and other numbness  Psychiatric/Behavioral: Negative for worsening agitation or other fatigue All other system neg per pt    Objective:   Physical Exam BP 100/62   Pulse (!) 45   Temp 98.3 F (36.8 C) (Oral)   Ht 5\' 3"  (1.6 m)   Wt 116 lb (52.6 kg)   SpO2 98%   BMI 20.55 kg/m  VS noted,  Constitutional: Pt appears in NAD HENT: Head: NCAT.  Right Ear: External ear normal.  Left Ear: External ear normal.  Eyes: . Pupils are equal, round, and reactive to light. Conjunctivae and EOM are normal Nose: without d/c or deformity Neck: Neck supple. Gross normal ROM Cardiovascular: Normal rate and regular rhythm.   Pulmonary/Chest: Effort normal and breath sounds without rales or wheezing.  Abd:  Soft, NT, ND, + BS, no organomegaly Neurological: Pt is alert. At baseline orientation, motor grossly intact Skin: Skin is warm. No rashes, other new lesions, no LE edema Psychiatric: Pt behavior is normal without agitation  No other exam findings  Lab Results  Component Value Date   WBC 2.9 (L) 05/09/2018   HGB 9.4 (L) 05/09/2018   HCT 27.6 (L) 05/09/2018   PLT 92 (L) 05/09/2018   GLUCOSE 90 05/09/2018   CHOL 243 (H) 09/04/2017   TRIG 233.0 (H) 09/04/2017   HDL 128.30 09/04/2017   LDLDIRECT 85.0 09/04/2017   LDLCALC 77 02/28/2017   ALT 13 (L) 05/07/2018   AST 24 05/07/2018   NA 126 (L) 05/09/2018   K 3.8 05/09/2018   CL 92 (L) 05/09/2018   CREATININE 3.73 (H) 05/09/2018   BUN 41 (H) 05/09/2018   CO2 24 05/09/2018   TSH 1.58 09/04/2017   INR 1.11 09/09/2017   HGBA1C 5.7 02/28/2017    POCT - A1c  - today - POCT glycosylated hemoglobin (Hb A1C)  Order: [id number]  Status:  Final result Visible to patient:  No (Not Released)  Dx:  Impaired glucose tolerance   Ref Range & Units 10:27 86yr ago 2yr ago 75yr ago  Hemoglobin A1C 4.0 - 5.6 % 4.7  5.7 R, CM 5.8 R, CM 6.2 R, CM            Assessment & Plan:

## 2018-05-18 NOTE — Assessment & Plan Note (Signed)
Lab Results  Component Value Date   HGB 9.4 (L) 05/09/2018  stable overall by history and exam, recent data reviewed with pt, and pt to continue medical treatment as before,  to f/u any worsening symptoms or concerns

## 2018-05-18 NOTE — Assessment & Plan Note (Addendum)
Stable, for f/u labs, and future access procedure soon  Note:  Total time for pt hx, exam, review of record with pt in the room, determination of diagnoses and plan for further eval and tx is > 40 min, with over 50% spent in coordination and counseling of patient including the differential dx, tx, further evaluation and other management of ESRD, anemia, hyperglycemia, HTN and hyponatremia

## 2018-05-18 NOTE — Assessment & Plan Note (Signed)
Chronic but with some element of hypovolemic hypotonic at last hospn, for f/u with lab

## 2018-05-18 NOTE — Assessment & Plan Note (Signed)
stable overall by history and exam, recent data reviewed with pt, and pt to continue medical treatment as before,  to f/u any worsening symptoms or concerns Lab Results  Component Value Date   HGBA1C 4.7 05/17/2018

## 2018-05-20 ENCOUNTER — Other Ambulatory Visit: Payer: Self-pay

## 2018-05-20 ENCOUNTER — Encounter (HOSPITAL_COMMUNITY): Payer: Self-pay | Admitting: *Deleted

## 2018-05-20 NOTE — Anesthesia Preprocedure Evaluation (Addendum)
Anesthesia Evaluation  Patient identified by MRN, date of birth, ID band Patient awake    Reviewed: Allergy & Precautions, NPO status , Patient's Chart, lab work & pertinent test results  Airway Mallampati: I  TM Distance: >3 FB Neck ROM: Full    Dental  (+) Edentulous Upper, Edentulous Lower   Pulmonary neg pulmonary ROS,    breath sounds clear to auscultation       Cardiovascular hypertension, Pt. on medications  Rhythm:Regular Rate:Normal     Neuro/Psych  Headaches, PSYCHIATRIC DISORDERS Anxiety Depression    GI/Hepatic negative GI ROS, (+)     substance abuse  alcohol use, Liver cancer   Endo/Other  negative endocrine ROS  Renal/GU ESRF and CRFRenal disease     Musculoskeletal  (+) Arthritis ,   Abdominal   Peds  Hematology negative hematology ROS (+)   Anesthesia Other Findings Day of surgery medications reviewed with the patient.  Reproductive/Obstetrics                           Anesthesia Physical Anesthesia Plan  ASA: IV  Anesthesia Plan: General   Post-op Pain Management:    Induction: Intravenous  PONV Risk Score and Plan: 3 and Ondansetron and Treatment may vary due to age or medical condition  Airway Management Planned: Oral ETT  Additional Equipment:   Intra-op Plan:   Post-operative Plan: Extubation in OR  Informed Consent:   Plan Discussed with:   Anesthesia Plan Comments:         Anesthesia Quick Evaluation

## 2018-05-21 ENCOUNTER — Ambulatory Visit (HOSPITAL_COMMUNITY): Payer: Medicare Other | Admitting: Certified Registered"

## 2018-05-21 ENCOUNTER — Telehealth: Payer: Self-pay | Admitting: Hematology

## 2018-05-21 ENCOUNTER — Inpatient Hospital Stay: Payer: Medicare Other | Attending: Hematology | Admitting: Hematology

## 2018-05-21 ENCOUNTER — Encounter: Payer: Self-pay | Admitting: Hematology

## 2018-05-21 ENCOUNTER — Ambulatory Visit: Payer: Medicare Other | Admitting: Hematology

## 2018-05-21 ENCOUNTER — Ambulatory Visit (HOSPITAL_COMMUNITY)
Admission: RE | Admit: 2018-05-21 | Discharge: 2018-05-21 | Disposition: A | Discharge status: Home / Self Care | Payer: Medicare Other | Source: Ambulatory Visit | Attending: Vascular Surgery | Admitting: Vascular Surgery

## 2018-05-21 ENCOUNTER — Encounter (HOSPITAL_COMMUNITY)
Admission: RE | Disposition: A | Discharge status: Home / Self Care | Payer: Self-pay | Source: Ambulatory Visit | Attending: Vascular Surgery

## 2018-05-21 ENCOUNTER — Other Ambulatory Visit: Payer: Medicare Other

## 2018-05-21 ENCOUNTER — Encounter (HOSPITAL_COMMUNITY): Payer: Self-pay | Admitting: Certified Registered"

## 2018-05-21 VITALS — BP 130/65 | HR 40 | Temp 97.5°F | Resp 18 | Ht 63.0 in | Wt 109.7 lb

## 2018-05-21 DIAGNOSIS — R109 Unspecified abdominal pain: Secondary | ICD-10-CM | POA: Diagnosis not present

## 2018-05-21 DIAGNOSIS — E785 Hyperlipidemia, unspecified: Secondary | ICD-10-CM | POA: Diagnosis not present

## 2018-05-21 DIAGNOSIS — K7030 Alcoholic cirrhosis of liver without ascites: Secondary | ICD-10-CM | POA: Insufficient documentation

## 2018-05-21 DIAGNOSIS — F329 Major depressive disorder, single episode, unspecified: Secondary | ICD-10-CM | POA: Insufficient documentation

## 2018-05-21 DIAGNOSIS — Z79899 Other long term (current) drug therapy: Secondary | ICD-10-CM | POA: Insufficient documentation

## 2018-05-21 DIAGNOSIS — N185 Chronic kidney disease, stage 5: Secondary | ICD-10-CM | POA: Diagnosis not present

## 2018-05-21 DIAGNOSIS — I120 Hypertensive chronic kidney disease with stage 5 chronic kidney disease or end stage renal disease: Secondary | ICD-10-CM | POA: Diagnosis not present

## 2018-05-21 DIAGNOSIS — F1010 Alcohol abuse, uncomplicated: Secondary | ICD-10-CM | POA: Diagnosis not present

## 2018-05-21 DIAGNOSIS — Z800 Family history of malignant neoplasm of digestive organs: Secondary | ICD-10-CM | POA: Insufficient documentation

## 2018-05-21 DIAGNOSIS — F419 Anxiety disorder, unspecified: Secondary | ICD-10-CM | POA: Insufficient documentation

## 2018-05-21 DIAGNOSIS — C220 Liver cell carcinoma: Secondary | ICD-10-CM | POA: Diagnosis not present

## 2018-05-21 DIAGNOSIS — N186 End stage renal disease: Secondary | ICD-10-CM | POA: Insufficient documentation

## 2018-05-21 DIAGNOSIS — D631 Anemia in chronic kidney disease: Secondary | ICD-10-CM | POA: Diagnosis not present

## 2018-05-21 HISTORY — DX: Malignant (primary) neoplasm, unspecified: C80.1

## 2018-05-21 HISTORY — PX: AV FISTULA PLACEMENT: SHX1204

## 2018-05-21 LAB — CBC WITH DIFFERENTIAL/PLATELET
Abs Immature Granulocytes: 0 10*3/uL (ref 0.0–0.1)
Basophils Absolute: 0 10*3/uL (ref 0.0–0.1)
Basophils Relative: 0 %
Eosinophils Absolute: 0.1 10*3/uL (ref 0.0–0.7)
Eosinophils Relative: 3 %
HEMATOCRIT: 40.6 % (ref 36.0–46.0)
HEMOGLOBIN: 13.6 g/dL (ref 12.0–15.0)
IMMATURE GRANULOCYTES: 0 %
LYMPHS ABS: 0.6 10*3/uL — AB (ref 0.7–4.0)
Lymphocytes Relative: 14 %
MCH: 29.1 pg (ref 26.0–34.0)
MCHC: 33.5 g/dL (ref 30.0–36.0)
MCV: 86.9 fL (ref 78.0–100.0)
MONOS PCT: 11 %
Monocytes Absolute: 0.5 10*3/uL (ref 0.1–1.0)
NEUTROS PCT: 72 %
Neutro Abs: 3.2 10*3/uL (ref 1.7–7.7)
Platelets: 119 10*3/uL — ABNORMAL LOW (ref 150–400)
RBC: 4.67 MIL/uL (ref 3.87–5.11)
RDW: 14.5 % (ref 11.5–15.5)
WBC: 4.5 10*3/uL (ref 4.0–10.5)

## 2018-05-21 LAB — COMPREHENSIVE METABOLIC PANEL
ALT: 19 U/L (ref 14–54)
ANION GAP: 11 (ref 5–15)
AST: 27 U/L (ref 15–41)
Albumin: 3.8 g/dL (ref 3.5–5.0)
Alkaline Phosphatase: 49 U/L (ref 38–126)
BUN: 42 mg/dL — ABNORMAL HIGH (ref 6–20)
CHLORIDE: 98 mmol/L — AB (ref 101–111)
CO2: 20 mmol/L — AB (ref 22–32)
Calcium: 9.9 mg/dL (ref 8.9–10.3)
Creatinine, Ser: 3.36 mg/dL — ABNORMAL HIGH (ref 0.44–1.00)
GFR calc Af Amer: 15 mL/min — ABNORMAL LOW (ref >60)
GFR calc non Af Amer: 13 mL/min — ABNORMAL LOW (ref >60)
Glucose, Bld: 101 mg/dL — ABNORMAL HIGH (ref 65–99)
POTASSIUM: 5.2 mmol/L — AB (ref 3.5–5.1)
SODIUM: 129 mmol/L — AB (ref 135–145)
Total Bilirubin: 1 mg/dL (ref 0.3–1.2)
Total Protein: 8.1 g/dL (ref 6.5–8.1)

## 2018-05-21 LAB — PROTIME-INR
INR: 1.03
PROTHROMBIN TIME: 13.4 s (ref 11.4–15.2)

## 2018-05-21 SURGERY — INSERTION OF ARTERIOVENOUS (AV) GORE-TEX GRAFT ARM
Anesthesia: General | Site: Arm Upper | Laterality: Left

## 2018-05-21 MED ORDER — HEPARIN SODIUM (PORCINE) 1000 UNIT/ML IJ SOLN
INTRAMUSCULAR | Status: DC | PRN
  Administered 2018-05-21: 3000 [IU] via INTRAVENOUS

## 2018-05-21 MED ORDER — HEPARIN SODIUM (PORCINE) 1000 UNIT/ML IJ SOLN
INTRAMUSCULAR | Status: AC
Start: 2018-05-21 — End: ?
  Filled 2018-05-21: qty 1

## 2018-05-21 MED ORDER — HYDROCODONE-ACETAMINOPHEN 5-325 MG PO TABS: 1.0000 | ORAL_TABLET | Freq: Four times a day (QID) | ORAL | 0 refills | Status: DC | PRN

## 2018-05-21 MED ORDER — PHENYLEPHRINE 40 MCG/ML (10ML) SYRINGE FOR IV PUSH (FOR BLOOD PRESSURE SUPPORT)
PREFILLED_SYRINGE | INTRAVENOUS | Status: DC | PRN
  Administered 2018-05-21: 80 ug via INTRAVENOUS
  Administered 2018-05-21: 120 ug via INTRAVENOUS
  Administered 2018-05-21: 40 ug via INTRAVENOUS

## 2018-05-21 MED ORDER — FENTANYL CITRATE (PF) 250 MCG/5ML IJ SOLN
INTRAMUSCULAR | Status: DC | PRN
  Administered 2018-05-21 (×2): 50 ug via INTRAVENOUS

## 2018-05-21 MED ORDER — CEFAZOLIN SODIUM-DEXTROSE 2-4 GM/100ML-% IV SOLN
2.0000 g | INTRAVENOUS | Status: AC
  Administered 2018-05-21: 2 g via INTRAVENOUS
  Filled 2018-05-21: qty 100

## 2018-05-21 MED ORDER — 0.9 % SODIUM CHLORIDE (POUR BTL) OPTIME
TOPICAL | Status: DC | PRN
  Administered 2018-05-21: 1000 mL

## 2018-05-21 MED ORDER — PROPOFOL 10 MG/ML IV BOLUS
INTRAVENOUS | Status: DC | PRN
  Administered 2018-05-21: 100 mg via INTRAVENOUS
  Administered 2018-05-21: 20 mg via INTRAVENOUS
  Administered 2018-05-21: 40 mg via INTRAVENOUS

## 2018-05-21 MED ORDER — PROPOFOL 10 MG/ML IV BOLUS
INTRAVENOUS | Status: AC
  Filled 2018-05-21: qty 20

## 2018-05-21 MED ORDER — PROTAMINE SULFATE 10 MG/ML IV SOLN
INTRAVENOUS | Status: DC | PRN
  Administered 2018-05-21 (×2): 15 mg via INTRAVENOUS

## 2018-05-21 MED ORDER — LIDOCAINE 2% (20 MG/ML) 5 ML SYRINGE
INTRAMUSCULAR | Status: DC | PRN
  Administered 2018-05-21: 100 mg via INTRAVENOUS

## 2018-05-21 MED ORDER — LIDOCAINE HCL (PF) 1 % IJ SOLN
INTRAMUSCULAR | Status: AC
  Filled 2018-05-21: qty 30

## 2018-05-21 MED ORDER — SODIUM CHLORIDE 0.9 % IJ SOLN
INTRAMUSCULAR | Status: AC
  Filled 2018-05-21: qty 10

## 2018-05-21 MED ORDER — EPHEDRINE SULFATE 50 MG/ML IJ SOLN
INTRAMUSCULAR | Status: AC
  Filled 2018-05-21: qty 1

## 2018-05-21 MED ORDER — DEXTROSE 5 % IV SOLN
INTRAVENOUS | Status: DC | PRN
  Administered 2018-05-21: 20 ug/min via INTRAVENOUS

## 2018-05-21 MED ORDER — SODIUM CHLORIDE 0.9 % IV SOLN
INTRAVENOUS | Status: AC
  Filled 2018-05-21: qty 1.2

## 2018-05-21 MED ORDER — MIDAZOLAM HCL 2 MG/2ML IJ SOLN
INTRAMUSCULAR | Status: DC | PRN
  Administered 2018-05-21: 1 mg via INTRAVENOUS

## 2018-05-21 MED ORDER — MIDAZOLAM HCL 2 MG/2ML IJ SOLN
INTRAMUSCULAR | Status: AC
  Filled 2018-05-21: qty 2

## 2018-05-21 MED ORDER — ONDANSETRON HCL 4 MG/2ML IJ SOLN
INTRAMUSCULAR | Status: DC | PRN
  Administered 2018-05-21: 4 mg via INTRAVENOUS

## 2018-05-21 MED ORDER — SODIUM CHLORIDE 0.9 % IV SOLN
INTRAVENOUS | Status: DC | PRN
  Administered 2018-05-21: 500 mL

## 2018-05-21 MED ORDER — SODIUM CHLORIDE 0.9 % IV SOLN
INTRAVENOUS | Status: DC
  Administered 2018-05-21: 07:00:00 via INTRAVENOUS

## 2018-05-21 MED ORDER — FENTANYL CITRATE (PF) 250 MCG/5ML IJ SOLN
INTRAMUSCULAR | Status: AC
  Filled 2018-05-21: qty 5

## 2018-05-21 SURGICAL SUPPLY — 32 items
ADH SKN CLS APL DERMABOND .7 (GAUZE/BANDAGES/DRESSINGS) ×1
AGENT HMST SPONGE THK3/8 (HEMOSTASIS)
ARMBAND PINK RESTRICT EXTREMIT (MISCELLANEOUS) ×4 IMPLANT
CANISTER SUCT 3000ML PPV (MISCELLANEOUS) ×3 IMPLANT
CANNULA VESSEL 3MM 2 BLNT TIP (CANNULA) ×3 IMPLANT
CLIP VESOCCLUDE MED 6/CT (CLIP) ×3 IMPLANT
CLIP VESOCCLUDE SM WIDE 6/CT (CLIP) ×3 IMPLANT
DECANTER SPIKE VIAL GLASS SM (MISCELLANEOUS) ×3 IMPLANT
DERMABOND ADVANCED (GAUZE/BANDAGES/DRESSINGS) ×2
DERMABOND ADVANCED .7 DNX12 (GAUZE/BANDAGES/DRESSINGS) ×1 IMPLANT
ELECT REM PT RETURN 9FT ADLT (ELECTROSURGICAL) ×3
ELECTRODE REM PT RTRN 9FT ADLT (ELECTROSURGICAL) ×1 IMPLANT
GLOVE BIO SURGEON STRL SZ7.5 (GLOVE) ×3 IMPLANT
GOWN STRL REUS W/ TWL LRG LVL3 (GOWN DISPOSABLE) ×3 IMPLANT
GOWN STRL REUS W/ TWL XL LVL3 (GOWN DISPOSABLE) IMPLANT
GOWN STRL REUS W/TWL LRG LVL3 (GOWN DISPOSABLE) ×9
GOWN STRL REUS W/TWL XL LVL3 (GOWN DISPOSABLE) ×3
GRAFT GORETEX STRT 4-7X45 (Vascular Products) ×2 IMPLANT
HEMOSTAT SPONGE AVITENE ULTRA (HEMOSTASIS) IMPLANT
KIT BASIN OR (CUSTOM PROCEDURE TRAY) ×3 IMPLANT
KIT TURNOVER KIT B (KITS) ×3 IMPLANT
NS IRRIG 1000ML POUR BTL (IV SOLUTION) ×3 IMPLANT
PACK CV ACCESS (CUSTOM PROCEDURE TRAY) ×3 IMPLANT
PAD ARMBOARD 7.5X6 YLW CONV (MISCELLANEOUS) ×6 IMPLANT
SUT PROLENE 6 0 CC (SUTURE) ×8 IMPLANT
SUT VIC AB 3-0 SH 27 (SUTURE) ×6
SUT VIC AB 3-0 SH 27X BRD (SUTURE) ×2 IMPLANT
SUT VICRYL 4-0 PS2 18IN ABS (SUTURE) ×6 IMPLANT
SYR TOOMEY 50ML (SYRINGE) IMPLANT
TOWEL GREEN STERILE (TOWEL DISPOSABLE) ×3 IMPLANT
UNDERPAD 30X30 (UNDERPADS AND DIAPERS) ×3 IMPLANT
WATER STERILE IRR 1000ML POUR (IV SOLUTION) ×3 IMPLANT

## 2018-05-21 NOTE — Anesthesia Procedure Notes (Signed)
Procedure Name: LMA Insertion Date/Time: 05/21/2018 7:41 AM Performed by: Barrington Ellison, CRNA Pre-anesthesia Checklist: Patient identified, Emergency Drugs available, Suction available and Patient being monitored Patient Re-evaluated:Patient Re-evaluated prior to induction Oxygen Delivery Method: Circle System Utilized Preoxygenation: Pre-oxygenation with 100% oxygen Induction Type: IV induction Ventilation: Mask ventilation without difficulty LMA: LMA inserted LMA Size: 4.0 Number of attempts: 1 Placement Confirmation: positive ETCO2 Tube secured with: Tape Dental Injury: Teeth and Oropharynx as per pre-operative assessment

## 2018-05-21 NOTE — Telephone Encounter (Signed)
Gave pt avs and calendar with appts per 6/4 los.  °

## 2018-05-21 NOTE — Anesthesia Postprocedure Evaluation (Signed)
Anesthesia Post Note  Patient: Sarah Harmon  Procedure(s) Performed: INSERTION OF ARTERIOVENOUS (AV) GORE-TEX GRAFT UPPER ARM (Left Arm Upper)     Patient location during evaluation: PACU Anesthesia Type: General Level of consciousness: awake and alert Pain management: pain level controlled Vital Signs Assessment: post-procedure vital signs reviewed and stable Respiratory status: spontaneous breathing, nonlabored ventilation and respiratory function stable Cardiovascular status: blood pressure returned to baseline and stable Postop Assessment: no apparent nausea or vomiting Anesthetic complications: no    Last Vitals:  Vitals:   05/21/18 0955 05/21/18 1004  BP: (!) 162/69 (!) 165/67  Pulse: (!) 53 (!) 56  Resp: 17 18  Temp: 36.6 C   SpO2: 97% 98%    Last Pain:  Vitals:   05/21/18 1004  TempSrc:   PainSc: 0-No pain                 Catalina Gravel

## 2018-05-21 NOTE — Progress Notes (Signed)
[place]  [phone] Fax:([phone]  Clinic Follow Up Note   Patient Care Team: Biagio Borg, MD as PCP - Charissa Bash, MD as Consulting Physician (Hematology) Corliss Parish, MD as Consulting Physician (Nephrology)   Date of Service:  05/21/2018  CHIEF COMPLAINTS:  Follow up stage I Hepatocellular carcinoma  Oncology History   Hepatocellular carcinoma   Staging form: Liver (Excluding Intrahepatic Bile Ducts), AJCC 7th Edition     Clinical: Stage I (T1, N0, M0) - Unsigned       Hepatocellular carcinoma ([place])   03/01/2015 Tumor Marker    AFP 8.4      03/01/2015 Imaging    Liver MRI showed home 1.5 cm hyperenhancing T2 bright lesion within the right hepatic lobe, consistent with hepatocellular carcinoma. There is an indeterminate 1cm T2 bright lesion within the hepatic dome, w/o definitive arterial phase enhancement       03/01/2015 Initial Diagnosis    Hepatocellular carcinoma      05/14/2015 Procedure    CT guided thermal ablation of hepatocellular carcinoma      12/24/2015 Procedure    Upper Endoscopy: 12/24/15 ENDOSCOPIC IMPRESSION: There was mild to moderate erosive gastritis (mid and distal stomach). This was not classic appearing for portal gastropathy. Biopsies were taken from antrum and body to check for H. pylori, portal gastropathy, GAVE. The examination was otherwise normal. There were no gastric or esophageal varices      12/24/2015 Procedure    Colonoscopy: 12/24/15 ENDOSCOPIC IMPRESSION:  1. Mild diverticulosis was noted in the left colon 2. The examination was otherwise normal RADIOGRAPHIC STUDIES: I have personally reviewed the radiological images as listed and agreed with the findings in the report.       01/25/2016 Imaging    liver MRI with and without contrast showed further decreased in size of ablation defect within the segment 8 of the liver compatible with treated tumor,no new enhancing liver lesions  identified. Liver cirrhosis.      09/06/2016 Imaging     MRI ABD 09/06/16 IMPRESSION: 1. Minimal further size reduction at the ablation site, no recurrence or new focus of hepatocellular carcinoma identified. 2. Complex cystic lesion of the left kidney upper pole with some thin internal septations but no measurable enhancement, has minimally increased in size over the past 9 years. High likelihood that this is a benign lesion, categorized as Bosniak category IIF in the past. I would suggest ancillary surveillance at the time of liver follow up. 3. Mild chronic gallbladder wall thickening, with a stone lodged in the neck of the gallbladder. Mild chronic gallbladder distention.       05/15/2017 Imaging    MRI Abdomen WO Contrast 05/15/17 IMPRESSION: Ablation zone in segment 8 is favored to have mildly decreased in size, now measuring 1.4 cm. This is incompletely evaluated in the absence of intravenous contrast. Otherwise, no focal hepatic lesion is seen. Suspected mild cirrhosis. 4.0 x 3.2 cm complex/septated left upper pole renal cyst, incompletely evaluated, but grossly unchanged.      10/31/2017 Imaging    MRI Abdomen WO Contrast 10/31/17  IMPRESSION: 1. Stable appearance of the ablation defect in segment VIII of the liver. No new liver abnormality on the current study. 2. Subtle nodularity of hepatic contour suggests cirrhosis. 3. Gallbladder distension with gallstone again noted lodged in the gallbladder neck/cystic duct. No associated biliary dilatation. 4. Stable appearance complex/septated cyst upper pole left kidney on this noncontrast exam. Lesion remains compatible with Bosniak II  F lesion as previously characterized.      HISTORY OF PRESENTING ILLNESS:  JAMAE TISON 72 y.o. female is here because of abnormal liver MRI, which is highly suspicious for [facility].  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 No history of ascites or GI bleeding. She has been followed I her primary care physician since then, has not been read by GI. she did quit drinking a few times, and started drinking again about 2 years ago.  She was seen by PCP for routine checkup and was found to have low sodium, she was referred to Hospital and was admitted. She had abdominal ultrasound done on 05/31/2015 which showed liver cirrhosis and a questionable mass in the right lobe measuring 2 cm. She underwent a liver MRI on same day, which showed a 1.5 cm enhancing lesion in the right lobe of liver, and an additional 1.0 cm indeterminate lesion in the liver. AFP was found to be elevated at 8.4. No liver biopsy was performed. She was subsequently referred to our cancer center for further evaluation.  She lives with her son at home. She is able to do all her ADLs, and some light housework. She goes out for shopping with her daughters, does not drive. She denies any abdominal discomfort or bloating, no nausea, vomiting, diarrhea or constipation. Her appetite and energy level is moderate, unchanged from before. No recent weight loss.  CURRENT THERAPY: Surveillance  INTERIM HISTORY   Vivika returns for follow-up for her early stage liver cancer. She was last seen by me 6 months ago. She presents to the clinic today with her daughter.  Earlier today she underwent an arteriovenous graph of upper left arm. The procedure went well. This was for her dialysis that she plans to start soon. She was previously in the hospital for her hypokalemia in 04/2018. She plans to follow up with Dr. Kathlene Cote in 1 year from 05/2017.    On review of symptoms, pt notes lower abdominal pain with normal bowel movements. She denies being bloated. She notes having headaches occasionally.     MEDICAL HISTORY:  Past Medical History:  Diagnosis Date  . ALCOHOL ABUSE 07/13/2010  . ANXIETY 12/22/2009  . Cancer [facility])    liver cancer  .  DEPRESSION 12/22/2009  . 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 DJD (degenerative joint disease), cervical    patient denies on preop of 05/10/15   . FATIGUE 12/23/2010  . GALLSTONES 12/22/2009  . Gallstones   . Headache    occasional   . HYPERLIPIDEMIA 12/22/2009  . HYPERTENSION 12/22/2009  . HYPONATREMIA 12/22/2009  . 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 OSTEOPENIA 12/22/2009  . Pancytopenia 07/13/2010  . RENAL CYST, LEFT 12/22/2009  . VITAMIN D DEFICIENCY 12/23/2010  . WEIGHT LOSS 12/23/2010    SURGICAL HISTORY: Past Surgical History:  Procedure Laterality Date  . ABDOMINAL HYSTERECTOMY    . IR GENERIC HISTORICAL  10/18/2016   IR RADIOLOGIST EVAL & MGMT 10/18/2016 [name], MD [facility] INTERV RAD  . IR RADIOLOGIST EVAL & MGMT  05/30/2017  . surgery for liver ca      SOCIAL HISTORY: History   Social History  . Marital Status: Widowed    Spouse Name: N/A  . Number of Children: 5, she lives with one of her son   . Years of Education: N/A   Occupational History  . Retired    Social History Main  Topics  . Smoking status: Never Smoker   . Smokeless tobacco: Not on file  . Alcohol Use: Yes  . Drug Use: No  . Sexual Activity: Not on file   Other Topics Concern  . Not on file   Social History Narrative    FAMILY HISTORY: Family History  Problem Relation Age of Onset  . Lung cancer Brother   . Breast cancer Other   . Breast cancer Sister   . Gastric cancer Sister   . Lung cancer Sister   . Colon cancer Neg Hx   . Colon polyps Neg Hx   . Rectal cancer Neg Hx   . Stomach cancer Neg Hx   . Esophageal cancer Neg Hx     ALLERGIES:  has No Known Allergies.  MEDICATIONS:  Current Outpatient Medications  Medication Sig Dispense Refill  . ALPRAZolam (XANAX) 0.5 MG tablet TAKE 1 TABLET BY MOUTH ONCE DAILY AT BEDTIME 30 tablet 2  . amLODipine (NORVASC) 10 MG tablet TAKE 1 TABLET BY MOUTH ONCE DAILY 90 tablet 2  . Cholecalciferol (VITAMIN D3) 1000 UNITS CAPS Take 10,000  Units by mouth daily.     . citalopram (CELEXA) 20 MG tablet TAKE ONE-HALF TABLET BY MOUTH ONCE DAILY 45 tablet 3  . folic acid (FOLVITE) 1 MG tablet TAKE 1 TABLET BY MOUTH ONCE DAILY 90 tablet 0  . hydrALAZINE (APRESOLINE) 100 MG tablet TAKE 1 TABLET BY MOUTH THREE TIMES DAILY 270 tablet 0  . HYDROcodone-acetaminophen (NORCO) 5-325 MG tablet Take 1 tablet by mouth every 6 (six) hours as needed for moderate pain. 10 tablet 0  . lactulose (CHRONULAC) 10 GM/15ML solution Take 30 mLs (20 g total) by mouth daily. 473 mL 3  . propranolol (INDERAL) 40 MG tablet Take 1 tablet (40 mg total) by mouth 2 (two) times daily. 180 tablet 3  . cloNIDine (CATAPRES) 0.1 MG tablet Take 1 tablet (0.1 mg total) by mouth daily. (Patient not taking: Reported on 05/21/2018) 60 tablet 1  . furosemide (LASIX) 40 MG tablet Take 1 tablet (40 mg total) by mouth daily. (Patient not taking: Reported on 05/21/2018) 30 tablet 0  . isosorbide dinitrate (ISORDIL) 30 MG tablet Take 1 tablet (30 mg total) by mouth 3 (three) times daily. (Patient not taking: Reported on 05/21/2018) 90 tablet 0  . Nutritional Supplements (FEEDING SUPPLEMENT, NEPRO CARB STEADY,) LIQD Take 237 mLs by mouth 2 (two) times daily between meals. (Patient not taking: Reported on 05/21/2018) 60 Can 0   No current facility-administered medications for this visit.     REVIEW OF SYSTEMS:    Constitutional: Denies fevers, chills or abnormal night sweats Eyes: Denies blurriness of vision, double vision or watery eyes Ears, nose, mouth, throat, and face: Denies mucositis or sore throat Respiratory: Denies cough, dyspnea or wheezes Cardiovascular: Denies palpitation, chest discomfort or lower extremity swelling Gastrointestinal:  Denies nausea, heartburn or change in bowel habits Skin: Denies abnormal skin rashes Lymphatics: Denies new lymphadenopathy or easy bruising Neurological:Denies numbness, tingling or new weaknesses Behavioral/Psych: Mood is stable, no new  changes  All other systems were reviewed with the patient and are negative.  PHYSICAL EXAMINATION: ECOG PERFORMANCE STATUS: 1 - Symptomatic but completely ambulatory  Vitals:   05/21/18 1528  BP: 130/65  Pulse: (!) 40  Resp: 18  Temp: (!) 97.5 F (36.4 C)  SpO2: 100%   Filed Weights   05/21/18 1528  Weight: 109 lb 11.2 oz (49.8 kg)    GENERAL:alert, no distress and comfortable SKIN:  skin color, texture, turgor are normal, no rashes or significant lesions EYES: normal, conjunctiva are pink and non-injected, sclera clear OROPHARYNX:no exudate, no erythema and lips, buccal mucosa, and tongue normal  NECK: supple, thyroid normal size, non-tender, without nodularity LYMPH:  no palpable lymphadenopathy in the cervical, axillary or inguinal LUNGS: clear to auscultation and percussion with normal breathing effort HEART: regular rate & rhythm and no murmurs and no lower extremity edema ABDOMEN:abdomen soft, non-tender and normal bowel sounds Musculoskeletal:no cyanosis of digits and no clubbing  PSYCH: alert & oriented x 3 with fluent speech NEURO: no focal motor/sensory deficits  LABORATORY DATA:  I have reviewed the data as listed CBC Latest Ref Rng & Units 05/21/2018 05/09/2018 05/08/2018  WBC 4.0 - 10.5 K/uL 4.5 2.9(L) 2.2(L)  Hemoglobin 12.0 - 15.0 g/dL 13.6 9.4(L) 7.5(L)  Hematocrit 36.0 - 46.0 % 40.6 27.6(L) 21.8(L)  Platelets 150 - 400 K/uL 119(L) 92(L) 95(L)   CMP Latest Ref Rng & Units 05/21/2018 05/09/2018 05/08/2018  Glucose 65 - 99 mg/dL 101(H) 90 86  BUN 6 - 20 mg/dL 42(H) 41(H) 45(H)  Creatinine 0.44 - 1.00 mg/dL 3.36(H) 3.73(H) 3.92(H)  Sodium 135 - 145 mmol/L 129(L) 126(L) 127(L)  Potassium 3.5 - 5.1 mmol/L 5.2(H) 3.8 4.0  Chloride 101 - 111 mmol/L 98(L) 92(L) 93(L)  CO2 22 - 32 mmol/L 20(L) 24 25  Calcium 8.9 - 10.3 mg/dL 9.9 8.9 8.6(L)  Total Protein 6.5 - 8.1 g/dL 8.1 - -  Total Bilirubin 0.3 - 1.2 mg/dL 1.0 - -  Alkaline Phos 38 - 126 U/L 49 - -  AST 15 - 41  U/L 27 - -  ALT 14 - 54 U/L 19 - -   PROCEDURES:   Upper Endoscopy: 12/24/15 ENDOSCOPIC IMPRESSION: There was mild to moderate erosive gastritis (mid and distal stomach). This was not classic appearing for portal gastropathy. Biopsies were taken from antrum and body to check for H. pylori, portal gastropathy, GAVE. The examination was otherwise normal. There were no gastric or esophageal varices  Colonoscopy: 12/24/15 ENDOSCOPIC IMPRESSION:  1. Mild diverticulosis was noted in the left colon 2. The examination was otherwise normal   RADIOGRAPHIC STUDIES: I have personally reviewed the radiological images as listed and agreed with the findings in the report.   MRI Abdomen WO Contrast 10/31/17  IMPRESSION: 1. Stable appearance of the ablation defect in segment VIII of the liver. No new liver abnormality on the current study. 2. Subtle nodularity of hepatic contour suggests cirrhosis. 3. Gallbladder distension with gallstone again noted lodged in the gallbladder neck/cystic duct. No associated biliary dilatation. 4. Stable appearance complex/septated cyst upper pole left kidney on this noncontrast exam. Lesion remains compatible with Bosniak II F lesion as previously characterized.   MRI Abdomen WO Contrast 05/15/17 IMPRESSION: Ablation zone in segment 8 is favored to have mildly decreased in size, now measuring 1.4 cm. This is incompletely evaluated in the absence of intravenous contrast. Otherwise, no focal hepatic lesion is seen. Suspected mild cirrhosis. 4.0 x 3.2 cm complex/septated left upper pole renal cyst, incompletely evaluated, but grossly unchanged.   MM SCREENING BREAST TOMO BILATERAL 04/18/17 IMPRESSION: No mammographic evidence of malignancy. A result letter of this screening mammogram will be mailed directly to the patient.  MRI ABD 09/06/16 IMPRESSION: 1. Minimal further size reduction at the ablation site, no recurrence or new focus of hepatocellular  carcinoma identified. 2. Complex cystic lesion of the left kidney upper pole with some thin internal septations but no measurable enhancement, has  minimally increased in size over the past 9 years. High likelihood that this is a benign lesion, categorized as Bosniak category IIF in the past. I would suggest ancillary surveillance at the time of liver follow up. 3. Mild chronic gallbladder wall thickening, with a stone lodged in the neck of the gallbladder. Mild chronic gallbladder distention.   ASSESSMENT & PLAN:  72 y.o.  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  1. Stage I hepatocellular carcinoma  -I previously reviewed her liver MRI scan images with her and her family members in great detail. The 1.5 cm liver lesion in right lobe has typical arterial enhancement and venous washout, is consistent with HCC, in the background of liver cirrhosis. The other 1.0 centimeter lesion does not have typical arterial enhancement, and is indeterminate.  -Her liver MRI was diagnostic for [facility], she did not need liver biopsy  -She is status-post CT-guided microwave ablation by Dr. Kathlene Cote in May 2016, and had a good response. -Her restaging liver MRI in 08/2016 showed treatment effect, no recurrence or new lesions. -I reviewed her MRI abdomen from 10/29/17 shows no new changes and no evidence of disease progression.  -She is to see Dr. Kathlene Cote this summer. I will send him a message about this  -Labs reviewed, AFP is still pending.  -Continue liver cancer surveillance.  She is 3 years out have her initial ablation, the risk of recurrence has decreased -Plan to repeat abdominal MRI in before next visit. No contrast due to CKD -F/u in 6 months    2. 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  -She should continue to  follow-up with Tally Due, I recommend she see her yearly.   3. CKD, stage V -She was hospitalized for worsening renal function in September 2018.  -we discussed to avoid NSADs, iv contrast etc., due to her CKD   -She had a arteriovenous graph placed in her upper left arm on 05/21/18, for her dialysis. She plans to start soon.  Follow-up with her nephrologist -Cr. At 3.36 today (05/21/18)  4. Hypertension -I strongly encouraged her to monitor her blood pressure at home and follow-up with her primary care physician -BP much better lately, 130/65 today (05/21/18)   PLAN: -F/u in 6 months  -Lab and abdominal MRI wo contrast one week before   Orders Placed This Encounter  Procedures  . MR Abdomen Wo Contrast    Standing Status:   Future    Standing Expiration Date:   05/21/2019    Order Specific Question:   What is the patient's sedation requirement?    Answer:   No Sedation    Order Specific Question:   Does the patient have a pacemaker or implanted devices?    Answer:   No    Order Specific Question:   Preferred imaging location?    Answer:   [facility] (table limit-350 lbs)    Order Specific Question:   Radiology Contrast Protocol - do NOT remove file path    Answer:   [email or web address]    All questions were answered. The patient knows to call the clinic with any problems, questions or concerns. I spent 20 minutes counseling the patient face to face. The total time spent in the appointment was 25 minutes and more than 50% was on counseling.  Oneal Deputy, am acting as Education administrator for [employer]  Burr Medico, MD.   I have reviewed the above documentation for accuracy and completeness, and I agree with the above.      Truitt Merle, MD 05/21/2018

## 2018-05-21 NOTE — Op Note (Signed)
Procedure: Left Upper Arm AV graft  Preop: ESRD  Postop: ESRD  Anesthesia: General  Findings:4-7 mm PTFE end to side to axillary vein  Asst: Matt Eveland PA-C   Procedure Details: After commencing general anesthesia, the left upper extremity was prepped and draped in usual sterile fashion. A longitudinal incision was then made near the antecubital crease the left arm.  There were no suitable antecubital veins for outflow.   The incision was carried into the subcutaneous tissues down to level of the brachial artery.  Next the brachial artery was dissected free in the medial portion incision. The artery was  3 mm in diameter. The vessel loops were placed proximal and distal to the planned site of arteriotomy. At this point, a longitudinal incision was made in the axilla and carried through the subcutaneous tissues and fascia to expose the axillary vein.  The nerves were protected.  The vein was approximately 4-5 mm in diameter. Next, a subcutaneous tunnel was created connecting the upper arm to the lower arm incision in an arcing configuration over the biceps muscle.  A 4-7 mm PTFE graft was then brought through this subcutaneous tunnel. The patient was given 73000 units of intravenous heparin. After appropriate circulation time, the vessel loops were used to control the artery. A longitudinal opening was made in the right brachial artery.  The 4 mm end of the graft was beveled and sewn end to side to the artery using a 6 0 prolene.  At completion of the anastomosis the artery was forward bled, backbled and thoroughly flushed.  The anastomosis was secured, vessel loops were released and there was palpable pulse in the graft.  The graft was clamped just above the arterial anastomosis with a fistula clamp. The graft was then pulled taut to length at the axillary incision.  The axillary vein was controlled with a fine bulldog clamp in the upper axilla proximally and distally.  The vein was opened  longitudinally.  The distal end of the graft was then beveled and sewn end to side to the vein using a running 6 0 prolene.  Just prior to completion of the anastomosis, everything was forward bled, back bled and thoroughly flushed.  The anastomosis was secured and the fistula clamp removed from the proximal graft.  A thrill was immediately palpable in the graft.  After hemostasis was obtained, the subcutaneous tissues were reapproximated using a running 3-0 Vicryl suture. The skin was then closed with a 4 0 Vicryl subcuticular stitch. Dermabond was applied to the skin incisions.  The patient tolerated the procedure well and there were no complications.  Instrument sponge and needle count was correct at the end of the case.  The patient was taken to the recovery room in stable condition.   Ruta Hinds, MD [facility] Office: [phone] Pager: [phone]

## 2018-05-21 NOTE — Discharge Instructions (Signed)
° °  [facility] ° °Discharge Instructions ° °AV Fistula or Graft Surgery for Dialysis Access ° °Please refer to the following instructions for your post-procedure care. Your surgeon or physician assistant will discuss any changes with you. ° °Activity ° °You may drive the day following your surgery, if you are comfortable and no longer taking prescription pain medication. Resume full activity as the soreness in your incision resolves. ° °Bathing/Showering ° °You may shower after you go home. Keep your incision dry for 48 hours. Do not soak in a bathtub, hot tub, or swim until the incision heals completely. You may not shower if you have a hemodialysis catheter. ° °Incision Care ° °Clean your incision with mild soap and water after 48 hours. Pat the area dry with a clean towel. You do not need a bandage unless otherwise instructed. Do not apply any ointments or creams to your incision. You may have skin glue on your incision. Do not peel it off. It will come off on its own in about one week. Your arm may swell a bit after surgery. To reduce swelling use pillows to elevate your arm so it is above your heart. Your doctor will tell you if you need to lightly wrap your arm with an ACE bandage. ° °Diet ° °Resume your normal diet. There are not special food restrictions following this procedure. In order to heal from your surgery, it is CRITICAL to get adequate nutrition. Your body requires vitamins, minerals, and protein. Vegetables are the best source of vitamins and minerals. Vegetables also provide the perfect balance of protein. Processed food has little nutritional value, so try to avoid this. ° °Medications ° °Resume taking all of your medications. If your incision is causing pain, you may take over-the counter pain relievers such as acetaminophen (Tylenol). If you were prescribed a stronger pain medication, please be aware these medications can cause nausea and constipation. Prevent  nausea by taking the medication with a snack or meal. Avoid constipation by drinking plenty of fluids and eating foods with high amount of fiber, such as fruits, vegetables, and grains. Do not take Tylenol if you are taking prescription pain medications. ° ° ° ° °Follow up °Your surgeon may want to see you in the office following your access surgery. If so, this will be arranged at the time of your surgery. ° °Please call us immediately for any of the following conditions: ° °Increased pain, redness, drainage (pus) from your incision site °Fever of 101 degrees or higher °Severe or worsening pain at your incision site °Hand pain or numbness. ° °Reduce your risk of vascular disease: ° °Stop smoking. If you would like help, call [facility] at 1-800-QUIT-NOW (1-800-784-8669) or [facility] at 336-586-4000 ° °Manage your cholesterol °Maintain a desired weight °Control your diabetes °Keep your blood pressure down ° °Dialysis ° °It will take several weeks to several months for your new dialysis access to be ready for use. Your surgeon will determine when it is OK to use it. Your nephrologist will continue to direct your dialysis. You can continue to use your Permcath until your new access is ready for use. ° °If you have any questions, please call the office at [phone]. ° °

## 2018-05-21 NOTE — Transfer of Care (Signed)
Immediate Anesthesia Transfer of Care Note  Patient: Sarah Harmon  Procedure(s) Performed: INSERTION OF ARTERIOVENOUS (AV) GORE-TEX GRAFT UPPER ARM (Left Arm Upper)  Patient Location: PACU  Anesthesia Type:General  Level of Consciousness: awake and oriented  Airway & Oxygen Therapy: Patient Spontanous Breathing  Post-op Assessment: Report given to RN  Post vital signs: Reviewed and stable  Last Vitals:  Vitals Value Taken Time  BP 154/78 05/21/2018  9:25 AM  Temp    Pulse 51 05/21/2018  9:25 AM  Resp 14 05/21/2018  9:25 AM  SpO2 97 % 05/21/2018  9:25 AM  Vitals shown include unvalidated device data.  Last Pain:  Vitals:   05/21/18 0649  TempSrc:   PainSc: 0-No pain         Complications: No apparent anesthesia complications

## 2018-05-21 NOTE — Interval H&P Note (Signed)
History and Physical Interval Note:  05/21/2018 7:25 AM  Sarah Harmon  has presented today for surgery, with the diagnosis of CKD 5  The various methods of treatment have been discussed with the patient and family. After consideration of risks, benefits and other options for treatment, the patient has consented to  Procedure(s): INSERTION OF ARTERIOVENOUS (AV) GORE-TEX GRAFT UPPER ARM (Left) as a surgical intervention .  The patient's history has been reviewed, patient examined, no change in status, stable for surgery.  I have reviewed the patient's chart and labs.  Questions were answered to the patient's satisfaction.     Ruta Hinds

## 2018-05-22 ENCOUNTER — Encounter (HOSPITAL_COMMUNITY): Payer: Self-pay | Admitting: Vascular Surgery

## 2018-05-22 LAB — AFP TUMOR MARKER: AFP, Serum, Tumor Marker: 7.2 ng/mL (ref 0.0–8.3)

## 2018-05-24 ENCOUNTER — Telehealth: Payer: Self-pay | Admitting: Internal Medicine

## 2018-05-24 DIAGNOSIS — C229 Malignant neoplasm of liver, not specified as primary or secondary: Secondary | ICD-10-CM

## 2018-05-24 DIAGNOSIS — N186 End stage renal disease: Secondary | ICD-10-CM

## 2018-05-24 NOTE — Telephone Encounter (Signed)
Copied from [place] [id number]. Topic: Quick Communication - See Telephone Encounter >> May 24, 2018  4:34 PM Rutherford Nail, NT wrote: CRM for notification. See Telephone encounter for: 05/24/18. Pt daughter called in and stated that pt is not eating .  Pt daughter wanted to know about home health?  She would like to know if there is anything or anyone that could come in to help with dietary needs? She was just seen on 6/4   Best number [phone]

## 2018-05-27 NOTE — Telephone Encounter (Signed)
Ok this is done - order was for [facility] with RN and aide

## 2018-05-27 NOTE — Telephone Encounter (Signed)
Please advise   Pt is not eating. Actively seeing oncology. Dtr is requesting HH to help with dietary needs.

## 2018-05-27 NOTE — Telephone Encounter (Signed)
See below

## 2018-05-28 ENCOUNTER — Other Ambulatory Visit (HOSPITAL_COMMUNITY): Payer: Self-pay | Admitting: Interventional Radiology

## 2018-05-28 DIAGNOSIS — C220 Liver cell carcinoma: Secondary | ICD-10-CM

## 2018-05-30 NOTE — Telephone Encounter (Signed)
Referral faxed to [facility]

## 2018-05-31 ENCOUNTER — Other Ambulatory Visit: Payer: Self-pay

## 2018-05-31 ENCOUNTER — Other Ambulatory Visit: Payer: Self-pay | Admitting: *Deleted

## 2018-05-31 ENCOUNTER — Encounter (HOSPITAL_COMMUNITY): Payer: Self-pay | Admitting: *Deleted

## 2018-05-31 DIAGNOSIS — T8249XA Other complication of vascular dialysis catheter, initial encounter: Secondary | ICD-10-CM | POA: Diagnosis not present

## 2018-05-31 DIAGNOSIS — E875 Hyperkalemia: Secondary | ICD-10-CM | POA: Diagnosis not present

## 2018-05-31 DIAGNOSIS — N184 Chronic kidney disease, stage 4 (severe): Secondary | ICD-10-CM | POA: Diagnosis not present

## 2018-05-31 DIAGNOSIS — I129 Hypertensive chronic kidney disease with stage 1 through stage 4 chronic kidney disease, or unspecified chronic kidney disease: Secondary | ICD-10-CM | POA: Diagnosis not present

## 2018-05-31 DIAGNOSIS — D631 Anemia in chronic kidney disease: Secondary | ICD-10-CM | POA: Diagnosis not present

## 2018-05-31 NOTE — Progress Notes (Signed)
SDW-Pre-op call completed by pt daughter [name]. Daughter denies any acute cardiopulmonary issues. Daughter denies that pt is under the care of a cardiologist. Daughter denies that pt had a stress test and cardiac cath. Daughter stated that pt does not take Eliquis; clarify with pt DOS. Daughter made aware that pt should stop taking vitamins, fish oil and herbal medications. Do not take Advil, Motrin, Naproxen( Aleve), Ibuprofen, BC and Goody powder. Daughter verbalized understanding of all pre-op instructions.

## 2018-06-03 ENCOUNTER — Telehealth: Payer: Self-pay | Admitting: Internal Medicine

## 2018-06-03 ENCOUNTER — Ambulatory Visit (HOSPITAL_COMMUNITY): Payer: Medicare Other | Admitting: Anesthesiology

## 2018-06-03 ENCOUNTER — Other Ambulatory Visit: Payer: Self-pay

## 2018-06-03 ENCOUNTER — Ambulatory Visit (HOSPITAL_COMMUNITY)
Admission: RE | Admit: 2018-06-03 | Discharge: 2018-06-03 | Disposition: A | Discharge status: Home / Self Care | Payer: Medicare Other | Source: Ambulatory Visit | Attending: Vascular Surgery | Admitting: Vascular Surgery

## 2018-06-03 ENCOUNTER — Encounter (HOSPITAL_COMMUNITY)
Admission: RE | Disposition: A | Discharge status: Home / Self Care | Payer: Self-pay | Source: Ambulatory Visit | Attending: Vascular Surgery

## 2018-06-03 ENCOUNTER — Encounter (HOSPITAL_COMMUNITY): Payer: Self-pay | Admitting: *Deleted

## 2018-06-03 DIAGNOSIS — Y832 Surgical operation with anastomosis, bypass or graft as the cause of abnormal reaction of the patient, or of later complication, without mention of misadventure at the time of the procedure: Secondary | ICD-10-CM | POA: Insufficient documentation

## 2018-06-03 DIAGNOSIS — I120 Hypertensive chronic kidney disease with stage 5 chronic kidney disease or end stage renal disease: Secondary | ICD-10-CM | POA: Insufficient documentation

## 2018-06-03 DIAGNOSIS — N186 End stage renal disease: Secondary | ICD-10-CM | POA: Diagnosis not present

## 2018-06-03 DIAGNOSIS — K7030 Alcoholic cirrhosis of liver without ascites: Secondary | ICD-10-CM | POA: Insufficient documentation

## 2018-06-03 DIAGNOSIS — Z79899 Other long term (current) drug therapy: Secondary | ICD-10-CM | POA: Insufficient documentation

## 2018-06-03 DIAGNOSIS — F329 Major depressive disorder, single episode, unspecified: Secondary | ICD-10-CM | POA: Diagnosis not present

## 2018-06-03 DIAGNOSIS — Z992 Dependence on renal dialysis: Secondary | ICD-10-CM | POA: Diagnosis not present

## 2018-06-03 DIAGNOSIS — N185 Chronic kidney disease, stage 5: Secondary | ICD-10-CM | POA: Insufficient documentation

## 2018-06-03 DIAGNOSIS — E559 Vitamin D deficiency, unspecified: Secondary | ICD-10-CM | POA: Diagnosis not present

## 2018-06-03 DIAGNOSIS — T82868A Thrombosis of vascular prosthetic devices, implants and grafts, initial encounter: Secondary | ICD-10-CM | POA: Insufficient documentation

## 2018-06-03 DIAGNOSIS — E785 Hyperlipidemia, unspecified: Secondary | ICD-10-CM | POA: Diagnosis not present

## 2018-06-03 DIAGNOSIS — F419 Anxiety disorder, unspecified: Secondary | ICD-10-CM | POA: Insufficient documentation

## 2018-06-03 HISTORY — PX: AV FISTULA PLACEMENT: SHX1204

## 2018-06-03 HISTORY — PX: THROMBECTOMY AND REVISION OF ARTERIOVENTOUS (AV) GORETEX  GRAFT: SHX6120

## 2018-06-03 LAB — PROTIME-INR
INR: 1
PROTHROMBIN TIME: 13.1 s (ref 11.4–15.2)

## 2018-06-03 LAB — POCT I-STAT 4, (NA,K, GLUC, HGB,HCT)
Glucose, Bld: 92 mg/dL (ref 65–99)
HCT: 32 % — ABNORMAL LOW (ref 36.0–46.0)
Hemoglobin: 10.9 g/dL — ABNORMAL LOW (ref 12.0–15.0)
Potassium: 4.5 mmol/L (ref 3.5–5.1)
Sodium: 131 mmol/L — ABNORMAL LOW (ref 135–145)

## 2018-06-03 SURGERY — THROMBECTOMY AND REVISION OF ARTERIOVENTOUS (AV) GORETEX  GRAFT
Anesthesia: General | Site: Arm Upper | Laterality: Left

## 2018-06-03 MED ORDER — OXYCODONE HCL 5 MG PO TABS
5.0000 mg | ORAL_TABLET | Freq: Once | ORAL | Status: AC | PRN
  Administered 2018-06-03: 5 mg via ORAL

## 2018-06-03 MED ORDER — MIDAZOLAM HCL 2 MG/2ML IJ SOLN
INTRAMUSCULAR | Status: AC
  Filled 2018-06-03: qty 2

## 2018-06-03 MED ORDER — HEPARIN SODIUM (PORCINE) 1000 UNIT/ML IJ SOLN
INTRAMUSCULAR | Status: DC | PRN
  Administered 2018-06-03: 3000 [IU] via INTRAVENOUS

## 2018-06-03 MED ORDER — ALBUMIN HUMAN 5 % IV SOLN
INTRAVENOUS | Status: DC | PRN
  Administered 2018-06-03: 14:00:00 via INTRAVENOUS

## 2018-06-03 MED ORDER — ROCURONIUM BROMIDE 50 MG/5ML IV SOLN
INTRAVENOUS | Status: AC
  Filled 2018-06-03: qty 1

## 2018-06-03 MED ORDER — FENTANYL CITRATE (PF) 100 MCG/2ML IJ SOLN
INTRAMUSCULAR | Status: AC
  Filled 2018-06-03: qty 2

## 2018-06-03 MED ORDER — MIDAZOLAM HCL 5 MG/5ML IJ SOLN
INTRAMUSCULAR | Status: DC | PRN
  Administered 2018-06-03: 1 mg via INTRAVENOUS

## 2018-06-03 MED ORDER — ONDANSETRON HCL 4 MG/2ML IJ SOLN
INTRAMUSCULAR | Status: AC
  Filled 2018-06-03: qty 2

## 2018-06-03 MED ORDER — ONDANSETRON HCL 4 MG/2ML IJ SOLN
INTRAMUSCULAR | Status: DC | PRN
  Administered 2018-06-03: 4 mg via INTRAVENOUS

## 2018-06-03 MED ORDER — GLYCOPYRROLATE 0.2 MG/ML IJ SOLN
INTRAMUSCULAR | Status: DC | PRN
  Administered 2018-06-03: 0.2 mg via INTRAVENOUS

## 2018-06-03 MED ORDER — 0.9 % SODIUM CHLORIDE (POUR BTL) OPTIME
TOPICAL | Status: DC | PRN
  Administered 2018-06-03: 1000 mL

## 2018-06-03 MED ORDER — PROTAMINE SULFATE 10 MG/ML IV SOLN
INTRAVENOUS | Status: DC | PRN
  Administered 2018-06-03 (×3): 10 mg via INTRAVENOUS

## 2018-06-03 MED ORDER — EPHEDRINE SULFATE 50 MG/ML IJ SOLN
INTRAMUSCULAR | Status: DC | PRN
  Administered 2018-06-03 (×5): 10 mg via INTRAVENOUS

## 2018-06-03 MED ORDER — HEPARIN SODIUM (PORCINE) 1000 UNIT/ML IJ SOLN
INTRAMUSCULAR | Status: AC
  Filled 2018-06-03: qty 1

## 2018-06-03 MED ORDER — PROPOFOL 10 MG/ML IV BOLUS
INTRAVENOUS | Status: AC
  Filled 2018-06-03: qty 20

## 2018-06-03 MED ORDER — SODIUM CHLORIDE 0.9 % IJ SOLN
INTRAMUSCULAR | Status: AC
  Filled 2018-06-03: qty 10

## 2018-06-03 MED ORDER — HYDROCODONE-ACETAMINOPHEN 5-325 MG PO TABS: 1.0000 | ORAL_TABLET | Freq: Four times a day (QID) | ORAL | 0 refills | Status: DC | PRN

## 2018-06-03 MED ORDER — HYDRALAZINE HCL 20 MG/ML IJ SOLN
10.0000 mg | Freq: Once | INTRAMUSCULAR | Status: AC
  Administered 2018-06-03: 10 mg via INTRAVENOUS
  Filled 2018-06-03: qty 0.5

## 2018-06-03 MED ORDER — HYDRALAZINE HCL 20 MG/ML IJ SOLN
INTRAMUSCULAR | Status: AC
  Administered 2018-06-03: 10 mg via INTRAVENOUS
  Filled 2018-06-03: qty 1

## 2018-06-03 MED ORDER — LIDOCAINE 2% (20 MG/ML) 5 ML SYRINGE
INTRAMUSCULAR | Status: AC
  Filled 2018-06-03: qty 5

## 2018-06-03 MED ORDER — FENTANYL CITRATE (PF) 250 MCG/5ML IJ SOLN
INTRAMUSCULAR | Status: AC
  Filled 2018-06-03: qty 5

## 2018-06-03 MED ORDER — DEXAMETHASONE SODIUM PHOSPHATE 10 MG/ML IJ SOLN
INTRAMUSCULAR | Status: AC
  Filled 2018-06-03: qty 1

## 2018-06-03 MED ORDER — PROPOFOL 10 MG/ML IV BOLUS
INTRAVENOUS | Status: DC | PRN
  Administered 2018-06-03: 150 mg via INTRAVENOUS

## 2018-06-03 MED ORDER — EPHEDRINE SULFATE 50 MG/ML IJ SOLN
INTRAMUSCULAR | Status: AC
  Filled 2018-06-03: qty 1

## 2018-06-03 MED ORDER — DEXAMETHASONE SODIUM PHOSPHATE 10 MG/ML IJ SOLN
INTRAMUSCULAR | Status: DC | PRN
  Administered 2018-06-03: 4 mg via INTRAVENOUS

## 2018-06-03 MED ORDER — PHENYLEPHRINE HCL 10 MG/ML IJ SOLN
INTRAVENOUS | Status: DC | PRN
  Administered 2018-06-03: 50 ug/min via INTRAVENOUS

## 2018-06-03 MED ORDER — FENTANYL CITRATE (PF) 100 MCG/2ML IJ SOLN
INTRAMUSCULAR | Status: DC | PRN
  Administered 2018-06-03: 50 ug via INTRAVENOUS

## 2018-06-03 MED ORDER — GLYCOPYRROLATE PF 0.2 MG/ML IJ SOSY
PREFILLED_SYRINGE | INTRAMUSCULAR | Status: AC
  Filled 2018-06-03: qty 1

## 2018-06-03 MED ORDER — HEMOSTATIC AGENTS (NO CHARGE) OPTIME
TOPICAL | Status: DC | PRN
  Administered 2018-06-03: 1 via TOPICAL

## 2018-06-03 MED ORDER — CEFAZOLIN SODIUM-DEXTROSE 2-4 GM/100ML-% IV SOLN
2.0000 g | INTRAVENOUS | Status: AC
  Administered 2018-06-03: 2 g via INTRAVENOUS

## 2018-06-03 MED ORDER — OXYCODONE HCL 5 MG PO TABS
ORAL_TABLET | ORAL | Status: AC
  Filled 2018-06-03: qty 1

## 2018-06-03 MED ORDER — ONDANSETRON HCL 4 MG/2ML IJ SOLN: 4.0000 mg | Freq: Four times a day (QID) | INTRAMUSCULAR | Status: DC | PRN

## 2018-06-03 MED ORDER — SODIUM CHLORIDE 0.9 % IV SOLN
INTRAVENOUS | Status: DC
  Administered 2018-06-03 (×3): via INTRAVENOUS

## 2018-06-03 MED ORDER — SODIUM CHLORIDE 0.9 % IV SOLN
INTRAVENOUS | Status: DC | PRN
  Administered 2018-06-03: 14:00:00

## 2018-06-03 MED ORDER — CEFAZOLIN SODIUM-DEXTROSE 2-4 GM/100ML-% IV SOLN
INTRAVENOUS | Status: AC
  Filled 2018-06-03: qty 100

## 2018-06-03 MED ORDER — LIDOCAINE HCL (CARDIAC) PF 100 MG/5ML IV SOSY
PREFILLED_SYRINGE | INTRAVENOUS | Status: DC | PRN
  Administered 2018-06-03: 60 mg via INTRAVENOUS

## 2018-06-03 MED ORDER — FENTANYL CITRATE (PF) 100 MCG/2ML IJ SOLN
25.0000 ug | INTRAMUSCULAR | Status: DC | PRN
  Administered 2018-06-03: 25 ug via INTRAVENOUS

## 2018-06-03 MED ORDER — OXYCODONE HCL 5 MG/5ML PO SOLN: 5.0000 mg | Freq: Once | ORAL | Status: AC | PRN

## 2018-06-03 MED ORDER — PHENYLEPHRINE HCL 10 MG/ML IJ SOLN
INTRAMUSCULAR | Status: DC | PRN
  Administered 2018-06-03: 200 ug via INTRAVENOUS
  Administered 2018-06-03: 120 ug via INTRAVENOUS
  Administered 2018-06-03: 80 ug via INTRAVENOUS

## 2018-06-03 SURGICAL SUPPLY — 50 items
ADH SKN CLS APL DERMABOND .7 (GAUZE/BANDAGES/DRESSINGS) ×1
AGENT HMST SPONGE THK3/8 (HEMOSTASIS) ×1
ARMBAND PINK RESTRICT EXTREMIT (MISCELLANEOUS) ×3 IMPLANT
CANISTER SUCT 3000ML PPV (MISCELLANEOUS) ×2 IMPLANT
CANNULA VESSEL 3MM 2 BLNT TIP (CANNULA) IMPLANT
CATH EMB 3FR 40CM (CATHETERS) ×1 IMPLANT
CATH EMB 3FR 80CM (CATHETERS) ×2 IMPLANT
CATH EMB 4FR 80CM (CATHETERS) ×4 IMPLANT
CLIP VESOCCLUDE MED 6/CT (CLIP) ×2 IMPLANT
CLIP VESOCCLUDE SM WIDE 6/CT (CLIP) ×2 IMPLANT
DECANTER SPIKE VIAL GLASS SM (MISCELLANEOUS) ×1 IMPLANT
DERMABOND ADVANCED (GAUZE/BANDAGES/DRESSINGS) ×1
DERMABOND ADVANCED .7 DNX12 (GAUZE/BANDAGES/DRESSINGS) ×1 IMPLANT
DRAPE X-RAY CASS 24X20 (DRAPES) IMPLANT
DRSG COVADERM 4X6 (GAUZE/BANDAGES/DRESSINGS) ×1 IMPLANT
ELECT REM PT RETURN 9FT ADLT (ELECTROSURGICAL) ×2
ELECTRODE REM PT RTRN 9FT ADLT (ELECTROSURGICAL) ×1 IMPLANT
GLOVE BIO SURGEON STRL SZ7 (GLOVE) ×1 IMPLANT
GLOVE BIO SURGEON STRL SZ7.5 (GLOVE) ×2 IMPLANT
GLOVE BIOGEL PI IND STRL 6.5 (GLOVE) IMPLANT
GLOVE BIOGEL PI IND STRL 7.0 (GLOVE) IMPLANT
GLOVE BIOGEL PI IND STRL 7.5 (GLOVE) IMPLANT
GLOVE BIOGEL PI INDICATOR 6.5 (GLOVE) ×2
GLOVE BIOGEL PI INDICATOR 7.0 (GLOVE) ×1
GLOVE BIOGEL PI INDICATOR 7.5 (GLOVE) ×1
GLOVE ECLIPSE 7.0 STRL STRAW (GLOVE) ×1 IMPLANT
GOWN STRL REUS W/ TWL LRG LVL3 (GOWN DISPOSABLE) ×3 IMPLANT
GOWN STRL REUS W/ TWL XL LVL3 (GOWN DISPOSABLE) IMPLANT
GOWN STRL REUS W/TWL LRG LVL3 (GOWN DISPOSABLE) ×6
GOWN STRL REUS W/TWL XL LVL3 (GOWN DISPOSABLE) ×2
GRAFT GORETEX STRT 7X10 (Vascular Products) ×1 IMPLANT
HEMOSTAT SPONGE AVITENE ULTRA (HEMOSTASIS) ×1 IMPLANT
KIT BASIN OR (CUSTOM PROCEDURE TRAY) ×2 IMPLANT
KIT TURNOVER KIT B (KITS) ×2 IMPLANT
LOOP VESSEL MAXI BLUE (MISCELLANEOUS) ×1 IMPLANT
LOOP VESSEL MINI RED (MISCELLANEOUS) IMPLANT
NS IRRIG 1000ML POUR BTL (IV SOLUTION) ×2 IMPLANT
PACK CV ACCESS (CUSTOM PROCEDURE TRAY) ×2 IMPLANT
PAD ARMBOARD 7.5X6 YLW CONV (MISCELLANEOUS) ×4 IMPLANT
SET COLLECT BLD 21X3/4 12 (NEEDLE) IMPLANT
STOPCOCK 4 WAY LG BORE MALE ST (IV SETS) IMPLANT
SUT PROLENE 6 0 CC (SUTURE) ×6 IMPLANT
SUT VIC AB 3-0 SH 27 (SUTURE) ×2
SUT VIC AB 3-0 SH 27X BRD (SUTURE) ×1 IMPLANT
SUT VICRYL 4-0 PS2 18IN ABS (SUTURE) ×2 IMPLANT
SYR 3ML LL SCALE MARK (SYRINGE) ×1 IMPLANT
TOWEL GREEN STERILE (TOWEL DISPOSABLE) ×2 IMPLANT
TUBING EXTENTION W/L.L. (IV SETS) IMPLANT
UNDERPAD 30X30 (UNDERPADS AND DIAPERS) ×2 IMPLANT
WATER STERILE IRR 1000ML POUR (IV SOLUTION) ×2 IMPLANT

## 2018-06-03 NOTE — Progress Notes (Signed)
Spoke with Dr. Marcie Bal about patients blood pressure.  Received verbal order for 10mg  of Hydralazine.  Will continue to monitor pressure

## 2018-06-03 NOTE — Anesthesia Preprocedure Evaluation (Addendum)
Anesthesia Evaluation  Patient identified by MRN, date of birth, ID band Patient awake    Reviewed: Allergy & Precautions, H&P , NPO status , Patient's Chart, lab work & pertinent test results  Airway Mallampati: II  TM Distance: >3 FB Neck ROM: full    Dental  (+) Edentulous Upper, Edentulous Lower, Dental Advisory Given   Pulmonary neg pulmonary ROS,    breath sounds clear to auscultation       Cardiovascular hypertension, Pt. on medications + dysrhythmias Atrial Fibrillation  Rhythm:irregular Rate:Normal     Neuro/Psych  Headaches, PSYCHIATRIC DISORDERS Anxiety Depression    GI/Hepatic (+) Cirrhosis     substance abuse  alcohol use, Hepatocellular CA s/p ablation   Endo/Other    Renal/GU ESRFRenal disease     Musculoskeletal  (+) Arthritis ,   Abdominal   Peds  Hematology  (+) anemia ,   Anesthesia Other Findings   Reproductive/Obstetrics                          Anesthesia Physical Anesthesia Plan  ASA: III  Anesthesia Plan: General   Post-op Pain Management:    Induction: Intravenous  PONV Risk Score and Plan: 3 and Ondansetron, Dexamethasone and Midazolam  Airway Management Planned: LMA  Additional Equipment:   Intra-op Plan:   Post-operative Plan:   Informed Consent: I have reviewed the patients History and Physical, chart, labs and discussed the procedure including the risks, benefits and alternatives for the proposed anesthesia with the patient or authorized representative who has indicated his/her understanding and acceptance.     Plan Discussed with: CRNA, Anesthesiologist and Surgeon  Anesthesia Plan Comments:        Anesthesia Quick Evaluation

## 2018-06-03 NOTE — Interval H&P Note (Signed)
History and Physical Interval Note:  06/03/2018 1:08 PM  Sarah Harmon  has presented today for surgery, with the diagnosis of COMPLICATION OF GRAFT  The various methods of treatment have been discussed with the patient and family. After consideration of risks, benefits and other options for treatment, the patient has consented to  Procedure(s): THROMBECTOMY AND REVISION OF ARTERIOVENTOUS (AV) GORETEX  GRAFT ARM (Left) as a surgical intervention .  The patient's history has been reviewed, patient examined, no change in status, stable for surgery.  I have reviewed the patient's chart and labs.  Questions were answered to the patient's satisfaction.     Ruta Hinds

## 2018-06-03 NOTE — Telephone Encounter (Signed)
Copied from [place] [id number]. Topic: Quick Communication - See Telephone Encounter >> Jun 03, 2018 12:05 PM Hewitt Shorts wrote: Angie from [facility] is calling to get verbal orders  for skilled nursing 1 x 1 week, then 2x 2 weeks and for home health  aide 2x 2 weeks  Best number for angie :  [phone]

## 2018-06-03 NOTE — Op Note (Signed)
Procedure: Thrombectomy and revision of left upper arm AV graft  Preoperative diagnosis: Thrombosed AV graft left arm  Postoperative diagnosis: Same  Anesthesia: General  Assistant: Laurence Slate PA-C  Operative findings: 4-5 mm outflow vein, revised to 7 mm interposition graft end to end  Operative details: After obtaining informed consent, the patient was taken to the operating room. The patient was placed in supine position on the operating room table. After commencing general anesthesia, the patient's entire left upper extremity was prepped and draped in the usual sterile fashion.  A longitudinal incision was made in axillary location through a pre-existing scar. The incision was carried into the subcutaneous tissues down to level the graft. The graft was dissected free circumferentially. Dissection was carried down to the level of the venous anastomosis. The vein was 4-5 mm in diameter. This was dissected free circumferentially. The patient was given 3,000 units of intravenous heparin. A transverse graftotomy was made just above the level of the anastomosis. A #4 Fogarty catheter was used to thrombectomize the venous limb of the graft. There was good backbleeding I was able to pass a  3 4 5  coronary dilatory easily. The arterial limb the graft was then thrombectomized with a #3 and 4 Fogarty catheter excellent arterial inflow was obtained. This was clamped proximally with a fistula clamp. Since I could not find any narrowing and this was a fairly recent graft I decided to revise it from end to side to end to end on the vein. Therefore the vein was opened longitudinally more distally and the vein  In the lower axilla ligated with a 2 0 silk.    A new 7 mm interposition graft was brought up in the operative field and sewn end-to-end to the vein using a running 6-0 Prolene suture. At completion of the anastomosis the venous limb was thoroughly flushed with heparinized saline and reoccluded. The graft  was cut to length in preparation for sewing to the proximal graft. This was then sewn end-to-end to the proximal arterial limb of the graft using running 6-0 Prolene suture. Just prior to completion, the anastomosis was forebled backbled and thoroughly flushed. The anastomosis was secured; clamps were released; and there was a palpable thrill in the graft immediately. Hemostasis was obtained with direct pressure avitene and the assistance of 30 mg of protamine. The subcutaneous tissues were reapproximated with running 3-0 Vicryl suture. The skin was closed with a 4 0 Vicryl subcuticular stitch. Dermabond was applied the incision. The patient tolerated the procedure well and there were no complications. Instrument sponge and needle counts were correct at the end of the case. The patient was taken to the recovery room in stable condition.  Ruta Hinds, MD [facility] Office: [phone] Pager: [phone]

## 2018-06-03 NOTE — Progress Notes (Signed)
Spoke to Dr. Marcie Bal about patients blood pressure.  Pressure is still elevated.  Received order to give 10mg  more of hydralazine

## 2018-06-03 NOTE — Anesthesia Procedure Notes (Signed)
Procedure Name: LMA Insertion Date/Time: 06/03/2018 1:30 PM Performed by: Jenne Campus, CRNA Pre-anesthesia Checklist: Patient identified, Emergency Drugs available, Suction available and Patient being monitored Patient Re-evaluated:Patient Re-evaluated prior to induction Oxygen Delivery Method: Circle System Utilized Preoxygenation: Pre-oxygenation with 100% oxygen Induction Type: IV induction Ventilation: Mask ventilation without difficulty LMA: LMA inserted LMA Size: 4.0 Number of attempts: 1 Placement Confirmation: positive ETCO2 and breath sounds checked- equal and bilateral Tube secured with: Tape Dental Injury: Teeth and Oropharynx as per pre-operative assessment

## 2018-06-03 NOTE — Progress Notes (Signed)
Spoke with Dr. Marcie Bal this morning about patients blood pressure.  We will continue to monitor it with no further orders at this time

## 2018-06-03 NOTE — Discharge Instructions (Signed)
° °  [facility] ° °Discharge Instructions ° °AV Fistula or Graft Surgery for Dialysis Access ° °Please refer to the following instructions for your post-procedure care. Your surgeon or physician assistant will discuss any changes with you. ° °Activity ° °You may drive the day following your surgery, if you are comfortable and no longer taking prescription pain medication. Resume full activity as the soreness in your incision resolves. ° °Bathing/Showering ° °You may shower after you go home. Keep your incision dry for 48 hours. Do not soak in a bathtub, hot tub, or swim until the incision heals completely. You may not shower if you have a hemodialysis catheter. ° °Incision Care ° °Clean your incision with mild soap and water after 48 hours. Pat the area dry with a clean towel. You do not need a bandage unless otherwise instructed. Do not apply any ointments or creams to your incision. You may have skin glue on your incision. Do not peel it off. It will come off on its own in about one week. Your arm may swell a bit after surgery. To reduce swelling use pillows to elevate your arm so it is above your heart. Your doctor will tell you if you need to lightly wrap your arm with an ACE bandage. ° °Diet ° °Resume your normal diet. There are not special food restrictions following this procedure. In order to heal from your surgery, it is CRITICAL to get adequate nutrition. Your body requires vitamins, minerals, and protein. Vegetables are the best source of vitamins and minerals. Vegetables also provide the perfect balance of protein. Processed food has little nutritional value, so try to avoid this. ° °Medications ° °Resume taking all of your medications. If your incision is causing pain, you may take over-the counter pain relievers such as acetaminophen (Tylenol). If you were prescribed a stronger pain medication, please be aware these medications can cause nausea and constipation. Prevent  nausea by taking the medication with a snack or meal. Avoid constipation by drinking plenty of fluids and eating foods with high amount of fiber, such as fruits, vegetables, and grains. Do not take Tylenol if you are taking prescription pain medications. ° ° ° ° °Follow up °Your surgeon may want to see you in the office following your access surgery. If so, this will be arranged at the time of your surgery. ° °Please call us immediately for any of the following conditions: ° °Increased pain, redness, drainage (pus) from your incision site °Fever of 101 degrees or higher °Severe or worsening pain at your incision site °Hand pain or numbness. ° °Reduce your risk of vascular disease: ° °Stop smoking. If you would like help, call [facility] at 1-800-QUIT-NOW (1-800-784-8669) or [place] at 336-586-4000 ° °Manage your cholesterol °Maintain a desired weight °Control your diabetes °Keep your blood pressure down ° °Dialysis ° °It will take several weeks to several months for your new dialysis access to be ready for use. Your surgeon will determine when it is OK to use it. Your nephrologist will continue to direct your dialysis. You can continue to use your Permcath until your new access is ready for use. ° °If you have any questions, please call the office at [phone]. ° °

## 2018-06-03 NOTE — Transfer of Care (Signed)
Immediate Anesthesia Transfer of Care Note  Patient: Sarah Harmon  Procedure(s) Performed: THROMBECTOMY AND REVISION OF ARTERIOVENTOUS (AV) GORETEX  GRAFT ARM (Left Arm Upper) INSERTION OF ARTERIOVENOUS (AV) GORE-TEX 77mm x 10cm GRAFT INTO LEFT ARM (Left Arm Upper)  Patient Location: PACU  Anesthesia Type:General  Level of Consciousness: awake, oriented and patient cooperative  Airway & Oxygen Therapy: Patient Spontanous Breathing and Patient connected to face mask oxygen  Post-op Assessment: Report given to RN and Post -op Vital signs reviewed and stable  Post vital signs: Reviewed  Last Vitals:  Vitals Value Taken Time  BP 151/65 06/03/2018  3:32 PM  Temp    Pulse 57 06/03/2018  3:35 PM  Resp 16 06/03/2018  3:35 PM  SpO2 100 % 06/03/2018  3:35 PM  Vitals shown include unvalidated device data.  Last Pain:  Vitals:   06/03/18 0948  TempSrc:   PainSc: 0-No pain      Patients Stated Pain Goal: 3 (68/03/21 2248)  Complications: No apparent anesthesia complications

## 2018-06-03 NOTE — Telephone Encounter (Signed)
Verbal orders given via Angie VM.

## 2018-06-04 ENCOUNTER — Encounter (HOSPITAL_COMMUNITY): Payer: Self-pay | Admitting: Vascular Surgery

## 2018-06-04 ENCOUNTER — Telehealth: Payer: Self-pay | Admitting: Vascular Surgery

## 2018-06-04 NOTE — Telephone Encounter (Signed)
sch appt lvm 06/27/18 2pm p/o PA

## 2018-06-04 NOTE — Anesthesia Postprocedure Evaluation (Signed)
Anesthesia Post Note  Patient: Sarah Harmon  Procedure(s) Performed: THROMBECTOMY AND REVISION OF ARTERIOVENTOUS (AV) GORETEX  GRAFT ARM (Left Arm Upper) INSERTION OF ARTERIOVENOUS (AV) GORE-TEX 79mm x 10cm GRAFT INTO LEFT ARM (Left Arm Upper)     Patient location during evaluation: PACU Anesthesia Type: General Level of consciousness: awake and alert Pain management: pain level controlled Vital Signs Assessment: post-procedure vital signs reviewed and stable Respiratory status: spontaneous breathing, nonlabored ventilation, respiratory function stable and patient connected to nasal cannula oxygen Cardiovascular status: blood pressure returned to baseline and stable Postop Assessment: no apparent nausea or vomiting Anesthetic complications: no    Last Vitals:  Vitals:   06/03/18 1602 06/03/18 1639  BP: 121/63 (!) 109/58  Pulse: (!) 50 (!) 58  Resp: 20 14  Temp: (!) 36.3 C (!) 36.3 C  SpO2: 95% 93%    Last Pain:  Vitals:   06/03/18 1602  TempSrc:   PainSc: 2                  Eraina Winnie S

## 2018-06-05 ENCOUNTER — Telehealth: Payer: Self-pay | Admitting: Internal Medicine

## 2018-06-05 NOTE — Telephone Encounter (Signed)
Called Sheila, LVM with verbal orders

## 2018-06-05 NOTE — Telephone Encounter (Signed)
Ok for verbal 

## 2018-06-05 NOTE — Telephone Encounter (Signed)
Copied from [place] [id number]. Topic: General - Other >> Jun 05, 2018 11:04 AM Margot Ables wrote: Reason for CRM: requesting order to d/c [facility] aide that was assisting with ADL/bath. Pt states she does not need help bathing. Please call with VO to d/c.

## 2018-06-17 DIAGNOSIS — F1010 Alcohol abuse, uncomplicated: Secondary | ICD-10-CM | POA: Diagnosis not present

## 2018-06-17 DIAGNOSIS — Z992 Dependence on renal dialysis: Secondary | ICD-10-CM | POA: Diagnosis not present

## 2018-06-17 DIAGNOSIS — F419 Anxiety disorder, unspecified: Secondary | ICD-10-CM | POA: Diagnosis not present

## 2018-06-17 DIAGNOSIS — F631 Pyromania: Secondary | ICD-10-CM | POA: Diagnosis not present

## 2018-06-17 DIAGNOSIS — K7030 Alcoholic cirrhosis of liver without ascites: Secondary | ICD-10-CM | POA: Diagnosis not present

## 2018-06-17 DIAGNOSIS — M810 Age-related osteoporosis without current pathological fracture: Secondary | ICD-10-CM | POA: Diagnosis not present

## 2018-06-17 DIAGNOSIS — R634 Abnormal weight loss: Secondary | ICD-10-CM | POA: Diagnosis not present

## 2018-06-17 DIAGNOSIS — N186 End stage renal disease: Secondary | ICD-10-CM

## 2018-06-17 DIAGNOSIS — I120 Hypertensive chronic kidney disease with stage 5 chronic kidney disease or end stage renal disease: Secondary | ICD-10-CM | POA: Diagnosis not present

## 2018-06-17 DIAGNOSIS — C220 Liver cell carcinoma: Secondary | ICD-10-CM | POA: Diagnosis not present

## 2018-06-25 ENCOUNTER — Ambulatory Visit: Payer: Medicare Other | Admitting: Internal Medicine

## 2018-06-25 ENCOUNTER — Other Ambulatory Visit: Payer: Self-pay | Admitting: Internal Medicine

## 2018-06-25 NOTE — Telephone Encounter (Signed)
05/21/2018  30# 

## 2018-06-25 NOTE — Telephone Encounter (Signed)
Done erx 

## 2018-06-27 ENCOUNTER — Ambulatory Visit (INDEPENDENT_AMBULATORY_CARE_PROVIDER_SITE_OTHER): Payer: Self-pay | Admitting: Physician Assistant

## 2018-06-27 VITALS — BP 171/68 | HR 53 | Resp 16 | Ht 64.0 in | Wt 110.0 lb

## 2018-06-27 DIAGNOSIS — N185 Chronic kidney disease, stage 5: Secondary | ICD-10-CM

## 2018-06-27 NOTE — Progress Notes (Signed)
POST OPERATIVE OFFICE NOTE    CC:  F/u for surgery  HPI:  This is a 72 y.o. female who is s/p thrombectomy and revision of left upper arm AV graft on 06/03/18 by Dr. Oneida Alar.   The graft was originally placed on 05/21/18 by Dr. Oneida Alar.  She is not yet on dialysis.  Her nephrologist is Dr. Moshe Cipro and she has an appointment with her in the next few weeks.    She states she does not have any numbness in her hand but does have some tingling.  She is able to use her left hand without difficulty.    No Known Allergies  Current Outpatient Medications  Medication Sig Dispense Refill  . ALPRAZolam (XANAX) 0.5 MG tablet TAKE 1 TABLET BY MOUTH ONCE DAILY AT BEDTIME 30 tablet 2  . amLODipine (NORVASC) 10 MG tablet TAKE 1 TABLET BY MOUTH ONCE DAILY 90 tablet 2  . chlorhexidine (PERIDEX) 0.12 % solution See admin instructions.  1  . Cholecalciferol (VITAMIN D3) 1000 UNITS CAPS Take 10,000 Units by mouth daily.     Marland Kitchen ENSURE (ENSURE) Take 237 mLs by mouth 2 (two) times daily.    . folic acid (FOLVITE) 1 MG tablet TAKE 1 TABLET BY MOUTH ONCE DAILY 90 tablet 0  . hydrALAZINE (APRESOLINE) 100 MG tablet TAKE 1 TABLET BY MOUTH THREE TIMES DAILY 270 tablet 0  . isosorbide dinitrate (ISORDIL) 30 MG tablet Take 1 tablet (30 mg total) by mouth 3 (three) times daily. 90 tablet 0  . ketotifen (ZADITOR) 0.025 % ophthalmic solution Place 1 drop into both eyes 2 (two) times daily.    Marland Kitchen lactulose (CHRONULAC) 10 GM/15ML solution Take 30 mLs (20 g total) by mouth daily. 473 mL 3  . levothyroxine (SYNTHROID, LEVOTHROID) 50 MCG tablet Take 50 mcg by mouth daily before breakfast.    . Nutritional Supplements (FEEDING SUPPLEMENT, NEPRO CARB STEADY,) LIQD Take 237 mLs by mouth 2 (two) times daily between meals. 60 Can 0  . propranolol (INDERAL) 40 MG tablet Take 1 tablet (40 mg total) by mouth 2 (two) times daily. 180 tablet 3  . Propylene Glycol (SYSTANE BALANCE) 0.6 % SOLN Place 1 drop into both eyes daily.    Marland Kitchen  apixaban (ELIQUIS) 2.5 MG TABS tablet Take 2.5 mg by mouth 2 (two) times daily.    . citalopram (CELEXA) 20 MG tablet TAKE ONE-HALF TABLET BY MOUTH ONCE DAILY (Patient not taking: Reported on 06/27/2018) 45 tablet 3  . cloNIDine (CATAPRES) 0.1 MG tablet Take 1 tablet (0.1 mg total) by mouth daily. (Patient not taking: Reported on 05/21/2018) 60 tablet 1  . furosemide (LASIX) 40 MG tablet Take 1 tablet (40 mg total) by mouth daily. (Patient not taking: Reported on 05/21/2018) 30 tablet 0  . HYDROcodone-acetaminophen (NORCO) 5-325 MG tablet Take 1 tablet by mouth every 6 (six) hours as needed for moderate pain. (Patient not taking: Reported on 06/27/2018) 10 tablet 0   No current facility-administered medications for this visit.      ROS:  See HPI  Physical Exam:  Vitals:   06/27/18 1410  BP: (!) 168/65  Pulse: (!) 53  Resp: 16  SpO2: 100%    Incision:  Well healed.  Antecubital space has spitting stitches Extremities:  Easily palpable thrill within the graft.  Her radial pulse is not palpable.  Motor and sensation are in tact left hand.    Assessment/Plan:  This is a 72 y.o. female who is s/p:  thrombectomy and revision of  left upper arm AV graft on 06/03/18 by Dr. Oneida Alar.   The graft was originally placed on 05/21/18 by Dr. Oneida Alar and presents today for follow up.  -pt is doing well.  Her graft remains patent with a good thrill.   -she does have some tingling in her fingers but motor and sensation are in tact.   -discussed with pt and her daughter to check graft everyday.  If they do not feel the thrill, contact her nephrologist.   -the graft is ready to use when needed. -there is two spitting stitches in the incision at the antecubital space-pt wants to let these resolve on their own.   -she wlll f/u with [place] as needed.    Leontine Locket, PA-C [facility] [phone]  Clinic MD:  Oneida Alar

## 2018-06-27 NOTE — Progress Notes (Signed)
Vitals:   06/27/18 1410  BP: (!) 168/65  Pulse: (!) 53  Resp: 16  SpO2: 100%  Weight: 110 lb (49.9 kg)  Height: 5\' 4"  (1.626 m)

## 2018-07-02 ENCOUNTER — Other Ambulatory Visit (HOSPITAL_COMMUNITY): Payer: Self-pay | Admitting: Interventional Radiology

## 2018-07-02 DIAGNOSIS — C220 Liver cell carcinoma: Secondary | ICD-10-CM

## 2018-07-05 DIAGNOSIS — I129 Hypertensive chronic kidney disease with stage 1 through stage 4 chronic kidney disease, or unspecified chronic kidney disease: Secondary | ICD-10-CM | POA: Diagnosis not present

## 2018-07-05 DIAGNOSIS — D631 Anemia in chronic kidney disease: Secondary | ICD-10-CM | POA: Diagnosis not present

## 2018-07-05 DIAGNOSIS — N184 Chronic kidney disease, stage 4 (severe): Secondary | ICD-10-CM | POA: Diagnosis not present

## 2018-07-05 DIAGNOSIS — E875 Hyperkalemia: Secondary | ICD-10-CM | POA: Diagnosis not present

## 2018-07-05 DIAGNOSIS — T8249XA Other complication of vascular dialysis catheter, initial encounter: Secondary | ICD-10-CM | POA: Diagnosis not present

## 2018-07-08 ENCOUNTER — Other Ambulatory Visit: Payer: Self-pay | Admitting: Internal Medicine

## 2018-07-08 DIAGNOSIS — Z1231 Encounter for screening mammogram for malignant neoplasm of breast: Secondary | ICD-10-CM

## 2018-07-13 ENCOUNTER — Inpatient Hospital Stay (HOSPITAL_COMMUNITY)
Admission: EM | Admit: 2018-07-13 | Discharge: 2018-07-23 | DRG: 640 | Disposition: A | Discharge status: Home Health | Payer: Medicare Other | Attending: Internal Medicine | Admitting: Internal Medicine

## 2018-07-13 ENCOUNTER — Emergency Department (HOSPITAL_COMMUNITY): Payer: Medicare Other

## 2018-07-13 ENCOUNTER — Encounter (HOSPITAL_COMMUNITY): Payer: Self-pay | Admitting: Emergency Medicine

## 2018-07-13 ENCOUNTER — Other Ambulatory Visit: Payer: Self-pay

## 2018-07-13 DIAGNOSIS — I160 Hypertensive urgency: Secondary | ICD-10-CM | POA: Diagnosis present

## 2018-07-13 DIAGNOSIS — F32A Depression, unspecified: Secondary | ICD-10-CM | POA: Diagnosis present

## 2018-07-13 DIAGNOSIS — D619 Aplastic anemia, unspecified: Secondary | ICD-10-CM | POA: Diagnosis present

## 2018-07-13 DIAGNOSIS — R531 Weakness: Secondary | ICD-10-CM | POA: Diagnosis not present

## 2018-07-13 DIAGNOSIS — N186 End stage renal disease: Secondary | ICD-10-CM | POA: Diagnosis not present

## 2018-07-13 DIAGNOSIS — R0603 Acute respiratory distress: Secondary | ICD-10-CM

## 2018-07-13 DIAGNOSIS — F329 Major depressive disorder, single episode, unspecified: Secondary | ICD-10-CM | POA: Diagnosis present

## 2018-07-13 DIAGNOSIS — J810 Acute pulmonary edema: Secondary | ICD-10-CM | POA: Diagnosis present

## 2018-07-13 DIAGNOSIS — Z90710 Acquired absence of both cervix and uterus: Secondary | ICD-10-CM

## 2018-07-13 DIAGNOSIS — Z79899 Other long term (current) drug therapy: Secondary | ICD-10-CM

## 2018-07-13 DIAGNOSIS — I161 Hypertensive emergency: Secondary | ICD-10-CM | POA: Diagnosis not present

## 2018-07-13 DIAGNOSIS — E440 Moderate protein-calorie malnutrition: Secondary | ICD-10-CM | POA: Diagnosis not present

## 2018-07-13 DIAGNOSIS — R079 Chest pain, unspecified: Secondary | ICD-10-CM | POA: Diagnosis not present

## 2018-07-13 DIAGNOSIS — I120 Hypertensive chronic kidney disease with stage 5 chronic kidney disease or end stage renal disease: Secondary | ICD-10-CM | POA: Diagnosis present

## 2018-07-13 DIAGNOSIS — C220 Liver cell carcinoma: Secondary | ICD-10-CM | POA: Diagnosis present

## 2018-07-13 DIAGNOSIS — R0602 Shortness of breath: Secondary | ICD-10-CM

## 2018-07-13 DIAGNOSIS — I10 Essential (primary) hypertension: Secondary | ICD-10-CM | POA: Diagnosis present

## 2018-07-13 DIAGNOSIS — J9601 Acute respiratory failure with hypoxia: Secondary | ICD-10-CM | POA: Diagnosis not present

## 2018-07-13 DIAGNOSIS — E871 Hypo-osmolality and hyponatremia: Secondary | ICD-10-CM | POA: Diagnosis present

## 2018-07-13 DIAGNOSIS — E8770 Fluid overload, unspecified: Secondary | ICD-10-CM | POA: Diagnosis present

## 2018-07-13 DIAGNOSIS — R0902 Hypoxemia: Secondary | ICD-10-CM

## 2018-07-13 DIAGNOSIS — F419 Anxiety disorder, unspecified: Secondary | ICD-10-CM | POA: Diagnosis present

## 2018-07-13 DIAGNOSIS — Z8505 Personal history of malignant neoplasm of liver: Secondary | ICD-10-CM

## 2018-07-13 DIAGNOSIS — D509 Iron deficiency anemia, unspecified: Secondary | ICD-10-CM | POA: Diagnosis present

## 2018-07-13 DIAGNOSIS — K7030 Alcoholic cirrhosis of liver without ascites: Secondary | ICD-10-CM | POA: Diagnosis present

## 2018-07-13 DIAGNOSIS — E785 Hyperlipidemia, unspecified: Secondary | ICD-10-CM | POA: Diagnosis present

## 2018-07-13 DIAGNOSIS — F1011 Alcohol abuse, in remission: Secondary | ICD-10-CM | POA: Diagnosis present

## 2018-07-13 DIAGNOSIS — Z992 Dependence on renal dialysis: Secondary | ICD-10-CM

## 2018-07-13 DIAGNOSIS — D631 Anemia in chronic kidney disease: Secondary | ICD-10-CM | POA: Diagnosis present

## 2018-07-13 DIAGNOSIS — J9801 Acute bronchospasm: Secondary | ICD-10-CM | POA: Diagnosis present

## 2018-07-13 LAB — BASIC METABOLIC PANEL
ANION GAP: 9 (ref 5–15)
BUN: 32 mg/dL — ABNORMAL HIGH (ref 8–23)
CO2: 16 mmol/L — AB (ref 22–32)
Calcium: 8.7 mg/dL — ABNORMAL LOW (ref 8.9–10.3)
Chloride: 95 mmol/L — ABNORMAL LOW (ref 98–111)
Creatinine, Ser: 3.94 mg/dL — ABNORMAL HIGH (ref 0.44–1.00)
GFR calc Af Amer: 12 mL/min — ABNORMAL LOW (ref >60)
GFR calc non Af Amer: 10 mL/min — ABNORMAL LOW (ref >60)
GLUCOSE: 132 mg/dL — AB (ref 70–99)
POTASSIUM: 4.4 mmol/L (ref 3.5–5.1)
Sodium: 120 mmol/L — ABNORMAL LOW (ref 135–145)

## 2018-07-13 LAB — CBC
HEMATOCRIT: 26.2 % — AB (ref 36.0–46.0)
HEMOGLOBIN: 8.4 g/dL — AB (ref 12.0–15.0)
MCH: 28.4 pg (ref 26.0–34.0)
MCHC: 32.1 g/dL (ref 30.0–36.0)
MCV: 88.5 fL (ref 78.0–100.0)
Platelets: 155 10*3/uL (ref 150–400)
RBC: 2.96 MIL/uL — ABNORMAL LOW (ref 3.87–5.11)
RDW: 14.3 % (ref 11.5–15.5)
WBC: 3.6 10*3/uL — ABNORMAL LOW (ref 4.0–10.5)

## 2018-07-13 LAB — I-STAT TROPONIN, ED: Troponin i, poc: 0.02 ng/mL (ref 0.00–0.08)

## 2018-07-13 NOTE — ED Triage Notes (Signed)
Pt reports weakness since yesterday and generalized "not feeling good."  Pt is waiting to start dialysis, placed in left arm in June.  Pt states it feels as if her heart is racing.

## 2018-07-14 ENCOUNTER — Other Ambulatory Visit: Payer: Self-pay

## 2018-07-14 ENCOUNTER — Encounter (HOSPITAL_COMMUNITY): Payer: Self-pay

## 2018-07-14 DIAGNOSIS — K7030 Alcoholic cirrhosis of liver without ascites: Secondary | ICD-10-CM | POA: Diagnosis not present

## 2018-07-14 DIAGNOSIS — I160 Hypertensive urgency: Secondary | ICD-10-CM

## 2018-07-14 DIAGNOSIS — D631 Anemia in chronic kidney disease: Secondary | ICD-10-CM | POA: Diagnosis present

## 2018-07-14 DIAGNOSIS — C220 Liver cell carcinoma: Secondary | ICD-10-CM

## 2018-07-14 DIAGNOSIS — I10 Essential (primary) hypertension: Secondary | ICD-10-CM

## 2018-07-14 DIAGNOSIS — N186 End stage renal disease: Secondary | ICD-10-CM | POA: Diagnosis not present

## 2018-07-14 DIAGNOSIS — E871 Hypo-osmolality and hyponatremia: Secondary | ICD-10-CM | POA: Diagnosis not present

## 2018-07-14 LAB — CBC
HCT: 23.5 % — ABNORMAL LOW (ref 36.0–46.0)
HEMOGLOBIN: 7.8 g/dL — AB (ref 12.0–15.0)
MCH: 28.7 pg (ref 26.0–34.0)
MCHC: 33.2 g/dL (ref 30.0–36.0)
MCV: 86.4 fL (ref 78.0–100.0)
PLATELETS: 133 10*3/uL — AB (ref 150–400)
RBC: 2.72 MIL/uL — AB (ref 3.87–5.11)
RDW: 14.3 % (ref 11.5–15.5)
WBC: 3.7 10*3/uL — AB (ref 4.0–10.5)

## 2018-07-14 LAB — URINALYSIS, ROUTINE W REFLEX MICROSCOPIC
BILIRUBIN URINE: NEGATIVE
Glucose, UA: NEGATIVE mg/dL
Hgb urine dipstick: NEGATIVE
KETONES UR: NEGATIVE mg/dL
Leukocytes, UA: NEGATIVE
NITRITE: NEGATIVE
Protein, ur: >=300 mg/dL — AB
SPECIFIC GRAVITY, URINE: 1.019 (ref 1.005–1.030)
pH: 5 (ref 5.0–8.0)

## 2018-07-14 LAB — BASIC METABOLIC PANEL
ANION GAP: 9 (ref 5–15)
BUN: 36 mg/dL — ABNORMAL HIGH (ref 8–23)
CO2: 17 mmol/L — ABNORMAL LOW (ref 22–32)
CREATININE: 4.05 mg/dL — AB (ref 0.44–1.00)
Calcium: 8.6 mg/dL — ABNORMAL LOW (ref 8.9–10.3)
Chloride: 96 mmol/L — ABNORMAL LOW (ref 98–111)
GFR calc non Af Amer: 10 mL/min — ABNORMAL LOW (ref >60)
GFR, EST AFRICAN AMERICAN: 12 mL/min — AB (ref >60)
GLUCOSE: 105 mg/dL — AB (ref 70–99)
Potassium: 4.5 mmol/L (ref 3.5–5.1)
Sodium: 122 mmol/L — ABNORMAL LOW (ref 135–145)

## 2018-07-14 LAB — OSMOLALITY: Osmolality: 265 mOsm/kg — ABNORMAL LOW (ref 275–295)

## 2018-07-14 LAB — HEPATIC FUNCTION PANEL
ALK PHOS: 46 U/L (ref 38–126)
ALT: 10 U/L (ref 0–44)
AST: 28 U/L (ref 15–41)
Albumin: 2.9 g/dL — ABNORMAL LOW (ref 3.5–5.0)
Bilirubin, Direct: 0.2 mg/dL (ref 0.0–0.2)
Indirect Bilirubin: 0.7 mg/dL (ref 0.3–0.9)
TOTAL PROTEIN: 7.2 g/dL (ref 6.5–8.1)
Total Bilirubin: 0.9 mg/dL (ref 0.3–1.2)

## 2018-07-14 LAB — AMMONIA: AMMONIA: 26 umol/L (ref 9–35)

## 2018-07-14 LAB — SODIUM, URINE, RANDOM

## 2018-07-14 LAB — APTT: aPTT: 46 seconds — ABNORMAL HIGH (ref 24–36)

## 2018-07-14 LAB — TSH: TSH: 1.123 u[IU]/mL (ref 0.350–4.500)

## 2018-07-14 LAB — TYPE AND SCREEN
ABO/RH(D): O POS
Antibody Screen: NEGATIVE

## 2018-07-14 LAB — PROTIME-INR
INR: 0.98
Prothrombin Time: 12.9 seconds (ref 11.4–15.2)

## 2018-07-14 LAB — OSMOLALITY, URINE: Osmolality, Ur: 296 mOsm/kg — ABNORMAL LOW (ref 300–900)

## 2018-07-14 MED ORDER — PROPRANOLOL HCL 40 MG PO TABS
40.0000 mg | ORAL_TABLET | Freq: Two times a day (BID) | ORAL | Status: DC
  Administered 2018-07-14 – 2018-07-23 (×16): 40 mg via ORAL
  Filled 2018-07-14 (×22): qty 1

## 2018-07-14 MED ORDER — SODIUM CHLORIDE 0.9 % IV SOLN
INTRAVENOUS | Status: DC | PRN
  Administered 2018-07-14: 1000 mL via INTRAVENOUS

## 2018-07-14 MED ORDER — LACTULOSE 10 GM/15ML PO SOLN
20.0000 g | Freq: Every day | ORAL | Status: DC
  Administered 2018-07-14 – 2018-07-23 (×4): 20 g via ORAL
  Filled 2018-07-14 (×10): qty 30

## 2018-07-14 MED ORDER — SENNOSIDES-DOCUSATE SODIUM 8.6-50 MG PO TABS: 1.0000 | ORAL_TABLET | Freq: Every evening | ORAL | Status: DC | PRN

## 2018-07-14 MED ORDER — KETOTIFEN FUMARATE 0.025 % OP SOLN
1.0000 [drp] | Freq: Two times a day (BID) | OPHTHALMIC | Status: DC
  Administered 2018-07-14 – 2018-07-23 (×20): 1 [drp] via OPHTHALMIC
  Filled 2018-07-14: qty 5

## 2018-07-14 MED ORDER — HEPARIN SODIUM (PORCINE) 5000 UNIT/ML IJ SOLN
5000.0000 [IU] | Freq: Three times a day (TID) | INTRAMUSCULAR | Status: DC
  Filled 2018-07-14: qty 1

## 2018-07-14 MED ORDER — HYPROMELLOSE (GONIOSCOPIC) 2.5 % OP SOLN
1.0000 [drp] | Freq: Every day | OPHTHALMIC | Status: DC
  Administered 2018-07-14 – 2018-07-23 (×10): 1 [drp] via OPHTHALMIC
  Filled 2018-07-14: qty 15

## 2018-07-14 MED ORDER — FOLIC ACID 1 MG PO TABS
1.0000 mg | ORAL_TABLET | Freq: Every day | ORAL | Status: DC
  Administered 2018-07-14 – 2018-07-23 (×10): 1 mg via ORAL
  Filled 2018-07-14 (×10): qty 1

## 2018-07-14 MED ORDER — ALPRAZOLAM 0.5 MG PO TABS
0.5000 mg | ORAL_TABLET | Freq: Every day | ORAL | Status: DC
  Administered 2018-07-14 – 2018-07-22 (×10): 0.5 mg via ORAL
  Filled 2018-07-14: qty 1
  Filled 2018-07-14: qty 2
  Filled 2018-07-14 (×2): qty 1
  Filled 2018-07-14: qty 2
  Filled 2018-07-14 (×2): qty 1
  Filled 2018-07-14: qty 2
  Filled 2018-07-14 (×2): qty 1

## 2018-07-14 MED ORDER — VITAMIN D 1000 UNITS PO TABS
1000.0000 [IU] | ORAL_TABLET | Freq: Every day | ORAL | Status: DC
  Administered 2018-07-14 – 2018-07-23 (×10): 1000 [IU] via ORAL
  Filled 2018-07-14 (×10): qty 1

## 2018-07-14 MED ORDER — HYDROCODONE-ACETAMINOPHEN 5-325 MG PO TABS: 1.0000 | ORAL_TABLET | Freq: Four times a day (QID) | ORAL | Status: DC | PRN

## 2018-07-14 MED ORDER — ACETAMINOPHEN 650 MG RE SUPP: 650.0000 mg | Freq: Four times a day (QID) | RECTAL | Status: DC | PRN

## 2018-07-14 MED ORDER — ACETAMINOPHEN 325 MG PO TABS: 650.0000 mg | ORAL_TABLET | Freq: Four times a day (QID) | ORAL | Status: DC | PRN

## 2018-07-14 MED ORDER — HYDRALAZINE HCL 20 MG/ML IJ SOLN
10.0000 mg | Freq: Once | INTRAMUSCULAR | Status: AC
  Administered 2018-07-14: 10 mg via INTRAVENOUS
  Filled 2018-07-14: qty 1

## 2018-07-14 MED ORDER — NEPRO/CARBSTEADY PO LIQD
237.0000 mL | Freq: Three times a day (TID) | ORAL | Status: DC
  Administered 2018-07-14 – 2018-07-23 (×13): 237 mL via ORAL
  Filled 2018-07-14 (×8): qty 237

## 2018-07-14 MED ORDER — NEPRO/CARBSTEADY PO LIQD: 237.0000 mL | Freq: Two times a day (BID) | ORAL | Status: DC

## 2018-07-14 MED ORDER — CLONIDINE HCL 0.1 MG PO TABS
0.1000 mg | ORAL_TABLET | Freq: Three times a day (TID) | ORAL | Status: DC
  Administered 2018-07-14 – 2018-07-17 (×11): 0.1 mg via ORAL
  Filled 2018-07-14 (×11): qty 1

## 2018-07-14 MED ORDER — ALBUTEROL SULFATE (2.5 MG/3ML) 0.083% IN NEBU
2.5000 mg | INHALATION_SOLUTION | RESPIRATORY_TRACT | Status: DC | PRN
  Administered 2018-07-16 – 2018-07-19 (×3): 2.5 mg via RESPIRATORY_TRACT
  Filled 2018-07-14 (×3): qty 3

## 2018-07-14 MED ORDER — FAMOTIDINE IN NACL 20-0.9 MG/50ML-% IV SOLN
20.0000 mg | Freq: Once | INTRAVENOUS | Status: AC
  Administered 2018-07-14: 20 mg via INTRAVENOUS
  Filled 2018-07-14: qty 50

## 2018-07-14 MED ORDER — ZOLPIDEM TARTRATE 5 MG PO TABS
5.0000 mg | ORAL_TABLET | Freq: Every evening | ORAL | Status: DC | PRN
  Administered 2018-07-19: 5 mg via ORAL
  Filled 2018-07-14: qty 1

## 2018-07-14 MED ORDER — HYDRALAZINE HCL 20 MG/ML IJ SOLN
5.0000 mg | INTRAMUSCULAR | Status: DC | PRN
  Administered 2018-07-19 – 2018-07-20 (×3): 5 mg via INTRAVENOUS
  Filled 2018-07-14 (×3): qty 1

## 2018-07-14 MED ORDER — ENSURE ENLIVE PO LIQD
237.0000 mL | Freq: Every day | ORAL | Status: DC
  Administered 2018-07-14: 237 mL via ORAL

## 2018-07-14 MED ORDER — HYDRALAZINE HCL 50 MG PO TABS
100.0000 mg | ORAL_TABLET | Freq: Three times a day (TID) | ORAL | Status: DC
  Administered 2018-07-14 – 2018-07-23 (×27): 100 mg via ORAL
  Filled 2018-07-14 (×29): qty 2

## 2018-07-14 MED ORDER — AMLODIPINE BESYLATE 10 MG PO TABS
10.0000 mg | ORAL_TABLET | Freq: Every day | ORAL | Status: DC
  Administered 2018-07-14 – 2018-07-23 (×9): 10 mg via ORAL
  Filled 2018-07-14: qty 2
  Filled 2018-07-14 (×6): qty 1
  Filled 2018-07-14 (×2): qty 2

## 2018-07-14 NOTE — Plan of Care (Signed)
  Problem: Coping: Goal: Level of anxiety will decrease Outcome: Completed/Met   Problem: Pain Managment: Goal: General experience of comfort will improve Outcome: Completed/Met   Problem: Skin Integrity: Goal: Risk for impaired skin integrity will decrease Outcome: Completed/Met

## 2018-07-14 NOTE — ED Notes (Signed)
Dr.NIU at bedside. 

## 2018-07-14 NOTE — H&P (Addendum)
History and Physical    Sarah Harmon FAO:[id number] DOB: 27-Oct-1946 DOA: 07/13/2018  Referring MD/NP/PA:   PCP: Sarah Borg, MD   Patient coming from:  The patient is coming from home.  At baseline, pt is independent for most of ADL.   Chief Complaint: Generalized weakness  HPI: Sarah Harmon is a 72 y.o. 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  Patient states that she has not been feeling well in the past several days, including generalized weakness, fatigue, decreased appetite and oral intake.  Denies unilateral weakness, numbness or tingling in her extremities.  She does not have chest pain, cough, fever or chills.  She states that she has mild shortness on exertion.  Denies nausea, vomiting, diarrhea, abdominal pain.  No symptoms of  UTI.  Patient also reports blood pressure is uncontrolled recently even though she is taking her prescribed blood pressure medications at home.  ED Course: pt was found to have sodium of 120, WBCs 3.6, INR 0.98, PTT 46, ammonia 26, potassium 4.4, bicarbonate 16, creatinine 3.94, BUN 32, temperature normal, bradycardia, tachypnea, oxygen saturation 97% on room air, temperature 97.7, blood pressure elevation to 221/72. Chest x-ray showed mild pulmonary edema and small bilateral pleural effusion.  Patient is placed on telemetry bed of observation.  Review of Systems:   General: no fevers, chills, no body weight gain, has poor appetite, has fatigue HEENT: no blurry vision, hearing changes or sore throat Respiratory: has dyspnea, no coughing, wheezing CV: no chest pain, no palpitations GI: no nausea, vomiting, abdominal  pain, diarrhea, constipation GU: no dysuria, burning on urination, increased urinary frequency, hematuria  Ext: has trace leg edema Neuro: no unilateral weakness, numbness, or tingling, no vision change or hearing loss Skin: no rash, no skin tear. MSK: No muscle spasm, no deformity, no limitation of range of movement in spin Heme: No easy bruising.  Travel history: No recent long distant travel.  Allergy: No Known Allergies  Past Medical History:  Diagnosis Date  . ALCOHOL ABUSE 07/13/2010  . ANXIETY 12/22/2009  . Cancer [facility])    liver cancer  . DEPRESSION 12/22/2009  . 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 DJD (degenerative joint disease), cervical    patient denies on preop of 05/10/15   . FATIGUE 12/23/2010  . GALLSTONES 12/22/2009  . Gallstones   . Headache    occasional   . HYPERLIPIDEMIA 12/22/2009  . HYPERTENSION 12/22/2009  . HYPONATREMIA 12/22/2009  . 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 OSTEOPENIA 12/22/2009  . Pancytopenia 07/13/2010  . RENAL CYST, LEFT 12/22/2009  . VITAMIN D DEFICIENCY 12/23/2010  . WEIGHT LOSS 12/23/2010    Past Surgical History:  Procedure Laterality Date  . ABDOMINAL HYSTERECTOMY    . AV FISTULA PLACEMENT Left 05/21/2018   Procedure: INSERTION OF ARTERIOVENOUS (AV) GORE-TEX GRAFT UPPER ARM;  Surgeon: Sarah Dutch, MD;  Location: [facility] OR;  Service: Vascular;  Laterality: Left;  . AV FISTULA PLACEMENT Left 06/03/2018   Procedure: INSERTION OF ARTERIOVENOUS (AV) GORE-TEX 81mm x 10cm GRAFT INTO LEFT ARM;  Surgeon: Sarah Dutch, MD;  Location: [place];  Service: Vascular;  Laterality: Left;  . IR GENERIC HISTORICAL  10/18/2016   IR RADIOLOGIST EVAL & MGMT 10/18/2016 [name], MD [facility]  INTERV RAD  . IR RADIOLOGIST EVAL & MGMT  05/30/2017  . surgery for liver ca    . THROMBECTOMY AND REVISION OF ARTERIOVENTOUS (AV) GORETEX  GRAFT Left 06/03/2018   Procedure: THROMBECTOMY AND REVISION OF ARTERIOVENTOUS (AV) GORETEX  GRAFT ARM;  Surgeon: Sarah Dutch,  MD;  Location: [place] OR;  Service: Vascular;  Laterality: Left;    Social History:  reports that she has never smoked. She has never used smokeless tobacco. She reports that she drinks about 1.8 - 2.4 oz of alcohol per week. She reports that she does not use drugs.  Family History:  Family History  Problem Relation Age of Onset  . Lung cancer Brother   . Breast cancer Other   . Breast cancer Sister   . Gastric cancer Sister   . Lung cancer Sister   . Colon cancer Neg Hx   . Colon polyps Neg Hx   . Rectal cancer Neg Hx   . Stomach cancer Neg Hx   . Esophageal cancer Neg Hx      Prior to Admission medications   Medication Sig Start Date End Date Taking? Authorizing Provider  ALPRAZolam Sarah Harmon) 0.5 MG tablet TAKE 1 TABLET BY MOUTH ONCE DAILY AT BEDTIME 06/25/18  Yes Sarah Borg, MD  amLODipine (NORVASC) 10 MG tablet TAKE 1 TABLET BY MOUTH ONCE DAILY 03/11/18  Yes Sarah Borg, MD  Cholecalciferol (VITAMIN D3) 1000 UNITS CAPS Take 1,000 Units by mouth daily.    Yes [provider]  ENSURE (ENSURE) Take 237 mLs by mouth daily.    Yes [provider]  folic acid (FOLVITE) 1 MG tablet TAKE 1 TABLET BY MOUTH ONCE DAILY 04/16/18  Yes Sarah Borg, MD  hydrALAZINE (APRESOLINE) 100 MG tablet TAKE 1 TABLET BY MOUTH THREE TIMES DAILY 04/09/18  Yes Sarah Borg, MD  ketotifen (ZADITOR) 0.025 % ophthalmic solution Place 1 drop into both eyes 2 (two) times daily.   Yes [provider]  lactulose (CHRONULAC) 10 GM/15ML solution Take 30 mLs (20 g total) by mouth daily. 09/17/17  Yes Sarah Borg, MD  propranolol (INDERAL) 40 MG tablet Take 1 tablet (40 mg total) by mouth 2 (two) times daily. 09/04/17  Yes Sarah Borg, MD  Propylene Glycol (SYSTANE BALANCE) 0.6 % SOLN Place 1 drop into both eyes daily.   Yes [provider]  citalopram (CELEXA) 20 MG tablet TAKE ONE-HALF TABLET BY MOUTH ONCE DAILY Patient not taking: Reported on 06/27/2018 02/14/18   Sarah Borg, MD    cloNIDine (CATAPRES) 0.1 MG tablet Take 1 tablet (0.1 mg total) by mouth daily. Patient not taking: Reported on 05/21/2018 09/18/17   Sarah Poisson, MD  furosemide (LASIX) 40 MG tablet Take 1 tablet (40 mg total) by mouth daily. Patient not taking: Reported on 05/21/2018 09/18/17   Sarah Poisson, MD  HYDROcodone-acetaminophen (NORCO) 5-325 MG tablet Take 1 tablet by mouth every 6 (six) hours as needed for moderate pain. Patient not taking: Reported on 06/27/2018 06/03/18   Ulyses Amor, PA-C  isosorbide dinitrate (ISORDIL) 30 MG tablet Take 1 tablet (30 mg total) by mouth 3 (three) times daily. Patient not taking: Reported on 07/14/2018 09/17/17   Sarah Poisson, MD  Nutritional Supplements (FEEDING SUPPLEMENT, NEPRO CARB STEADY,) LIQD Take 237 mLs by mouth 2 (two) times daily between meals. Patient not taking: Reported on 07/14/2018 05/09/18   Johnsie Cancel, NP    Physical Exam: Vitals:   07/14/18 0100 07/14/18 0200  07/14/18 0203 07/14/18 0230  BP: (!) 210/74 (!) 206/73 (!) 206/73 (!) 203/73  Pulse: (!) 55 (!) 55 (!) 56 (!) 55  Resp: (!) 24 20 (!) 26 (!) 25  Temp:      TempSrc:      SpO2: 99% 99% 100% 99%  Weight:      Height:       General: Not in acute distress HEENT:       Eyes: PERRL, EOMI, no scleral icterus.       ENT: No discharge from the ears and nose, no pharynx injection, no tonsillar enlargement.        Neck: +JVD, no bruit, no mass felt. Heme: No neck lymph node enlargement. Cardiac: S1/S2, RRR, No murmurs, No gallops or rubs. Respiratory: No rales, wheezing, rhonchi or rubs. GI: Soft, nondistended, nontender, no rebound pain, no organomegaly, BS present. GU: No hematuria Ext: has trace leg edema bilaterally. 2+DP/PT pulse bilaterally.  AVF placed in L arm, with functioning bruit. Musculoskeletal: No joint deformities, No joint redness or warmth, no limitation of ROM in spin. Skin: No rashes.  Neuro: Alert, oriented X3, cranial nerves II-XII grossly intact, moves all  extremities normally. Psych: Patient is not psychotic, no suicidal or hemocidal ideation.  Labs on Admission: I have personally reviewed following labs and imaging studies  CBC: Recent Labs  Lab 07/13/18 2013  WBC 3.6*  HGB 8.4*  HCT 26.2*  MCV 88.5  PLT 443   Basic Metabolic Panel: Recent Labs  Lab 07/13/18 2013  NA 120*  K 4.4  CL 95*  CO2 16*  GLUCOSE 132*  BUN 32*  CREATININE 3.94*  CALCIUM 8.7*   GFR: Estimated Creatinine Clearance: 10.2 mL/min (A) (by C-G formula based on SCr of 3.94 mg/dL (H)). Liver Function Tests: Recent Labs  Lab 07/14/18 0124  AST 28  ALT 10  ALKPHOS 46  BILITOT 0.9  PROT 7.2  ALBUMIN 2.9*   No results for input(s): LIPASE, AMYLASE in the last 168 hours. Recent Labs  Lab 07/14/18 0124  AMMONIA 26   Coagulation Profile: Recent Labs  Lab 07/14/18 0124  INR 0.98   Cardiac Enzymes: No results for input(s): CKTOTAL, CKMB, CKMBINDEX, TROPONINI in the last 168 hours. BNP (last 3 results) No results for input(s): PROBNP in the last 8760 hours. HbA1C: No results for input(s): HGBA1C in the last 72 hours. CBG: No results for input(s): GLUCAP in the last 168 hours. Lipid Profile: No results for input(s): CHOL, HDL, LDLCALC, TRIG, CHOLHDL, LDLDIRECT in the last 72 hours. Thyroid Function Tests: No results for input(s): TSH, T4TOTAL, FREET4, T3FREE, THYROIDAB in the last 72 hours. Anemia Panel: No results for input(s): VITAMINB12, FOLATE, FERRITIN, TIBC, IRON, RETICCTPCT in the last 72 hours. Urine analysis:    Component Value Date/Time   COLORURINE YELLOW 09/10/2017 0605   APPEARANCEUR HAZY (A) 09/10/2017 0605   LABSPEC 1.014 09/10/2017 0605   PHURINE 6.0 09/10/2017 0605   GLUCOSEU NEGATIVE 09/10/2017 0605   GLUCOSEU NEGATIVE 09/04/2017 0943   HGBUR NEGATIVE 09/10/2017 0605   BILIRUBINUR NEGATIVE 09/10/2017 0605   KETONESUR NEGATIVE 09/10/2017 0605   PROTEINUR >=300 (A) 09/10/2017 0605   UROBILINOGEN 0.2 09/04/2017 0943     NITRITE NEGATIVE 09/10/2017 0605   LEUKOCYTESUR NEGATIVE 09/10/2017 0605   Sepsis Labs: @LABRCNTIP (procalcitonin:4,lacticidven:4) )No results found for this or any previous visit (from the past 240 hour(s)).   Radiological Exams on Admission: Dg Chest 2 View  Result Date: 07/13/2018 CLINICAL DATA:  Chest pain, weakness EXAM:  CHEST - 2 VIEW COMPARISON:  09/10/2017 FINDINGS: There are small to moderate bilateral pleural effusions with bibasilar atelectasis. Diffuse interstitial opacities throughout the lungs, likely edema. Mild cardiomegaly. IMPRESSION: Mild pulmonary edema. Small to moderate bilateral effusions with bibasilar atelectasis. Electronically Signed   By: Rolm Baptise M.D.   On: 07/13/2018 20:30     EKG: Independently reviewed.  QTC 489, no ischemic change.  Assessment/Plan Principal Problem:   Hyponatremia Active Problems:   Depression   Essential hypertension   Hypertensive urgency   Alcoholic cirrhosis of liver without ascites (HCC)   Hepatocellular carcinoma (HCC)   ESRD (end stage renal disease) ([place])   Anemia due to end stage renal disease (HCC)   Hyponatremia: Most likely due to poor oral intake. She state that she is not taking lasix currently. This is a chronic issue, but her baseline.  Her baseline 125-130, now she has sodium 120.  Mental status normal. Will not give IV NS since patient already has mild fluid overload.  -will place on tele bed - Will check urine sodium, urine osmolality, serum osmolality. - check TSH - Fluid restriction to 1200  - f/u by BMP  Depression and anxiety: Stable, no suicidal or homicidal ideations. Not taking her Celexa, but taking Xanax at home -Continue home medications: Xanax  Essential hypertension and Hypertensive urgency: Bp 221/72. No Cp. -resume home clonidine 0.1 mg tid -Continue amlodipin -IV hydralazine as needed  Alcoholic cirrhosis of liver without ascites ([place]): Mental status normal.  Ammonia level  26 -Continue lactulose  Hepatocellular carcinoma ([place]): Patient has been cancer free for 3 years. -Follow-up with oncologist  ESRD (end stage renal disease) ([place]): has AVF placed in L arm, with functioning bruit. Not started HD yet. Pt has mild fluid overload, has mild SOB on exertion. -will need to restart lasix when sodium level back to her baseline -Fluid restriction  Anemia due to end stage renal disease [facility]): hgb 8.4 -check anemia panel  Generalized weakness: Multifactorial etiology as above, no focal neurologic findings on physical examination. -PT/OT   DVT ppx: SCD Code Status: Full code Family Communication: Yes, patient's daughter  at bed side Disposition Plan:  Anticipate discharge back to previous home environment Consults called:  none Admission status: Obs / tele     Date of Service 07/14/2018    Ivor Costa [facility] Pager [phone]  If 7PM-7AM, please contact night-coverage [email or web address] Password [facility] 07/14/2018, 3:35 AM

## 2018-07-14 NOTE — ED Provider Notes (Addendum)
[facility] EMERGENCY DEPARTMENT Provider Note   CSN: [id number] Arrival date & time: 07/13/18  1947     History   Chief Complaint Chief Complaint  Patient presents with  . Weakness  . Hypertension    HPI Sarah Harmon is a 72 y.o. female.  HPI  72 y/o comes in with weakness and elevated BP. Pt has hx ESRD, not on HD yet and HTN, HL, Hyponatremia. According to the daughter patient has been week for 1 week, and it is described generalized weakness. There is no associated n/v/f/c/chest pain/cough. Pt has some dib which is new with exertion. Pt denies any bloody stools.  Family also reports that patient's blood pressure has been elevated the last few days.  They have been checking the blood pressure regularly as per the request of the nephrologist, and the BP peaked over 671 systolic today.  Patient has been compliant with all of her medications and there are no new changes to her medication regimen.  Past Medical History:  Diagnosis Date  . ALCOHOL ABUSE 07/13/2010  . ANXIETY 12/22/2009  . Cancer [facility])    liver cancer  . DEPRESSION 12/22/2009  . 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 DJD (degenerative joint disease), cervical    patient denies on preop of 05/10/15   . FATIGUE 12/23/2010  . GALLSTONES 12/22/2009  . Gallstones   . Headache    occasional   . HYPERLIPIDEMIA 12/22/2009  . HYPERTENSION 12/22/2009  . HYPONATREMIA 12/22/2009  . 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 OSTEOPENIA 12/22/2009  . Pancytopenia 07/13/2010  . RENAL CYST, LEFT 12/22/2009  . VITAMIN D DEFICIENCY 12/23/2010  . WEIGHT LOSS 12/23/2010    Patient Active Problem List   Diagnosis Date Noted  . Anemia due to end stage renal disease ([place]) 07/14/2018  . ESRD (end stage renal disease) ([place])   . Anemia due to chronic kidney disease   . CKD (chronic kidney disease) stage 3, GFR 30-59 ml/min (HCC) 09/25/2017  . Hyperkalemia 09/10/2017  . Acute metabolic encephalopathy 24/58/0998  .  Elevated troponin 09/10/2017  . Slurred speech 09/10/2017  . Cancer, hepatocellular ([place])   . Cirrhosis of liver ([place]) 03/04/2015  . Thrombocytopenia ([place]) 03/04/2015  . Bradycardia 03/04/2015  . Acute renal failure superimposed on stage 3 chronic kidney disease ([place]) 03/04/2015  . Liver mass, right lobe   . Neoplasm /cancer ([place])   . Hepatocellular carcinoma ([place])   . Alcoholic cirrhosis of liver without ascites ([place])   . Liver mass   . Left kidney mass   . 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 SIADH (syndrome of inappropriate ADH production) ([place])   . Hyponatremia 02/26/2015  . Hypertensive urgency 02/26/2015  . Rash and nonspecific skin eruption 07/29/2013  . Impaired glucose tolerance 12/27/2012  . Fatigue 12/26/2011  . Preventative health care 06/25/2011  . VITAMIN D DEFICIENCY 12/23/2010  . WEIGHT LOSS 12/23/2010  . 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 Pancytopenia ([place]) 07/13/2010  . ALCOHOL ABUSE 07/13/2010  . 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 Hyperlipidemia 12/22/2009  . Anxiety state 12/22/2009  . Depression 12/22/2009  . Essential hypertension 12/22/2009  . GALLSTONES 12/22/2009  . RENAL CYST, LEFT 12/22/2009  . Osteoporosis 12/22/2009  . OTHER NONSPECIFIC FINDING EXAMINATION OF URINE 12/22/2009    Past Surgical History:  Procedure Laterality Date  . ABDOMINAL HYSTERECTOMY    . AV FISTULA PLACEMENT Left 05/21/2018   Procedure: INSERTION OF ARTERIOVENOUS (AV) GORE-TEX GRAFT UPPER ARM;  Surgeon:  Elam Dutch, MD;  Location: [facility] OR;  Service: Vascular;  Laterality: Left;  . AV FISTULA PLACEMENT Left 06/03/2018   Procedure: INSERTION OF ARTERIOVENOUS (AV) GORE-TEX 63mm x 10cm GRAFT INTO LEFT ARM;  Surgeon: Elam Dutch, MD;  Location: [place];  Service: Vascular;  Laterality: Left;  . IR GENERIC HISTORICAL  10/18/2016   IR RADIOLOGIST EVAL & MGMT 10/18/2016 [name], MD [facility] INTERV RAD  . IR RADIOLOGIST EVAL & MGMT  05/30/2017  . surgery for liver  ca    . THROMBECTOMY AND REVISION OF ARTERIOVENTOUS (AV) GORETEX  GRAFT Left 06/03/2018   Procedure: THROMBECTOMY AND REVISION OF ARTERIOVENTOUS (AV) GORETEX  GRAFT ARM;  Surgeon: Elam Dutch, MD;  Location: [place] OR;  Service: Vascular;  Laterality: Left;     OB History   None      Home Medications    Prior to Admission medications   Medication Sig Start Date End Date Taking? Authorizing Provider  ALPRAZolam Duanne Moron) 0.5 MG tablet TAKE 1 TABLET BY MOUTH ONCE DAILY AT BEDTIME 06/25/18  Yes Biagio Borg, MD  amLODipine (NORVASC) 10 MG tablet TAKE 1 TABLET BY MOUTH ONCE DAILY 03/11/18  Yes Biagio Borg, MD  Cholecalciferol (VITAMIN D3) 1000 UNITS CAPS Take 1,000 Units by mouth daily.    Yes [provider]  ENSURE (ENSURE) Take 237 mLs by mouth daily.    Yes [provider]  folic acid (FOLVITE) 1 MG tablet TAKE 1 TABLET BY MOUTH ONCE DAILY 04/16/18  Yes Biagio Borg, MD  hydrALAZINE (APRESOLINE) 100 MG tablet TAKE 1 TABLET BY MOUTH THREE TIMES DAILY 04/09/18  Yes Biagio Borg, MD  ketotifen (ZADITOR) 0.025 % ophthalmic solution Place 1 drop into both eyes 2 (two) times daily.   Yes [provider]  lactulose (CHRONULAC) 10 GM/15ML solution Take 30 mLs (20 g total) by mouth daily. 09/17/17  Yes Biagio Borg, MD  propranolol (INDERAL) 40 MG tablet Take 1 tablet (40 mg total) by mouth 2 (two) times daily. 09/04/17  Yes Biagio Borg, MD  Propylene Glycol (SYSTANE BALANCE) 0.6 % SOLN Place 1 drop into both eyes daily.   Yes [provider]  citalopram (CELEXA) 20 MG tablet TAKE ONE-HALF TABLET BY MOUTH ONCE DAILY Patient not taking: Reported on 06/27/2018 02/14/18   Biagio Borg, MD  cloNIDine (CATAPRES) 0.1 MG tablet Take 1 tablet (0.1 mg total) by mouth daily. Patient not taking: Reported on 05/21/2018 09/18/17   Hosie Poisson, MD  furosemide (LASIX) 40 MG tablet Take 1 tablet (40 mg total) by mouth daily. Patient not taking: Reported on 05/21/2018 09/18/17    Hosie Poisson, MD  HYDROcodone-acetaminophen (NORCO) 5-325 MG tablet Take 1 tablet by mouth every 6 (six) hours as needed for moderate pain. Patient not taking: Reported on 06/27/2018 06/03/18   Ulyses Amor, PA-C  isosorbide dinitrate (ISORDIL) 30 MG tablet Take 1 tablet (30 mg total) by mouth 3 (three) times daily. Patient not taking: Reported on 07/14/2018 09/17/17   Hosie Poisson, MD  Nutritional Supplements (FEEDING SUPPLEMENT, NEPRO CARB STEADY,) LIQD Take 237 mLs by mouth 2 (two) times daily between meals. Patient not taking: Reported on 07/14/2018 05/09/18   Johnsie Cancel, NP    Family History Family History  Problem Relation Age of Onset  . Lung cancer Brother   . Breast cancer Other   . Breast cancer Sister   . Gastric cancer Sister   . Lung cancer Sister   .  Colon cancer Neg Hx   . Colon polyps Neg Hx   . Rectal cancer Neg Hx   . Stomach cancer Neg Hx   . Esophageal cancer Neg Hx     Social History Social History   Tobacco Use  . Smoking status: Never Smoker  . Smokeless tobacco: Never Used  Substance Use Topics  . Alcohol use: Yes    Alcohol/week: 1.8 - 2.4 oz    Types: 3 - 4 Cans of beer per week    Comment: she quit 02/2015   . Drug use: No     Allergies   Patient has no known allergies.   Review of Systems Review of Systems  Constitutional: Positive for activity change and fatigue.  Eyes: Negative for visual disturbance.  Respiratory: Positive for shortness of breath. Negative for cough.   Cardiovascular: Negative for chest pain.  Gastrointestinal: Negative for abdominal pain.  Neurological: Positive for dizziness. Negative for headaches.  All other systems reviewed and are negative.    Physical Exam Updated Vital Signs BP (!) 206/73 (BP Location: Right Arm)   Pulse (!) 56   Temp 97.7 F (36.5 C) (Oral)   Resp (!) 26   Ht 5\' 3"  (1.6 m)   Wt 50.8 kg (112 lb)   SpO2 100%   BMI 19.84 kg/m   Physical Exam  Constitutional: She is oriented  to person, place, and time. She appears well-developed.  HENT:  Head: Atraumatic.  Eyes: EOM are normal.  Neck: Normal range of motion. Neck supple.  Cardiovascular: Normal rate.  Pulmonary/Chest: Effort normal. She has no wheezes. She has no rales.  Abdominal: Soft. Bowel sounds are normal.  Musculoskeletal: She exhibits no edema.  Neurological: She is alert and oriented to person, place, and time. No cranial nerve deficit. Coordination normal.  Skin: Skin is warm and dry.  Nursing note and vitals reviewed.    ED Treatments / Results  Labs (all labs ordered are listed, but only abnormal results are displayed) Labs Reviewed  BASIC METABOLIC PANEL - Abnormal; Notable for the following components:      Result Value   Sodium 120 (*)    Chloride 95 (*)    CO2 16 (*)    Glucose, Bld 132 (*)    BUN 32 (*)    Creatinine, Ser 3.94 (*)    Calcium 8.7 (*)    GFR calc non Af Amer 10 (*)    GFR calc Af Amer 12 (*)    All other components within normal limits  CBC - Abnormal; Notable for the following components:   WBC 3.6 (*)    RBC 2.96 (*)    Hemoglobin 8.4 (*)    HCT 26.2 (*)    All other components within normal limits  HEPATIC FUNCTION PANEL - Abnormal; Notable for the following components:   Albumin 2.9 (*)    All other components within normal limits  APTT - Abnormal; Notable for the following components:   aPTT 46 (*)    All other components within normal limits  AMMONIA  PROTIME-INR  URINALYSIS, ROUTINE W REFLEX MICROSCOPIC  OSMOLALITY, URINE  OSMOLALITY  SODIUM, URINE, RANDOM  TSH  I-STAT TROPONIN, ED  TYPE AND SCREEN    EKG EKG Interpretation  Date/Time:  Saturday July 13 2018 20:01:11 EDT Ventricular Rate:  55 PR Interval:  198 QRS Duration: 86 QT Interval:  512 QTC Calculation: 489 R Axis:   48 Text Interpretation:  Sinus bradycardia Possible Left atrial enlargement Cannot  rule out Anterior infarct , age undetermined Abnormal ECG No acute changes  Confirmed by Varney Biles [phone]) on 07/13/2018 11:58:34 PM   Radiology Dg Chest 2 View  Result Date: 07/13/2018 CLINICAL DATA:  Chest pain, weakness EXAM: CHEST - 2 VIEW COMPARISON:  09/10/2017 FINDINGS: There are small to moderate bilateral pleural effusions with bibasilar atelectasis. Diffuse interstitial opacities throughout the lungs, likely edema. Mild cardiomegaly. IMPRESSION: Mild pulmonary edema. Small to moderate bilateral effusions with bibasilar atelectasis. Electronically Signed   By: Rolm Baptise M.D.   On: 07/13/2018 20:30    Procedures Procedures (including critical care time)  CRITICAL CARE Performed by: Kennith Morss   Total critical care time: 42 minutes for low Na and HTN emergency  Critical care time was exclusive of separately billable procedures and treating other patients.  Critical care was necessary to treat or prevent imminent or life-threatening deterioration.  Critical care was time spent personally by me on the following activities: development of treatment plan with patient and/or surrogate as well as nursing, discussions with consultants, evaluation of patient's response to treatment, examination of patient, obtaining history from patient or surrogate, ordering and performing treatments and interventions, ordering and review of laboratory studies, ordering and review of radiographic studies, pulse oximetry and re-evaluation of patient's condition.   Medications Ordered in ED Medications  famotidine (PEPCID) IVPB 20 mg premix (20 mg Intravenous New Bag/Given 07/14/18 0218)  0.9 %  sodium chloride infusion (1,000 mLs Intravenous New Bag/Given 07/14/18 0215)  cloNIDine (CATAPRES) tablet 0.1 mg (has no administration in time range)  feeding supplement (NEPRO CARB STEADY) liquid 237 mL (has no administration in time range)  HYDROcodone-acetaminophen (NORCO/VICODIN) 5-325 MG per tablet 1 tablet (has no administration in time range)  ALPRAZolam (XANAX) tablet  0.5 mg (has no administration in time range)  amLODipine (NORVASC) tablet 10 mg (has no administration in time range)  Vitamin D3 CAPS 1,000 Units (has no administration in time range)  ENSURE liquid 237 mL (has no administration in time range)  hydrALAZINE (APRESOLINE) tablet 100 mg (has no administration in time range)  ketotifen (ZADITOR) 0.025 % ophthalmic solution 1 drop (has no administration in time range)  lactulose (CHRONULAC) 10 GM/15ML solution 20 g (has no administration in time range)  propranolol (INDERAL) tablet 40 mg (has no administration in time range)  Propylene Glycol 0.6 % SOLN 1 drop (has no administration in time range)  hydrALAZINE (APRESOLINE) injection 5 mg (has no administration in time range)  albuterol (PROVENTIL) (2.5 MG/3ML) 0.083% nebulizer solution 2.5 mg (has no administration in time range)  hydrALAZINE (APRESOLINE) injection 10 mg (10 mg Intravenous Given 07/14/18 0213)     Initial Impression / Assessment and Plan / ED Course  I have reviewed the triage vital signs and the nursing notes.  Pertinent labs & imaging results that were available during my care of the patient were reviewed by me and considered in my medical decision making (see chart for details).    Pt comes in with chief complaint of elevated blood pressure.  Patient also complains of shortness of breath with exertion.  She has history of ESRD, not yet on hemodialysis, along with history of liver cirrhosis, liver cancer and alcohol abuse.  She also has history of hypertension.  Patient is noted to have a low sodium of 120.  Could be because of her Lasix, could be because of her liver disease.  Sodium is low enough, where I think patient needs to be admitted just for the hyponatremia.  Patient's BP is noted to be over 200 SBP, with that she does not have any endorgan damage symptoms at rest, only the dyspnea on exertion.  The DOE could be because of her worsening renal function, but there could  also be an element of elevated blood pressure.  Chest x-ray ordered.  We will give IV hydralazine 10 mg at this time.  Patient will need admission for BP optimization as well.  Final Clinical Impressions(s) / ED Diagnoses   Final diagnoses:  Acute hyponatremia  Hypertensive emergency    ED Discharge Orders    None       Varney Biles, MD 07/14/18 0349    Varney Biles, MD 07/14/18 [phone]

## 2018-07-14 NOTE — ED Notes (Signed)
IV team at bedside-Monique,RN  

## 2018-07-14 NOTE — Plan of Care (Signed)
72 year old female lives at home with her son independent for most of her ADLs admitted with generalized weakness earlier this morning by my partner.  Of ESRD AV fistula placed in the left arm June 2019, history of chronic hyponatremia baseline sodium 1 25-0 30, alcoholic cirrhosis without ascites, hepatocellular carcinoma status post surgery no chemoradiation, hypertension.  Patient was found to have low sodium at 120 ammonia level was 26.  She was awake alert and oriented at the time of admission.  Her sodium today has gone up to 122 without any IV fluids.  She remains in generalized weakness but she is awake alert oriented answers all my questions appropriately.  I will get PT OT evaluation follow-up sodium tomorrow.

## 2018-07-14 NOTE — Care Management Obs Status (Signed)
[place] NOTIFICATION   Patient Details  Name: ALEXES MENCHACA MRN: [id number] Date of Birth: 03-19-1946   Medicare Observation Status Notification Given:  Yes    Carles Collet, RN 07/14/2018, 3:01 PM

## 2018-07-14 NOTE — Evaluation (Signed)
Physical Therapy Evaluation Patient Details Name: Sarah Harmon MRN: [id number] DOB: 1946-05-26 Today's Date: 07/14/2018   History of Present Illness  Pt is a 72 y.o. female admitted 07/13/18 with generalized weakness; worked up for hyponatremia and fluid overload. PMH includes HTN, alcoholic cirrhosis, hepatocellular carcinoma, ESRD, depression.    Clinical Impression  Pt presents with an overall decrease in functional mobility secondary to above. PTA, pt indep and lives alone; has family available for PRN support. Today, pt ambulating at supervision-level; DOE 2/4, SpO2 96% on RA. Pt c/o dizziness while walking; VSS, negative for orthostatic hypotension. Encouraged pt to continue ambulating during admission with supervision from family and/or nursing staff (RN aware). Pt would benefit from continued acute PT services to maximize functional mobility and independence prior to d/c home.     Follow Up Recommendations No PT follow up;Supervision - Intermittent    Equipment Recommendations  None recommended by PT    Recommendations for Other Services       Precautions / Restrictions Precautions Precautions: Fall Restrictions Weight Bearing Restrictions: No      Mobility  Bed Mobility Overal bed mobility: Independent                Transfers Overall transfer level: Needs assistance Equipment used: 1 person hand held assist;None Transfers: Sit to/from Stand Sit to Stand: Supervision            Ambulation/Gait Ambulation/Gait assistance: Supervision Gait Distance (Feet): 120 Feet Assistive device: 1 person hand held assist;None Gait Pattern/deviations: Step-through pattern;Decreased stride length Gait velocity: Decreased Gait velocity interpretation: 1.31 - 2.62 ft/sec, indicative of limited community ambulator General Gait Details: Initial HHA for balance, progressing to no UE support with supervision for safety. DOE 2/4; SpO2 96% on RA  Stairs Stairs:  (Simulated ascending steps by high marching with single UE support on hand rail)          Wheelchair Mobility    Modified Rankin (Stroke Patients Only)       Balance Overall balance assessment: Needs assistance   Sitting balance-Leahy Scale: Good       Standing balance-Leahy Scale: Good                               Pertinent Vitals/Pain Pain Assessment: No/denies pain    Home Living Family/patient expects to be discharged to:: Private residence Living Arrangements: Alone Available Help at Discharge: Family;Available PRN/intermittently Type of Home: House Home Access: Stairs to enter Entrance Stairs-Rails: Right;Left;Can reach both Entrance Stairs-Number of Steps: 4 Home Layout: One level Home Equipment: Walker - 2 wheels;Shower seat;Hand held shower head      Prior Function Level of Independence: Independent               Hand Dominance        Extremity/Trunk Assessment   Upper Extremity Assessment Upper Extremity Assessment: Overall WFL for tasks assessed    Lower Extremity Assessment Lower Extremity Assessment: Overall WFL for tasks assessed    Cervical / Trunk Assessment Cervical / Trunk Assessment: Normal  Communication   Communication: No difficulties  Cognition Arousal/Alertness: Awake/alert Behavior During Therapy: WFL for tasks assessed/performed Overall Cognitive Status: Within Functional Limits for tasks assessed                                        General Comments General  comments (skin integrity, edema, etc.): Daughter present during session. Orthostatic BPs negative    Exercises     Assessment/Plan    PT Assessment Patient needs continued PT services  PT Problem List Decreased activity tolerance;Decreased balance;Decreased mobility       PT Treatment Interventions DME instruction;Gait training;Stair training;Functional mobility training;Therapeutic activities;Therapeutic exercise;Balance  training;Patient/family education    PT Goals (Current goals can be found in the Care Plan section)  Acute Rehab PT Goals Patient Stated Goal: Get back home PT Goal Formulation: With patient Time For Goal Achievement: 07/28/18 Potential to Achieve Goals: Good    Frequency Min 3X/week   Barriers to discharge        Co-evaluation               AM-PAC PT "6 Clicks" Daily Activity  Outcome Measure Difficulty turning over in bed (including adjusting bedclothes, sheets and blankets)?: None Difficulty moving from lying on back to sitting on the side of the bed? : None Difficulty sitting down on and standing up from a chair with arms (e.g., wheelchair, bedside commode, etc,.)?: A Little Help needed moving to and from a bed to chair (including a wheelchair)?: A Little Help needed walking in hospital room?: A Little Help needed climbing 3-5 steps with a railing? : A Little 6 Click Score: 20    End of Session Equipment Utilized During Treatment: Gait belt Activity Tolerance: Patient tolerated treatment well;Patient limited by fatigue Patient left: in bed;with call bell/phone within reach;with family/visitor present Nurse Communication: Mobility status PT Visit Diagnosis: Other abnormalities of gait and mobility (R26.89)    Time: 7048-8891 PT Time Calculation (min) (ACUTE ONLY): 12 min   Charges:   PT Evaluation $PT Eval Low Complexity: [place], PT, DPT Acute Rehab Services  Pager: [place] 07/14/2018, 4:55 PM

## 2018-07-14 NOTE — ED Notes (Signed)
Primary RN attempted to start IV x 2 with left arm restricted; IV team consult placed-Monique,RN

## 2018-07-15 DIAGNOSIS — D619 Aplastic anemia, unspecified: Secondary | ICD-10-CM | POA: Diagnosis present

## 2018-07-15 DIAGNOSIS — E8770 Fluid overload, unspecified: Secondary | ICD-10-CM | POA: Diagnosis present

## 2018-07-15 DIAGNOSIS — Z992 Dependence on renal dialysis: Secondary | ICD-10-CM | POA: Diagnosis not present

## 2018-07-15 DIAGNOSIS — E785 Hyperlipidemia, unspecified: Secondary | ICD-10-CM | POA: Diagnosis present

## 2018-07-15 DIAGNOSIS — I120 Hypertensive chronic kidney disease with stage 5 chronic kidney disease or end stage renal disease: Secondary | ICD-10-CM | POA: Diagnosis present

## 2018-07-15 DIAGNOSIS — Z90710 Acquired absence of both cervix and uterus: Secondary | ICD-10-CM | POA: Diagnosis not present

## 2018-07-15 DIAGNOSIS — J9601 Acute respiratory failure with hypoxia: Secondary | ICD-10-CM | POA: Diagnosis present

## 2018-07-15 DIAGNOSIS — D509 Iron deficiency anemia, unspecified: Secondary | ICD-10-CM | POA: Diagnosis present

## 2018-07-15 DIAGNOSIS — I509 Heart failure, unspecified: Secondary | ICD-10-CM | POA: Diagnosis not present

## 2018-07-15 DIAGNOSIS — N186 End stage renal disease: Secondary | ICD-10-CM | POA: Diagnosis present

## 2018-07-15 DIAGNOSIS — J9801 Acute bronchospasm: Secondary | ICD-10-CM | POA: Diagnosis present

## 2018-07-15 DIAGNOSIS — J90 Pleural effusion, not elsewhere classified: Secondary | ICD-10-CM | POA: Diagnosis not present

## 2018-07-15 DIAGNOSIS — F329 Major depressive disorder, single episode, unspecified: Secondary | ICD-10-CM | POA: Diagnosis present

## 2018-07-15 DIAGNOSIS — N184 Chronic kidney disease, stage 4 (severe): Secondary | ICD-10-CM | POA: Diagnosis not present

## 2018-07-15 DIAGNOSIS — E440 Moderate protein-calorie malnutrition: Secondary | ICD-10-CM | POA: Diagnosis present

## 2018-07-15 DIAGNOSIS — Z79899 Other long term (current) drug therapy: Secondary | ICD-10-CM | POA: Diagnosis not present

## 2018-07-15 DIAGNOSIS — K7030 Alcoholic cirrhosis of liver without ascites: Secondary | ICD-10-CM | POA: Diagnosis present

## 2018-07-15 DIAGNOSIS — E871 Hypo-osmolality and hyponatremia: Secondary | ICD-10-CM | POA: Diagnosis not present

## 2018-07-15 DIAGNOSIS — Z8505 Personal history of malignant neoplasm of liver: Secondary | ICD-10-CM | POA: Diagnosis not present

## 2018-07-15 DIAGNOSIS — D631 Anemia in chronic kidney disease: Secondary | ICD-10-CM | POA: Diagnosis present

## 2018-07-15 DIAGNOSIS — J810 Acute pulmonary edema: Secondary | ICD-10-CM | POA: Diagnosis present

## 2018-07-15 DIAGNOSIS — F419 Anxiety disorder, unspecified: Secondary | ICD-10-CM | POA: Diagnosis present

## 2018-07-15 DIAGNOSIS — F1011 Alcohol abuse, in remission: Secondary | ICD-10-CM | POA: Diagnosis present

## 2018-07-15 DIAGNOSIS — I129 Hypertensive chronic kidney disease with stage 1 through stage 4 chronic kidney disease, or unspecified chronic kidney disease: Secondary | ICD-10-CM | POA: Diagnosis not present

## 2018-07-15 LAB — BASIC METABOLIC PANEL
Anion gap: 9 (ref 5–15)
BUN: 40 mg/dL — AB (ref 8–23)
CALCIUM: 8.2 mg/dL — AB (ref 8.9–10.3)
CO2: 15 mmol/L — ABNORMAL LOW (ref 22–32)
Chloride: 98 mmol/L (ref 98–111)
Creatinine, Ser: 4.18 mg/dL — ABNORMAL HIGH (ref 0.44–1.00)
GFR calc Af Amer: 11 mL/min — ABNORMAL LOW (ref >60)
GFR, EST NON AFRICAN AMERICAN: 10 mL/min — AB (ref >60)
GLUCOSE: 99 mg/dL (ref 70–99)
Potassium: 4.6 mmol/L (ref 3.5–5.1)
SODIUM: 122 mmol/L — AB (ref 135–145)

## 2018-07-15 LAB — RETICULOCYTES
RBC.: 2.49 MIL/uL — ABNORMAL LOW (ref 3.87–5.11)
RETIC COUNT ABSOLUTE: 39.8 10*3/uL (ref 19.0–186.0)
Retic Ct Pct: 1.6 % (ref 0.4–3.1)

## 2018-07-15 LAB — VITAMIN B12: Vitamin B-12: 1854 pg/mL — ABNORMAL HIGH (ref 180–914)

## 2018-07-15 LAB — GLUCOSE, CAPILLARY
Glucose-Capillary: 91 mg/dL (ref 70–99)
Glucose-Capillary: 93 mg/dL (ref 70–99)
Glucose-Capillary: 102 mg/dL — ABNORMAL HIGH (ref 70–99)
Glucose-Capillary: 107 mg/dL — ABNORMAL HIGH (ref 70–99)

## 2018-07-15 LAB — IRON AND TIBC
Iron: 26 ug/dL — ABNORMAL LOW (ref 28–170)
Saturation Ratios: 12 % (ref 10.4–31.8)
TIBC: 209 ug/dL — ABNORMAL LOW (ref 250–450)
UIBC: 183 ug/dL

## 2018-07-15 LAB — FERRITIN: FERRITIN: 424 ng/mL — AB (ref 11–307)

## 2018-07-15 LAB — FOLATE: Folate: >100 ng/mL (ref >5.9)

## 2018-07-15 MED ORDER — SODIUM BICARBONATE 650 MG PO TABS: 650.0000 mg | ORAL_TABLET | Freq: Four times a day (QID) | ORAL | Status: DC

## 2018-07-15 MED ORDER — SODIUM CHLORIDE 0.9 % IV SOLN
INTRAVENOUS | Status: AC
  Administered 2018-07-15: 10:00:00 via INTRAVENOUS

## 2018-07-15 NOTE — Progress Notes (Signed)
Initial Nutrition Assessment  DOCUMENTATION CODES:   Non-severe (moderate) malnutrition in context of chronic illness  INTERVENTION:   - Continue Nepro Shake po TID, each supplement provides 425 kcal and 19 grams protein  - Recommend renal MVI daily  NUTRITION DIAGNOSIS:   Moderate Malnutrition related to poor appetite, chronic illness(ESRD, alcoholic liver cirrhosis) as evidenced by moderate fat depletion, severe fat depletion, moderate muscle depletion, severe muscle depletion.  GOAL:   Patient will meet greater than or equal to 90% of their needs  MONITOR:   PO intake, Supplement acceptance, I & O's, Labs, Weight trends  REASON FOR ASSESSMENT:   Malnutrition Screening Tool    ASSESSMENT:   72 year old female who presented to the ED with weakness and hypertension. PMH significant for ESRD not yet on HD, hypertension, alcoholic liver cirrhosis, hepatocellular cancer s/p surgery (no chemoradiation, now cancer-free), EtOH abuse in remession, hyperlipidemia, and chronic hyponatremia.  Spoke with pt at bedside who reports that her appetite is "so-so." Pt states that some days it is good while other days it is poor. Pt reports that this has been her baseline for "a while."  Pt reports eating 2 meals daily with snacks. Pt shares that she consumes 1 Ensure daily. A meal might include turnip greens, rolls, and macaroni and cheese. A snack may be a piece of fruit. Pt states that she cooks some and that her daughter and sister will also bring her food.  Pt states she ate well at lunch today and had pot roast and rice. RD encouraged adequate PO intake.  Pt agreeable to Nepro TID during admission. Pt prefers vanilla flavor.  Pt endorses issues chewing and reports that she currently does not have dentures because she was having "problems" with them. RD offered to order pt a mechanically chopped diet, but pt declined.  Pt endorses weight loss, stating that her UBW is "in the 130's." Pt  unsure when she last weighed this. Per weight history in chart, pt's weight has fluctuated between 109-121 lbs since 11/07/17. Noted pt with weight gain of 9 lbs since 05/21/18. Suspect weight fluctuations related in part to fluid status.  Meal Completion: 40-50%  Medications reviewed and include: 1000 unit vitamin D daily, Nepro TID, 1 mg folic acid daily  Labs reviewed: sodium 122 (L), CO2 15 (L), BUN 40 (H), creatinine 4.18 (H), iron 26 (L), ferritin 424 (H), hemoglobin 7.8 (L), HCT 23.5 (L)  NUTRITION - FOCUSED PHYSICAL EXAM:    Most Recent Value  Orbital Region  Moderate depletion  Upper Arm Region  Severe depletion  Thoracic and Lumbar Region  Moderate depletion  Buccal Region  Moderate depletion  Temple Region  Moderate depletion  Clavicle Bone Region  Moderate depletion  Clavicle and Acromion Bone Region  Severe depletion  Scapular Bone Region  Moderate depletion  Dorsal Hand  Moderate depletion  Patellar Region  Severe depletion  Anterior Thigh Region  Severe depletion  Posterior Calf Region  Severe depletion  Edema (RD Assessment)  Mild  Hair  Reviewed  Eyes  Reviewed  Mouth  Reviewed [no teeth]  Skin  Reviewed  Nails  Reviewed       Diet Order:   Diet Order           Diet renal with fluid restriction Fluid restriction: 1200 mL Fluid; Room service appropriate? Yes; Fluid consistency: Thin  Diet effective now          EDUCATION NEEDS:   No education needs have been identified at this  time  Skin:  Skin Assessment: Reviewed RN Assessment  Last BM:  07/14/18  Height:   Ht Readings from Last 1 Encounters:  07/13/18 5\' 3"  (1.6 m)    Weight:   Wt Readings from Last 1 Encounters:  07/15/18 118 lb 3.2 oz (53.6 kg)    Ideal Body Weight:  52.27 kg  BMI:  Body mass index is 20.94 kg/m.  Estimated Nutritional Needs:   Kcal:  1600-1800 kcal/day  Protein:  70-85 grams/day  Fluid:  UOP + 1000 ml    Gaynell Face, MS, RD, LDN Pager:  [phone] Weekend/After Hours: [phone]

## 2018-07-15 NOTE — Progress Notes (Signed)
PROGRESS NOTE    Sarah Harmon  RJJ:[id number] DOB: [birth year] DOA: 07/13/2018 PCP: Biagio Borg, MD   Brief Narrative:71 y.o. 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  Patient states that she has not been feeling well in the past several days, including generalized weakness, fatigue, decreased appetite and oral intake.  Denies unilateral weakness, numbness or tingling in her extremities.  She does not have chest pain, cough, fever or chills.  She states that she has mild shortness on exertion.  Denies nausea, vomiting, diarrhea, abdominal pain.  No symptoms of  UTI.  Patient also reports blood pressure is uncontrolled recently even though she is taking her prescribed blood pressure medications at home.  ED Course: pt was found to have sodium of 120, WBCs 3.6, INR 0.98, PTT 46, ammonia 26, potassium 4.4, bicarbonate 16, creatinine 3.94, BUN 32, temperature normal, bradycardia, tachypnea, oxygen saturation 97% on room air, temperature 97.7, blood pressure elevation to 221/72. Chest x-ray showed mild pulmonary edema and small bilateral pleural effusion.  Patient is placed on telemetry bed of observation.   Assessment & Plan:   Principal Problem:   Hyponatremia Active Problems:   Depression   Essential hypertension   Hypertensive urgency   Alcoholic cirrhosis of liver without ascites (HCC)   Hepatocellular carcinoma (HCC)   ESRD (end stage renal disease) (HCC)   Anemia due to end stage renal disease ([place])  1] acute on chronic hyponatremia patient lives alone.  Daughter reports that patient has poor poor p.o. intake.  Sodium increased to 122 and staying the  same yesterday and today.  She is awake alert oriented x3.  She walked with PT yesterday and they do not recommend any follow-up.  I discussed with Dr. Moshe Cipro with nephrology today.  I will start her on slow normal saline at 50 cc an hour for 10 hours and recheck labs tomorrow.  She has walked with PT and OT and they do not recommend any ongoing therapy.  2] hypertension with hypertensive urgency continue amlodipine clonidine and PRN hydralazine.  BP stable.  3] alcohol cirrhosis without ascites with normal mental status continue lactulose as she was taking at home.  4] hepatocellular carcinoma follow-up with oncology.  5] end-stage renal disease has a fistula placed in the left.  Patient follows up with Dr. Moshe Cipro.    DVT prophylaxis: SCD Code Status: Full code patient's daughter Family Communication patient's daughter:  Disposition Plan TBD Procedures: None Antimicrobials: None  Subjective: Resting in bed in no acute distress denies any chest pain shortness of breath cough nausea vomiting or diarrhea.   Objective: Vitals:   07/15/18 0024 07/15/18 0502 07/15/18 0508 07/15/18 0949  BP: 139/62 (!) 135/58  (!) 133/55  Pulse: (!) 56 (!) 55  62  Resp: 18 18    Temp: 98.2 F (36.8 C) 98.4 F (36.9 C)    TempSrc: Oral Oral    SpO2: 99% 99%    Weight:   53.6 kg (118 lb 3.2 oz)   Height:        Intake/Output Summary (Last 24 hours) at 07/15/2018 1014 Last data filed at 07/14/2018 2100 Gross per 24 hour  Intake 220 ml  Output 1 ml  Net 219 ml   [employer]  07/13/18 2003 07/14/18 0406 07/15/18 0508  Weight: 50.8 kg (112 lb) 53.8 kg (118 lb 11.2 oz) 53.6 kg (118 lb 3.2 oz)    Examination:  General exam: Appears calm and comfortable  Respiratory system: Clear to auscultation. Respiratory effort normal. Cardiovascular system: S1 & S2 heard, RRR. No JVD, murmurs, rubs, gallops or clicks. No pedal edema. Gastrointestinal system: Abdomen is nondistended, soft and  nontender. No organomegaly or masses felt. Normal bowel sounds heard. Central nervous system: Alert and oriented. No focal neurological deficits. Extremities: Symmetric 5 x 5 power. Skin: No rashes, lesions or ulcers Psychiatry: Judgement and insight appear normal. Mood & affect appropriate.     Data Reviewed: I have personally reviewed following labs and imaging studies  CBC: Recent Labs  Lab 07/13/18 2013 07/14/18 0702  WBC 3.6* 3.7*  HGB 8.4* 7.8*  HCT 26.2* 23.5*  MCV 88.5 86.4  PLT 155 811*   Basic Metabolic Panel: Recent Labs  Lab 07/13/18 2013 07/14/18 0702 07/15/18 0009  NA 120* 122* 122*  K 4.4 4.5 4.6  CL 95* 96* 98  CO2 16* 17* 15*  GLUCOSE 132* 105* 99  BUN 32* 36* 40*  CREATININE 3.94* 4.05* 4.18*  CALCIUM 8.7* 8.6* 8.2*   GFR: Estimated Creatinine Clearance: 9.9 mL/min (A) (by C-G formula based on SCr of 4.18 mg/dL (H)). Liver Function Tests: Recent Labs  Lab 07/14/18 0124  AST 28  ALT 10  ALKPHOS 46  BILITOT 0.9  PROT 7.2  ALBUMIN 2.9*   No results for input(s): LIPASE, AMYLASE in the last 168 hours. Recent Labs  Lab 07/14/18 0124  AMMONIA 26   Coagulation Profile: Recent Labs  Lab 07/14/18 0124  INR 0.98   Cardiac Enzymes: No results for input(s): CKTOTAL, CKMB, CKMBINDEX, TROPONINI in the last 168 hours. BNP (last 3 results) No results for input(s): PROBNP in the last 8760 hours. HbA1C: No results for input(s): HGBA1C in the last 72 hours. CBG: Recent Labs  Lab 07/15/18 0746  GLUCAP 91   Lipid Profile: No results for input(s): CHOL, HDL, LDLCALC, TRIG, CHOLHDL, LDLDIRECT in the last 72 hours. Thyroid Function Tests: Recent Labs    07/14/18 0702  TSH 1.123   Anemia Panel: Recent Labs    07/15/18 0009  VITAMINB12 1,854*  FOLATE >100.0  FERRITIN 424*  TIBC 209*  IRON 26*  RETICCTPCT 1.6   Sepsis Labs: No results for input(s): PROCALCITON, LATICACIDVEN in the last 168 hours.  No results found for this or any  previous visit (from the past 240 hour(s)).       Radiology Studies: Dg Chest 2 View  Result Date: 07/13/2018 CLINICAL DATA:  Chest pain, weakness EXAM: CHEST - 2 VIEW COMPARISON:  09/10/2017 FINDINGS: There are small to moderate bilateral pleural effusions with bibasilar atelectasis. Diffuse interstitial opacities throughout the lungs, likely edema. Mild cardiomegaly. IMPRESSION: Mild pulmonary edema. Small to moderate bilateral effusions with bibasilar atelectasis. Electronically Signed   By: Rolm Baptise M.D.   On: 07/13/2018 20:30        Scheduled Meds: . ALPRAZolam  0.5 mg Oral QHS  . amLODipine  10 mg Oral Daily  . cholecalciferol  1,000 Units Oral Daily  . cloNIDine  0.1 mg Oral TID  . feeding supplement (NEPRO CARB STEADY)  237 mL Oral TID BM  . folic acid  1 mg Oral Daily  . hydrALAZINE  100 mg Oral TID  . hydroxypropyl methylcellulose / hypromellose  1 drop Both Eyes Daily  . ketotifen  1 drop Both Eyes BID  . lactulose  20 g Oral Daily  . propranolol  40 mg Oral BID   Continuous Infusions: . sodium chloride Stopped (07/14/18 0700)  . sodium chloride 50 mL/hr at 07/15/18 1008     LOS: 0 days     Georgette Shell, MD [facility]  If 7PM-7AM, please contact night-coverage [email or web address] Password [email or web address] 07/15/2018, 10:14 AM

## 2018-07-15 NOTE — Evaluation (Addendum)
Occupational Therapy Evaluation Patient Details Name: Sarah Harmon MRN: [id number] DOB: [birth year] Today's Date: 07/15/2018    History of Present Illness Pt is a 72 y.o. female admitted 07/13/18 with generalized weakness; worked up for hyponatremia and fluid overload. PMH includes HTN, alcoholic cirrhosis, hepatocellular carcinoma, ESRD, depression.   Clinical Impression   PTA, pt was living alone and was performing ADLs and simple IADLs; pt's daughter performing grocery shopping and driving. Pt currently performing ADLs and functional mobility at supervision Min Guard level. Pt presenting with decreased activity tolerance and reporting fatigued after hand hygiene at sink and ST functional mobility stating "I need to sit down." Pt denies dizziness. Pt would benefit from further acute OT to increased activity tolerance and safety during ADLs/IADLs. Recommend dc home once medically stable per physician.     Follow Up Recommendations  No OT follow up;Supervision - Intermittent    Equipment Recommendations  None recommended by OT    Recommendations for Other Services PT consult     Precautions / Restrictions Precautions Precautions: Fall Restrictions Weight Bearing Restrictions: No      Mobility Bed Mobility Overal bed mobility: Independent                Transfers Overall transfer level: Needs assistance Equipment used: None Transfers: Sit to/from Stand Sit to Stand: Supervision;Min guard         General transfer comment: Supervision-min Guard A for safety. Pt demonstrating slow movements when standing.     Balance Overall balance assessment: Needs assistance Sitting-balance support: No upper extremity supported;Feet supported Sitting balance-Leahy Scale: Good     Standing balance support: No upper extremity supported;During functional activity Standing balance-Leahy Scale: Good                             ADL either performed or assessed with  clinical judgement   ADL Overall ADL's : Needs assistance/impaired Eating/Feeding: Set up;Sitting   Grooming: Supervision/safety;Standing;Wash/dry hands;Wash/dry face   Upper Body Bathing: Supervision/ safety;Sitting   Lower Body Bathing: Supervison/ safety;Sit to/from stand   Upper Body Dressing : Supervision/safety;Sitting   Lower Body Dressing: Supervision/safety;Sit to/from stand   Toilet Transfer: Min guard;Ambulation(Simulated to recliner)           Functional mobility during ADLs: Min guard General ADL Comments: Pt performing ADLs and fucntional mobility at [employer] level. Pt presenting with decreased activity tolerance and becoming fatigued after hand hygiene at sink and ST mobility to/from sink. Pt moving slowly and cauteously throughout session     Vision         Perception     Praxis      Pertinent Vitals/Pain Pain Assessment: No/denies pain     Hand Dominance Right   Extremity/Trunk Assessment Upper Extremity Assessment Upper Extremity Assessment: Overall WFL for tasks assessed   Lower Extremity Assessment Lower Extremity Assessment: Overall WFL for tasks assessed   Cervical / Trunk Assessment Cervical / Trunk Assessment: Normal   Communication Communication Communication: No difficulties   Cognition Arousal/Alertness: Awake/alert Behavior During Therapy: WFL for tasks assessed/performed Overall Cognitive Status: Impaired/Different from baseline Area of Impairment: Attention;Following commands;Problem solving                   Current Attention Level: Selective   Following Commands: Follows one step commands inconsistently     Problem Solving: Requires verbal cues General Comments: Pt requiring increased cues to follow commands during session. For example, pt walking  towards door of room after beinging ask to go to sink for hand hygiene. Pt requiring cues to recall to wash hands. Pt also with difficulty answering some  questions such as who do you live with at home, pt answered "I don't know. I don't know when they will let me go home." Unsure if this is baseline cognition and will further assess.   General Comments  Provided Vanilla Nepro per pt request; RN notified.    Exercises     Shoulder Instructions      Home Living Family/patient expects to be discharged to:: Private residence Living Arrangements: Alone Available Help at Discharge: Family;Available PRN/intermittently Type of Home: House Home Access: Stairs to enter [employer] of Steps: 4 Entrance Stairs-Rails: Right;Left;Can reach both Home Layout: One level     Bathroom Shower/Tub: Tub/shower unit;Curtain   Bathroom Toilet: Standard Bathroom Accessibility: Yes   Home Equipment: Environmental consultant - 2 wheels;Shower seat;Hand held shower head          Prior Functioning/Environment Level of Independence: Independent        Comments: Daughter does grocery shopping        OT Problem List: Decreased strength;Decreased activity tolerance;Cardiopulmonary status limiting activity      OT Treatment/Interventions: Self-care/ADL training;Therapeutic exercise;Energy conservation;DME and/or AE instruction;Therapeutic activities;Patient/family education    OT Goals(Current goals can be found in the care plan section) Acute Rehab OT Goals Patient Stated Goal: "Go home" OT Goal Formulation: With patient Time For Goal Achievement: 07/29/18 Potential to Achieve Goals: Good ADL Goals Pt Will Perform Grooming: with modified independence;standing Pt Will Perform Lower Body Dressing: with modified independence;sit to/from stand Pt Will Transfer to Toilet: with modified independence;ambulating;regular height toilet Additional ADL Goal #1: Pt will demonstrate increased activity tolerance to perform 3-4 ADLs in standing Additional ADL Goal #2: Pt will verbalize three energy conseravtion techniques for ADLs/IADLs  OT Frequency: Min 2X/week    Barriers to D/C:            Co-evaluation              AM-PAC PT "6 Clicks" Daily Activity     Outcome Measure Help from another person eating meals?: None Help from another person taking care of personal grooming?: None Help from another person toileting, which includes using toliet, bedpan, or urinal?: A Little Help from another person bathing (including washing, rinsing, drying)?: None Help from another person to put on and taking off regular upper body clothing?: None Help from another person to put on and taking off regular lower body clothing?: None 6 Click Score: 23   End of Session Nurse Communication: Mobility status  Activity Tolerance: Patient tolerated treatment well Patient left: in chair;with call bell/phone within reach  OT Visit Diagnosis: Unsteadiness on feet (R26.81);Other abnormalities of gait and mobility (R26.89);Muscle weakness (generalized) (M62.81)                Time: 2500-3704 OT Time Calculation (min): 13 min Charges:  OT General Charges $OT Visit: 1 Visit OT Evaluation $OT Eval Low Complexity: [place], OTR/L Acute Rehab Pager: [phone] Office: [place] 07/15/2018, 8:30 AM

## 2018-07-16 ENCOUNTER — Inpatient Hospital Stay (HOSPITAL_COMMUNITY): Payer: Medicare Other

## 2018-07-16 DIAGNOSIS — E440 Moderate protein-calorie malnutrition: Secondary | ICD-10-CM | POA: Diagnosis present

## 2018-07-16 LAB — RENAL FUNCTION PANEL
ALBUMIN: 2.6 g/dL — AB (ref 3.5–5.0)
ANION GAP: 10 (ref 5–15)
BUN: 43 mg/dL — ABNORMAL HIGH (ref 8–23)
CO2: 16 mmol/L — ABNORMAL LOW (ref 22–32)
Calcium: 8.2 mg/dL — ABNORMAL LOW (ref 8.9–10.3)
Chloride: 95 mmol/L — ABNORMAL LOW (ref 98–111)
Creatinine, Ser: 4.42 mg/dL — ABNORMAL HIGH (ref 0.44–1.00)
GFR, EST AFRICAN AMERICAN: 10 mL/min — AB (ref >60)
GFR, EST NON AFRICAN AMERICAN: 9 mL/min — AB (ref >60)
Glucose, Bld: 116 mg/dL — ABNORMAL HIGH (ref 70–99)
PHOSPHORUS: 4.6 mg/dL (ref 2.5–4.6)
POTASSIUM: 4 mmol/L (ref 3.5–5.1)
Sodium: 121 mmol/L — ABNORMAL LOW (ref 135–145)

## 2018-07-16 LAB — BASIC METABOLIC PANEL
Anion gap: 9 (ref 5–15)
BUN: 43 mg/dL — ABNORMAL HIGH (ref 8–23)
CHLORIDE: 96 mmol/L — AB (ref 98–111)
CO2: 17 mmol/L — ABNORMAL LOW (ref 22–32)
Calcium: 8.3 mg/dL — ABNORMAL LOW (ref 8.9–10.3)
Creatinine, Ser: 4.37 mg/dL — ABNORMAL HIGH (ref 0.44–1.00)
GFR calc Af Amer: 11 mL/min — ABNORMAL LOW (ref >60)
GFR calc non Af Amer: 9 mL/min — ABNORMAL LOW (ref >60)
Glucose, Bld: 88 mg/dL (ref 70–99)
Potassium: 4.2 mmol/L (ref 3.5–5.1)
Sodium: 122 mmol/L — ABNORMAL LOW (ref 135–145)

## 2018-07-16 LAB — GLUCOSE, CAPILLARY: Glucose-Capillary: 93 mg/dL (ref 70–99)

## 2018-07-16 LAB — CBC
HCT: 21.4 % — ABNORMAL LOW (ref 36.0–46.0)
HEMOGLOBIN: 7.2 g/dL — AB (ref 12.0–15.0)
MCH: 28.5 pg (ref 26.0–34.0)
MCHC: 33.6 g/dL (ref 30.0–36.0)
MCV: 84.6 fL (ref 78.0–100.0)
Platelets: 127 10*3/uL — ABNORMAL LOW (ref 150–400)
RBC: 2.53 MIL/uL — ABNORMAL LOW (ref 3.87–5.11)
RDW: 14.2 % (ref 11.5–15.5)
WBC: 4.1 10*3/uL (ref 4.0–10.5)

## 2018-07-16 LAB — MRSA PCR SCREENING: MRSA by PCR: NEGATIVE

## 2018-07-16 MED ORDER — SODIUM CHLORIDE 0.9 % IV SOLN
125.0000 mg | Freq: Every day | INTRAVENOUS | Status: AC
  Administered 2018-07-16 – 2018-07-21 (×6): 125 mg via INTRAVENOUS
  Filled 2018-07-16 (×11): qty 10

## 2018-07-16 MED ORDER — FUROSEMIDE 10 MG/ML IJ SOLN
INTRAMUSCULAR | Status: AC
  Filled 2018-07-16: qty 6

## 2018-07-16 MED ORDER — DARBEPOETIN ALFA 100 MCG/0.5ML IJ SOSY: 100.0000 ug | PREFILLED_SYRINGE | INTRAMUSCULAR | Status: DC

## 2018-07-16 MED ORDER — FUROSEMIDE 10 MG/ML IJ SOLN
60.0000 mg | Freq: Once | INTRAMUSCULAR | Status: AC
  Administered 2018-07-16: 60 mg via INTRAVENOUS

## 2018-07-16 MED ORDER — CHLORHEXIDINE GLUCONATE CLOTH 2 % EX PADS: 6.0000 | MEDICATED_PAD | Freq: Every day | CUTANEOUS | Status: DC

## 2018-07-16 NOTE — Consult Note (Signed)
[facility] Renal Consultation Note  Requesting MD: Rodena Piety  Indication for Consultation: A on CRF- hyponatremia  HPI:  Sarah Harmon is a 72 y.o. 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  Regarding her kidneys she has some mild CKD with creatinine ranging between 1.4 and 1.9 from 2016-2018.  She then had a hospitalization in late 2018 for bradycardia and hyponatremia with acute kidney injury creatinine of 3.3.  Patient's sodium improved during the hospitalization but she was left with advanced CKD.  I have been following her as an outpatient, preparing her for eventual dialysis.  She had a hospitalization in May for transient hyperkalemia at which time she had an AV graft placed.  It had to be declotted once but is now patent.  I saw her in the office on 7/19 where she did not appear to be overly uremic but was losing weight.  Shortly thereafter daughter noted a very high blood pressure and patient feeling weak so brought her to the hospital.  The main feature of this hospitalization has been hyponatremia.  In addition her kidney function has worsened.  They contacted me yesterday about the hyponatremia and they felt that she was not volume overloaded.  I felt it was okay to give her fluids but now she is symptomatic with volume overload.  She looks much more fatigued since the time I saw her last.  Given the events over the last Avril months I explained to the patient and her daughter I think the best course of action is to initiate dialysis to stabilize her medical situation.  They are in agreement  Creatinine  Date/Time Value Ref Range Status  10/31/2017 09:03 AM 3.2 (HH) 0.6 - 1.1 mg/dL Final  04/30/2017 09:29 AM 1.9 (H) 0.6 - 1.1 mg/dL Final  05/16/2016 10:27 AM 1.3 (H) 0.6 - 1.1 mg/dL Final  09/16/2015 09:19 AM 1.3 (H) 0.6 - 1.1 mg/dL Final   Creat  Date/Time Value Ref Range Status  09/02/2016 08:38 AM  1.64 (H) 0.60 - 0.93 mg/dL Final    Comment:      For patients > or = 74 years of age: The upper reference limit for Creatinine is approximately 13% higher for people identified as African-American.     01/15/2016 08:20 AM 1.24 (H) 0.60 - 0.93 mg/dL Final  07/05/2015 08:03 AM 1.43 (H) 0.50 - 1.10 mg/dL Final   Creatinine, Ser  Date/Time Value Ref Range Status  07/16/2018 05:49 AM 4.37 (H) 0.44 - 1.00 mg/dL Final  07/15/2018 12:09 AM 4.18 (H) 0.44 - 1.00 mg/dL Final  07/14/2018 07:02 AM 4.05 (H) 0.44 - 1.00 mg/dL Final  07/13/2018 08:13 PM 3.94 (H) 0.44 - 1.00 mg/dL Final  05/21/2018 06:39 AM 3.36 (H) 0.44 - 1.00 mg/dL Final  05/09/2018 05:21 AM 3.73 (H) 0.44 - 1.00 mg/dL Final  05/08/2018 05:09 AM 3.92 (H) 0.44 - 1.00 mg/dL Final  05/07/2018 08:52 AM 3.56 (H) 0.44 - 1.00 mg/dL Final  05/07/2018 03:09 AM 3.61 (H) 0.44 - 1.00 mg/dL Final  05/06/2018 07:23 PM 3.67 (H) 0.44 - 1.00 mg/dL Final  02/07/2018 07:52 AM 2.94 (H) 0.60 - 1.10 mg/dL Final  10/31/2017 10:09 AM 3.20 (H) 0.44 - 1.00 mg/dL Final  09/17/2017 06:33 AM 3.28 (H) 0.44 - 1.00 mg/dL Final  09/16/2017 09:29 AM 3.44 (H) 0.44 - 1.00 mg/dL Final  09/16/2017 05:25 AM 3.34 (H) 0.44 - 1.00 mg/dL Final  09/15/2017 10:07 AM 3.33 (H) 0.44 - 1.00 mg/dL Final  09/14/2017 01:53 PM 3.47 (H) 0.44 - 1.00 mg/dL Final  09/14/2017 03:43 AM 3.43 (H) 0.44 - 1.00 mg/dL Final  09/12/2017 03:52 AM 2.85 (H) 0.44 - 1.00 mg/dL Final  09/11/2017 08:01 AM 2.81 (H) 0.44 - 1.00 mg/dL Final  09/10/2017 08:47 PM 3.07 (H) 0.44 - 1.00 mg/dL Final  09/10/2017 05:54 PM 2.98 (H) 0.44 - 1.00 mg/dL Final  09/10/2017 03:41 PM 2.98 (H) 0.44 - 1.00 mg/dL Final  09/10/2017 08:52 AM 3.10 (H) 0.44 - 1.00 mg/dL Final  09/09/2017 11:52 PM 3.30 (H) 0.44 - 1.00 mg/dL Final  09/09/2017 11:43 PM 3.27 (H) 0.44 - 1.00 mg/dL Final  09/04/2017 09:43 AM 2.28 (H) 0.40 - 1.20 mg/dL Final  02/28/2017 09:44 AM 1.72 (H) 0.40 - 1.20 mg/dL Final  03/17/2016 08:57 AM 1.40 (H) 0.40  - 1.20 mg/dL Final  11/10/2015 12:18 PM 1.20 0.40 - 1.20 mg/dL Final  05/15/2015 04:20 AM 1.23 (H) 0.44 - 1.00 mg/dL Final  05/10/2015 08:30 AM 1.46 (H) 0.44 - 1.00 mg/dL Final  03/09/2015 11:55 AM 1.69 (H) 0.40 - 1.20 mg/dL Final  03/07/2015 04:05 AM 1.16 (H) 0.50 - 1.10 mg/dL Final  03/06/2015 04:14 AM 1.35 (H) 0.50 - 1.10 mg/dL Final  03/05/2015 06:11 AM 1.56 (H) 0.50 - 1.10 mg/dL Final  03/04/2015 07:52 PM 1.93 (H) 0.50 - 1.10 mg/dL Final  03/02/2015 12:40 AM 1.56 (H) 0.50 - 1.10 mg/dL Final  03/01/2015 03:52 AM 1.40 (H) 0.50 - 1.10 mg/dL Final  02/28/2015 03:35 AM 1.37 (H) 0.50 - 1.10 mg/dL Final  02/27/2015 11:44 AM 1.38 (H) 0.50 - 1.10 mg/dL Final  02/27/2015 07:07 AM 1.30 (H) 0.50 - 1.10 mg/dL Final  02/27/2015 03:10 AM 1.26 (H) 0.50 - 1.10 mg/dL Final  02/27/2015 12:43 AM 1.02 0.50 - 1.10 mg/dL Final  02/26/2015 04:10 PM 1.20 (H) 0.50 - 1.10 mg/dL Final  02/26/2015 10:43 AM 1.17 0.40 - 1.20 mg/dL Final  07/29/2013 11:55 AM 1.1 0.4 - 1.2 mg/dL Final  12/27/2012 08:59 AM 1.1 0.4 - 1.2 mg/dL Final  06/25/2012 08:57 AM 1.2 0.4 - 1.2 mg/dL Final  12/26/2011 09:25 AM 1.3 (H) 0.4 - 1.2 mg/dL Final  06/27/2011 09:10 AM 1.4 (H) 0.4 - 1.2 mg/dL Final  12/23/2010 11:29 AM 1.3 (H) 0.4 - 1.2 mg/dL Final     PMHx:   Past Medical History:  Diagnosis Date  . ALCOHOL ABUSE 07/13/2010  . ANXIETY 12/22/2009  . Cancer [facility])    liver cancer  . DEPRESSION 12/22/2009  . 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 DJD (degenerative joint disease), cervical    patient denies on preop of 05/10/15   . FATIGUE 12/23/2010  . GALLSTONES 12/22/2009  . Gallstones   . Headache    occasional   . HYPERLIPIDEMIA 12/22/2009  . HYPERTENSION 12/22/2009  . HYPONATREMIA 12/22/2009  . 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 OSTEOPENIA 12/22/2009  . Pancytopenia 07/13/2010  . RENAL CYST, LEFT 12/22/2009  . VITAMIN D DEFICIENCY 12/23/2010  . WEIGHT LOSS 12/23/2010    Past Surgical History:  Procedure Laterality Date  .  ABDOMINAL HYSTERECTOMY    . AV FISTULA PLACEMENT Left 05/21/2018   Procedure: INSERTION OF ARTERIOVENOUS (AV) GORE-TEX GRAFT UPPER ARM;  Surgeon: Elam Dutch, MD;  Location[facility];  Service: Vascular;  Laterality: Left;  . AV FISTULA PLACEMENT Left 06/03/2018   Procedure: INSERTION OF ARTERIOVENOUS (AV) GORE-TEX 64m x 10cm GRAFT INTO LEFT ARM;  Surgeon: FElam Dutch MD;  [place] [facility]  Service: Vascular;  Laterality:  Left;  . IR GENERIC HISTORICAL  10/18/2016   IR RADIOLOGIST EVAL & MGMT 10/18/2016 [name], MD [facility] INTERV RAD  . IR RADIOLOGIST EVAL & MGMT  05/30/2017  . surgery for liver ca    . THROMBECTOMY AND REVISION OF ARTERIOVENTOUS (AV) GORETEX  GRAFT Left 06/03/2018   Procedure: THROMBECTOMY AND REVISION OF ARTERIOVENTOUS (AV) GORETEX  GRAFT ARM;  Surgeon: Elam Dutch, MD;  Location: [facility] OR;  Service: Vascular;  Laterality: Left;    Family Hx:  Family History  Problem Relation Age of Onset  . Lung cancer Brother   . Breast cancer Other   . Breast cancer Sister   . Gastric cancer Sister   . Lung cancer Sister   . Colon cancer Neg Hx   . Colon polyps Neg Hx   . Rectal cancer Neg Hx   . Stomach cancer Neg Hx   . Esophageal cancer Neg Hx     Social History:  reports that she has never smoked. She has never used smokeless tobacco. She reports that she drinks about 1.8 - 2.4 oz of alcohol per week. She reports that she does not use drugs.  Allergies: No Known Allergies  Medications: Prior to Admission medications   Medication Sig Start Date End Date Taking? Authorizing Provider  ALPRAZolam Duanne Moron) 0.5 MG tablet TAKE 1 TABLET BY MOUTH ONCE DAILY AT BEDTIME 06/25/18  Yes Biagio Borg, MD  amLODipine (NORVASC) 10 MG tablet TAKE 1 TABLET BY MOUTH ONCE DAILY 03/11/18  Yes Biagio Borg, MD  Cholecalciferol (VITAMIN D3) 1000 UNITS CAPS Take 1,000 Units by mouth daily.    Yes [provider]  ENSURE (ENSURE) Take 237 mLs by mouth daily.    Yes [provider]  folic acid (FOLVITE) 1 MG tablet TAKE 1 TABLET BY MOUTH ONCE DAILY 04/16/18  Yes Biagio Borg, MD  hydrALAZINE (APRESOLINE) 100 MG tablet TAKE 1 TABLET BY MOUTH THREE TIMES DAILY 04/09/18  Yes Biagio Borg, MD  ketotifen (ZADITOR) 0.025 % ophthalmic solution Place 1 drop into both eyes 2 (two) times daily.   Yes [provider]  lactulose (CHRONULAC) 10 GM/15ML solution Take 30 mLs (20 g total) by mouth daily. 09/17/17  Yes Biagio Borg, MD  propranolol (INDERAL) 40 MG tablet Take 1 tablet (40 mg total) by mouth 2 (two) times daily. 09/04/17  Yes Biagio Borg, MD  Propylene Glycol (SYSTANE BALANCE) 0.6 % SOLN Place 1 drop into both eyes daily.   Yes [provider]  citalopram (CELEXA) 20 MG tablet TAKE ONE-HALF TABLET BY MOUTH ONCE DAILY Patient not taking: Reported on 06/27/2018 02/14/18   Biagio Borg, MD  cloNIDine (CATAPRES) 0.1 MG tablet Take 1 tablet (0.1 mg total) by mouth daily. Patient not taking: Reported on 05/21/2018 09/18/17   Hosie Poisson, MD  furosemide (LASIX) 40 MG tablet Take 1 tablet (40 mg total) by mouth daily. Patient not taking: Reported on 05/21/2018 09/18/17   Hosie Poisson, MD  HYDROcodone-acetaminophen (NORCO) 5-325 MG tablet Take 1 tablet by mouth every 6 (six) hours as needed for moderate pain. Patient not taking: Reported on 06/27/2018 06/03/18   Ulyses Amor, PA-C  isosorbide dinitrate (ISORDIL) 30 MG tablet Take 1 tablet (30 mg total) by mouth 3 (three) times daily. Patient not taking: Reported on 07/14/2018 09/17/17   Hosie Poisson, MD  Nutritional Supplements (FEEDING SUPPLEMENT, NEPRO CARB STEADY,) LIQD Take 237 mLs by mouth 2 (two) times daily between meals. Patient not  taking: Reported on 07/14/2018 05/09/18   Johnsie Cancel, NP    I have reviewed the patient's current medications.  Labs:  Results for orders placed or performed during the hospital encounter of 07/13/18 (from the past 48 hour(s))  Vitamin B12     Status: Abnormal    Collection Time: 07/15/18 12:09 AM  Result Value Ref Range   Vitamin B-12 1,854 (H) 180 - 914 pg/mL    Comment: (NOTE) This assay is not validated for testing neonatal or myeloproliferative syndrome specimens for Vitamin B12 levels. Performed at [facility], [place] [address], [place], [place] [address]   Folate     Status: None   Collection Time: 07/15/18 12:09 AM  Result Value Ref Range   Folate >100.0 >5.9 ng/mL    Comment: RESULTS CONFIRMED BY MANUAL DILUTION Performed at [facility], [place] [address] [place], [place] [address]   Iron and TIBC     Status: Abnormal   Collection Time: 07/15/18 12:09 AM  Result Value Ref Range   Iron 26 (L) 28 - 170 ug/dL   TIBC 209 (L) 250 - 450 ug/dL   Saturation Ratios 12 10.4 - 31.8 %   UIBC 183 ug/dL    Comment: Performed at [facility], [place] [address] [place], [place] [address]  Ferritin     Status: Abnormal   Collection Time: 07/15/18 12:09 AM  Result Value Ref Range   Ferritin 424 (H) 11 - 307 ng/mL    Comment: Performed at [facility], [place] [address], [place], [place] [address]  Reticulocytes     Status: Abnormal   Collection Time: 07/15/18 12:09 AM  Result Value Ref Range   Retic Ct Pct 1.6 0.4 - 3.1 %   RBC. 2.49 (L) 3.87 - 5.11 MIL/uL   Retic Count, Absolute 39.8 19.0 - 186.0 K/uL    Comment: Performed at [facility] [address], [place], [place] [address]  Basic metabolic panel     Status: Abnormal   Collection Time: 07/15/18 12:09 AM  Result Value Ref Range   Sodium 122 (L) 135 - 145 mmol/L   Potassium 4.6 3.5 - 5.1 mmol/L   Chloride 98 98 - 111 mmol/L   CO2 15 (L) 22 - 32 mmol/L   Glucose, Bld 99 70 - 99 mg/dL   BUN 40 (H) 8 - 23 mg/dL   Creatinine, Ser 4.18 (H) 0.44 - 1.00 mg/dL   Calcium 8.2 (L) 8.9 - 10.3 mg/dL   GFR calc non Af Amer 10 (L) >60 mL/min   GFR calc Af Amer 11 (L) >60 mL/min    Comment: (NOTE) The eGFR has been calculated using the CKD EPI equation. This  calculation has not been validated in all clinical situations. eGFR's persistently <60 mL/min signify possible Chronic Kidney Disease.    Anion gap 9 5 - 15    Comment: Performed at [facility] [address], [place],  [address]  Glucose, capillary     Status: None   Collection Time: 07/15/18  7:46 AM  Result Value Ref Range   Glucose-Capillary 91 70 - 99 mg/dL  Glucose, capillary     Status: None   Collection Time: 07/15/18 11:03 AM  Result Value Ref Range   Glucose-Capillary 93 70 - 99 mg/dL  Glucose, capillary     Status: Abnormal   Collection Time: 07/15/18  4:28 PM  Result Value Ref Range   Glucose-Capillary 102 (  H) 70 - 99 mg/dL  Glucose, capillary     Status: Abnormal   Collection Time: 07/15/18  9:11 PM  Result Value Ref Range   Glucose-Capillary 107 (H) 70 - 99 mg/dL  Basic metabolic panel     Status: Abnormal   Collection Time: 07/16/18  5:49 AM  Result Value Ref Range   Sodium 122 (L) 135 - 145 mmol/L   Potassium 4.2 3.5 - 5.1 mmol/L   Chloride 96 (L) 98 - 111 mmol/L   CO2 17 (L) 22 - 32 mmol/L   Glucose, Bld 88 70 - 99 mg/dL   BUN 43 (H) 8 - 23 mg/dL   Creatinine, Ser 4.37 (H) 0.44 - 1.00 mg/dL   Calcium 8.3 (L) 8.9 - 10.3 mg/dL   GFR calc non Af Amer 9 (L) >60 mL/min   GFR calc Af Amer 11 (L) >60 mL/min    Comment: (NOTE) The eGFR has been calculated using the CKD EPI equation. This calculation has not been validated in all clinical situations. eGFR's persistently <60 mL/min signify possible Chronic Kidney Disease.    Anion gap 9 5 - 15    Comment: Performed at [facility] [address], [place], [place] [address]  Glucose, capillary     Status: None   Collection Time: 07/16/18  8:04 AM  Result Value Ref Range   Glucose-Capillary 93 70 - 99 mg/dL   Comment 1 Notify RN   MRSA PCR Screening     Status: None   Collection Time: 07/16/18 10:41 AM  Result Value Ref Range   MRSA by PCR NEGATIVE NEGATIVE    Comment:        The  GeneXpert MRSA Assay (FDA approved for NASAL specimens only), is one component of a comprehensive MRSA colonization surveillance program. It is not intended to diagnose MRSA infection nor to guide or monitor treatment for MRSA infections. Performed at [facility], [place] [address], [place], [place] [address]      ROS:  A comprehensive review of systems was negative except for: Constitutional: positive for fatigue and weight loss Cardiovascular: positive for lower extremity edema and orthopnea Gastrointestinal: positive for Decreased appetite  Physical Exam: Vitals:   07/16/18 0903 07/16/18 1041  BP: (!) 176/59 (!) 174/56  Pulse: (!) 59   Resp: (!) 25 (!) 21  Temp:  98.1 F (36.7 C)  SpO2: 100% 97%     General: Thin black female, periorbital edema-lying in bed HEENT: Pupils are equally round reactive to light, extraocular motions are intact, mucous membranes are moist Neck: Positive for JVD  Heart: Borderline bradycardic Lungs: Decreased breath sounds at the bases Abdomen: Slightly distended, nontender Extremities: Pitting edema-left upper arm AV graft is patent Skin: Warm and dry Neuro: Alert.  Soft spoken.  Nonfocal  Assessment/Plan: 72 year old black female with cirrhosis and advancing CKD.  Admitted with weakness, very high blood pressure-has had hyponatremia not corrected with fluids and worsening CKD 1.Renal-advanced CKD as outpatient,status post AV graft with revision 6 weeks ago.  Patient's course of late has featured hospitalizations hyponatremia and weight loss.  Even though there are not acute indications for dialysis I feel there are chronic ones.  I feel the initiation of dialysis may stabilize her medical condition.  I have discussed it with the patient and her daughter and they are willing.  First 2-hour dialysis treatment via AV graft today with 17-gauge needles- likely second tomorrow if AVG does well 2. Hypertension/volume  -patient with history  of  malignant hypertension.  At this time she appears volume overloaded which is not helping blood pressure control.  Right now on Norvasc 10, clonidine 0.1 3 times daily, hydralazine 100 3 times daily and Inderal- will add volume removal into regimen and wean meds if able  3.  Malnutrition-patient has been losing weight and has very little to spare-last weight as outpatient was 112 pounds 4. Anemia  -hemoglobin been very low.  Iron stores low as well-last hemoglobin in outpatient was over 11-will reinitiate iron and ESA 5.  Hyponatremia-now I think is secondary to volume overload.  Dialysis should correct   Garey Alleva A 07/16/2018, 1:40 PM

## 2018-07-16 NOTE — Progress Notes (Signed)
Attempted to call report to 6E where patient will be transferred to.  Nurse Ebony Hail will call this nurse back to get report. Marcille Blanco, RN

## 2018-07-16 NOTE — Progress Notes (Signed)
Physical Therapy Treatment Patient Details Name: Sarah Harmon MRN: [id number] DOB: 1946-04-12 Today's Date: 07/16/2018    History of Present Illness Pt is a 72 y.o. female admitted 07/13/18 with generalized weakness; worked up for hyponatremia and fluid overload. PMH includes HTN, alcoholic cirrhosis, hepatocellular carcinoma, ESRD, depression.    PT Comments    Pt very pleasant and willing to mobilize. Pt able to perform transitions without assist with noted wheezing with limited gait and SpO2 decrease (see gait section). MD aware and present with pt returned to bed with SpO2 94% and HR 70 on 4L end of session. Will continue to follow based on medical stability.     Follow Up Recommendations  Home health PT;Supervision - Intermittent     Equipment Recommendations  None recommended by PT    Recommendations for Other Services       Precautions / Restrictions Precautions Precautions: Fall Precaution Comments: watch sats    Mobility  Bed Mobility Overal bed mobility: Independent                Transfers Overall transfer level: Modified independent   Transfers: Sit to/from Stand Sit to Stand: Min assist;Modified independent (Device/Increase time)         General transfer comment: mod I from bed and from toilet, min assist for safety to stand from chair due to decreased oxygen saturations for safety  Ambulation/Gait Ambulation/Gait assistance: Min guard Gait Distance (Feet): 10 Feet Assistive device: 1 person hand held assist Gait Pattern/deviations: Step-through pattern;Decreased stride length   Gait velocity interpretation: <1.8 ft/sec, indicate of risk for recurrent falls General Gait Details: HHA for safety and balance walking from bed to bathroom. pt with noted wheezing with transition to toilet with SPO2 94% on RA throughout, Pt then walked 6' to toilet, tripoding on sink for breathing and stated need for sit. 3' to chair pt with unsteady gait needing  assist to stabilize with rapid SpO2 drop to 78% on RA. MD notified and 4L via [place] applied with sat returning to 95% with HR 70. PT then transferred 2' chair to bed with min HHA and session terminated   Stairs             Wheelchair Mobility    Modified Rankin (Stroke Patients Only)       Balance Overall balance assessment: Needs assistance Sitting-balance support: No upper extremity supported;Feet supported Sitting balance-Leahy Scale: Good     Standing balance support: Single extremity supported Standing balance-Leahy Scale: Fair Standing balance comment: single upper Extremity support with pt reaching out for assist                            Cognition Arousal/Alertness: Awake/alert Behavior During Therapy: [facility] for tasks assessed/performed Overall Cognitive Status: Within Functional Limits for tasks assessed                                        Exercises      General Comments        Pertinent Vitals/Pain Pain Assessment: No/denies pain    Home Living                      Prior Function            PT Goals (current goals can now be found in the care plan section) Progress  towards PT goals: Not progressing toward goals - comment(limited by respiratory function)    Frequency    Min 3X/week      PT Plan Current plan remains appropriate    Co-evaluation              AM-PAC PT "6 Clicks" Daily Activity  Outcome Measure  Difficulty turning over in bed (including adjusting bedclothes, sheets and blankets)?: None Difficulty moving from lying on back to sitting on the side of the bed? : None Difficulty sitting down on and standing up from a chair with arms (e.g., wheelchair, bedside commode, etc,.)?: A Little Help needed moving to and from a bed to chair (including a wheelchair)?: A Little Help needed walking in hospital room?: A Little Help needed climbing 3-5 steps with a railing? : A Little 6 Click  Score: 20    End of Session Equipment Utilized During Treatment: Gait belt Activity Tolerance: Treatment limited secondary to medical complications (Comment) Patient left: in bed;with call bell/phone within reach;with nursing/sitter in room Nurse Communication: Mobility status;Other (comment)(cardiopulmonary function) PT Visit Diagnosis: Other abnormalities of gait and mobility (R26.89)     Time: 0923-3007 PT Time Calculation (min) (ACUTE ONLY): 19 min  Charges:  $Gait Training: 8-22 mins                     Elwyn Reach, [place]    [place] 07/16/2018, 9:09 AM

## 2018-07-16 NOTE — Procedures (Signed)
Patient was seen on dialysis and the procedure was supervised.  BFR 200  Via AVG BP is  171/76.   Patient appears to be tolerating treatment well  Sarah Harmon A 07/16/2018

## 2018-07-16 NOTE — Progress Notes (Signed)
Patient was helped to the bathroom by PT staff.  Patient states she "felt weak" Patient was helped to the chair by PT.  MD at bedside.   Lasix given per order.  Patient helped back to bed.  Patient states she feels better.  Nurse will continue to monitor. Marcille Blanco, RN

## 2018-07-16 NOTE — Consult Note (Signed)
   [facility] CM Inpatient Consult   07/16/2018  BRIANNE MAINA Mar 16, 1946 [id number]   Patient screened for high risk and  [place] Management services. Patient is in the [place] of the [place] Management services under patient's Medicare plan.   Staff was working with patient to transfer to a higher level of care.  Will follow at a more appropriate time.    Please place a [place] [facility] Care Management consult or for questions contact:   Natividad Brood, RN BSN [facility] Liaison  [phone] business mobile phone Toll free office [phone]

## 2018-07-16 NOTE — Progress Notes (Signed)
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  Patient states that she has not been feeling well in the past several days, including generalized weakness, fatigue, decreased appetite and oral intake. Denies unilateral weakness, numbness or tingling in herextremities. She does not have chest pain, cough, fever or chills. She states that she has mild shortness on exertion. Denies nausea, vomiting, diarrhea, abdominal pain. No symptoms of UTI. Patient also reports blood pressure is uncontrolled recently even though she is taking her prescribed blood pressure medications at home.  ED Course:pt was found to havesodium of 120, WBCs 3.6, INR 0.98, PTT 46, ammonia 26, potassium 4.4, bicarbonate 16, creatinine 3.94, BUN 32, temperature normal, bradycardia, tachypnea, oxygen saturation 97% on room air, temperature 97.7, blood pressure elevation to 221/72. Chest x-ray showed mild pulmonary edema and small bilateral pleural effusion. Patient is placed on telemetry bed of observation    Assessment & Plan:   Principal Problem:   Hyponatremia Active Problems:   Depression   Essential hypertension   Hypertensive urgency   Alcoholic cirrhosis of liver without ascites (HCC)   Hepatocellular carcinoma (HCC)   ESRD (end stage renal disease) (HCC)   Anemia due to end stage renal disease (HCC)   Malnutrition of moderate degree  1] acute on chronic hyponatremia-patient admitted with generalized weakness.  Hyponatremia is thought to be secondary  to decreased p.o. intake.  Patient lives alone family reports that she does not eat at home.  Sodium stays at 122.  She was given normal saline 50 cc an hour for 10 hours last night.  Follow-up BMP tomorrow.  Stat chest x-ray shows pulmonary edema and bilateral pleural effusion. Transferred to stepdown unit for further care. 2] acute hypoxia respiratory failure secondary to pulmonary edema and bilateral pleural effusion patient is CKD stage V-patient still makes urine I have given her 60 mg of IV Lasix x1 dose.  Nephrology consult pending.  3] status post hypertensive urgency continue amlodipine clonidine.  Stable blood pressure.  4] history of alcoholic cirrhosis and hepatocellular carcinoma followed as as an outpatient with oncology.  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   DVT prophylaxis: SCD code Status: Full code Family Communication no family in the room today  Disposition Plan TBD Consultants: Nephrology consult pending   Procedures: None Antimicrobials: None  Subjective: Patient walked to the bathroom this morning with PT and was sitting in the toilet.  While walking back to the bed patient developed extreme shortness of breath and wheezing saturation was very low in the 80s was placed on 4 L of oxygen to put her back in bed give her Lasix stat neb treatments patient feeling better.   Objective: Vitals:   07/16/18 0540 07/16/18 0857 07/16/18 0903 07/16/18 1041  BP: (!) 148/59  (!) 176/59 (!) 174/56  Pulse: (!) 57 60 (!) 59   Resp: 18  (!) 25 (!) 21  Temp: 98.5 F (36.9 C)   98.1 F (36.7 C)  TempSrc: Oral   Oral  SpO2: 95% 95% 100% 97%  Weight: 54.3 kg (119 lb 9.6 oz)     Height:  Intake/Output Summary (Last 24 hours) at 07/16/2018 1130 Last data filed at 07/16/2018 0900 Gross per 24 hour  Intake 1255.44 ml  Output 202 ml  Net 1053.44 ml   Filed Weights   07/14/18 0406 07/15/18 0508 07/16/18 0540  Weight: 53.8  kg (118 lb 11.2 oz) 53.6 kg (118 lb 3.2 oz) 54.3 kg (119 lb 9.6 oz)    Examination:  General exam: Appears calm and comfortable  Respiratory system: Clear to auscultation. Respiratory effort normal. Cardiovascular system: S1 & S2 heard, RRR. No JVD, murmurs, rubs, gallops or clicks. No pedal edema. Gastrointestinal system: Abdomen is nondistended, soft and nontender. No organomegaly or masses felt. Normal bowel sounds heard. Central nervous system: Alert and oriented. No focal neurological deficits. Extremities: Symmetric 5 x 5 power. Skin: No rashes, lesions or ulcers Psychiatry: Judgement and insight appear normal. Mood & affect appropriate.     Data Reviewed: I have personally reviewed following labs and imaging studies  CBC: Recent Labs  Lab 07/13/18 2013 07/14/18 0702  WBC 3.6* 3.7*  HGB 8.4* 7.8*  HCT 26.2* 23.5*  MCV 88.5 86.4  PLT 155 010*   Basic Metabolic Panel: Recent Labs  Lab 07/13/18 2013 07/14/18 0702 07/15/18 0009 07/16/18 0549  NA 120* 122* 122* 122*  K 4.4 4.5 4.6 4.2  CL 95* 96* 98 96*  CO2 16* 17* 15* 17*  GLUCOSE 132* 105* 99 88  BUN 32* 36* 40* 43*  CREATININE 3.94* 4.05* 4.18* 4.37*  CALCIUM 8.7* 8.6* 8.2* 8.3*   GFR: Estimated Creatinine Clearance: 9.5 mL/min (A) (by C-G formula based on SCr of 4.37 mg/dL (H)). Liver Function Tests: Recent Labs  Lab 07/14/18 0124  AST 28  ALT 10  ALKPHOS 46  BILITOT 0.9  PROT 7.2  ALBUMIN 2.9*   No results for input(s): LIPASE, AMYLASE in the last 168 hours. Recent Labs  Lab 07/14/18 0124  AMMONIA 26   Coagulation Profile: Recent Labs  Lab 07/14/18 0124  INR 0.98   Cardiac Enzymes: No results for input(s): CKTOTAL, CKMB, CKMBINDEX, TROPONINI in the last 168 hours. BNP (last 3 results) No results for input(s): PROBNP in the last 8760 hours. HbA1C: No results for input(s): HGBA1C in the last 72 hours. CBG: Recent Labs  Lab 07/15/18 0746 07/15/18 1103 07/15/18 1628 07/15/18 2111  07/16/18 0804  GLUCAP 91 93 102* 107* 93   Lipid Profile: No results for input(s): CHOL, HDL, LDLCALC, TRIG, CHOLHDL, LDLDIRECT in the last 72 hours. Thyroid Function Tests: Recent Labs    07/14/18 0702  TSH 1.123   Anemia Panel: Recent Labs    07/15/18 0009  VITAMINB12 1,854*  FOLATE >100.0  FERRITIN 424*  TIBC 209*  IRON 26*  RETICCTPCT 1.6   Sepsis Labs: No results for input(s): PROCALCITON, LATICACIDVEN in the last 168 hours.  No results found for this or any previous visit (from the past 240 hour(s)).       Radiology Studies: Dg Chest Port 1 View  Result Date: 07/16/2018 CLINICAL DATA:  72 year old female with hypoxia. Subsequent encounter. EXAM: PORTABLE CHEST 1 VIEW COMPARISON:  07/13/2018. FINDINGS: Slight decrease in degree of pulmonary edema. Bilateral pleural effusions. Basilar atelectasis or infiltrate difficult to exclude in this setting of bilateral pleural effusions. Cardiomegaly. Calcified aorta. IMPRESSION: Slight decrease in degree of pulmonary edema. Bilateral pleural effusions. Bibasilar atelectasis or infiltrate difficult to exclude in this setting of pleural effusions. Appearance unchanged. Cardiomegaly. Aortic Atherosclerosis (ICD10-I70.0). Electronically Signed   By: Genia Del M.D.   On:  07/16/2018 09:29        Scheduled Meds: . ALPRAZolam  0.5 mg Oral QHS  . amLODipine  10 mg Oral Daily  . cholecalciferol  1,000 Units Oral Daily  . cloNIDine  0.1 mg Oral TID  . feeding supplement (NEPRO CARB STEADY)  237 mL Oral TID BM  . folic acid  1 mg Oral Daily  . hydrALAZINE  100 mg Oral TID  . hydroxypropyl methylcellulose / hypromellose  1 drop Both Eyes Daily  . ketotifen  1 drop Both Eyes BID  . lactulose  20 g Oral Daily  . propranolol  40 mg Oral BID   Continuous Infusions: . sodium chloride Stopped (07/14/18 0700)     LOS: 1 day     Georgette Shell, MD [facility]  If 7PM-7AM, please contact  night-coverage [email or web address] Password [email or web address] 07/16/2018, 11:30 AM

## 2018-07-17 LAB — BASIC METABOLIC PANEL
ANION GAP: 6 (ref 5–15)
BUN: 28 mg/dL — AB (ref 8–23)
CHLORIDE: 98 mmol/L (ref 98–111)
CO2: 24 mmol/L (ref 22–32)
Calcium: 7.9 mg/dL — ABNORMAL LOW (ref 8.9–10.3)
Creatinine, Ser: 3.58 mg/dL — ABNORMAL HIGH (ref 0.44–1.00)
GFR calc Af Amer: 14 mL/min — ABNORMAL LOW (ref >60)
GFR calc non Af Amer: 12 mL/min — ABNORMAL LOW (ref >60)
Glucose, Bld: 94 mg/dL (ref 70–99)
POTASSIUM: 3.5 mmol/L (ref 3.5–5.1)
Sodium: 128 mmol/L — ABNORMAL LOW (ref 135–145)

## 2018-07-17 LAB — HEPATITIS B SURFACE ANTIBODY,QUALITATIVE: Hep B S Ab: NONREACTIVE

## 2018-07-17 LAB — HEPATITIS B SURFACE ANTIGEN: Hepatitis B Surface Ag: NEGATIVE

## 2018-07-17 LAB — HEPATITIS B CORE ANTIBODY, IGM: Hep B C IgM: NEGATIVE

## 2018-07-17 MED ORDER — CLONIDINE HCL 0.1 MG PO TABS
0.1000 mg | ORAL_TABLET | Freq: Two times a day (BID) | ORAL | Status: DC
  Administered 2018-07-17 – 2018-07-19 (×4): 0.1 mg via ORAL
  Filled 2018-07-17 (×4): qty 1

## 2018-07-17 MED ORDER — CHLORHEXIDINE GLUCONATE CLOTH 2 % EX PADS: 6.0000 | MEDICATED_PAD | Freq: Every day | CUTANEOUS | Status: DC

## 2018-07-17 MED ORDER — DARBEPOETIN ALFA 100 MCG/0.5ML IJ SOSY
100.0000 ug | PREFILLED_SYRINGE | INTRAMUSCULAR | Status: DC
  Administered 2018-07-18: 100 ug via INTRAVENOUS
  Filled 2018-07-17: qty 0.5

## 2018-07-17 NOTE — Progress Notes (Signed)
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  Patient states that she has not been feeling well in the past several days, including generalized weakness, fatigue, decreased appetite and oral intake. Denies unilateral weakness, numbness or tingling in herextremities. She does not have chest pain, cough, fever or chills. She states that she has mild shortness on exertion. Denies nausea, vomiting, diarrhea, abdominal pain. No symptoms of UTI. Patient also reports blood pressure is uncontrolled recently even though she is taking her prescribed blood pressure medications at home.  ED Course:pt was found to havesodium of 120, WBCs 3.6, INR 0.98, PTT 46, ammonia 26, potassium 4.4, bicarbonate 16, creatinine 3.94, BUN 32, temperature normal, bradycardia, tachypnea, oxygen saturation 97% on room air, temperature 97.7, blood pressure elevation to 221/72. Chest x-ray showed mild pulmonary edema and small bilateral pleural effusion. Patient is placed on telemetry bed of observation     Assessment & Plan:   Principal Problem:   Hyponatremia Active Problems:   Depression   Essential hypertension   Hypertensive urgency   Alcoholic cirrhosis of liver without ascites (HCC)   Hepatocellular carcinoma (HCC)   ESRD (end stage renal disease) (HCC)   Anemia due to end stage renal disease (HCC)   Malnutrition of moderate degree  1] Acute on chronic hyponatremia-due to fluid overload with significant improvement with HD.She had HD for the  first time yesterday.plan for second session today.patient admitted with generalized weakness. Sodium today is 128 which is up from 122 since admit.her baseline is 125 to 130.  2] acute hypoxia respiratory failure secondary to pulmonary edema and bilateral pleural effusion patient is CKD stage V- s/p first HD session yesterday.she feels she is back to her baseline.AVF left arm.  3] status post hypertensive urgency continue amlodipine clonidine.  Stable blood pressure.  4] history of alcoholic cirrhosis and hepatocellular carcinoma followed as as an outpatient with oncology.  5]generalized weakness-vision seen by physical therapy recommends home health PT.  Patient lives with her son but her son is out of most of the time.      DVT prophylaxis: scd Code Status: Full code Family Communication: Discussed with daughter Disposition Plan Patient was started on hemodialysis for the first time during this admission.  So once nephrology feels she is ready to go home she can be discharged home with home health. Consultants:  Nephrology  Procedures: None Antimicrobials:  None Subjective: She is resting in bed smiling feeling better  appreciating everything we have done for her, she denies any nausea vomiting or diarrhea.  Breathing is back to baseline.  Objective: Vitals:   07/17/18 0022 07/17/18 0435 07/17/18 0435 07/17/18 0837  BP:  (!) 139/53  (!) 141/54  Pulse: (!) 53  (!) 56 (!) 59  Resp:    18  Temp: 98.8 F (37.1 C)  98.7 F (37.1 C) 98.6 F (37 C)  TempSrc: Oral  Oral Oral  SpO2: 100%  100% 100%  Weight:  53.9 kg (118 lb 12.8 oz)    Height:        Intake/Output Summary (Last 24 hours) at 07/17/2018 0850 Last data filed at 07/17/2018 0500 Gross per 24 hour  Intake 340 ml  Output 989 ml  Net -649 ml   Filed Weights   07/16/18 1424 07/16/18 1647 07/17/18 0435  Weight: 54.5 kg (120 lb 2.4 oz) 53.5 kg (117 lb 15.1 oz) 53.9 kg (118 lb 12.8 oz)    Examination:  General  exam: Appears calm and comfortable  Respiratory system: Clear to auscultation. Respiratory effort normal. Cardiovascular system: S1 & S2 heard, RRR. No JVD, murmurs, rubs, gallops or clicks. No pedal edema. Gastrointestinal system: Abdomen is nondistended, soft and nontender. No organomegaly or masses felt. Normal bowel sounds heard. Central nervous system: Alert and oriented. No focal neurological deficits. Extremities: Symmetric 5 x 5 power. Skin: No rashes, lesions or ulcers Psychiatry: Judgement and insight appear normal. Mood & affect appropriate.     Data Reviewed: I have personally reviewed following labs and imaging studies  CBC: Recent Labs  Lab 07/13/18 2013 07/14/18 0702 07/16/18 1518  WBC 3.6* 3.7* 4.1  HGB 8.4* 7.8* 7.2*  HCT 26.2* 23.5* 21.4*  MCV 88.5 86.4 84.6  PLT 155 133* 785*   Basic Metabolic Panel: Recent Labs  Lab 07/14/18 0702 07/15/18 0009 07/16/18 0549 07/16/18 1519 07/17/18 0456  NA 122* 122* 122* 121* 128*  K 4.5 4.6 4.2 4.0 3.5  CL 96* 98 96* 95* 98  CO2 17* 15* 17* 16* 24  GLUCOSE 105* 99 88 116* 94  BUN 36* 40* 43* 43* 28*  CREATININE 4.05* 4.18* 4.37* 4.42* 3.58*  CALCIUM 8.6* 8.2* 8.3* 8.2* 7.9*  PHOS  --   --   --  4.6  --    GFR: Estimated Creatinine Clearance: 11.6 mL/min (A) (by C-G formula based on SCr of 3.58 mg/dL (H)). Liver Function Tests: Recent Labs  Lab 07/14/18 0124 07/16/18 1519  AST 28  --   ALT 10  --   ALKPHOS 46  --   BILITOT 0.9  --   PROT 7.2  --   ALBUMIN 2.9* 2.6*   No results for input(s): LIPASE, AMYLASE in the last 168 hours. Recent Labs  Lab 07/14/18 0124  AMMONIA 26   Coagulation Profile: Recent Labs  Lab 07/14/18 0124  INR 0.98   Cardiac Enzymes: No results for input(s): CKTOTAL, CKMB, CKMBINDEX, TROPONINI in the last 168 hours. BNP (last 3 results) No results for input(s): PROBNP in the last 8760 hours. HbA1C: No results for input(s): HGBA1C in the last 72 hours. CBG: Recent Labs    Lab 07/15/18 0746 07/15/18 1103 07/15/18 1628 07/15/18 2111 07/16/18 0804  GLUCAP 91 93 102* 107* 93   Lipid Profile: No results for input(s): CHOL, HDL, LDLCALC, TRIG, CHOLHDL, LDLDIRECT in the last 72 hours. Thyroid Function Tests: No results for input(s): TSH, T4TOTAL, FREET4, T3FREE, THYROIDAB in the last 72 hours. Anemia Panel: Recent Labs    07/15/18 0009  VITAMINB12 1,854*  FOLATE >100.0  FERRITIN 424*  TIBC 209*  IRON 26*  RETICCTPCT 1.6   Sepsis Labs: No results for input(s): PROCALCITON, LATICACIDVEN in the last 168 hours.  Recent Results (from the past 240 hour(s))  MRSA PCR Screening     Status: None   Collection Time: 07/16/18 10:41 AM  Result Value Ref Range Status   MRSA by PCR NEGATIVE NEGATIVE Final    Comment:        The GeneXpert MRSA Assay (FDA approved for NASAL specimens only), is one component of a comprehensive MRSA colonization surveillance program. It is not intended to diagnose MRSA infection nor to guide or monitor treatment for  MRSA infections. Performed at [facility], [place] [address], [place], [place] [address]          Radiology Studies: Dg Chest [place] 1 View  Result Date: 07/16/2018 CLINICAL DATA:  72 year old female with hypoxia. Subsequent encounter. EXAM: PORTABLE CHEST 1 VIEW COMPARISON:  07/13/2018. FINDINGS: Slight decrease in degree of pulmonary edema. Bilateral pleural effusions. Basilar atelectasis or infiltrate difficult to exclude in this setting of bilateral pleural effusions. Cardiomegaly. Calcified aorta. IMPRESSION: Slight decrease in degree of pulmonary edema. Bilateral pleural effusions. Bibasilar atelectasis or infiltrate difficult to exclude in this setting of pleural effusions. Appearance unchanged. Cardiomegaly. Aortic Atherosclerosis (ICD10-I70.0). Electronically Signed   By: Genia Del M.D.   On: 07/16/2018 09:29        Scheduled Meds: . ALPRAZolam  0.5 mg Oral QHS  . amLODipine  10 mg Oral  Daily  . Chlorhexidine Gluconate Cloth  6 each Topical Q0600  . cholecalciferol  1,000 Units Oral Daily  . cloNIDine  0.1 mg Oral TID  . [START ON 07/23/2018] darbepoetin (ARANESP) injection - DIALYSIS  100 mcg Intravenous Q Tue-HD  . feeding supplement (NEPRO CARB STEADY)  237 mL Oral TID BM  . folic acid  1 mg Oral Daily  . hydrALAZINE  100 mg Oral TID  . hydroxypropyl methylcellulose / hypromellose  1 drop Both Eyes Daily  . ketotifen  1 drop Both Eyes BID  . lactulose  20 g Oral Daily  . propranolol  40 mg Oral BID   Continuous Infusions: . sodium chloride Stopped (07/14/18 0700)  . ferric gluconate (FERRLECIT/NULECIT) IV Stopped (07/16/18 1932)     LOS: 2 days     Georgette Shell, MD [facility] If 7PM-7AM, please contact night-coverage [email or web address] Password [email or web address] 07/17/2018, 8:50 AM

## 2018-07-17 NOTE — Consult Note (Addendum)
   [facility] CM Inpatient Consult   07/17/2018  Sarah Harmon 1945/12/23 [id number]     [facility] Care Management follow up.  Went to bedside to speak with Sarah Harmon about [facility] Management program. She is agreeable and [facility] written consent obtained. [facility] folder provided.  Sarah Harmon endorses that she lives alone. Confirms Primary Care Provider is Dr. Jenny Reichmann [facility] listed as doing transition of care call post discharge). Confirmed best contact number as [phone]. Denies having concerns with transportation or medication.  Will make referral to [place] due multiple hospitalizations and unplanned readmission risk score of 36% (extreme). She has a medical history of advanced CKD and now ESRD and new to HD, HTN, malnutrition, anemia, cirrhosis, HLD.  Made inpatient RNCM aware [facility] will follow post discharge.   Marthenia Rolling, MSN-Ed, RN,BSN [facility] Liaison [phone]

## 2018-07-17 NOTE — Plan of Care (Signed)
  Problem: Activity: Goal: Risk for activity intolerance will decrease Outcome: Progressing   Problem: Education: Goal: Knowledge of General Education information will improve Description Including pain rating scale, medication(s)/side effects and non-pharmacologic comfort measures Outcome: Completed/Met

## 2018-07-17 NOTE — Progress Notes (Addendum)
Occupational Therapy Treatment Patient Details Name: Sarah Harmon MRN: [id number] DOB: [birth year] Today's Date: 07/17/2018    History of present illness Pt is a 72 y.o. female admitted 07/13/18 with generalized weakness; worked up for hyponatremia and fluid overload. PMH includes HTN, alcoholic cirrhosis, hepatocellular carcinoma, ESRD, depression.   OT comments  Patient progressing well.  With encouragement engaged in OT session.  Demonstrated ability to complete transfers and grooming standing at sink with supervision. Improving activity tolerance with ability to complete 2 grooming tasks standing without rest breaks.  Short blessed completed with impairments noted in short term memory and sequencing, recommend 24/7 assistance at discharge due to cognition (short blessed 21/28 with significant impairment). Plan for next session for energy conservation training.  Will continue to follow.    Follow Up Recommendations  No OT follow up;Supervision/Assistance - 24 hour    Equipment Recommendations  None recommended by OT    Recommendations for Other Services PT consult    Precautions / Restrictions Precautions Precautions: Fall Precaution Comments: watch sats Restrictions Weight Bearing Restrictions: No       Mobility Bed Mobility Overal bed mobility: Independent                Transfers Overall transfer level: Needs assistance Equipment used: None Transfers: Sit to/from Stand Sit to Stand: Supervision         General transfer comment: close supervision for safety     Balance Overall balance assessment: Needs assistance Sitting-balance support: No upper extremity supported;Feet supported Sitting balance-Leahy Scale: Good     Standing balance support: No upper extremity supported;During functional activity Standing balance-Leahy Scale: Fair Standing balance comment: ADLs within base of support with supervision                           ADL  either performed or assessed with clinical judgement   ADL Overall ADL's : Needs assistance/impaired     Grooming: Supervision/safety;Standing;Wash/dry hands;Wash/dry face               Lower Body Dressing: Supervision/safety;Sit to/from stand Lower Body Dressing Details (indicate cue type and reason): able to adjust socks with supervision Toilet Transfer: Supervision/safety;Ambulation;Cueing for safety(simulated to/from EOB) Toilet Transfer Details (indicate cue type and reason): verbal cueing for safety and pacing          Functional mobility during ADLs: Supervision/safety General ADL Comments: Close supervision for ADLs standing at sink and fixing sheets on bed.      Vision       Perception     Praxis      Cognition Arousal/Alertness: Awake/alert Behavior During Therapy: WFL for tasks assessed/performed Overall Cognitive Status: Impaired/Different from baseline Area of Impairment: Memory;Problem solving;Following commands                     Memory: Decreased short-term memory Following Commands: Follows multi-step commands inconsistently     Problem Solving: Requires verbal cues General Comments: Short blessed test completed with significant impairments in short term memory and sequencing.        Exercises     Shoulder Instructions       General Comments      Pertinent Vitals/ Pain       Pain Assessment: No/denies pain  Home Living  Prior Functioning/Environment              Frequency  Min 2X/week        Progress Toward Goals  OT Goals(current goals can now be found in the care plan section)  Progress towards OT goals: Progressing toward goals  Acute Rehab OT Goals Patient Stated Goal: "Go home" OT Goal Formulation: With patient Time For Goal Achievement: 07/29/18 Potential to Achieve Goals: Good  Plan Frequency remains appropriate;Discharge plan needs to be  updated    Co-evaluation                 AM-PAC PT "6 Clicks" Daily Activity     Outcome Measure   Help from another person eating meals?: None Help from another person taking care of personal grooming?: None Help from another person toileting, which includes using toliet, bedpan, or urinal?: A Little Help from another person bathing (including washing, rinsing, drying)?: None Help from another person to put on and taking off regular upper body clothing?: None Help from another person to put on and taking off regular lower body clothing?: None 6 Click Score: 23    End of Session    OT Visit Diagnosis: Unsteadiness on feet (R26.81);Other abnormalities of gait and mobility (R26.89);Muscle weakness (generalized) (M62.81)   Activity Tolerance Patient tolerated treatment well   Patient Left in bed;with call bell/phone within reach   Nurse Communication Mobility status        Time: 8828-0034 OT Time Calculation (min): 19 min  Charges: OT General Charges $OT Visit: 1 Visit OT Treatments $Self Care/Home Management : 8-22 mins  Delight Stare, OTR/L  Pager [place] 07/17/2018, 4:51 PM

## 2018-07-17 NOTE — Progress Notes (Signed)
Subjective:  Had first HD yest- removed almost one liter- feels better- AVG did well  Objective Vital signs in last 24 hours: Vitals:   07/17/18 0435 07/17/18 0435 07/17/18 0837 07/17/18 0955  BP: (!) 139/53  (!) 141/54   Pulse:  (!) 56 (!) 59 60  Resp:   18   Temp:  98.7 F (37.1 C) 98.6 F (37 C)   TempSrc:  Oral Oral   SpO2:  100% 100%   Weight: 53.9 kg (118 lb 12.8 oz)     Height:       Weight change: 0.25 kg (8.8 oz)  Intake/Output Summary (Last 24 hours) at 07/17/2018 0958 Last data filed at 07/17/2018 0500 Gross per 24 hour  Intake 220 ml  Output 989 ml  Net -769 ml    Assessment/Plan: 72 year old black female with cirrhosis and advancing CKD.  Admitted with weakness, very high blood pressure-has had hyponatremia not corrected with fluids and worsening CKD 1.Renal-advanced CKD as outpatient- now ESRD,status post AV graft with revision 6 weeks ago.  Patient's course of late has featured hospitalizations hyponatremia and weight loss.  Even though there are not acute indications for dialysis I felt there were chronic ones.  I felt the initiation of dialysis may stabilize her medical condition. First 2-hour dialysis treatment via AV graft 7/30-  Will plan on second 8/1 to minimize new trauma to AVG- CLIP to OP center- seems to live close to [place]- hopefully will know something tomorrow  2. Hypertension/volume  -patient with history of malignant hypertension.  At this time she appears volume overloaded which is not helping blood pressure control.  Right now on Norvasc 10, clonidine 0.1 3 times daily, hydralazine 100 3 times daily and Inderal- will add volume removal into regimen and wean meds if able - start by dec clonidine to bid  3.  Malnutrition-patient has been losing weight and has very little to spare-last weight as outpatient was 112 pounds 4. Anemia  -hemoglobin been very low.  Iron stores low as well-last hemoglobin in outpatient was over 11-will reinitiate iron and ESA 5.   Hyponatremia-now I think is secondary to volume overload.  Dialysis should correct 6. Bones- last PTH low- no vit D- phos 4.6 no binder      Randee Upchurch A    Labs: Basic Metabolic Panel: Recent Labs  Lab 07/16/18 0549 07/16/18 1519 07/17/18 0456  NA 122* 121* 128*  K 4.2 4.0 3.5  CL 96* 95* 98  CO2 17* 16* 24  GLUCOSE 88 116* 94  BUN 43* 43* 28*  CREATININE 4.37* 4.42* 3.58*  CALCIUM 8.3* 8.2* 7.9*  PHOS  --  4.6  --    Liver Function Tests: Recent Labs  Lab 07/14/18 0124 07/16/18 1519  AST 28  --   ALT 10  --   ALKPHOS 46  --   BILITOT 0.9  --   PROT 7.2  --   ALBUMIN 2.9* 2.6*   No results for input(s): LIPASE, AMYLASE in the last 168 hours. Recent Labs  Lab 07/14/18 0124  AMMONIA 26   CBC: Recent Labs  Lab 07/13/18 2013 07/14/18 0702 07/16/18 1518  WBC 3.6* 3.7* 4.1  HGB 8.4* 7.8* 7.2*  HCT 26.2* 23.5* 21.4*  MCV 88.5 86.4 84.6  PLT 155 133* 127*   Cardiac Enzymes: No results for input(s): CKTOTAL, CKMB, CKMBINDEX, TROPONINI in the last 168 hours. CBG: Recent Labs  Lab 07/15/18 0746 07/15/18 1103 07/15/18 1628 07/15/18 2111 07/16/18 0804  GLUCAP 91  93 102* 107* 93    Iron Studies:  Recent Labs    07/15/18 0009  IRON 26*  TIBC 209*  FERRITIN 424*   Studies/Results: Dg Chest Port 1 View  Result Date: 07/16/2018 CLINICAL DATA:  72 year old female with hypoxia. Subsequent encounter. EXAM: PORTABLE CHEST 1 VIEW COMPARISON:  07/13/2018. FINDINGS: Slight decrease in degree of pulmonary edema. Bilateral pleural effusions. Basilar atelectasis or infiltrate difficult to exclude in this setting of bilateral pleural effusions. Cardiomegaly. Calcified aorta. IMPRESSION: Slight decrease in degree of pulmonary edema. Bilateral pleural effusions. Bibasilar atelectasis or infiltrate difficult to exclude in this setting of pleural effusions. Appearance unchanged. Cardiomegaly. Aortic Atherosclerosis (ICD10-I70.0). Electronically Signed   By:  Genia Del M.D.   On: 07/16/2018 09:29   Medications: Infusions: . sodium chloride Stopped (07/14/18 0700)  . ferric gluconate (FERRLECIT/NULECIT) IV Stopped (07/16/18 1932)    Scheduled Medications: . ALPRAZolam  0.5 mg Oral QHS  . amLODipine  10 mg Oral Daily  . Chlorhexidine Gluconate Cloth  6 each Topical Q0600  . cholecalciferol  1,000 Units Oral Daily  . cloNIDine  0.1 mg Oral TID  . [START ON 07/23/2018] darbepoetin (ARANESP) injection - DIALYSIS  100 mcg Intravenous Q Tue-HD  . feeding supplement (NEPRO CARB STEADY)  237 mL Oral TID BM  . folic acid  1 mg Oral Daily  . hydrALAZINE  100 mg Oral TID  . hydroxypropyl methylcellulose / hypromellose  1 drop Both Eyes Daily  . ketotifen  1 drop Both Eyes BID  . lactulose  20 g Oral Daily  . propranolol  40 mg Oral BID    have reviewed scheduled and prn medications.  Physical Exam: General: NAD- looks better  Heart: RRR Lungs: mostly clear Abdomen: soft, non tender Extremities: pitting edema Dialysis Access: left upper AVG- no bruising     07/17/2018,9:58 AM  LOS: 2 days

## 2018-07-18 DIAGNOSIS — I129 Hypertensive chronic kidney disease with stage 1 through stage 4 chronic kidney disease, or unspecified chronic kidney disease: Secondary | ICD-10-CM | POA: Diagnosis not present

## 2018-07-18 DIAGNOSIS — Z992 Dependence on renal dialysis: Secondary | ICD-10-CM | POA: Diagnosis not present

## 2018-07-18 DIAGNOSIS — N186 End stage renal disease: Secondary | ICD-10-CM | POA: Diagnosis not present

## 2018-07-18 LAB — BASIC METABOLIC PANEL
Anion gap: 7 (ref 5–15)
BUN: 30 mg/dL — ABNORMAL HIGH (ref 8–23)
CALCIUM: 8.1 mg/dL — AB (ref 8.9–10.3)
CO2: 22 mmol/L (ref 22–32)
Chloride: 98 mmol/L (ref 98–111)
Creatinine, Ser: 3.92 mg/dL — ABNORMAL HIGH (ref 0.44–1.00)
GFR calc Af Amer: 12 mL/min — ABNORMAL LOW (ref >60)
GFR, EST NON AFRICAN AMERICAN: 10 mL/min — AB (ref >60)
GLUCOSE: 94 mg/dL (ref 70–99)
Potassium: 3.6 mmol/L (ref 3.5–5.1)
Sodium: 127 mmol/L — ABNORMAL LOW (ref 135–145)

## 2018-07-18 LAB — GLUCOSE, CAPILLARY: Glucose-Capillary: 113 mg/dL — ABNORMAL HIGH (ref 70–99)

## 2018-07-18 LAB — CBC
HCT: 23.3 % — ABNORMAL LOW (ref 36.0–46.0)
Hemoglobin: 7.7 g/dL — ABNORMAL LOW (ref 12.0–15.0)
MCH: 28.4 pg (ref 26.0–34.0)
MCHC: 33 g/dL (ref 30.0–36.0)
MCV: 86 fL (ref 78.0–100.0)
Platelets: 100 10*3/uL — ABNORMAL LOW (ref 150–400)
RBC: 2.71 MIL/uL — ABNORMAL LOW (ref 3.87–5.11)
RDW: 14.5 % (ref 11.5–15.5)
WBC: 4 10*3/uL (ref 4.0–10.5)

## 2018-07-18 MED ORDER — DARBEPOETIN ALFA 100 MCG/0.5ML IJ SOSY
PREFILLED_SYRINGE | INTRAMUSCULAR | Status: AC
  Administered 2018-07-18: 100 ug via INTRAVENOUS
  Filled 2018-07-18: qty 0.5

## 2018-07-18 NOTE — Progress Notes (Signed)
Physical Therapy Treatment Patient Details Name: Sarah Harmon MRN: [id number] DOB: [birth year] Today's Date: 07/18/2018    History of Present Illness Pt is a 72 y.o. female admitted 07/13/18 with generalized weakness; worked up for hyponatremia and fluid overload. Pt began HD on 07/15/2018. PMH includes HTN, alcoholic cirrhosis, hepatocellular carcinoma, ESRD, depression.    PT Comments    Pt making steady progress. Remains weak and dyspneic. Spoke to pt about my concerns of her returning home alone and pt reports she has a son from [facility] who is going to come and stay with her after dc. She reports he can provide 24 hour assist. With that assist feel pt can return home with HHPT and rollator.    Follow Up Recommendations  Home health PT;Supervision/Assistance - 24 hour     Equipment Recommendations  Other (comment)(rollator)    Recommendations for Other Services       Precautions / Restrictions Precautions Precautions: Fall Precaution Comments: watch sats Restrictions Weight Bearing Restrictions: No    Mobility  Bed Mobility Overal bed mobility: Independent                Transfers Overall transfer level: Needs assistance Equipment used: 4-wheeled walker Transfers: Sit to/from Stand Sit to Stand: Supervision         General transfer comment: supervision for safety  Ambulation/Gait Ambulation/Gait assistance: Supervision Gait Distance (Feet): 125 Feet Assistive device: 4-wheeled walker Gait Pattern/deviations: Step-through pattern;Decreased stride length Gait velocity: decr Gait velocity interpretation: 1.31 - 2.62 ft/sec, indicative of limited community ambulator General Gait Details: Amb on 4L of O2. SpO2 95% after amb. Gait stabilized with rollator.   Stairs             Wheelchair Mobility    Modified Rankin (Stroke Patients Only)       Balance Overall balance assessment: Needs assistance Sitting-balance support: No upper extremity  supported;Feet supported Sitting balance-Leahy Scale: Good     Standing balance support: No upper extremity supported;During functional activity Standing balance-Leahy Scale: Fair                              Cognition Arousal/Alertness: Awake/alert Behavior During Therapy: WFL for tasks assessed/performed Overall Cognitive Status: Impaired/Different from baseline Area of Impairment: Memory;Problem solving;Following commands                     Memory: Decreased short-term memory Following Commands: Follows multi-step commands inconsistently     Problem Solving: Requires verbal cues        Exercises      General Comments        Pertinent Vitals/Pain Pain Assessment: No/denies pain    Home Living                      Prior Function            PT Goals (current goals can now be found in the care plan section) Progress towards PT goals: Progressing toward goals    Frequency    Min 3X/week      PT Plan Current plan remains appropriate    Co-evaluation              AM-PAC PT "6 Clicks" Daily Activity  Outcome Measure  Difficulty turning over in bed (including adjusting bedclothes, sheets and blankets)?: None Difficulty moving from lying on back to sitting on the side of the bed? : None Difficulty  sitting down on and standing up from a chair with arms (e.g., wheelchair, bedside commode, etc,.)?: A Little Help needed moving to and from a bed to chair (including a wheelchair)?: A Little Help needed walking in hospital room?: A Little Help needed climbing 3-5 steps with a railing? : A Little 6 Click Score: 20    End of Session Equipment Utilized During Treatment: Gait belt Activity Tolerance: Patient tolerated treatment well Patient left: in bed;with call bell/phone within reach;Other (comment)(transporter from HD) Nurse Communication: Mobility status PT Visit Diagnosis: Other abnormalities of gait and mobility  (R26.89);Muscle weakness (generalized) (M62.81)     Time: 0981-1914 PT Time Calculation (min) (ACUTE ONLY): 19 min  Charges:  $Gait Training: 8-22 mins                     [facility] [place] 07/18/2018, 10:47 AM

## 2018-07-18 NOTE — Clinical Social Work Note (Signed)
Clinical Social Work Assessment  Patient Details  Name: Sarah Harmon MRN: [id number] Date of Birth: Apr 07, 1946  Date of referral:  07/18/18               Reason for consult:  Transportation                Permission sought to share information with:    Permission granted to share information::     Name::        Agency::     Relationship::     Contact Information:     Housing/Transportation Living arrangements for the past 2 months:  Single Family Home Source of Information:  Patient Patient Interpreter Needed:  None Criminal Activity/Legal Involvement Pertinent to Current Situation/Hospitalization:  No - Comment as needed Significant Relationships:  Adult Children Lives with:  Self, Adult Children Do you feel safe going back to the place where you live?  Yes Need for family participation in patient care:  Yes (Comment)  Care giving concerns: Patient from home independently. New to HD. CSW consulted for transportation to outpatient HD.   Social Worker assessment / plan: CSW met with patient at bedside. Patient alert and oriented. CSW introduced self and role and assessed transportation needs, as patient will be new to outpatient dialysis. Patient reported that her son Milbert Coulter is coming from out of town to stay with her. Patient planning to go home with home health.   She also stated that her daughter will drive her to dialysis. CSW provided SCAT application and informed patient if she applies for SCAT, it can be used for transportation to HD as a backup if there are times family unable to take her. CSW signing off, as no additional needs identified at this time. Please re-consult if needed.  Employment status:  Retired Forensic scientist:  Medicare PT Recommendations:  Home with [place] / Referral to community resources:     Patient/Family's Response to care: Patient appreciative of care.  Patient/Family's Understanding of and Emotional Response to  Diagnosis, Current Treatment, and Prognosis: Patient with understanding of condition and need for outpatient dialysis. Patient planning to return home with support from her son and with home health services.  Emotional Assessment Appearance:  Appears stated age Attitude/Demeanor/Rapport:  Engaged Affect (typically observed):  Accepting, Calm, Appropriate, Pleasant Orientation:  Oriented to Place, Oriented to  Time, Oriented to Situation, Oriented to Self Alcohol / Substance use:  Not Applicable Psych involvement (Current and /or in the community):  No (Comment)  Discharge Needs  Concerns to be addressed:  Other (Comment Required(transportation to HD) Readmission within the last 30 days:  No Current discharge risk:  Physical Impairment Barriers to Discharge:  Continued Medical Work up   Estanislado Emms, LCSW 07/18/2018, 4:12 PM

## 2018-07-18 NOTE — Procedures (Signed)
Patient seen on Hemodialysis. QB 250, UF goal 2L Resting comfortably.   Elmarie Shiley MD [facility]. Office # [phone] Pager # [phone] 4:30 PM

## 2018-07-18 NOTE — Plan of Care (Signed)
  Problem: Activity: Goal: Risk for activity intolerance will decrease Outcome: Progressing   Problem: Safety: Goal: Ability to remain free from injury will improve Outcome: Progressing   

## 2018-07-18 NOTE — Progress Notes (Signed)
Subjective:  Due for second HD today - no new c//o's- still just weak, not sure strong enough to go home just yet Objective Vital signs in last 24 hours: Vitals:   07/18/18 0443 07/18/18 0445 07/18/18 0755 07/18/18 0847  BP: (!) 136/50  (!) 153/59 (!) 180/65  Pulse: (!) 55  (!) 57 61  Resp:      Temp: 98.1 F (36.7 C)  98.4 F (36.9 C)   TempSrc: Oral  Oral   SpO2: 98%  93%   Weight:  56.2 kg (123 lb 12.8 oz)    Height:       Weight change: 1.655 kg (3 lb 10.4 oz)  Intake/Output Summary (Last 24 hours) at 07/18/2018 6269 Last data filed at 07/18/2018 0700 Gross per 24 hour  Intake 820 ml  Output -  Net 820 ml    Assessment/Plan: 72 year old black female with cirrhosis and advancing CKD.  Admitted with weakness, very high blood pressure-has had hyponatremia not corrected with fluids and worsening CKD 1.Renal-advanced CKD as outpatient- now ESRD,status post AV graft in May with revision 6 weeks ago.  Patient's course of late has featured hospitalizations hyponatremia and weight loss.  Even though there are not acute indications for dialysis I felt there were chronic ones.  I felt the initiation of dialysis may stabilize her medical condition. First 2-hour dialysis treatment via AV graft 7/30-  Will plan on second today to minimize new trauma to AVG- CLIP to OP center- seems to live close to [place]- hopefully will know something soon 2. Hypertension/volume  -patient with history of malignant hypertension.  At this time she appears volume overloaded which is not helping blood pressure control.  Right now on Norvasc 10, clonidine 0.1 BID, hydralazine 100 3 times daily and Inderal- will add volume removal into regimen and wean meds if able - 3.  Malnutrition-patient has been losing weight and has very little to spare-last weight as outpatient was 112 pounds- albumin 2.6- on supps 4. Anemia  -hemoglobin been very low.  Iron stores low as well-last hemoglobin in outpatient was over 11-have  reinitiated  iron and ESA 5.  Hyponatremia-now I think is secondary to volume overload.  Dialysis should correct 6. Bones- last PTH low- no vit D- phos 4.6 no binder      Savanah Bayles A    Labs: Basic Metabolic Panel: Recent Labs  Lab 07/16/18 1519 07/17/18 0456 07/18/18 0407  NA 121* 128* 127*  K 4.0 3.5 3.6  CL 95* 98 98  CO2 16* 24 22  GLUCOSE 116* 94 94  BUN 43* 28* 30*  CREATININE 4.42* 3.58* 3.92*  CALCIUM 8.2* 7.9* 8.1*  PHOS 4.6  --   --    Liver Function Tests: Recent Labs  Lab 07/14/18 0124 07/16/18 1519  AST 28  --   ALT 10  --   ALKPHOS 46  --   BILITOT 0.9  --   PROT 7.2  --   ALBUMIN 2.9* 2.6*   No results for input(s): LIPASE, AMYLASE in the last 168 hours. Recent Labs  Lab 07/14/18 0124  AMMONIA 26   CBC: Recent Labs  Lab 07/13/18 2013 07/14/18 0702 07/16/18 1518  WBC 3.6* 3.7* 4.1  HGB 8.4* 7.8* 7.2*  HCT 26.2* 23.5* 21.4*  MCV 88.5 86.4 84.6  PLT 155 133* 127*   Cardiac Enzymes: No results for input(s): CKTOTAL, CKMB, CKMBINDEX, TROPONINI in the last 168 hours. CBG: Recent Labs  Lab 07/15/18 0746 07/15/18 1103 07/15/18 1628 07/15/18  2111 07/16/18 0804  GLUCAP 91 93 102* 107* 93    Iron Studies:  No results for input(s): IRON, TIBC, TRANSFERRIN, FERRITIN in the last 72 hours. Studies/Results: Dg Chest Port 1 View  Result Date: 07/16/2018 CLINICAL DATA:  72 year old female with hypoxia. Subsequent encounter. EXAM: PORTABLE CHEST 1 VIEW COMPARISON:  07/13/2018. FINDINGS: Slight decrease in degree of pulmonary edema. Bilateral pleural effusions. Basilar atelectasis or infiltrate difficult to exclude in this setting of bilateral pleural effusions. Cardiomegaly. Calcified aorta. IMPRESSION: Slight decrease in degree of pulmonary edema. Bilateral pleural effusions. Bibasilar atelectasis or infiltrate difficult to exclude in this setting of pleural effusions. Appearance unchanged. Cardiomegaly. Aortic Atherosclerosis  (ICD10-I70.0). Electronically Signed   By: Genia Del M.D.   On: 07/16/2018 09:29   Medications: Infusions: . sodium chloride Stopped (07/14/18 0700)  . ferric gluconate (FERRLECIT/NULECIT) IV 125 mg (07/17/18 2156)    Scheduled Medications: . ALPRAZolam  0.5 mg Oral QHS  . amLODipine  10 mg Oral Daily  . Chlorhexidine Gluconate Cloth  6 each Topical Q0600  . cholecalciferol  1,000 Units Oral Daily  . cloNIDine  0.1 mg Oral Q12H  . darbepoetin (ARANESP) injection - DIALYSIS  100 mcg Intravenous Q Thu-HD  . feeding supplement (NEPRO CARB STEADY)  237 mL Oral TID BM  . folic acid  1 mg Oral Daily  . hydrALAZINE  100 mg Oral TID  . hydroxypropyl methylcellulose / hypromellose  1 drop Both Eyes Daily  . ketotifen  1 drop Both Eyes BID  . lactulose  20 g Oral Daily  . propranolol  40 mg Oral BID    have reviewed scheduled and prn medications.  Physical Exam: General: NAD- looks better  Heart: RRR Lungs: mostly clear Abdomen: soft, non tender Extremities: pitting edema Dialysis Access: left upper AVG patent - no bruising     07/18/2018,9:06 AM  LOS: 3 days

## 2018-07-18 NOTE — Progress Notes (Signed)
PROGRESS NOTE    Sarah Harmon  PYP:[id number] DOB: [birth year] DOA: 07/13/2018 PCP: Biagio Borg, MD   Brief Narrative: 72 year old with past medical history relevant for hypertension, hyperlipidemia, depression, anxiety, hepatocellular cancer status post surgery currently considered cured, stage V CKD/ESRD with left upper extremity AV fistula, chronic hyponatremia, cirrhosis due to history of alcohol abuse admitted with feeling poorly and found to have significant hyponatremia.   Assessment & Plan:   Principal Problem:   Hyponatremia Active Problems:   Depression   Essential hypertension   Hypertensive urgency   Alcoholic cirrhosis of liver without ascites (HCC)   Hepatocellular carcinoma (HCC)   ESRD (end stage renal disease) (HCC)   Anemia due to end stage renal disease (HCC)   Malnutrition of moderate degree   #) Hypervolemic hyponatremia secondary to ESRD/CKD stage V: Patient apparently has been started on dialysis with improvement in her hyponatremia. -Fluid restriction - Dialysis - Nephrology following, appreciate recommendations  #) Hypoproliferative anemia: - Started on iron and ESA  #) Alcoholic cirrhosis: No evidence of ascites -Continue lactulose twice daily  #) Hypertension: -Continue hydralazine 100 mg 3 times daily -Continue amlodipine 10 mg daily -Continue propranolol 40 mg twice daily -Continue clonidine 0.1 mg every 12 hours -Diuresis with dialysis  #) Psych: -Continue alprazolam 0.5 mg nightly  Fluids: Restrict Elect lites: Monitor and supplement Nutrition: Renal diet  Prophylaxis: Subcu heparin  Disposition: Pending resolution of hyponatremia and likely discharge to skilled nursing facility  Full code   Consultants:   Nephrology  Procedures:   None  Antimicrobials:   None   Subjective: Patient reports she is feeling fairly well.  She denies any chest pain, nausea, vomiting, diarrhea.  She reports that her strength is  returning.  Objective: Vitals:   07/18/18 0445 07/18/18 0755 07/18/18 0847 07/18/18 1223  BP:  (!) 153/59 (!) 180/65 (!) 152/49  Pulse:  (!) 57 61   Resp:      Temp:  98.4 F (36.9 C)  (!) 97.5 F (36.4 C)  TempSrc:  Oral  Oral  SpO2:  93%  97%  Weight: 56.2 kg (123 lb 12.8 oz)     Height:        Intake/Output Summary (Last 24 hours) at 07/18/2018 1420 Last data filed at 07/18/2018 1054 Gross per 24 hour  Intake 700 ml  Output 300 ml  Net 400 ml   Filed Weights   07/16/18 1647 07/17/18 0435 07/18/18 0445  Weight: 53.5 kg (117 lb 15.1 oz) 53.9 kg (118 lb 12.8 oz) 56.2 kg (123 lb 12.8 oz)    Examination:  General exam: Appears calm and comfortable  Respiratory system: Clear to auscultation. Respiratory effort normal.  His lung sounds at bases Cardiovascular system: 2 out of 6 referred murmur from left upper extremity fistula, regular rate and rhythm Gastrointestinal system: Abdomen is nondistended, soft and nontender. No organomegaly or masses felt. Normal bowel sounds heard. Central nervous system: Alert and oriented. No focal neurological deficits. Extremities: No lower extremity edema, left upper extremity fistula with palpable thrill and bruit Skin: No rashes on visible skin Psychiatry: Judgement and insight appear normal. Mood & affect appropriate.     Data Reviewed: I have personally reviewed following labs and imaging studies  CBC: Recent Labs  Lab 07/13/18 2013 07/14/18 0702 07/16/18 1518 07/18/18 1013  WBC 3.6* 3.7* 4.1 4.0  HGB 8.4* 7.8* 7.2* 7.7*  HCT 26.2* 23.5* 21.4* 23.3*  MCV 88.5 86.4 84.6 86.0  PLT 155 133* 127*  254*   Basic Metabolic Panel: Recent Labs  Lab 07/15/18 0009 07/16/18 0549 07/16/18 1519 07/17/18 0456 07/18/18 0407  NA 122* 122* 121* 128* 127*  K 4.6 4.2 4.0 3.5 3.6  CL 98 96* 95* 98 98  CO2 15* 17* 16* 24 22  GLUCOSE 99 88 116* 94 94  BUN 40* 43* 43* 28* 30*  CREATININE 4.18* 4.37* 4.42* 3.58* 3.92*  CALCIUM 8.2* 8.3*  8.2* 7.9* 8.1*  PHOS  --   --  4.6  --   --    GFR: Estimated Creatinine Clearance: 10.6 mL/min (A) (by C-G formula based on SCr of 3.92 mg/dL (H)). Liver Function Tests: Recent Labs  Lab 07/14/18 0124 07/16/18 1519  AST 28  --   ALT 10  --   ALKPHOS 46  --   BILITOT 0.9  --   PROT 7.2  --   ALBUMIN 2.9* 2.6*   No results for input(s): LIPASE, AMYLASE in the last 168 hours. Recent Labs  Lab 07/14/18 0124  AMMONIA 26   Coagulation Profile: Recent Labs  Lab 07/14/18 0124  INR 0.98   Cardiac Enzymes: No results for input(s): CKTOTAL, CKMB, CKMBINDEX, TROPONINI in the last 168 hours. BNP (last 3 results) No results for input(s): PROBNP in the last 8760 hours. HbA1C: No results for input(s): HGBA1C in the last 72 hours. CBG: Recent Labs  Lab 07/15/18 0746 07/15/18 1103 07/15/18 1628 07/15/18 2111 07/16/18 0804  GLUCAP 91 93 102* 107* 93   Lipid Profile: No results for input(s): CHOL, HDL, LDLCALC, TRIG, CHOLHDL, LDLDIRECT in the last 72 hours. Thyroid Function Tests: No results for input(s): TSH, T4TOTAL, FREET4, T3FREE, THYROIDAB in the last 72 hours. Anemia Panel: No results for input(s): VITAMINB12, FOLATE, FERRITIN, TIBC, IRON, RETICCTPCT in the last 72 hours. Sepsis Labs: No results for input(s): PROCALCITON, LATICACIDVEN in the last 168 hours.  Recent Results (from the past 240 hour(s))  MRSA PCR Screening     Status: None   Collection Time: 07/16/18 10:41 AM  Result Value Ref Range Status   MRSA by PCR NEGATIVE NEGATIVE Final    Comment:        The GeneXpert MRSA Assay (FDA approved for NASAL specimens only), is one component of a comprehensive MRSA colonization surveillance program. It is not intended to diagnose MRSA infection nor to guide or monitor treatment for MRSA infections. Performed at [facility], [place] [address], [place], [place] [address]          Radiology Studies: No results found.      Scheduled Meds: .  ALPRAZolam  0.5 mg Oral QHS  . amLODipine  10 mg Oral Daily  . Chlorhexidine Gluconate Cloth  6 each Topical Q0600  . cholecalciferol  1,000 Units Oral Daily  . cloNIDine  0.1 mg Oral Q12H  . darbepoetin (ARANESP) injection - DIALYSIS  100 mcg Intravenous Q Thu-HD  . feeding supplement (NEPRO CARB STEADY)  237 mL Oral TID BM  . folic acid  1 mg Oral Daily  . hydrALAZINE  100 mg Oral TID  . hydroxypropyl methylcellulose / hypromellose  1 drop Both Eyes Daily  . ketotifen  1 drop Both Eyes BID  . lactulose  20 g Oral Daily  . propranolol  40 mg Oral BID   Continuous Infusions: . sodium chloride Stopped (07/14/18 0700)  . ferric gluconate (FERRLECIT/NULECIT) IV 125 mg (07/18/18 1104)     LOS: 3 days    Time spent: 35  Cristy Folks, MD [facility]  If 7PM-7AM, please contact night-coverage [email or web address] Password [email or web address] 07/18/2018, 2:20 PM

## 2018-07-19 ENCOUNTER — Inpatient Hospital Stay (HOSPITAL_COMMUNITY): Payer: Medicare Other

## 2018-07-19 DIAGNOSIS — E871 Hypo-osmolality and hyponatremia: Principal | ICD-10-CM

## 2018-07-19 LAB — CBC
HCT: 20 % — ABNORMAL LOW (ref 36.0–46.0)
Hemoglobin: 6.5 g/dL — CL (ref 12.0–15.0)
MCH: 28.3 pg (ref 26.0–34.0)
MCHC: 32.5 g/dL (ref 30.0–36.0)
MCV: 87 fL (ref 78.0–100.0)
Platelets: 86 K/uL — ABNORMAL LOW (ref 150–400)
RBC: 2.3 MIL/uL — ABNORMAL LOW (ref 3.87–5.11)
RDW: 14.5 % (ref 11.5–15.5)
WBC: 3.1 10*3/uL — ABNORMAL LOW (ref 4.0–10.5)

## 2018-07-19 LAB — RENAL FUNCTION PANEL
Albumin: 2.3 g/dL — ABNORMAL LOW (ref 3.5–5.0)
Anion gap: 6 (ref 5–15)
BUN: 18 mg/dL (ref 8–23)
CO2: 27 mmol/L (ref 22–32)
Calcium: 8 mg/dL — ABNORMAL LOW (ref 8.9–10.3)
Chloride: 98 mmol/L (ref 98–111)
Creatinine, Ser: 2.93 mg/dL — ABNORMAL HIGH (ref 0.44–1.00)
GFR calc Af Amer: 17 mL/min — ABNORMAL LOW (ref >60)
GFR calc non Af Amer: 15 mL/min — ABNORMAL LOW (ref >60)
Glucose, Bld: 98 mg/dL (ref 70–99)
Phosphorus: 3 mg/dL (ref 2.5–4.6)
Potassium: 3.5 mmol/L (ref 3.5–5.1)
Sodium: 131 mmol/L — ABNORMAL LOW (ref 135–145)

## 2018-07-19 LAB — PREPARE RBC (CROSSMATCH)

## 2018-07-19 LAB — MAGNESIUM: Magnesium: 1.8 mg/dL (ref 1.7–2.4)

## 2018-07-19 MED ORDER — CHLORHEXIDINE GLUCONATE CLOTH 2 % EX PADS
6.0000 | MEDICATED_PAD | Freq: Every day | CUTANEOUS | Status: DC
  Administered 2018-07-19: 6 via TOPICAL

## 2018-07-19 MED ORDER — CLONIDINE HCL 0.1 MG PO TABS
0.1000 mg | ORAL_TABLET | Freq: Three times a day (TID) | ORAL | Status: DC
  Administered 2018-07-19 – 2018-07-20 (×2): 0.1 mg via ORAL
  Filled 2018-07-19 (×2): qty 1

## 2018-07-19 MED ORDER — ALBUTEROL SULFATE (2.5 MG/3ML) 0.083% IN NEBU
2.5000 mg | INHALATION_SOLUTION | RESPIRATORY_TRACT | Status: DC | PRN
  Administered 2018-07-19: 2.5 mg via RESPIRATORY_TRACT
  Filled 2018-07-19: qty 3

## 2018-07-19 MED ORDER — IPRATROPIUM-ALBUTEROL 0.5-2.5 (3) MG/3ML IN SOLN
3.0000 mL | Freq: Four times a day (QID) | RESPIRATORY_TRACT | Status: DC
  Administered 2018-07-19 – 2018-07-20 (×4): 3 mL via RESPIRATORY_TRACT
  Filled 2018-07-19 (×4): qty 3

## 2018-07-19 MED ORDER — CLONIDINE HCL 0.1 MG PO TABS
0.1000 mg | ORAL_TABLET | Freq: Once | ORAL | Status: AC
  Administered 2018-07-19: 0.1 mg via ORAL
  Filled 2018-07-19: qty 1

## 2018-07-19 MED ORDER — METHYLPREDNISOLONE SODIUM SUCC 125 MG IJ SOLR
80.0000 mg | Freq: Three times a day (TID) | INTRAMUSCULAR | Status: DC
  Administered 2018-07-19 – 2018-07-21 (×6): 80 mg via INTRAVENOUS
  Filled 2018-07-19 (×6): qty 2

## 2018-07-19 MED ORDER — SODIUM CHLORIDE 0.9% IV SOLUTION: Freq: Once | INTRAVENOUS | Status: DC

## 2018-07-19 MED ORDER — FUROSEMIDE 10 MG/ML IJ SOLN
160.0000 mg | Freq: Once | INTRAVENOUS | Status: AC
  Administered 2018-07-19: 160 mg via INTRAVENOUS
  Filled 2018-07-19: qty 16

## 2018-07-19 NOTE — Progress Notes (Signed)
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  Subjective: The patient is alert and conversant.  She complains of some shortness of breath but overall tells me that she is feeling okay.  She denies chest pain nausea vomiting or abdominal pain.  She is able to complete full sentences but must pulse between sentences to catch her breath.  Assessment & Plan:  Hypervolemic hyponatremia secondary to now ESRD + cirrhosis - has been started on dialysis as of 7/30 - care per Nephrology   Acute hypoxic resp failure  Multifactorial w/ bronchospasm, pulmonary edema, anxiety - nebs - diuretic - O2 support - as pt is wheezing signif will add short course of steroid   Hypoproliferative/Fe deficient anemia Started on iron and ESA - transfusing 1U PRBC today   Alcoholic cirrhosis Continue lactulose - no marked ascites on exam   Hypertension BP variable - likely elevated at present due to resp distress and anxiety - agree w/ use of clonidine - adjust and follow   DVT prophylaxis: SCDs Code Status: FULL CODE Family Communication: no family present at time of exam  Disposition Plan: SDU  Consultants:  Nephrology   Antimicrobials:  none   Objective: Blood pressure (!) 191/69, pulse (!) 58, temperature (!) 97.5 F (36.4 C), temperature source Oral, resp. rate (!) 28, height 5\' 3"  (1.6 m), weight 54.1 kg (119 lb 4.3 oz), SpO2 93 %.  Intake/Output Summary (Last 24 hours) at 07/19/2018 1716 Last data filed at 07/19/2018 1714 Gross per 24 hour  Intake  633 ml  Output 2008 ml  Net -1375 ml   Filed Weights   07/18/18 0445 07/18/18 1444 07/19/18 0400  Weight: 56.2 kg (123 lb 12.8 oz) 54 kg (119 lb 0.8 oz) 54.1 kg (119 lb 4.3 oz)    Examination: General: mild resp distress Lungs: diffuse expiratory wheeze with no prolongation of expiratory phase and no focal crackles Cardiovascular: Regular rate and rhythm without murmur gallop or rub normal S1 and S2 Abdomen: Nontender, nondistended, soft, bowel sounds positive, no rebound, no ascites, no appreciable mass Extremities: 1+ bilateral lower extremity edema   CBC: Recent Labs  Lab 07/16/18 1518 07/18/18 1013 07/19/18 0415  WBC 4.1 4.0 3.1*  HGB 7.2* 7.7* 6.5*  HCT 21.4* 23.3* 20.0*  MCV 84.6 86.0 87.0  PLT 127* 100* 86*   Basic Metabolic Panel: Recent Labs  Lab 07/16/18 1519 07/17/18 0456 07/18/18 0407 07/19/18 0415  NA 121* 128* 127* 131*  K 4.0 3.5 3.6 3.5  CL 95* 98 98 98  CO2 16* 24 22 27   GLUCOSE 116* 94 94 98  BUN 43* 28* 30* 18  CREATININE 4.42* 3.58* 3.92* 2.93*  CALCIUM 8.2* 7.9* 8.1* 8.0*  MG  --   --   --  1.8  PHOS 4.6  --   --  3.0   GFR: Estimated Creatinine Clearance: 14.1 mL/min (A) (by C-G formula based on SCr of 2.93 mg/dL (H)).  Liver Function Tests: Recent Labs  Lab 07/14/18 0124 07/16/18 1519 07/19/18 0415  AST 28  --   --   ALT 10  --   --  ALKPHOS 46  --   --   BILITOT 0.9  --   --   PROT 7.2  --   --   ALBUMIN 2.9* 2.6* 2.3*    Recent Labs  Lab 07/14/18 0124  AMMONIA 26    Coagulation Profile: Recent Labs  Lab 07/14/18 0124  INR 0.98   HbA1C: Hemoglobin A1C  Date/Time Value Ref Range Status  05/17/2018 10:27 AM 4.7 4.0 - 5.6 % Final   Hgb A1c MFr Bld  Date/Time Value Ref Range Status  02/28/2017 09:44 AM 5.7 4.6 - 6.5 % Final    Comment:    Glycemic Control Guidelines for People with Diabetes:Non Diabetic:  <6%Goal of Therapy: <7%Additional Action Suggested:  >8%   02/26/2015 10:43 AM 5.8 4.6 - 6.5 % Final     Comment:    Glycemic Control Guidelines for People with Diabetes:Non Diabetic:  <6%Goal of Therapy: <7%Additional Action Suggested:  >8%     CBG: Recent Labs  Lab 07/15/18 1103 07/15/18 1628 07/15/18 2111 07/16/18 0804 07/18/18 1426  GLUCAP 93 102* 107* 93 113*    Recent Results (from the past 240 hour(s))  MRSA PCR Screening     Status: None   Collection Time: 07/16/18 10:41 AM  Result Value Ref Range Status   MRSA by PCR NEGATIVE NEGATIVE Final    Comment:        The GeneXpert MRSA Assay (FDA approved for NASAL specimens only), is one component of a comprehensive MRSA colonization surveillance program. It is not intended to diagnose MRSA infection nor to guide or monitor treatment for MRSA infections. Performed at [facility], [place] [address] [place], [place] [address]      Scheduled Meds: . sodium chloride   Intravenous Once  . ALPRAZolam  0.5 mg Oral QHS  . amLODipine  10 mg Oral Daily  . Chlorhexidine Gluconate Cloth  6 each Topical Q0600  . Chlorhexidine Gluconate Cloth  6 each Topical Q0600  . cholecalciferol  1,000 Units Oral Daily  . cloNIDine  0.1 mg Oral Q12H  . darbepoetin (ARANESP) injection - DIALYSIS  100 mcg Intravenous Q Thu-HD  . feeding supplement (NEPRO CARB STEADY)  237 mL Oral TID BM  . folic acid  1 mg Oral Daily  . hydrALAZINE  100 mg Oral TID  . hydroxypropyl methylcellulose / hypromellose  1 drop Both Eyes Daily  . ketotifen  1 drop Both Eyes BID  . lactulose  20 g Oral Daily  . methylPREDNISolone (SOLU-MEDROL) injection  80 mg Intravenous Q8H  . propranolol  40 mg Oral BID   Continuous Infusions: . sodium chloride Stopped (07/14/18 0700)  . ferric gluconate (FERRLECIT/NULECIT) IV Stopped (07/19/18 1201)  . furosemide       LOS: 4 days   Cherene Altes, MD [facility] Office  [phone] Pager - Text Page per Amion  If 7PM-7AM, please contact night-coverage per Amion 07/19/2018, 5:16 PM

## 2018-07-19 NOTE — Progress Notes (Signed)
OT Cancellation Note  Patient Details Name: Sarah Harmon MRN: [id number] DOB: 17-Jan-1946   Cancelled Treatment:    Reason Eval/Treat Not Completed: Medical issues which prohibited therapy(BP EOB 196/71, 209/77), pt asymptomatic but BP hypertensive and unable to treat following protocol.  RN aware.  Will return as able.    Delight Stare, OTR/L  Pager [place] 07/19/2018, 9:44 AM

## 2018-07-19 NOTE — Progress Notes (Signed)
Subjective:  second HD went well- no new c/o's- still just weak  Objective Vital signs in last 24 hours: Vitals:   07/19/18 0012 07/19/18 0400 07/19/18 0808 07/19/18 0949  BP: (!) 149/46 (!) 140/51 (!) 157/61 (!) 209/77  Pulse: 64 64  71  Resp: 20 (!) 21    Temp: 99.8 F (37.7 C) 98.7 F (37.1 C) 98.3 F (36.8 C)   TempSrc: Oral Oral Oral   SpO2: 100% 100% 97%   Weight:  54.1 kg (119 lb 4.3 oz)    Height:       Weight change: -2.155 kg (-4 lb 12 oz)  Intake/Output Summary (Last 24 hours) at 07/19/2018 1018 Last data filed at 07/18/2018 2015 Gross per 24 hour  Intake 298 ml  Output 2008 ml  Net -1710 ml    Assessment/Plan: 72 year old black female with cirrhosis and advancing CKD.  Admitted with weakness, very high blood pressure-has had hyponatremia not corrected with fluids and worsening CKD 1.Renal-advanced CKD as outpatient- now ESRD,status post AV graft in May with revision 6 weeks ago.  Patient's course of late has featured hospitalizations hyponatremia and weight loss.   I felt the initiation of dialysis may stabilize her medical condition. First 2-hour dialysis treatment via AV graft 7/30, second 8/1-  Will plan on third tomorrow to minimize new trauma to AVG- CLIP to OP center- seems to live close to [place]- hopefully will know something soon 2. Hypertension/volume  -patient with history of malignant hypertension.  At this time she appears volume overloaded which is not helping blood pressure control.  Right now on Norvasc 10, clonidine 0.1 BID, hydralazine 100 3 times daily and Inderal- will add volume removal into regimen and wean meds if able - 3.  Malnutrition-patient has been losing weight and has very little to spare-last weight as outpatient was 112 pounds- albumin 2.3- on supps 4. Anemia  -hemoglobin been very low.  Iron stores low as well-last hemoglobin in outpatient was over 11-have reinitiated  iron and ESA- hgb 6.5 this am again ? GIB - check stools- giving one unit  today - may need more tomorrow with HD 5.  Hyponatremia-now I think is secondary to volume overload.  Dialysis should correct 6. Bones- last PTH low- no vit D- phos 3.0 no binder      Anisten Tomassi A    Labs: Basic Metabolic Panel: Recent Labs  Lab 07/16/18 1519 07/17/18 0456 07/18/18 0407 07/19/18 0415  NA 121* 128* 127* 131*  K 4.0 3.5 3.6 3.5  CL 95* 98 98 98  CO2 16* 24 22 27   GLUCOSE 116* 94 94 98  BUN 43* 28* 30* 18  CREATININE 4.42* 3.58* 3.92* 2.93*  CALCIUM 8.2* 7.9* 8.1* 8.0*  PHOS 4.6  --   --  3.0   Liver Function Tests: Recent Labs  Lab 07/14/18 0124 07/16/18 1519 07/19/18 0415  AST 28  --   --   ALT 10  --   --   ALKPHOS 46  --   --   BILITOT 0.9  --   --   PROT 7.2  --   --   ALBUMIN 2.9* 2.6* 2.3*   No results for input(s): LIPASE, AMYLASE in the last 168 hours. Recent Labs  Lab 07/14/18 0124  AMMONIA 26   CBC: Recent Labs  Lab 07/13/18 2013 07/14/18 0702 07/16/18 1518 07/18/18 1013 07/19/18 0415  WBC 3.6* 3.7* 4.1 4.0 3.1*  HGB 8.4* 7.8* 7.2* 7.7* 6.5*  HCT 26.2* 23.5* 21.4*  23.3* 20.0*  MCV 88.5 86.4 84.6 86.0 87.0  PLT 155 133* 127* 100* 86*   Cardiac Enzymes: No results for input(s): CKTOTAL, CKMB, CKMBINDEX, TROPONINI in the last 168 hours. CBG: Recent Labs  Lab 07/15/18 1103 07/15/18 1628 07/15/18 2111 07/16/18 0804 07/18/18 1426  GLUCAP 93 102* 107* 93 113*    Iron Studies:  No results for input(s): IRON, TIBC, TRANSFERRIN, FERRITIN in the last 72 hours. Studies/Results: No results found. Medications: Infusions: . sodium chloride Stopped (07/14/18 0700)  . ferric gluconate (FERRLECIT/NULECIT) IV Stopped (07/18/18 1228)    Scheduled Medications: . sodium chloride   Intravenous Once  . ALPRAZolam  0.5 mg Oral QHS  . amLODipine  10 mg Oral Daily  . Chlorhexidine Gluconate Cloth  6 each Topical Q0600  . cholecalciferol  1,000 Units Oral Daily  . cloNIDine  0.1 mg Oral Q12H  . darbepoetin (ARANESP)  injection - DIALYSIS  100 mcg Intravenous Q Thu-HD  . feeding supplement (NEPRO CARB STEADY)  237 mL Oral TID BM  . folic acid  1 mg Oral Daily  . hydrALAZINE  100 mg Oral TID  . hydroxypropyl methylcellulose / hypromellose  1 drop Both Eyes Daily  . ketotifen  1 drop Both Eyes BID  . lactulose  20 g Oral Daily  . propranolol  40 mg Oral BID    have reviewed scheduled and prn medications.  Physical Exam: General: NAD- looks better  Heart: RRR Lungs: mostly clear Abdomen: soft, non tender Extremities: pitting edema Dialysis Access: left upper AVG patent - no bruising     07/19/2018,10:18 AM  LOS: 4 days

## 2018-07-19 NOTE — Care Management Important Message (Signed)
Important Message  Patient Details  Name: Sarah Harmon MRN: [id number] Date of Birth: 02/13/1946   Medicare Important Message Given:  Yes Patient refused to sign. Unsigned copy left with patient.   Thurlow Gallaga P Taffy Delconte 07/19/2018, 3:29 PM

## 2018-07-19 NOTE — Progress Notes (Signed)
Pt noted to have increased work of breathing, tachypnea, bilateral wheezes on assessment.  PRN nebulizer given with no improvement. MD notified. STAT CXR obtained.  Sats maintained 92-95%. Will continue to monitor

## 2018-07-19 NOTE — Progress Notes (Signed)
Prior to blood administration patient c/o being SOB. Patient noted to be wheezing. RT gave breathing tx. Notified Dr. Moshe Cipro. Orders okay to start blood. During blood transfusion patient had episode of labored breathing, wheezing, and o2 sats decreasing to mid 80s on 4L. Patient received another breathing tx. O2 went back to mid 90s. Dr. Moshe Cipro and Dr. Thereasa Solo notified and both saw the patient and provided new orders. Will continue to monitor.

## 2018-07-19 NOTE — Progress Notes (Signed)
Called about pt with inc WOB- she is having exp wheezes and BP is high.  Getting blood but the sxms started before the blood.  I have d/w nursing, will give another breathing tx as that seemed to help pt- also give 160 mg of lasix once and give clonidine to get BP down and maybe calm her down as well    Sarah Harmon A

## 2018-07-19 NOTE — Progress Notes (Signed)
PT Cancellation Note  Patient Details Name: Sarah Harmon MRN: [id number] DOB: [birth year]   Cancelled Treatment:    Reason Eval/Treat Not Completed: Medical issues which prohibited therapy; patient receiving blood transfusion and c/o too SOB to walk this pm.  Encouraged to walk with nursing later or tomorrow if feeling able. Will continue attempts.   Reginia Naas 07/19/2018, 3:12 PM  Magda Kiel, [place] 07/19/2018

## 2018-07-19 NOTE — Plan of Care (Signed)
  Problem: Activity: Goal: Risk for activity intolerance will decrease Outcome: Progressing   Problem: Clinical Measurements: Goal: Respiratory complications will improve Outcome: Not Progressing   Problem: Nutrition: Goal: Adequate nutrition will be maintained Outcome: Not Progressing

## 2018-07-20 LAB — TYPE AND SCREEN
ABO/RH(D): O POS
Antibody Screen: NEGATIVE
Unit division: 0

## 2018-07-20 LAB — CBC
HCT: 27.1 % — ABNORMAL LOW (ref 36.0–46.0)
Hemoglobin: 9.2 g/dL — ABNORMAL LOW (ref 12.0–15.0)
MCH: 28.8 pg (ref 26.0–34.0)
MCHC: 33.9 g/dL (ref 30.0–36.0)
MCV: 85 fL (ref 78.0–100.0)
PLATELETS: 70 10*3/uL — AB (ref 150–400)
RBC: 3.19 MIL/uL — AB (ref 3.87–5.11)
RDW: 14.2 % (ref 11.5–15.5)
WBC: 3 10*3/uL — ABNORMAL LOW (ref 4.0–10.5)

## 2018-07-20 LAB — COMPREHENSIVE METABOLIC PANEL
ALK PHOS: 45 U/L (ref 38–126)
ALT: 11 U/L (ref 0–44)
AST: 33 U/L (ref 15–41)
Albumin: 2.4 g/dL — ABNORMAL LOW (ref 3.5–5.0)
Anion gap: 9 (ref 5–15)
BILIRUBIN TOTAL: 0.9 mg/dL (ref 0.3–1.2)
BUN: 24 mg/dL — ABNORMAL HIGH (ref 8–23)
CALCIUM: 8.5 mg/dL — AB (ref 8.9–10.3)
CO2: 24 mmol/L (ref 22–32)
CREATININE: 3.39 mg/dL — AB (ref 0.44–1.00)
Chloride: 92 mmol/L — ABNORMAL LOW (ref 98–111)
GFR calc Af Amer: 14 mL/min — ABNORMAL LOW (ref >60)
GFR calc non Af Amer: 12 mL/min — ABNORMAL LOW (ref >60)
GLUCOSE: 138 mg/dL — AB (ref 70–99)
Potassium: 3.6 mmol/L (ref 3.5–5.1)
SODIUM: 125 mmol/L — AB (ref 135–145)
Total Protein: 6 g/dL — ABNORMAL LOW (ref 6.5–8.1)

## 2018-07-20 LAB — BPAM RBC
Blood Product Expiration Date: 201909042359
ISSUE DATE / TIME: 201908021237
Unit Type and Rh: 5100

## 2018-07-20 MED ORDER — IPRATROPIUM-ALBUTEROL 0.5-2.5 (3) MG/3ML IN SOLN
3.0000 mL | Freq: Three times a day (TID) | RESPIRATORY_TRACT | Status: DC
  Administered 2018-07-21 – 2018-07-23 (×5): 3 mL via RESPIRATORY_TRACT
  Filled 2018-07-20 (×7): qty 3

## 2018-07-20 MED ORDER — CLONIDINE HCL 0.2 MG PO TABS
0.2000 mg | ORAL_TABLET | Freq: Three times a day (TID) | ORAL | Status: DC
  Administered 2018-07-20 – 2018-07-21 (×5): 0.2 mg via ORAL
  Filled 2018-07-20 (×5): qty 1

## 2018-07-20 MED ORDER — ACETAMINOPHEN 500 MG PO TABS: 500.0000 mg | ORAL_TABLET | Freq: Four times a day (QID) | ORAL | Status: DC | PRN

## 2018-07-20 NOTE — Progress Notes (Signed)
Subjective:  Much difficulty with SOB and exp wheezing overnight- needed bipap but off this AM- CXR with pleural effusions L>R and pulm edema- currently on HD with goal uf inc to  4 liters   Objective Vital signs in last 24 hours: Vitals:   07/20/18 0727 07/20/18 0742 07/20/18 0749 07/20/18 0800  BP: (!) 192/89 (!) 172/86    Pulse: 69 70 70 71  Resp: (!) 28 (!) 29  (!) 28  Temp: 98.5 F (36.9 C)     TempSrc: Oral     SpO2: 97%     Weight: 55.5 kg (122 lb 5.7 oz)     Height:       Weight change: 2.246 kg (4 lb 15.2 oz)  Intake/Output Summary (Last 24 hours) at 07/20/2018 0836 Last data filed at 07/19/2018 2230 Gross per 24 hour  Intake 688.25 ml  Output 200 ml  Net 488.25 ml    Assessment/Plan: 72 year old black female with cirrhosis and advancing CKD.  Admitted with weakness, very high blood pressure-has had hyponatremia not corrected with fluids and worsening CKD 1.Renal-advanced CKD as outpatient- now ESRD,status post AV graft in May with revision 6 weeks ago.  Patient's course of late has featured hospitalizations hyponatremia and weight loss.   I felt the initiation of dialysis may stabilize her medical condition. First treatment via AV graft 7/30, second 8/1-  third today to minimize new trauma to AVG- CLIP to OP center- seems to live close to [place]- hopefully will know something soon 2. Hypertension/volume  -patient with history of malignant hypertension.  At this time she appears volume overloaded which is not helping blood pressure control.  Right now on Norvasc 10, clonidine 0.1 BID, hydralazine 100 3 times daily and Inderal- will add volume removal into regimen and wean meds if able - use clonidine PRN for very high BP 3.  Malnutrition-patient has been losing weight and has very little to spare-on supps 4. Anemia  -hemoglobin been very low.  Iron stores low as well-last hemoglobin in outpatient was over 11-have reinitiated  iron and ESA- hgb 6.5 8/2- given one unit now in the 9's.  ? GIB - check stools-  5.  Hyponatremia-now I think is secondary to volume overload.  Dialysis should help correct 6. Bones- last PTH low- no vit D- phos 3.0 no binder  7. SOB-  Seems to have element of reactive airway with wheezing but also effusions and edema- will do what we can with volume removal - if cont to have issue may need follow up CXR and possibly a thoracenteses      Kristjan Derner A    Labs: Basic Metabolic Panel: Recent Labs  Lab 07/16/18 1519  07/18/18 0407 07/19/18 0415 07/20/18 0507  NA 121*   < > 127* 131* 125*  K 4.0   < > 3.6 3.5 3.6  CL 95*   < > 98 98 92*  CO2 16*   < > 22 27 24   GLUCOSE 116*   < > 94 98 138*  BUN 43*   < > 30* 18 24*  CREATININE 4.42*   < > 3.92* 2.93* 3.39*  CALCIUM 8.2*   < > 8.1* 8.0* 8.5*  PHOS 4.6  --   --  3.0  --    < > = values in this interval not displayed.   Liver Function Tests: Recent Labs  Lab 07/14/18 0124 07/16/18 1519 07/19/18 0415 07/20/18 0507  AST 28  --   --  33  ALT 10  --   --  11  ALKPHOS 46  --   --  45  BILITOT 0.9  --   --  0.9  PROT 7.2  --   --  6.0*  ALBUMIN 2.9* 2.6* 2.3* 2.4*   No results for input(s): LIPASE, AMYLASE in the last 168 hours. Recent Labs  Lab 07/14/18 0124  AMMONIA 26   CBC: Recent Labs  Lab 07/14/18 0702 07/16/18 1518 07/18/18 1013 07/19/18 0415 07/20/18 0507  WBC 3.7* 4.1 4.0 3.1* 3.0*  HGB 7.8* 7.2* 7.7* 6.5* 9.2*  HCT 23.5* 21.4* 23.3* 20.0* 27.1*  MCV 86.4 84.6 86.0 87.0 85.0  PLT 133* 127* 100* 86* 70*   Cardiac Enzymes: No results for input(s): CKTOTAL, CKMB, CKMBINDEX, TROPONINI in the last 168 hours. CBG: Recent Labs  Lab 07/15/18 1103 07/15/18 1628 07/15/18 2111 07/16/18 0804 07/18/18 1426  GLUCAP 93 102* 107* 93 113*    Iron Studies:  No results for input(s): IRON, TIBC, TRANSFERRIN, FERRITIN in the last 72 hours. Studies/Results: Dg Chest Port 1 View  Result Date: 07/19/2018 CLINICAL DATA:  Acute respiratory distress. Follow-up CHF  and effusions. EXAM: PORTABLE CHEST 1 VIEW COMPARISON:  07/16/2018, 07/13/2018 and earlier. FINDINGS: Cardiac silhouette enlarged, unchanged. No interval change in the mild diffuse interstitial pulmonary edema. Interval increase in size of the large BILATERAL pleural effusions, LEFT greater than RIGHT. Associated dense consolidation in the lower lobes, lingula and RIGHT MIDDLE LOBE. IMPRESSION: 1. Worsening large BILATERAL pleural effusions, LEFT larger than RIGHT. 2. Stable mild diffuse interstitial pulmonary edema indicating CHF and/or fluid overload. 3. Stable dense passive atelectasis and/or pneumonia involving the BILATERAL LOWER LOBES, the RIGHT MIDDLE LOBE and the lingula. Electronically Signed   By: Evangeline Dakin M.D.   On: 07/19/2018 21:47   Medications: Infusions: . sodium chloride Stopped (07/14/18 0700)  . ferric gluconate (FERRLECIT/NULECIT) IV Stopped (07/19/18 1201)    Scheduled Medications: . sodium chloride   Intravenous Once  . ALPRAZolam  0.5 mg Oral QHS  . amLODipine  10 mg Oral Daily  . Chlorhexidine Gluconate Cloth  6 each Topical Q0600  . cholecalciferol  1,000 Units Oral Daily  . cloNIDine  0.1 mg Oral TID  . darbepoetin (ARANESP) injection - DIALYSIS  100 mcg Intravenous Q Thu-HD  . feeding supplement (NEPRO CARB STEADY)  237 mL Oral TID BM  . folic acid  1 mg Oral Daily  . hydrALAZINE  100 mg Oral TID  . hydroxypropyl methylcellulose / hypromellose  1 drop Both Eyes Daily  . ipratropium-albuterol  3 mL Nebulization Q6H  . ketotifen  1 drop Both Eyes BID  . lactulose  20 g Oral Daily  . methylPREDNISolone (SOLU-MEDROL) injection  80 mg Intravenous Q8H  . propranolol  40 mg Oral BID    have reviewed scheduled and prn medications.  Physical Exam: General: NAD- looks weak Heart: RRR Lungs: exp wheezes- dec BS at bases  Abdomen: soft, non tender Extremities: pitting edema Dialysis Access: left upper AVG patent/accessed  - no bruising     07/20/2018,8:36 AM   LOS: 5 days

## 2018-07-20 NOTE — Procedures (Signed)
Patient was seen on dialysis and the procedure was supervised.  BFR 300  Via AVG BP is  180/78.   Patient appears to be tolerating treatment well- inc UF as able   Jazmin Vensel A 07/20/2018

## 2018-07-20 NOTE — Plan of Care (Signed)
  Problem: Clinical Measurements: Goal: Ability to maintain clinical measurements within normal limits will improve Outcome: Progressing Goal: Respiratory complications will improve Outcome: Progressing   

## 2018-07-20 NOTE — Progress Notes (Signed)
RT gave pt schedule tx.  Pt's bilateral wheezing has gotten worse and noted Increased WOB. Pt stated she got some relieve after tx, but pt's increase WOB has not improved and RR in the upper 20's to 30's.  Pt was placed on Bipap. Rt will continue to monitor.

## 2018-07-20 NOTE — Progress Notes (Signed)
Pt taken off BiPAP and placed on 4L N/C for transport to dialysis.  Pt tolerating well at this time  RN @ bedside. Will continue to monitor.

## 2018-07-20 NOTE — Progress Notes (Signed)
Accepted at [facility] .1st treatment is :Tuesday,August 83,4373 at 11:45am.Schedule is :Tuesday,Thursday,Saturday at 11:42am .2nd shift

## 2018-07-20 NOTE — Progress Notes (Signed)
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  Subjective: The patient had a worsening of her respiratory distress last night and required use of BiPAP.  She was able to be transitioned back to nasal cannula at the time of her dialysis treatment.  She is resting comfortably at the time of visit today with no respiratory distress on nasal cannula only.  Assessment & Plan:  Hypervolemic hyponatremia - CKD now > ESRD secondary to now ESRD + cirrhosis - has been started on dialysis as of 7/30 - care per Nephrology   Acute hypoxic resp failure  Multifactorial w/ bronchospasm, pulmonary edema, anxiety - appears to have stabilized post dialysis - continue to wean oxygen as able   Hypoproliferative/Fe deficient anemia Started on iron and ESA - transfused 1U PRBC thus far this admission   Alcoholic cirrhosis Continue lactulose - no marked ascites on exam   Hypertension BP remains elevated - hopefully this will improve with ongoing dialysis treatments - until then will titrate medication therapy again today  DVT prophylaxis: SCDs Code Status: FULL CODE Family Communication: no family present at time of exam  Disposition Plan: SDU  Consultants:  Nephrology   Antimicrobials:  none   Objective: Blood pressure (!) 161/53, pulse 63, temperature 98.5 F (36.9 C), temperature source Oral, resp. rate 19, height 5\' 3"  (1.6 m), weight 55.5 kg (122 lb 5.7 oz), SpO2 99 %.  Intake/Output Summary (Last 24 hours) at 07/20/2018 1516 Last data filed at  07/20/2018 1319 Gross per 24 hour  Intake 828.25 ml  Output 400 ml  Net 428.25 ml   Filed Weights   07/19/18 0400 07/20/18 0500 07/20/18 0727  Weight: 54.1 kg (119 lb 4.3 oz) 56.2 kg (124 lb) 55.5 kg (122 lb 5.7 oz)    Examination: General: no respiratory distress Lungs: faint diffuse wheezing with no significant crackles Cardiovascular: Regular rate and rhythm  Extremities: 1+ bilateral lower extremity edema   CBC: Recent Labs  Lab 07/18/18 1013 07/19/18 0415 07/20/18 0507  WBC 4.0 3.1* 3.0*  HGB 7.7* 6.5* 9.2*  HCT 23.3* 20.0* 27.1*  MCV 86.0 87.0 85.0  PLT 100* 86* 70*   Basic Metabolic Panel: Recent Labs  Lab 07/16/18 1519  07/18/18 0407 07/19/18 0415 07/20/18 0507  NA 121*   < > 127* 131* 125*  K 4.0   < > 3.6 3.5 3.6  CL 95*   < > 98 98 92*  CO2 16*   < > 22 27 24   GLUCOSE 116*   < > 94 98 138*  BUN 43*   < > 30* 18 24*  CREATININE 4.42*   < > 3.92* 2.93* 3.39*  CALCIUM 8.2*   < > 8.1* 8.0* 8.5*  MG  --   --   --  1.8  --   PHOS 4.6  --   --  3.0  --    < > = values in this interval not displayed.   GFR: Estimated Creatinine Clearance: 12.2 mL/min (A) (by C-G formula based on SCr of 3.39 mg/dL (H)).  Liver Function Tests: Recent Labs  Lab  07/14/18 0124 07/16/18 1519 07/19/18 0415 07/20/18 0507  AST 28  --   --  33  ALT 10  --   --  11  ALKPHOS 46  --   --  45  BILITOT 0.9  --   --  0.9  PROT 7.2  --   --  6.0*  ALBUMIN 2.9* 2.6* 2.3* 2.4*    Recent Labs  Lab 07/14/18 0124  AMMONIA 26    Coagulation Profile: Recent Labs  Lab 07/14/18 0124  INR 0.98   HbA1C: Hemoglobin A1C  Date/Time Value Ref Range Status  05/17/2018 10:27 AM 4.7 4.0 - 5.6 % Final   Hgb A1c MFr Bld  Date/Time Value Ref Range Status  02/28/2017 09:44 AM 5.7 4.6 - 6.5 % Final    Comment:    Glycemic Control Guidelines for People with Diabetes:Non Diabetic:  <6%Goal of Therapy: <7%Additional Action Suggested:  >8%   02/26/2015 10:43 AM 5.8 4.6 - 6.5 % Final     Comment:    Glycemic Control Guidelines for People with Diabetes:Non Diabetic:  <6%Goal of Therapy: <7%Additional Action Suggested:  >8%     CBG: Recent Labs  Lab 07/15/18 1103 07/15/18 1628 07/15/18 2111 07/16/18 0804 07/18/18 1426  GLUCAP 93 102* 107* 93 113*    Recent Results (from the past 240 hour(s))  MRSA PCR Screening     Status: None   Collection Time: 07/16/18 10:41 AM  Result Value Ref Range Status   MRSA by PCR NEGATIVE NEGATIVE Final    Comment:        The GeneXpert MRSA Assay (FDA approved for NASAL specimens only), is one component of a comprehensive MRSA colonization surveillance program. It is not intended to diagnose MRSA infection nor to guide or monitor treatment for MRSA infections. Performed at [facility], [place] [address] [place], [place] [address]      Scheduled Meds: . sodium chloride   Intravenous Once  . ALPRAZolam  0.5 mg Oral QHS  . amLODipine  10 mg Oral Daily  . Chlorhexidine Gluconate Cloth  6 each Topical Q0600  . cholecalciferol  1,000 Units Oral Daily  . cloNIDine  0.1 mg Oral TID  . darbepoetin (ARANESP) injection - DIALYSIS  100 mcg Intravenous Q Thu-HD  . feeding supplement (NEPRO CARB STEADY)  237 mL Oral TID BM  . folic acid  1 mg Oral Daily  . hydrALAZINE  100 mg Oral TID  . hydroxypropyl methylcellulose / hypromellose  1 drop Both Eyes Daily  . ipratropium-albuterol  3 mL Nebulization Q6H  . ketotifen  1 drop Both Eyes BID  . lactulose  20 g Oral Daily  . methylPREDNISolone (SOLU-MEDROL) injection  80 mg Intravenous Q8H  . propranolol  40 mg Oral BID     LOS: 5 days   Cherene Altes, MD [facility] Office  [phone] Pager - Text Page per Amion  If 7PM-7AM, please contact night-coverage per Amion 07/20/2018, 3:16 PM

## 2018-07-21 LAB — CBC
HEMATOCRIT: 28.6 % — AB (ref 36.0–46.0)
Hemoglobin: 9.3 g/dL — ABNORMAL LOW (ref 12.0–15.0)
MCH: 28.9 pg (ref 26.0–34.0)
MCHC: 32.5 g/dL (ref 30.0–36.0)
MCV: 88.8 fL (ref 78.0–100.0)
PLATELETS: 40 10*3/uL — AB (ref 150–400)
RBC: 3.22 MIL/uL — AB (ref 3.87–5.11)
RDW: 14.8 % (ref 11.5–15.5)
WBC: 7.2 10*3/uL (ref 4.0–10.5)

## 2018-07-21 MED ORDER — CHLORHEXIDINE GLUCONATE CLOTH 2 % EX PADS
6.0000 | MEDICATED_PAD | Freq: Every day | CUTANEOUS | Status: DC
  Administered 2018-07-21: 6 via TOPICAL

## 2018-07-21 NOTE — Progress Notes (Signed)
Subjective:  HD yest- was programmed for 4 liters off- not sure exactly how much removed- weight is actually the same but O2 req have decreased- now on RA   Objective Vital signs in last 24 hours: Vitals:   07/20/18 2051 07/21/18 0011 07/21/18 0357 07/21/18 0758  BP: (!) 144/53 (!) 149/50 (!) 145/50 (!) 161/56  Pulse: 66 63 65 64  Resp: 18 14 16 13   Temp: 99 F (37.2 C) 98.9 F (37.2 C) 99 F (37.2 C) 98 F (36.7 C)  TempSrc: Oral Axillary Oral Axillary  SpO2: 94% 96% 96% 96%  Weight:   55.3 kg (122 lb)   Height:       Weight change: -0.746 kg (-1 lb 10.3 oz)  Intake/Output Summary (Last 24 hours) at 07/21/2018 1540 Last data filed at 07/21/2018 0000 Gross per 24 hour  Intake 1090 ml  Output 225 ml  Net 865 ml    Assessment/Plan: 72 year old black female with cirrhosis and advancing CKD.  Admitted with weakness, very high blood pressure-has had hyponatremia not corrected with fluids and worsening CKD 1.Renal-advanced CKD as outpatient- now ESRD,status post AV graft in May with revision 6 weeks ago.  Patient's course of late has featured hospitalizations hyponatremia and weight loss.   I felt the initiation of dialysis may stabilize her medical condition. First treatment via AV graft 7/30, second 8/1-  third 8/3 to minimize new trauma to AVG-  has spot TTS at [place].  Will plan for short treatment tomorrow though given issues with volume  2. Hypertension/volume  -patient with history of malignant hypertension.  At this time she appears volume overloaded which is not helping blood pressure control.  Right now on Norvasc 10, clonidine 0.1 BID, hydralazine 100 3 times daily and Inderal- will add volume removal into regimen and wean meds if able - use clonidine PRN for very high BP 3.  Malnutrition-patient has been losing weight and has very little to spare-alb 2.4- on supps 4. Anemia  -hemoglobin been very low.  Iron stores low as well-last hemoglobin in outpatient was over 11-have  reinitiated  iron and ESA- hgb 6.5 8/2- given one unit now in the 9's. ? GIB - stable last 24 hours 5.  Hyponatremia-now I think is secondary to volume overload.  Dialysis should help correct 6. Bones- last PTH low- no vit D- phos 3.0 no binder  7. SOB-  Seems to have element of reactive airway with wheezing but also effusions and edema- has improved a lot with UF- if cont to have issue may need follow up CXR and possibly a thoracenteses   Plan for HD tomorrow early- if breathing stable and hgb stable she may be able to be discharged as early as Mon afternoon to present to OP center on Tuesday       Sarah Harmon A    Labs: Basic Metabolic Panel: Recent Labs  Lab 07/16/18 1519  07/18/18 0407 07/19/18 0415 07/20/18 0507  NA 121*   < > 127* 131* 125*  K 4.0   < > 3.6 3.5 3.6  CL 95*   < > 98 98 92*  CO2 16*   < > 22 27 24   GLUCOSE 116*   < > 94 98 138*  BUN 43*   < > 30* 18 24*  CREATININE 4.42*   < > 3.92* 2.93* 3.39*  CALCIUM 8.2*   < > 8.1* 8.0* 8.5*  PHOS 4.6  --   --  3.0  --    < > =  values in this interval not displayed.   Liver Function Tests: Recent Labs  Lab 07/16/18 1519 07/19/18 0415 07/20/18 0507  AST  --   --  33  ALT  --   --  11  ALKPHOS  --   --  45  BILITOT  --   --  0.9  PROT  --   --  6.0*  ALBUMIN 2.6* 2.3* 2.4*   No results for input(s): LIPASE, AMYLASE in the last 168 hours. No results for input(s): AMMONIA in the last 168 hours. CBC: Recent Labs  Lab 07/16/18 1518 07/18/18 1013 07/19/18 0415 07/20/18 0507 07/21/18 0354  WBC 4.1 4.0 3.1* 3.0* 7.2  HGB 7.2* 7.7* 6.5* 9.2* 9.3*  HCT 21.4* 23.3* 20.0* 27.1* 28.6*  MCV 84.6 86.0 87.0 85.0 88.8  PLT 127* 100* 86* 70* 40*   Cardiac Enzymes: No results for input(s): CKTOTAL, CKMB, CKMBINDEX, TROPONINI in the last 168 hours. CBG: Recent Labs  Lab 07/15/18 1103 07/15/18 1628 07/15/18 2111 07/16/18 0804 07/18/18 1426  GLUCAP 93 102* 107* 93 113*    Iron Studies:  No results  for input(s): IRON, TIBC, TRANSFERRIN, FERRITIN in the last 72 hours. Studies/Results: Dg Chest Port 1 View  Result Date: 07/19/2018 CLINICAL DATA:  Acute respiratory distress. Follow-up CHF and effusions. EXAM: PORTABLE CHEST 1 VIEW COMPARISON:  07/16/2018, 07/13/2018 and earlier. FINDINGS: Cardiac silhouette enlarged, unchanged. No interval change in the mild diffuse interstitial pulmonary edema. Interval increase in size of the large BILATERAL pleural effusions, LEFT greater than RIGHT. Associated dense consolidation in the lower lobes, lingula and RIGHT MIDDLE LOBE. IMPRESSION: 1. Worsening large BILATERAL pleural effusions, LEFT larger than RIGHT. 2. Stable mild diffuse interstitial pulmonary edema indicating CHF and/or fluid overload. 3. Stable dense passive atelectasis and/or pneumonia involving the BILATERAL LOWER LOBES, the RIGHT MIDDLE LOBE and the lingula. Electronically Signed   By: Evangeline Dakin M.D.   On: 07/19/2018 21:47   Medications: Infusions: . sodium chloride Stopped (07/14/18 0700)  . ferric gluconate (FERRLECIT/NULECIT) IV Stopped (07/20/18 1319)    Scheduled Medications: . ALPRAZolam  0.5 mg Oral QHS  . amLODipine  10 mg Oral Daily  . Chlorhexidine Gluconate Cloth  6 each Topical Q0600  . cholecalciferol  1,000 Units Oral Daily  . cloNIDine  0.2 mg Oral TID  . darbepoetin (ARANESP) injection - DIALYSIS  100 mcg Intravenous Q Thu-HD  . feeding supplement (NEPRO CARB STEADY)  237 mL Oral TID BM  . folic acid  1 mg Oral Daily  . hydrALAZINE  100 mg Oral TID  . hydroxypropyl methylcellulose / hypromellose  1 drop Both Eyes Daily  . ipratropium-albuterol  3 mL Nebulization TID  . ketotifen  1 drop Both Eyes BID  . lactulose  20 g Oral Daily  . methylPREDNISolone (SOLU-MEDROL) injection  80 mg Intravenous Q8H  . propranolol  40 mg Oral BID    have reviewed scheduled and prn medications.  Physical Exam: General: NAD- looks weak Heart: RRR Lungs: exp wheezes- dec BS  at bases  Abdomen: soft, non tender Extremities: pitting edema Dialysis Access: left upper AVG patent - no bruising     07/21/2018,8:33 AM  LOS: 6 days

## 2018-07-21 NOTE — Progress Notes (Signed)
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  Subjective: The patient is dramatically better today.  She is alert pleasant and conversant.  She denies any dyspnea whatsoever.  She denies chest pain nausea vomiting or abdominal pain.  Assessment & Plan:  Hypervolemic hyponatremia - CKD now > ESRD secondary to now ESRD + cirrhosis - has been started on dialysis as of 7/30 - care per Nephrology   Recent Labs  Lab 07/16/18 1519 07/17/18 0456 07/18/18 0407 07/19/18 0415 07/20/18 0507  NA 121* 128* 127* 131* 125*    Acute hypoxic resp failure  Multifactorial w/ bronchospasm, pulmonary edema, anxiety - resolved at this time w/ sats in upper 90s on RA   Hypoproliferative / Fe deficient anemia Started on iron and ESA - transfused 1U PRBC thus far this admission - Hgb holding steady   Alcoholic cirrhosis Continue lactulose - no marked ascites on exam - no encephalopathy at this time   Hypertension BP not yet at goal - no change in meds today - f/u after HD 8/5  DVT prophylaxis: SCDs Code Status: FULL CODE Family Communication: no family present at time of exam  Disposition Plan: anticipate possible d/c home 8/5 after HD tx   Consultants:  Nephrology   Antimicrobials:  none   Objective: Blood pressure (!) 161/56, pulse 64, temperature 98 F (36.7 C), temperature source Axillary, resp. rate 13, height 5\' 3"  (1.6 m), weight 55.3 kg (122 lb), SpO2 98 %.  Intake/Output Summary (Last 24 hours) at 07/21/2018 1444 Last data filed at  07/21/2018 1300 Gross per 24 hour  Intake 1330 ml  Output 25 ml  Net 1305 ml   Filed Weights   07/20/18 0500 07/20/18 0727 07/21/18 0357  Weight: 56.2 kg (124 lb) 55.5 kg (122 lb 5.7 oz) 55.3 kg (122 lb)    Examination: General: no respiratory distress - alert and oriented  Lungs: CTA th/o - no wheezing  Cardiovascular: RRR  Extremities: no signif edema B LE   CBC: Recent Labs  Lab 07/19/18 0415 07/20/18 0507 07/21/18 0354  WBC 3.1* 3.0* 7.2  HGB 6.5* 9.2* 9.3*  HCT 20.0* 27.1* 28.6*  MCV 87.0 85.0 88.8  PLT 86* 70* 40*   Basic Metabolic Panel: Recent Labs  Lab 07/16/18 1519  07/18/18 0407 07/19/18 0415 07/20/18 0507  NA 121*   < > 127* 131* 125*  K 4.0   < > 3.6 3.5 3.6  CL 95*   < > 98 98 92*  CO2 16*   < > 22 27 24   GLUCOSE 116*   < > 94 98 138*  BUN 43*   < > 30* 18 24*  CREATININE 4.42*   < > 3.92* 2.93* 3.39*  CALCIUM 8.2*   < > 8.1* 8.0* 8.5*  MG  --   --   --  1.8  --   PHOS 4.6  --   --  3.0  --    < > = values in this interval not displayed.   GFR: Estimated Creatinine Clearance: 12.2 mL/min (A) (by C-G formula based on SCr of 3.39  mg/dL (H)).  Liver Function Tests: Recent Labs  Lab 07/16/18 1519 07/19/18 0415 07/20/18 0507  AST  --   --  33  ALT  --   --  11  ALKPHOS  --   --  45  BILITOT  --   --  0.9  PROT  --   --  6.0*  ALBUMIN 2.6* 2.3* 2.4*    Recent Results (from the past 240 hour(s))  MRSA PCR Screening     Status: None   Collection Time: 07/16/18 10:41 AM  Result Value Ref Range Status   MRSA by PCR NEGATIVE NEGATIVE Final    Comment:        The GeneXpert MRSA Assay (FDA approved for NASAL specimens only), is one component of a comprehensive MRSA colonization surveillance program. It is not intended to diagnose MRSA infection nor to guide or monitor treatment for MRSA infections. Performed at [facility], [place] [address], [place], [place] [address]      Scheduled Meds: . ALPRAZolam  0.5 mg Oral QHS  .  amLODipine  10 mg Oral Daily  . Chlorhexidine Gluconate Cloth  6 each Topical Q0600  . Chlorhexidine Gluconate Cloth  6 each Topical Q0600  . cholecalciferol  1,000 Units Oral Daily  . cloNIDine  0.2 mg Oral TID  . darbepoetin (ARANESP) injection - DIALYSIS  100 mcg Intravenous Q Thu-HD  . feeding supplement (NEPRO CARB STEADY)  237 mL Oral TID BM  . folic acid  1 mg Oral Daily  . hydrALAZINE  100 mg Oral TID  . hydroxypropyl methylcellulose / hypromellose  1 drop Both Eyes Daily  . ipratropium-albuterol  3 mL Nebulization TID  . ketotifen  1 drop Both Eyes BID  . lactulose  20 g Oral Daily  . methylPREDNISolone (SOLU-MEDROL) injection  80 mg Intravenous Q8H  . propranolol  40 mg Oral BID     LOS: 6 days   Cherene Altes, MD [facility] Office  [phone] Pager - Text Page per Amion  If 7PM-7AM, please contact night-coverage per Amion 07/21/2018, 2:44 PM

## 2018-07-22 LAB — RENAL FUNCTION PANEL
ANION GAP: 8 (ref 5–15)
Albumin: 2.4 g/dL — ABNORMAL LOW (ref 3.5–5.0)
BUN: 38 mg/dL — AB (ref 8–23)
CALCIUM: 8.5 mg/dL — AB (ref 8.9–10.3)
CO2: 26 mmol/L (ref 22–32)
CREATININE: 3.46 mg/dL — AB (ref 0.44–1.00)
Chloride: 93 mmol/L — ABNORMAL LOW (ref 98–111)
GFR calc Af Amer: 14 mL/min — ABNORMAL LOW (ref >60)
GFR calc non Af Amer: 12 mL/min — ABNORMAL LOW (ref >60)
Glucose, Bld: 117 mg/dL — ABNORMAL HIGH (ref 70–99)
PHOSPHORUS: 1.7 mg/dL — AB (ref 2.5–4.6)
Potassium: 4.6 mmol/L (ref 3.5–5.1)
SODIUM: 127 mmol/L — AB (ref 135–145)

## 2018-07-22 LAB — CBC
HCT: 28.8 % — ABNORMAL LOW (ref 36.0–46.0)
HEMOGLOBIN: 9.6 g/dL — AB (ref 12.0–15.0)
MCH: 29.2 pg (ref 26.0–34.0)
MCHC: 33.3 g/dL (ref 30.0–36.0)
MCV: 87.5 fL (ref 78.0–100.0)
Platelets: 80 10*3/uL — ABNORMAL LOW (ref 150–400)
RBC: 3.29 MIL/uL — ABNORMAL LOW (ref 3.87–5.11)
RDW: 14.6 % (ref 11.5–15.5)
WBC: 11.2 10*3/uL — ABNORMAL HIGH (ref 4.0–10.5)

## 2018-07-22 MED ORDER — CHLORHEXIDINE GLUCONATE CLOTH 2 % EX PADS: 6.0000 | MEDICATED_PAD | Freq: Every day | CUTANEOUS | Status: DC

## 2018-07-22 MED ORDER — CLONIDINE HCL 0.1 MG PO TABS
0.1000 mg | ORAL_TABLET | Freq: Three times a day (TID) | ORAL | Status: DC
  Administered 2018-07-22 – 2018-07-23 (×3): 0.1 mg via ORAL
  Filled 2018-07-22 (×3): qty 1

## 2018-07-22 NOTE — Discharge Summary (Signed)
DISCHARGE SUMMARY  Sarah Harmon  MR#: [id number]  DOB:[birth year]  Date of Admission: 07/13/2018 Date of Discharge: 07/23/2018  Attending Physician:Rohin Krejci Hennie Duos, MD  Patient's FBP:ZWCH, Hunt Oris, MD  Consults:  Nephrology   Disposition: D/C home   Follow-up Appts: Follow-up Information    Biagio Borg, MD Follow up in 10 day(s).   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  Hospital Course:  CKD now > ESRD has been started on dialysis as of 7/30 - care per Nephrology during hospital stay - all arrangements have been made for her to begin outpt HD starting 07/25/18  Hypervolemic hyponatremia  secondary to now ESRD + cirrhosis - Na stable on HD and improved to 131 at time of dc   Acute hypoxic resp failure  Multifactorial w/ bronchospasm, pulmonary edema, anxiety - resolved at time of d/c w/ sats in upper 90s on RA   Hypoproliferative / Fe deficient anemia Started on iron and ESA - transfused 1U PRBC this admission - Hgb holding steady at time of d/c   Alcoholic cirrhosis Continue lactulose - no marked ascites on exam - no encephalopathy at time of d/c   Hypertension BP much improved after series of HD treatments    Allergies as of 07/23/2018   No Known Allergies     Medication List    STOP taking these medications   citalopram 20 MG tablet Commonly  known as:  CELEXA   ENSURE   feeding supplement (NEPRO CARB STEADY) Liqd   furosemide 40 MG tablet Commonly known as:  LASIX   HYDROcodone-acetaminophen 5-325 MG tablet Commonly known as:  NORCO   isosorbide dinitrate 30 MG tablet Commonly known as:  ISORDIL     TAKE these medications   ALPRAZolam 0.5 MG tablet Commonly known as:  XANAX TAKE 1 TABLET BY MOUTH ONCE DAILY AT BEDTIME   amLODipine 10 MG tablet Commonly known as:  NORVASC TAKE 1 TABLET BY MOUTH ONCE DAILY   cloNIDine 0.1 MG tablet Commonly known as:  CATAPRES Take 1 tablet (0.1 mg total) by mouth 3 (three) times daily. What changed:  when to take this   Darbepoetin Alfa 100 MCG/0.5ML Sosy injection Commonly known as:  ARANESP Inject 0.5 mLs (100 mcg total) into the vein every Thursday with hemodialysis. Start taking on:  03/21/1539   folic acid 1 MG tablet Commonly known as:  FOLVITE TAKE 1 TABLET BY MOUTH ONCE DAILY   hydrALAZINE 100 MG tablet Commonly known as:  APRESOLINE TAKE 1 TABLET BY MOUTH THREE TIMES DAILY   ketotifen 0.025 % ophthalmic solution Commonly known as:  ZADITOR Place 1 drop into both eyes 2 (two) times daily.   lactulose 10 GM/15ML solution Commonly known as:  CHRONULAC Take 30 mLs (20 g total) by mouth daily.   propranolol 40 MG tablet Commonly known as:  INDERAL Take 1 tablet (40 mg total) by mouth 2 (two) times daily.   SYSTANE BALANCE 0.6 % Soln Generic drug:  Propylene Glycol Place 1 drop into both eyes daily.   Vitamin D3 1000 units Caps Take 1,000 Units by mouth daily.       Day of Discharge BP (!) 113/56 (BP Location: Right Arm)   Pulse 63   Temp 97.8 F (36.6 C) (Oral)   Resp 14   Ht 5\' 3"  (1.6 m)   Wt 51.3 kg (113 lb 1.5 oz)   SpO2 98%   BMI 20.03 kg/m   Physical Exam: General: No acute respiratory distress Lungs: Clear to auscultation bilaterally without wheezes or crackles Cardiovascular: Regular rate and rhythm without murmur gallop or rub  normal S1 and S2 Abdomen: Nontender, nondistended, soft, bowel sounds positive Extremities: No significant cyanosis, clubbing, or edema bilateral lower extremities  Basic Metabolic Panel: Recent Labs  Lab 07/16/18 1519  07/18/18 0407 07/19/18 0415 07/20/18 0507 07/22/18 0658 07/23/18 0651  NA 121*   < > 127* 131* 125* 127* 131*  K 4.0   < > 3.6 3.5 3.6 4.6 3.1*  CL 95*   < > 98 98 92* 93* 93*  CO2 16*   < > 22 27 24 26 27   GLUCOSE 116*   < > 94 98 138* 117* 108*  BUN 43*   < > 30* 18 24* 38* 34*  CREATININE 4.42*   < > 3.92* 2.93* 3.39* 3.46* 3.06*  CALCIUM 8.2*   < > 8.1* 8.0* 8.5* 8.5* 8.3*  MG  --   --   --  1.8  --   --   --   PHOS 4.6  --   --  3.0  --  1.7* 1.2*   < > = values in this interval not displayed.    Liver Function Tests: Recent Labs  Lab 07/16/18 1519 07/19/18 0415 07/20/18 0507 07/22/18 0658 07/23/18 0651  AST  --   --  33  --   --   ALT  --   --  11  --   --   ALKPHOS  --   --  45  --   --   BILITOT  --   --  0.9  --   --   PROT  --   --  6.0*  --   --   ALBUMIN 2.6* 2.3* 2.4* 2.4* 2.2*    CBC: Recent Labs  Lab 07/19/18 0415 07/20/18 0507 07/21/18 0354 07/22/18 0411 07/23/18 0635  WBC 3.1* 3.0* 7.2 11.2* 8.5  HGB 6.5* 9.2* 9.3* 9.6* 9.5*  HCT 20.0* 27.1* 28.6* 28.8* 28.7*  MCV 87.0 85.0 88.8 87.5 88.9  PLT 86* 70* 40* 80* 60*    CBG: Recent Labs  Lab 07/18/18 1426  GLUCAP 113*    Recent Results (from the past 240 hour(s))  MRSA PCR Screening     Status: None   Collection Time: 07/16/18 10:41 AM  Result Value Ref Range Status   MRSA by PCR NEGATIVE NEGATIVE Final    Comment:        The GeneXpert MRSA Assay (FDA approved for NASAL specimens only), is one component of a comprehensive MRSA colonization surveillance program. It is not intended to diagnose MRSA infection nor to guide or monitor treatment for MRSA infections. Performed at [facility], [place] [address] [place],  [address]      Time spent in  discharge (includes decision making & examination of pt): 35 minutes  07/23/2018, 2:32 PM   Cherene Altes, MD [facility] Office  [phone] Pager [phone]  On-Call/Text Page:      [email or web address]      password [facility]

## 2018-07-22 NOTE — Progress Notes (Signed)
Nutrition Follow-up  DOCUMENTATION CODES:   Non-severe (moderate) malnutrition in context of chronic illness  INTERVENTION:   - Continue Nepro Shake po TID, each supplement provides 425 kcal and 19 grams protein (vanilla)  - Recommend renal MVI daily  NUTRITION DIAGNOSIS:   Moderate Malnutrition related to poor appetite, chronic illness (ESRD, alcoholic liver cirrhosis) as evidenced by moderate fat depletion, severe fat depletion, moderate muscle depletion, severe muscle depletion.  Ongoing  GOAL:   Patient will meet greater than or equal to 90% of their needs  Progressing  MONITOR:   PO intake, Supplement acceptance, I & O's, Labs, Weight trends  REASON FOR ASSESSMENT:   Malnutrition Screening Tool    ASSESSMENT:   72 year old female who presented to the ED with weakness and hypertension. PMH significant for ESRD not yet on HD, hypertension, alcoholic liver cirrhosis, hepatocellular cancer s/p surgery (no chemoradiation, now cancer-free), EtOH abuse in remession, hyperlipidemia, and chronic hyponatremia.  7/30 - first HD 8/1, 8/3, 8/5 - subsequent HD  Pt resting at time of visit and states that she is "feeling better" after HD this morning.  Pt reports that she is "doing better" with her meals. Noted 50% completed lunch meal tray at bedside at time of visit. Pt reports she did not have breakfast this morning, stating "I missed my grits." Pt unsure why she did not receive her breakfast. RD to follow to find out if this is a consistent trend. Encouraged pt to ask for a meal tray if she is off the unit at HD during a meal time.  Noted 1 completed Nepro at pt's bedside and 1 unopened Nepro. Pt reiterated that she prefers the vanilla flavor. RD provided pt with a vanilla Nepro at time of visit along with a cup of ice. Made RN aware.  Noted that pt's weight has fluctuated between 117-124 lbs since admission. Suspect weight fluctuations related to fluid status. Current weight  is 188 lbs which was pt's weight on admission.  Meal Completion: 0-50%  Medications reviewed and include: 1000 units vitamin D daily, Nepro TID, 1 mg folic acid, 20 grams lactulose daily  Labs reviewed: sodium 127 (L), chloride 93 (L), BUN 38 (H), creatinine 3.46 (H), phosphorus 1.7 (L), hemoglobin 9.6 (L), HCT 28.8 (L)  Diet Order:   Diet Order           Diet renal with fluid restriction Fluid restriction: 1200 mL Fluid; Room service appropriate? Yes; Fluid consistency: Thin  Diet effective now          EDUCATION NEEDS:   No education needs have been identified at this time  Skin:  Skin Assessment: Reviewed RN Assessment  Last BM:  07/21/18  Height:   Ht Readings from Last 1 Encounters:  07/13/18 5\' 3"  (1.6 m)    Weight:   Wt Readings from Last 1 Encounters:  07/22/18 118 lb 9.7 oz (53.8 kg)    Ideal Body Weight:  52.27 kg  BMI:  Body mass index is 21.01 kg/m.  Estimated Nutritional Needs:   Kcal:  1650-1850 kcal/day   Protein:  70-85 grams/day   Fluid:  UOP + 1000 ml    Gaynell Face, MS, RD, LDN Pager: [phone] Weekend/After Hours: [phone]

## 2018-07-22 NOTE — Progress Notes (Signed)
Occupational Therapy Treatment Patient Details Name: Sarah Harmon MRN: [id number] DOB: [birth year] Today's Date: 07/22/2018    History of present illness Pt is a 72 y.o. female admitted 07/13/18 with generalized weakness; worked up for hyponatremia and fluid overload. Pt began HD on 07/15/2018. PMH includes HTN, alcoholic cirrhosis, hepatocellular carcinoma, ESRD, depression.   OT comments  Patient progressing well.  On room air today with oxygen saturations maintained above 95%.  She completes bed mobility with modified independence, transfers with supervision and mobility with close supervision using no AD.  Educated on safety with tub transfers and educated on energy conservation techniques.  Patient reports her son will be assisting her at discharge. Continues to be limited by decreased activity tolerance, decreased endurance, and safety awareness. Will continue to follow while admitted.    Follow Up Recommendations  No OT follow up;Supervision/Assistance - 24 hour    Equipment Recommendations  None recommended by OT    Recommendations for Other Services PT consult    Precautions / Restrictions Precautions Precautions: Fall Restrictions Weight Bearing Restrictions: No       Mobility Bed Mobility Overal bed mobility: Independent                Transfers Overall transfer level: Needs assistance Equipment used: None Transfers: Sit to/from Stand Sit to Stand: Supervision         General transfer comment: supervision for safety    Balance Overall balance assessment: Needs assistance Sitting-balance support: No upper extremity supported;Feet supported Sitting balance-Leahy Scale: Good     Standing balance support: No upper extremity supported;During functional activity Standing balance-Leahy Scale: Fair                             ADL either performed or assessed with clinical judgement   ADL Overall ADL's : Needs assistance/impaired                          Toilet Transfer: Supervision/safety;Ambulation;Cueing for safety(simulated in room ) Toilet Transfer Details (indicate cue type and reason): verbal cueing for safety and pacing      Tub/ Shower Transfer: Tub transfer;Shower seat;Ambulation;Min guard;Cueing for safety Tub/Shower Transfer Details (indicate cue type and reason): simulated tub transfer, educated on technique and safety when stepping over tub Functional mobility during ADLs: Min guard General ADL Comments: supervision for short distance mobility and transfers in room, reviewed energy conservation techniques and safety     Vision       Perception     Praxis      Cognition Arousal/Alertness: Awake/alert Behavior During Therapy: WFL for tasks assessed/performed Overall Cognitive Status: Impaired/Different from baseline Area of Impairment: Memory                     Memory: Decreased short-term memory                  Exercises     Shoulder Instructions       General Comments pt fatigued from HD this morning     Pertinent Vitals/ Pain       Pain Assessment: No/denies pain  Home Living                                          Prior Functioning/Environment  Frequency  Min 2X/week        Progress Toward Goals  OT Goals(current goals can now be found in the care plan section)  Progress towards OT goals: Progressing toward goals  Acute Rehab OT Goals Patient Stated Goal: "Go home" OT Goal Formulation: With patient Time For Goal Achievement: 07/29/18 Potential to Achieve Goals: Good  Plan Discharge plan remains appropriate;Frequency remains appropriate    Co-evaluation                 AM-PAC PT "6 Clicks" Daily Activity     Outcome Measure   Help from another person eating meals?: None Help from another person taking care of personal grooming?: None Help from another person toileting, which includes using toliet,  bedpan, or urinal?: A Little Help from another person bathing (including washing, rinsing, drying)?: None Help from another person to put on and taking off regular upper body clothing?: None Help from another person to put on and taking off regular lower body clothing?: None 6 Click Score: 23    End of Session    OT Visit Diagnosis: Unsteadiness on feet (R26.81);Other abnormalities of gait and mobility (R26.89);Muscle weakness (generalized) (M62.81)   Activity Tolerance Patient tolerated treatment well   Patient Left in bed;with call bell/phone within reach;with nursing/sitter in room   Nurse Communication Mobility status        Time: 0254-2706 OT Time Calculation (min): 19 min  Charges: OT General Charges $OT Visit: 1 Visit OT Treatments $Self Care/Home Management : 8-22 mins  Delight Stare, OTR/L  Pager [place] 07/22/2018, 4:09 PM

## 2018-07-22 NOTE — Progress Notes (Signed)
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  Subjective: Resting comfortably in bed.  No new complaints.  Denies cp or sob.    Assessment & Plan:  CKD now > ESRD has been started on dialysis as of 7/30 - care per Nephrology   Hypervolemic hyponatremia  secondary to now ESRD + cirrhosis - stable   Recent Labs  Lab 07/17/18 0456 07/18/18 0407 07/19/18 0415 07/20/18 0507 07/22/18 0658  NA 128* 127* 131* 125* 127*    Acute hypoxic resp failure  Multifactorial w/ bronchospasm, pulmonary edema, anxiety - resolved w/ sats in upper 90s on RA   Hypoproliferative / Fe deficient anemia Started on iron and ESA - transfused 1U PRBC thus far this admission - Hgb holding steady   Alcoholic cirrhosis Continue lactulose - no marked ascites on exam - no encephalopathy at this time   Hypertension BP dipped today w/ HD - decrease dose of clonidine today and follow   DVT prophylaxis: SCDs Code Status: FULL CODE Family Communication: no family present at time of exam  Disposition Plan: anticipate possible d/c home 8/6 after AM HD tx   Consultants:  Nephrology   Antimicrobials:  none   Objective: Blood pressure (!) 110/57, pulse (!) 54, temperature 98.2 F (36.8 C), temperature source Oral, resp. rate 18, height 5\' 3"  (1.6 m), weight 51.9 kg (114 lb 6.7 oz), SpO2 98 %.  Intake/Output Summary (Last 24 hours) at 07/22/2018 1642 Last data filed at 07/22/2018 1300 Gross per 24 hour  Intake 600 ml  Output 1900 ml  Net -1300 ml   Filed  Weights   07/22/18 0416 07/22/18 0815 07/22/18 1200  Weight: 56 kg (123 lb 6.4 oz) 53.8 kg (118 lb 9.7 oz) 51.9 kg (114 lb 6.7 oz)    Examination: General: no respiratory distress Lungs: CTA th/o  Cardiovascular: RRR  Extremities: no signif edema B LE   CBC: Recent Labs  Lab 07/20/18 0507 07/21/18 0354 07/22/18 0411  WBC 3.0* 7.2 11.2*  HGB 9.2* 9.3* 9.6*  HCT 27.1* 28.6* 28.8*  MCV 85.0 88.8 87.5  PLT 70* 40* 80*   Basic Metabolic Panel: Recent Labs  Lab 07/16/18 1519  07/19/18 0415 07/20/18 0507 07/22/18 0658  NA 121*   < > 131* 125* 127*  K 4.0   < > 3.5 3.6 4.6  CL 95*   < > 98 92* 93*  CO2 16*   < > 27 24 26   GLUCOSE 116*   < > 98 138* 117*  BUN 43*   < > 18 24* 38*  CREATININE 4.42*   < > 2.93* 3.39* 3.46*  CALCIUM 8.2*   < > 8.0* 8.5* 8.5*  MG  --   --  1.8  --   --   PHOS 4.6  --  3.0  --  1.7*   < > = values in this interval not displayed.   GFR: Estimated Creatinine Clearance: 11.9 mL/min (A) (by C-G formula based on SCr of 3.46 mg/dL (H)).  Liver Function Tests: Recent Labs  Lab 07/16/18 1519 07/19/18 0415 07/20/18 0507 07/22/18 2035  AST  --   --  33  --   ALT  --   --  11  --   ALKPHOS  --   --  45  --   BILITOT  --   --  0.9  --   PROT  --   --  6.0*  --   ALBUMIN 2.6* 2.3* 2.4* 2.4*    Recent Results (from the past 240 hour(s))  MRSA PCR Screening     Status: None   Collection Time: 07/16/18 10:41 AM  Result Value Ref Range Status   MRSA by PCR NEGATIVE NEGATIVE Final    Comment:        The GeneXpert MRSA Assay (FDA approved for NASAL specimens only), is one component of a comprehensive MRSA colonization surveillance program. It is not intended to diagnose MRSA infection nor to guide or monitor treatment for MRSA infections. Performed at [facility], [place] [address] [place], [place] [address]      Scheduled Meds: . ALPRAZolam  0.5 mg Oral QHS  . amLODipine  10 mg Oral Daily  . Chlorhexidine Gluconate Cloth  6 each  Topical Q0600  . Chlorhexidine Gluconate Cloth  6 each Topical Q0600  . cholecalciferol  1,000 Units Oral Daily  . cloNIDine  0.1 mg Oral TID  . darbepoetin (ARANESP) injection - DIALYSIS  100 mcg Intravenous Q Thu-HD  . feeding supplement (NEPRO CARB STEADY)  237 mL Oral TID BM  . folic acid  1 mg Oral Daily  . hydrALAZINE  100 mg Oral TID  . hydroxypropyl methylcellulose / hypromellose  1 drop Both Eyes Daily  . ipratropium-albuterol  3 mL Nebulization TID  . ketotifen  1 drop Both Eyes BID  . lactulose  20 g Oral Daily  . propranolol  40 mg Oral BID     LOS: 7 days   Cherene Altes, MD [facility] Office  [phone] Pager - Text Page per Amion  If 7PM-7AM, please contact night-coverage per Amion 07/22/2018, 4:42 PM

## 2018-07-22 NOTE — Progress Notes (Addendum)
[facility] Progress Note   Subjective: Awake, alert, no C/Os. Was to be discharged today and start HD at OP center tomorrow, however daughter is out of town. She will have HD here first shift and be DC'd home tomorrow.     Objective Vitals:   07/22/18 1100 07/22/18 1130 07/22/18 1200 07/22/18 1326  BP: (!) 86/42 (!) 88/45 (!) 127/59 (!) 110/57  Pulse: (!) 56 (!) 57 (!) 56 (!) 54  Resp: 20 19 18    Temp:   98.2 F (36.8 C)   TempSrc:   Oral   SpO2:   98%   Weight:   51.9 kg (114 lb 6.7 oz)   Height:       Physical Exam General: Frail appearing elderly female in NAD Heart: S1,S2, RRR Lungs: slightly decreased in bases R>L otherwise CTAB Abdomen: soft, NT Extremities: pitting edema Dialysis Access: LUA AVG + bruit   Additional Objective Labs: Basic Metabolic Panel: Recent Labs  Lab 07/16/18 1519  07/19/18 0415 07/20/18 0507 07/22/18 0658  NA 121*   < > 131* 125* 127*  K 4.0   < > 3.5 3.6 4.6  CL 95*   < > 98 92* 93*  CO2 16*   < > 27 24 26   GLUCOSE 116*   < > 98 138* 117*  BUN 43*   < > 18 24* 38*  CREATININE 4.42*   < > 2.93* 3.39* 3.46*  CALCIUM 8.2*   < > 8.0* 8.5* 8.5*  PHOS 4.6  --  3.0  --  1.7*   < > = values in this interval not displayed.   Liver Function Tests: Recent Labs  Lab 07/19/18 0415 07/20/18 0507 07/22/18 0658  AST  --  33  --   ALT  --  11  --   ALKPHOS  --  45  --   BILITOT  --  0.9  --   PROT  --  6.0*  --   ALBUMIN 2.3* 2.4* 2.4*   No results for input(s): LIPASE, AMYLASE in the last 168 hours. CBC: Recent Labs  Lab 07/18/18 1013 07/19/18 0415 07/20/18 0507 07/21/18 0354 07/22/18 0411  WBC 4.0 3.1* 3.0* 7.2 11.2*  HGB 7.7* 6.5* 9.2* 9.3* 9.6*  HCT 23.3* 20.0* 27.1* 28.6* 28.8*  MCV 86.0 87.0 85.0 88.8 87.5  PLT 100* 86* 70* 40* 80*   Blood Culture    Component Value Date/Time   SDES STOOL 08/08/2007 1345   SPECREQUEST NONE 08/08/2007 1345   CULT  08/08/2007 1000    NO SALMONELLA, SHIGELLA,  CAMPYLOBACTER, OR YERSINIA ISOLATED   REPTSTATUS 08/09/2007 FINAL 08/08/2007 1345    Cardiac Enzymes: No results for input(s): CKTOTAL, CKMB, CKMBINDEX, TROPONINI in the last 168 hours. CBG: Recent Labs  Lab 07/15/18 2111 07/16/18 0804 07/18/18 1426  GLUCAP 107* 93 113*   Iron Studies: No results for input(s): IRON, TIBC, TRANSFERRIN, FERRITIN in the last 72 hours. @lablastinr3 @ Studies/Results: No results found. Medications: . sodium chloride Stopped (07/14/18 0700)   . ALPRAZolam  0.5 mg Oral QHS  . amLODipine  10 mg Oral Daily  . Chlorhexidine Gluconate Cloth  6 each Topical Q0600  . Chlorhexidine Gluconate Cloth  6 each Topical Q0600  . cholecalciferol  1,000 Units Oral Daily  . cloNIDine  0.1 mg Oral TID  . darbepoetin (ARANESP) injection - DIALYSIS  100 mcg Intravenous Q Thu-HD  . feeding supplement (NEPRO CARB STEADY)  237 mL Oral TID BM  . folic acid  1 mg Oral Daily  . hydrALAZINE  100 mg Oral TID  . hydroxypropyl methylcellulose / hypromellose  1 drop Both Eyes Daily  . ipratropium-albuterol  3 mL Nebulization TID  . ketotifen  1 drop Both Eyes BID  . lactulose  20 g Oral Daily  . propranolol  40 mg Oral BID     Assessment/Plan:72 year old black female with cirrhosis and advancing CKD. Admitted with weakness, very high blood pressure-has had hyponatremia not corrected with fluids and worsening CKD. 1.Renal-advanced CKD as outpatient- now ESRD,status post AV graft in May with revision 6 weeks ago. Patient's course of late has featured hospitalizations hyponatremia and weight loss. I felt the initiation of dialysis may stabilize her medical condition.First treatment via AV graft 7/30, second 8/1-  third 8/3 to minimize new trauma to AVG-  has spot TTS at [place]. Had short treatment today for volume removal. Plans to DC tomorrow however daughter is out of town. She will have HD here tomorrow and then start at [facility] Thursday.   2. Hypertension/volume-patient with  history of malignant hypertension. At this time she appears volume overloaded which is not helping blood pressure control.Right now on Norvasc 10, clonidine 0.1 BID, hydralazine 100 3 times daily and Inderal- will add volume removal into regimen and wean meds if able - use clonidine PRN for very high BP. Clonidine decreased per primary. HD today pre wt 53.8 kg Net UF 1.9 liters post wt 51.9 kg.  3.Malnutrition-patient has been losing weight and has very little to spare-alb 2.4- on supps 4. Anemia-hemoglobin been very low. Iron stores low as well-last hemoglobin in outpatient was over 11-have reinitiated  iron and ESA- hgb 6.5 8/2- given one unit now in the 9's. ? GIB - stable last 24 hours 5.Hyponatremia-now I think is secondary to volume overload. Dialysis should help correct but seems slow to respond.  6. Bones- last PTH low- no vit D- phos 3.0 no binder  7. SOB-  Seems to have element of reactive airway with wheezing but also effusions and edema- has improved a lot with UF- if cont to have issue may need follow up CXR and possibly a thoracenteses    Sarah H. Brown NP-C 07/22/2018, 4:41 PM  [facility] [phone]  Pt seen, examined and agree w A/P as above.  Sarah Splinter MD [employer] pager [phone]   07/23/2018, 8:30 AM

## 2018-07-22 NOTE — Progress Notes (Signed)
   07/22/18 1100  PT Visit Information  Last PT Received On 07/22/18  Reason Eval/Treat Not Completed Patient at procedure or test/unavailable  Pt in HD and will try later as pt and time allow.  Mee Hives, PT MS Acute Rehab Dept. Number: [place] and [place]

## 2018-07-23 ENCOUNTER — Ambulatory Visit (HOSPITAL_COMMUNITY): Payer: Medicare Other

## 2018-07-23 ENCOUNTER — Other Ambulatory Visit: Payer: Medicare Other

## 2018-07-23 LAB — CBC
HCT: 28.7 % — ABNORMAL LOW (ref 36.0–46.0)
Hemoglobin: 9.5 g/dL — ABNORMAL LOW (ref 12.0–15.0)
MCH: 29.4 pg (ref 26.0–34.0)
MCHC: 33.1 g/dL (ref 30.0–36.0)
MCV: 88.9 fL (ref 78.0–100.0)
Platelets: 60 10*3/uL — ABNORMAL LOW (ref 150–400)
RBC: 3.23 MIL/uL — ABNORMAL LOW (ref 3.87–5.11)
RDW: 14.9 % (ref 11.5–15.5)
WBC: 8.5 10*3/uL (ref 4.0–10.5)

## 2018-07-23 LAB — RENAL FUNCTION PANEL
Albumin: 2.2 g/dL — ABNORMAL LOW (ref 3.5–5.0)
Anion gap: 11 (ref 5–15)
BUN: 34 mg/dL — ABNORMAL HIGH (ref 8–23)
CO2: 27 mmol/L (ref 22–32)
Calcium: 8.3 mg/dL — ABNORMAL LOW (ref 8.9–10.3)
Chloride: 93 mmol/L — ABNORMAL LOW (ref 98–111)
Creatinine, Ser: 3.06 mg/dL — ABNORMAL HIGH (ref 0.44–1.00)
GFR calc Af Amer: 16 mL/min — ABNORMAL LOW (ref >60)
GFR calc non Af Amer: 14 mL/min — ABNORMAL LOW (ref >60)
Glucose, Bld: 108 mg/dL — ABNORMAL HIGH (ref 70–99)
Phosphorus: 1.2 mg/dL — ABNORMAL LOW (ref 2.5–4.6)
Potassium: 3.1 mmol/L — ABNORMAL LOW (ref 3.5–5.1)
Sodium: 131 mmol/L — ABNORMAL LOW (ref 135–145)

## 2018-07-23 MED ORDER — HEPARIN SODIUM (PORCINE) 1000 UNIT/ML DIALYSIS
1000.0000 [IU] | INTRAMUSCULAR | Status: DC | PRN
  Filled 2018-07-23: qty 1

## 2018-07-23 MED ORDER — SODIUM CHLORIDE 0.9 % IV SOLN: 100.0000 mL | INTRAVENOUS | Status: DC | PRN

## 2018-07-23 MED ORDER — DARBEPOETIN ALFA 100 MCG/0.5ML IJ SOSY: 100.0000 ug | PREFILLED_SYRINGE | INTRAMUSCULAR | Status: AC

## 2018-07-23 MED ORDER — HEPARIN SODIUM (PORCINE) 1000 UNIT/ML DIALYSIS
20.0000 [IU]/kg | INTRAMUSCULAR | Status: DC | PRN
  Administered 2018-07-23: 1000 [IU] via INTRAVENOUS_CENTRAL
  Filled 2018-07-23 (×2): qty 1

## 2018-07-23 MED ORDER — ALTEPLASE 2 MG IJ SOLR: 2.0000 mg | Freq: Once | INTRAMUSCULAR | Status: DC | PRN

## 2018-07-23 MED ORDER — ALBUTEROL SULFATE (2.5 MG/3ML) 0.083% IN NEBU: 2.5000 mg | INHALATION_SOLUTION | RESPIRATORY_TRACT | Status: DC | PRN

## 2018-07-23 MED ORDER — LIDOCAINE HCL (PF) 1 % IJ SOLN: 5.0000 mL | INTRAMUSCULAR | Status: DC | PRN

## 2018-07-23 MED ORDER — PENTAFLUOROPROP-TETRAFLUOROETH EX AERO
1.0000 "application " | INHALATION_SPRAY | CUTANEOUS | Status: DC | PRN
  Administered 2018-07-23: 1 via TOPICAL

## 2018-07-23 MED ORDER — LIDOCAINE-PRILOCAINE 2.5-2.5 % EX CREA
1.0000 "application " | TOPICAL_CREAM | CUTANEOUS | Status: DC | PRN
  Filled 2018-07-23: qty 5

## 2018-07-23 MED ORDER — CLONIDINE HCL 0.1 MG PO TABS: 0.1000 mg | ORAL_TABLET | Freq: Three times a day (TID) | ORAL | 0 refills | Status: DC

## 2018-07-23 NOTE — Progress Notes (Signed)
Physical Therapy Treatment Patient Details Name: Sarah Harmon MRN: [id number] DOB: [birth year] Today's Date: 07/23/2018    History of Present Illness Pt is a 72 y.o. female admitted 07/13/18 with generalized weakness; worked up for hyponatremia and fluid overload. Pt began HD on 07/15/2018. PMH includes HTN, alcoholic cirrhosis, hepatocellular carcinoma, ESRD, depression.    PT Comments    Pt found waiting on EoB for d/c and agreeable to therapy to perform stair training. Pt ambulated without AD with supervision progressing to min guard with use of handrails in hallway with fatigue after 40 feet of ambulation. Pt educated on possible need for using AD after HD due to increased fatigued. Discussed how pt was going to get into home at d/c, pt reports her son will be there to help her in when she d/c home today.    Follow Up Recommendations  Home health PT;Supervision/Assistance - 24 hour     Equipment Recommendations  Other (comment)(rollator)    Recommendations for Other Services       Precautions / Restrictions Precautions Precautions: Fall Precaution Comments: watch sats Restrictions Weight Bearing Restrictions: No    Mobility  Bed Mobility               General bed mobility comments: sitting EoB on entry   Transfers Overall transfer level: Needs assistance   Transfers: Sit to/from Stand Sit to Stand: Modified independent (Device/Increase time)         General transfer comment: increased effort  Ambulation/Gait Ambulation/Gait assistance: Min guard;Supervision Gait Distance (Feet): 80 Feet   Gait Pattern/deviations: Step-through pattern;Decreased stride length Gait velocity: decr Gait velocity interpretation: 1.31 - 2.62 ft/sec, indicative of limited community ambulator General Gait Details: able to ambulate without support initially but then required intermittent use of handrail in hallway. Educated pt in possible need for use of RW after HD to insure  stability   Stairs         General stair comments: fatigue limited ambulation to stairs, pt states her son will be able to help her into the house at d/c    Wheelchair Mobility    Modified Rankin (Stroke Patients Only)       Balance Overall balance assessment: Needs assistance Sitting-balance support: No upper extremity supported;Feet supported Sitting balance-Leahy Scale: Good     Standing balance support: No upper extremity supported;During functional activity Standing balance-Leahy Scale: Fair                              Cognition Arousal/Alertness: Awake/alert Behavior During Therapy: WFL for tasks assessed/performed Overall Cognitive Status: Impaired/Different from baseline Area of Impairment: Memory                     Memory: Decreased short-term memory                   General Comments General comments (skin integrity, edema, etc.): VSS, pt fatigues with HD.       Pertinent Vitals/Pain Pain Assessment: No/denies pain           PT Goals (current goals can now be found in the care plan section) Acute Rehab PT Goals PT Goal Formulation: With patient Time For Goal Achievement: 07/28/18 Potential to Achieve Goals: Good Progress towards PT goals: Progressing toward goals    Frequency    Min 3X/week      PT Plan Current plan remains appropriate  AM-PAC PT "6 Clicks" Daily Activity  Outcome Measure  Difficulty turning over in bed (including adjusting bedclothes, sheets and blankets)?: None Difficulty moving from lying on back to sitting on the side of the bed? : None Difficulty sitting down on and standing up from a chair with arms (e.g., wheelchair, bedside commode, etc,.)?: A Little Help needed moving to and from a bed to chair (including a wheelchair)?: A Little Help needed walking in hospital room?: A Little Help needed climbing 3-5 steps with a railing? : A Little 6 Click Score: 20    End of Session  Equipment Utilized During Treatment: Gait belt Activity Tolerance: Patient tolerated treatment well Patient left: in bed;with nursing/sitter in room Nurse Communication: Mobility status PT Visit Diagnosis: Other abnormalities of gait and mobility (R26.89);Muscle weakness (generalized) (M62.81)     Time: 6282-4175 PT Time Calculation (min) (ACUTE ONLY): 10 min  Charges:  $Gait Training: 8-22 mins                     Zadia Uhde B. Migdalia Dk PT, DPT Acute Rehabilitation  [phone] Pager [phone]     [place] 07/23/2018, 3:44 PM

## 2018-07-23 NOTE — Discharge Instructions (Signed)
Dialysis Dialysis is a procedure that replaces some of the work healthy kidneys do. It is done when you lose about 85-90% of your kidney function. It may also be done earlier if your symptoms may be improved by dialysis. During dialysis, wastes, salt, and extra water are removed from the blood, and the levels of certain chemicals in the blood (such as potassium) are maintained. Dialysis is done in sessions. Dialysis sessions are continued until the kidneys get better. If the kidneys cannot get better, such as in end-stage kidney disease, dialysis is continued for life or until you receive a new kidney (kidney transplant). There are two types of dialysis: hemodialysis and peritoneal dialysis. What is hemodialysis? Hemodialysis is a type of dialysis in which a machine called a dialyzer is used to filter the blood. Before beginning hemodialysis, you will have surgery to create a site where blood can be removed from the body and returned to the body (vascular access). There are three types of vascular accesses:  Arteriovenous fistula. To create this type of access, an artery is connected to a vein (usually in the arm). A fistula takes 1-6 months to develop after surgery. If it develops properly, it usually lasts longer than the other types of vascular accesses. It is also less likely to become infected and cause blood clots.  Arteriovenous graft. To create this type of access, an artery and a vein in the arm are connected with a tube. A graft may be used within 2-3 weeks of surgery.  A venous catheter. To create this type of access, a thin, flexible tube (catheter) is placed in a large vein in your neck, chest, or groin. A catheter may be used right away. It is usually used as a temporary access when dialysis needs to begin immediately.  During hemodialysis, blood leaves the body through your access. It travels through a tube to the dialyzer, where it is filtered. The blood then returns to your body through  another tube. Hemodialysis is usually performed by a health care provider at a hospital or dialysis center three times a week. Visits last about 3-4 hours. It may also be performed with the help of another person at home with training. What is peritoneal dialysis? Peritoneal dialysis is a type of dialysis in which the thin lining of the abdomen (peritoneum) is used as a filter. Before beginning peritoneal dialysis, you will have surgery to place a catheter in your abdomen. The catheter will be used to transfer a fluid called dialysate to and from your abdomen. At the start of a session, your abdomen is filled with dialysate. During the session, wastes, salt, and extra water in the blood pass through the peritoneum and into the dialysate. The dialysate is drained from the body at the end of the session. The process of filling and draining the dialysate is called an exchange. Exchanges are repeated until you have used up all the dialysate for the day. Peritoneal dialysis may be performed by you at home or at almost any other location. It is done every day. You may need up to five exchanges a day. The amount of time the dialysate is in your body between exchanges is called a dwell. The dwell depends on the number of exchanges needed and the characteristics of the peritoneum. It usually varies from 1.5-3 hours. You may go about your day normally between exchanges. Alternately, the exchanges may be done at night while you sleep, using a machine called a cycler. Which type of   dialysis should I choose? Both hemodialysis and peritoneal dialysis have advantages and disadvantages. Talk to your health care provider about which type of dialysis would be best for you. Your lifestyle and preferences should be considered along with your medical condition. In some cases, only one type of dialysis may be an option. Advantages of hemodialysis  It is done less often than peritoneal dialysis.  Someone else can do the  dialysis for you.  If you go to a dialysis center, your health care provider will be able to recognize any problems right away.  If you go to a dialysis center, you can interact with others who are having dialysis. This can provide you with emotional support.  Disadvantages of hemodialysis  Hemodialysis may cause cramps and low blood pressure. It may leave you feeling tired on the days you have the treatment.  If you go to a dialysis center, you will need to make weekly appointments and work around the center's schedule.  You will need to take extra care when traveling. If you go to a dialysis center, you will need to make special arrangements to visit a dialysis center near your destination. If you are having treatments at home, you will need to take the dialyzer with you to your destination.  You will need to avoid more foods than you would need to avoid on peritoneal dialysis.  Advantages of peritoneal dialysis  It is less likely than hemodialysis to cause cramps and low blood pressure.  You may do exchanges on your own wherever you are, including when you travel.  You do not need to avoid as many foods as you do on hemodialysis.  Disadvantages of peritoneal dialysis  It is done more often than hemodialysis.  Performing peritoneal dialysis requires you to have dexterity of the hands. You must also be able to lift bags.  You will have to learn sterilization techniques. You will need to practice them every day to reduce the risk of infection.  What changes will I need to make to my diet during dialysis? Both hemodialysis and peritoneal dialysis require you to make some changes to your diet. For example, you will need to limit your intake of foods high in the minerals phosphorus and potassium. You will also need to limit your fluid intake. Your dietitian can help you plan meals. A good meal plan can improve your dialysis and your health. What should I expect when beginning  dialysis? Adjusting to the dialysis treatment, schedule, and diet can take some time. You may need to stop working and may not be able to do some of the things you normally do. You may feel anxious or depressed when beginning dialysis. Eventually, many people feel better overall because of dialysis. Some people are able to return to work after making some changes, such as reducing work intensity. Where can I find more information?  National Kidney Foundation: www.kidney.org  American Association of Kidney Patients: www.aakp.org  American Kidney Fund: www.kidneyfund.org This information is not intended to replace advice given to you by your health care provider. Make sure you discuss any questions you have with your health care provider. Document Released: 02/24/2003 Document Revised: 05/11/2016 Document Reviewed: 01/28/2013 Elsevier Interactive Patient Education  2017 Elsevier Inc.  

## 2018-07-23 NOTE — Procedures (Signed)
Patient doing well on HD this am.  Will be dc'd today and will start her OP HD on Thursday at [facility].  Patient says to ask her daughter for details about her medical situation as she does not remember things.   I was present at this dialysis session, have reviewed the session itself and made  appropriate changes Kelly Splinter MD [place] pager [phone]   07/23/2018, 11:49 AM

## 2018-07-23 NOTE — Consult Note (Addendum)
   [facility] CM Inpatient Consult   07/23/2018  HANEEFAH VENTURINI 1946/02/13 [id number]    Spoke with patient at bedside to remind her of [place] Management follow up post hospital discharge. Initial [facility] hospital liaison bedside visit was on 07/17/18 when written consents were obtained. Mrs. Bochicchio states her HD is going to be Tuesdays, Thursdays, and Saturdays.  Will make referral to [facility].    Marthenia Rolling, MSN-Ed, RN,BSN [facility] Liaison [phone]

## 2018-07-23 NOTE — Progress Notes (Signed)
PT Cancellation Note  Patient Details Name: Sarah Harmon MRN: [id number] DOB: 09/24/1946   Cancelled Treatment:    Reason Eval/Treat Not Completed: Patient at procedure or test/unavailable. Pt in HD all AM. Will check back as time allows.  Benjiman Core, PTA Pager [phone] Acute Rehab   Allena Katz 07/23/2018, 1:13 PM

## 2018-07-24 ENCOUNTER — Telehealth: Payer: Self-pay | Admitting: *Deleted

## 2018-07-24 NOTE — Telephone Encounter (Signed)
Transition Care Management Follow-up Telephone Call   Date discharged? 07/23/18   How have you been since you were released from the hospital? Call pt spoke w/daughter (laverne) she states mom is doing alright   Do you understand why you were in the hospital? YES   Do you understand the discharge instructions? YES   Where were you discharged to? Home   Items Reviewed:  Medications reviewed: YES  Allergies reviewed: YES  Dietary changes reviewed: YES, heart healthy  Referrals reviewed: No referral needd   Functional Questionnaire:   Activities of Daily Living (ADLs):   She states she are independent in the following: bathing and hygiene, feeding, continence, grooming, toileting and dressing States they require assistance with the following: ambulation   Any transportation issues/concerns?: NO   Any patient concerns? NO   Confirmed importance and date/time of follow-up visits scheduled YES, appt 07/31/18  Provider Appointment booked with Dr. Jenny Reichmann   Confirmed with patient if condition begins to worsen call PCP or go to the ER.  Patient was given the office number and encouraged to call back with question or concerns.  : YES

## 2018-07-25 ENCOUNTER — Other Ambulatory Visit (HOSPITAL_COMMUNITY): Payer: Self-pay

## 2018-07-25 DIAGNOSIS — N2581 Secondary hyperparathyroidism of renal origin: Secondary | ICD-10-CM | POA: Diagnosis not present

## 2018-07-25 DIAGNOSIS — N186 End stage renal disease: Secondary | ICD-10-CM | POA: Diagnosis not present

## 2018-07-25 DIAGNOSIS — D631 Anemia in chronic kidney disease: Secondary | ICD-10-CM | POA: Diagnosis not present

## 2018-07-25 DIAGNOSIS — Z992 Dependence on renal dialysis: Secondary | ICD-10-CM | POA: Diagnosis not present

## 2018-07-26 ENCOUNTER — Ambulatory Visit
Admission: RE | Admit: 2018-07-26 | Discharge: 2018-07-26 | Disposition: A | Discharge status: Home / Self Care | Payer: Medicare Other | Source: Ambulatory Visit | Attending: Internal Medicine | Admitting: Internal Medicine

## 2018-07-26 ENCOUNTER — Ambulatory Visit (HOSPITAL_COMMUNITY): Admit: 2018-07-26 | Payer: Medicare Other

## 2018-07-26 ENCOUNTER — Other Ambulatory Visit: Payer: Self-pay | Admitting: *Deleted

## 2018-07-26 DIAGNOSIS — Z1231 Encounter for screening mammogram for malignant neoplasm of breast: Secondary | ICD-10-CM

## 2018-07-26 NOTE — Patient Outreach (Signed)
[facility]) Care Management  07/26/2018  DIERDRA SALAMEH [birth year] [id number]   Referral received from hospital liaison as member was recently admitted to hospital 7/27-8/6 with renal failure.  Per chart, she also has history of hypertension, alcoholic liver failure, and hyperlipidemia.    Call placed to member, identity verified.  This care manager introduced self and purpose of call, [facility] care management services explained.  She agrees to services but ask this care manager to contact her daughter [name].  Call then placed to daughter [name], no answer.  HIPAA compliant voice message left.  Will await call back.    Update @ 1200:  Call received back from daughter, [name].  Member's identity verified.  This care manager introduced self, [facility] care management services explained.  She is open to involvement.  Report she is doing "ok", has follow up appointment with primary MD next week.  Noted that member has dialysis Tuesday, Thursday, Saturdays.  Also noted, and daughter made aware, that MD appointment also scheduled for Tuesday next week.  She will call to reschedule.  Denies any urgent concerns at this time, agrees to home visit within the next 2 weeks.  Will perform assessment and develop individualized care plan at that time.  Valente David, [place], MSN [place] [phone]

## 2018-07-27 DIAGNOSIS — N2581 Secondary hyperparathyroidism of renal origin: Secondary | ICD-10-CM | POA: Diagnosis not present

## 2018-07-27 DIAGNOSIS — N186 End stage renal disease: Secondary | ICD-10-CM | POA: Diagnosis not present

## 2018-07-27 DIAGNOSIS — Z992 Dependence on renal dialysis: Secondary | ICD-10-CM | POA: Diagnosis not present

## 2018-07-27 DIAGNOSIS — D631 Anemia in chronic kidney disease: Secondary | ICD-10-CM | POA: Diagnosis not present

## 2018-07-29 ENCOUNTER — Other Ambulatory Visit: Payer: Self-pay | Admitting: Internal Medicine

## 2018-07-29 DIAGNOSIS — R928 Other abnormal and inconclusive findings on diagnostic imaging of breast: Secondary | ICD-10-CM

## 2018-07-30 ENCOUNTER — Other Ambulatory Visit: Payer: Self-pay | Admitting: Internal Medicine

## 2018-07-30 ENCOUNTER — Inpatient Hospital Stay: Payer: Medicare Other | Admitting: Internal Medicine

## 2018-07-30 DIAGNOSIS — N2581 Secondary hyperparathyroidism of renal origin: Secondary | ICD-10-CM | POA: Diagnosis not present

## 2018-07-30 DIAGNOSIS — Z992 Dependence on renal dialysis: Secondary | ICD-10-CM | POA: Diagnosis not present

## 2018-07-30 DIAGNOSIS — N186 End stage renal disease: Secondary | ICD-10-CM | POA: Diagnosis not present

## 2018-07-30 DIAGNOSIS — D631 Anemia in chronic kidney disease: Secondary | ICD-10-CM | POA: Diagnosis not present

## 2018-08-01 DIAGNOSIS — Z992 Dependence on renal dialysis: Secondary | ICD-10-CM | POA: Diagnosis not present

## 2018-08-01 DIAGNOSIS — N186 End stage renal disease: Secondary | ICD-10-CM | POA: Diagnosis not present

## 2018-08-01 DIAGNOSIS — N2581 Secondary hyperparathyroidism of renal origin: Secondary | ICD-10-CM | POA: Diagnosis not present

## 2018-08-01 DIAGNOSIS — D631 Anemia in chronic kidney disease: Secondary | ICD-10-CM | POA: Diagnosis not present

## 2018-08-02 ENCOUNTER — Other Ambulatory Visit: Payer: Self-pay | Admitting: Internal Medicine

## 2018-08-02 ENCOUNTER — Ambulatory Visit (HOSPITAL_COMMUNITY): Payer: Medicare Other

## 2018-08-02 ENCOUNTER — Ambulatory Visit
Admission: RE | Admit: 2018-08-02 | Discharge: 2018-08-02 | Disposition: A | Discharge status: Home / Self Care | Payer: Medicare Other | Source: Ambulatory Visit | Attending: Internal Medicine | Admitting: Internal Medicine

## 2018-08-02 DIAGNOSIS — R928 Other abnormal and inconclusive findings on diagnostic imaging of breast: Secondary | ICD-10-CM | POA: Diagnosis not present

## 2018-08-02 DIAGNOSIS — N6489 Other specified disorders of breast: Secondary | ICD-10-CM

## 2018-08-02 DIAGNOSIS — N6320 Unspecified lump in the left breast, unspecified quadrant: Secondary | ICD-10-CM | POA: Diagnosis not present

## 2018-08-03 DIAGNOSIS — D631 Anemia in chronic kidney disease: Secondary | ICD-10-CM | POA: Diagnosis not present

## 2018-08-03 DIAGNOSIS — N2581 Secondary hyperparathyroidism of renal origin: Secondary | ICD-10-CM | POA: Diagnosis not present

## 2018-08-03 DIAGNOSIS — N186 End stage renal disease: Secondary | ICD-10-CM | POA: Diagnosis not present

## 2018-08-03 DIAGNOSIS — Z992 Dependence on renal dialysis: Secondary | ICD-10-CM | POA: Diagnosis not present

## 2018-08-05 ENCOUNTER — Other Ambulatory Visit: Payer: Self-pay | Admitting: *Deleted

## 2018-08-05 ENCOUNTER — Encounter: Payer: Self-pay | Admitting: *Deleted

## 2018-08-05 NOTE — Patient Outreach (Signed)
[facility]) Care Management   08/05/2018  Sarah Harmon 1946-01-30 [id number]  Sarah Harmon is an 71 y.o. female  Subjective:   Member alert and oriented x3, denies shortness of breath or chest discomfort. Does complain of left upper extremity pain and swelling.  State arm has been swollen over the past 3-4 weeks.  Denies fever, arm warm to touch, but not hot.  Report compliance with dialysis and daily medications.  Objective:   Review of Systems  Constitutional: Negative.   HENT: Negative.   Eyes: Negative.   Respiratory: Negative.   Cardiovascular: Negative.   Gastrointestinal: Negative.   Genitourinary: Negative.   Musculoskeletal: Negative.   Skin: Negative.   Neurological: Negative.   Endo/Heme/Allergies: Negative.   Psychiatric/Behavioral: Negative.     Physical Exam  Constitutional: She is oriented to person, place, and time. She appears well-developed and well-nourished.  Neck: Normal range of motion.  Cardiovascular: Normal rate, regular rhythm and normal heart sounds.  Respiratory: Effort normal and breath sounds normal.  GI: Soft. Bowel sounds are normal.  Musculoskeletal: Normal range of motion.  Neurological: She is alert and oriented to person, place, and time.  Skin: Skin is warm and dry.   BP (!) 150/64 (BP Location: Right Arm, Patient Position: Sitting, Cuff Size: Normal)   Pulse 67   Resp 18   Ht 1.6 m ('5\' 3"'$ )   Wt 119 lb (54 kg)   SpO2 98%   BMI 21.08 kg/m   Encounter Medications:   Outpatient Encounter Medications as of 08/05/2018  Medication Sig Note  . ALPRAZolam (XANAX) 0.5 MG tablet TAKE 1 TABLET BY MOUTH ONCE DAILY AT BEDTIME   . amLODipine (NORVASC) 10 MG tablet TAKE 1 TABLET BY MOUTH ONCE DAILY   . Cholecalciferol (VITAMIN D3) 1000 UNITS CAPS Take 1,000 Units by mouth daily.    . cloNIDine (CATAPRES) 0.1 MG tablet Take 1 tablet (0.1 mg total) by mouth 3 (three) times daily.   . Darbepoetin Alfa (ARANESP) 100  MCG/0.5ML SOSY injection Inject 0.5 mLs (100 mcg total) into the vein every Thursday with hemodialysis.   . folic acid (FOLVITE) 1 MG tablet TAKE 1 TABLET BY MOUTH ONCE DAILY   . hydrALAZINE (APRESOLINE) 100 MG tablet TAKE 1 TABLET BY MOUTH THREE TIMES DAILY 08/05/2018: Taking 50 mg 3 times a day  . ketotifen (ZADITOR) 0.025 % ophthalmic solution Place 1 drop into both eyes 2 (two) times daily.   Marland Kitchen lactulose (CHRONULAC) 10 GM/15ML solution Take 30 mLs (20 g total) by mouth daily.   . propranolol (INDERAL) 40 MG tablet Take 1 tablet (40 mg total) by mouth 2 (two) times daily.   Marland Kitchen Propylene Glycol (SYSTANE BALANCE) 0.6 % SOLN Place 1 drop into both eyes daily.    No facility-administered encounter medications on file as of 08/05/2018.     Functional Status:   In your present state of health, do you have any difficulty performing the following activities: 08/05/2018 07/14/2018  Hearing? N N  Vision? N N  Difficulty concentrating or making decisions? N N  Walking or climbing stairs? N N  Dressing or bathing? N N  Doing errands, shopping? Y [place] and eating ? N -  Using the Toilet? N -  In the past six months, have you accidently leaked urine? N -  Do you have problems with loss of bowel control? N -  Managing your Medications? N -  Managing your Finances? N -  Housekeeping or managing your Housekeeping? Y -  Some recent data might be hidden    Fall/Depression Screening:    Fall Risk  08/05/2018 12/26/2017 02/28/2017  Falls in the past year? No No No   PHQ 2/9 Scores 08/05/2018 12/26/2017 09/04/2017 02/28/2017 02/28/2017 03/01/2016 08/31/2015  PHQ - 2 Score 0 0 '3 1 1 '$ 0 0  PHQ- 9 Score - - 4 - - - -    Assessment:    Met with member at scheduled time.  Assessment complete, no apparent distress causing emergent intervention noted.  Left arm assessed, good bruit and thrill.  She does report graft site feels hard and dialysis team has been having a more difficult time  accessing it.  Noted during call to vascular office that they have already talked to daughter regarding this concern. Member has appointment with primary MD this week.  Medications reviewed in the home, state she fill her own pill box.  Med planner examined and correct.  Member denies any urgent concerns.  Provided with this care manager's contact information as well as 24 hour nurse triage line, advised to contact with questions.   Plan:   Will follow up with member within the next month.  THN CM Care Plan Problem One     Most Recent Value  Care Plan Problem One  Knowledge deficit regarding kidney failure as evidenced by new treatment (dialysis)  Role Documenting the Problem One  Care Management [place] for Problem One  Active  THN Long Term Goal   Member/family will be able to verbalize the purpose of dialysis treatments within the next 31 days  THN Long Term Goal Start Date  08/05/18  Interventions for Problem One Long Term Goal  Member/family educated on process of dialysis treatments and complications of noncompliance.  THN CM Short Term Goal #1   Member will keep and attend follow up appointment with primary MD within the next week  [facility] CM Short Term Goal #1 Start Date  08/05/18  Interventions for Short Term Goal #1  Appointment reviewed with member/daughter.  Confirmed member will have transportation to MD appointment, advised on importance of attending in effort to decrease risk for readmission  THN CM Short Term Goal #2   Member/daughter will be able to verbalize decrease in pain of left upper extremity swelling over the next 2 weeks  THN CM Short Term Goal #2 Start Date  08/05/18  Interventions for Short Term Goal #2  Call placed to vascular office to report swelling/pain.  Will follow up with daughter regarding advice received from MD office.     Valente David, [place], MSN [place] [phone]

## 2018-08-06 DIAGNOSIS — D631 Anemia in chronic kidney disease: Secondary | ICD-10-CM | POA: Diagnosis not present

## 2018-08-06 DIAGNOSIS — Z992 Dependence on renal dialysis: Secondary | ICD-10-CM | POA: Diagnosis not present

## 2018-08-06 DIAGNOSIS — N2581 Secondary hyperparathyroidism of renal origin: Secondary | ICD-10-CM | POA: Diagnosis not present

## 2018-08-06 DIAGNOSIS — N186 End stage renal disease: Secondary | ICD-10-CM | POA: Diagnosis not present

## 2018-08-07 ENCOUNTER — Ambulatory Visit (INDEPENDENT_AMBULATORY_CARE_PROVIDER_SITE_OTHER): Payer: Medicare Other | Admitting: Internal Medicine

## 2018-08-07 ENCOUNTER — Ambulatory Visit (HOSPITAL_COMMUNITY)
Admission: RE | Admit: 2018-08-07 | Discharge: 2018-08-07 | Disposition: A | Discharge status: Home / Self Care | Payer: Medicare Other | Source: Ambulatory Visit | Attending: Internal Medicine | Admitting: Internal Medicine

## 2018-08-07 ENCOUNTER — Encounter: Payer: Self-pay | Admitting: Internal Medicine

## 2018-08-07 VITALS — BP 134/76 | HR 71 | Temp 98.7°F | Ht 63.0 in | Wt 112.0 lb

## 2018-08-07 DIAGNOSIS — M7989 Other specified soft tissue disorders: Secondary | ICD-10-CM

## 2018-08-07 DIAGNOSIS — I10 Essential (primary) hypertension: Secondary | ICD-10-CM

## 2018-08-07 DIAGNOSIS — N186 End stage renal disease: Secondary | ICD-10-CM

## 2018-08-07 NOTE — Patient Instructions (Signed)
Please continue all other medications as before, and refills have been done if requested.  Please have the pharmacy call with any other refills you may need.  Please continue your efforts at being more active, low cholesterol diet, and weight control.  Please keep your appointments with your specialists as you may have planned  You will be contacted regarding the referral for: left arm and leg ultrasound to make sure no blood clot  You will be contacted by phone if any changes need to be made immediately.  Otherwise, you will receive a letter about your results with an explanation, but please check with MyChart first.  Please remember to sign up for MyChart if you have not done so, as this will be important to you in the future with finding out test results, communicating by private email, and scheduling acute appointments online when needed.  Please return in 3 months, or sooner if needed

## 2018-08-07 NOTE — Assessment & Plan Note (Signed)
To continue HD as planned

## 2018-08-07 NOTE — Assessment & Plan Note (Signed)
Unusual presentation starting same time as LUE and left breast swellling, for LLE venous doppler - r/o DVT

## 2018-08-07 NOTE — Assessment & Plan Note (Addendum)
Rather dramatic unsusual presentation, neurovasc ok but with large diffuse swelling post AV fistula use with new start recently with HD; ok for LUE venous doppler, f/u vascular surgury as planned  Note:  Total time for pt hx, exam, review of record with pt in the room, determination of diagnoses and plan for further eval and tx is > 40 min, with over 50% spent in coordination and counseling of patient including the differential dx, tx, further evaluation and other management of LUE swelling and LLE swelling, left breast swelling, HTN, ESRD

## 2018-08-07 NOTE — Assessment & Plan Note (Signed)
stable overall by history and exam, recent data reviewed with pt, and pt to continue medical treatment as before,  to f/u any worsening symptoms or concerns BP Readings from Last 3 Encounters:  08/07/18 134/76  08/05/18 (!) 150/64  07/23/18 (!) 113/56

## 2018-08-07 NOTE — Progress Notes (Signed)
*  Preliminary Results* Left lower extremity venous duplex completed. Left lower extremity is negative for deep vein thrombosis. There is no evidence of left Baker's cyst.   Left upper extremity venous duplex completed. Left upper extremity is negative for deep and superficial vein thrombosis.  Left results with Dr. Gwynn Burly nurse.   08/07/2018 1:52 PM  Ajee Heasley Dawna Part

## 2018-08-07 NOTE — Progress Notes (Signed)
Subjective:    Patient ID: Sarah Harmon, female    DOB: 1946/06/13, 72 y.o.   MRN: [id number]  HPI  Here to f/u with family with c/o unsual LUE swelling new "ever since started HD" now ongoing tue/thur/sat.  Also with large swelling breast as well as LLE swelling, all with all over mild discomfort, but no fever, skin erythema and Pt denies chest pain, increased sob or doe, wheezing, orthopnea, PND, increased RLE swelling, palpitations, dizziness or syncope.  Tolerating HD well. No hx of DVT.   Pt denies fever, wt loss, night sweats, loss of appetite, or other constitutional symptoms  Pt denies new neurological symptoms such as new headache, or facial or extremity weakness or numbness   Pt denies polydipsia, polyuria.  Denies dependent type edema from lying on left side.  Has vascular surgury f/u incidentally scheduled in 2 days Past Medical History:  Diagnosis Date  . ALCOHOL ABUSE 07/13/2010  . ANXIETY 12/22/2009  . Cancer [facility])    liver cancer  . DEPRESSION 12/22/2009  . 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 DJD (degenerative joint disease), cervical    patient denies on preop of 05/10/15   . FATIGUE 12/23/2010  . GALLSTONES 12/22/2009  . Gallstones   . Headache    occasional   . HYPERLIPIDEMIA 12/22/2009  . HYPERTENSION 12/22/2009  . HYPONATREMIA 12/22/2009  . 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 OSTEOPENIA 12/22/2009  . Pancytopenia 07/13/2010  . RENAL CYST, LEFT 12/22/2009  . VITAMIN D DEFICIENCY 12/23/2010  . WEIGHT LOSS 12/23/2010   Past Surgical History:  Procedure Laterality Date  . ABDOMINAL HYSTERECTOMY    . AV FISTULA PLACEMENT Left 05/21/2018   Procedure: INSERTION OF ARTERIOVENOUS (AV) GORE-TEX GRAFT UPPER ARM;  Surgeon: Elam Dutch, MD;  Location: [place] [facility];  Service: Vascular;  Laterality: Left;  . AV FISTULA PLACEMENT Left 06/03/2018   Procedure: INSERTION OF ARTERIOVENOUS (AV) GORE-TEX 34mm x 10cm GRAFT INTO LEFT ARM;  Surgeon: Elam Dutch, MD;  Location: [place];   Service: Vascular;  Laterality: Left;  . IR GENERIC HISTORICAL  10/18/2016   IR RADIOLOGIST EVAL & MGMT 10/18/2016 [name], MD [facility] INTERV RAD  . IR RADIOLOGIST EVAL & MGMT  05/30/2017  . surgery for liver ca    . THROMBECTOMY AND REVISION OF ARTERIOVENTOUS (AV) GORETEX  GRAFT Left 06/03/2018   Procedure: THROMBECTOMY AND REVISION OF ARTERIOVENTOUS (AV) GORETEX  GRAFT ARM;  Surgeon: Elam Dutch, MD;  Location: [place];  Service: Vascular;  Laterality: Left;    reports that she has never smoked. She has never used smokeless tobacco. She reports that she drinks about 3.0 - 4.0 standard drinks of alcohol per week. She reports that she does not use drugs. family history includes Breast cancer in her other and sister; Gastric cancer in her sister; Lung cancer in her brother and sister. No Known Allergies Current Outpatient Medications on File Prior to Visit  Medication Sig Dispense Refill  . ALPRAZolam (XANAX) 0.5 MG tablet TAKE 1 TABLET BY MOUTH ONCE DAILY AT BEDTIME 30 tablet 2  . amLODipine (NORVASC) 10 MG tablet TAKE 1 TABLET BY MOUTH ONCE DAILY 90 tablet 2  . Cholecalciferol (VITAMIN D3) 1000 UNITS CAPS Take 1,000 Units by mouth daily.     . cloNIDine (CATAPRES) 0.1 MG tablet Take 1 tablet (0.1 mg total) by mouth 3 (three) times daily. 90 tablet 0  . Darbepoetin Alfa (ARANESP) 100 MCG/0.5ML SOSY injection Inject 0.5 mLs (100 mcg total)  into the vein every Thursday with hemodialysis. 4.2 mL   . folic acid (FOLVITE) 1 MG tablet TAKE 1 TABLET BY MOUTH ONCE DAILY 90 tablet 1  . hydrALAZINE (APRESOLINE) 100 MG tablet TAKE 1 TABLET BY MOUTH THREE TIMES DAILY 270 tablet 1  . ketotifen (ZADITOR) 0.025 % ophthalmic solution Place 1 drop into both eyes 2 (two) times daily.    Marland Kitchen lactulose (CHRONULAC) 10 GM/15ML solution Take 30 mLs (20 g total) by mouth daily. 473 mL 3  . propranolol (INDERAL) 40 MG tablet Take 1 tablet (40 mg total) by mouth 2 (two) times daily. 180 tablet 3  . Propylene Glycol  (SYSTANE BALANCE) 0.6 % SOLN Place 1 drop into both eyes daily.     No current facility-administered medications on file prior to visit.    Review of Systems  Constitutional: Negative for other unusual diaphoresis or sweats HENT: Negative for ear discharge or swelling Eyes: Negative for other worsening visual disturbances Respiratory: Negative for stridor or other swelling  Gastrointestinal: Negative for worsening distension or other blood Genitourinary: Negative for retention or other urinary change Musculoskeletal: Negative for other MSK pain or swelling Skin: Negative for color change or other new lesions Neurological: Negative for worsening tremors and other numbness  Psychiatric/Behavioral: Negative for worsening agitation or other fatigue All other system neg per pt    Objective:   Physical Exam BP 134/76   Pulse 71   Temp 98.7 F (37.1 C) (Oral)   Ht 5\' 3"  (1.6 m)   Wt 112 lb (50.8 kg)   SpO2 96%   BMI 19.84 kg/m  VS noted,  Constitutional: Pt appears in NAD HENT: Head: NCAT.  Right Ear: External ear normal.  Left Ear: External ear normal.  Eyes: . Pupils are equal, round, and reactive to light. Conjunctivae and EOM are normal Nose: without d/c or deformity Neck: Neck supple. Gross normal ROM Cardiovascular: Normal rate and regular rhythm.   Pulmonary/Chest: Effort normal and breath sounds without rales or wheezing.  LUE with 2+ diffuse swelling whole limb without erythema , rash or ulcer Left breast with 2+ enlarged over baseline compared to right LLE with 1+ diffuse leg swelling only No RUE, RLE or right breast involvement Abd:  Soft, NT, ND, + BS, no organomegaly Neurological: Pt is alert. At baseline orientation, motor grossly intact Skin: Skin is warm. No rashes, other new lesions, no LE edema Psychiatric: Pt behavior is normal without agitation  No other exam findings  Lab Results  Component Value Date   WBC 8.5 07/23/2018   HGB 9.5 (L) 07/23/2018    HCT 28.7 (L) 07/23/2018   PLT 60 (L) 07/23/2018   GLUCOSE 108 (H) 07/23/2018   CHOL 243 (H) 09/04/2017   TRIG 233.0 (H) 09/04/2017   HDL 128.30 09/04/2017   LDLDIRECT 85.0 09/04/2017   LDLCALC 77 02/28/2017   ALT 11 07/20/2018   AST 33 07/20/2018   NA 131 (L) 07/23/2018   K 3.1 (L) 07/23/2018   CL 93 (L) 07/23/2018   CREATININE 3.06 (H) 07/23/2018   BUN 34 (H) 07/23/2018   CO2 27 07/23/2018   TSH 1.123 07/14/2018   INR 0.98 07/14/2018   HGBA1C 4.7 05/17/2018         Assessment & Plan:  [id number]

## 2018-08-08 ENCOUNTER — Other Ambulatory Visit: Payer: Medicare Other

## 2018-08-08 ENCOUNTER — Ambulatory Visit (HOSPITAL_COMMUNITY)
Admit: 2018-08-08 | Discharge: 2018-08-08 | Disposition: A | Discharge status: Home / Self Care | Payer: Medicare Other | Attending: Interventional Radiology | Admitting: Interventional Radiology

## 2018-08-08 DIAGNOSIS — C229 Malignant neoplasm of liver, not specified as primary or secondary: Secondary | ICD-10-CM | POA: Diagnosis not present

## 2018-08-08 DIAGNOSIS — R609 Edema, unspecified: Secondary | ICD-10-CM | POA: Diagnosis not present

## 2018-08-08 DIAGNOSIS — K5730 Diverticulosis of large intestine without perforation or abscess without bleeding: Secondary | ICD-10-CM | POA: Insufficient documentation

## 2018-08-08 DIAGNOSIS — K7460 Unspecified cirrhosis of liver: Secondary | ICD-10-CM | POA: Diagnosis not present

## 2018-08-08 DIAGNOSIS — M4186 Other forms of scoliosis, lumbar region: Secondary | ICD-10-CM | POA: Diagnosis not present

## 2018-08-08 DIAGNOSIS — Z992 Dependence on renal dialysis: Secondary | ICD-10-CM | POA: Diagnosis not present

## 2018-08-08 DIAGNOSIS — K828 Other specified diseases of gallbladder: Secondary | ICD-10-CM | POA: Diagnosis not present

## 2018-08-08 DIAGNOSIS — N289 Disorder of kidney and ureter, unspecified: Secondary | ICD-10-CM | POA: Insufficient documentation

## 2018-08-08 DIAGNOSIS — N186 End stage renal disease: Secondary | ICD-10-CM | POA: Diagnosis not present

## 2018-08-08 DIAGNOSIS — I700 Atherosclerosis of aorta: Secondary | ICD-10-CM | POA: Diagnosis not present

## 2018-08-08 DIAGNOSIS — K8020 Calculus of gallbladder without cholecystitis without obstruction: Secondary | ICD-10-CM | POA: Insufficient documentation

## 2018-08-08 DIAGNOSIS — D631 Anemia in chronic kidney disease: Secondary | ICD-10-CM | POA: Diagnosis not present

## 2018-08-08 DIAGNOSIS — C220 Liver cell carcinoma: Secondary | ICD-10-CM | POA: Diagnosis not present

## 2018-08-08 DIAGNOSIS — N2581 Secondary hyperparathyroidism of renal origin: Secondary | ICD-10-CM | POA: Diagnosis not present

## 2018-08-09 ENCOUNTER — Telehealth: Payer: Self-pay | Admitting: Emergency Medicine

## 2018-08-09 DIAGNOSIS — N186 End stage renal disease: Secondary | ICD-10-CM | POA: Diagnosis not present

## 2018-08-09 DIAGNOSIS — Z992 Dependence on renal dialysis: Secondary | ICD-10-CM | POA: Diagnosis not present

## 2018-08-09 DIAGNOSIS — I639 Cerebral infarction, unspecified: Secondary | ICD-10-CM

## 2018-08-09 DIAGNOSIS — I871 Compression of vein: Secondary | ICD-10-CM | POA: Diagnosis not present

## 2018-08-09 DIAGNOSIS — M79602 Pain in left arm: Secondary | ICD-10-CM | POA: Diagnosis not present

## 2018-08-09 NOTE — Telephone Encounter (Signed)
Pt seen last on 08/07/18. Please advise

## 2018-08-09 NOTE — Telephone Encounter (Signed)
Spoke with pts daughter. Faxed order to her.

## 2018-08-09 NOTE — Telephone Encounter (Signed)
Copied from [place] [phone]. Topic: General - Other >> Aug 09, 2018  8:26 AM Sarah Harmon R wrote: Pts daughter called in wanting to know if a walker can be ordered for her mom with the seat on it .  Cb# [id number]

## 2018-08-09 NOTE — Telephone Encounter (Signed)
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

## 2018-08-10 DIAGNOSIS — N2581 Secondary hyperparathyroidism of renal origin: Secondary | ICD-10-CM | POA: Diagnosis not present

## 2018-08-10 DIAGNOSIS — D631 Anemia in chronic kidney disease: Secondary | ICD-10-CM | POA: Diagnosis not present

## 2018-08-10 DIAGNOSIS — Z992 Dependence on renal dialysis: Secondary | ICD-10-CM | POA: Diagnosis not present

## 2018-08-10 DIAGNOSIS — N186 End stage renal disease: Secondary | ICD-10-CM | POA: Diagnosis not present

## 2018-08-13 DIAGNOSIS — D631 Anemia in chronic kidney disease: Secondary | ICD-10-CM | POA: Diagnosis not present

## 2018-08-13 DIAGNOSIS — Z992 Dependence on renal dialysis: Secondary | ICD-10-CM | POA: Diagnosis not present

## 2018-08-13 DIAGNOSIS — N186 End stage renal disease: Secondary | ICD-10-CM | POA: Diagnosis not present

## 2018-08-13 DIAGNOSIS — N2581 Secondary hyperparathyroidism of renal origin: Secondary | ICD-10-CM | POA: Diagnosis not present

## 2018-08-15 DIAGNOSIS — N186 End stage renal disease: Secondary | ICD-10-CM | POA: Diagnosis not present

## 2018-08-15 DIAGNOSIS — Z992 Dependence on renal dialysis: Secondary | ICD-10-CM | POA: Diagnosis not present

## 2018-08-15 DIAGNOSIS — N2581 Secondary hyperparathyroidism of renal origin: Secondary | ICD-10-CM | POA: Diagnosis not present

## 2018-08-15 DIAGNOSIS — D631 Anemia in chronic kidney disease: Secondary | ICD-10-CM | POA: Diagnosis not present

## 2018-08-17 DIAGNOSIS — N186 End stage renal disease: Secondary | ICD-10-CM | POA: Diagnosis not present

## 2018-08-17 DIAGNOSIS — D631 Anemia in chronic kidney disease: Secondary | ICD-10-CM | POA: Diagnosis not present

## 2018-08-17 DIAGNOSIS — Z992 Dependence on renal dialysis: Secondary | ICD-10-CM | POA: Diagnosis not present

## 2018-08-17 DIAGNOSIS — N2581 Secondary hyperparathyroidism of renal origin: Secondary | ICD-10-CM | POA: Diagnosis not present

## 2018-08-18 DIAGNOSIS — N186 End stage renal disease: Secondary | ICD-10-CM | POA: Diagnosis not present

## 2018-08-18 DIAGNOSIS — I129 Hypertensive chronic kidney disease with stage 1 through stage 4 chronic kidney disease, or unspecified chronic kidney disease: Secondary | ICD-10-CM | POA: Diagnosis not present

## 2018-08-18 DIAGNOSIS — Z992 Dependence on renal dialysis: Secondary | ICD-10-CM | POA: Diagnosis not present

## 2018-08-20 DIAGNOSIS — Z992 Dependence on renal dialysis: Secondary | ICD-10-CM | POA: Diagnosis not present

## 2018-08-20 DIAGNOSIS — N2581 Secondary hyperparathyroidism of renal origin: Secondary | ICD-10-CM | POA: Diagnosis not present

## 2018-08-20 DIAGNOSIS — D631 Anemia in chronic kidney disease: Secondary | ICD-10-CM | POA: Diagnosis not present

## 2018-08-20 DIAGNOSIS — N186 End stage renal disease: Secondary | ICD-10-CM | POA: Diagnosis not present

## 2018-08-22 DIAGNOSIS — N2581 Secondary hyperparathyroidism of renal origin: Secondary | ICD-10-CM | POA: Diagnosis not present

## 2018-08-22 DIAGNOSIS — Z992 Dependence on renal dialysis: Secondary | ICD-10-CM | POA: Diagnosis not present

## 2018-08-22 DIAGNOSIS — N186 End stage renal disease: Secondary | ICD-10-CM | POA: Diagnosis not present

## 2018-08-22 DIAGNOSIS — D631 Anemia in chronic kidney disease: Secondary | ICD-10-CM | POA: Diagnosis not present

## 2018-08-23 ENCOUNTER — Other Ambulatory Visit: Payer: Self-pay | Admitting: *Deleted

## 2018-08-23 NOTE — Patient Outreach (Addendum)
[facility]) Care Management  08/23/2018  Sarah Harmon May 22, 1946 [id number]   Call placed to member's daughter to follow up on current health status.  No answer, HIPAA compliant voice message left.  Will await call back, if no call back will follow up within the next 4 business days.    Update:  Call received back from daughter [name], state member is doing "ok."  Report she continue to have swelling in her left arm, denies pain.  Was seen by the vascular specialist and had doppler studies performed.  Negative for blood clot, had procedure to open access more.  She state member's medication has been changed due to decreased blood pressure with dialysis, but report she has adjusted well.  Denies any urgent concerns.  Advised to contact this care manager with questions.  Home visit scheduled for within the next 2 weeks.   Valente David, [place], MSN [place] [phone]

## 2018-08-24 DIAGNOSIS — N186 End stage renal disease: Secondary | ICD-10-CM | POA: Diagnosis not present

## 2018-08-24 DIAGNOSIS — D631 Anemia in chronic kidney disease: Secondary | ICD-10-CM | POA: Diagnosis not present

## 2018-08-24 DIAGNOSIS — Z992 Dependence on renal dialysis: Secondary | ICD-10-CM | POA: Diagnosis not present

## 2018-08-24 DIAGNOSIS — N2581 Secondary hyperparathyroidism of renal origin: Secondary | ICD-10-CM | POA: Diagnosis not present

## 2018-08-27 DIAGNOSIS — Z992 Dependence on renal dialysis: Secondary | ICD-10-CM | POA: Diagnosis not present

## 2018-08-27 DIAGNOSIS — N2581 Secondary hyperparathyroidism of renal origin: Secondary | ICD-10-CM | POA: Diagnosis not present

## 2018-08-27 DIAGNOSIS — D631 Anemia in chronic kidney disease: Secondary | ICD-10-CM | POA: Diagnosis not present

## 2018-08-27 DIAGNOSIS — N186 End stage renal disease: Secondary | ICD-10-CM | POA: Diagnosis not present

## 2018-08-28 ENCOUNTER — Encounter: Payer: Self-pay | Admitting: *Deleted

## 2018-08-28 ENCOUNTER — Ambulatory Visit
Admission: RE | Admit: 2018-08-28 | Discharge: 2018-08-28 | Disposition: A | Discharge status: Home / Self Care | Payer: Medicare Other | Source: Ambulatory Visit | Attending: Interventional Radiology | Admitting: Interventional Radiology

## 2018-08-28 DIAGNOSIS — N189 Chronic kidney disease, unspecified: Secondary | ICD-10-CM | POA: Diagnosis not present

## 2018-08-28 DIAGNOSIS — Z8505 Personal history of malignant neoplasm of liver: Secondary | ICD-10-CM | POA: Diagnosis not present

## 2018-08-28 DIAGNOSIS — Z9889 Other specified postprocedural states: Secondary | ICD-10-CM | POA: Diagnosis not present

## 2018-08-28 DIAGNOSIS — C220 Liver cell carcinoma: Secondary | ICD-10-CM

## 2018-08-28 HISTORY — PX: IR RADIOLOGIST EVAL & MGMT: IMG5224

## 2018-08-28 NOTE — Progress Notes (Signed)
Chief Complaint: Status post thermal ablation of a right lobe hepatocellular carcinoma on 05/14/2015.  History of Present Illness: Sarah Harmon is a 73 y.o. female now just over 3 years status post percutaneous thermal ablation of a subcapsular segment VIII hepatocellular carcinoma.  Since her last visit, she has been hospitalized several times and underwent left arm hemodialysis graft placement by Dr. Oneida Alar in June of this year for progressive renal failure.  She started hemodialysis last month.  She has had some recent weight loss and is on multiple supplemental shakes for additional nutrition.  She has had to start using a walker to assist with walking.  She has had weakness.  She denies any abdominal pain.   Past Medical History:  Diagnosis Date  . ALCOHOL ABUSE 07/13/2010  . ANXIETY 12/22/2009  . Cancer [facility])    liver cancer  . DEPRESSION 12/22/2009  . 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 DJD (degenerative joint disease), cervical    patient denies on preop of 05/10/15   . FATIGUE 12/23/2010  . GALLSTONES 12/22/2009  . Gallstones   . Headache    occasional   . HYPERLIPIDEMIA 12/22/2009  . HYPERTENSION 12/22/2009  . HYPONATREMIA 12/22/2009  . 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 OSTEOPENIA 12/22/2009  . Pancytopenia 07/13/2010  . RENAL CYST, LEFT 12/22/2009  . VITAMIN D DEFICIENCY 12/23/2010  . WEIGHT LOSS 12/23/2010    Past Surgical History:  Procedure Laterality Date  . ABDOMINAL HYSTERECTOMY    . AV FISTULA PLACEMENT Left 05/21/2018   Procedure: INSERTION OF ARTERIOVENOUS (AV) GORE-TEX GRAFT UPPER ARM;  Surgeon: Elam Dutch, MD;  Location: [place] OR;  Service: Vascular;  Laterality: Left;  . AV FISTULA PLACEMENT Left 06/03/2018   Procedure: INSERTION OF ARTERIOVENOUS (AV) GORE-TEX 22m x 10cm GRAFT INTO LEFT ARM;  Surgeon: FElam Dutch MD;  [facility] [facility]  Service: Vascular;  Laterality: Left;  . IR GENERIC HISTORICAL  10/18/2016   IR RADIOLOGIST EVAL & MGMT 10/18/2016  GAletta Edouard MD [facility] INTERV RAD  . IR RADIOLOGIST EVAL & MGMT  05/30/2017  . surgery for liver ca    . THROMBECTOMY AND REVISION OF ARTERIOVENTOUS (AV) GORETEX  GRAFT Left 06/03/2018   Procedure: THROMBECTOMY AND REVISION OF ARTERIOVENTOUS (AV) GORETEX  GRAFT ARM;  Surgeon: FElam Dutch MD;  [facility]  Service: Vascular;  Laterality: Left;    Allergies: Patient has no known allergies.  Medications: Prior to Admission medications   Medication Sig Start Date End Date Taking? Authorizing Provider  ALPRAZolam (Duanne Moron 0.5 MG tablet TAKE 1 TABLET BY MOUTH ONCE DAILY AT BEDTIME 06/25/18  Yes JBiagio Borg MD  amLODipine (NORVASC) 10 MG tablet TAKE 1 TABLET BY MOUTH ONCE DAILY 03/11/18  Yes JBiagio Borg MD  Cholecalciferol (VITAMIN D3) 1000 UNITS CAPS Take 1,000 Units by mouth daily.    Yes [provider]  cloNIDine (CATAPRES) 0.1 MG tablet Take 1 tablet (0.1 mg total) by mouth 3 (three) times daily. 07/23/18  Yes MCherene Altes MD  Darbepoetin Alfa (ARANESP) 100 MCG/0.5ML SOSY injection Inject 0.5 mLs (100 mcg total) into the vein every Thursday with hemodialysis. 07/25/18  Yes MCherene Altes MD  folic acid (FOLVITE) 1 MG tablet TAKE 1 TABLET BY MOUTH ONCE DAILY 07/31/18  Yes JBiagio Borg MD  hydrALAZINE (APRESOLINE) 100 MG tablet TAKE 1 TABLET BY MOUTH THREE TIMES DAILY 07/31/18  Yes JBiagio Borg MD  ketotifen (ZADITOR) 0.025 % ophthalmic solution Place  1 drop into both eyes 2 (two) times daily.   Yes [provider]  lactulose (CHRONULAC) 10 GM/15ML solution Take 30 mLs (20 g total) by mouth daily. 09/17/17  Yes Biagio Borg, MD  propranolol (INDERAL) 40 MG tablet Take 1 tablet (40 mg total) by mouth 2 (two) times daily. 09/04/17  Yes Biagio Borg, MD  Propylene Glycol (SYSTANE BALANCE) 0.6 % SOLN Place 1 drop into both eyes daily.   Yes [provider]     Family History  Problem Relation Age of Onset  . Lung cancer Brother   . Breast  cancer Other   . Breast cancer Sister   . Gastric cancer Sister   . Lung cancer Sister   . Colon cancer Neg Hx   . Colon polyps Neg Hx   . Rectal cancer Neg Hx   . Stomach cancer Neg Hx   . Esophageal cancer Neg Hx     Social History   Socioeconomic History  . Marital status: Widowed    Spouse name: Not on file  . Number of children: 4  . Years of education: Not on file  . Highest education level: Not on file  Occupational History  . Occupation: retired  Scientific laboratory technician  . Financial resource strain: Not on file  . Food insecurity:    Worry: Not on file    Inability: Not on file  . Transportation needs:    Medical: Not on file    Non-medical: Not on file  Tobacco Use  . Smoking status: Never Smoker  . Smokeless tobacco: Never Used  Substance and Sexual Activity  . Alcohol use: Yes    Alcohol/week: 3.0 - 4.0 standard drinks    Types: 3 - 4 Cans of beer per week    Comment: she quit 02/2015   . Drug use: No  . Sexual activity: Not on file  Lifestyle  . Physical activity:    Days per week: Not on file    Minutes per session: Not on file  . Stress: Not on file  Relationships  . Social connections:    Talks on phone: Not on file    Gets together: Not on file    Attends religious service: Not on file    Active member of club or organization: Not on file    Attends meetings of clubs or organizations: Not on file    Relationship status: Not on file  Other Topics Concern  . Not on file  Social History Narrative   Widowed, grown son lives with her   Retired from Manufacturing engineer   Has total of #5 children   Enjoys watching TV and planting flowers    ECOG Status: 1 - Symptomatic but completely ambulatory  Review of Systems: A 12 point ROS discussed and pertinent positives are indicated in the HPI above.  All other systems are negative.  Review of Systems  Constitutional: Positive for activity change and unexpected weight change.  Respiratory: Negative.     Cardiovascular: Negative.   Gastrointestinal: Negative.   Genitourinary: Negative.   Musculoskeletal: Negative.   Neurological: Negative.     Vital Signs: BP (!) 144/65   Pulse 66   Temp 98.5 F (36.9 C) (Oral)   Resp 15   Ht _0  (1.6 m)   Wt 49.9 kg   SpO2 100%   BMI 19.49 kg/m   Physical Exam  Constitutional: She is oriented to person, place, and time. No distress.  Abdominal:  Soft. She exhibits no distension. There is no tenderness. There is no rebound and no guarding.  Neurological: She is alert and oriented to person, place, and time.  Skin: She is not diaphoretic.  Vitals reviewed.   Imaging: Mr Abdomen Wo Contrast  Result Date: 08/08/2018 CLINICAL DATA:  Hepatocellular carcinoma. Renal lesions. Dialysis patient. EXAM: MRI ABDOMEN WITHOUT CONTRAST TECHNIQUE: Multiplanar multisequence MR imaging was performed without the administration of intravenous contrast. COMPARISON:  10/31/2017 FINDINGS: Lower chest: Small right and trace left pleural effusion. Mild cardiomegaly. There is notable asymmetric edema in the left breast and possibly in the left arm Hepatobiliary: Peripheral somewhat triangular faintly accentuated T2 signal at the site of prior ablation measuring about 1.8 by 1.1 cm, with slight retraction of the liver capsule overlying the site, as shown on image 20/3. This is stable in size to the 10/31/2017 exam. The original lesion in this location had a more rounded and targetoid appearance, and measured about 1.4 by 1.6 cm on 02/18/2015. Overall the appearance is considered essentially stable. We do not have IV contrast to provide further specificity. The original lesion caused moderate restriction of diffusion, currently I do not observe significant diffusion restriction. Hepatic contour and morphology is compatible with cirrhosis. Reduced activity in the liver on inphase images favors hemochromatosis. 11 mm filling defect in the neck of the gallbladder on image 30/7  favoring gallstone. Sludge in the gallbladder. New 7 mm filling defect in the gallbladder on image 33/3 medially is probably from tumefactive sludge or an adherent gallstone, less likely to be a new gallbladder polyp, surveillance suggested. Pancreas:  Unremarkable Spleen:  Unremarkable Adrenals/Urinary Tract:  Adrenal glands unremarkable. Septated approximately 4.2 by 3.2 cm cystic lesion of the left kidney upper pole medially without observed nodularity, similar to 02/28/2015, compatible with Bosniak category 73F lesion. T1 hyperintense 7 by 3 mm lesion along the left mid kidney on image 59/9, likely a complex/hemorrhagic cyst. Stomach/Bowel: Descending colon diverticulosis. Vascular/Lymphatic:  Aortoiliac atherosclerotic vascular disease. Other: Diffuse mesenteric edema. Subcutaneous edema along the patient's right lower back. Musculoskeletal: Mild levoconvex lumbar scoliosis. Degenerative endplate findings at T5-5 and L5-S1. IMPRESSION: 1. Stable 1.8 by 1.1 cm triangular peripheral focus of T2 hyperintensity at the site of prior ablation, with slight retraction of the overlying liver capsule. This likely represents fibrosis related to the ablation site. I do not see a more targetoid, rounded, or progressive appearance to suggest local recurrence, although clearly the lack of IV contrast reduces sensitivity. 2. Hepatic cirrhosis with dropout of signal on inphase images favoring hemochromatosis. 3. 11 mm gallstone in the neck of the gallbladder. 4. Sludge in the gallbladder with a new 7 mm filling defect along the nondependent gallbladder wall. This is probably from tumefactive sludge or tear gallstone, and less likely to be a new gallbladder polyp. Surveillance suggested. 5. Stable Bosniak category 73F lesion of the left kidney upper pole medially. We have now documented 3 years of stability. At least annual follow up is recommended for 2 more years. There is also a very small suspected hemorrhagic cyst of the left  mid kidney. 6. Asymmetric edema in the left breast and left upper arm. Mastitis is not excluded. 7. Other imaging findings of potential clinical significance: Descending colon diverticulosis. Aortic Atherosclerosis (ICD10-I70.0). Diffuse mesenteric edema. Subcutaneous edema along the lower back. Levoconvex lumbar scoliosis. Electronically Signed   By: Van Clines M.D.   On: 08/08/2018 09:49   US Breast Ltd Uni Left Inc Axilla  Result Date: 08/02/2018 CLINICAL  DATA:  72 year old female recalled from screening mammogram dated 07/26/2018 for left breast skin and trabecular thickening. The patient was recently admitted to the hospital for fluid overload, at which time she was started on dialysis for end-stage renal disease with a left upper extremity AV fistula. She states she began having diffuse left-sided swelling after the fistula was placed. She has a history of alcoholic cirrhosis and is status post resection of hepatocellular carcinoma. EXAM: DIGITAL DIAGNOSTIC LEFT MAMMOGRAM WITH CAD AND TOMO ULTRASOUND LEFT BREAST COMPARISON:  Previous exam(s). ACR Breast Density Category b: There are scattered areas of fibroglandular density. FINDINGS: Diffuse skin and trabecular thickening persists in the patient's left breast. No definite mammographic masses or suspicious calcifications are identified. Mammographic images were processed with CAD. On physical exam, there is diffuse left-sided edema involving the patient's left arm, left leg and left chest wall/breast. Targeted ultrasound is performed, showing diffuse edema and skin thickening involving all 4 quadrants of the left breast. No suspicious masses or other sonographic abnormalities are identified. IMPRESSION: Probably benign edema involving the left breast and the entire left side of the patient's body status post recent hospital admission for fluid overload. She was started on dialysis through a left upper extremity AV fistula at that time.  Recommendation is for clinical follow-up for the patient's fluid status with repeat imaging in 6-8 weeks to ensure resolution of left breast edema/skin thickening. RECOMMENDATION: Diagnostic left breast mammogram and ultrasound in 6-8 weeks. I have discussed the findings and recommendations with the patient. Results were also provided in writing at the conclusion of the visit. If applicable, a reminder letter will be sent to the patient regarding the next appointment. BI-RADS CATEGORY  3: Probably benign. Electronically Signed   By: Kristopher Oppenheim M.D.   On: 08/02/2018 16:03   Mm Diag Breast Tomo Uni Left  Result Date: 08/02/2018 CLINICAL DATA:  72 year old female recalled from screening mammogram dated 07/26/2018 for left breast skin and trabecular thickening. The patient was recently admitted to the hospital for fluid overload, at which time she was started on dialysis for end-stage renal disease with a left upper extremity AV fistula. She states she began having diffuse left-sided swelling after the fistula was placed. She has a history of alcoholic cirrhosis and is status post resection of hepatocellular carcinoma. EXAM: DIGITAL DIAGNOSTIC LEFT MAMMOGRAM WITH CAD AND TOMO ULTRASOUND LEFT BREAST COMPARISON:  Previous exam(s). ACR Breast Density Category b: There are scattered areas of fibroglandular density. FINDINGS: Diffuse skin and trabecular thickening persists in the patient's left breast. No definite mammographic masses or suspicious calcifications are identified. Mammographic images were processed with CAD. On physical exam, there is diffuse left-sided edema involving the patient's left arm, left leg and left chest wall/breast. Targeted ultrasound is performed, showing diffuse edema and skin thickening involving all 4 quadrants of the left breast. No suspicious masses or other sonographic abnormalities are identified. IMPRESSION: Probably benign edema involving the left breast and the entire left side  of the patient's body status post recent hospital admission for fluid overload. She was started on dialysis through a left upper extremity AV fistula at that time. Recommendation is for clinical follow-up for the patient's fluid status with repeat imaging in 6-8 weeks to ensure resolution of left breast edema/skin thickening. RECOMMENDATION: Diagnostic left breast mammogram and ultrasound in 6-8 weeks. I have discussed the findings and recommendations with the patient. Results were also provided in writing at the conclusion of the visit. If applicable, a reminder letter will  be sent to the patient regarding the next appointment. BI-RADS CATEGORY  3: Probably benign. Electronically Signed   By: Kristopher Oppenheim M.D.   On: 08/02/2018 16:03    Labs:  CBC: Recent Labs    07/20/18 0507 07/21/18 0354 07/22/18 0411 07/23/18 0635  WBC 3.0* 7.2 11.2* 8.5  HGB 9.2* 9.3* 9.6* 9.5*  HCT 27.1* 28.6* 28.8* 28.7*  PLT 70* 40* 80* 60*    COAGS: Recent Labs    09/09/17 2343 05/21/18 0639 06/03/18 0958 07/14/18 0124  INR 1.11 1.03 1.00 0.98  APTT 33  --   --  46*    BMP: Recent Labs    07/19/18 0415 07/20/18 0507 07/22/18 0658 07/23/18 0651  NA 131* 125* 127* 131*  K 3.5 3.6 4.6 3.1*  CL 98 92* 93* 93*  CO2 _0 GLUCOSE 98 138* 117* 108*  BUN 18 24* 38* 34*  CALCIUM 8.0* 8.5* 8.5* 8.3*  CREATININE 2.93* 3.39* 3.46* 3.06*  GFRNONAA 15* 12* 12* 14*  GFRAA 17* 14* 14* 16*    LIVER FUNCTION TESTS: Recent Labs    05/07/18 0309  05/21/18 0639 07/14/18 0124  07/19/18 0415 07/20/18 0507 07/22/18 0658 07/23/18 0651  BILITOT 0.8  --  1.0 0.9  --   --  0.9  --   --   AST 24  --  27 28  --   --  33  --   --   ALT 13*  --  19 10  --   --  11  --   --   ALKPHOS 38  --  49 46  --   --  45  --   --   PROT 7.4  --  8.1 7.2  --   --  6.0*  --   --   ALBUMIN 3.5   < > 3.8 2.9*   < > 2.3* 2.4* 2.4* 2.2*   < > = values in this interval not displayed.    Assessment and Plan:  I met  with Mrs. Cerro and her daughter.  We reviewed the recent MRI of the abdomen on 08/08/2018 that demonstrates a stable scar at the level of prior ablated hepatocellular carcinoma.  By unenhanced MRI, there are no signs of recurrent carcinoma or new lesions in the liver.  Left renal cysts have a stable appearance with a Bosniak 32F medial upper pole cyst showing stable appearance and a probable additional hemorrhagic small left mid renal cyst.  Continued unenhanced MRI follow-up was recommended.  I recommended another unenhanced MRI in 1 year.   Electronically SignedAletta Edouard T 08/28/2018, 8:37 AM     I spent a total of  15 Minutes in face to face in clinical consultation, greater than 50% of which was counseling/coordinating care post ablation of a right lobe hepatocellular carcinoma.

## 2018-08-29 DIAGNOSIS — D631 Anemia in chronic kidney disease: Secondary | ICD-10-CM | POA: Diagnosis not present

## 2018-08-29 DIAGNOSIS — N2581 Secondary hyperparathyroidism of renal origin: Secondary | ICD-10-CM | POA: Diagnosis not present

## 2018-08-29 DIAGNOSIS — N186 End stage renal disease: Secondary | ICD-10-CM | POA: Diagnosis not present

## 2018-08-29 DIAGNOSIS — Z992 Dependence on renal dialysis: Secondary | ICD-10-CM | POA: Diagnosis not present

## 2018-08-31 DIAGNOSIS — Z992 Dependence on renal dialysis: Secondary | ICD-10-CM | POA: Diagnosis not present

## 2018-08-31 DIAGNOSIS — N2581 Secondary hyperparathyroidism of renal origin: Secondary | ICD-10-CM | POA: Diagnosis not present

## 2018-08-31 DIAGNOSIS — D631 Anemia in chronic kidney disease: Secondary | ICD-10-CM | POA: Diagnosis not present

## 2018-08-31 DIAGNOSIS — N186 End stage renal disease: Secondary | ICD-10-CM | POA: Diagnosis not present

## 2018-09-02 ENCOUNTER — Other Ambulatory Visit: Payer: Self-pay | Admitting: *Deleted

## 2018-09-02 NOTE — Patient Outreach (Signed)
[facility]) Care Management   09/02/2018  Sarah Harmon 1946/02/15 [id number]  Sarah Harmon is an 72 y.o. female  Subjective:   Met with member at scheduled time.  She denies any pain or discomfort at this time.  Left arm swelling has decreased, denies pain at graft site.  Report compliance with dialysis treatments.  Objective:   Review of Systems  Constitutional: Negative.   HENT: Negative.   Eyes: Negative.   Respiratory: Negative.   Cardiovascular: Negative.   Gastrointestinal: Negative.   Genitourinary: Negative.   Musculoskeletal: Negative.   Skin: Negative.   Neurological: Negative.   Endo/Heme/Allergies: Negative.   Psychiatric/Behavioral: Negative.     Physical Exam  Constitutional: She is oriented to person, place, and time. She appears well-developed.  Neck: Normal range of motion.  Cardiovascular: Normal rate, regular rhythm and normal heart sounds.  Respiratory: Effort normal and breath sounds normal.  GI: Soft. Bowel sounds are normal.  Musculoskeletal: Normal range of motion.  Neurological: She is alert and oriented to person, place, and time.  Skin: Skin is warm and dry.   BP (!) 158/72 (BP Location: Right Arm, Patient Position: Sitting, Cuff Size: Normal)   Pulse 63   Resp 18   SpO2 98%   Encounter Medications:   Outpatient Encounter Medications as of 09/02/2018  Medication Sig Note  . ALPRAZolam (XANAX) 0.5 MG tablet TAKE 1 TABLET BY MOUTH ONCE DAILY AT BEDTIME   . amLODipine (NORVASC) 10 MG tablet TAKE 1 TABLET BY MOUTH ONCE DAILY   . Cholecalciferol (VITAMIN D3) 1000 UNITS CAPS Take 1,000 Units by mouth daily.    . cloNIDine (CATAPRES) 0.1 MG tablet Take 1 tablet (0.1 mg total) by mouth 3 (three) times daily.   . Darbepoetin Alfa (ARANESP) 100 MCG/0.5ML SOSY injection Inject 0.5 mLs (100 mcg total) into the vein every Thursday with hemodialysis.   . folic acid (FOLVITE) 1 MG tablet TAKE 1 TABLET BY MOUTH ONCE DAILY   .  ketotifen (ZADITOR) 0.025 % ophthalmic solution Place 1 drop into both eyes 2 (two) times daily.   Marland Kitchen lactulose (CHRONULAC) 10 GM/15ML solution Take 30 mLs (20 g total) by mouth daily.   . propranolol (INDERAL) 40 MG tablet Take 1 tablet (40 mg total) by mouth 2 (two) times daily.   Marland Kitchen Propylene Glycol (SYSTANE BALANCE) 0.6 % SOLN Place 1 drop into both eyes daily.   . hydrALAZINE (APRESOLINE) 100 MG tablet TAKE 1 TABLET BY MOUTH THREE TIMES DAILY 08/05/2018: Taking 50 mg 3 times a day   No facility-administered encounter medications on file as of 09/02/2018.     Functional Status:   In your present state of health, do you have any difficulty performing the following activities: 08/05/2018 07/14/2018  Hearing? N N  Vision? N N  Difficulty concentrating or making decisions? N N  Walking or climbing stairs? N N  Dressing or bathing? N N  Doing errands, shopping? Y [place] and eating ? N -  Using the Toilet? N -  In the past six months, have you accidently leaked urine? N -  Do you have problems with loss of bowel control? N -  Managing your Medications? N -  Managing your Finances? N -  Housekeeping or managing your Housekeeping? Y -  Some recent data might be hidden    Fall/Depression Screening:    Fall Risk  08/05/2018 12/26/2017 02/28/2017  Falls in the past year? No No  No   PHQ 2/9 Scores 08/05/2018 12/26/2017 09/04/2017 02/28/2017 02/28/2017 03/01/2016 08/31/2015  PHQ - 2 Score 0 0 _0 0 0  PHQ- 9 Score - - 4 - - - -    Assessment:    Met with member at scheduled time, son present during visit.  Message received from Georgie Chard, Renal Navigator with [employer], with concern regarding member's medication management (particularly her blood pressure medications).  There is also concern regarding member's diet, fluid restriction, and potential alcohol use.  Medications reviewed, she and son verbalize understanding.  Diet addressed, she receives Nepro  from dialysis center weekly, but she report it's not enough to last the whole week. State it is more expensive then Ensure therefore her family provide her with Ensure when she does not have Nepro.  She verbalizes understanding of fluid restrictions, denies the use of alcohol.  Report she has a good appetite, son agrees.  Both son and member denies any urgent concerns, advised to contact this care manager with questions.  Plan:   Will follow up with member within the next month.  THN CM Care Plan Problem One     Most Recent Value  Care Plan Problem One  Knowledge deficit regarding kidney failure as evidenced by new treatment (dialysis)  Role Documenting the Problem One  Care Management [place] for Problem One  Active  THN Long Term Goal   Member/family will be able to verbalize the purpose of dialysis treatments within the next 31 days  THN Long Term Goal Start Date  08/05/18  [facility] Long Term Goal Met Date  09/02/18  [facility] CM Short Term Goal #1   Member will keep and attend follow up appointment with primary MD within the next week  THN CM Short Term Goal #1 Start Date  08/05/18  [facility] CM Short Term Goal #1 Met Date  09/02/18  TH[facility] CM Short Term Goal #2   Member/daughter will be able to verbalize decrease in pain of left upper extremity swelling over the next 2 weeks  THN CM Short Term Goal #2 Start Date  08/05/18  [facility] CM Short Term Goal #2 Met Date  09/02/18    [facility] CM Care Plan Problem Two     Most Recent Value  Care Plan Problem Two  Knowledge deficit regarding management of ESRD  Role Documenting the Problem Two  Care Management Coordinator  Care Plan for Problem Two  Active  Interventions for Problem Two Long Term Goal   Member and son educated on proper renal diet, use of ensure/Nepro and fluid restrictions (50 oz daily),  Advised to keep log to monitor fluid intake  THN Long Term Goal  Member will report following diet plan over the next 31 days  THN Long Term Goal Start  Date  09/02/18  [facility] CM Short Term Goal #1   Member will report taking medications as prescribed over the next 4 weeks  THN CM Short Term Goal #1 Start Date  09/02/18  Interventions for Short Term Goal #2   Medications reviewd with member and son, pill box and bottles reviewed in the home, instructions provided per kidney MD requests  THN CM Short Term Goal #2   Member will report checking blood pressure on non dialysis days  THN CM Short Term Goal #2 Start Date  09/02/18  Interventions for Short Term Goal #2  Member and son educated on importance or monitoring blood pressure daily in effort to manage  hypertension     Valente David, Therapist, sports, MSN [place] Manager [phone]

## 2018-09-03 DIAGNOSIS — N2581 Secondary hyperparathyroidism of renal origin: Secondary | ICD-10-CM | POA: Diagnosis not present

## 2018-09-03 DIAGNOSIS — N186 End stage renal disease: Secondary | ICD-10-CM | POA: Diagnosis not present

## 2018-09-03 DIAGNOSIS — D631 Anemia in chronic kidney disease: Secondary | ICD-10-CM | POA: Diagnosis not present

## 2018-09-03 DIAGNOSIS — Z992 Dependence on renal dialysis: Secondary | ICD-10-CM | POA: Diagnosis not present

## 2018-09-05 DIAGNOSIS — N2581 Secondary hyperparathyroidism of renal origin: Secondary | ICD-10-CM | POA: Diagnosis not present

## 2018-09-05 DIAGNOSIS — Z992 Dependence on renal dialysis: Secondary | ICD-10-CM | POA: Diagnosis not present

## 2018-09-05 DIAGNOSIS — N186 End stage renal disease: Secondary | ICD-10-CM | POA: Diagnosis not present

## 2018-09-05 DIAGNOSIS — D631 Anemia in chronic kidney disease: Secondary | ICD-10-CM | POA: Diagnosis not present

## 2018-09-07 DIAGNOSIS — Z992 Dependence on renal dialysis: Secondary | ICD-10-CM | POA: Diagnosis not present

## 2018-09-07 DIAGNOSIS — N2581 Secondary hyperparathyroidism of renal origin: Secondary | ICD-10-CM | POA: Diagnosis not present

## 2018-09-07 DIAGNOSIS — N186 End stage renal disease: Secondary | ICD-10-CM | POA: Diagnosis not present

## 2018-09-07 DIAGNOSIS — D631 Anemia in chronic kidney disease: Secondary | ICD-10-CM | POA: Diagnosis not present

## 2018-09-10 DIAGNOSIS — Z992 Dependence on renal dialysis: Secondary | ICD-10-CM | POA: Diagnosis not present

## 2018-09-10 DIAGNOSIS — D631 Anemia in chronic kidney disease: Secondary | ICD-10-CM | POA: Diagnosis not present

## 2018-09-10 DIAGNOSIS — N2581 Secondary hyperparathyroidism of renal origin: Secondary | ICD-10-CM | POA: Diagnosis not present

## 2018-09-10 DIAGNOSIS — N186 End stage renal disease: Secondary | ICD-10-CM | POA: Diagnosis not present

## 2018-09-11 ENCOUNTER — Other Ambulatory Visit: Payer: Self-pay | Admitting: Internal Medicine

## 2018-09-12 DIAGNOSIS — N2581 Secondary hyperparathyroidism of renal origin: Secondary | ICD-10-CM | POA: Diagnosis not present

## 2018-09-12 DIAGNOSIS — D631 Anemia in chronic kidney disease: Secondary | ICD-10-CM | POA: Diagnosis not present

## 2018-09-12 DIAGNOSIS — N186 End stage renal disease: Secondary | ICD-10-CM | POA: Diagnosis not present

## 2018-09-12 DIAGNOSIS — Z992 Dependence on renal dialysis: Secondary | ICD-10-CM | POA: Diagnosis not present

## 2018-09-13 ENCOUNTER — Ambulatory Visit
Admission: RE | Admit: 2018-09-13 | Discharge: 2018-09-13 | Disposition: A | Discharge status: Home / Self Care | Payer: Medicare Other | Source: Ambulatory Visit | Attending: Internal Medicine | Admitting: Internal Medicine

## 2018-09-13 ENCOUNTER — Ambulatory Visit: Payer: Medicare Other

## 2018-09-13 DIAGNOSIS — N6489 Other specified disorders of breast: Secondary | ICD-10-CM | POA: Diagnosis not present

## 2018-09-14 DIAGNOSIS — D631 Anemia in chronic kidney disease: Secondary | ICD-10-CM | POA: Diagnosis not present

## 2018-09-14 DIAGNOSIS — Z992 Dependence on renal dialysis: Secondary | ICD-10-CM | POA: Diagnosis not present

## 2018-09-14 DIAGNOSIS — N186 End stage renal disease: Secondary | ICD-10-CM | POA: Diagnosis not present

## 2018-09-14 DIAGNOSIS — N2581 Secondary hyperparathyroidism of renal origin: Secondary | ICD-10-CM | POA: Diagnosis not present

## 2018-09-16 ENCOUNTER — Ambulatory Visit: Payer: Medicare Other

## 2018-09-16 ENCOUNTER — Encounter (HOSPITAL_COMMUNITY): Payer: Medicare Other

## 2018-09-17 DIAGNOSIS — I129 Hypertensive chronic kidney disease with stage 1 through stage 4 chronic kidney disease, or unspecified chronic kidney disease: Secondary | ICD-10-CM | POA: Diagnosis not present

## 2018-09-17 DIAGNOSIS — N2581 Secondary hyperparathyroidism of renal origin: Secondary | ICD-10-CM | POA: Diagnosis not present

## 2018-09-17 DIAGNOSIS — N186 End stage renal disease: Secondary | ICD-10-CM | POA: Diagnosis not present

## 2018-09-17 DIAGNOSIS — D631 Anemia in chronic kidney disease: Secondary | ICD-10-CM | POA: Diagnosis not present

## 2018-09-17 DIAGNOSIS — Z992 Dependence on renal dialysis: Secondary | ICD-10-CM | POA: Diagnosis not present

## 2018-09-19 DIAGNOSIS — D631 Anemia in chronic kidney disease: Secondary | ICD-10-CM | POA: Diagnosis not present

## 2018-09-19 DIAGNOSIS — N186 End stage renal disease: Secondary | ICD-10-CM | POA: Diagnosis not present

## 2018-09-19 DIAGNOSIS — N2581 Secondary hyperparathyroidism of renal origin: Secondary | ICD-10-CM | POA: Diagnosis not present

## 2018-09-19 DIAGNOSIS — Z992 Dependence on renal dialysis: Secondary | ICD-10-CM | POA: Diagnosis not present

## 2018-09-21 DIAGNOSIS — Z992 Dependence on renal dialysis: Secondary | ICD-10-CM | POA: Diagnosis not present

## 2018-09-21 DIAGNOSIS — N186 End stage renal disease: Secondary | ICD-10-CM | POA: Diagnosis not present

## 2018-09-21 DIAGNOSIS — D631 Anemia in chronic kidney disease: Secondary | ICD-10-CM | POA: Diagnosis not present

## 2018-09-21 DIAGNOSIS — N2581 Secondary hyperparathyroidism of renal origin: Secondary | ICD-10-CM | POA: Diagnosis not present

## 2018-09-24 DIAGNOSIS — Z992 Dependence on renal dialysis: Secondary | ICD-10-CM | POA: Diagnosis not present

## 2018-09-24 DIAGNOSIS — D631 Anemia in chronic kidney disease: Secondary | ICD-10-CM | POA: Diagnosis not present

## 2018-09-24 DIAGNOSIS — N186 End stage renal disease: Secondary | ICD-10-CM | POA: Diagnosis not present

## 2018-09-24 DIAGNOSIS — N2581 Secondary hyperparathyroidism of renal origin: Secondary | ICD-10-CM | POA: Diagnosis not present

## 2018-09-25 ENCOUNTER — Other Ambulatory Visit: Payer: Self-pay | Admitting: Internal Medicine

## 2018-09-25 NOTE — Telephone Encounter (Signed)
Done erx 

## 2018-09-26 DIAGNOSIS — N186 End stage renal disease: Secondary | ICD-10-CM | POA: Diagnosis not present

## 2018-09-26 DIAGNOSIS — N2581 Secondary hyperparathyroidism of renal origin: Secondary | ICD-10-CM | POA: Diagnosis not present

## 2018-09-26 DIAGNOSIS — Z992 Dependence on renal dialysis: Secondary | ICD-10-CM | POA: Diagnosis not present

## 2018-09-26 DIAGNOSIS — D631 Anemia in chronic kidney disease: Secondary | ICD-10-CM | POA: Diagnosis not present

## 2018-09-28 DIAGNOSIS — N186 End stage renal disease: Secondary | ICD-10-CM | POA: Diagnosis not present

## 2018-09-28 DIAGNOSIS — D631 Anemia in chronic kidney disease: Secondary | ICD-10-CM | POA: Diagnosis not present

## 2018-09-28 DIAGNOSIS — Z992 Dependence on renal dialysis: Secondary | ICD-10-CM | POA: Diagnosis not present

## 2018-09-28 DIAGNOSIS — N2581 Secondary hyperparathyroidism of renal origin: Secondary | ICD-10-CM | POA: Diagnosis not present

## 2018-09-30 ENCOUNTER — Other Ambulatory Visit: Payer: Self-pay | Admitting: *Deleted

## 2018-09-30 NOTE — Patient Outreach (Signed)
[facility] ([facility]) Care Management   09/30/2018  Sarah Harmon 04/30/1946 [id number]  Sarah Harmon is an 72 y.o. female  Subjective:   Member alert and oriented x3, denies any pain or discomfort at this time.  Report compliance with medications and dialysis treatments.  Objective:   Review of Systems  Constitutional: Negative.   HENT: Negative.   Eyes: Negative.   Respiratory: Negative.   Cardiovascular: Negative.   Gastrointestinal: Negative.   Genitourinary: Negative.   Musculoskeletal: Negative.   Skin: Negative.   Neurological: Negative.   Endo/Heme/Allergies: Negative.   Psychiatric/Behavioral: Negative.     Physical Exam  Constitutional: She is oriented to person, place, and time. She appears well-developed and well-nourished.  Neck: Normal range of motion.  Cardiovascular: Normal rate, regular rhythm and normal heart sounds.  Respiratory: Effort normal and breath sounds normal.  GI: Soft. Bowel sounds are normal.  Musculoskeletal: Normal range of motion.  Neurological: She is alert and oriented to person, place, and time.  Skin: Skin is warm and dry.   BP (!) 142/70   Pulse 64   Resp 18   SpO2 99%   Encounter Medications:   Outpatient Encounter Medications as of 09/30/2018  Medication Sig Note  . ALPRAZolam (XANAX) 0.5 MG tablet TAKE 1 TABLET BY MOUTH ONCE DAILY AT BEDTIME   . amLODipine (NORVASC) 10 MG tablet TAKE 1 TABLET BY MOUTH ONCE DAILY   . Cholecalciferol (VITAMIN D3) 1000 UNITS CAPS Take 1,000 Units by mouth daily.    . cloNIDine (CATAPRES) 0.1 MG tablet Take 1 tablet (0.1 mg total) by mouth 3 (three) times daily.   . CONSTULOSE 10 GM/15ML solution  TAKE 30 MLS BY MOUTH DAILY   . Darbepoetin Alfa (ARANESP) 100 MCG/0.5ML SOSY injection Inject 0.5 mLs (100 mcg total) into the vein every Thursday with hemodialysis.   . folic acid (FOLVITE) 1 MG tablet TAKE 1 TABLET BY MOUTH ONCE DAILY   . hydrALAZINE (APRESOLINE) 100 MG tablet  TAKE 1 TABLET BY MOUTH THREE TIMES DAILY 09/02/2018: Taking 50 mg 2 times a day  . ketotifen (ZADITOR) 0.025 % ophthalmic solution Place 1 drop into both eyes 2 (two) times daily.   . propranolol (INDERAL) 40 MG tablet TAKE 1 TABLET BY MOUTH TWICE DAILY   . Propylene Glycol (SYSTANE BALANCE) 0.6 % SOLN Place 1 drop into both eyes daily.    No facility-administered encounter medications on file as of 09/30/2018.     Functional Status:   In your present state of health, do you have any difficulty performing the following activities: 08/05/2018 07/14/2018  Hearing? N N  Vision? N N  Difficulty concentrating or making decisions? N N  Walking or climbing stairs? N N  Dressing or bathing? N N  Doing errands, shopping? Y N  Comment - -  Preparing Food and eating ? N -  Using the Toilet? N -  In the past six months, have you accidently leaked urine? N -  Do you have problems with loss of bowel control? N -  Managing your Medications? N -  Managing your Finances? N -  Housekeeping or managing your Housekeeping? Y -  Some recent data might be hidden    Fall/Depression Screening:    Fall Risk  08/05/2018 12/26/2017 02/28/2017  Falls in the past year? No No No   PHQ 2/9 Scores 08/05/2018 12/26/2017 09/04/2017 02/28/2017 02/28/2017 03/01/2016 08/31/2015  PHQ - 2 Score 0 0 3 1 1 0 0  PHQ- 9   Score - - 4 - - - -    Assessment:    Met with member at scheduled time.  Recently moved into new home due to concerns with cost of rent and condition of her other home.  Son still lives with her to provide support.  No swelling in her left arm, denies pain.  Report she continue to drink supplements when provided by dialysis center.  State she has been checking her blood pressure but unable to find her log (family packed up her home moved while she was in dialysis).  Denies any extreme high readings, pressure today stable.   Discussed ongoing needs with member.  She denies any further needs for community nursing,  agree to transition to health coach for ongoing management of hypertension.  Plan:   Will place referral to health coach. Will notify primary MD of transition to health coach/disease management.  [facility] CM Care Plan Problem Two     Most Recent Value  Care Plan Problem Two  Knowledge deficit regarding management of ESRD  Role Documenting the Problem Two  Care Management Coordinator  Care Plan for Problem Two  Not Active  [facility] Long Term Goal  Member will report following diet plan over the next 31 days  [facility] Long Term Goal Start Date  09/02/18  [facility] Long Term Goal Met Date  09/30/18  [facility] CM Short Term Goal #1   Member will report taking medications as prescribed over the next 4 weeks  [facility] CM Short Term Goal #1 Start Date  09/02/18  [facility] CM Short Term Goal #1 Met Date   09/30/18  [facility] CM Short Term Goal #2   Member will report checking blood pressure on non dialysis days  [facility] CM Short Term Goal #2 Start Date  09/02/18  [facility] CM Short Term Goal #2 Met Date  09/30/18     Valente David, RN, MSN [employer] [phone]

## 2018-10-01 DIAGNOSIS — N186 End stage renal disease: Secondary | ICD-10-CM | POA: Diagnosis not present

## 2018-10-01 DIAGNOSIS — Z992 Dependence on renal dialysis: Secondary | ICD-10-CM | POA: Diagnosis not present

## 2018-10-01 DIAGNOSIS — N2581 Secondary hyperparathyroidism of renal origin: Secondary | ICD-10-CM | POA: Diagnosis not present

## 2018-10-01 DIAGNOSIS — D631 Anemia in chronic kidney disease: Secondary | ICD-10-CM | POA: Diagnosis not present

## 2018-10-03 DIAGNOSIS — N186 End stage renal disease: Secondary | ICD-10-CM | POA: Diagnosis not present

## 2018-10-03 DIAGNOSIS — N2581 Secondary hyperparathyroidism of renal origin: Secondary | ICD-10-CM | POA: Diagnosis not present

## 2018-10-03 DIAGNOSIS — Z992 Dependence on renal dialysis: Secondary | ICD-10-CM | POA: Diagnosis not present

## 2018-10-03 DIAGNOSIS — D631 Anemia in chronic kidney disease: Secondary | ICD-10-CM | POA: Diagnosis not present

## 2018-10-04 ENCOUNTER — Encounter: Payer: Self-pay | Admitting: *Deleted

## 2018-10-05 DIAGNOSIS — N186 End stage renal disease: Secondary | ICD-10-CM | POA: Diagnosis not present

## 2018-10-05 DIAGNOSIS — N2581 Secondary hyperparathyroidism of renal origin: Secondary | ICD-10-CM | POA: Diagnosis not present

## 2018-10-05 DIAGNOSIS — D631 Anemia in chronic kidney disease: Secondary | ICD-10-CM | POA: Diagnosis not present

## 2018-10-05 DIAGNOSIS — Z992 Dependence on renal dialysis: Secondary | ICD-10-CM | POA: Diagnosis not present

## 2018-10-08 DIAGNOSIS — D631 Anemia in chronic kidney disease: Secondary | ICD-10-CM | POA: Diagnosis not present

## 2018-10-08 DIAGNOSIS — Z992 Dependence on renal dialysis: Secondary | ICD-10-CM | POA: Diagnosis not present

## 2018-10-08 DIAGNOSIS — N2581 Secondary hyperparathyroidism of renal origin: Secondary | ICD-10-CM | POA: Diagnosis not present

## 2018-10-08 DIAGNOSIS — N186 End stage renal disease: Secondary | ICD-10-CM | POA: Diagnosis not present

## 2018-10-10 DIAGNOSIS — D631 Anemia in chronic kidney disease: Secondary | ICD-10-CM | POA: Diagnosis not present

## 2018-10-10 DIAGNOSIS — N2581 Secondary hyperparathyroidism of renal origin: Secondary | ICD-10-CM | POA: Diagnosis not present

## 2018-10-10 DIAGNOSIS — N186 End stage renal disease: Secondary | ICD-10-CM | POA: Diagnosis not present

## 2018-10-10 DIAGNOSIS — Z992 Dependence on renal dialysis: Secondary | ICD-10-CM | POA: Diagnosis not present

## 2018-10-12 DIAGNOSIS — N186 End stage renal disease: Secondary | ICD-10-CM | POA: Diagnosis not present

## 2018-10-12 DIAGNOSIS — N2581 Secondary hyperparathyroidism of renal origin: Secondary | ICD-10-CM | POA: Diagnosis not present

## 2018-10-12 DIAGNOSIS — D631 Anemia in chronic kidney disease: Secondary | ICD-10-CM | POA: Diagnosis not present

## 2018-10-12 DIAGNOSIS — Z992 Dependence on renal dialysis: Secondary | ICD-10-CM | POA: Diagnosis not present

## 2018-10-15 DIAGNOSIS — D631 Anemia in chronic kidney disease: Secondary | ICD-10-CM | POA: Diagnosis not present

## 2018-10-15 DIAGNOSIS — Z992 Dependence on renal dialysis: Secondary | ICD-10-CM | POA: Diagnosis not present

## 2018-10-15 DIAGNOSIS — N186 End stage renal disease: Secondary | ICD-10-CM | POA: Diagnosis not present

## 2018-10-15 DIAGNOSIS — N2581 Secondary hyperparathyroidism of renal origin: Secondary | ICD-10-CM | POA: Diagnosis not present

## 2018-10-17 DIAGNOSIS — N2581 Secondary hyperparathyroidism of renal origin: Secondary | ICD-10-CM | POA: Diagnosis not present

## 2018-10-17 DIAGNOSIS — Z992 Dependence on renal dialysis: Secondary | ICD-10-CM | POA: Diagnosis not present

## 2018-10-17 DIAGNOSIS — D631 Anemia in chronic kidney disease: Secondary | ICD-10-CM | POA: Diagnosis not present

## 2018-10-17 DIAGNOSIS — N186 End stage renal disease: Secondary | ICD-10-CM | POA: Diagnosis not present

## 2018-10-18 DIAGNOSIS — N186 End stage renal disease: Secondary | ICD-10-CM | POA: Diagnosis not present

## 2018-10-18 DIAGNOSIS — Z992 Dependence on renal dialysis: Secondary | ICD-10-CM | POA: Diagnosis not present

## 2018-10-18 DIAGNOSIS — I129 Hypertensive chronic kidney disease with stage 1 through stage 4 chronic kidney disease, or unspecified chronic kidney disease: Secondary | ICD-10-CM | POA: Diagnosis not present

## 2018-10-19 DIAGNOSIS — Z992 Dependence on renal dialysis: Secondary | ICD-10-CM | POA: Diagnosis not present

## 2018-10-19 DIAGNOSIS — D631 Anemia in chronic kidney disease: Secondary | ICD-10-CM | POA: Diagnosis not present

## 2018-10-19 DIAGNOSIS — N2581 Secondary hyperparathyroidism of renal origin: Secondary | ICD-10-CM | POA: Diagnosis not present

## 2018-10-19 DIAGNOSIS — N186 End stage renal disease: Secondary | ICD-10-CM | POA: Diagnosis not present

## 2018-10-21 ENCOUNTER — Other Ambulatory Visit: Payer: Self-pay

## 2018-10-21 DIAGNOSIS — N185 Chronic kidney disease, stage 5: Secondary | ICD-10-CM

## 2018-10-22 DIAGNOSIS — N2581 Secondary hyperparathyroidism of renal origin: Secondary | ICD-10-CM | POA: Diagnosis not present

## 2018-10-22 DIAGNOSIS — Z992 Dependence on renal dialysis: Secondary | ICD-10-CM | POA: Diagnosis not present

## 2018-10-22 DIAGNOSIS — D631 Anemia in chronic kidney disease: Secondary | ICD-10-CM | POA: Diagnosis not present

## 2018-10-22 DIAGNOSIS — N186 End stage renal disease: Secondary | ICD-10-CM | POA: Diagnosis not present

## 2018-10-23 ENCOUNTER — Ambulatory Visit (HOSPITAL_COMMUNITY)
Admission: RE | Admit: 2018-10-23 | Discharge: 2018-10-23 | Disposition: A | Discharge status: Home / Self Care | Payer: Medicare Other | Source: Ambulatory Visit | Attending: Nephrology | Admitting: Nephrology

## 2018-10-23 ENCOUNTER — Encounter: Payer: Self-pay | Admitting: *Deleted

## 2018-10-23 ENCOUNTER — Other Ambulatory Visit: Payer: Self-pay

## 2018-10-23 ENCOUNTER — Ambulatory Visit (INDEPENDENT_AMBULATORY_CARE_PROVIDER_SITE_OTHER): Payer: Medicare Other | Admitting: Physician Assistant

## 2018-10-23 ENCOUNTER — Other Ambulatory Visit: Payer: Self-pay | Admitting: *Deleted

## 2018-10-23 VITALS — BP 160/62 | HR 63 | Temp 98.8°F | Resp 16 | Ht 63.0 in | Wt 110.0 lb

## 2018-10-23 DIAGNOSIS — Z992 Dependence on renal dialysis: Secondary | ICD-10-CM | POA: Diagnosis not present

## 2018-10-23 DIAGNOSIS — N186 End stage renal disease: Secondary | ICD-10-CM

## 2018-10-23 DIAGNOSIS — N185 Chronic kidney disease, stage 5: Secondary | ICD-10-CM | POA: Insufficient documentation

## 2018-10-23 DIAGNOSIS — I639 Cerebral infarction, unspecified: Secondary | ICD-10-CM | POA: Diagnosis not present

## 2018-10-23 NOTE — Progress Notes (Signed)
History of Present Illness:  Patient is a 72 y.o. year old female who presents secondary to malfunctioning AV graft in the left UE.  05/21/2018 the graft was placed by Dr. Oneida Alar and then on 06/03/2018 the graft was treated with a thrombectomy.   She states she has had to stay on her HD machine longer than usual secondary to slow running of her left UE AV graft.    She states when she is on HD she has cramps in her left UE, but the cramps go away once HD is completed.  She denise pain, numbness and loss of motor in the left UE.        Past Medical History:  Diagnosis Date  . ALCOHOL ABUSE 07/13/2010  . ANXIETY 12/22/2009  . Cancer [facility])    liver cancer  . DEPRESSION 12/22/2009  . 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 DJD (degenerative joint disease), cervical    patient denies on preop of 05/10/15   . FATIGUE 12/23/2010  . GALLSTONES 12/22/2009  . Gallstones   . Headache    occasional   . HYPERLIPIDEMIA 12/22/2009  . HYPERTENSION 12/22/2009  . HYPONATREMIA 12/22/2009  . 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 OSTEOPENIA 12/22/2009  . Pancytopenia 07/13/2010  . RENAL CYST, LEFT 12/22/2009  . VITAMIN D DEFICIENCY 12/23/2010  . WEIGHT LOSS 12/23/2010    Past Surgical History:  Procedure Laterality Date  . ABDOMINAL HYSTERECTOMY    . AV FISTULA PLACEMENT Left 05/21/2018   Procedure: INSERTION OF ARTERIOVENOUS (AV) GORE-TEX GRAFT UPPER ARM;  Surgeon: Elam Dutch, MD;  Location: [place] [facility];  Service: Vascular;  Laterality: Left;  . AV FISTULA PLACEMENT Left 06/03/2018   Procedure: INSERTION OF ARTERIOVENOUS (AV) GORE-TEX 67mm x 10cm GRAFT INTO LEFT ARM;  Surgeon: Elam Dutch, MD;  Location: [place];  Service: Vascular;  Laterality: Left;  . IR GENERIC HISTORICAL  10/18/2016   IR RADIOLOGIST EVAL & MGMT 10/18/2016 [name], MD [facility] INTERV RAD  . IR RADIOLOGIST EVAL & MGMT  05/30/2017  . IR RADIOLOGIST EVAL & MGMT  08/28/2018  . surgery for liver ca    . THROMBECTOMY AND REVISION OF  ARTERIOVENTOUS (AV) GORETEX  GRAFT Left 06/03/2018   Procedure: THROMBECTOMY AND REVISION OF ARTERIOVENTOUS (AV) GORETEX  GRAFT ARM;  Surgeon: Elam Dutch, MD;  [facility];  Service: Vascular;  Laterality: Left;     Social History Social History   Tobacco Use  . Smoking status: Never Smoker  . Smokeless tobacco: Never Used  Substance Use Topics  . Alcohol use: Yes    Alcohol/week: 3.0 - 4.0 standard drinks    Types: 3 - 4 Cans of beer per week    Comment: she quit 02/2015   . Drug use: No    Family History Family History  Problem Relation Age of Onset  . Lung cancer Brother   . Breast cancer Other   . Breast cancer Sister   . Gastric cancer Sister   . Lung cancer Sister   . Colon cancer Neg Hx   . Colon polyps Neg Hx   . Rectal cancer Neg Hx   . Stomach cancer Neg Hx   . Esophageal cancer Neg Hx     Allergies  No Known Allergies   Current Outpatient Medications  Medication Sig Dispense Refill  . ALPRAZolam (XANAX) 0.5 MG tablet TAKE 1 TABLET BY MOUTH ONCE DAILY AT BEDTIME 30 tablet 2  . amLODipine (NORVASC) 10 MG  tablet TAKE 1 TABLET BY MOUTH ONCE DAILY 90 tablet 2  . B Complex-C-Zn-Folic Acid (DIALYVITE 742 WITH ZINC) 0.8 MG TABS Take 1 tablet by mouth daily.  12  . Cholecalciferol (VITAMIN D3) 1000 UNITS CAPS Take 1,000 Units by mouth daily.     . cloNIDine (CATAPRES) 0.1 MG tablet Take 1 tablet (0.1 mg total) by mouth 3 (three) times daily. 90 tablet 0  . CONSTULOSE 10 GM/15ML solution  TAKE 30 MLS BY MOUTH DAILY 473 mL 3  . Darbepoetin Alfa (ARANESP) 100 MCG/0.5ML SOSY injection Inject 0.5 mLs (100 mcg total) into the vein every Thursday with hemodialysis. 4.2 mL   . folic acid (FOLVITE) 1 MG tablet TAKE 1 TABLET BY MOUTH ONCE DAILY 90 tablet 1  . hydrALAZINE (APRESOLINE) 100 MG tablet TAKE 1 TABLET BY MOUTH THREE TIMES DAILY 270 tablet 1  . ketotifen (ZADITOR) 0.025 % ophthalmic solution Place 1 drop into both eyes 2 (two) times daily.    Marland Kitchen  lidocaine-prilocaine (EMLA) cream APPLY SMALL AMOUNT TO ACCESS SITE (AVF) 3 TIMES A WEEK 30 MINUTES BEFORE DIALYSIS. COVER WITH OCCLUSIVE DRESSING (SARAN WRAP)  12  . propranolol (INDERAL) 40 MG tablet TAKE 1 TABLET BY MOUTH TWICE DAILY 180 tablet 3  . Propylene Glycol (SYSTANE BALANCE) 0.6 % SOLN Place 1 drop into both eyes daily.     No current facility-administered medications for this visit.     ROS:   General:  No weight loss, Fever, chills  HEENT: No recent headaches, no nasal bleeding, no visual changes, no sore throat  Neurologic: No dizziness, blackouts, seizures. No recent symptoms of stroke or mini- stroke. No recent episodes of slurred speech, or temporary blindness.  Cardiac: No recent episodes of chest pain/pressure, no shortness of breath at rest.  No shortness of breath with exertion.  Denies history of atrial fibrillation or irregular heartbeat  Vascular: No history of rest pain in feet.  No history of claudication.  No history of non-healing ulcer, No history of DVT   Pulmonary: No home oxygen, no productive cough, no hemoptysis,  No asthma or wheezing  Musculoskeletal:  [ ]  Arthritis, [ ]  Low back pain,  [ ]  Joint pain  Hematologic:No history of hypercoagulable state.  No history of easy bleeding.  No history of anemia  Gastrointestinal: No hematochezia or melena,  No gastroesophageal reflux, no trouble swallowing  Urinary: [ ]  chronic Kidney disease, [x ] on HD - [ ]  MWF or [x ] TTHS, [ ]  Burning with urination, [ ]  Frequent urination, [ ]  Difficulty urinating;   Skin: No rashes  Psychological: No history of anxiety,  No history of depression   Physical Examination  Vitals:   10/23/18 1309  BP: (!) 160/62  Pulse: 63  Resp: 16  Temp: 98.8 F (37.1 C)  TempSrc: Oral  SpO2: 99%  Weight: 110 lb (49.9 kg)  Height: 5\' 3"  (1.6 m)    Body mass index is 19.49 kg/m.  General:  Alert and oriented, no acute distress HEENT: Normal, normocephalic Neck: No  bruit or JVD Pulmonary: Clear to auscultation bilaterally Cardiac: Regular Rate and Rhythm without murmur Gastrointestinal: Soft, non-tender, non-distended, no mass, no scars Skin: No rash Extremity Pulses:   radial, brachial pulses bilaterally, palpable thrill throughout the AV graft.   Musculoskeletal: No deformity or edema  Neurologic: Upper and lower extremity motor 5/5 and symmetric  DATA:  Duplex of the left AV graft Stenosis at arterial anastomosis with flow rate 892 cm/sno frank stenosis  noted.   ASSESSMENT:  ESRD left AV graft access malfunction   PLAN: I will schedule her for shuntogram of the left AV graft with possible intervention.  This is her first access site and has a thrombectomy followed by unknown procedure at [facility].  If we can not keep this graft working well she may need to look for new access.  I explained this to her today and she states she understands.    Roxy Horseman PA-C [facility] [place] Office: [phone]

## 2018-10-23 NOTE — H&P (View-Only) (Signed)
History of Present Illness:  Patient is a 72 y.o. year old female who presents secondary to malfunctioning AV graft in the left UE.  05/21/2018 the graft was placed by Dr. Oneida Alar and then on 06/03/2018 the graft was treated with a thrombectomy.   She states she has had to stay on her HD machine longer than usual secondary to slow running of her left UE AV graft.    She states when she is on HD she has cramps in her left UE, but the cramps go away once HD is completed.  She denise pain, numbness and loss of motor in the left UE.        Past Medical History:  Diagnosis Date  . ALCOHOL ABUSE 07/13/2010  . ANXIETY 12/22/2009  . Cancer [facility])    liver cancer  . DEPRESSION 12/22/2009  . 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 DJD (degenerative joint disease), cervical    patient denies on preop of 05/10/15   . FATIGUE 12/23/2010  . GALLSTONES 12/22/2009  . Gallstones   . Headache    occasional   . HYPERLIPIDEMIA 12/22/2009  . HYPERTENSION 12/22/2009  . HYPONATREMIA 12/22/2009  . 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 OSTEOPENIA 12/22/2009  . Pancytopenia 07/13/2010  . RENAL CYST, LEFT 12/22/2009  . VITAMIN D DEFICIENCY 12/23/2010  . WEIGHT LOSS 12/23/2010    Past Surgical History:  Procedure Laterality Date  . ABDOMINAL HYSTERECTOMY    . AV FISTULA PLACEMENT Left 05/21/2018   Procedure: INSERTION OF ARTERIOVENOUS (AV) GORE-TEX GRAFT UPPER ARM;  Surgeon: Elam Dutch, MD;  Location: [place] [facility];  Service: Vascular;  Laterality: Left;  . AV FISTULA PLACEMENT Left 06/03/2018   Procedure: INSERTION OF ARTERIOVENOUS (AV) GORE-TEX 58mm x 10cm GRAFT INTO LEFT ARM;  Surgeon: Elam Dutch, MD;  Location: [place];  Service: Vascular;  Laterality: Left;  . IR GENERIC HISTORICAL  10/18/2016   IR RADIOLOGIST EVAL & MGMT 10/18/2016 [name], MD [facility] INTERV RAD  . IR RADIOLOGIST EVAL & MGMT  05/30/2017  . IR RADIOLOGIST EVAL & MGMT  08/28/2018  . surgery for liver ca    . THROMBECTOMY AND REVISION OF  ARTERIOVENTOUS (AV) GORETEX  GRAFT Left 06/03/2018   Procedure: THROMBECTOMY AND REVISION OF ARTERIOVENTOUS (AV) GORETEX  GRAFT ARM;  Surgeon: Elam Dutch, MD;  [facility];  Service: Vascular;  Laterality: Left;     Social History Social History   Tobacco Use  . Smoking status: Never Smoker  . Smokeless tobacco: Never Used  Substance Use Topics  . Alcohol use: Yes    Alcohol/week: 3.0 - 4.0 standard drinks    Types: 3 - 4 Cans of beer per week    Comment: she quit 02/2015   . Drug use: No    Family History Family History  Problem Relation Age of Onset  . Lung cancer Brother   . Breast cancer Other   . Breast cancer Sister   . Gastric cancer Sister   . Lung cancer Sister   . Colon cancer Neg Hx   . Colon polyps Neg Hx   . Rectal cancer Neg Hx   . Stomach cancer Neg Hx   . Esophageal cancer Neg Hx     Allergies  No Known Allergies   Current Outpatient Medications  Medication Sig Dispense Refill  . ALPRAZolam (XANAX) 0.5 MG tablet TAKE 1 TABLET BY MOUTH ONCE DAILY AT BEDTIME 30 tablet 2  . amLODipine (NORVASC) 10 MG  tablet TAKE 1 TABLET BY MOUTH ONCE DAILY 90 tablet 2  . B Complex-C-Zn-Folic Acid (DIALYVITE 193 WITH ZINC) 0.8 MG TABS Take 1 tablet by mouth daily.  12  . Cholecalciferol (VITAMIN D3) 1000 UNITS CAPS Take 1,000 Units by mouth daily.     . cloNIDine (CATAPRES) 0.1 MG tablet Take 1 tablet (0.1 mg total) by mouth 3 (three) times daily. 90 tablet 0  . CONSTULOSE 10 GM/15ML solution  TAKE 30 MLS BY MOUTH DAILY 473 mL 3  . Darbepoetin Alfa (ARANESP) 100 MCG/0.5ML SOSY injection Inject 0.5 mLs (100 mcg total) into the vein every Thursday with hemodialysis. 4.2 mL   . folic acid (FOLVITE) 1 MG tablet TAKE 1 TABLET BY MOUTH ONCE DAILY 90 tablet 1  . hydrALAZINE (APRESOLINE) 100 MG tablet TAKE 1 TABLET BY MOUTH THREE TIMES DAILY 270 tablet 1  . ketotifen (ZADITOR) 0.025 % ophthalmic solution Place 1 drop into both eyes 2 (two) times daily.    Marland Kitchen  lidocaine-prilocaine (EMLA) cream APPLY SMALL AMOUNT TO ACCESS SITE (AVF) 3 TIMES A WEEK 30 MINUTES BEFORE DIALYSIS. COVER WITH OCCLUSIVE DRESSING (SARAN WRAP)  12  . propranolol (INDERAL) 40 MG tablet TAKE 1 TABLET BY MOUTH TWICE DAILY 180 tablet 3  . Propylene Glycol (SYSTANE BALANCE) 0.6 % SOLN Place 1 drop into both eyes daily.     No current facility-administered medications for this visit.     ROS:   General:  No weight loss, Fever, chills  HEENT: No recent headaches, no nasal bleeding, no visual changes, no sore throat  Neurologic: No dizziness, blackouts, seizures. No recent symptoms of stroke or mini- stroke. No recent episodes of slurred speech, or temporary blindness.  Cardiac: No recent episodes of chest pain/pressure, no shortness of breath at rest.  No shortness of breath with exertion.  Denies history of atrial fibrillation or irregular heartbeat  Vascular: No history of rest pain in feet.  No history of claudication.  No history of non-healing ulcer, No history of DVT   Pulmonary: No home oxygen, no productive cough, no hemoptysis,  No asthma or wheezing  Musculoskeletal:  [ ]  Arthritis, [ ]  Low back pain,  [ ]  Joint pain  Hematologic:No history of hypercoagulable state.  No history of easy bleeding.  No history of anemia  Gastrointestinal: No hematochezia or melena,  No gastroesophageal reflux, no trouble swallowing  Urinary: [ ]  chronic Kidney disease, [x ] on HD - [ ]  MWF or [x ] TTHS, [ ]  Burning with urination, [ ]  Frequent urination, [ ]  Difficulty urinating;   Skin: No rashes  Psychological: No history of anxiety,  No history of depression   Physical Examination  Vitals:   10/23/18 1309  BP: (!) 160/62  Pulse: 63  Resp: 16  Temp: 98.8 F (37.1 C)  TempSrc: Oral  SpO2: 99%  Weight: 110 lb (49.9 kg)  Height: 5\' 3"  (1.6 m)    Body mass index is 19.49 kg/m.  General:  Alert and oriented, no acute distress HEENT: Normal, normocephalic Neck: No  bruit or JVD Pulmonary: Clear to auscultation bilaterally Cardiac: Regular Rate and Rhythm without murmur Gastrointestinal: Soft, non-tender, non-distended, no mass, no scars Skin: No rash Extremity Pulses:   radial, brachial pulses bilaterally, palpable thrill throughout the AV graft.   Musculoskeletal: No deformity or edema  Neurologic: Upper and lower extremity motor 5/5 and symmetric  DATA:  Duplex of the left AV graft Stenosis at arterial anastomosis with flow rate 892 cm/sno frank stenosis  noted.   ASSESSMENT:  ESRD left AV graft access malfunction   PLAN: I will schedule her for shuntogram of the left AV graft with possible intervention.  This is her first access site and has a thrombectomy followed by unknown procedure at [facility].  If we can not keep this graft working well she may need to look for new access.  I explained this to her today and she states she understands.    Roxy Horseman PA-C [facility] [place] Office: [phone]

## 2018-10-24 DIAGNOSIS — Z992 Dependence on renal dialysis: Secondary | ICD-10-CM | POA: Diagnosis not present

## 2018-10-24 DIAGNOSIS — N2581 Secondary hyperparathyroidism of renal origin: Secondary | ICD-10-CM | POA: Diagnosis not present

## 2018-10-24 DIAGNOSIS — D631 Anemia in chronic kidney disease: Secondary | ICD-10-CM | POA: Diagnosis not present

## 2018-10-24 DIAGNOSIS — N186 End stage renal disease: Secondary | ICD-10-CM | POA: Diagnosis not present

## 2018-10-25 ENCOUNTER — Ambulatory Visit (HOSPITAL_COMMUNITY)
Admission: RE | Admit: 2018-10-25 | Discharge: 2018-10-25 | Disposition: A | Discharge status: Home / Self Care | Payer: Medicare Other | Source: Ambulatory Visit | Attending: Vascular Surgery | Admitting: Vascular Surgery

## 2018-10-25 ENCOUNTER — Telehealth: Payer: Self-pay | Admitting: *Deleted

## 2018-10-25 ENCOUNTER — Encounter (HOSPITAL_COMMUNITY)
Admission: RE | Disposition: A | Discharge status: Home / Self Care | Payer: Self-pay | Source: Ambulatory Visit | Attending: Vascular Surgery

## 2018-10-25 ENCOUNTER — Other Ambulatory Visit: Payer: Self-pay

## 2018-10-25 DIAGNOSIS — E785 Hyperlipidemia, unspecified: Secondary | ICD-10-CM | POA: Insufficient documentation

## 2018-10-25 DIAGNOSIS — N186 End stage renal disease: Secondary | ICD-10-CM | POA: Diagnosis not present

## 2018-10-25 DIAGNOSIS — K7030 Alcoholic cirrhosis of liver without ascites: Secondary | ICD-10-CM | POA: Diagnosis not present

## 2018-10-25 DIAGNOSIS — I120 Hypertensive chronic kidney disease with stage 5 chronic kidney disease or end stage renal disease: Secondary | ICD-10-CM | POA: Diagnosis not present

## 2018-10-25 DIAGNOSIS — T82898A Other specified complication of vascular prosthetic devices, implants and grafts, initial encounter: Secondary | ICD-10-CM | POA: Diagnosis not present

## 2018-10-25 DIAGNOSIS — F419 Anxiety disorder, unspecified: Secondary | ICD-10-CM | POA: Diagnosis not present

## 2018-10-25 DIAGNOSIS — F329 Major depressive disorder, single episode, unspecified: Secondary | ICD-10-CM | POA: Insufficient documentation

## 2018-10-25 DIAGNOSIS — Y832 Surgical operation with anastomosis, bypass or graft as the cause of abnormal reaction of the patient, or of later complication, without mention of misadventure at the time of the procedure: Secondary | ICD-10-CM | POA: Insufficient documentation

## 2018-10-25 DIAGNOSIS — M8580 Other specified disorders of bone density and structure, unspecified site: Secondary | ICD-10-CM | POA: Insufficient documentation

## 2018-10-25 DIAGNOSIS — Z992 Dependence on renal dialysis: Secondary | ICD-10-CM | POA: Diagnosis not present

## 2018-10-25 HISTORY — PX: A/V FISTULAGRAM: CATH118298

## 2018-10-25 LAB — POCT I-STAT, CHEM 8
BUN: 34 mg/dL — ABNORMAL HIGH (ref 8–23)
CALCIUM ION: 1.16 mmol/L (ref 1.15–1.40)
CHLORIDE: 93 mmol/L — AB (ref 98–111)
Creatinine, Ser: 4.6 mg/dL — ABNORMAL HIGH (ref 0.44–1.00)
Glucose, Bld: 88 mg/dL (ref 70–99)
HEMATOCRIT: 40 % (ref 36.0–46.0)
Hemoglobin: 13.6 g/dL (ref 12.0–15.0)
Potassium: 3.5 mmol/L (ref 3.5–5.1)
SODIUM: 139 mmol/L (ref 135–145)
TCO2: 34 mmol/L — AB (ref 22–32)

## 2018-10-25 SURGERY — A/V FISTULAGRAM
Anesthesia: LOCAL

## 2018-10-25 MED ORDER — LIDOCAINE HCL (PF) 1 % IJ SOLN
INTRAMUSCULAR | Status: AC
  Filled 2018-10-25: qty 30

## 2018-10-25 MED ORDER — HEPARIN (PORCINE) IN NACL 1000-0.9 UT/500ML-% IV SOLN
INTRAVENOUS | Status: AC
  Filled 2018-10-25: qty 500

## 2018-10-25 MED ORDER — HEPARIN (PORCINE) IN NACL 1000-0.9 UT/500ML-% IV SOLN
INTRAVENOUS | Status: DC | PRN
  Administered 2018-10-25: 500 mL

## 2018-10-25 MED ORDER — MORPHINE SULFATE (PF) 10 MG/ML IV SOLN: 2.0000 mg | INTRAVENOUS | Status: DC | PRN

## 2018-10-25 MED ORDER — LIDOCAINE HCL (PF) 1 % IJ SOLN
INTRAMUSCULAR | Status: DC | PRN
  Administered 2018-10-25: 2 mL

## 2018-10-25 MED ORDER — IODIXANOL 320 MG/ML IV SOLN
INTRAVENOUS | Status: DC | PRN
  Administered 2018-10-25: 35 mL via INTRAVENOUS

## 2018-10-25 MED ORDER — SODIUM CHLORIDE 0.9% FLUSH: 3.0000 mL | INTRAVENOUS | Status: DC | PRN

## 2018-10-25 SURGICAL SUPPLY — 10 items
COVER DOME SNAP 22 D (MISCELLANEOUS) ×2 IMPLANT
COVER PRB 48X5XTLSCP FOLD TPE (BAG) ×1 IMPLANT
COVER PROBE 5X48 (BAG) ×2
KIT MICROPUNCTURE NIT STIFF (SHEATH) ×1 IMPLANT
PROTECTION STATION PRESSURIZED (MISCELLANEOUS) ×2
SHEATH PROBE COVER 6X72 (BAG) ×1 IMPLANT
STATION PROTECTION PRESSURIZED (MISCELLANEOUS) ×1 IMPLANT
STOPCOCK MORSE 400PSI 3WAY (MISCELLANEOUS) ×2 IMPLANT
TRAY PV CATH (CUSTOM PROCEDURE TRAY) ×2 IMPLANT
TUBING CIL FLEX 10 FLL-RA (TUBING) ×2 IMPLANT

## 2018-10-25 NOTE — Telephone Encounter (Signed)
Documentation for future surgery if needed.

## 2018-10-25 NOTE — Interval H&P Note (Signed)
History and Physical Interval Note:  10/25/2018 1:22 PM  Sarah Harmon  has presented today for surgery, with the diagnosis of a/v graft complication  The various methods of treatment have been discussed with the patient and family. After consideration of risks, benefits and other options for treatment, the patient has consented to  Procedure(s): A/V FISTULAGRAM (N/A) as a surgical intervention .  The patient's history has been reviewed, patient examined, no change in status, stable for surgery.  I have reviewed the patient's chart and labs.  Questions were answered to the patient's satisfaction.     Ruta Hinds

## 2018-10-25 NOTE — Discharge Instructions (Signed)
Venogram, Care After °This sheet gives you information about how to care for yourself after your procedure. Your health care provider may also give you more specific instructions. If you have problems or questions, contact your health care provider. °What can I expect after the procedure? °After the procedure, it is common to have: °· Bruising or mild discomfort in the area where the IV was inserted (insertion site). ° °Follow these instructions at home: °Eating and drinking °· Follow instructions from your health care provider about eating or drinking restrictions. °· Drink a lot of fluids for the first several days after the procedure, as directed by your health care provider. This helps to wash (flush) the contrast out of your body. Examples of healthy fluids include water or low-calorie drinks. °General instructions °· Check your IV insertion area every day for signs of infection. Check for: °? Redness, swelling, or pain. °? Fluid or blood. °? Warmth. °? Pus or a bad smell. °· Take over-the-counter and prescription medicines only as told by your health care provider. °· Rest and return to your normal activities as told by your health care provider. Ask your health care provider what activities are safe for you. °· Do not drive for 24 hours if you were given a medicine to help you relax (sedative), or until your health care provider approves. °· Keep all follow-up visits as told by your health care provider. This is important. °Contact a health care provider if: °· Your skin becomes itchy or you develop a rash or hives. °· You have a fever that does not get better with medicine. °· You feel nauseous. °· You vomit. °· You have redness, swelling, or pain around the insertion site. °· You have fluid or blood coming from the insertion site. °· Your insertion area feels warm to the touch. °· You have pus or a bad smell coming from the insertion site. °Get help right away if: °· You have difficulty breathing or  shortness of breath. °· You develop chest pain. °· You faint. °· You feel very dizzy. °These symptoms may represent a serious problem that is an emergency. Do not wait to see if the symptoms will go away. Get medical help right away. Call your local emergency services (911 in the [place] Do not drive yourself to the hospital. °Summary °· After your procedure, it is common to have bruising or mild discomfort in the area where the IV was inserted. °· You should check your IV insertion area every day for signs of infection. °· Take over-the-counter and prescription medicines only as told by your health care provider. °· You should drink a lot of fluids for the first several days after the procedure to help flush the contrast from your body. °This information is not intended to replace advice given to you by your health care provider. Make sure you discuss any questions you have with your health care provider. °Document Released: 09/24/2013 Document Revised: 10/28/2016 Document Reviewed: 10/28/2016 °Elsevier Interactive Patient Education © 2017 [employer] ° °

## 2018-10-25 NOTE — Op Note (Signed)
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  The patient was placed in supine position Angio table.  Patient's left upper extremity was prepped and draped in usual sterile fashion.  Ultrasound was used to identify the patient's left upper arm AV graft.  This was patent.  A permanent copy image was obtained and placed in the patient's medical record.  Next local anesthesia was then treated over the AV graft.  A micropuncture needle was used to cannulate the graft without difficulty.  The micropuncture wire was advanced through the needle and the micropuncture sheath placed over this.  Contrast angiogram was then obtained of the venous outflow.  This showed occlusion of the patient's innominate vein.  However the AV graft was draining from the subclavian vein into the patient's left internal jugular vein and briskly filling the right internal jugular vein.  This seemed well collateralized.  Next compression was held on the upper portion of the graft reflux contrast across the arterial anastomosis.  There was no significant flow-limiting stenosis on the arterial and the graft.  At this point the sheath was removed and hemostasis obtained with direct pressure.  The patient tolerated procedure well and there were no complications.  Operative management: The patient has a left innominate vein occlusion.  She has collateralized inflow is going across her left internal jugular vein to the right internal jugular vein.  As long as this graft remains patent I would continue using it.  However if the graft occludes most likely she would need a new access in the right  arm.  Ruta Hinds, MD [facility] Office: [phone] Pager: [phone]

## 2018-10-25 NOTE — Telephone Encounter (Signed)
-----   Message from Elam Dutch, MD sent at 10/25/2018  2:45 PM EST ----- Procedure: Left arm shuntogram  : The patient has a left innominate vein occlusion.  She has collateralized inflow is going across her left internal jugular vein to the right internal jugular vein.  As long as this graft remains patent I would continue using it.  However if the graft occludes most likely she would need a new access in the right arm.  Ruta Hinds, MD [facility] [place] Office: [phone] Pager: [phone]

## 2018-10-26 DIAGNOSIS — N2581 Secondary hyperparathyroidism of renal origin: Secondary | ICD-10-CM | POA: Diagnosis not present

## 2018-10-26 DIAGNOSIS — Z992 Dependence on renal dialysis: Secondary | ICD-10-CM | POA: Diagnosis not present

## 2018-10-26 DIAGNOSIS — N186 End stage renal disease: Secondary | ICD-10-CM | POA: Diagnosis not present

## 2018-10-26 DIAGNOSIS — D631 Anemia in chronic kidney disease: Secondary | ICD-10-CM | POA: Diagnosis not present

## 2018-10-28 ENCOUNTER — Encounter (HOSPITAL_COMMUNITY): Payer: Self-pay | Admitting: Vascular Surgery

## 2018-10-29 DIAGNOSIS — N2581 Secondary hyperparathyroidism of renal origin: Secondary | ICD-10-CM | POA: Diagnosis not present

## 2018-10-29 DIAGNOSIS — D631 Anemia in chronic kidney disease: Secondary | ICD-10-CM | POA: Diagnosis not present

## 2018-10-29 DIAGNOSIS — Z992 Dependence on renal dialysis: Secondary | ICD-10-CM | POA: Diagnosis not present

## 2018-10-29 DIAGNOSIS — N186 End stage renal disease: Secondary | ICD-10-CM | POA: Diagnosis not present

## 2018-10-31 ENCOUNTER — Other Ambulatory Visit: Payer: Self-pay | Admitting: *Deleted

## 2018-10-31 DIAGNOSIS — D631 Anemia in chronic kidney disease: Secondary | ICD-10-CM | POA: Diagnosis not present

## 2018-10-31 DIAGNOSIS — N186 End stage renal disease: Secondary | ICD-10-CM | POA: Diagnosis not present

## 2018-10-31 DIAGNOSIS — Z992 Dependence on renal dialysis: Secondary | ICD-10-CM | POA: Diagnosis not present

## 2018-10-31 DIAGNOSIS — N2581 Secondary hyperparathyroidism of renal origin: Secondary | ICD-10-CM | POA: Diagnosis not present

## 2018-10-31 NOTE — Patient Outreach (Signed)
[facility]) Care Management  10/31/2018  Sarah Harmon 27-Apr-1946 [id number]   RN Health Coach attempted #1up outreach call to patient.  Patient was unavailable.No voicemail pick up.  Plan: RN will call patient again within 10 business days.  [place] [facility] Management [phone]

## 2018-11-01 ENCOUNTER — Ambulatory Visit: Payer: Self-pay | Admitting: *Deleted

## 2018-11-02 DIAGNOSIS — Z992 Dependence on renal dialysis: Secondary | ICD-10-CM | POA: Diagnosis not present

## 2018-11-02 DIAGNOSIS — D631 Anemia in chronic kidney disease: Secondary | ICD-10-CM | POA: Diagnosis not present

## 2018-11-02 DIAGNOSIS — N2581 Secondary hyperparathyroidism of renal origin: Secondary | ICD-10-CM | POA: Diagnosis not present

## 2018-11-02 DIAGNOSIS — N186 End stage renal disease: Secondary | ICD-10-CM | POA: Diagnosis not present

## 2018-11-05 DIAGNOSIS — Z992 Dependence on renal dialysis: Secondary | ICD-10-CM | POA: Diagnosis not present

## 2018-11-05 DIAGNOSIS — N186 End stage renal disease: Secondary | ICD-10-CM | POA: Diagnosis not present

## 2018-11-05 DIAGNOSIS — D631 Anemia in chronic kidney disease: Secondary | ICD-10-CM | POA: Diagnosis not present

## 2018-11-05 DIAGNOSIS — N2581 Secondary hyperparathyroidism of renal origin: Secondary | ICD-10-CM | POA: Diagnosis not present

## 2018-11-07 DIAGNOSIS — N2581 Secondary hyperparathyroidism of renal origin: Secondary | ICD-10-CM | POA: Diagnosis not present

## 2018-11-07 DIAGNOSIS — Z992 Dependence on renal dialysis: Secondary | ICD-10-CM | POA: Diagnosis not present

## 2018-11-07 DIAGNOSIS — N186 End stage renal disease: Secondary | ICD-10-CM | POA: Diagnosis not present

## 2018-11-07 DIAGNOSIS — D631 Anemia in chronic kidney disease: Secondary | ICD-10-CM | POA: Diagnosis not present

## 2018-11-09 DIAGNOSIS — Z992 Dependence on renal dialysis: Secondary | ICD-10-CM | POA: Diagnosis not present

## 2018-11-09 DIAGNOSIS — N186 End stage renal disease: Secondary | ICD-10-CM | POA: Diagnosis not present

## 2018-11-09 DIAGNOSIS — D631 Anemia in chronic kidney disease: Secondary | ICD-10-CM | POA: Diagnosis not present

## 2018-11-09 DIAGNOSIS — N2581 Secondary hyperparathyroidism of renal origin: Secondary | ICD-10-CM | POA: Diagnosis not present

## 2018-11-11 DIAGNOSIS — D631 Anemia in chronic kidney disease: Secondary | ICD-10-CM | POA: Diagnosis not present

## 2018-11-11 DIAGNOSIS — N186 End stage renal disease: Secondary | ICD-10-CM | POA: Diagnosis not present

## 2018-11-11 DIAGNOSIS — Z992 Dependence on renal dialysis: Secondary | ICD-10-CM | POA: Diagnosis not present

## 2018-11-11 DIAGNOSIS — N2581 Secondary hyperparathyroidism of renal origin: Secondary | ICD-10-CM | POA: Diagnosis not present

## 2018-11-12 ENCOUNTER — Encounter: Payer: Self-pay | Admitting: *Deleted

## 2018-11-13 DIAGNOSIS — N186 End stage renal disease: Secondary | ICD-10-CM | POA: Diagnosis not present

## 2018-11-13 DIAGNOSIS — N2581 Secondary hyperparathyroidism of renal origin: Secondary | ICD-10-CM | POA: Diagnosis not present

## 2018-11-13 DIAGNOSIS — D631 Anemia in chronic kidney disease: Secondary | ICD-10-CM | POA: Diagnosis not present

## 2018-11-13 DIAGNOSIS — Z992 Dependence on renal dialysis: Secondary | ICD-10-CM | POA: Diagnosis not present

## 2018-11-15 ENCOUNTER — Inpatient Hospital Stay: Payer: Medicare Other | Attending: Hematology

## 2018-11-15 ENCOUNTER — Ambulatory Visit (HOSPITAL_COMMUNITY)
Admission: RE | Admit: 2018-11-15 | Discharge: 2018-11-15 | Disposition: A | Discharge status: Home / Self Care | Payer: Medicare Other | Source: Ambulatory Visit | Attending: Hematology | Admitting: Hematology

## 2018-11-15 DIAGNOSIS — C220 Liver cell carcinoma: Secondary | ICD-10-CM | POA: Insufficient documentation

## 2018-11-15 DIAGNOSIS — Z9889 Other specified postprocedural states: Secondary | ICD-10-CM | POA: Insufficient documentation

## 2018-11-15 DIAGNOSIS — K8020 Calculus of gallbladder without cholecystitis without obstruction: Secondary | ICD-10-CM | POA: Diagnosis not present

## 2018-11-15 DIAGNOSIS — N281 Cyst of kidney, acquired: Secondary | ICD-10-CM | POA: Insufficient documentation

## 2018-11-15 DIAGNOSIS — K7460 Unspecified cirrhosis of liver: Secondary | ICD-10-CM | POA: Insufficient documentation

## 2018-11-15 LAB — CBC WITH DIFFERENTIAL (CANCER CENTER ONLY)
ABS IMMATURE GRANULOCYTES: 0.01 10*3/uL (ref 0.00–0.07)
Basophils Absolute: 0 10*3/uL (ref 0.0–0.1)
Basophils Relative: 1 %
EOS ABS: 0.2 10*3/uL (ref 0.0–0.5)
Eosinophils Relative: 6 %
HEMATOCRIT: 33.8 % — AB (ref 36.0–46.0)
HEMOGLOBIN: 11.1 g/dL — AB (ref 12.0–15.0)
IMMATURE GRANULOCYTES: 0 %
LYMPHS ABS: 0.9 10*3/uL (ref 0.7–4.0)
LYMPHS PCT: 20 %
MCH: 29.9 pg (ref 26.0–34.0)
MCHC: 32.8 g/dL (ref 30.0–36.0)
MCV: 91.1 fL (ref 80.0–100.0)
MONOS PCT: 12 %
Monocytes Absolute: 0.5 10*3/uL (ref 0.1–1.0)
NEUTROS ABS: 2.6 10*3/uL (ref 1.7–7.7)
NEUTROS PCT: 61 %
Platelet Count: 90 10*3/uL — ABNORMAL LOW (ref 150–400)
RBC: 3.71 MIL/uL — ABNORMAL LOW (ref 3.87–5.11)
RDW: 15.2 % (ref 11.5–15.5)
WBC Count: 4.3 10*3/uL (ref 4.0–10.5)
nRBC: 0 % (ref 0.0–0.2)

## 2018-11-15 LAB — CMP (CANCER CENTER ONLY)
ALBUMIN: 3.5 g/dL (ref 3.5–5.0)
ALK PHOS: 89 U/L (ref 38–126)
ALT: 12 U/L (ref 0–44)
ANION GAP: 13 (ref 5–15)
AST: 25 U/L (ref 15–41)
BUN: 32 mg/dL — ABNORMAL HIGH (ref 8–23)
CALCIUM: 10 mg/dL (ref 8.9–10.3)
CHLORIDE: 90 mmol/L — AB (ref 98–111)
CO2: 30 mmol/L (ref 22–32)
Creatinine: 4.97 mg/dL (ref 0.44–1.00)
GFR, Est AFR Am: 9 mL/min — ABNORMAL LOW (ref >60)
GFR, Estimated: 8 mL/min — ABNORMAL LOW (ref >60)
GLUCOSE: 95 mg/dL (ref 70–99)
Potassium: 3.9 mmol/L (ref 3.5–5.1)
SODIUM: 133 mmol/L — AB (ref 135–145)
Total Bilirubin: 0.7 mg/dL (ref 0.3–1.2)
Total Protein: 8 g/dL (ref 6.5–8.1)

## 2018-11-16 DIAGNOSIS — N2581 Secondary hyperparathyroidism of renal origin: Secondary | ICD-10-CM | POA: Diagnosis not present

## 2018-11-16 DIAGNOSIS — N186 End stage renal disease: Secondary | ICD-10-CM | POA: Diagnosis not present

## 2018-11-16 DIAGNOSIS — Z992 Dependence on renal dialysis: Secondary | ICD-10-CM | POA: Diagnosis not present

## 2018-11-16 DIAGNOSIS — D631 Anemia in chronic kidney disease: Secondary | ICD-10-CM | POA: Diagnosis not present

## 2018-11-16 LAB — AFP TUMOR MARKER: AFP, Serum, Tumor Marker: 7 ng/mL (ref 0.0–8.3)

## 2018-11-17 ENCOUNTER — Other Ambulatory Visit: Payer: Self-pay | Admitting: Hematology

## 2018-11-17 DIAGNOSIS — I129 Hypertensive chronic kidney disease with stage 1 through stage 4 chronic kidney disease, or unspecified chronic kidney disease: Secondary | ICD-10-CM | POA: Diagnosis not present

## 2018-11-17 DIAGNOSIS — N186 End stage renal disease: Secondary | ICD-10-CM | POA: Diagnosis not present

## 2018-11-17 DIAGNOSIS — Z992 Dependence on renal dialysis: Secondary | ICD-10-CM | POA: Diagnosis not present

## 2018-11-17 DIAGNOSIS — D649 Anemia, unspecified: Secondary | ICD-10-CM

## 2018-11-17 DIAGNOSIS — D61818 Other pancytopenia: Secondary | ICD-10-CM

## 2018-11-19 DIAGNOSIS — D631 Anemia in chronic kidney disease: Secondary | ICD-10-CM | POA: Diagnosis not present

## 2018-11-19 DIAGNOSIS — Z992 Dependence on renal dialysis: Secondary | ICD-10-CM | POA: Diagnosis not present

## 2018-11-19 DIAGNOSIS — N186 End stage renal disease: Secondary | ICD-10-CM | POA: Diagnosis not present

## 2018-11-19 DIAGNOSIS — N2581 Secondary hyperparathyroidism of renal origin: Secondary | ICD-10-CM | POA: Diagnosis not present

## 2018-11-21 ENCOUNTER — Encounter: Payer: Self-pay | Admitting: Internal Medicine

## 2018-11-21 ENCOUNTER — Ambulatory Visit (INDEPENDENT_AMBULATORY_CARE_PROVIDER_SITE_OTHER): Payer: Medicare Other | Admitting: Internal Medicine

## 2018-11-21 VITALS — BP 120/76 | HR 65 | Temp 98.4°F | Ht 63.0 in | Wt 119.0 lb

## 2018-11-21 DIAGNOSIS — D631 Anemia in chronic kidney disease: Secondary | ICD-10-CM | POA: Diagnosis not present

## 2018-11-21 DIAGNOSIS — E222 Syndrome of inappropriate secretion of antidiuretic hormone: Secondary | ICD-10-CM | POA: Diagnosis not present

## 2018-11-21 DIAGNOSIS — N186 End stage renal disease: Secondary | ICD-10-CM | POA: Diagnosis not present

## 2018-11-21 DIAGNOSIS — I639 Cerebral infarction, unspecified: Secondary | ICD-10-CM

## 2018-11-21 DIAGNOSIS — N2581 Secondary hyperparathyroidism of renal origin: Secondary | ICD-10-CM | POA: Diagnosis not present

## 2018-11-21 DIAGNOSIS — N184 Chronic kidney disease, stage 4 (severe): Secondary | ICD-10-CM

## 2018-11-21 DIAGNOSIS — Z23 Encounter for immunization: Secondary | ICD-10-CM

## 2018-11-21 DIAGNOSIS — Z992 Dependence on renal dialysis: Secondary | ICD-10-CM | POA: Diagnosis not present

## 2018-11-21 DIAGNOSIS — R7302 Impaired glucose tolerance (oral): Secondary | ICD-10-CM

## 2018-11-21 DIAGNOSIS — I10 Essential (primary) hypertension: Secondary | ICD-10-CM

## 2018-11-21 NOTE — Assessment & Plan Note (Signed)
stable overall by history and exam, recent data reviewed with pt, and pt to continue medical treatment as before,  to f/u any worsening symptoms or concerns  

## 2018-11-21 NOTE — Assessment & Plan Note (Signed)
Improved,  to f/u any worsening symptoms or concerns 

## 2018-11-21 NOTE — Patient Instructions (Addendum)
You had the flu shot today  Please continue all other medications as before, and refills have been done if requested.  Please have the pharmacy call with any other refills you may need.  Please continue your efforts at being more active, low cholesterol diet, and weight control.  You are otherwise up to date with prevention measures today.  Please keep your appointments with your specialists as you may have planned  No further lab work needed today  Please return in 6 months, or sooner if needed

## 2018-11-21 NOTE — Progress Notes (Signed)
Subjective:    Patient ID: Sarah Harmon, female    DOB: [birth year], 72 y.o.   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  Pt denies neurological change such as new headache, facial or extremity weakness.  Pt denies polydipsia, polyuria, or low sugar symptoms. Pt states overall good compliance with treatment and medications, good tolerability, and has been trying to follow appropriate diet.  Pt denies worsening depressive symptoms, suicidal ideation or panic. No fever, night sweats, wt loss, loss of appetite, or other constitutional symptoms.  Pt states good ability with ADL's, has low fall risk, home safety reviewed and adequate, no other significant changes in hearing or vision, and only occasionally active with exercise.  Now on HD for about the last 3 months Tue-Thur-Sat, did have transfusion earlier this year for Hgb 6.5.  No new complaints Wt Readings from Last 3 Encounters:  11/21/18 119 lb (54 kg)  10/25/18 112 lb (50.8 kg)  10/23/18 110 lb (49.9 kg)   BP Readings from Last 3 Encounters:  11/21/18 120/76  10/25/18 (!) 149/59  10/23/18 (!) 160/62   Past Medical History:  Diagnosis Date  . ALCOHOL ABUSE 07/13/2010  . ANXIETY 12/22/2009  . Cancer [facility])    liver cancer  . DEPRESSION 12/22/2009  . 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 DJD (degenerative joint disease), cervical    patient denies on preop of 05/10/15   . FATIGUE 12/23/2010  . GALLSTONES 12/22/2009  . Gallstones   . Headache    occasional   . HYPERLIPIDEMIA 12/22/2009  . HYPERTENSION 12/22/2009  . HYPONATREMIA 12/22/2009  . LAENNEC'S  CIRRHOSIS 07/13/2010  . OSTEOPENIA 12/22/2009  . Pancytopenia 07/13/2010  . RENAL CYST, LEFT 12/22/2009  . VITAMIN D DEFICIENCY 12/23/2010  . WEIGHT LOSS 12/23/2010   Past Surgical History:  Procedure Laterality Date  . A/V FISTULAGRAM N/A 10/25/2018   Procedure: A/V FISTULAGRAM;  Surgeon: Elam Dutch, MD;  Location: [place] [facility];  Service: Cardiovascular;  Laterality: N/A;  . ABDOMINAL HYSTERECTOMY    . AV FISTULA PLACEMENT Left 05/21/2018   Procedure: INSERTION OF ARTERIOVENOUS (AV) GORE-TEX GRAFT UPPER ARM;  Surgeon: Elam Dutch, MD;  Location: [place] OR;  Service: Vascular;  Laterality: Left;  . AV FISTULA PLACEMENT Left 06/03/2018   Procedure: INSERTION OF ARTERIOVENOUS (AV) GORE-TEX 76mm x 10cm GRAFT INTO LEFT ARM;  Surgeon: Elam Dutch, MD;  Location: [place];  Service: Vascular;  Laterality: Left;  . IR GENERIC HISTORICAL  10/18/2016   IR RADIOLOGIST EVAL & MGMT 10/18/2016 [name], MD [facility] INTERV RAD  . IR RADIOLOGIST EVAL & MGMT  05/30/2017  . IR RADIOLOGIST EVAL & MGMT  08/28/2018  . surgery for liver ca    . THROMBECTOMY AND REVISION OF ARTERIOVENTOUS (AV) GORETEX  GRAFT Left 06/03/2018   Procedure: THROMBECTOMY AND REVISION OF ARTERIOVENTOUS (AV) GORETEX  GRAFT ARM;  Surgeon: Elam Dutch, MD;  Location: [place];  Service: Vascular;  Laterality: Left;    reports that she has never smoked. She has never used smokeless tobacco. She reports that she drinks about 3.0 - 4.0 standard drinks of alcohol per week. She reports that  she does not use drugs. family history includes Breast cancer in her other and sister; Gastric cancer in her sister; Lung cancer in her brother and sister. No Known Allergies Current Outpatient Medications on File Prior to Visit  Medication Sig Dispense Refill  . ALPRAZolam (XANAX) 0.5 MG tablet TAKE 1 TABLET BY MOUTH ONCE DAILY AT BEDTIME (Patient taking differently: Take 0.5 mg by mouth at bedtime. ) 30 tablet 2  . amLODipine (NORVASC) 10 MG  tablet TAKE 1 TABLET BY MOUTH ONCE DAILY (Patient taking differently: Take 10 mg by mouth daily. ) 90 tablet 2  . B Complex-C-Zn-Folic Acid (DIALYVITE 400 WITH ZINC) 0.8 MG TABS Take 1 tablet by mouth daily.  12  . Cholecalciferol (VITAMIN D3) 1000 UNITS CAPS Take 1,000 Units by mouth daily.     . CONSTULOSE 10 GM/15ML solution  TAKE 30 MLS BY MOUTH DAILY (Patient taking differently: Take 20 g by mouth daily. ) 473 mL 3  . Darbepoetin Alfa (ARANESP) 100 MCG/0.5ML SOSY injection Inject 0.5 mLs (100 mcg total) into the vein every Thursday with hemodialysis. 4.2 mL   . folic acid (FOLVITE) 1 MG tablet TAKE 1 TABLET BY MOUTH ONCE DAILY 90 tablet 1  . hydrALAZINE (APRESOLINE) 100 MG tablet TAKE 1 TABLET BY MOUTH THREE TIMES DAILY (Patient taking differently: Take 50 mg by mouth 2 (two) times daily. ) 270 tablet 1  . ketotifen (ZADITOR) 0.025 % ophthalmic solution Place 1 drop into both eyes 2 (two) times daily.    Marland Kitchen lidocaine-prilocaine (EMLA) cream Apply 1 application topically daily as needed (use before dialysis).   12  . propranolol (INDERAL) 40 MG tablet TAKE 1 TABLET BY MOUTH TWICE DAILY 180 tablet 3  . Propylene Glycol (SYSTANE BALANCE) 0.6 % SOLN Place 1 drop into both eyes daily.     No current facility-administered medications on file prior to visit.    Review of Systems  Constitutional: Negative for other unusual diaphoresis or sweats HENT: Negative for ear discharge or swelling Eyes: Negative for other worsening visual disturbances Respiratory: Negative for stridor or other swelling  Gastrointestinal: Negative for worsening distension or other blood Genitourinary: Negative for retention or other urinary change Musculoskeletal: Negative for other MSK pain or swelling Skin: Negative for color change or other new lesions Neurological: Negative for worsening tremors and other numbness  Psychiatric/Behavioral: Negative for worsening agitation or other fatigue All other system neg per pt     Objective:   Physical Exam BP 120/76   Pulse 65   Temp 98.4 F (36.9 C) (Oral)   Ht 5\' 3"  (1.6 m)   Wt 119 lb (54 kg)   SpO2 99%   BMI 21.08 kg/m  VS noted,  Constitutional: Pt appears in NAD HENT: Head: NCAT.  Right Ear: External ear normal.  Left Ear: External ear normal.  Eyes: . Pupils are equal, round, and reactive to light. Conjunctivae and EOM are normal Nose: without d/c or deformity Neck: Neck supple. Gross normal ROM Cardiovascular: Normal rate and regular rhythm.   Pulmonary/Chest: Effort normal and breath sounds without rales or wheezing.  Abd:  Soft, NT, ND, + BS, no organomegaly Neurological: Pt is alert. At baseline orientation, motor grossly intact Skin: Skin is warm. No rashes, other new lesions, no LE edema Psychiatric: Pt behavior is normal without agitation  No other exam findings Lab Results  Component Value Date   WBC 4.3 11/15/2018   HGB 11.1 (L) 11/15/2018   HCT 33.8 (L) 11/15/2018   PLT  90 (L) 11/15/2018   GLUCOSE 95 11/15/2018   CHOL 243 (H) 09/04/2017   TRIG 233.0 (H) 09/04/2017   HDL 128.30 09/04/2017   LDLDIRECT 85.0 09/04/2017   LDLCALC 77 02/28/2017   ALT 12 11/15/2018   AST 25 11/15/2018   NA 133 (L) 11/15/2018   K 3.9 11/15/2018   CL 90 (L) 11/15/2018   CREATININE 4.97 (HH) 11/15/2018   BUN 32 (H) 11/15/2018   CO2 30 11/15/2018   TSH 1.123 07/14/2018   INR 0.98 07/14/2018   HGBA1C 4.7 05/17/2018       Assessment & Plan:

## 2018-11-22 ENCOUNTER — Ambulatory Visit: Payer: Medicare Other | Admitting: Hematology

## 2018-11-23 DIAGNOSIS — N186 End stage renal disease: Secondary | ICD-10-CM | POA: Diagnosis not present

## 2018-11-23 DIAGNOSIS — N2581 Secondary hyperparathyroidism of renal origin: Secondary | ICD-10-CM | POA: Diagnosis not present

## 2018-11-23 DIAGNOSIS — D631 Anemia in chronic kidney disease: Secondary | ICD-10-CM | POA: Diagnosis not present

## 2018-11-23 DIAGNOSIS — Z992 Dependence on renal dialysis: Secondary | ICD-10-CM | POA: Diagnosis not present

## 2018-11-26 DIAGNOSIS — D631 Anemia in chronic kidney disease: Secondary | ICD-10-CM | POA: Diagnosis not present

## 2018-11-26 DIAGNOSIS — N186 End stage renal disease: Secondary | ICD-10-CM | POA: Diagnosis not present

## 2018-11-26 DIAGNOSIS — Z992 Dependence on renal dialysis: Secondary | ICD-10-CM | POA: Diagnosis not present

## 2018-11-26 DIAGNOSIS — N2581 Secondary hyperparathyroidism of renal origin: Secondary | ICD-10-CM | POA: Diagnosis not present

## 2018-11-28 DIAGNOSIS — Z992 Dependence on renal dialysis: Secondary | ICD-10-CM | POA: Diagnosis not present

## 2018-11-28 DIAGNOSIS — N2581 Secondary hyperparathyroidism of renal origin: Secondary | ICD-10-CM | POA: Diagnosis not present

## 2018-11-28 DIAGNOSIS — D631 Anemia in chronic kidney disease: Secondary | ICD-10-CM | POA: Diagnosis not present

## 2018-11-28 DIAGNOSIS — N186 End stage renal disease: Secondary | ICD-10-CM | POA: Diagnosis not present

## 2018-11-30 DIAGNOSIS — N186 End stage renal disease: Secondary | ICD-10-CM | POA: Diagnosis not present

## 2018-11-30 DIAGNOSIS — Z992 Dependence on renal dialysis: Secondary | ICD-10-CM | POA: Diagnosis not present

## 2018-11-30 DIAGNOSIS — D631 Anemia in chronic kidney disease: Secondary | ICD-10-CM | POA: Diagnosis not present

## 2018-11-30 DIAGNOSIS — N2581 Secondary hyperparathyroidism of renal origin: Secondary | ICD-10-CM | POA: Diagnosis not present

## 2018-12-03 DIAGNOSIS — N2581 Secondary hyperparathyroidism of renal origin: Secondary | ICD-10-CM | POA: Diagnosis not present

## 2018-12-03 DIAGNOSIS — N186 End stage renal disease: Secondary | ICD-10-CM | POA: Diagnosis not present

## 2018-12-03 DIAGNOSIS — D631 Anemia in chronic kidney disease: Secondary | ICD-10-CM | POA: Diagnosis not present

## 2018-12-03 DIAGNOSIS — Z992 Dependence on renal dialysis: Secondary | ICD-10-CM | POA: Diagnosis not present

## 2018-12-04 ENCOUNTER — Telehealth: Payer: Self-pay

## 2018-12-04 NOTE — Telephone Encounter (Signed)
Copied from [place] [phone]. Topic: General - Other >> Nov 21, 2018  1:19 PM Antonieta Iba C wrote: Reason for CRM: [facility] is calling in requesting a copy of pt's flu shot from this year.    Fax: [phone]  ATTN: [place] >> Dec 04, 2018  9:05 AM Bea Graff, NT wrote: [place] with [facility] checking status on receiving flu shot records for this pt. Fax#: [phone]

## 2018-12-04 NOTE — Telephone Encounter (Signed)
Done  Copied from [place] [phone]. Topic: General - Other >> Nov 21, 2018  1:19 PM Antonieta Iba C wrote: Reason for CRM: [facility] is calling in requesting a copy of pt's flu shot from this year.    Fax: [phone]  ATTN: [place] >> Dec 04, 2018  9:05 AM Bea Graff, NT wrote: [place] with [facility] checking status on receiving flu shot records for this pt. Fax#: [phone]

## 2018-12-05 DIAGNOSIS — D631 Anemia in chronic kidney disease: Secondary | ICD-10-CM | POA: Diagnosis not present

## 2018-12-05 DIAGNOSIS — N186 End stage renal disease: Secondary | ICD-10-CM | POA: Diagnosis not present

## 2018-12-05 DIAGNOSIS — Z992 Dependence on renal dialysis: Secondary | ICD-10-CM | POA: Diagnosis not present

## 2018-12-05 DIAGNOSIS — N2581 Secondary hyperparathyroidism of renal origin: Secondary | ICD-10-CM | POA: Diagnosis not present

## 2018-12-06 ENCOUNTER — Other Ambulatory Visit: Payer: Self-pay | Admitting: Internal Medicine

## 2018-12-07 DIAGNOSIS — Z992 Dependence on renal dialysis: Secondary | ICD-10-CM | POA: Diagnosis not present

## 2018-12-07 DIAGNOSIS — N186 End stage renal disease: Secondary | ICD-10-CM | POA: Diagnosis not present

## 2018-12-07 DIAGNOSIS — N2581 Secondary hyperparathyroidism of renal origin: Secondary | ICD-10-CM | POA: Diagnosis not present

## 2018-12-07 DIAGNOSIS — D631 Anemia in chronic kidney disease: Secondary | ICD-10-CM | POA: Diagnosis not present

## 2018-12-09 DIAGNOSIS — N2581 Secondary hyperparathyroidism of renal origin: Secondary | ICD-10-CM | POA: Diagnosis not present

## 2018-12-09 DIAGNOSIS — N186 End stage renal disease: Secondary | ICD-10-CM | POA: Diagnosis not present

## 2018-12-09 DIAGNOSIS — Z992 Dependence on renal dialysis: Secondary | ICD-10-CM | POA: Diagnosis not present

## 2018-12-09 DIAGNOSIS — D631 Anemia in chronic kidney disease: Secondary | ICD-10-CM | POA: Diagnosis not present

## 2018-12-12 DIAGNOSIS — Z992 Dependence on renal dialysis: Secondary | ICD-10-CM | POA: Diagnosis not present

## 2018-12-12 DIAGNOSIS — N186 End stage renal disease: Secondary | ICD-10-CM | POA: Diagnosis not present

## 2018-12-12 DIAGNOSIS — D631 Anemia in chronic kidney disease: Secondary | ICD-10-CM | POA: Diagnosis not present

## 2018-12-12 DIAGNOSIS — N2581 Secondary hyperparathyroidism of renal origin: Secondary | ICD-10-CM | POA: Diagnosis not present

## 2018-12-14 DIAGNOSIS — N2581 Secondary hyperparathyroidism of renal origin: Secondary | ICD-10-CM | POA: Diagnosis not present

## 2018-12-14 DIAGNOSIS — Z992 Dependence on renal dialysis: Secondary | ICD-10-CM | POA: Diagnosis not present

## 2018-12-14 DIAGNOSIS — D631 Anemia in chronic kidney disease: Secondary | ICD-10-CM | POA: Diagnosis not present

## 2018-12-14 DIAGNOSIS — N186 End stage renal disease: Secondary | ICD-10-CM | POA: Diagnosis not present

## 2018-12-16 DIAGNOSIS — D631 Anemia in chronic kidney disease: Secondary | ICD-10-CM | POA: Diagnosis not present

## 2018-12-16 DIAGNOSIS — Z992 Dependence on renal dialysis: Secondary | ICD-10-CM | POA: Diagnosis not present

## 2018-12-16 DIAGNOSIS — N2581 Secondary hyperparathyroidism of renal origin: Secondary | ICD-10-CM | POA: Diagnosis not present

## 2018-12-16 DIAGNOSIS — N186 End stage renal disease: Secondary | ICD-10-CM | POA: Diagnosis not present

## 2018-12-18 DIAGNOSIS — I129 Hypertensive chronic kidney disease with stage 1 through stage 4 chronic kidney disease, or unspecified chronic kidney disease: Secondary | ICD-10-CM | POA: Diagnosis not present

## 2018-12-18 DIAGNOSIS — Z992 Dependence on renal dialysis: Secondary | ICD-10-CM | POA: Diagnosis not present

## 2018-12-18 DIAGNOSIS — N186 End stage renal disease: Secondary | ICD-10-CM | POA: Diagnosis not present

## 2018-12-19 DIAGNOSIS — N2581 Secondary hyperparathyroidism of renal origin: Secondary | ICD-10-CM | POA: Diagnosis not present

## 2018-12-19 DIAGNOSIS — D509 Iron deficiency anemia, unspecified: Secondary | ICD-10-CM | POA: Diagnosis not present

## 2018-12-19 DIAGNOSIS — N186 End stage renal disease: Secondary | ICD-10-CM | POA: Diagnosis not present

## 2018-12-19 DIAGNOSIS — Z992 Dependence on renal dialysis: Secondary | ICD-10-CM | POA: Diagnosis not present

## 2018-12-19 DIAGNOSIS — D631 Anemia in chronic kidney disease: Secondary | ICD-10-CM | POA: Diagnosis not present

## 2018-12-21 DIAGNOSIS — D509 Iron deficiency anemia, unspecified: Secondary | ICD-10-CM | POA: Diagnosis not present

## 2018-12-21 DIAGNOSIS — Z992 Dependence on renal dialysis: Secondary | ICD-10-CM | POA: Diagnosis not present

## 2018-12-21 DIAGNOSIS — N186 End stage renal disease: Secondary | ICD-10-CM | POA: Diagnosis not present

## 2018-12-21 DIAGNOSIS — D631 Anemia in chronic kidney disease: Secondary | ICD-10-CM | POA: Diagnosis not present

## 2018-12-21 DIAGNOSIS — N2581 Secondary hyperparathyroidism of renal origin: Secondary | ICD-10-CM | POA: Diagnosis not present

## 2018-12-24 DIAGNOSIS — N186 End stage renal disease: Secondary | ICD-10-CM | POA: Diagnosis not present

## 2018-12-24 DIAGNOSIS — D509 Iron deficiency anemia, unspecified: Secondary | ICD-10-CM | POA: Diagnosis not present

## 2018-12-24 DIAGNOSIS — Z992 Dependence on renal dialysis: Secondary | ICD-10-CM | POA: Diagnosis not present

## 2018-12-24 DIAGNOSIS — D631 Anemia in chronic kidney disease: Secondary | ICD-10-CM | POA: Diagnosis not present

## 2018-12-24 DIAGNOSIS — N2581 Secondary hyperparathyroidism of renal origin: Secondary | ICD-10-CM | POA: Diagnosis not present

## 2018-12-25 ENCOUNTER — Other Ambulatory Visit: Payer: Self-pay | Admitting: Internal Medicine

## 2018-12-25 NOTE — Telephone Encounter (Signed)
Done erx 

## 2018-12-26 DIAGNOSIS — N186 End stage renal disease: Secondary | ICD-10-CM | POA: Diagnosis not present

## 2018-12-26 DIAGNOSIS — Z992 Dependence on renal dialysis: Secondary | ICD-10-CM | POA: Diagnosis not present

## 2018-12-26 DIAGNOSIS — D631 Anemia in chronic kidney disease: Secondary | ICD-10-CM | POA: Diagnosis not present

## 2018-12-26 DIAGNOSIS — D509 Iron deficiency anemia, unspecified: Secondary | ICD-10-CM | POA: Diagnosis not present

## 2018-12-26 DIAGNOSIS — N2581 Secondary hyperparathyroidism of renal origin: Secondary | ICD-10-CM | POA: Diagnosis not present

## 2018-12-28 DIAGNOSIS — N2581 Secondary hyperparathyroidism of renal origin: Secondary | ICD-10-CM | POA: Diagnosis not present

## 2018-12-28 DIAGNOSIS — Z992 Dependence on renal dialysis: Secondary | ICD-10-CM | POA: Diagnosis not present

## 2018-12-28 DIAGNOSIS — D509 Iron deficiency anemia, unspecified: Secondary | ICD-10-CM | POA: Diagnosis not present

## 2018-12-28 DIAGNOSIS — N186 End stage renal disease: Secondary | ICD-10-CM | POA: Diagnosis not present

## 2018-12-28 DIAGNOSIS — D631 Anemia in chronic kidney disease: Secondary | ICD-10-CM | POA: Diagnosis not present

## 2018-12-30 ENCOUNTER — Other Ambulatory Visit: Payer: Self-pay | Admitting: *Deleted

## 2018-12-30 NOTE — Patient Outreach (Signed)
[facility]) Care Management  12/30/2018  JODETTE WIK Jul 03, 1946 [id number]  RN Health Coach attempted follow up outreach call to patient.  Patient was unavailable. No voicemail message pick up. Plan: RN will call patient again within 30 days.  [place] Care Management [phone]

## 2018-12-31 DIAGNOSIS — N2581 Secondary hyperparathyroidism of renal origin: Secondary | ICD-10-CM | POA: Diagnosis not present

## 2018-12-31 DIAGNOSIS — D631 Anemia in chronic kidney disease: Secondary | ICD-10-CM | POA: Diagnosis not present

## 2018-12-31 DIAGNOSIS — N186 End stage renal disease: Secondary | ICD-10-CM | POA: Diagnosis not present

## 2018-12-31 DIAGNOSIS — Z992 Dependence on renal dialysis: Secondary | ICD-10-CM | POA: Diagnosis not present

## 2018-12-31 DIAGNOSIS — D509 Iron deficiency anemia, unspecified: Secondary | ICD-10-CM | POA: Diagnosis not present

## 2019-01-02 DIAGNOSIS — N186 End stage renal disease: Secondary | ICD-10-CM | POA: Diagnosis not present

## 2019-01-02 DIAGNOSIS — N2581 Secondary hyperparathyroidism of renal origin: Secondary | ICD-10-CM | POA: Diagnosis not present

## 2019-01-02 DIAGNOSIS — D509 Iron deficiency anemia, unspecified: Secondary | ICD-10-CM | POA: Diagnosis not present

## 2019-01-02 DIAGNOSIS — D631 Anemia in chronic kidney disease: Secondary | ICD-10-CM | POA: Diagnosis not present

## 2019-01-02 DIAGNOSIS — Z992 Dependence on renal dialysis: Secondary | ICD-10-CM | POA: Diagnosis not present

## 2019-01-03 ENCOUNTER — Ambulatory Visit: Payer: Self-pay | Admitting: *Deleted

## 2019-01-04 DIAGNOSIS — D631 Anemia in chronic kidney disease: Secondary | ICD-10-CM | POA: Diagnosis not present

## 2019-01-04 DIAGNOSIS — N186 End stage renal disease: Secondary | ICD-10-CM | POA: Diagnosis not present

## 2019-01-04 DIAGNOSIS — N2581 Secondary hyperparathyroidism of renal origin: Secondary | ICD-10-CM | POA: Diagnosis not present

## 2019-01-04 DIAGNOSIS — Z992 Dependence on renal dialysis: Secondary | ICD-10-CM | POA: Diagnosis not present

## 2019-01-04 DIAGNOSIS — D509 Iron deficiency anemia, unspecified: Secondary | ICD-10-CM | POA: Diagnosis not present

## 2019-01-07 DIAGNOSIS — N186 End stage renal disease: Secondary | ICD-10-CM | POA: Diagnosis not present

## 2019-01-07 DIAGNOSIS — D509 Iron deficiency anemia, unspecified: Secondary | ICD-10-CM | POA: Diagnosis not present

## 2019-01-07 DIAGNOSIS — N2581 Secondary hyperparathyroidism of renal origin: Secondary | ICD-10-CM | POA: Diagnosis not present

## 2019-01-07 DIAGNOSIS — Z992 Dependence on renal dialysis: Secondary | ICD-10-CM | POA: Diagnosis not present

## 2019-01-07 DIAGNOSIS — D631 Anemia in chronic kidney disease: Secondary | ICD-10-CM | POA: Diagnosis not present

## 2019-01-08 NOTE — Telephone Encounter (Signed)
This encounter was created in error - please disregard.

## 2019-01-09 DIAGNOSIS — Z992 Dependence on renal dialysis: Secondary | ICD-10-CM | POA: Diagnosis not present

## 2019-01-09 DIAGNOSIS — D509 Iron deficiency anemia, unspecified: Secondary | ICD-10-CM | POA: Diagnosis not present

## 2019-01-09 DIAGNOSIS — N186 End stage renal disease: Secondary | ICD-10-CM | POA: Diagnosis not present

## 2019-01-09 DIAGNOSIS — D631 Anemia in chronic kidney disease: Secondary | ICD-10-CM | POA: Diagnosis not present

## 2019-01-09 DIAGNOSIS — N2581 Secondary hyperparathyroidism of renal origin: Secondary | ICD-10-CM | POA: Diagnosis not present

## 2019-01-10 ENCOUNTER — Other Ambulatory Visit: Payer: Self-pay | Admitting: *Deleted

## 2019-01-10 NOTE — Patient Outreach (Signed)
[facility]) Care Management  01/10/2019  Sarah Harmon 02-27-1946 [id number]   RN Health Coach attempted follow up outreach call to patient.  Patient was unavailable. No voice mail message left. No voicemail pick up. Plan: RN will call patient again within 30 days. Unsuccessful outreach letter sent  [place] Management [phone]

## 2019-01-11 DIAGNOSIS — N2581 Secondary hyperparathyroidism of renal origin: Secondary | ICD-10-CM | POA: Diagnosis not present

## 2019-01-11 DIAGNOSIS — N186 End stage renal disease: Secondary | ICD-10-CM | POA: Diagnosis not present

## 2019-01-11 DIAGNOSIS — D631 Anemia in chronic kidney disease: Secondary | ICD-10-CM | POA: Diagnosis not present

## 2019-01-11 DIAGNOSIS — D509 Iron deficiency anemia, unspecified: Secondary | ICD-10-CM | POA: Diagnosis not present

## 2019-01-11 DIAGNOSIS — Z992 Dependence on renal dialysis: Secondary | ICD-10-CM | POA: Diagnosis not present

## 2019-01-13 ENCOUNTER — Other Ambulatory Visit: Payer: Self-pay | Admitting: *Deleted

## 2019-01-13 NOTE — Patient Outreach (Signed)
[facility]) Care Management  01/13/2019  GERALDINA PARROTT [birth year] [id number]   Follow up outreach attempts x 4. No answer. RN Health Coach unable to leave [employer] compliant voicemail. No response from letter mailed to patient. Case is being closed at this time.   Plan: Case closure Closure letter sent to patient and physician  [place] [place] Management [phone]

## 2019-01-14 DIAGNOSIS — N186 End stage renal disease: Secondary | ICD-10-CM | POA: Diagnosis not present

## 2019-01-14 DIAGNOSIS — Z992 Dependence on renal dialysis: Secondary | ICD-10-CM | POA: Diagnosis not present

## 2019-01-14 DIAGNOSIS — D631 Anemia in chronic kidney disease: Secondary | ICD-10-CM | POA: Diagnosis not present

## 2019-01-14 DIAGNOSIS — N2581 Secondary hyperparathyroidism of renal origin: Secondary | ICD-10-CM | POA: Diagnosis not present

## 2019-01-14 DIAGNOSIS — D509 Iron deficiency anemia, unspecified: Secondary | ICD-10-CM | POA: Diagnosis not present

## 2019-01-16 DIAGNOSIS — N186 End stage renal disease: Secondary | ICD-10-CM | POA: Diagnosis not present

## 2019-01-16 DIAGNOSIS — D509 Iron deficiency anemia, unspecified: Secondary | ICD-10-CM | POA: Diagnosis not present

## 2019-01-16 DIAGNOSIS — Z992 Dependence on renal dialysis: Secondary | ICD-10-CM | POA: Diagnosis not present

## 2019-01-16 DIAGNOSIS — N2581 Secondary hyperparathyroidism of renal origin: Secondary | ICD-10-CM | POA: Diagnosis not present

## 2019-01-16 DIAGNOSIS — D631 Anemia in chronic kidney disease: Secondary | ICD-10-CM | POA: Diagnosis not present

## 2019-01-18 DIAGNOSIS — Z23 Encounter for immunization: Secondary | ICD-10-CM | POA: Diagnosis not present

## 2019-01-18 DIAGNOSIS — D631 Anemia in chronic kidney disease: Secondary | ICD-10-CM | POA: Diagnosis not present

## 2019-01-18 DIAGNOSIS — N186 End stage renal disease: Secondary | ICD-10-CM | POA: Diagnosis not present

## 2019-01-18 DIAGNOSIS — I129 Hypertensive chronic kidney disease with stage 1 through stage 4 chronic kidney disease, or unspecified chronic kidney disease: Secondary | ICD-10-CM | POA: Diagnosis not present

## 2019-01-18 DIAGNOSIS — N2581 Secondary hyperparathyroidism of renal origin: Secondary | ICD-10-CM | POA: Diagnosis not present

## 2019-01-18 DIAGNOSIS — Z992 Dependence on renal dialysis: Secondary | ICD-10-CM | POA: Diagnosis not present

## 2019-01-21 DIAGNOSIS — N2581 Secondary hyperparathyroidism of renal origin: Secondary | ICD-10-CM | POA: Diagnosis not present

## 2019-01-21 DIAGNOSIS — D631 Anemia in chronic kidney disease: Secondary | ICD-10-CM | POA: Diagnosis not present

## 2019-01-21 DIAGNOSIS — Z23 Encounter for immunization: Secondary | ICD-10-CM | POA: Diagnosis not present

## 2019-01-21 DIAGNOSIS — Z992 Dependence on renal dialysis: Secondary | ICD-10-CM | POA: Diagnosis not present

## 2019-01-21 DIAGNOSIS — N186 End stage renal disease: Secondary | ICD-10-CM | POA: Diagnosis not present

## 2019-01-23 DIAGNOSIS — N2581 Secondary hyperparathyroidism of renal origin: Secondary | ICD-10-CM | POA: Diagnosis not present

## 2019-01-23 DIAGNOSIS — N186 End stage renal disease: Secondary | ICD-10-CM | POA: Diagnosis not present

## 2019-01-23 DIAGNOSIS — Z992 Dependence on renal dialysis: Secondary | ICD-10-CM | POA: Diagnosis not present

## 2019-01-23 DIAGNOSIS — D631 Anemia in chronic kidney disease: Secondary | ICD-10-CM | POA: Diagnosis not present

## 2019-01-23 DIAGNOSIS — Z23 Encounter for immunization: Secondary | ICD-10-CM | POA: Diagnosis not present

## 2019-01-25 DIAGNOSIS — Z23 Encounter for immunization: Secondary | ICD-10-CM | POA: Diagnosis not present

## 2019-01-25 DIAGNOSIS — D631 Anemia in chronic kidney disease: Secondary | ICD-10-CM | POA: Diagnosis not present

## 2019-01-25 DIAGNOSIS — Z992 Dependence on renal dialysis: Secondary | ICD-10-CM | POA: Diagnosis not present

## 2019-01-25 DIAGNOSIS — N186 End stage renal disease: Secondary | ICD-10-CM | POA: Diagnosis not present

## 2019-01-25 DIAGNOSIS — N2581 Secondary hyperparathyroidism of renal origin: Secondary | ICD-10-CM | POA: Diagnosis not present

## 2019-01-28 DIAGNOSIS — Z992 Dependence on renal dialysis: Secondary | ICD-10-CM | POA: Diagnosis not present

## 2019-01-28 DIAGNOSIS — N2581 Secondary hyperparathyroidism of renal origin: Secondary | ICD-10-CM | POA: Diagnosis not present

## 2019-01-28 DIAGNOSIS — N186 End stage renal disease: Secondary | ICD-10-CM | POA: Diagnosis not present

## 2019-01-28 DIAGNOSIS — D631 Anemia in chronic kidney disease: Secondary | ICD-10-CM | POA: Diagnosis not present

## 2019-01-28 DIAGNOSIS — Z23 Encounter for immunization: Secondary | ICD-10-CM | POA: Diagnosis not present

## 2019-01-30 DIAGNOSIS — Z23 Encounter for immunization: Secondary | ICD-10-CM | POA: Diagnosis not present

## 2019-01-30 DIAGNOSIS — Z992 Dependence on renal dialysis: Secondary | ICD-10-CM | POA: Diagnosis not present

## 2019-01-30 DIAGNOSIS — D631 Anemia in chronic kidney disease: Secondary | ICD-10-CM | POA: Diagnosis not present

## 2019-01-30 DIAGNOSIS — N186 End stage renal disease: Secondary | ICD-10-CM | POA: Diagnosis not present

## 2019-01-30 DIAGNOSIS — N2581 Secondary hyperparathyroidism of renal origin: Secondary | ICD-10-CM | POA: Diagnosis not present

## 2019-02-01 DIAGNOSIS — N2581 Secondary hyperparathyroidism of renal origin: Secondary | ICD-10-CM | POA: Diagnosis not present

## 2019-02-01 DIAGNOSIS — N186 End stage renal disease: Secondary | ICD-10-CM | POA: Diagnosis not present

## 2019-02-01 DIAGNOSIS — Z23 Encounter for immunization: Secondary | ICD-10-CM | POA: Diagnosis not present

## 2019-02-01 DIAGNOSIS — D631 Anemia in chronic kidney disease: Secondary | ICD-10-CM | POA: Diagnosis not present

## 2019-02-01 DIAGNOSIS — Z992 Dependence on renal dialysis: Secondary | ICD-10-CM | POA: Diagnosis not present

## 2019-02-03 ENCOUNTER — Other Ambulatory Visit: Payer: Self-pay | Admitting: Internal Medicine

## 2019-02-04 DIAGNOSIS — N2581 Secondary hyperparathyroidism of renal origin: Secondary | ICD-10-CM | POA: Diagnosis not present

## 2019-02-04 DIAGNOSIS — Z23 Encounter for immunization: Secondary | ICD-10-CM | POA: Diagnosis not present

## 2019-02-04 DIAGNOSIS — D631 Anemia in chronic kidney disease: Secondary | ICD-10-CM | POA: Diagnosis not present

## 2019-02-04 DIAGNOSIS — N186 End stage renal disease: Secondary | ICD-10-CM | POA: Diagnosis not present

## 2019-02-04 DIAGNOSIS — Z992 Dependence on renal dialysis: Secondary | ICD-10-CM | POA: Diagnosis not present

## 2019-02-06 DIAGNOSIS — N2581 Secondary hyperparathyroidism of renal origin: Secondary | ICD-10-CM | POA: Diagnosis not present

## 2019-02-06 DIAGNOSIS — Z23 Encounter for immunization: Secondary | ICD-10-CM | POA: Diagnosis not present

## 2019-02-06 DIAGNOSIS — Z992 Dependence on renal dialysis: Secondary | ICD-10-CM | POA: Diagnosis not present

## 2019-02-06 DIAGNOSIS — N186 End stage renal disease: Secondary | ICD-10-CM | POA: Diagnosis not present

## 2019-02-06 DIAGNOSIS — D631 Anemia in chronic kidney disease: Secondary | ICD-10-CM | POA: Diagnosis not present

## 2019-02-08 DIAGNOSIS — Z23 Encounter for immunization: Secondary | ICD-10-CM | POA: Diagnosis not present

## 2019-02-08 DIAGNOSIS — Z992 Dependence on renal dialysis: Secondary | ICD-10-CM | POA: Diagnosis not present

## 2019-02-08 DIAGNOSIS — N2581 Secondary hyperparathyroidism of renal origin: Secondary | ICD-10-CM | POA: Diagnosis not present

## 2019-02-08 DIAGNOSIS — N186 End stage renal disease: Secondary | ICD-10-CM | POA: Diagnosis not present

## 2019-02-08 DIAGNOSIS — D631 Anemia in chronic kidney disease: Secondary | ICD-10-CM | POA: Diagnosis not present

## 2019-02-11 DIAGNOSIS — D631 Anemia in chronic kidney disease: Secondary | ICD-10-CM | POA: Diagnosis not present

## 2019-02-11 DIAGNOSIS — Z23 Encounter for immunization: Secondary | ICD-10-CM | POA: Diagnosis not present

## 2019-02-11 DIAGNOSIS — Z992 Dependence on renal dialysis: Secondary | ICD-10-CM | POA: Diagnosis not present

## 2019-02-11 DIAGNOSIS — N186 End stage renal disease: Secondary | ICD-10-CM | POA: Diagnosis not present

## 2019-02-11 DIAGNOSIS — N2581 Secondary hyperparathyroidism of renal origin: Secondary | ICD-10-CM | POA: Diagnosis not present

## 2019-02-12 DIAGNOSIS — N186 End stage renal disease: Secondary | ICD-10-CM | POA: Diagnosis not present

## 2019-02-12 DIAGNOSIS — Z992 Dependence on renal dialysis: Secondary | ICD-10-CM | POA: Diagnosis not present

## 2019-02-12 DIAGNOSIS — I871 Compression of vein: Secondary | ICD-10-CM | POA: Diagnosis not present

## 2019-02-13 DIAGNOSIS — Z23 Encounter for immunization: Secondary | ICD-10-CM | POA: Diagnosis not present

## 2019-02-13 DIAGNOSIS — D631 Anemia in chronic kidney disease: Secondary | ICD-10-CM | POA: Diagnosis not present

## 2019-02-13 DIAGNOSIS — Z992 Dependence on renal dialysis: Secondary | ICD-10-CM | POA: Diagnosis not present

## 2019-02-13 DIAGNOSIS — N186 End stage renal disease: Secondary | ICD-10-CM | POA: Diagnosis not present

## 2019-02-13 DIAGNOSIS — N2581 Secondary hyperparathyroidism of renal origin: Secondary | ICD-10-CM | POA: Diagnosis not present

## 2019-02-15 DIAGNOSIS — N2581 Secondary hyperparathyroidism of renal origin: Secondary | ICD-10-CM | POA: Diagnosis not present

## 2019-02-15 DIAGNOSIS — N186 End stage renal disease: Secondary | ICD-10-CM | POA: Diagnosis not present

## 2019-02-15 DIAGNOSIS — Z992 Dependence on renal dialysis: Secondary | ICD-10-CM | POA: Diagnosis not present

## 2019-02-15 DIAGNOSIS — D631 Anemia in chronic kidney disease: Secondary | ICD-10-CM | POA: Diagnosis not present

## 2019-02-15 DIAGNOSIS — Z23 Encounter for immunization: Secondary | ICD-10-CM | POA: Diagnosis not present

## 2019-02-16 DIAGNOSIS — N186 End stage renal disease: Secondary | ICD-10-CM | POA: Diagnosis not present

## 2019-02-16 DIAGNOSIS — I129 Hypertensive chronic kidney disease with stage 1 through stage 4 chronic kidney disease, or unspecified chronic kidney disease: Secondary | ICD-10-CM | POA: Diagnosis not present

## 2019-02-16 DIAGNOSIS — Z992 Dependence on renal dialysis: Secondary | ICD-10-CM | POA: Diagnosis not present

## 2019-02-18 DIAGNOSIS — E876 Hypokalemia: Secondary | ICD-10-CM | POA: Diagnosis not present

## 2019-02-18 DIAGNOSIS — D631 Anemia in chronic kidney disease: Secondary | ICD-10-CM | POA: Diagnosis not present

## 2019-02-18 DIAGNOSIS — N2581 Secondary hyperparathyroidism of renal origin: Secondary | ICD-10-CM | POA: Diagnosis not present

## 2019-02-18 DIAGNOSIS — D509 Iron deficiency anemia, unspecified: Secondary | ICD-10-CM | POA: Diagnosis not present

## 2019-02-18 DIAGNOSIS — Z992 Dependence on renal dialysis: Secondary | ICD-10-CM | POA: Diagnosis not present

## 2019-02-18 DIAGNOSIS — Z23 Encounter for immunization: Secondary | ICD-10-CM | POA: Diagnosis not present

## 2019-02-18 DIAGNOSIS — N186 End stage renal disease: Secondary | ICD-10-CM | POA: Diagnosis not present

## 2019-02-20 DIAGNOSIS — D631 Anemia in chronic kidney disease: Secondary | ICD-10-CM | POA: Diagnosis not present

## 2019-02-20 DIAGNOSIS — N186 End stage renal disease: Secondary | ICD-10-CM | POA: Diagnosis not present

## 2019-02-20 DIAGNOSIS — Z992 Dependence on renal dialysis: Secondary | ICD-10-CM | POA: Diagnosis not present

## 2019-02-20 DIAGNOSIS — N2581 Secondary hyperparathyroidism of renal origin: Secondary | ICD-10-CM | POA: Diagnosis not present

## 2019-02-20 DIAGNOSIS — E876 Hypokalemia: Secondary | ICD-10-CM | POA: Diagnosis not present

## 2019-02-20 DIAGNOSIS — D509 Iron deficiency anemia, unspecified: Secondary | ICD-10-CM | POA: Diagnosis not present

## 2019-02-22 DIAGNOSIS — N2581 Secondary hyperparathyroidism of renal origin: Secondary | ICD-10-CM | POA: Diagnosis not present

## 2019-02-22 DIAGNOSIS — D631 Anemia in chronic kidney disease: Secondary | ICD-10-CM | POA: Diagnosis not present

## 2019-02-22 DIAGNOSIS — Z992 Dependence on renal dialysis: Secondary | ICD-10-CM | POA: Diagnosis not present

## 2019-02-22 DIAGNOSIS — D509 Iron deficiency anemia, unspecified: Secondary | ICD-10-CM | POA: Diagnosis not present

## 2019-02-22 DIAGNOSIS — E876 Hypokalemia: Secondary | ICD-10-CM | POA: Diagnosis not present

## 2019-02-22 DIAGNOSIS — N186 End stage renal disease: Secondary | ICD-10-CM | POA: Diagnosis not present

## 2019-02-25 DIAGNOSIS — N2581 Secondary hyperparathyroidism of renal origin: Secondary | ICD-10-CM | POA: Diagnosis not present

## 2019-02-25 DIAGNOSIS — E876 Hypokalemia: Secondary | ICD-10-CM | POA: Diagnosis not present

## 2019-02-25 DIAGNOSIS — D509 Iron deficiency anemia, unspecified: Secondary | ICD-10-CM | POA: Diagnosis not present

## 2019-02-25 DIAGNOSIS — Z992 Dependence on renal dialysis: Secondary | ICD-10-CM | POA: Diagnosis not present

## 2019-02-25 DIAGNOSIS — D631 Anemia in chronic kidney disease: Secondary | ICD-10-CM | POA: Diagnosis not present

## 2019-02-25 DIAGNOSIS — N186 End stage renal disease: Secondary | ICD-10-CM | POA: Diagnosis not present

## 2019-02-27 DIAGNOSIS — N186 End stage renal disease: Secondary | ICD-10-CM | POA: Diagnosis not present

## 2019-02-27 DIAGNOSIS — E876 Hypokalemia: Secondary | ICD-10-CM | POA: Diagnosis not present

## 2019-02-27 DIAGNOSIS — N2581 Secondary hyperparathyroidism of renal origin: Secondary | ICD-10-CM | POA: Diagnosis not present

## 2019-02-27 DIAGNOSIS — D631 Anemia in chronic kidney disease: Secondary | ICD-10-CM | POA: Diagnosis not present

## 2019-02-27 DIAGNOSIS — D509 Iron deficiency anemia, unspecified: Secondary | ICD-10-CM | POA: Diagnosis not present

## 2019-02-27 DIAGNOSIS — Z992 Dependence on renal dialysis: Secondary | ICD-10-CM | POA: Diagnosis not present

## 2019-03-01 DIAGNOSIS — Z992 Dependence on renal dialysis: Secondary | ICD-10-CM | POA: Diagnosis not present

## 2019-03-01 DIAGNOSIS — D509 Iron deficiency anemia, unspecified: Secondary | ICD-10-CM | POA: Diagnosis not present

## 2019-03-01 DIAGNOSIS — N186 End stage renal disease: Secondary | ICD-10-CM | POA: Diagnosis not present

## 2019-03-01 DIAGNOSIS — D631 Anemia in chronic kidney disease: Secondary | ICD-10-CM | POA: Diagnosis not present

## 2019-03-01 DIAGNOSIS — E876 Hypokalemia: Secondary | ICD-10-CM | POA: Diagnosis not present

## 2019-03-01 DIAGNOSIS — N2581 Secondary hyperparathyroidism of renal origin: Secondary | ICD-10-CM | POA: Diagnosis not present

## 2019-03-04 DIAGNOSIS — N2581 Secondary hyperparathyroidism of renal origin: Secondary | ICD-10-CM | POA: Diagnosis not present

## 2019-03-04 DIAGNOSIS — D631 Anemia in chronic kidney disease: Secondary | ICD-10-CM | POA: Diagnosis not present

## 2019-03-04 DIAGNOSIS — N186 End stage renal disease: Secondary | ICD-10-CM | POA: Diagnosis not present

## 2019-03-04 DIAGNOSIS — Z992 Dependence on renal dialysis: Secondary | ICD-10-CM | POA: Diagnosis not present

## 2019-03-04 DIAGNOSIS — D509 Iron deficiency anemia, unspecified: Secondary | ICD-10-CM | POA: Diagnosis not present

## 2019-03-04 DIAGNOSIS — E876 Hypokalemia: Secondary | ICD-10-CM | POA: Diagnosis not present

## 2019-03-06 DIAGNOSIS — D631 Anemia in chronic kidney disease: Secondary | ICD-10-CM | POA: Diagnosis not present

## 2019-03-06 DIAGNOSIS — N2581 Secondary hyperparathyroidism of renal origin: Secondary | ICD-10-CM | POA: Diagnosis not present

## 2019-03-06 DIAGNOSIS — E876 Hypokalemia: Secondary | ICD-10-CM | POA: Diagnosis not present

## 2019-03-06 DIAGNOSIS — D509 Iron deficiency anemia, unspecified: Secondary | ICD-10-CM | POA: Diagnosis not present

## 2019-03-06 DIAGNOSIS — N186 End stage renal disease: Secondary | ICD-10-CM | POA: Diagnosis not present

## 2019-03-06 DIAGNOSIS — Z992 Dependence on renal dialysis: Secondary | ICD-10-CM | POA: Diagnosis not present

## 2019-03-08 DIAGNOSIS — Z992 Dependence on renal dialysis: Secondary | ICD-10-CM | POA: Diagnosis not present

## 2019-03-08 DIAGNOSIS — N186 End stage renal disease: Secondary | ICD-10-CM | POA: Diagnosis not present

## 2019-03-08 DIAGNOSIS — D631 Anemia in chronic kidney disease: Secondary | ICD-10-CM | POA: Diagnosis not present

## 2019-03-08 DIAGNOSIS — E876 Hypokalemia: Secondary | ICD-10-CM | POA: Diagnosis not present

## 2019-03-08 DIAGNOSIS — D509 Iron deficiency anemia, unspecified: Secondary | ICD-10-CM | POA: Diagnosis not present

## 2019-03-08 DIAGNOSIS — N2581 Secondary hyperparathyroidism of renal origin: Secondary | ICD-10-CM | POA: Diagnosis not present

## 2019-03-11 DIAGNOSIS — D631 Anemia in chronic kidney disease: Secondary | ICD-10-CM | POA: Diagnosis not present

## 2019-03-11 DIAGNOSIS — N186 End stage renal disease: Secondary | ICD-10-CM | POA: Diagnosis not present

## 2019-03-11 DIAGNOSIS — E876 Hypokalemia: Secondary | ICD-10-CM | POA: Diagnosis not present

## 2019-03-11 DIAGNOSIS — Z992 Dependence on renal dialysis: Secondary | ICD-10-CM | POA: Diagnosis not present

## 2019-03-11 DIAGNOSIS — N2581 Secondary hyperparathyroidism of renal origin: Secondary | ICD-10-CM | POA: Diagnosis not present

## 2019-03-11 DIAGNOSIS — D509 Iron deficiency anemia, unspecified: Secondary | ICD-10-CM | POA: Diagnosis not present

## 2019-03-13 DIAGNOSIS — D509 Iron deficiency anemia, unspecified: Secondary | ICD-10-CM | POA: Diagnosis not present

## 2019-03-13 DIAGNOSIS — N186 End stage renal disease: Secondary | ICD-10-CM | POA: Diagnosis not present

## 2019-03-13 DIAGNOSIS — D631 Anemia in chronic kidney disease: Secondary | ICD-10-CM | POA: Diagnosis not present

## 2019-03-13 DIAGNOSIS — E876 Hypokalemia: Secondary | ICD-10-CM | POA: Diagnosis not present

## 2019-03-13 DIAGNOSIS — N2581 Secondary hyperparathyroidism of renal origin: Secondary | ICD-10-CM | POA: Diagnosis not present

## 2019-03-13 DIAGNOSIS — Z992 Dependence on renal dialysis: Secondary | ICD-10-CM | POA: Diagnosis not present

## 2019-03-15 DIAGNOSIS — D509 Iron deficiency anemia, unspecified: Secondary | ICD-10-CM | POA: Diagnosis not present

## 2019-03-15 DIAGNOSIS — D631 Anemia in chronic kidney disease: Secondary | ICD-10-CM | POA: Diagnosis not present

## 2019-03-15 DIAGNOSIS — E876 Hypokalemia: Secondary | ICD-10-CM | POA: Diagnosis not present

## 2019-03-15 DIAGNOSIS — Z992 Dependence on renal dialysis: Secondary | ICD-10-CM | POA: Diagnosis not present

## 2019-03-15 DIAGNOSIS — N186 End stage renal disease: Secondary | ICD-10-CM | POA: Diagnosis not present

## 2019-03-15 DIAGNOSIS — N2581 Secondary hyperparathyroidism of renal origin: Secondary | ICD-10-CM | POA: Diagnosis not present

## 2019-03-18 DIAGNOSIS — E876 Hypokalemia: Secondary | ICD-10-CM | POA: Diagnosis not present

## 2019-03-18 DIAGNOSIS — Z992 Dependence on renal dialysis: Secondary | ICD-10-CM | POA: Diagnosis not present

## 2019-03-18 DIAGNOSIS — N186 End stage renal disease: Secondary | ICD-10-CM | POA: Diagnosis not present

## 2019-03-18 DIAGNOSIS — D509 Iron deficiency anemia, unspecified: Secondary | ICD-10-CM | POA: Diagnosis not present

## 2019-03-18 DIAGNOSIS — N2581 Secondary hyperparathyroidism of renal origin: Secondary | ICD-10-CM | POA: Diagnosis not present

## 2019-03-18 DIAGNOSIS — D631 Anemia in chronic kidney disease: Secondary | ICD-10-CM | POA: Diagnosis not present

## 2019-03-19 DIAGNOSIS — Z992 Dependence on renal dialysis: Secondary | ICD-10-CM | POA: Diagnosis not present

## 2019-03-19 DIAGNOSIS — I129 Hypertensive chronic kidney disease with stage 1 through stage 4 chronic kidney disease, or unspecified chronic kidney disease: Secondary | ICD-10-CM | POA: Diagnosis not present

## 2019-03-19 DIAGNOSIS — N186 End stage renal disease: Secondary | ICD-10-CM | POA: Diagnosis not present

## 2019-03-20 DIAGNOSIS — Z23 Encounter for immunization: Secondary | ICD-10-CM | POA: Diagnosis not present

## 2019-03-20 DIAGNOSIS — Z992 Dependence on renal dialysis: Secondary | ICD-10-CM | POA: Diagnosis not present

## 2019-03-20 DIAGNOSIS — E876 Hypokalemia: Secondary | ICD-10-CM | POA: Diagnosis not present

## 2019-03-20 DIAGNOSIS — N2581 Secondary hyperparathyroidism of renal origin: Secondary | ICD-10-CM | POA: Diagnosis not present

## 2019-03-20 DIAGNOSIS — D509 Iron deficiency anemia, unspecified: Secondary | ICD-10-CM | POA: Diagnosis not present

## 2019-03-20 DIAGNOSIS — N186 End stage renal disease: Secondary | ICD-10-CM | POA: Diagnosis not present

## 2019-03-20 DIAGNOSIS — D631 Anemia in chronic kidney disease: Secondary | ICD-10-CM | POA: Diagnosis not present

## 2019-03-22 DIAGNOSIS — E876 Hypokalemia: Secondary | ICD-10-CM | POA: Diagnosis not present

## 2019-03-22 DIAGNOSIS — N186 End stage renal disease: Secondary | ICD-10-CM | POA: Diagnosis not present

## 2019-03-22 DIAGNOSIS — Z992 Dependence on renal dialysis: Secondary | ICD-10-CM | POA: Diagnosis not present

## 2019-03-22 DIAGNOSIS — D509 Iron deficiency anemia, unspecified: Secondary | ICD-10-CM | POA: Diagnosis not present

## 2019-03-22 DIAGNOSIS — N2581 Secondary hyperparathyroidism of renal origin: Secondary | ICD-10-CM | POA: Diagnosis not present

## 2019-03-22 DIAGNOSIS — Z23 Encounter for immunization: Secondary | ICD-10-CM | POA: Diagnosis not present

## 2019-03-25 DIAGNOSIS — Z992 Dependence on renal dialysis: Secondary | ICD-10-CM | POA: Diagnosis not present

## 2019-03-25 DIAGNOSIS — D509 Iron deficiency anemia, unspecified: Secondary | ICD-10-CM | POA: Diagnosis not present

## 2019-03-25 DIAGNOSIS — N2581 Secondary hyperparathyroidism of renal origin: Secondary | ICD-10-CM | POA: Diagnosis not present

## 2019-03-25 DIAGNOSIS — N186 End stage renal disease: Secondary | ICD-10-CM | POA: Diagnosis not present

## 2019-03-25 DIAGNOSIS — Z23 Encounter for immunization: Secondary | ICD-10-CM | POA: Diagnosis not present

## 2019-03-25 DIAGNOSIS — E876 Hypokalemia: Secondary | ICD-10-CM | POA: Diagnosis not present

## 2019-03-26 ENCOUNTER — Other Ambulatory Visit: Payer: Self-pay | Admitting: Internal Medicine

## 2019-03-26 NOTE — Telephone Encounter (Signed)
Done erx 

## 2019-03-27 DIAGNOSIS — Z992 Dependence on renal dialysis: Secondary | ICD-10-CM | POA: Diagnosis not present

## 2019-03-27 DIAGNOSIS — E876 Hypokalemia: Secondary | ICD-10-CM | POA: Diagnosis not present

## 2019-03-27 DIAGNOSIS — N186 End stage renal disease: Secondary | ICD-10-CM | POA: Diagnosis not present

## 2019-03-27 DIAGNOSIS — Z23 Encounter for immunization: Secondary | ICD-10-CM | POA: Diagnosis not present

## 2019-03-27 DIAGNOSIS — N2581 Secondary hyperparathyroidism of renal origin: Secondary | ICD-10-CM | POA: Diagnosis not present

## 2019-03-27 DIAGNOSIS — D509 Iron deficiency anemia, unspecified: Secondary | ICD-10-CM | POA: Diagnosis not present

## 2019-03-29 DIAGNOSIS — N2581 Secondary hyperparathyroidism of renal origin: Secondary | ICD-10-CM | POA: Diagnosis not present

## 2019-03-29 DIAGNOSIS — Z23 Encounter for immunization: Secondary | ICD-10-CM | POA: Diagnosis not present

## 2019-03-29 DIAGNOSIS — Z992 Dependence on renal dialysis: Secondary | ICD-10-CM | POA: Diagnosis not present

## 2019-03-29 DIAGNOSIS — D509 Iron deficiency anemia, unspecified: Secondary | ICD-10-CM | POA: Diagnosis not present

## 2019-03-29 DIAGNOSIS — E876 Hypokalemia: Secondary | ICD-10-CM | POA: Diagnosis not present

## 2019-03-29 DIAGNOSIS — N186 End stage renal disease: Secondary | ICD-10-CM | POA: Diagnosis not present

## 2019-04-01 DIAGNOSIS — D509 Iron deficiency anemia, unspecified: Secondary | ICD-10-CM | POA: Diagnosis not present

## 2019-04-01 DIAGNOSIS — Z992 Dependence on renal dialysis: Secondary | ICD-10-CM | POA: Diagnosis not present

## 2019-04-01 DIAGNOSIS — N2581 Secondary hyperparathyroidism of renal origin: Secondary | ICD-10-CM | POA: Diagnosis not present

## 2019-04-01 DIAGNOSIS — E876 Hypokalemia: Secondary | ICD-10-CM | POA: Diagnosis not present

## 2019-04-01 DIAGNOSIS — N186 End stage renal disease: Secondary | ICD-10-CM | POA: Diagnosis not present

## 2019-04-01 DIAGNOSIS — Z23 Encounter for immunization: Secondary | ICD-10-CM | POA: Diagnosis not present

## 2019-04-03 DIAGNOSIS — N186 End stage renal disease: Secondary | ICD-10-CM | POA: Diagnosis not present

## 2019-04-03 DIAGNOSIS — D509 Iron deficiency anemia, unspecified: Secondary | ICD-10-CM | POA: Diagnosis not present

## 2019-04-03 DIAGNOSIS — N2581 Secondary hyperparathyroidism of renal origin: Secondary | ICD-10-CM | POA: Diagnosis not present

## 2019-04-03 DIAGNOSIS — E876 Hypokalemia: Secondary | ICD-10-CM | POA: Diagnosis not present

## 2019-04-03 DIAGNOSIS — Z23 Encounter for immunization: Secondary | ICD-10-CM | POA: Diagnosis not present

## 2019-04-03 DIAGNOSIS — Z992 Dependence on renal dialysis: Secondary | ICD-10-CM | POA: Diagnosis not present

## 2019-04-05 DIAGNOSIS — Z992 Dependence on renal dialysis: Secondary | ICD-10-CM | POA: Diagnosis not present

## 2019-04-05 DIAGNOSIS — D509 Iron deficiency anemia, unspecified: Secondary | ICD-10-CM | POA: Diagnosis not present

## 2019-04-05 DIAGNOSIS — Z23 Encounter for immunization: Secondary | ICD-10-CM | POA: Diagnosis not present

## 2019-04-05 DIAGNOSIS — N186 End stage renal disease: Secondary | ICD-10-CM | POA: Diagnosis not present

## 2019-04-05 DIAGNOSIS — E876 Hypokalemia: Secondary | ICD-10-CM | POA: Diagnosis not present

## 2019-04-05 DIAGNOSIS — N2581 Secondary hyperparathyroidism of renal origin: Secondary | ICD-10-CM | POA: Diagnosis not present

## 2019-04-08 DIAGNOSIS — E876 Hypokalemia: Secondary | ICD-10-CM | POA: Diagnosis not present

## 2019-04-08 DIAGNOSIS — N2581 Secondary hyperparathyroidism of renal origin: Secondary | ICD-10-CM | POA: Diagnosis not present

## 2019-04-08 DIAGNOSIS — D509 Iron deficiency anemia, unspecified: Secondary | ICD-10-CM | POA: Diagnosis not present

## 2019-04-08 DIAGNOSIS — Z23 Encounter for immunization: Secondary | ICD-10-CM | POA: Diagnosis not present

## 2019-04-08 DIAGNOSIS — N186 End stage renal disease: Secondary | ICD-10-CM | POA: Diagnosis not present

## 2019-04-08 DIAGNOSIS — Z992 Dependence on renal dialysis: Secondary | ICD-10-CM | POA: Diagnosis not present

## 2019-04-10 DIAGNOSIS — N186 End stage renal disease: Secondary | ICD-10-CM | POA: Diagnosis not present

## 2019-04-10 DIAGNOSIS — Z992 Dependence on renal dialysis: Secondary | ICD-10-CM | POA: Diagnosis not present

## 2019-04-10 DIAGNOSIS — D509 Iron deficiency anemia, unspecified: Secondary | ICD-10-CM | POA: Diagnosis not present

## 2019-04-10 DIAGNOSIS — N2581 Secondary hyperparathyroidism of renal origin: Secondary | ICD-10-CM | POA: Diagnosis not present

## 2019-04-10 DIAGNOSIS — Z23 Encounter for immunization: Secondary | ICD-10-CM | POA: Diagnosis not present

## 2019-04-10 DIAGNOSIS — E876 Hypokalemia: Secondary | ICD-10-CM | POA: Diagnosis not present

## 2019-04-12 DIAGNOSIS — E876 Hypokalemia: Secondary | ICD-10-CM | POA: Diagnosis not present

## 2019-04-12 DIAGNOSIS — Z23 Encounter for immunization: Secondary | ICD-10-CM | POA: Diagnosis not present

## 2019-04-12 DIAGNOSIS — N2581 Secondary hyperparathyroidism of renal origin: Secondary | ICD-10-CM | POA: Diagnosis not present

## 2019-04-12 DIAGNOSIS — N186 End stage renal disease: Secondary | ICD-10-CM | POA: Diagnosis not present

## 2019-04-12 DIAGNOSIS — D509 Iron deficiency anemia, unspecified: Secondary | ICD-10-CM | POA: Diagnosis not present

## 2019-04-12 DIAGNOSIS — Z992 Dependence on renal dialysis: Secondary | ICD-10-CM | POA: Diagnosis not present

## 2019-04-15 ENCOUNTER — Other Ambulatory Visit: Payer: Self-pay

## 2019-04-15 DIAGNOSIS — N186 End stage renal disease: Secondary | ICD-10-CM | POA: Diagnosis not present

## 2019-04-15 DIAGNOSIS — Z23 Encounter for immunization: Secondary | ICD-10-CM | POA: Diagnosis not present

## 2019-04-15 DIAGNOSIS — Z992 Dependence on renal dialysis: Secondary | ICD-10-CM | POA: Diagnosis not present

## 2019-04-15 DIAGNOSIS — N2581 Secondary hyperparathyroidism of renal origin: Secondary | ICD-10-CM | POA: Diagnosis not present

## 2019-04-15 DIAGNOSIS — D509 Iron deficiency anemia, unspecified: Secondary | ICD-10-CM | POA: Diagnosis not present

## 2019-04-15 DIAGNOSIS — E876 Hypokalemia: Secondary | ICD-10-CM | POA: Diagnosis not present

## 2019-04-15 NOTE — Patient Outreach (Signed)
Screening:  Medicare tier 5 list:  Reviewed medical record. Placed call to patient who answered with her daughter. Patient reports that she is doing well. Reports she is taking her medications as prescribed Reports following up with MD as suggested. Daughter is available to assist as needed.   PLAN: patient and daughter deny needs.  Reviewed with patient about sending a successful outreach letter and patient agreed.  Confirmed address.  Tomasa Rand, RN, BSN, CEN [facility] [employer] [phone]

## 2019-04-17 DIAGNOSIS — N186 End stage renal disease: Secondary | ICD-10-CM | POA: Diagnosis not present

## 2019-04-17 DIAGNOSIS — D509 Iron deficiency anemia, unspecified: Secondary | ICD-10-CM | POA: Diagnosis not present

## 2019-04-17 DIAGNOSIS — N2581 Secondary hyperparathyroidism of renal origin: Secondary | ICD-10-CM | POA: Diagnosis not present

## 2019-04-17 DIAGNOSIS — E876 Hypokalemia: Secondary | ICD-10-CM | POA: Diagnosis not present

## 2019-04-17 DIAGNOSIS — Z992 Dependence on renal dialysis: Secondary | ICD-10-CM | POA: Diagnosis not present

## 2019-04-17 DIAGNOSIS — Z23 Encounter for immunization: Secondary | ICD-10-CM | POA: Diagnosis not present

## 2019-04-18 DIAGNOSIS — N186 End stage renal disease: Secondary | ICD-10-CM | POA: Diagnosis not present

## 2019-04-18 DIAGNOSIS — I129 Hypertensive chronic kidney disease with stage 1 through stage 4 chronic kidney disease, or unspecified chronic kidney disease: Secondary | ICD-10-CM | POA: Diagnosis not present

## 2019-04-18 DIAGNOSIS — Z992 Dependence on renal dialysis: Secondary | ICD-10-CM | POA: Diagnosis not present

## 2019-04-19 DIAGNOSIS — N2581 Secondary hyperparathyroidism of renal origin: Secondary | ICD-10-CM | POA: Diagnosis not present

## 2019-04-19 DIAGNOSIS — N186 End stage renal disease: Secondary | ICD-10-CM | POA: Diagnosis not present

## 2019-04-19 DIAGNOSIS — Z992 Dependence on renal dialysis: Secondary | ICD-10-CM | POA: Diagnosis not present

## 2019-04-19 DIAGNOSIS — D631 Anemia in chronic kidney disease: Secondary | ICD-10-CM | POA: Diagnosis not present

## 2019-04-19 DIAGNOSIS — E876 Hypokalemia: Secondary | ICD-10-CM | POA: Diagnosis not present

## 2019-04-19 DIAGNOSIS — D509 Iron deficiency anemia, unspecified: Secondary | ICD-10-CM | POA: Diagnosis not present

## 2019-04-22 DIAGNOSIS — Z992 Dependence on renal dialysis: Secondary | ICD-10-CM | POA: Diagnosis not present

## 2019-04-22 DIAGNOSIS — N186 End stage renal disease: Secondary | ICD-10-CM | POA: Diagnosis not present

## 2019-04-22 DIAGNOSIS — D631 Anemia in chronic kidney disease: Secondary | ICD-10-CM | POA: Diagnosis not present

## 2019-04-22 DIAGNOSIS — N2581 Secondary hyperparathyroidism of renal origin: Secondary | ICD-10-CM | POA: Diagnosis not present

## 2019-04-22 DIAGNOSIS — D509 Iron deficiency anemia, unspecified: Secondary | ICD-10-CM | POA: Diagnosis not present

## 2019-04-22 DIAGNOSIS — E876 Hypokalemia: Secondary | ICD-10-CM | POA: Diagnosis not present

## 2019-04-24 DIAGNOSIS — N186 End stage renal disease: Secondary | ICD-10-CM | POA: Diagnosis not present

## 2019-04-24 DIAGNOSIS — D509 Iron deficiency anemia, unspecified: Secondary | ICD-10-CM | POA: Diagnosis not present

## 2019-04-24 DIAGNOSIS — N2581 Secondary hyperparathyroidism of renal origin: Secondary | ICD-10-CM | POA: Diagnosis not present

## 2019-04-24 DIAGNOSIS — D631 Anemia in chronic kidney disease: Secondary | ICD-10-CM | POA: Diagnosis not present

## 2019-04-24 DIAGNOSIS — Z992 Dependence on renal dialysis: Secondary | ICD-10-CM | POA: Diagnosis not present

## 2019-04-24 DIAGNOSIS — E876 Hypokalemia: Secondary | ICD-10-CM | POA: Diagnosis not present

## 2019-04-26 DIAGNOSIS — Z992 Dependence on renal dialysis: Secondary | ICD-10-CM | POA: Diagnosis not present

## 2019-04-26 DIAGNOSIS — D509 Iron deficiency anemia, unspecified: Secondary | ICD-10-CM | POA: Diagnosis not present

## 2019-04-26 DIAGNOSIS — N2581 Secondary hyperparathyroidism of renal origin: Secondary | ICD-10-CM | POA: Diagnosis not present

## 2019-04-26 DIAGNOSIS — E876 Hypokalemia: Secondary | ICD-10-CM | POA: Diagnosis not present

## 2019-04-26 DIAGNOSIS — N186 End stage renal disease: Secondary | ICD-10-CM | POA: Diagnosis not present

## 2019-04-26 DIAGNOSIS — D631 Anemia in chronic kidney disease: Secondary | ICD-10-CM | POA: Diagnosis not present

## 2019-04-29 DIAGNOSIS — E876 Hypokalemia: Secondary | ICD-10-CM | POA: Diagnosis not present

## 2019-04-29 DIAGNOSIS — N186 End stage renal disease: Secondary | ICD-10-CM | POA: Diagnosis not present

## 2019-04-29 DIAGNOSIS — Z992 Dependence on renal dialysis: Secondary | ICD-10-CM | POA: Diagnosis not present

## 2019-04-29 DIAGNOSIS — D509 Iron deficiency anemia, unspecified: Secondary | ICD-10-CM | POA: Diagnosis not present

## 2019-04-29 DIAGNOSIS — N2581 Secondary hyperparathyroidism of renal origin: Secondary | ICD-10-CM | POA: Diagnosis not present

## 2019-04-29 DIAGNOSIS — D631 Anemia in chronic kidney disease: Secondary | ICD-10-CM | POA: Diagnosis not present

## 2019-05-01 DIAGNOSIS — E876 Hypokalemia: Secondary | ICD-10-CM | POA: Diagnosis not present

## 2019-05-01 DIAGNOSIS — N186 End stage renal disease: Secondary | ICD-10-CM | POA: Diagnosis not present

## 2019-05-01 DIAGNOSIS — D631 Anemia in chronic kidney disease: Secondary | ICD-10-CM | POA: Diagnosis not present

## 2019-05-01 DIAGNOSIS — D509 Iron deficiency anemia, unspecified: Secondary | ICD-10-CM | POA: Diagnosis not present

## 2019-05-01 DIAGNOSIS — N2581 Secondary hyperparathyroidism of renal origin: Secondary | ICD-10-CM | POA: Diagnosis not present

## 2019-05-01 DIAGNOSIS — Z992 Dependence on renal dialysis: Secondary | ICD-10-CM | POA: Diagnosis not present

## 2019-05-03 DIAGNOSIS — D509 Iron deficiency anemia, unspecified: Secondary | ICD-10-CM | POA: Diagnosis not present

## 2019-05-03 DIAGNOSIS — N2581 Secondary hyperparathyroidism of renal origin: Secondary | ICD-10-CM | POA: Diagnosis not present

## 2019-05-03 DIAGNOSIS — N186 End stage renal disease: Secondary | ICD-10-CM | POA: Diagnosis not present

## 2019-05-03 DIAGNOSIS — D631 Anemia in chronic kidney disease: Secondary | ICD-10-CM | POA: Diagnosis not present

## 2019-05-03 DIAGNOSIS — Z992 Dependence on renal dialysis: Secondary | ICD-10-CM | POA: Diagnosis not present

## 2019-05-03 DIAGNOSIS — E876 Hypokalemia: Secondary | ICD-10-CM | POA: Diagnosis not present

## 2019-05-06 DIAGNOSIS — D509 Iron deficiency anemia, unspecified: Secondary | ICD-10-CM | POA: Diagnosis not present

## 2019-05-06 DIAGNOSIS — N186 End stage renal disease: Secondary | ICD-10-CM | POA: Diagnosis not present

## 2019-05-06 DIAGNOSIS — E876 Hypokalemia: Secondary | ICD-10-CM | POA: Diagnosis not present

## 2019-05-06 DIAGNOSIS — D631 Anemia in chronic kidney disease: Secondary | ICD-10-CM | POA: Diagnosis not present

## 2019-05-06 DIAGNOSIS — N2581 Secondary hyperparathyroidism of renal origin: Secondary | ICD-10-CM | POA: Diagnosis not present

## 2019-05-06 DIAGNOSIS — Z992 Dependence on renal dialysis: Secondary | ICD-10-CM | POA: Diagnosis not present

## 2019-05-08 DIAGNOSIS — D509 Iron deficiency anemia, unspecified: Secondary | ICD-10-CM | POA: Diagnosis not present

## 2019-05-08 DIAGNOSIS — E876 Hypokalemia: Secondary | ICD-10-CM | POA: Diagnosis not present

## 2019-05-08 DIAGNOSIS — N186 End stage renal disease: Secondary | ICD-10-CM | POA: Diagnosis not present

## 2019-05-08 DIAGNOSIS — Z992 Dependence on renal dialysis: Secondary | ICD-10-CM | POA: Diagnosis not present

## 2019-05-08 DIAGNOSIS — N2581 Secondary hyperparathyroidism of renal origin: Secondary | ICD-10-CM | POA: Diagnosis not present

## 2019-05-08 DIAGNOSIS — D631 Anemia in chronic kidney disease: Secondary | ICD-10-CM | POA: Diagnosis not present

## 2019-05-10 DIAGNOSIS — N186 End stage renal disease: Secondary | ICD-10-CM | POA: Diagnosis not present

## 2019-05-10 DIAGNOSIS — D509 Iron deficiency anemia, unspecified: Secondary | ICD-10-CM | POA: Diagnosis not present

## 2019-05-10 DIAGNOSIS — Z992 Dependence on renal dialysis: Secondary | ICD-10-CM | POA: Diagnosis not present

## 2019-05-10 DIAGNOSIS — D631 Anemia in chronic kidney disease: Secondary | ICD-10-CM | POA: Diagnosis not present

## 2019-05-10 DIAGNOSIS — E876 Hypokalemia: Secondary | ICD-10-CM | POA: Diagnosis not present

## 2019-05-10 DIAGNOSIS — N2581 Secondary hyperparathyroidism of renal origin: Secondary | ICD-10-CM | POA: Diagnosis not present

## 2019-05-13 DIAGNOSIS — D509 Iron deficiency anemia, unspecified: Secondary | ICD-10-CM | POA: Diagnosis not present

## 2019-05-13 DIAGNOSIS — N2581 Secondary hyperparathyroidism of renal origin: Secondary | ICD-10-CM | POA: Diagnosis not present

## 2019-05-13 DIAGNOSIS — Z992 Dependence on renal dialysis: Secondary | ICD-10-CM | POA: Diagnosis not present

## 2019-05-13 DIAGNOSIS — D631 Anemia in chronic kidney disease: Secondary | ICD-10-CM | POA: Diagnosis not present

## 2019-05-13 DIAGNOSIS — E876 Hypokalemia: Secondary | ICD-10-CM | POA: Diagnosis not present

## 2019-05-13 DIAGNOSIS — N186 End stage renal disease: Secondary | ICD-10-CM | POA: Diagnosis not present

## 2019-05-15 DIAGNOSIS — Z992 Dependence on renal dialysis: Secondary | ICD-10-CM | POA: Diagnosis not present

## 2019-05-15 DIAGNOSIS — D509 Iron deficiency anemia, unspecified: Secondary | ICD-10-CM | POA: Diagnosis not present

## 2019-05-15 DIAGNOSIS — E876 Hypokalemia: Secondary | ICD-10-CM | POA: Diagnosis not present

## 2019-05-15 DIAGNOSIS — N186 End stage renal disease: Secondary | ICD-10-CM | POA: Diagnosis not present

## 2019-05-15 DIAGNOSIS — D631 Anemia in chronic kidney disease: Secondary | ICD-10-CM | POA: Diagnosis not present

## 2019-05-15 DIAGNOSIS — N2581 Secondary hyperparathyroidism of renal origin: Secondary | ICD-10-CM | POA: Diagnosis not present

## 2019-05-17 DIAGNOSIS — D631 Anemia in chronic kidney disease: Secondary | ICD-10-CM | POA: Diagnosis not present

## 2019-05-17 DIAGNOSIS — D509 Iron deficiency anemia, unspecified: Secondary | ICD-10-CM | POA: Diagnosis not present

## 2019-05-17 DIAGNOSIS — N2581 Secondary hyperparathyroidism of renal origin: Secondary | ICD-10-CM | POA: Diagnosis not present

## 2019-05-17 DIAGNOSIS — E876 Hypokalemia: Secondary | ICD-10-CM | POA: Diagnosis not present

## 2019-05-17 DIAGNOSIS — Z992 Dependence on renal dialysis: Secondary | ICD-10-CM | POA: Diagnosis not present

## 2019-05-17 DIAGNOSIS — N186 End stage renal disease: Secondary | ICD-10-CM | POA: Diagnosis not present

## 2019-05-19 DIAGNOSIS — Z992 Dependence on renal dialysis: Secondary | ICD-10-CM | POA: Diagnosis not present

## 2019-05-19 DIAGNOSIS — N186 End stage renal disease: Secondary | ICD-10-CM | POA: Diagnosis not present

## 2019-05-19 DIAGNOSIS — I129 Hypertensive chronic kidney disease with stage 1 through stage 4 chronic kidney disease, or unspecified chronic kidney disease: Secondary | ICD-10-CM | POA: Diagnosis not present

## 2019-05-20 DIAGNOSIS — N2581 Secondary hyperparathyroidism of renal origin: Secondary | ICD-10-CM | POA: Diagnosis not present

## 2019-05-20 DIAGNOSIS — N186 End stage renal disease: Secondary | ICD-10-CM | POA: Diagnosis not present

## 2019-05-20 DIAGNOSIS — E876 Hypokalemia: Secondary | ICD-10-CM | POA: Diagnosis not present

## 2019-05-20 DIAGNOSIS — D509 Iron deficiency anemia, unspecified: Secondary | ICD-10-CM | POA: Diagnosis not present

## 2019-05-20 DIAGNOSIS — D631 Anemia in chronic kidney disease: Secondary | ICD-10-CM | POA: Diagnosis not present

## 2019-05-20 DIAGNOSIS — Z992 Dependence on renal dialysis: Secondary | ICD-10-CM | POA: Diagnosis not present

## 2019-05-22 DIAGNOSIS — N2581 Secondary hyperparathyroidism of renal origin: Secondary | ICD-10-CM | POA: Diagnosis not present

## 2019-05-22 DIAGNOSIS — Z992 Dependence on renal dialysis: Secondary | ICD-10-CM | POA: Diagnosis not present

## 2019-05-22 DIAGNOSIS — N186 End stage renal disease: Secondary | ICD-10-CM | POA: Diagnosis not present

## 2019-05-22 DIAGNOSIS — D509 Iron deficiency anemia, unspecified: Secondary | ICD-10-CM | POA: Diagnosis not present

## 2019-05-22 DIAGNOSIS — D631 Anemia in chronic kidney disease: Secondary | ICD-10-CM | POA: Diagnosis not present

## 2019-05-22 DIAGNOSIS — E876 Hypokalemia: Secondary | ICD-10-CM | POA: Diagnosis not present

## 2019-05-24 DIAGNOSIS — D631 Anemia in chronic kidney disease: Secondary | ICD-10-CM | POA: Diagnosis not present

## 2019-05-24 DIAGNOSIS — N186 End stage renal disease: Secondary | ICD-10-CM | POA: Diagnosis not present

## 2019-05-24 DIAGNOSIS — D509 Iron deficiency anemia, unspecified: Secondary | ICD-10-CM | POA: Diagnosis not present

## 2019-05-24 DIAGNOSIS — Z992 Dependence on renal dialysis: Secondary | ICD-10-CM | POA: Diagnosis not present

## 2019-05-24 DIAGNOSIS — E876 Hypokalemia: Secondary | ICD-10-CM | POA: Diagnosis not present

## 2019-05-24 DIAGNOSIS — N2581 Secondary hyperparathyroidism of renal origin: Secondary | ICD-10-CM | POA: Diagnosis not present

## 2019-05-27 ENCOUNTER — Ambulatory Visit: Payer: Medicare Other | Admitting: Internal Medicine

## 2019-05-27 DIAGNOSIS — E876 Hypokalemia: Secondary | ICD-10-CM | POA: Diagnosis not present

## 2019-05-27 DIAGNOSIS — D631 Anemia in chronic kidney disease: Secondary | ICD-10-CM | POA: Diagnosis not present

## 2019-05-27 DIAGNOSIS — Z992 Dependence on renal dialysis: Secondary | ICD-10-CM | POA: Diagnosis not present

## 2019-05-27 DIAGNOSIS — N186 End stage renal disease: Secondary | ICD-10-CM | POA: Diagnosis not present

## 2019-05-27 DIAGNOSIS — N2581 Secondary hyperparathyroidism of renal origin: Secondary | ICD-10-CM | POA: Diagnosis not present

## 2019-05-27 DIAGNOSIS — D509 Iron deficiency anemia, unspecified: Secondary | ICD-10-CM | POA: Diagnosis not present

## 2019-05-28 ENCOUNTER — Other Ambulatory Visit: Payer: Self-pay

## 2019-05-28 ENCOUNTER — Encounter: Payer: Self-pay | Admitting: Internal Medicine

## 2019-05-28 ENCOUNTER — Ambulatory Visit (INDEPENDENT_AMBULATORY_CARE_PROVIDER_SITE_OTHER): Payer: Medicare Other | Admitting: Internal Medicine

## 2019-05-28 DIAGNOSIS — R7302 Impaired glucose tolerance (oral): Secondary | ICD-10-CM | POA: Diagnosis not present

## 2019-05-28 DIAGNOSIS — N186 End stage renal disease: Secondary | ICD-10-CM

## 2019-05-28 DIAGNOSIS — E611 Iron deficiency: Secondary | ICD-10-CM | POA: Diagnosis not present

## 2019-05-28 DIAGNOSIS — E538 Deficiency of other specified B group vitamins: Secondary | ICD-10-CM | POA: Diagnosis not present

## 2019-05-28 DIAGNOSIS — D631 Anemia in chronic kidney disease: Secondary | ICD-10-CM

## 2019-05-28 DIAGNOSIS — E559 Vitamin D deficiency, unspecified: Secondary | ICD-10-CM

## 2019-05-28 DIAGNOSIS — I10 Essential (primary) hypertension: Secondary | ICD-10-CM | POA: Diagnosis not present

## 2019-05-28 MED ORDER — AMLODIPINE BESYLATE 10 MG PO TABS: 10.0000 mg | ORAL_TABLET | Freq: Every day | ORAL | 3 refills | Status: DC

## 2019-05-28 MED ORDER — HYDRALAZINE HCL 100 MG PO TABS: 50.0000 mg | ORAL_TABLET | Freq: Three times a day (TID) | ORAL | 3 refills | Status: DC

## 2019-05-28 NOTE — Patient Instructions (Signed)
Please continue all other medications as before, and refills have been done if requested.  Please have the pharmacy call with any other refills you may need.  Please continue your efforts at being more active, low cholesterol diet, and weight control.  You are otherwise up to date with prevention measures today.  Please keep your appointments with your specialists as you may have planned  Please go to the LAB in the Basement (turn left off the elevator) for the tests to be done  You will be contacted by phone if any changes need to be made immediately.  Otherwise, you will receive a letter about your results with an explanation, but please check with MyChart first.  Please remember to sign up for MyChart if you have not done so, as this will be important to you in the future with finding out test results, communicating by private email, and scheduling acute appointments online when needed.  Please return in 6 months, or sooner if needed

## 2019-05-28 NOTE — Progress Notes (Signed)
Patient ID: Sarah Harmon, female   DOB: [birth year], 73 y.o.   MRN: [id number]  Virtual Visit via Video Note  I connected with Sarah Harmon on 05/28/19 at  3:20 PM EDT by a video enabled telemedicine application and verified that I am speaking with the correct person using two identifiers.  Location: Patient: at home Provider: at office   I discussed the limitations of evaluation and management by telemedicine and the availability of in person appointments. The patient expressed understanding and agreed to proceed.  History of Present Illness: Here for yearly f/u;  Overall doing ok;  Pt denies Chest pain, worsening SOB, DOE, wheezing, orthopnea, PND, worsening LE edema, palpitations, dizziness or syncope.  Pt denies neurological change such as new headache, facial or extremity weakness.  Pt denies polydipsia, polyuria, or low sugar symptoms. Pt states overall good compliance with treatment and medications, good tolerability, and has been trying to follow appropriate diet.  Pt denies worsening depressive symptoms, suicidal ideation or panic. No fever, night sweats, wt loss, loss of appetite, or other constitutional symptoms.  Pt states good ability with ADL's, has low fall risk, home safety reviewed and adequate, no other significant changes in hearing or vision, and not active with exercise.  No new complaints.  Needs a few refills.  Getting HD 3 times weekly as before. Pain and anxiety stable  BP at HD < 140/90 per pt Past Medical History:  Diagnosis Date  . ALCOHOL ABUSE 07/13/2010  . ANXIETY 12/22/2009  . Cancer [facility])    liver cancer  . DEPRESSION 12/22/2009  . 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 DJD (degenerative joint disease), cervical    patient denies on preop of 05/10/15   . FATIGUE 12/23/2010  . GALLSTONES 12/22/2009  . Gallstones   . Headache    occasional   . HYPERLIPIDEMIA 12/22/2009  . HYPERTENSION 12/22/2009  . HYPONATREMIA 12/22/2009  . 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 OSTEOPENIA 12/22/2009  . Pancytopenia 07/13/2010  . RENAL CYST, LEFT 12/22/2009  . VITAMIN D DEFICIENCY 12/23/2010  . WEIGHT LOSS 12/23/2010   Past Surgical History:  Procedure Laterality Date  . A/V FISTULAGRAM N/A 10/25/2018   Procedure: A/V FISTULAGRAM;  Surgeon: Elam Dutch, MD;  Location: [place] [facility];  Service: Cardiovascular;  Laterality: N/A;  . ABDOMINAL HYSTERECTOMY    . AV FISTULA PLACEMENT Left 05/21/2018   Procedure: INSERTION OF ARTERIOVENOUS (AV) GORE-TEX GRAFT UPPER ARM;  Surgeon: Elam Dutch, MD;  Location: [place] OR;  Service: Vascular;  Laterality: Left;  . AV FISTULA PLACEMENT Left 06/03/2018   Procedure: INSERTION OF ARTERIOVENOUS (AV) GORE-TEX 39mm x 10cm GRAFT INTO LEFT ARM;  Surgeon: Elam Dutch, MD;  Location: [place];  Service: Vascular;  Laterality: Left;  . IR GENERIC HISTORICAL  10/18/2016   IR RADIOLOGIST EVAL & MGMT 10/18/2016 [name], MD [facility] INTERV RAD  . IR RADIOLOGIST EVAL & MGMT  05/30/2017  . IR RADIOLOGIST EVAL & MGMT  08/28/2018  . surgery for liver ca    . THROMBECTOMY AND REVISION OF ARTERIOVENTOUS (AV) GORETEX  GRAFT Left 06/03/2018   Procedure: THROMBECTOMY AND REVISION OF ARTERIOVENTOUS (AV) GORETEX  GRAFT ARM;  Surgeon: Elam Dutch, MD;  Location: [place];  Service: Vascular;  Laterality: Left;    reports that she has never smoked. She has never used smokeless tobacco. She reports current alcohol use of about 3.0 - 4.0 standard drinks of alcohol per week. She reports that she does  not use drugs. family history includes Breast cancer in her sister and another family member; Gastric cancer in her sister; Lung cancer in her brother and sister. No Known Allergies Current Outpatient Medications on File Prior to Visit  Medication Sig Dispense Refill  . ALPRAZolam (XANAX) 0.5 MG tablet TAKE 1 TABLET BY MOUTH ONCE DAILY AT BEDTIME 30 tablet 2  . B Complex-C-Zn-Folic Acid (DIALYVITE 474 WITH ZINC) 0.8 MG TABS Take 1 tablet by mouth  daily.  12  . Cholecalciferol (VITAMIN D3) 1000 UNITS CAPS Take 1,000 Units by mouth daily.     . CONSTULOSE 10 GM/15ML solution  TAKE 30 MLS BY MOUTH DAILY (Patient taking differently: Take 20 g by mouth daily. ) 473 mL 3  . Darbepoetin Alfa (ARANESP) 100 MCG/0.5ML SOSY injection Inject 0.5 mLs (100 mcg total) into the vein every Thursday with hemodialysis. 4.2 mL   . folic acid (FOLVITE) 1 MG tablet TAKE 1 TABLET BY MOUTH ONCE DAILY 90 tablet 1  . ketotifen (ZADITOR) 0.025 % ophthalmic solution Place 1 drop into both eyes 2 (two) times daily.    Marland Kitchen lidocaine-prilocaine (EMLA) cream Apply 1 application topically daily as needed (use before dialysis).   12  . propranolol (INDERAL) 40 MG tablet TAKE 1 TABLET BY MOUTH TWICE DAILY 180 tablet 3  . Propylene Glycol (SYSTANE BALANCE) 0.6 % SOLN Place 1 drop into both eyes daily.     No current facility-administered medications on file prior to visit.     Observations/Objective: Alert, NAD, appropriate mood and affect, resps normal, cn 2-12 intact, moves all 4s, no visible rash or swelling Lab Results  Component Value Date   WBC 4.3 11/15/2018   HGB 11.1 (L) 11/15/2018   HCT 33.8 (L) 11/15/2018   PLT 90 (L) 11/15/2018   GLUCOSE 95 11/15/2018   CHOL 243 (H) 09/04/2017   TRIG 233.0 (H) 09/04/2017   HDL 128.30 09/04/2017   LDLDIRECT 85.0 09/04/2017   LDLCALC 77 02/28/2017   ALT 12 11/15/2018   AST 25 11/15/2018   NA 133 (L) 11/15/2018   K 3.9 11/15/2018   CL 90 (L) 11/15/2018   CREATININE 4.97 (HH) 11/15/2018   BUN 32 (H) 11/15/2018   CO2 30 11/15/2018   TSH 1.123 07/14/2018   INR 0.98 07/14/2018   HGBA1C 4.7 05/17/2018    Assessment and Plan: See notes  Follow Up Instructions: See notes   I discussed the assessment and treatment plan with the patient. The patient was provided an opportunity to ask questions and all were answered. The patient agreed with the plan and demonstrated an understanding of the instructions.   The patient  was advised to call back or seek an in-person evaluation if the symptoms worsen or if the condition fails to improve as anticipated.  Cathlean Cower, MD

## 2019-05-28 NOTE — Assessment & Plan Note (Signed)
stable overall by history and exam, recent data reviewed with pt, and pt to continue medical treatment as before,  to f/u any worsening symptoms or concerns  

## 2019-05-28 NOTE — Assessment & Plan Note (Signed)
stable overall by history and exam, recent data reviewed with pt, and pt to continue medical treatment as before,  to f/u any worsening symptoms or concerns, for cbc with lab f/u

## 2019-05-29 DIAGNOSIS — D631 Anemia in chronic kidney disease: Secondary | ICD-10-CM | POA: Diagnosis not present

## 2019-05-29 DIAGNOSIS — N2581 Secondary hyperparathyroidism of renal origin: Secondary | ICD-10-CM | POA: Diagnosis not present

## 2019-05-29 DIAGNOSIS — N186 End stage renal disease: Secondary | ICD-10-CM | POA: Diagnosis not present

## 2019-05-29 DIAGNOSIS — E876 Hypokalemia: Secondary | ICD-10-CM | POA: Diagnosis not present

## 2019-05-29 DIAGNOSIS — D509 Iron deficiency anemia, unspecified: Secondary | ICD-10-CM | POA: Diagnosis not present

## 2019-05-29 DIAGNOSIS — Z992 Dependence on renal dialysis: Secondary | ICD-10-CM | POA: Diagnosis not present

## 2019-05-30 DIAGNOSIS — I871 Compression of vein: Secondary | ICD-10-CM | POA: Diagnosis not present

## 2019-05-30 DIAGNOSIS — Z992 Dependence on renal dialysis: Secondary | ICD-10-CM | POA: Diagnosis not present

## 2019-05-30 DIAGNOSIS — N186 End stage renal disease: Secondary | ICD-10-CM | POA: Diagnosis not present

## 2019-05-31 DIAGNOSIS — D631 Anemia in chronic kidney disease: Secondary | ICD-10-CM | POA: Diagnosis not present

## 2019-05-31 DIAGNOSIS — N186 End stage renal disease: Secondary | ICD-10-CM | POA: Diagnosis not present

## 2019-05-31 DIAGNOSIS — N2581 Secondary hyperparathyroidism of renal origin: Secondary | ICD-10-CM | POA: Diagnosis not present

## 2019-05-31 DIAGNOSIS — E876 Hypokalemia: Secondary | ICD-10-CM | POA: Diagnosis not present

## 2019-05-31 DIAGNOSIS — D509 Iron deficiency anemia, unspecified: Secondary | ICD-10-CM | POA: Diagnosis not present

## 2019-05-31 DIAGNOSIS — Z992 Dependence on renal dialysis: Secondary | ICD-10-CM | POA: Diagnosis not present

## 2019-06-03 DIAGNOSIS — N186 End stage renal disease: Secondary | ICD-10-CM | POA: Diagnosis not present

## 2019-06-03 DIAGNOSIS — D631 Anemia in chronic kidney disease: Secondary | ICD-10-CM | POA: Diagnosis not present

## 2019-06-03 DIAGNOSIS — N2581 Secondary hyperparathyroidism of renal origin: Secondary | ICD-10-CM | POA: Diagnosis not present

## 2019-06-03 DIAGNOSIS — D509 Iron deficiency anemia, unspecified: Secondary | ICD-10-CM | POA: Diagnosis not present

## 2019-06-03 DIAGNOSIS — Z992 Dependence on renal dialysis: Secondary | ICD-10-CM | POA: Diagnosis not present

## 2019-06-03 DIAGNOSIS — E876 Hypokalemia: Secondary | ICD-10-CM | POA: Diagnosis not present

## 2019-06-05 DIAGNOSIS — D509 Iron deficiency anemia, unspecified: Secondary | ICD-10-CM | POA: Diagnosis not present

## 2019-06-05 DIAGNOSIS — N2581 Secondary hyperparathyroidism of renal origin: Secondary | ICD-10-CM | POA: Diagnosis not present

## 2019-06-05 DIAGNOSIS — E876 Hypokalemia: Secondary | ICD-10-CM | POA: Diagnosis not present

## 2019-06-05 DIAGNOSIS — N186 End stage renal disease: Secondary | ICD-10-CM | POA: Diagnosis not present

## 2019-06-05 DIAGNOSIS — D631 Anemia in chronic kidney disease: Secondary | ICD-10-CM | POA: Diagnosis not present

## 2019-06-05 DIAGNOSIS — Z992 Dependence on renal dialysis: Secondary | ICD-10-CM | POA: Diagnosis not present

## 2019-06-06 ENCOUNTER — Other Ambulatory Visit (INDEPENDENT_AMBULATORY_CARE_PROVIDER_SITE_OTHER): Payer: Medicare Other

## 2019-06-06 DIAGNOSIS — E559 Vitamin D deficiency, unspecified: Secondary | ICD-10-CM | POA: Diagnosis not present

## 2019-06-06 DIAGNOSIS — I10 Essential (primary) hypertension: Secondary | ICD-10-CM | POA: Diagnosis not present

## 2019-06-06 DIAGNOSIS — E611 Iron deficiency: Secondary | ICD-10-CM | POA: Diagnosis not present

## 2019-06-06 DIAGNOSIS — E538 Deficiency of other specified B group vitamins: Secondary | ICD-10-CM | POA: Diagnosis not present

## 2019-06-06 DIAGNOSIS — R7302 Impaired glucose tolerance (oral): Secondary | ICD-10-CM

## 2019-06-06 LAB — VITAMIN D 25 HYDROXY (VIT D DEFICIENCY, FRACTURES): VITD: 89.73 ng/mL (ref 30.00–100.00)

## 2019-06-06 LAB — VITAMIN B12: Vitamin B-12: 1168 pg/mL — ABNORMAL HIGH (ref 211–911)

## 2019-06-06 LAB — CBC WITH DIFFERENTIAL/PLATELET
Basophils Absolute: 0.1 10*3/uL (ref 0.0–0.1)
Basophils Relative: 1.5 % (ref 0.0–3.0)
Eosinophils Absolute: 0.2 10*3/uL (ref 0.0–0.7)
Eosinophils Relative: 5.4 % — ABNORMAL HIGH (ref 0.0–5.0)
HCT: 36.7 % (ref 36.0–46.0)
Hemoglobin: 11.9 g/dL — ABNORMAL LOW (ref 12.0–15.0)
Lymphocytes Relative: 21.1 % (ref 12.0–46.0)
Lymphs Abs: 0.9 10*3/uL (ref 0.7–4.0)
MCHC: 32.3 g/dL (ref 30.0–36.0)
MCV: 96.9 fl (ref 78.0–100.0)
Monocytes Absolute: 0.4 10*3/uL (ref 0.1–1.0)
Monocytes Relative: 10.2 % (ref 3.0–12.0)
Neutro Abs: 2.7 10*3/uL (ref 1.4–7.7)
Neutrophils Relative %: 61.8 % (ref 43.0–77.0)
Platelets: 123 10*3/uL — ABNORMAL LOW (ref 150.0–400.0)
RBC: 3.79 Mil/uL — ABNORMAL LOW (ref 3.87–5.11)
RDW: 19.2 % — ABNORMAL HIGH (ref 11.5–15.5)
WBC: 4.4 10*3/uL (ref 4.0–10.5)

## 2019-06-06 LAB — LIPID PANEL
Cholesterol: 156 mg/dL (ref 0–200)
HDL: 75.3 mg/dL (ref >39.00)
LDL Cholesterol: 65 mg/dL (ref 0–99)
NonHDL: 80.75
Total CHOL/HDL Ratio: 2
Triglycerides: 81 mg/dL (ref 0.0–149.0)
VLDL: 16.2 mg/dL (ref 0.0–40.0)

## 2019-06-06 LAB — BASIC METABOLIC PANEL
BUN: 16 mg/dL (ref 6–23)
CO2: 31 mEq/L (ref 19–32)
Calcium: 10.2 mg/dL (ref 8.4–10.5)
Chloride: 91 mEq/L — ABNORMAL LOW (ref 96–112)
Creatinine, Ser: 4.85 mg/dL (ref 0.40–1.20)
GFR: 10.59 mL/min — CL (ref >60.00)
Glucose, Bld: 99 mg/dL (ref 70–99)
Potassium: 3.6 mEq/L (ref 3.5–5.1)
Sodium: 135 mEq/L (ref 135–145)

## 2019-06-06 LAB — TSH: TSH: 0.96 u[IU]/mL (ref 0.35–4.50)

## 2019-06-06 LAB — HEPATIC FUNCTION PANEL
ALT: 14 U/L (ref 0–35)
AST: 17 U/L (ref 0–37)
Albumin: 4.3 g/dL (ref 3.5–5.2)
Alkaline Phosphatase: 111 U/L (ref 39–117)
Bilirubin, Direct: 0.1 mg/dL (ref 0.0–0.3)
Total Bilirubin: 0.7 mg/dL (ref 0.2–1.2)
Total Protein: 8.3 g/dL (ref 6.0–8.3)

## 2019-06-06 LAB — IBC PANEL
Iron: 112 ug/dL (ref 42–145)
Saturation Ratios: 37.7 % (ref 20.0–50.0)
Transferrin: 212 mg/dL (ref 212.0–360.0)

## 2019-06-06 LAB — HEMOGLOBIN A1C: Hgb A1c MFr Bld: 5.1 % (ref 4.6–6.5)

## 2019-06-07 DIAGNOSIS — D631 Anemia in chronic kidney disease: Secondary | ICD-10-CM | POA: Diagnosis not present

## 2019-06-07 DIAGNOSIS — N2581 Secondary hyperparathyroidism of renal origin: Secondary | ICD-10-CM | POA: Diagnosis not present

## 2019-06-07 DIAGNOSIS — D509 Iron deficiency anemia, unspecified: Secondary | ICD-10-CM | POA: Diagnosis not present

## 2019-06-07 DIAGNOSIS — E876 Hypokalemia: Secondary | ICD-10-CM | POA: Diagnosis not present

## 2019-06-07 DIAGNOSIS — N186 End stage renal disease: Secondary | ICD-10-CM | POA: Diagnosis not present

## 2019-06-07 DIAGNOSIS — Z992 Dependence on renal dialysis: Secondary | ICD-10-CM | POA: Diagnosis not present

## 2019-06-10 DIAGNOSIS — N186 End stage renal disease: Secondary | ICD-10-CM | POA: Diagnosis not present

## 2019-06-10 DIAGNOSIS — Z992 Dependence on renal dialysis: Secondary | ICD-10-CM | POA: Diagnosis not present

## 2019-06-10 DIAGNOSIS — D509 Iron deficiency anemia, unspecified: Secondary | ICD-10-CM | POA: Diagnosis not present

## 2019-06-10 DIAGNOSIS — N2581 Secondary hyperparathyroidism of renal origin: Secondary | ICD-10-CM | POA: Diagnosis not present

## 2019-06-10 DIAGNOSIS — D631 Anemia in chronic kidney disease: Secondary | ICD-10-CM | POA: Diagnosis not present

## 2019-06-10 DIAGNOSIS — E876 Hypokalemia: Secondary | ICD-10-CM | POA: Diagnosis not present

## 2019-06-12 DIAGNOSIS — Z992 Dependence on renal dialysis: Secondary | ICD-10-CM | POA: Diagnosis not present

## 2019-06-12 DIAGNOSIS — N2581 Secondary hyperparathyroidism of renal origin: Secondary | ICD-10-CM | POA: Diagnosis not present

## 2019-06-12 DIAGNOSIS — D631 Anemia in chronic kidney disease: Secondary | ICD-10-CM | POA: Diagnosis not present

## 2019-06-12 DIAGNOSIS — N186 End stage renal disease: Secondary | ICD-10-CM | POA: Diagnosis not present

## 2019-06-12 DIAGNOSIS — E876 Hypokalemia: Secondary | ICD-10-CM | POA: Diagnosis not present

## 2019-06-12 DIAGNOSIS — D509 Iron deficiency anemia, unspecified: Secondary | ICD-10-CM | POA: Diagnosis not present

## 2019-06-14 DIAGNOSIS — D509 Iron deficiency anemia, unspecified: Secondary | ICD-10-CM | POA: Diagnosis not present

## 2019-06-14 DIAGNOSIS — N186 End stage renal disease: Secondary | ICD-10-CM | POA: Diagnosis not present

## 2019-06-14 DIAGNOSIS — N2581 Secondary hyperparathyroidism of renal origin: Secondary | ICD-10-CM | POA: Diagnosis not present

## 2019-06-14 DIAGNOSIS — E876 Hypokalemia: Secondary | ICD-10-CM | POA: Diagnosis not present

## 2019-06-14 DIAGNOSIS — D631 Anemia in chronic kidney disease: Secondary | ICD-10-CM | POA: Diagnosis not present

## 2019-06-14 DIAGNOSIS — Z992 Dependence on renal dialysis: Secondary | ICD-10-CM | POA: Diagnosis not present

## 2019-06-17 DIAGNOSIS — N186 End stage renal disease: Secondary | ICD-10-CM | POA: Diagnosis not present

## 2019-06-17 DIAGNOSIS — N2581 Secondary hyperparathyroidism of renal origin: Secondary | ICD-10-CM | POA: Diagnosis not present

## 2019-06-17 DIAGNOSIS — D631 Anemia in chronic kidney disease: Secondary | ICD-10-CM | POA: Diagnosis not present

## 2019-06-17 DIAGNOSIS — E876 Hypokalemia: Secondary | ICD-10-CM | POA: Diagnosis not present

## 2019-06-17 DIAGNOSIS — D509 Iron deficiency anemia, unspecified: Secondary | ICD-10-CM | POA: Diagnosis not present

## 2019-06-17 DIAGNOSIS — Z992 Dependence on renal dialysis: Secondary | ICD-10-CM | POA: Diagnosis not present

## 2019-06-18 DIAGNOSIS — Z992 Dependence on renal dialysis: Secondary | ICD-10-CM | POA: Diagnosis not present

## 2019-06-18 DIAGNOSIS — I129 Hypertensive chronic kidney disease with stage 1 through stage 4 chronic kidney disease, or unspecified chronic kidney disease: Secondary | ICD-10-CM | POA: Diagnosis not present

## 2019-06-18 DIAGNOSIS — N186 End stage renal disease: Secondary | ICD-10-CM | POA: Diagnosis not present

## 2019-06-19 DIAGNOSIS — E876 Hypokalemia: Secondary | ICD-10-CM | POA: Diagnosis not present

## 2019-06-19 DIAGNOSIS — N2581 Secondary hyperparathyroidism of renal origin: Secondary | ICD-10-CM | POA: Diagnosis not present

## 2019-06-19 DIAGNOSIS — Z992 Dependence on renal dialysis: Secondary | ICD-10-CM | POA: Diagnosis not present

## 2019-06-19 DIAGNOSIS — D631 Anemia in chronic kidney disease: Secondary | ICD-10-CM | POA: Diagnosis not present

## 2019-06-19 DIAGNOSIS — D509 Iron deficiency anemia, unspecified: Secondary | ICD-10-CM | POA: Diagnosis not present

## 2019-06-19 DIAGNOSIS — N186 End stage renal disease: Secondary | ICD-10-CM | POA: Diagnosis not present

## 2019-06-21 DIAGNOSIS — N186 End stage renal disease: Secondary | ICD-10-CM | POA: Diagnosis not present

## 2019-06-21 DIAGNOSIS — N2581 Secondary hyperparathyroidism of renal origin: Secondary | ICD-10-CM | POA: Diagnosis not present

## 2019-06-21 DIAGNOSIS — D509 Iron deficiency anemia, unspecified: Secondary | ICD-10-CM | POA: Diagnosis not present

## 2019-06-21 DIAGNOSIS — D631 Anemia in chronic kidney disease: Secondary | ICD-10-CM | POA: Diagnosis not present

## 2019-06-21 DIAGNOSIS — Z992 Dependence on renal dialysis: Secondary | ICD-10-CM | POA: Diagnosis not present

## 2019-06-21 DIAGNOSIS — E876 Hypokalemia: Secondary | ICD-10-CM | POA: Diagnosis not present

## 2019-06-24 DIAGNOSIS — Z992 Dependence on renal dialysis: Secondary | ICD-10-CM | POA: Diagnosis not present

## 2019-06-24 DIAGNOSIS — E876 Hypokalemia: Secondary | ICD-10-CM | POA: Diagnosis not present

## 2019-06-24 DIAGNOSIS — D631 Anemia in chronic kidney disease: Secondary | ICD-10-CM | POA: Diagnosis not present

## 2019-06-24 DIAGNOSIS — D509 Iron deficiency anemia, unspecified: Secondary | ICD-10-CM | POA: Diagnosis not present

## 2019-06-24 DIAGNOSIS — N186 End stage renal disease: Secondary | ICD-10-CM | POA: Diagnosis not present

## 2019-06-24 DIAGNOSIS — N2581 Secondary hyperparathyroidism of renal origin: Secondary | ICD-10-CM | POA: Diagnosis not present

## 2019-06-26 DIAGNOSIS — E876 Hypokalemia: Secondary | ICD-10-CM | POA: Diagnosis not present

## 2019-06-26 DIAGNOSIS — D509 Iron deficiency anemia, unspecified: Secondary | ICD-10-CM | POA: Diagnosis not present

## 2019-06-26 DIAGNOSIS — Z992 Dependence on renal dialysis: Secondary | ICD-10-CM | POA: Diagnosis not present

## 2019-06-26 DIAGNOSIS — N186 End stage renal disease: Secondary | ICD-10-CM | POA: Diagnosis not present

## 2019-06-26 DIAGNOSIS — D631 Anemia in chronic kidney disease: Secondary | ICD-10-CM | POA: Diagnosis not present

## 2019-06-26 DIAGNOSIS — N2581 Secondary hyperparathyroidism of renal origin: Secondary | ICD-10-CM | POA: Diagnosis not present

## 2019-06-27 ENCOUNTER — Other Ambulatory Visit: Payer: Self-pay | Admitting: Internal Medicine

## 2019-06-27 DIAGNOSIS — Z1231 Encounter for screening mammogram for malignant neoplasm of breast: Secondary | ICD-10-CM

## 2019-06-27 NOTE — Telephone Encounter (Signed)
Done erx 

## 2019-06-28 DIAGNOSIS — N186 End stage renal disease: Secondary | ICD-10-CM | POA: Diagnosis not present

## 2019-06-28 DIAGNOSIS — N2581 Secondary hyperparathyroidism of renal origin: Secondary | ICD-10-CM | POA: Diagnosis not present

## 2019-06-28 DIAGNOSIS — Z992 Dependence on renal dialysis: Secondary | ICD-10-CM | POA: Diagnosis not present

## 2019-06-28 DIAGNOSIS — E876 Hypokalemia: Secondary | ICD-10-CM | POA: Diagnosis not present

## 2019-06-28 DIAGNOSIS — D509 Iron deficiency anemia, unspecified: Secondary | ICD-10-CM | POA: Diagnosis not present

## 2019-06-28 DIAGNOSIS — D631 Anemia in chronic kidney disease: Secondary | ICD-10-CM | POA: Diagnosis not present

## 2019-07-01 DIAGNOSIS — D631 Anemia in chronic kidney disease: Secondary | ICD-10-CM | POA: Diagnosis not present

## 2019-07-01 DIAGNOSIS — Z992 Dependence on renal dialysis: Secondary | ICD-10-CM | POA: Diagnosis not present

## 2019-07-01 DIAGNOSIS — N2581 Secondary hyperparathyroidism of renal origin: Secondary | ICD-10-CM | POA: Diagnosis not present

## 2019-07-01 DIAGNOSIS — E876 Hypokalemia: Secondary | ICD-10-CM | POA: Diagnosis not present

## 2019-07-01 DIAGNOSIS — N186 End stage renal disease: Secondary | ICD-10-CM | POA: Diagnosis not present

## 2019-07-01 DIAGNOSIS — D509 Iron deficiency anemia, unspecified: Secondary | ICD-10-CM | POA: Diagnosis not present

## 2019-07-03 DIAGNOSIS — N186 End stage renal disease: Secondary | ICD-10-CM | POA: Diagnosis not present

## 2019-07-03 DIAGNOSIS — E876 Hypokalemia: Secondary | ICD-10-CM | POA: Diagnosis not present

## 2019-07-03 DIAGNOSIS — D631 Anemia in chronic kidney disease: Secondary | ICD-10-CM | POA: Diagnosis not present

## 2019-07-03 DIAGNOSIS — D509 Iron deficiency anemia, unspecified: Secondary | ICD-10-CM | POA: Diagnosis not present

## 2019-07-03 DIAGNOSIS — N2581 Secondary hyperparathyroidism of renal origin: Secondary | ICD-10-CM | POA: Diagnosis not present

## 2019-07-03 DIAGNOSIS — Z992 Dependence on renal dialysis: Secondary | ICD-10-CM | POA: Diagnosis not present

## 2019-07-05 DIAGNOSIS — D509 Iron deficiency anemia, unspecified: Secondary | ICD-10-CM | POA: Diagnosis not present

## 2019-07-05 DIAGNOSIS — Z992 Dependence on renal dialysis: Secondary | ICD-10-CM | POA: Diagnosis not present

## 2019-07-05 DIAGNOSIS — E876 Hypokalemia: Secondary | ICD-10-CM | POA: Diagnosis not present

## 2019-07-05 DIAGNOSIS — D631 Anemia in chronic kidney disease: Secondary | ICD-10-CM | POA: Diagnosis not present

## 2019-07-05 DIAGNOSIS — N186 End stage renal disease: Secondary | ICD-10-CM | POA: Diagnosis not present

## 2019-07-05 DIAGNOSIS — N2581 Secondary hyperparathyroidism of renal origin: Secondary | ICD-10-CM | POA: Diagnosis not present

## 2019-07-08 DIAGNOSIS — D631 Anemia in chronic kidney disease: Secondary | ICD-10-CM | POA: Diagnosis not present

## 2019-07-08 DIAGNOSIS — Z992 Dependence on renal dialysis: Secondary | ICD-10-CM | POA: Diagnosis not present

## 2019-07-08 DIAGNOSIS — D509 Iron deficiency anemia, unspecified: Secondary | ICD-10-CM | POA: Diagnosis not present

## 2019-07-08 DIAGNOSIS — N2581 Secondary hyperparathyroidism of renal origin: Secondary | ICD-10-CM | POA: Diagnosis not present

## 2019-07-08 DIAGNOSIS — N186 End stage renal disease: Secondary | ICD-10-CM | POA: Diagnosis not present

## 2019-07-08 DIAGNOSIS — E876 Hypokalemia: Secondary | ICD-10-CM | POA: Diagnosis not present

## 2019-07-10 DIAGNOSIS — N2581 Secondary hyperparathyroidism of renal origin: Secondary | ICD-10-CM | POA: Diagnosis not present

## 2019-07-10 DIAGNOSIS — E876 Hypokalemia: Secondary | ICD-10-CM | POA: Diagnosis not present

## 2019-07-10 DIAGNOSIS — Z992 Dependence on renal dialysis: Secondary | ICD-10-CM | POA: Diagnosis not present

## 2019-07-10 DIAGNOSIS — D631 Anemia in chronic kidney disease: Secondary | ICD-10-CM | POA: Diagnosis not present

## 2019-07-10 DIAGNOSIS — N186 End stage renal disease: Secondary | ICD-10-CM | POA: Diagnosis not present

## 2019-07-10 DIAGNOSIS — D509 Iron deficiency anemia, unspecified: Secondary | ICD-10-CM | POA: Diagnosis not present

## 2019-07-12 DIAGNOSIS — N186 End stage renal disease: Secondary | ICD-10-CM | POA: Diagnosis not present

## 2019-07-12 DIAGNOSIS — D509 Iron deficiency anemia, unspecified: Secondary | ICD-10-CM | POA: Diagnosis not present

## 2019-07-12 DIAGNOSIS — E876 Hypokalemia: Secondary | ICD-10-CM | POA: Diagnosis not present

## 2019-07-12 DIAGNOSIS — N2581 Secondary hyperparathyroidism of renal origin: Secondary | ICD-10-CM | POA: Diagnosis not present

## 2019-07-12 DIAGNOSIS — D631 Anemia in chronic kidney disease: Secondary | ICD-10-CM | POA: Diagnosis not present

## 2019-07-12 DIAGNOSIS — Z992 Dependence on renal dialysis: Secondary | ICD-10-CM | POA: Diagnosis not present

## 2019-07-15 DIAGNOSIS — N186 End stage renal disease: Secondary | ICD-10-CM | POA: Diagnosis not present

## 2019-07-15 DIAGNOSIS — D509 Iron deficiency anemia, unspecified: Secondary | ICD-10-CM | POA: Diagnosis not present

## 2019-07-15 DIAGNOSIS — D631 Anemia in chronic kidney disease: Secondary | ICD-10-CM | POA: Diagnosis not present

## 2019-07-15 DIAGNOSIS — E876 Hypokalemia: Secondary | ICD-10-CM | POA: Diagnosis not present

## 2019-07-15 DIAGNOSIS — Z992 Dependence on renal dialysis: Secondary | ICD-10-CM | POA: Diagnosis not present

## 2019-07-15 DIAGNOSIS — N2581 Secondary hyperparathyroidism of renal origin: Secondary | ICD-10-CM | POA: Diagnosis not present

## 2019-07-17 DIAGNOSIS — D631 Anemia in chronic kidney disease: Secondary | ICD-10-CM | POA: Diagnosis not present

## 2019-07-17 DIAGNOSIS — D509 Iron deficiency anemia, unspecified: Secondary | ICD-10-CM | POA: Diagnosis not present

## 2019-07-17 DIAGNOSIS — E876 Hypokalemia: Secondary | ICD-10-CM | POA: Diagnosis not present

## 2019-07-17 DIAGNOSIS — Z992 Dependence on renal dialysis: Secondary | ICD-10-CM | POA: Diagnosis not present

## 2019-07-17 DIAGNOSIS — N2581 Secondary hyperparathyroidism of renal origin: Secondary | ICD-10-CM | POA: Diagnosis not present

## 2019-07-17 DIAGNOSIS — N186 End stage renal disease: Secondary | ICD-10-CM | POA: Diagnosis not present

## 2019-07-19 DIAGNOSIS — N186 End stage renal disease: Secondary | ICD-10-CM | POA: Diagnosis not present

## 2019-07-19 DIAGNOSIS — Z992 Dependence on renal dialysis: Secondary | ICD-10-CM | POA: Diagnosis not present

## 2019-07-19 DIAGNOSIS — N2581 Secondary hyperparathyroidism of renal origin: Secondary | ICD-10-CM | POA: Diagnosis not present

## 2019-07-19 DIAGNOSIS — I129 Hypertensive chronic kidney disease with stage 1 through stage 4 chronic kidney disease, or unspecified chronic kidney disease: Secondary | ICD-10-CM | POA: Diagnosis not present

## 2019-07-19 DIAGNOSIS — D631 Anemia in chronic kidney disease: Secondary | ICD-10-CM | POA: Diagnosis not present

## 2019-07-19 DIAGNOSIS — Z23 Encounter for immunization: Secondary | ICD-10-CM | POA: Diagnosis not present

## 2019-07-19 DIAGNOSIS — E876 Hypokalemia: Secondary | ICD-10-CM | POA: Diagnosis not present

## 2019-07-21 ENCOUNTER — Other Ambulatory Visit: Payer: Self-pay | Admitting: Internal Medicine

## 2019-07-22 DIAGNOSIS — E876 Hypokalemia: Secondary | ICD-10-CM | POA: Diagnosis not present

## 2019-07-22 DIAGNOSIS — Z992 Dependence on renal dialysis: Secondary | ICD-10-CM | POA: Diagnosis not present

## 2019-07-22 DIAGNOSIS — N186 End stage renal disease: Secondary | ICD-10-CM | POA: Diagnosis not present

## 2019-07-22 DIAGNOSIS — N2581 Secondary hyperparathyroidism of renal origin: Secondary | ICD-10-CM | POA: Diagnosis not present

## 2019-07-22 DIAGNOSIS — D631 Anemia in chronic kidney disease: Secondary | ICD-10-CM | POA: Diagnosis not present

## 2019-07-22 DIAGNOSIS — Z23 Encounter for immunization: Secondary | ICD-10-CM | POA: Diagnosis not present

## 2019-07-24 DIAGNOSIS — Z23 Encounter for immunization: Secondary | ICD-10-CM | POA: Diagnosis not present

## 2019-07-24 DIAGNOSIS — E876 Hypokalemia: Secondary | ICD-10-CM | POA: Diagnosis not present

## 2019-07-24 DIAGNOSIS — D631 Anemia in chronic kidney disease: Secondary | ICD-10-CM | POA: Diagnosis not present

## 2019-07-24 DIAGNOSIS — Z992 Dependence on renal dialysis: Secondary | ICD-10-CM | POA: Diagnosis not present

## 2019-07-24 DIAGNOSIS — N186 End stage renal disease: Secondary | ICD-10-CM | POA: Diagnosis not present

## 2019-07-24 DIAGNOSIS — N2581 Secondary hyperparathyroidism of renal origin: Secondary | ICD-10-CM | POA: Diagnosis not present

## 2019-07-26 ENCOUNTER — Other Ambulatory Visit: Payer: Self-pay | Admitting: Internal Medicine

## 2019-07-26 DIAGNOSIS — N186 End stage renal disease: Secondary | ICD-10-CM | POA: Diagnosis not present

## 2019-07-26 DIAGNOSIS — D631 Anemia in chronic kidney disease: Secondary | ICD-10-CM | POA: Diagnosis not present

## 2019-07-26 DIAGNOSIS — Z23 Encounter for immunization: Secondary | ICD-10-CM | POA: Diagnosis not present

## 2019-07-26 DIAGNOSIS — E876 Hypokalemia: Secondary | ICD-10-CM | POA: Diagnosis not present

## 2019-07-26 DIAGNOSIS — Z992 Dependence on renal dialysis: Secondary | ICD-10-CM | POA: Diagnosis not present

## 2019-07-26 DIAGNOSIS — N2581 Secondary hyperparathyroidism of renal origin: Secondary | ICD-10-CM | POA: Diagnosis not present

## 2019-07-28 ENCOUNTER — Ambulatory Visit
Admission: RE | Admit: 2019-07-28 | Discharge: 2019-07-28 | Disposition: A | Discharge status: Home / Self Care | Payer: Medicare Other | Source: Ambulatory Visit | Attending: Internal Medicine | Admitting: Internal Medicine

## 2019-07-28 ENCOUNTER — Other Ambulatory Visit: Payer: Self-pay

## 2019-07-28 DIAGNOSIS — Z1231 Encounter for screening mammogram for malignant neoplasm of breast: Secondary | ICD-10-CM | POA: Diagnosis not present

## 2019-07-29 DIAGNOSIS — D631 Anemia in chronic kidney disease: Secondary | ICD-10-CM | POA: Diagnosis not present

## 2019-07-29 DIAGNOSIS — Z992 Dependence on renal dialysis: Secondary | ICD-10-CM | POA: Diagnosis not present

## 2019-07-29 DIAGNOSIS — N186 End stage renal disease: Secondary | ICD-10-CM | POA: Diagnosis not present

## 2019-07-29 DIAGNOSIS — Z23 Encounter for immunization: Secondary | ICD-10-CM | POA: Diagnosis not present

## 2019-07-29 DIAGNOSIS — E876 Hypokalemia: Secondary | ICD-10-CM | POA: Diagnosis not present

## 2019-07-29 DIAGNOSIS — N2581 Secondary hyperparathyroidism of renal origin: Secondary | ICD-10-CM | POA: Diagnosis not present

## 2019-07-31 DIAGNOSIS — N2581 Secondary hyperparathyroidism of renal origin: Secondary | ICD-10-CM | POA: Diagnosis not present

## 2019-07-31 DIAGNOSIS — N186 End stage renal disease: Secondary | ICD-10-CM | POA: Diagnosis not present

## 2019-07-31 DIAGNOSIS — Z23 Encounter for immunization: Secondary | ICD-10-CM | POA: Diagnosis not present

## 2019-07-31 DIAGNOSIS — E876 Hypokalemia: Secondary | ICD-10-CM | POA: Diagnosis not present

## 2019-07-31 DIAGNOSIS — Z992 Dependence on renal dialysis: Secondary | ICD-10-CM | POA: Diagnosis not present

## 2019-07-31 DIAGNOSIS — D631 Anemia in chronic kidney disease: Secondary | ICD-10-CM | POA: Diagnosis not present

## 2019-08-02 DIAGNOSIS — N2581 Secondary hyperparathyroidism of renal origin: Secondary | ICD-10-CM | POA: Diagnosis not present

## 2019-08-02 DIAGNOSIS — N186 End stage renal disease: Secondary | ICD-10-CM | POA: Diagnosis not present

## 2019-08-02 DIAGNOSIS — Z23 Encounter for immunization: Secondary | ICD-10-CM | POA: Diagnosis not present

## 2019-08-02 DIAGNOSIS — E876 Hypokalemia: Secondary | ICD-10-CM | POA: Diagnosis not present

## 2019-08-02 DIAGNOSIS — D631 Anemia in chronic kidney disease: Secondary | ICD-10-CM | POA: Diagnosis not present

## 2019-08-02 DIAGNOSIS — Z992 Dependence on renal dialysis: Secondary | ICD-10-CM | POA: Diagnosis not present

## 2019-08-05 DIAGNOSIS — N2581 Secondary hyperparathyroidism of renal origin: Secondary | ICD-10-CM | POA: Diagnosis not present

## 2019-08-05 DIAGNOSIS — Z23 Encounter for immunization: Secondary | ICD-10-CM | POA: Diagnosis not present

## 2019-08-05 DIAGNOSIS — N186 End stage renal disease: Secondary | ICD-10-CM | POA: Diagnosis not present

## 2019-08-05 DIAGNOSIS — Z992 Dependence on renal dialysis: Secondary | ICD-10-CM | POA: Diagnosis not present

## 2019-08-05 DIAGNOSIS — E876 Hypokalemia: Secondary | ICD-10-CM | POA: Diagnosis not present

## 2019-08-05 DIAGNOSIS — D631 Anemia in chronic kidney disease: Secondary | ICD-10-CM | POA: Diagnosis not present

## 2019-08-07 DIAGNOSIS — Z992 Dependence on renal dialysis: Secondary | ICD-10-CM | POA: Diagnosis not present

## 2019-08-07 DIAGNOSIS — N2581 Secondary hyperparathyroidism of renal origin: Secondary | ICD-10-CM | POA: Diagnosis not present

## 2019-08-07 DIAGNOSIS — N186 End stage renal disease: Secondary | ICD-10-CM | POA: Diagnosis not present

## 2019-08-07 DIAGNOSIS — Z23 Encounter for immunization: Secondary | ICD-10-CM | POA: Diagnosis not present

## 2019-08-07 DIAGNOSIS — D631 Anemia in chronic kidney disease: Secondary | ICD-10-CM | POA: Diagnosis not present

## 2019-08-07 DIAGNOSIS — E876 Hypokalemia: Secondary | ICD-10-CM | POA: Diagnosis not present

## 2019-08-09 DIAGNOSIS — Z992 Dependence on renal dialysis: Secondary | ICD-10-CM | POA: Diagnosis not present

## 2019-08-09 DIAGNOSIS — N2581 Secondary hyperparathyroidism of renal origin: Secondary | ICD-10-CM | POA: Diagnosis not present

## 2019-08-09 DIAGNOSIS — Z23 Encounter for immunization: Secondary | ICD-10-CM | POA: Diagnosis not present

## 2019-08-09 DIAGNOSIS — E876 Hypokalemia: Secondary | ICD-10-CM | POA: Diagnosis not present

## 2019-08-09 DIAGNOSIS — N186 End stage renal disease: Secondary | ICD-10-CM | POA: Diagnosis not present

## 2019-08-09 DIAGNOSIS — D631 Anemia in chronic kidney disease: Secondary | ICD-10-CM | POA: Diagnosis not present

## 2019-08-12 DIAGNOSIS — E876 Hypokalemia: Secondary | ICD-10-CM | POA: Diagnosis not present

## 2019-08-12 DIAGNOSIS — N186 End stage renal disease: Secondary | ICD-10-CM | POA: Diagnosis not present

## 2019-08-12 DIAGNOSIS — N2581 Secondary hyperparathyroidism of renal origin: Secondary | ICD-10-CM | POA: Diagnosis not present

## 2019-08-12 DIAGNOSIS — Z23 Encounter for immunization: Secondary | ICD-10-CM | POA: Diagnosis not present

## 2019-08-12 DIAGNOSIS — Z992 Dependence on renal dialysis: Secondary | ICD-10-CM | POA: Diagnosis not present

## 2019-08-12 DIAGNOSIS — D631 Anemia in chronic kidney disease: Secondary | ICD-10-CM | POA: Diagnosis not present

## 2019-08-14 DIAGNOSIS — D631 Anemia in chronic kidney disease: Secondary | ICD-10-CM | POA: Diagnosis not present

## 2019-08-14 DIAGNOSIS — N2581 Secondary hyperparathyroidism of renal origin: Secondary | ICD-10-CM | POA: Diagnosis not present

## 2019-08-14 DIAGNOSIS — E876 Hypokalemia: Secondary | ICD-10-CM | POA: Diagnosis not present

## 2019-08-14 DIAGNOSIS — Z23 Encounter for immunization: Secondary | ICD-10-CM | POA: Diagnosis not present

## 2019-08-14 DIAGNOSIS — Z992 Dependence on renal dialysis: Secondary | ICD-10-CM | POA: Diagnosis not present

## 2019-08-14 DIAGNOSIS — N186 End stage renal disease: Secondary | ICD-10-CM | POA: Diagnosis not present

## 2019-08-16 DIAGNOSIS — N186 End stage renal disease: Secondary | ICD-10-CM | POA: Diagnosis not present

## 2019-08-16 DIAGNOSIS — Z23 Encounter for immunization: Secondary | ICD-10-CM | POA: Diagnosis not present

## 2019-08-16 DIAGNOSIS — D631 Anemia in chronic kidney disease: Secondary | ICD-10-CM | POA: Diagnosis not present

## 2019-08-16 DIAGNOSIS — E876 Hypokalemia: Secondary | ICD-10-CM | POA: Diagnosis not present

## 2019-08-16 DIAGNOSIS — N2581 Secondary hyperparathyroidism of renal origin: Secondary | ICD-10-CM | POA: Diagnosis not present

## 2019-08-16 DIAGNOSIS — Z992 Dependence on renal dialysis: Secondary | ICD-10-CM | POA: Diagnosis not present

## 2019-08-19 DIAGNOSIS — E876 Hypokalemia: Secondary | ICD-10-CM | POA: Diagnosis not present

## 2019-08-19 DIAGNOSIS — N2581 Secondary hyperparathyroidism of renal origin: Secondary | ICD-10-CM | POA: Diagnosis not present

## 2019-08-19 DIAGNOSIS — N186 End stage renal disease: Secondary | ICD-10-CM | POA: Diagnosis not present

## 2019-08-19 DIAGNOSIS — I129 Hypertensive chronic kidney disease with stage 1 through stage 4 chronic kidney disease, or unspecified chronic kidney disease: Secondary | ICD-10-CM | POA: Diagnosis not present

## 2019-08-19 DIAGNOSIS — Z23 Encounter for immunization: Secondary | ICD-10-CM | POA: Diagnosis not present

## 2019-08-19 DIAGNOSIS — D631 Anemia in chronic kidney disease: Secondary | ICD-10-CM | POA: Diagnosis not present

## 2019-08-19 DIAGNOSIS — Z992 Dependence on renal dialysis: Secondary | ICD-10-CM | POA: Diagnosis not present

## 2019-08-21 DIAGNOSIS — Z992 Dependence on renal dialysis: Secondary | ICD-10-CM | POA: Diagnosis not present

## 2019-08-21 DIAGNOSIS — N186 End stage renal disease: Secondary | ICD-10-CM | POA: Diagnosis not present

## 2019-08-21 DIAGNOSIS — N2581 Secondary hyperparathyroidism of renal origin: Secondary | ICD-10-CM | POA: Diagnosis not present

## 2019-08-21 DIAGNOSIS — Z23 Encounter for immunization: Secondary | ICD-10-CM | POA: Diagnosis not present

## 2019-08-21 DIAGNOSIS — E876 Hypokalemia: Secondary | ICD-10-CM | POA: Diagnosis not present

## 2019-08-21 DIAGNOSIS — D631 Anemia in chronic kidney disease: Secondary | ICD-10-CM | POA: Diagnosis not present

## 2019-08-23 DIAGNOSIS — N2581 Secondary hyperparathyroidism of renal origin: Secondary | ICD-10-CM | POA: Diagnosis not present

## 2019-08-23 DIAGNOSIS — Z992 Dependence on renal dialysis: Secondary | ICD-10-CM | POA: Diagnosis not present

## 2019-08-23 DIAGNOSIS — Z23 Encounter for immunization: Secondary | ICD-10-CM | POA: Diagnosis not present

## 2019-08-23 DIAGNOSIS — E876 Hypokalemia: Secondary | ICD-10-CM | POA: Diagnosis not present

## 2019-08-23 DIAGNOSIS — N186 End stage renal disease: Secondary | ICD-10-CM | POA: Diagnosis not present

## 2019-08-23 DIAGNOSIS — D631 Anemia in chronic kidney disease: Secondary | ICD-10-CM | POA: Diagnosis not present

## 2019-08-26 DIAGNOSIS — N2581 Secondary hyperparathyroidism of renal origin: Secondary | ICD-10-CM | POA: Diagnosis not present

## 2019-08-26 DIAGNOSIS — Z992 Dependence on renal dialysis: Secondary | ICD-10-CM | POA: Diagnosis not present

## 2019-08-26 DIAGNOSIS — D631 Anemia in chronic kidney disease: Secondary | ICD-10-CM | POA: Diagnosis not present

## 2019-08-26 DIAGNOSIS — N186 End stage renal disease: Secondary | ICD-10-CM | POA: Diagnosis not present

## 2019-08-26 DIAGNOSIS — Z23 Encounter for immunization: Secondary | ICD-10-CM | POA: Diagnosis not present

## 2019-08-26 DIAGNOSIS — E876 Hypokalemia: Secondary | ICD-10-CM | POA: Diagnosis not present

## 2019-08-28 DIAGNOSIS — N186 End stage renal disease: Secondary | ICD-10-CM | POA: Diagnosis not present

## 2019-08-28 DIAGNOSIS — Z23 Encounter for immunization: Secondary | ICD-10-CM | POA: Diagnosis not present

## 2019-08-28 DIAGNOSIS — Z992 Dependence on renal dialysis: Secondary | ICD-10-CM | POA: Diagnosis not present

## 2019-08-28 DIAGNOSIS — E876 Hypokalemia: Secondary | ICD-10-CM | POA: Diagnosis not present

## 2019-08-28 DIAGNOSIS — D631 Anemia in chronic kidney disease: Secondary | ICD-10-CM | POA: Diagnosis not present

## 2019-08-28 DIAGNOSIS — N2581 Secondary hyperparathyroidism of renal origin: Secondary | ICD-10-CM | POA: Diagnosis not present

## 2019-08-30 DIAGNOSIS — N186 End stage renal disease: Secondary | ICD-10-CM | POA: Diagnosis not present

## 2019-08-30 DIAGNOSIS — Z992 Dependence on renal dialysis: Secondary | ICD-10-CM | POA: Diagnosis not present

## 2019-08-30 DIAGNOSIS — E876 Hypokalemia: Secondary | ICD-10-CM | POA: Diagnosis not present

## 2019-08-30 DIAGNOSIS — D631 Anemia in chronic kidney disease: Secondary | ICD-10-CM | POA: Diagnosis not present

## 2019-08-30 DIAGNOSIS — Z23 Encounter for immunization: Secondary | ICD-10-CM | POA: Diagnosis not present

## 2019-08-30 DIAGNOSIS — N2581 Secondary hyperparathyroidism of renal origin: Secondary | ICD-10-CM | POA: Diagnosis not present

## 2019-09-02 DIAGNOSIS — Z23 Encounter for immunization: Secondary | ICD-10-CM | POA: Diagnosis not present

## 2019-09-02 DIAGNOSIS — N2581 Secondary hyperparathyroidism of renal origin: Secondary | ICD-10-CM | POA: Diagnosis not present

## 2019-09-02 DIAGNOSIS — Z992 Dependence on renal dialysis: Secondary | ICD-10-CM | POA: Diagnosis not present

## 2019-09-02 DIAGNOSIS — D631 Anemia in chronic kidney disease: Secondary | ICD-10-CM | POA: Diagnosis not present

## 2019-09-02 DIAGNOSIS — E876 Hypokalemia: Secondary | ICD-10-CM | POA: Diagnosis not present

## 2019-09-02 DIAGNOSIS — N186 End stage renal disease: Secondary | ICD-10-CM | POA: Diagnosis not present

## 2019-09-04 DIAGNOSIS — N2581 Secondary hyperparathyroidism of renal origin: Secondary | ICD-10-CM | POA: Diagnosis not present

## 2019-09-04 DIAGNOSIS — N186 End stage renal disease: Secondary | ICD-10-CM | POA: Diagnosis not present

## 2019-09-04 DIAGNOSIS — Z23 Encounter for immunization: Secondary | ICD-10-CM | POA: Diagnosis not present

## 2019-09-04 DIAGNOSIS — Z992 Dependence on renal dialysis: Secondary | ICD-10-CM | POA: Diagnosis not present

## 2019-09-04 DIAGNOSIS — E876 Hypokalemia: Secondary | ICD-10-CM | POA: Diagnosis not present

## 2019-09-04 DIAGNOSIS — D631 Anemia in chronic kidney disease: Secondary | ICD-10-CM | POA: Diagnosis not present

## 2019-09-06 DIAGNOSIS — N2581 Secondary hyperparathyroidism of renal origin: Secondary | ICD-10-CM | POA: Diagnosis not present

## 2019-09-06 DIAGNOSIS — Z23 Encounter for immunization: Secondary | ICD-10-CM | POA: Diagnosis not present

## 2019-09-06 DIAGNOSIS — E876 Hypokalemia: Secondary | ICD-10-CM | POA: Diagnosis not present

## 2019-09-06 DIAGNOSIS — N186 End stage renal disease: Secondary | ICD-10-CM | POA: Diagnosis not present

## 2019-09-06 DIAGNOSIS — Z992 Dependence on renal dialysis: Secondary | ICD-10-CM | POA: Diagnosis not present

## 2019-09-06 DIAGNOSIS — D631 Anemia in chronic kidney disease: Secondary | ICD-10-CM | POA: Diagnosis not present

## 2019-09-09 DIAGNOSIS — Z23 Encounter for immunization: Secondary | ICD-10-CM | POA: Diagnosis not present

## 2019-09-09 DIAGNOSIS — Z992 Dependence on renal dialysis: Secondary | ICD-10-CM | POA: Diagnosis not present

## 2019-09-09 DIAGNOSIS — D631 Anemia in chronic kidney disease: Secondary | ICD-10-CM | POA: Diagnosis not present

## 2019-09-09 DIAGNOSIS — N186 End stage renal disease: Secondary | ICD-10-CM | POA: Diagnosis not present

## 2019-09-09 DIAGNOSIS — E876 Hypokalemia: Secondary | ICD-10-CM | POA: Diagnosis not present

## 2019-09-09 DIAGNOSIS — N2581 Secondary hyperparathyroidism of renal origin: Secondary | ICD-10-CM | POA: Diagnosis not present

## 2019-09-11 DIAGNOSIS — Z992 Dependence on renal dialysis: Secondary | ICD-10-CM | POA: Diagnosis not present

## 2019-09-11 DIAGNOSIS — N186 End stage renal disease: Secondary | ICD-10-CM | POA: Diagnosis not present

## 2019-09-11 DIAGNOSIS — D631 Anemia in chronic kidney disease: Secondary | ICD-10-CM | POA: Diagnosis not present

## 2019-09-11 DIAGNOSIS — N2581 Secondary hyperparathyroidism of renal origin: Secondary | ICD-10-CM | POA: Diagnosis not present

## 2019-09-11 DIAGNOSIS — Z23 Encounter for immunization: Secondary | ICD-10-CM | POA: Diagnosis not present

## 2019-09-11 DIAGNOSIS — E876 Hypokalemia: Secondary | ICD-10-CM | POA: Diagnosis not present

## 2019-09-13 DIAGNOSIS — N2581 Secondary hyperparathyroidism of renal origin: Secondary | ICD-10-CM | POA: Diagnosis not present

## 2019-09-13 DIAGNOSIS — N186 End stage renal disease: Secondary | ICD-10-CM | POA: Diagnosis not present

## 2019-09-13 DIAGNOSIS — Z992 Dependence on renal dialysis: Secondary | ICD-10-CM | POA: Diagnosis not present

## 2019-09-13 DIAGNOSIS — D631 Anemia in chronic kidney disease: Secondary | ICD-10-CM | POA: Diagnosis not present

## 2019-09-13 DIAGNOSIS — Z23 Encounter for immunization: Secondary | ICD-10-CM | POA: Diagnosis not present

## 2019-09-13 DIAGNOSIS — E876 Hypokalemia: Secondary | ICD-10-CM | POA: Diagnosis not present

## 2019-09-16 DIAGNOSIS — N2581 Secondary hyperparathyroidism of renal origin: Secondary | ICD-10-CM | POA: Diagnosis not present

## 2019-09-16 DIAGNOSIS — E876 Hypokalemia: Secondary | ICD-10-CM | POA: Diagnosis not present

## 2019-09-16 DIAGNOSIS — N186 End stage renal disease: Secondary | ICD-10-CM | POA: Diagnosis not present

## 2019-09-16 DIAGNOSIS — Z23 Encounter for immunization: Secondary | ICD-10-CM | POA: Diagnosis not present

## 2019-09-16 DIAGNOSIS — D631 Anemia in chronic kidney disease: Secondary | ICD-10-CM | POA: Diagnosis not present

## 2019-09-16 DIAGNOSIS — Z992 Dependence on renal dialysis: Secondary | ICD-10-CM | POA: Diagnosis not present

## 2019-09-17 ENCOUNTER — Other Ambulatory Visit: Payer: Self-pay | Admitting: Interventional Radiology

## 2019-09-17 DIAGNOSIS — C220 Liver cell carcinoma: Secondary | ICD-10-CM

## 2019-09-18 DIAGNOSIS — Z992 Dependence on renal dialysis: Secondary | ICD-10-CM | POA: Diagnosis not present

## 2019-09-18 DIAGNOSIS — E876 Hypokalemia: Secondary | ICD-10-CM | POA: Diagnosis not present

## 2019-09-18 DIAGNOSIS — D631 Anemia in chronic kidney disease: Secondary | ICD-10-CM | POA: Diagnosis not present

## 2019-09-18 DIAGNOSIS — D509 Iron deficiency anemia, unspecified: Secondary | ICD-10-CM | POA: Diagnosis not present

## 2019-09-18 DIAGNOSIS — N2581 Secondary hyperparathyroidism of renal origin: Secondary | ICD-10-CM | POA: Diagnosis not present

## 2019-09-18 DIAGNOSIS — N186 End stage renal disease: Secondary | ICD-10-CM | POA: Diagnosis not present

## 2019-09-18 DIAGNOSIS — I129 Hypertensive chronic kidney disease with stage 1 through stage 4 chronic kidney disease, or unspecified chronic kidney disease: Secondary | ICD-10-CM | POA: Diagnosis not present

## 2019-09-20 DIAGNOSIS — D631 Anemia in chronic kidney disease: Secondary | ICD-10-CM | POA: Diagnosis not present

## 2019-09-20 DIAGNOSIS — E876 Hypokalemia: Secondary | ICD-10-CM | POA: Diagnosis not present

## 2019-09-20 DIAGNOSIS — N186 End stage renal disease: Secondary | ICD-10-CM | POA: Diagnosis not present

## 2019-09-20 DIAGNOSIS — D509 Iron deficiency anemia, unspecified: Secondary | ICD-10-CM | POA: Diagnosis not present

## 2019-09-20 DIAGNOSIS — N2581 Secondary hyperparathyroidism of renal origin: Secondary | ICD-10-CM | POA: Diagnosis not present

## 2019-09-20 DIAGNOSIS — Z992 Dependence on renal dialysis: Secondary | ICD-10-CM | POA: Diagnosis not present

## 2019-09-21 ENCOUNTER — Other Ambulatory Visit: Payer: Self-pay | Admitting: Internal Medicine

## 2019-09-23 DIAGNOSIS — E876 Hypokalemia: Secondary | ICD-10-CM | POA: Diagnosis not present

## 2019-09-23 DIAGNOSIS — N2581 Secondary hyperparathyroidism of renal origin: Secondary | ICD-10-CM | POA: Diagnosis not present

## 2019-09-23 DIAGNOSIS — D631 Anemia in chronic kidney disease: Secondary | ICD-10-CM | POA: Diagnosis not present

## 2019-09-23 DIAGNOSIS — Z992 Dependence on renal dialysis: Secondary | ICD-10-CM | POA: Diagnosis not present

## 2019-09-23 DIAGNOSIS — D509 Iron deficiency anemia, unspecified: Secondary | ICD-10-CM | POA: Diagnosis not present

## 2019-09-23 DIAGNOSIS — N186 End stage renal disease: Secondary | ICD-10-CM | POA: Diagnosis not present

## 2019-09-24 ENCOUNTER — Other Ambulatory Visit: Payer: Self-pay

## 2019-09-24 ENCOUNTER — Ambulatory Visit (HOSPITAL_COMMUNITY)
Admission: RE | Admit: 2019-09-24 | Discharge: 2019-09-24 | Disposition: A | Discharge status: Home / Self Care | Payer: Medicare Other | Source: Ambulatory Visit | Attending: Interventional Radiology | Admitting: Interventional Radiology

## 2019-09-24 DIAGNOSIS — C220 Liver cell carcinoma: Secondary | ICD-10-CM | POA: Diagnosis not present

## 2019-09-25 DIAGNOSIS — N186 End stage renal disease: Secondary | ICD-10-CM | POA: Diagnosis not present

## 2019-09-25 DIAGNOSIS — N2581 Secondary hyperparathyroidism of renal origin: Secondary | ICD-10-CM | POA: Diagnosis not present

## 2019-09-25 DIAGNOSIS — Z992 Dependence on renal dialysis: Secondary | ICD-10-CM | POA: Diagnosis not present

## 2019-09-25 DIAGNOSIS — D631 Anemia in chronic kidney disease: Secondary | ICD-10-CM | POA: Diagnosis not present

## 2019-09-25 DIAGNOSIS — D509 Iron deficiency anemia, unspecified: Secondary | ICD-10-CM | POA: Diagnosis not present

## 2019-09-25 DIAGNOSIS — E876 Hypokalemia: Secondary | ICD-10-CM | POA: Diagnosis not present

## 2019-09-27 DIAGNOSIS — N2581 Secondary hyperparathyroidism of renal origin: Secondary | ICD-10-CM | POA: Diagnosis not present

## 2019-09-27 DIAGNOSIS — D631 Anemia in chronic kidney disease: Secondary | ICD-10-CM | POA: Diagnosis not present

## 2019-09-27 DIAGNOSIS — Z992 Dependence on renal dialysis: Secondary | ICD-10-CM | POA: Diagnosis not present

## 2019-09-27 DIAGNOSIS — E876 Hypokalemia: Secondary | ICD-10-CM | POA: Diagnosis not present

## 2019-09-27 DIAGNOSIS — N186 End stage renal disease: Secondary | ICD-10-CM | POA: Diagnosis not present

## 2019-09-27 DIAGNOSIS — D509 Iron deficiency anemia, unspecified: Secondary | ICD-10-CM | POA: Diagnosis not present

## 2019-09-30 DIAGNOSIS — D631 Anemia in chronic kidney disease: Secondary | ICD-10-CM | POA: Diagnosis not present

## 2019-09-30 DIAGNOSIS — N186 End stage renal disease: Secondary | ICD-10-CM | POA: Diagnosis not present

## 2019-09-30 DIAGNOSIS — N2581 Secondary hyperparathyroidism of renal origin: Secondary | ICD-10-CM | POA: Diagnosis not present

## 2019-09-30 DIAGNOSIS — E876 Hypokalemia: Secondary | ICD-10-CM | POA: Diagnosis not present

## 2019-09-30 DIAGNOSIS — D509 Iron deficiency anemia, unspecified: Secondary | ICD-10-CM | POA: Diagnosis not present

## 2019-09-30 DIAGNOSIS — Z992 Dependence on renal dialysis: Secondary | ICD-10-CM | POA: Diagnosis not present

## 2019-10-02 DIAGNOSIS — N186 End stage renal disease: Secondary | ICD-10-CM | POA: Diagnosis not present

## 2019-10-02 DIAGNOSIS — Z992 Dependence on renal dialysis: Secondary | ICD-10-CM | POA: Diagnosis not present

## 2019-10-02 DIAGNOSIS — N2581 Secondary hyperparathyroidism of renal origin: Secondary | ICD-10-CM | POA: Diagnosis not present

## 2019-10-02 DIAGNOSIS — D509 Iron deficiency anemia, unspecified: Secondary | ICD-10-CM | POA: Diagnosis not present

## 2019-10-02 DIAGNOSIS — D631 Anemia in chronic kidney disease: Secondary | ICD-10-CM | POA: Diagnosis not present

## 2019-10-02 DIAGNOSIS — E876 Hypokalemia: Secondary | ICD-10-CM | POA: Diagnosis not present

## 2019-10-04 DIAGNOSIS — D509 Iron deficiency anemia, unspecified: Secondary | ICD-10-CM | POA: Diagnosis not present

## 2019-10-04 DIAGNOSIS — D631 Anemia in chronic kidney disease: Secondary | ICD-10-CM | POA: Diagnosis not present

## 2019-10-04 DIAGNOSIS — Z992 Dependence on renal dialysis: Secondary | ICD-10-CM | POA: Diagnosis not present

## 2019-10-04 DIAGNOSIS — N2581 Secondary hyperparathyroidism of renal origin: Secondary | ICD-10-CM | POA: Diagnosis not present

## 2019-10-04 DIAGNOSIS — E876 Hypokalemia: Secondary | ICD-10-CM | POA: Diagnosis not present

## 2019-10-04 DIAGNOSIS — N186 End stage renal disease: Secondary | ICD-10-CM | POA: Diagnosis not present

## 2019-10-07 DIAGNOSIS — E876 Hypokalemia: Secondary | ICD-10-CM | POA: Diagnosis not present

## 2019-10-07 DIAGNOSIS — D631 Anemia in chronic kidney disease: Secondary | ICD-10-CM | POA: Diagnosis not present

## 2019-10-07 DIAGNOSIS — Z992 Dependence on renal dialysis: Secondary | ICD-10-CM | POA: Diagnosis not present

## 2019-10-07 DIAGNOSIS — N186 End stage renal disease: Secondary | ICD-10-CM | POA: Diagnosis not present

## 2019-10-07 DIAGNOSIS — D509 Iron deficiency anemia, unspecified: Secondary | ICD-10-CM | POA: Diagnosis not present

## 2019-10-07 DIAGNOSIS — N2581 Secondary hyperparathyroidism of renal origin: Secondary | ICD-10-CM | POA: Diagnosis not present

## 2019-10-08 ENCOUNTER — Encounter: Payer: Self-pay | Admitting: *Deleted

## 2019-10-08 ENCOUNTER — Ambulatory Visit
Admission: RE | Admit: 2019-10-08 | Discharge: 2019-10-08 | Disposition: A | Discharge status: Home / Self Care | Payer: Medicare Other | Source: Ambulatory Visit | Attending: Interventional Radiology | Admitting: Interventional Radiology

## 2019-10-08 ENCOUNTER — Other Ambulatory Visit: Payer: Self-pay

## 2019-10-08 DIAGNOSIS — C220 Liver cell carcinoma: Secondary | ICD-10-CM | POA: Diagnosis not present

## 2019-10-08 HISTORY — PX: IR RADIOLOGIST EVAL & MGMT: IMG5224

## 2019-10-08 NOTE — Progress Notes (Signed)
Chief Complaint: Patient was consulted remotely today (TeleHealth) for follow-up after thermal ablation of a hepatocellular carcinoma.  History of Present Illness: Sarah Harmon is a 73 y.o. 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  She has no significant current complaints.  A follow-up MRI of the abdomen was performed on 09/24/2019.  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   OSTEOPENIA 12/22/2009   Pancytopenia 07/13/2010   RENAL CYST, LEFT 12/22/2009   VITAMIN D DEFICIENCY 12/23/2010   WEIGHT LOSS 12/23/2010    Past Surgical History:  Procedure Laterality Date   A/V FISTULAGRAM N/A 10/25/2018   Procedure: A/V FISTULAGRAM;  Surgeon: Elam Dutch, MD;  Location: [facility];  Service: Cardiovascular;  Laterality: N/A;   ABDOMINAL HYSTERECTOMY     AV FISTULA PLACEMENT Left 05/21/2018   Procedure: INSERTION OF ARTERIOVENOUS (AV) GORE-TEX GRAFT UPPER ARM;  Surgeon: Elam Dutch, MD;  Location: [place];  Service: Vascular;  Laterality: Left;   AV FISTULA PLACEMENT Left 06/03/2018   Procedure: INSERTION OF ARTERIOVENOUS (AV) GORE-TEX 11mm x 10cm GRAFT INTO LEFT ARM;  Surgeon: Elam Dutch, MD;  Location: [place];  Service: Vascular;  Laterality: Left;   IR GENERIC HISTORICAL  10/18/2016   IR RADIOLOGIST EVAL & MGMT 10/18/2016 [name], MD [facility] INTERV RAD   IR RADIOLOGIST EVAL & MGMT  05/30/2017   IR RADIOLOGIST EVAL & MGMT  08/28/2018   surgery for liver ca      THROMBECTOMY AND REVISION OF ARTERIOVENTOUS (AV) GORETEX  GRAFT Left 06/03/2018   Procedure: THROMBECTOMY AND REVISION OF ARTERIOVENTOUS (AV) GORETEX  GRAFT ARM;  Surgeon: Elam Dutch, MD;  Location: [facility];  Service: Vascular;  Laterality: Left;    Allergies: Patient has no known allergies.  Medications: Prior to Admission medications   Medication Sig Start Date End Date Taking? Authorizing Provider  ALPRAZolam Duanne Moron) 0.5 MG tablet TAKE 1 TABLET BY MOUTH ONCE DAILY AT BEDTIME 06/27/19   Biagio Borg, MD  amLODipine (NORVASC) 10 MG tablet Take 1 tablet (10 mg total) by mouth daily. 05/28/19   Biagio Borg, MD  B Complex-C-Zn-Folic Acid (DIALYVITE 378 WITH ZINC) 0.8 MG TABS Take 1 tablet by mouth daily. 09/29/18   [provider]  Cholecalciferol (VITAMIN D3) 1000 UNITS CAPS Take 1,000 Units by mouth daily.     [provider]  Darbepoetin Alfa (ARANESP) 100 MCG/0.5ML SOSY injection Inject 0.5 mLs (100 mcg total) into the vein every Thursday with hemodialysis. 07/25/18   Cherene Altes, MD  folic acid (FOLVITE) 1 MG tablet Take 1 tablet by mouth once daily 07/28/19   Biagio Borg, MD  hydrALAZINE (APRESOLINE) 100 MG tablet Take 0.5 tablets (50 mg total) by mouth 3 (three) times daily. 05/28/19   Biagio Borg, MD  ketotifen (ZADITOR) 0.025 % ophthalmic solution Place 1 drop into both eyes 2 (two) times daily.    [provider]  lactulose ([place]) 10 GM/15ML  solution TAKE 30 ML (CC) BY MOUTH ONCE DAILY 07/21/19   Biagio Borg, MD  lidocaine-prilocaine (EMLA) cream Apply 1 application topically daily as needed (use before dialysis).  10/11/18   [provider]  propranolol (INDERAL) 40 MG tablet Take 1 tablet by mouth twice daily 09/22/19   Biagio Borg, MD  Propylene Glycol (SYSTANE BALANCE) 0.6 % SOLN Place 1 drop into both eyes daily.    [provider]     Family History  Problem Relation Age of Onset   Lung cancer Brother    Breast  cancer Other    Breast cancer Sister    Gastric cancer Sister    Lung cancer Sister    Colon cancer Neg Hx    Colon polyps Neg Hx    Rectal cancer Neg Hx    Stomach cancer Neg Hx    Esophageal cancer Neg Hx     Social History   Socioeconomic History   Marital status: Widowed    Spouse name: Not on file   Number of children: 4   Years of education: Not on file   Highest education level: Not on file  Occupational History   Occupation: retired  Scientist, product/process development strain: Not on file   Food insecurity    Worry: Not on file    Inability: Not on Lexicographer needs    Medical: Not on file    Non-medical: Not on file  Tobacco Use   Smoking status: Never Smoker   Smokeless tobacco: Never Used  Substance and Sexual Activity   Alcohol use: Yes    Alcohol/week: 3.0 - 4.0 standard drinks    Types: 3 - 4 Cans of beer per week    Comment: she quit 02/2015    Drug use: No   Sexual activity: Not on file  Lifestyle   Physical activity    Days per week: Not on file    Minutes per session: Not on file   Stress: Not on file  Relationships   Social connections    Talks on phone: Not on file    Gets together: Not on file    Attends religious service: Not on file    Active member of club or organization: Not on file    Attends meetings of clubs or organizations: Not on file    Relationship status: Not on file  Other Topics Concern   Not on file  Social History Narrative   Widowed, grown son lives with her   Retired from Photographer upkeep   Has total of #5 children   Enjoys watching TV and planting flowers    ECOG Status: 0 - Asymptomatic  Review of Systems  Constitutional: Negative.   Respiratory: Negative.   Cardiovascular: Negative.   Gastrointestinal: Negative.   Genitourinary: Negative.   Musculoskeletal: Negative.   Neurological: Negative.     Review of Systems: A 12 point ROS discussed and pertinent  positives are indicated in the HPI above.  All other systems are negative.  Physical Exam No direct physical exam was performed (except for noted visual exam findings with Video Visits).   Vital Signs: There were no vitals taken for this visit.  Imaging: Mr Abdomen Wo Contrast  Result Date: 09/25/2019 CLINICAL DATA:  Hepatocellular carcinoma.  Status post ablation. EXAM: MRI ABDOMEN WITHOUT CONTRAST TECHNIQUE: Multiplanar multisequence MR imaging was performed without the administration of intravenous contrast. COMPARISON:  11/15/2018 FINDINGS: Lower chest: Unremarkable. Hepatobiliary: Subcapsular  focus of abnormal signal again identified in the anterior right liver (segment VIII). This corresponds to the previously measured ablation defect and is stable in size and appearance since the prior study. Again measuring on T2 imaging (series 4) the lesion is 9 x 14 mm (image 18) currently compared to 9 by 15 mm previously. No additional focal liver lesions are evident although lack of intravenous contrast lessen sensitivity for detection. Nodular liver contour is compatible with cirrhosis. Diffuse loss of signal within the liver parenchyma is compatible with iron deposition. Gallbladder is markedly distended with what appears to be a 3.3 cm soft tissue lesion, new in the interval. This could potentially represent tumefactive sludge. As before, a stone is visible in the neck of the gallbladder (28/4). Pancreas: No focal mass lesion. No dilatation of the main duct. No intraparenchymal cyst. No peripancreatic edema. Spleen: Diffuse loss of signal intensity suggesting fatty deposition. Adrenals/Urinary Tract: No adrenal nodule or mass. Renal parenchyma appears mildly atrophic bilaterally with dominant complex cyst in the upper pole left kidney having appearance similar to prior. This lesion measures 4.1 x 3.0 cm today compared to 4.1 x 3.4 cm on axial imaging previously. Stomach/Bowel: Stomach is unremarkable. No  gastric wall thickening. No evidence of outlet obstruction. Duodenum is normally positioned as is the ligament of Treitz. No small bowel or colonic dilatation within the visualized abdomen. Vascular/Lymphatic: No abdominal aortic aneurysm. No abdominal lymphadenopathy. Other:  No intraperitoneal free fluid. Musculoskeletal: No suspicious abnormal marrow signal within the visualized bony anatomy. IMPRESSION: 1. Stable appearance of the segment VIII hepatic ablation defect. No new focal signal abnormality in the liver parenchyma although detection of new hepatic mass lesion is limited by inability to administer intravenous contrast material. 2. 3.3 cm irregular filling defect in the gallbladder lumen, potentially tumefactive sludge, but soft tissue lesion not excluded. Right upper quadrant abdominal ultrasound recommended to further evaluate. 3. Nodular hepatic contour compatible with cirrhosis. 4. Loss of signal intensity in the liver and spleen suggesting iron deposition. 5. 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 Continued close attention on follow-up recommended. Electronically Signed   By: Misty Stanley M.D.   On: 09/25/2019 08:21    Labs:  CBC: Recent Labs    10/25/18 1126 11/15/18 0756 06/06/19 1225  WBC  --  4.3 4.4  HGB 13.6 11.1* 11.9*  HCT 40.0 33.8* 36.7  PLT  --  90* 123.0*    COAGS: No results for input(s): INR, APTT in the last 8760 hours.  BMP: Recent Labs    10/25/18 1126 11/15/18 0756 06/06/19 1225  NA 139 133* 135  K 3.5 3.9 3.6  CL 93* 90* 91*  CO2  --  30 31  GLUCOSE 88 95 99  BUN 34* 32* 16  CALCIUM  --  10.0 10.2  CREATININE 4.60* 4.97* 4.85*  GFRNONAA  --  8*  --   GFRAA  --  9*  --     LIVER FUNCTION TESTS: Recent Labs    11/15/18 0756 06/06/19 1225  BILITOT 0.7 0.7  AST 25 17  ALT 12 14  ALKPHOS 89 111  PROT 8.0 8.3  ALBUMIN 3.5 4.3    Assessment and Plan:  The  follow-up MRI without contrast demonstrates a stable ablation defect in segment VIII of the liver without evidence of recurrent enlarging tumor.  No new liver lesions are identified.  Note is made of more prominent soft tissue filling defect  in the gallbladder lumen which may be representative of sludge.  There are no gallstones.  As soft tissue lesion cannot be excluded, a right upper quadrant abdominal ultrasound was recommended for further evaluation.  A complex cyst of the upper left kidney measuring 4 cm appears stable.  We will arrange for a follow-up right upper quadrant ultrasound.  I recommended another follow-up MRI in 1 year.  Electronically Signed: Azzie Roup 10/08/2019, 3:53 PM     I spent a total of 15 Minutes in remote  clinical consultation, greater than 50% of which was counseling/coordinating care post ablation of a hepatocellular carcinoma.    Visit type: Audio only (telephone). Audio (no video) only due to patient's lack of internet/smartphone capability. Alternative for in-person consultation at [facility], [address], [place], [place]. This visit type was conducted due to national recommendations for restrictions regarding the COVID-19 Pandemic (e.g. social distancing).  This format is felt to be most appropriate for this patient at this time.  All issues noted in this document were discussed and addressed.

## 2019-10-09 ENCOUNTER — Other Ambulatory Visit: Payer: Self-pay | Admitting: Interventional Radiology

## 2019-10-09 DIAGNOSIS — Z992 Dependence on renal dialysis: Secondary | ICD-10-CM | POA: Diagnosis not present

## 2019-10-09 DIAGNOSIS — E876 Hypokalemia: Secondary | ICD-10-CM | POA: Diagnosis not present

## 2019-10-09 DIAGNOSIS — N186 End stage renal disease: Secondary | ICD-10-CM | POA: Diagnosis not present

## 2019-10-09 DIAGNOSIS — D631 Anemia in chronic kidney disease: Secondary | ICD-10-CM | POA: Diagnosis not present

## 2019-10-09 DIAGNOSIS — D509 Iron deficiency anemia, unspecified: Secondary | ICD-10-CM | POA: Diagnosis not present

## 2019-10-09 DIAGNOSIS — N2581 Secondary hyperparathyroidism of renal origin: Secondary | ICD-10-CM | POA: Diagnosis not present

## 2019-10-09 DIAGNOSIS — K828 Other specified diseases of gallbladder: Secondary | ICD-10-CM

## 2019-10-11 DIAGNOSIS — E876 Hypokalemia: Secondary | ICD-10-CM | POA: Diagnosis not present

## 2019-10-11 DIAGNOSIS — N186 End stage renal disease: Secondary | ICD-10-CM | POA: Diagnosis not present

## 2019-10-11 DIAGNOSIS — Z992 Dependence on renal dialysis: Secondary | ICD-10-CM | POA: Diagnosis not present

## 2019-10-11 DIAGNOSIS — D509 Iron deficiency anemia, unspecified: Secondary | ICD-10-CM | POA: Diagnosis not present

## 2019-10-11 DIAGNOSIS — N2581 Secondary hyperparathyroidism of renal origin: Secondary | ICD-10-CM | POA: Diagnosis not present

## 2019-10-11 DIAGNOSIS — D631 Anemia in chronic kidney disease: Secondary | ICD-10-CM | POA: Diagnosis not present

## 2019-10-14 DIAGNOSIS — E876 Hypokalemia: Secondary | ICD-10-CM | POA: Diagnosis not present

## 2019-10-14 DIAGNOSIS — D631 Anemia in chronic kidney disease: Secondary | ICD-10-CM | POA: Diagnosis not present

## 2019-10-14 DIAGNOSIS — N186 End stage renal disease: Secondary | ICD-10-CM | POA: Diagnosis not present

## 2019-10-14 DIAGNOSIS — N2581 Secondary hyperparathyroidism of renal origin: Secondary | ICD-10-CM | POA: Diagnosis not present

## 2019-10-14 DIAGNOSIS — D509 Iron deficiency anemia, unspecified: Secondary | ICD-10-CM | POA: Diagnosis not present

## 2019-10-14 DIAGNOSIS — Z992 Dependence on renal dialysis: Secondary | ICD-10-CM | POA: Diagnosis not present

## 2019-10-16 DIAGNOSIS — Z992 Dependence on renal dialysis: Secondary | ICD-10-CM | POA: Diagnosis not present

## 2019-10-16 DIAGNOSIS — D509 Iron deficiency anemia, unspecified: Secondary | ICD-10-CM | POA: Diagnosis not present

## 2019-10-16 DIAGNOSIS — N186 End stage renal disease: Secondary | ICD-10-CM | POA: Diagnosis not present

## 2019-10-16 DIAGNOSIS — E876 Hypokalemia: Secondary | ICD-10-CM | POA: Diagnosis not present

## 2019-10-16 DIAGNOSIS — D631 Anemia in chronic kidney disease: Secondary | ICD-10-CM | POA: Diagnosis not present

## 2019-10-16 DIAGNOSIS — N2581 Secondary hyperparathyroidism of renal origin: Secondary | ICD-10-CM | POA: Diagnosis not present

## 2019-10-18 DIAGNOSIS — N186 End stage renal disease: Secondary | ICD-10-CM | POA: Diagnosis not present

## 2019-10-18 DIAGNOSIS — Z992 Dependence on renal dialysis: Secondary | ICD-10-CM | POA: Diagnosis not present

## 2019-10-18 DIAGNOSIS — D509 Iron deficiency anemia, unspecified: Secondary | ICD-10-CM | POA: Diagnosis not present

## 2019-10-18 DIAGNOSIS — D631 Anemia in chronic kidney disease: Secondary | ICD-10-CM | POA: Diagnosis not present

## 2019-10-18 DIAGNOSIS — E876 Hypokalemia: Secondary | ICD-10-CM | POA: Diagnosis not present

## 2019-10-18 DIAGNOSIS — N2581 Secondary hyperparathyroidism of renal origin: Secondary | ICD-10-CM | POA: Diagnosis not present

## 2019-10-19 DIAGNOSIS — I129 Hypertensive chronic kidney disease with stage 1 through stage 4 chronic kidney disease, or unspecified chronic kidney disease: Secondary | ICD-10-CM | POA: Diagnosis not present

## 2019-10-19 DIAGNOSIS — N186 End stage renal disease: Secondary | ICD-10-CM | POA: Diagnosis not present

## 2019-10-19 DIAGNOSIS — Z992 Dependence on renal dialysis: Secondary | ICD-10-CM | POA: Diagnosis not present

## 2019-10-20 ENCOUNTER — Ambulatory Visit
Admission: RE | Admit: 2019-10-20 | Discharge: 2019-10-20 | Disposition: A | Discharge status: Home / Self Care | Payer: Medicare Other | Source: Ambulatory Visit | Attending: Interventional Radiology | Admitting: Interventional Radiology

## 2019-10-20 DIAGNOSIS — K828 Other specified diseases of gallbladder: Secondary | ICD-10-CM | POA: Diagnosis not present

## 2019-10-21 DIAGNOSIS — N2581 Secondary hyperparathyroidism of renal origin: Secondary | ICD-10-CM | POA: Diagnosis not present

## 2019-10-21 DIAGNOSIS — D631 Anemia in chronic kidney disease: Secondary | ICD-10-CM | POA: Diagnosis not present

## 2019-10-21 DIAGNOSIS — E876 Hypokalemia: Secondary | ICD-10-CM | POA: Diagnosis not present

## 2019-10-21 DIAGNOSIS — Z992 Dependence on renal dialysis: Secondary | ICD-10-CM | POA: Diagnosis not present

## 2019-10-21 DIAGNOSIS — N186 End stage renal disease: Secondary | ICD-10-CM | POA: Diagnosis not present

## 2019-10-23 ENCOUNTER — Other Ambulatory Visit: Payer: Self-pay | Admitting: Internal Medicine

## 2019-10-23 DIAGNOSIS — Z992 Dependence on renal dialysis: Secondary | ICD-10-CM | POA: Diagnosis not present

## 2019-10-23 DIAGNOSIS — N2581 Secondary hyperparathyroidism of renal origin: Secondary | ICD-10-CM | POA: Diagnosis not present

## 2019-10-23 DIAGNOSIS — N186 End stage renal disease: Secondary | ICD-10-CM | POA: Diagnosis not present

## 2019-10-23 DIAGNOSIS — E876 Hypokalemia: Secondary | ICD-10-CM | POA: Diagnosis not present

## 2019-10-23 DIAGNOSIS — D631 Anemia in chronic kidney disease: Secondary | ICD-10-CM | POA: Diagnosis not present

## 2019-10-24 ENCOUNTER — Other Ambulatory Visit (HOSPITAL_COMMUNITY): Payer: Self-pay | Admitting: Interventional Radiology

## 2019-10-24 DIAGNOSIS — C220 Liver cell carcinoma: Secondary | ICD-10-CM

## 2019-10-25 DIAGNOSIS — D631 Anemia in chronic kidney disease: Secondary | ICD-10-CM | POA: Diagnosis not present

## 2019-10-25 DIAGNOSIS — E876 Hypokalemia: Secondary | ICD-10-CM | POA: Diagnosis not present

## 2019-10-25 DIAGNOSIS — N186 End stage renal disease: Secondary | ICD-10-CM | POA: Diagnosis not present

## 2019-10-25 DIAGNOSIS — N2581 Secondary hyperparathyroidism of renal origin: Secondary | ICD-10-CM | POA: Diagnosis not present

## 2019-10-25 DIAGNOSIS — Z992 Dependence on renal dialysis: Secondary | ICD-10-CM | POA: Diagnosis not present

## 2019-10-27 DIAGNOSIS — N186 End stage renal disease: Secondary | ICD-10-CM | POA: Diagnosis not present

## 2019-10-27 DIAGNOSIS — I871 Compression of vein: Secondary | ICD-10-CM | POA: Diagnosis not present

## 2019-10-27 DIAGNOSIS — Z992 Dependence on renal dialysis: Secondary | ICD-10-CM | POA: Diagnosis not present

## 2019-10-27 DIAGNOSIS — T82858A Stenosis of vascular prosthetic devices, implants and grafts, initial encounter: Secondary | ICD-10-CM | POA: Diagnosis not present

## 2019-10-28 DIAGNOSIS — E876 Hypokalemia: Secondary | ICD-10-CM | POA: Diagnosis not present

## 2019-10-28 DIAGNOSIS — N186 End stage renal disease: Secondary | ICD-10-CM | POA: Diagnosis not present

## 2019-10-28 DIAGNOSIS — N2581 Secondary hyperparathyroidism of renal origin: Secondary | ICD-10-CM | POA: Diagnosis not present

## 2019-10-28 DIAGNOSIS — Z992 Dependence on renal dialysis: Secondary | ICD-10-CM | POA: Diagnosis not present

## 2019-10-28 DIAGNOSIS — D631 Anemia in chronic kidney disease: Secondary | ICD-10-CM | POA: Diagnosis not present

## 2019-10-30 ENCOUNTER — Other Ambulatory Visit: Payer: Self-pay | Admitting: Internal Medicine

## 2019-10-30 ENCOUNTER — Ambulatory Visit
Admission: RE | Admit: 2019-10-30 | Discharge: 2019-10-30 | Disposition: A | Discharge status: Home / Self Care | Payer: Medicare Other | Source: Ambulatory Visit | Attending: Interventional Radiology | Admitting: Interventional Radiology

## 2019-10-30 ENCOUNTER — Other Ambulatory Visit: Payer: Self-pay

## 2019-10-30 ENCOUNTER — Encounter: Payer: Self-pay | Admitting: *Deleted

## 2019-10-30 DIAGNOSIS — N2581 Secondary hyperparathyroidism of renal origin: Secondary | ICD-10-CM | POA: Diagnosis not present

## 2019-10-30 DIAGNOSIS — C220 Liver cell carcinoma: Secondary | ICD-10-CM | POA: Diagnosis not present

## 2019-10-30 DIAGNOSIS — E876 Hypokalemia: Secondary | ICD-10-CM | POA: Diagnosis not present

## 2019-10-30 DIAGNOSIS — Z992 Dependence on renal dialysis: Secondary | ICD-10-CM | POA: Diagnosis not present

## 2019-10-30 DIAGNOSIS — D631 Anemia in chronic kidney disease: Secondary | ICD-10-CM | POA: Diagnosis not present

## 2019-10-30 DIAGNOSIS — N186 End stage renal disease: Secondary | ICD-10-CM | POA: Diagnosis not present

## 2019-10-30 HISTORY — PX: IR RADIOLOGIST EVAL & MGMT: IMG5224

## 2019-11-01 DIAGNOSIS — N186 End stage renal disease: Secondary | ICD-10-CM | POA: Diagnosis not present

## 2019-11-01 DIAGNOSIS — E876 Hypokalemia: Secondary | ICD-10-CM | POA: Diagnosis not present

## 2019-11-01 DIAGNOSIS — D631 Anemia in chronic kidney disease: Secondary | ICD-10-CM | POA: Diagnosis not present

## 2019-11-01 DIAGNOSIS — N2581 Secondary hyperparathyroidism of renal origin: Secondary | ICD-10-CM | POA: Diagnosis not present

## 2019-11-01 DIAGNOSIS — Z992 Dependence on renal dialysis: Secondary | ICD-10-CM | POA: Diagnosis not present

## 2019-11-04 DIAGNOSIS — E876 Hypokalemia: Secondary | ICD-10-CM | POA: Diagnosis not present

## 2019-11-04 DIAGNOSIS — Z992 Dependence on renal dialysis: Secondary | ICD-10-CM | POA: Diagnosis not present

## 2019-11-04 DIAGNOSIS — N186 End stage renal disease: Secondary | ICD-10-CM | POA: Diagnosis not present

## 2019-11-04 DIAGNOSIS — D631 Anemia in chronic kidney disease: Secondary | ICD-10-CM | POA: Diagnosis not present

## 2019-11-04 DIAGNOSIS — N2581 Secondary hyperparathyroidism of renal origin: Secondary | ICD-10-CM | POA: Diagnosis not present

## 2019-11-06 DIAGNOSIS — N186 End stage renal disease: Secondary | ICD-10-CM | POA: Diagnosis not present

## 2019-11-06 DIAGNOSIS — N2581 Secondary hyperparathyroidism of renal origin: Secondary | ICD-10-CM | POA: Diagnosis not present

## 2019-11-06 DIAGNOSIS — E876 Hypokalemia: Secondary | ICD-10-CM | POA: Diagnosis not present

## 2019-11-06 DIAGNOSIS — Z992 Dependence on renal dialysis: Secondary | ICD-10-CM | POA: Diagnosis not present

## 2019-11-06 DIAGNOSIS — D631 Anemia in chronic kidney disease: Secondary | ICD-10-CM | POA: Diagnosis not present

## 2019-11-08 DIAGNOSIS — E876 Hypokalemia: Secondary | ICD-10-CM | POA: Diagnosis not present

## 2019-11-08 DIAGNOSIS — Z992 Dependence on renal dialysis: Secondary | ICD-10-CM | POA: Diagnosis not present

## 2019-11-08 DIAGNOSIS — N2581 Secondary hyperparathyroidism of renal origin: Secondary | ICD-10-CM | POA: Diagnosis not present

## 2019-11-08 DIAGNOSIS — N186 End stage renal disease: Secondary | ICD-10-CM | POA: Diagnosis not present

## 2019-11-08 DIAGNOSIS — D631 Anemia in chronic kidney disease: Secondary | ICD-10-CM | POA: Diagnosis not present

## 2019-11-10 DIAGNOSIS — D631 Anemia in chronic kidney disease: Secondary | ICD-10-CM | POA: Diagnosis not present

## 2019-11-10 DIAGNOSIS — E876 Hypokalemia: Secondary | ICD-10-CM | POA: Diagnosis not present

## 2019-11-10 DIAGNOSIS — Z992 Dependence on renal dialysis: Secondary | ICD-10-CM | POA: Diagnosis not present

## 2019-11-10 DIAGNOSIS — N186 End stage renal disease: Secondary | ICD-10-CM | POA: Diagnosis not present

## 2019-11-10 DIAGNOSIS — N2581 Secondary hyperparathyroidism of renal origin: Secondary | ICD-10-CM | POA: Diagnosis not present

## 2019-11-12 DIAGNOSIS — D631 Anemia in chronic kidney disease: Secondary | ICD-10-CM | POA: Diagnosis not present

## 2019-11-12 DIAGNOSIS — N2581 Secondary hyperparathyroidism of renal origin: Secondary | ICD-10-CM | POA: Diagnosis not present

## 2019-11-12 DIAGNOSIS — Z992 Dependence on renal dialysis: Secondary | ICD-10-CM | POA: Diagnosis not present

## 2019-11-12 DIAGNOSIS — N186 End stage renal disease: Secondary | ICD-10-CM | POA: Diagnosis not present

## 2019-11-12 DIAGNOSIS — E876 Hypokalemia: Secondary | ICD-10-CM | POA: Diagnosis not present

## 2019-11-15 DIAGNOSIS — N2581 Secondary hyperparathyroidism of renal origin: Secondary | ICD-10-CM | POA: Diagnosis not present

## 2019-11-15 DIAGNOSIS — N186 End stage renal disease: Secondary | ICD-10-CM | POA: Diagnosis not present

## 2019-11-15 DIAGNOSIS — D631 Anemia in chronic kidney disease: Secondary | ICD-10-CM | POA: Diagnosis not present

## 2019-11-15 DIAGNOSIS — E876 Hypokalemia: Secondary | ICD-10-CM | POA: Diagnosis not present

## 2019-11-15 DIAGNOSIS — Z992 Dependence on renal dialysis: Secondary | ICD-10-CM | POA: Diagnosis not present

## 2019-11-18 NOTE — Progress Notes (Signed)
Chief Complaint: Patient was consulted remotely today (TeleHealth) for review of imaging.  History of Present Illness: Sarah Harmon is a 73 y.o. 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  Follow-up MRI of the abdomen demonstrated prominent soft tissue filling defect in the gallbladder lumen.  She is here after a right upper quadrant ultrasound was performed on 10/20/2019.  She is asymptomatic.  Past Medical History:  Diagnosis Date  . ALCOHOL ABUSE 07/13/2010  . ANXIETY 12/22/2009  . Cancer [facility])    liver cancer  . DEPRESSION 12/22/2009  . 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 DJD (degenerative joint disease), cervical    patient denies on preop of 05/10/15   . FATIGUE 12/23/2010  . GALLSTONES 12/22/2009  . Gallstones   . Headache    occasional   . HYPERLIPIDEMIA 12/22/2009  . HYPERTENSION 12/22/2009  . HYPONATREMIA 12/22/2009  . 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 OSTEOPENIA 12/22/2009  . Pancytopenia 07/13/2010  . RENAL CYST, LEFT 12/22/2009  . VITAMIN D DEFICIENCY 12/23/2010  . WEIGHT LOSS 12/23/2010    Past Surgical History:  Procedure Laterality Date  . A/V FISTULAGRAM N/A 10/25/2018   Procedure: A/V FISTULAGRAM;  Surgeon: Elam Dutch, MD;  Location: [place] [facility];  Service: Cardiovascular;  Laterality: N/A;  . ABDOMINAL HYSTERECTOMY    . AV FISTULA PLACEMENT Left 05/21/2018   Procedure: INSERTION OF ARTERIOVENOUS (AV) GORE-TEX GRAFT UPPER ARM;  Surgeon: Elam Dutch, MD;  Location: [place] [facility];  Service: Vascular;  Laterality: Left;  . AV FISTULA PLACEMENT Left 06/03/2018   Procedure: INSERTION OF ARTERIOVENOUS (AV) GORE-TEX 42mm x 10cm GRAFT INTO LEFT ARM;  Surgeon: Elam Dutch, MD;  Location: [place];  Service: Vascular;  Laterality: Left;  . IR GENERIC HISTORICAL  10/18/2016   IR RADIOLOGIST EVAL & MGMT 10/18/2016 [name], MD [facility] INTERV RAD  . IR RADIOLOGIST EVAL & MGMT  05/30/2017  . IR  RADIOLOGIST EVAL & MGMT  08/28/2018  . IR RADIOLOGIST EVAL & MGMT  10/08/2019  . IR RADIOLOGIST EVAL & MGMT  10/30/2019  . surgery for liver ca    . THROMBECTOMY AND REVISION OF ARTERIOVENTOUS (AV) GORETEX  GRAFT Left 06/03/2018   Procedure: THROMBECTOMY AND REVISION OF ARTERIOVENTOUS (AV) GORETEX  GRAFT ARM;  Surgeon: Elam Dutch, MD;  Location: [place];  Service: Vascular;  Laterality: Left;    Allergies: Patient has no known allergies.  Medications: Prior to Admission medications   Medication Sig Start Date End Date Taking? Authorizing Provider  ALPRAZolam Duanne Moron) 0.5 MG tablet TAKE 1 TABLET BY MOUTH ONCE DAILY AT BEDTIME 06/27/19   Biagio Borg, MD  amLODipine (NORVASC) 10 MG tablet Take 1 tablet (10 mg total) by mouth daily. 05/28/19   Biagio Borg, MD  B Complex-C-Zn-Folic Acid (DIALYVITE 382 WITH ZINC) 0.8 MG TABS Take 1 tablet by mouth daily. 09/29/18   [provider]  Cholecalciferol (VITAMIN D3) 1000 UNITS CAPS Take 1,000 Units by mouth daily.     [provider]  Darbepoetin Alfa (ARANESP) 100 MCG/0.5ML SOSY injection Inject 0.5 mLs (100 mcg total) into the vein every Thursday with hemodialysis. 07/25/18   Cherene Altes, MD  folic acid (FOLVITE) 1 MG tablet Take 1 tablet by mouth once daily 10/30/19   Biagio Borg, MD  hydrALAZINE (APRESOLINE) 100 MG tablet Take 0.5 tablets (50 mg total) by mouth 3 (three) times daily. 05/28/19   Jenny Reichmann,  Hunt Oris, MD  ketotifen (ZADITOR) 0.025 % ophthalmic solution Place 1 drop into both eyes 2 (two) times daily.    [provider]  lactulose (CHRONULAC) 10 GM/15ML solution TAKE 30 ML BY MOUTH ONCE DAILY 10/24/19   Biagio Borg, MD  lidocaine-prilocaine (EMLA) cream Apply 1 application topically daily as needed (use before dialysis).  10/11/18   [provider]  propranolol (INDERAL) 40 MG tablet Take 1 tablet by mouth twice daily 09/22/19   Biagio Borg, MD  Propylene Glycol (SYSTANE BALANCE) 0.6 % SOLN  Place 1 drop into both eyes daily.    [provider]     Family History  Problem Relation Age of Onset  . Lung cancer Brother   . Breast cancer Other   . Breast cancer Sister   . Gastric cancer Sister   . Lung cancer Sister   . Colon cancer Neg Hx   . Colon polyps Neg Hx   . Rectal cancer Neg Hx   . Stomach cancer Neg Hx   . Esophageal cancer Neg Hx     Social History   Socioeconomic History  . Marital status: Widowed    Spouse name: Not on file  . Number of children: 4  . Years of education: Not on file  . Highest education level: Not on file  Occupational History  . Occupation: retired  Scientific laboratory technician  . Financial resource strain: Not on file  . Food insecurity    Worry: Not on file    Inability: Not on file  . Transportation needs    Medical: Not on file    Non-medical: Not on file  Tobacco Use  . Smoking status: Never Smoker  . Smokeless tobacco: Never Used  Substance and Sexual Activity  . Alcohol use: Yes    Alcohol/week: 3.0 - 4.0 standard drinks    Types: 3 - 4 Cans of beer per week    Comment: she quit 02/2015   . Drug use: No  . Sexual activity: Not on file  Lifestyle  . Physical activity    Days per week: Not on file    Minutes per session: Not on file  . Stress: Not on file  Relationships  . Social Herbalist on phone: Not on file    Gets together: Not on file    Attends religious service: Not on file    Active member of club or organization: Not on file    Attends meetings of clubs or organizations: Not on file    Relationship status: Not on file  Other Topics Concern  . Not on file  Social History Narrative   Widowed, grown son lives with her   Retired from Manufacturing engineer   Has total of #5 children   Enjoys watching TV and planting flowers    Review of Systems  Review of Systems: A 12 point ROS discussed and pertinent positives are indicated in the HPI above.  All other systems are negative.  Physical  Exam No direct physical exam was performed (except for noted visual exam findings with Video Visits).    Vital Signs: There were no vitals taken for this visit.  Imaging: Ir Radiologist Eval & Mgmt  Result Date: 10/30/2019 Please refer to notes tab for details about interventional procedure. (Op Note)  US Abdomen Limited Ruq  Result Date: 10/20/2019 CLINICAL DATA:  Gallbladder sludge seen on recent abdominal MRI EXAM: ULTRASOUND ABDOMEN LIMITED RIGHT UPPER QUADRANT COMPARISON:  Abdominal MRI September 24, 2019 FINDINGS: Gallbladder: There is inspissated sludge throughout the gallbladder. No gallstones are evident. There is no gallbladder wall thickening or pericholecystic fluid. No sonographic Murphy sign noted by sonographer. Common bile duct: Diameter: 6 mm. No intrahepatic or extrahepatic biliary duct dilatation. Liver: No focal lesion identified. Liver echogenicity is somewhat inhomogeneous and overall increased. Portal vein is patent on color Doppler imaging with normal direction of blood flow towards the liver. Other: None. IMPRESSION: 1. Extensive inspissated sludge throughout the gallbladder. No gallstones are evident. It must be cautioned that gallstones could easily be obscured by this degree of sludge. No gallbladder wall thickening or pericholecystic fluid. 2. Liver echotexture is homogeneous overall increased, an appearance indicative of hepatic steatosis. While no focal liver lesions are evident on this study, must be cautioned that the sensitivity of ultrasound for detection of focal liver lesions is diminished in this circumstance. Electronically Signed   By: Lowella Grip III M.D.   On: 10/20/2019 08:50    Labs:  CBC: Recent Labs    06/06/19 1225  WBC 4.4  HGB 11.9*  HCT 36.7  PLT 123.0*    COAGS: No results for input(s): INR, APTT in the last 8760 hours.  BMP: Recent Labs    06/06/19 1225  NA 135  K 3.6  CL 91*  CO2 31  GLUCOSE 99  BUN 16  CALCIUM 10.2   CREATININE 4.85*    LIVER FUNCTION TESTS: Recent Labs    06/06/19 1225  BILITOT 0.7  AST 17  ALT 14  ALKPHOS 111  PROT 8.3  ALBUMIN 4.3    Assessment and Plan:  I spoke with Sarah Harmon over the phone.  The follow-up right upper quadrant ultrasound demonstrates inspissated sludge in the gallbladder without evidence of of tumor or gallbladder wall thickening.  Given that she is asymptomatic, I did not recommend any further work-up.  We will continue with a planned follow-up MRI of the abdomen in 1 year.  Electronically Signed: Azzie Roup 11/18/2019, 9:32 AM     I spent a total of 10 Minutes in remote  clinical consultation, greater than 50% of which was counseling/coordinating care post liver ablation.    Visit type: Audio only (telephone). Audio (no video) only due to patient's lack of internet/smartphone capability. Alternative for in-person consultation at [facility], [place] [address], [place], [place]. This visit type was conducted due to national recommendations for restrictions regarding the COVID-19 Pandemic (e.g. social distancing).  This format is felt to be most appropriate for this patient at this time.  All issues noted in this document were discussed and addressed.

## 2019-11-18 NOTE — Addendum Note (Signed)
Encounter addended by: [name], MD on: 11/18/2019 9:45 AM  Actions taken: Clinical Note Signed

## 2019-11-28 ENCOUNTER — Ambulatory Visit: Payer: Medicare Other | Admitting: Internal Medicine

## 2019-12-01 ENCOUNTER — Other Ambulatory Visit (INDEPENDENT_AMBULATORY_CARE_PROVIDER_SITE_OTHER): Payer: Medicare Other

## 2019-12-01 ENCOUNTER — Encounter: Payer: Self-pay | Admitting: Internal Medicine

## 2019-12-01 ENCOUNTER — Ambulatory Visit (INDEPENDENT_AMBULATORY_CARE_PROVIDER_SITE_OTHER): Payer: Medicare Other | Admitting: Internal Medicine

## 2019-12-01 ENCOUNTER — Telehealth: Payer: Self-pay

## 2019-12-01 ENCOUNTER — Other Ambulatory Visit: Payer: Self-pay

## 2019-12-01 VITALS — BP 122/74 | HR 67 | Temp 98.3°F | Ht 63.0 in | Wt 119.0 lb

## 2019-12-01 DIAGNOSIS — R7302 Impaired glucose tolerance (oral): Secondary | ICD-10-CM | POA: Diagnosis not present

## 2019-12-01 DIAGNOSIS — N186 End stage renal disease: Secondary | ICD-10-CM

## 2019-12-01 DIAGNOSIS — D649 Anemia, unspecified: Secondary | ICD-10-CM | POA: Diagnosis not present

## 2019-12-01 DIAGNOSIS — C220 Liver cell carcinoma: Secondary | ICD-10-CM

## 2019-12-01 DIAGNOSIS — E538 Deficiency of other specified B group vitamins: Secondary | ICD-10-CM

## 2019-12-01 DIAGNOSIS — E785 Hyperlipidemia, unspecified: Secondary | ICD-10-CM | POA: Diagnosis not present

## 2019-12-01 DIAGNOSIS — I10 Essential (primary) hypertension: Secondary | ICD-10-CM

## 2019-12-01 DIAGNOSIS — E559 Vitamin D deficiency, unspecified: Secondary | ICD-10-CM | POA: Diagnosis not present

## 2019-12-01 DIAGNOSIS — D631 Anemia in chronic kidney disease: Secondary | ICD-10-CM

## 2019-12-01 LAB — CBC WITH DIFFERENTIAL/PLATELET
Basophils Absolute: 0.1 10*3/uL (ref 0.0–0.1)
Basophils Relative: 1.4 % (ref 0.0–3.0)
Eosinophils Absolute: 0.3 10*3/uL (ref 0.0–0.7)
Eosinophils Relative: 8.5 % — ABNORMAL HIGH (ref 0.0–5.0)
HCT: 26.7 % — ABNORMAL LOW (ref 36.0–46.0)
Hemoglobin: 8.8 g/dL — ABNORMAL LOW (ref 12.0–15.0)
Lymphocytes Relative: 22.1 % (ref 12.0–46.0)
Lymphs Abs: 0.9 10*3/uL (ref 0.7–4.0)
MCHC: 33.1 g/dL (ref 30.0–36.0)
MCV: 91.9 fl (ref 78.0–100.0)
Monocytes Absolute: 0.3 10*3/uL (ref 0.1–1.0)
Monocytes Relative: 8.6 % (ref 3.0–12.0)
Neutro Abs: 2.4 10*3/uL (ref 1.4–7.7)
Neutrophils Relative %: 59.4 % (ref 43.0–77.0)
Platelets: 89 10*3/uL — ABNORMAL LOW (ref 150.0–400.0)
RBC: 2.91 Mil/uL — ABNORMAL LOW (ref 3.87–5.11)
RDW: 17.6 % — ABNORMAL HIGH (ref 11.5–15.5)
WBC: 4 10*3/uL (ref 4.0–10.5)

## 2019-12-01 LAB — BASIC METABOLIC PANEL WITH GFR
BUN: 28 mg/dL — ABNORMAL HIGH (ref 6–23)
CO2: 31 meq/L (ref 19–32)
Calcium: 9.4 mg/dL (ref 8.4–10.5)
Chloride: 98 meq/L (ref 96–112)
Creatinine, Ser: 7 mg/dL (ref 0.40–1.20)
GFR: 6.92 mL/min — CL
Glucose, Bld: 94 mg/dL (ref 70–99)
Potassium: 3.9 meq/L (ref 3.5–5.1)
Sodium: 139 meq/L (ref 135–145)

## 2019-12-01 LAB — IBC PANEL
Iron: 62 ug/dL (ref 42–145)
Saturation Ratios: 21.8 % (ref 20.0–50.0)
Transferrin: 203 mg/dL — ABNORMAL LOW (ref 212.0–360.0)

## 2019-12-01 LAB — HEPATIC FUNCTION PANEL
ALT: 8 U/L (ref 0–35)
AST: 15 U/L (ref 0–37)
Albumin: 3.7 g/dL (ref 3.5–5.2)
Alkaline Phosphatase: 123 U/L — ABNORMAL HIGH (ref 39–117)
Bilirubin, Direct: 0.1 mg/dL (ref 0.0–0.3)
Total Bilirubin: 0.5 mg/dL (ref 0.2–1.2)
Total Protein: 7.3 g/dL (ref 6.0–8.3)

## 2019-12-01 LAB — VITAMIN B12: Vitamin B-12: 641 pg/mL (ref 211–911)

## 2019-12-01 LAB — HEMOGLOBIN A1C: Hgb A1c MFr Bld: 4.4 % — ABNORMAL LOW (ref 4.6–6.5)

## 2019-12-01 LAB — VITAMIN D 25 HYDROXY (VIT D DEFICIENCY, FRACTURES): VITD: 75.3 ng/mL (ref 30.00–100.00)

## 2019-12-01 NOTE — Assessment & Plan Note (Signed)
Due for oncology f/u, for LFts,  to f/u any worsening symptoms or concerns

## 2019-12-01 NOTE — Assessment & Plan Note (Signed)
stable overall by history and exam, recent data reviewed with pt, and pt to continue medical treatment as before,  to f/u any worsening symptoms or concerns  

## 2019-12-01 NOTE — Assessment & Plan Note (Signed)
For f/u cbc,  to f/u any worsening symptoms or concerns

## 2019-12-01 NOTE — Assessment & Plan Note (Signed)
Cont HD T-TH-Sat

## 2019-12-01 NOTE — Patient Instructions (Signed)
You will be contacted regarding the referral for: oncology  Please continue all other medications as before, and refills have been done if requested.  Please have the pharmacy call with any other refills you may need.  Please continue your efforts at being more active, low cholesterol diet, and weight control..  Please keep your appointments with your specialists as you may have planned  Please go to the LAB in the Basement (turn left off the elevator) for the tests to be done today  You will be contacted by phone if any changes need to be made immediately.  Otherwise, you will receive a letter about your results with an explanation, but please check with MyChart first.  Please remember to sign up for MyChart if you have not done so, as this will be important to you in the future with finding out test results, communicating by private email, and scheduling acute appointments online when needed.

## 2019-12-01 NOTE — Progress Notes (Signed)
Subjective:    Patient ID: Sarah Harmon, female    DOB: 05-10-1946, 73 y.o.   MRN: [id number]  HPI    Here to f/u; overall doing ok,  Pt denies chest pain, increasing sob or doe, wheezing, orthopnea, PND, increased LE swelling, palpitations, dizziness or syncope.  Pt denies new neurological symptoms such as new headache, or facial or extremity weakness or numbness.  Pt denies polydipsia, polyuria, or low sugar episode.  Pt states overall good compliance with meds, mostly trying to follow appropriate diet, with wt overall stable,  but little exercise however. No overt bleeding. No recent cbc, and due for oncology f/u.  No new complaints Past Medical History:  Diagnosis Date  . ALCOHOL ABUSE 07/13/2010  . ANXIETY 12/22/2009  . Cancer [facility])    liver cancer  . DEPRESSION 12/22/2009  . 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 DJD (degenerative joint disease), cervical    patient denies on preop of 05/10/15   . FATIGUE 12/23/2010  . GALLSTONES 12/22/2009  . Gallstones   . Headache    occasional   . HYPERLIPIDEMIA 12/22/2009  . HYPERTENSION 12/22/2009  . HYPONATREMIA 12/22/2009  . 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 OSTEOPENIA 12/22/2009  . Pancytopenia 07/13/2010  . RENAL CYST, LEFT 12/22/2009  . VITAMIN D DEFICIENCY 12/23/2010  . WEIGHT LOSS 12/23/2010   Past Surgical History:  Procedure Laterality Date  . A/V FISTULAGRAM N/A 10/25/2018   Procedure: A/V FISTULAGRAM;  Surgeon: Elam Dutch, MD;  Location: [place] CV LAB;  Service: Cardiovascular;  Laterality: N/A;  . ABDOMINAL HYSTERECTOMY    . AV FISTULA PLACEMENT Left 05/21/2018   Procedure: INSERTION OF ARTERIOVENOUS (AV) GORE-TEX GRAFT UPPER ARM;  Surgeon: Elam Dutch, MD;  Location: [place] OR;  Service: Vascular;  Laterality: Left;  . AV FISTULA PLACEMENT Left 06/03/2018   Procedure: INSERTION OF ARTERIOVENOUS (AV) GORE-TEX 53mm x 10cm GRAFT INTO LEFT ARM;  Surgeon: Elam Dutch, MD;  Location: [place];  Service: Vascular;   Laterality: Left;  . IR GENERIC HISTORICAL  10/18/2016   IR RADIOLOGIST EVAL & MGMT 10/18/2016 [name], MD [facility] INTERV RAD  . IR RADIOLOGIST EVAL & MGMT  05/30/2017  . IR RADIOLOGIST EVAL & MGMT  08/28/2018  . IR RADIOLOGIST EVAL & MGMT  10/08/2019  . IR RADIOLOGIST EVAL & MGMT  10/30/2019  . surgery for liver ca    . THROMBECTOMY AND REVISION OF ARTERIOVENTOUS (AV) GORETEX  GRAFT Left 06/03/2018   Procedure: THROMBECTOMY AND REVISION OF ARTERIOVENTOUS (AV) GORETEX  GRAFT ARM;  Surgeon: Elam Dutch, MD;  Location: [place];  Service: Vascular;  Laterality: Left;    reports that she has never smoked. She has never used smokeless tobacco. She reports current alcohol use of about 3.0 - 4.0 standard drinks of alcohol per week. She reports that she does not use drugs. family history includes Breast cancer in her sister and another family member; Gastric cancer in her sister; Lung cancer in her brother and sister. No Known Allergies Current Outpatient Medications on File Prior to Visit  Medication Sig Dispense Refill  . ALPRAZolam (XANAX) 0.5 MG tablet TAKE 1 TABLET BY MOUTH ONCE DAILY AT BEDTIME 30 tablet 5  . amLODipine (NORVASC) 10 MG tablet Take 1 tablet (10 mg total) by mouth daily. 90 tablet 3  . B Complex-C-Zn-Folic Acid (DIALYVITE 782 WITH ZINC) 0.8 MG TABS Take 1 tablet by mouth daily.  12  . Cholecalciferol (VITAMIN D3) 1000 UNITS CAPS  Take 1,000 Units by mouth daily.     . Darbepoetin Alfa (ARANESP) 100 MCG/0.5ML SOSY injection Inject 0.5 mLs (100 mcg total) into the vein every Thursday with hemodialysis. 4.2 mL   . folic acid (FOLVITE) 1 MG tablet Take 1 tablet by mouth once daily 90 tablet 0  . hydrALAZINE (APRESOLINE) 100 MG tablet Take 0.5 tablets (50 mg total) by mouth 3 (three) times daily. 270 tablet 3  . ketotifen (ZADITOR) 0.025 % ophthalmic solution Place 1 drop into both eyes 2 (two) times daily.    Marland Kitchen lactulose (CHRONULAC) 10 GM/15ML solution TAKE 30 ML BY MOUTH ONCE  DAILY 473 mL 0  . lidocaine-prilocaine (EMLA) cream Apply 1 application topically daily as needed (use before dialysis).   12  . propranolol (INDERAL) 40 MG tablet Take 1 tablet by mouth twice daily 180 tablet 0  . Propylene Glycol (SYSTANE BALANCE) 0.6 % SOLN Place 1 drop into both eyes daily.     No current facility-administered medications on file prior to visit.   Review of Systems  Constitutional: Negative for other unusual diaphoresis or sweats HENT: Negative for ear discharge or swelling Eyes: Negative for other worsening visual disturbances Respiratory: Negative for stridor or other swelling  Gastrointestinal: Negative for worsening distension or other blood Genitourinary: Negative for retention or other urinary change Musculoskeletal: Negative for other MSK pain or swelling Skin: Negative for color change or other new lesions Neurological: Negative for worsening tremors and other numbness  Psychiatric/Behavioral: Negative for worsening agitation or other fatigue All otherwise neg per pt     Objective:   Physical Exam BP 122/74   Pulse 67   Temp 98.3 F (36.8 C) (Oral)   Ht 5\' 3"  (1.6 m)   Wt 119 lb (54 kg)   SpO2 97%   BMI 21.08 kg/m  VS noted,  Constitutional: Pt appears in NAD HENT: Head: NCAT.  Right Ear: External ear normal.  Left Ear: External ear normal.  Eyes: . Pupils are equal, round, and reactive to light. Conjunctivae and EOM are normal Nose: without d/c or deformity Neck: Neck supple. Gross normal ROM Cardiovascular: Normal rate and regular rhythm.   Pulmonary/Chest: Effort normal and breath sounds without rales or wheezing.  Abd:  Soft, NT, ND, + BS, no organomegaly Neurological: Pt is alert. At baseline orientation, motor grossly intact Skin: Skin is warm. No rashes, other new lesions, no LE edema Psychiatric: Pt behavior is normal without agitation  All otherwise neg per pt   Lab Results  Component Value Date   WBC 4.4 06/06/2019   HGB 11.9  (L) 06/06/2019   HCT 36.7 06/06/2019   PLT 123.0 (L) 06/06/2019   GLUCOSE 99 06/06/2019   CHOL 156 06/06/2019   TRIG 81.0 06/06/2019   HDL 75.30 06/06/2019   LDLDIRECT 85.0 09/04/2017   LDLCALC 65 06/06/2019   ALT 14 06/06/2019   AST 17 06/06/2019   NA 135 06/06/2019   K 3.6 06/06/2019   CL 91 (L) 06/06/2019   CREATININE 4.85 (HH) 06/06/2019   BUN 16 06/06/2019   CO2 31 06/06/2019   TSH 0.96 06/06/2019   INR 0.98 07/14/2018   HGBA1C 5.1 06/06/2019       Assessment & Plan:

## 2019-12-01 NOTE — Telephone Encounter (Signed)
CRITICAL VALUE STICKER  CRITICAL VALUE: creatine 7.0, GFR 6.92  RECEIVER (on-site recipient of call):Wania Longstreth,RN  DATE & TIME NOTIFIED: 12/01/2019 10:53  MESSENGER (representative from lab):Hope/Lab  MD NOTIFIED: Dr Jenny Reichmann  TIME OF NOTIFICATION:12/01/2019 10:54  RESPONSE: pending

## 2019-12-16 ENCOUNTER — Telehealth: Payer: Self-pay | Admitting: Hematology

## 2019-12-16 NOTE — Telephone Encounter (Signed)
Scheduled appt per 12/29 sch message - pt daughter aware of appt date and time

## 2019-12-19 DIAGNOSIS — N186 End stage renal disease: Secondary | ICD-10-CM | POA: Diagnosis not present

## 2019-12-19 DIAGNOSIS — Z992 Dependence on renal dialysis: Secondary | ICD-10-CM | POA: Diagnosis not present

## 2019-12-19 DIAGNOSIS — I129 Hypertensive chronic kidney disease with stage 1 through stage 4 chronic kidney disease, or unspecified chronic kidney disease: Secondary | ICD-10-CM | POA: Diagnosis not present

## 2019-12-21 DIAGNOSIS — D631 Anemia in chronic kidney disease: Secondary | ICD-10-CM | POA: Diagnosis not present

## 2019-12-21 DIAGNOSIS — D509 Iron deficiency anemia, unspecified: Secondary | ICD-10-CM | POA: Diagnosis not present

## 2019-12-21 DIAGNOSIS — N186 End stage renal disease: Secondary | ICD-10-CM | POA: Diagnosis not present

## 2019-12-21 DIAGNOSIS — E876 Hypokalemia: Secondary | ICD-10-CM | POA: Diagnosis not present

## 2019-12-21 DIAGNOSIS — N2581 Secondary hyperparathyroidism of renal origin: Secondary | ICD-10-CM | POA: Diagnosis not present

## 2019-12-21 DIAGNOSIS — Z992 Dependence on renal dialysis: Secondary | ICD-10-CM | POA: Diagnosis not present

## 2019-12-23 ENCOUNTER — Other Ambulatory Visit: Payer: Self-pay | Admitting: Internal Medicine

## 2019-12-23 DIAGNOSIS — N186 End stage renal disease: Secondary | ICD-10-CM | POA: Diagnosis not present

## 2019-12-23 DIAGNOSIS — N2581 Secondary hyperparathyroidism of renal origin: Secondary | ICD-10-CM | POA: Diagnosis not present

## 2019-12-23 DIAGNOSIS — Z992 Dependence on renal dialysis: Secondary | ICD-10-CM | POA: Diagnosis not present

## 2019-12-23 DIAGNOSIS — E876 Hypokalemia: Secondary | ICD-10-CM | POA: Diagnosis not present

## 2019-12-23 DIAGNOSIS — D509 Iron deficiency anemia, unspecified: Secondary | ICD-10-CM | POA: Diagnosis not present

## 2019-12-23 DIAGNOSIS — D631 Anemia in chronic kidney disease: Secondary | ICD-10-CM | POA: Diagnosis not present

## 2019-12-23 NOTE — Telephone Encounter (Signed)
Done erx 

## 2019-12-25 DIAGNOSIS — D631 Anemia in chronic kidney disease: Secondary | ICD-10-CM | POA: Diagnosis not present

## 2019-12-25 DIAGNOSIS — D509 Iron deficiency anemia, unspecified: Secondary | ICD-10-CM | POA: Diagnosis not present

## 2019-12-25 DIAGNOSIS — N186 End stage renal disease: Secondary | ICD-10-CM | POA: Diagnosis not present

## 2019-12-25 DIAGNOSIS — E876 Hypokalemia: Secondary | ICD-10-CM | POA: Diagnosis not present

## 2019-12-25 DIAGNOSIS — Z992 Dependence on renal dialysis: Secondary | ICD-10-CM | POA: Diagnosis not present

## 2019-12-25 DIAGNOSIS — N2581 Secondary hyperparathyroidism of renal origin: Secondary | ICD-10-CM | POA: Diagnosis not present

## 2019-12-27 DIAGNOSIS — Z992 Dependence on renal dialysis: Secondary | ICD-10-CM | POA: Diagnosis not present

## 2019-12-27 DIAGNOSIS — E876 Hypokalemia: Secondary | ICD-10-CM | POA: Diagnosis not present

## 2019-12-27 DIAGNOSIS — N186 End stage renal disease: Secondary | ICD-10-CM | POA: Diagnosis not present

## 2019-12-27 DIAGNOSIS — D631 Anemia in chronic kidney disease: Secondary | ICD-10-CM | POA: Diagnosis not present

## 2019-12-27 DIAGNOSIS — D509 Iron deficiency anemia, unspecified: Secondary | ICD-10-CM | POA: Diagnosis not present

## 2019-12-27 DIAGNOSIS — N2581 Secondary hyperparathyroidism of renal origin: Secondary | ICD-10-CM | POA: Diagnosis not present

## 2019-12-30 DIAGNOSIS — E876 Hypokalemia: Secondary | ICD-10-CM | POA: Diagnosis not present

## 2019-12-30 DIAGNOSIS — N2581 Secondary hyperparathyroidism of renal origin: Secondary | ICD-10-CM | POA: Diagnosis not present

## 2019-12-30 DIAGNOSIS — N186 End stage renal disease: Secondary | ICD-10-CM | POA: Diagnosis not present

## 2019-12-30 DIAGNOSIS — D509 Iron deficiency anemia, unspecified: Secondary | ICD-10-CM | POA: Diagnosis not present

## 2019-12-30 DIAGNOSIS — Z992 Dependence on renal dialysis: Secondary | ICD-10-CM | POA: Diagnosis not present

## 2019-12-30 DIAGNOSIS — D631 Anemia in chronic kidney disease: Secondary | ICD-10-CM | POA: Diagnosis not present

## 2020-01-01 DIAGNOSIS — E876 Hypokalemia: Secondary | ICD-10-CM | POA: Diagnosis not present

## 2020-01-01 DIAGNOSIS — Z992 Dependence on renal dialysis: Secondary | ICD-10-CM | POA: Diagnosis not present

## 2020-01-01 DIAGNOSIS — D631 Anemia in chronic kidney disease: Secondary | ICD-10-CM | POA: Diagnosis not present

## 2020-01-01 DIAGNOSIS — D509 Iron deficiency anemia, unspecified: Secondary | ICD-10-CM | POA: Diagnosis not present

## 2020-01-01 DIAGNOSIS — N186 End stage renal disease: Secondary | ICD-10-CM | POA: Diagnosis not present

## 2020-01-01 DIAGNOSIS — N2581 Secondary hyperparathyroidism of renal origin: Secondary | ICD-10-CM | POA: Diagnosis not present

## 2020-01-03 ENCOUNTER — Other Ambulatory Visit: Payer: Self-pay | Admitting: Internal Medicine

## 2020-01-03 DIAGNOSIS — E876 Hypokalemia: Secondary | ICD-10-CM | POA: Diagnosis not present

## 2020-01-03 DIAGNOSIS — D509 Iron deficiency anemia, unspecified: Secondary | ICD-10-CM | POA: Diagnosis not present

## 2020-01-03 DIAGNOSIS — Z992 Dependence on renal dialysis: Secondary | ICD-10-CM | POA: Diagnosis not present

## 2020-01-03 DIAGNOSIS — N186 End stage renal disease: Secondary | ICD-10-CM | POA: Diagnosis not present

## 2020-01-03 DIAGNOSIS — D631 Anemia in chronic kidney disease: Secondary | ICD-10-CM | POA: Diagnosis not present

## 2020-01-03 DIAGNOSIS — N2581 Secondary hyperparathyroidism of renal origin: Secondary | ICD-10-CM | POA: Diagnosis not present

## 2020-01-03 NOTE — Telephone Encounter (Signed)
Please refill as per office routine med refill policy (all routine meds refilled for 3 mo or monthly per pt preference up to one year from last visit, then month to month grace period for 3 mo, then further med refills will have to be denied)  

## 2020-01-06 DIAGNOSIS — N2581 Secondary hyperparathyroidism of renal origin: Secondary | ICD-10-CM | POA: Diagnosis not present

## 2020-01-06 DIAGNOSIS — Z992 Dependence on renal dialysis: Secondary | ICD-10-CM | POA: Diagnosis not present

## 2020-01-06 DIAGNOSIS — D509 Iron deficiency anemia, unspecified: Secondary | ICD-10-CM | POA: Diagnosis not present

## 2020-01-06 DIAGNOSIS — E876 Hypokalemia: Secondary | ICD-10-CM | POA: Diagnosis not present

## 2020-01-06 DIAGNOSIS — D631 Anemia in chronic kidney disease: Secondary | ICD-10-CM | POA: Diagnosis not present

## 2020-01-06 DIAGNOSIS — N186 End stage renal disease: Secondary | ICD-10-CM | POA: Diagnosis not present

## 2020-01-08 DIAGNOSIS — D631 Anemia in chronic kidney disease: Secondary | ICD-10-CM | POA: Diagnosis not present

## 2020-01-08 DIAGNOSIS — N186 End stage renal disease: Secondary | ICD-10-CM | POA: Diagnosis not present

## 2020-01-08 DIAGNOSIS — N2581 Secondary hyperparathyroidism of renal origin: Secondary | ICD-10-CM | POA: Diagnosis not present

## 2020-01-08 DIAGNOSIS — D509 Iron deficiency anemia, unspecified: Secondary | ICD-10-CM | POA: Diagnosis not present

## 2020-01-08 DIAGNOSIS — Z992 Dependence on renal dialysis: Secondary | ICD-10-CM | POA: Diagnosis not present

## 2020-01-08 DIAGNOSIS — E876 Hypokalemia: Secondary | ICD-10-CM | POA: Diagnosis not present

## 2020-01-10 DIAGNOSIS — N2581 Secondary hyperparathyroidism of renal origin: Secondary | ICD-10-CM | POA: Diagnosis not present

## 2020-01-10 DIAGNOSIS — D631 Anemia in chronic kidney disease: Secondary | ICD-10-CM | POA: Diagnosis not present

## 2020-01-10 DIAGNOSIS — D509 Iron deficiency anemia, unspecified: Secondary | ICD-10-CM | POA: Diagnosis not present

## 2020-01-10 DIAGNOSIS — Z992 Dependence on renal dialysis: Secondary | ICD-10-CM | POA: Diagnosis not present

## 2020-01-10 DIAGNOSIS — E876 Hypokalemia: Secondary | ICD-10-CM | POA: Diagnosis not present

## 2020-01-10 DIAGNOSIS — N186 End stage renal disease: Secondary | ICD-10-CM | POA: Diagnosis not present

## 2020-01-13 DIAGNOSIS — D631 Anemia in chronic kidney disease: Secondary | ICD-10-CM | POA: Diagnosis not present

## 2020-01-13 DIAGNOSIS — E876 Hypokalemia: Secondary | ICD-10-CM | POA: Diagnosis not present

## 2020-01-13 DIAGNOSIS — N2581 Secondary hyperparathyroidism of renal origin: Secondary | ICD-10-CM | POA: Diagnosis not present

## 2020-01-13 DIAGNOSIS — Z992 Dependence on renal dialysis: Secondary | ICD-10-CM | POA: Diagnosis not present

## 2020-01-13 DIAGNOSIS — D509 Iron deficiency anemia, unspecified: Secondary | ICD-10-CM | POA: Diagnosis not present

## 2020-01-13 DIAGNOSIS — N186 End stage renal disease: Secondary | ICD-10-CM | POA: Diagnosis not present

## 2020-01-15 DIAGNOSIS — E876 Hypokalemia: Secondary | ICD-10-CM | POA: Diagnosis not present

## 2020-01-15 DIAGNOSIS — D631 Anemia in chronic kidney disease: Secondary | ICD-10-CM | POA: Diagnosis not present

## 2020-01-15 DIAGNOSIS — N2581 Secondary hyperparathyroidism of renal origin: Secondary | ICD-10-CM | POA: Diagnosis not present

## 2020-01-15 DIAGNOSIS — Z992 Dependence on renal dialysis: Secondary | ICD-10-CM | POA: Diagnosis not present

## 2020-01-15 DIAGNOSIS — N186 End stage renal disease: Secondary | ICD-10-CM | POA: Diagnosis not present

## 2020-01-15 DIAGNOSIS — D509 Iron deficiency anemia, unspecified: Secondary | ICD-10-CM | POA: Diagnosis not present

## 2020-01-17 DIAGNOSIS — N2581 Secondary hyperparathyroidism of renal origin: Secondary | ICD-10-CM | POA: Diagnosis not present

## 2020-01-17 DIAGNOSIS — N186 End stage renal disease: Secondary | ICD-10-CM | POA: Diagnosis not present

## 2020-01-17 DIAGNOSIS — Z992 Dependence on renal dialysis: Secondary | ICD-10-CM | POA: Diagnosis not present

## 2020-01-17 DIAGNOSIS — E876 Hypokalemia: Secondary | ICD-10-CM | POA: Diagnosis not present

## 2020-01-17 DIAGNOSIS — D631 Anemia in chronic kidney disease: Secondary | ICD-10-CM | POA: Diagnosis not present

## 2020-01-17 DIAGNOSIS — D509 Iron deficiency anemia, unspecified: Secondary | ICD-10-CM | POA: Diagnosis not present

## 2020-01-19 ENCOUNTER — Other Ambulatory Visit: Payer: Self-pay | Admitting: Internal Medicine

## 2020-01-19 DIAGNOSIS — N186 End stage renal disease: Secondary | ICD-10-CM | POA: Diagnosis not present

## 2020-01-19 DIAGNOSIS — I129 Hypertensive chronic kidney disease with stage 1 through stage 4 chronic kidney disease, or unspecified chronic kidney disease: Secondary | ICD-10-CM | POA: Diagnosis not present

## 2020-01-19 DIAGNOSIS — I871 Compression of vein: Secondary | ICD-10-CM | POA: Diagnosis not present

## 2020-01-19 DIAGNOSIS — Z992 Dependence on renal dialysis: Secondary | ICD-10-CM | POA: Diagnosis not present

## 2020-01-19 DIAGNOSIS — T82858A Stenosis of vascular prosthetic devices, implants and grafts, initial encounter: Secondary | ICD-10-CM | POA: Diagnosis not present

## 2020-01-20 DIAGNOSIS — E876 Hypokalemia: Secondary | ICD-10-CM | POA: Diagnosis not present

## 2020-01-20 DIAGNOSIS — N186 End stage renal disease: Secondary | ICD-10-CM | POA: Diagnosis not present

## 2020-01-20 DIAGNOSIS — Z992 Dependence on renal dialysis: Secondary | ICD-10-CM | POA: Diagnosis not present

## 2020-01-20 DIAGNOSIS — N2581 Secondary hyperparathyroidism of renal origin: Secondary | ICD-10-CM | POA: Diagnosis not present

## 2020-01-20 DIAGNOSIS — D631 Anemia in chronic kidney disease: Secondary | ICD-10-CM | POA: Diagnosis not present

## 2020-01-20 DIAGNOSIS — D509 Iron deficiency anemia, unspecified: Secondary | ICD-10-CM | POA: Diagnosis not present

## 2020-01-22 DIAGNOSIS — D509 Iron deficiency anemia, unspecified: Secondary | ICD-10-CM | POA: Diagnosis not present

## 2020-01-22 DIAGNOSIS — E876 Hypokalemia: Secondary | ICD-10-CM | POA: Diagnosis not present

## 2020-01-22 DIAGNOSIS — D631 Anemia in chronic kidney disease: Secondary | ICD-10-CM | POA: Diagnosis not present

## 2020-01-22 DIAGNOSIS — N2581 Secondary hyperparathyroidism of renal origin: Secondary | ICD-10-CM | POA: Diagnosis not present

## 2020-01-22 DIAGNOSIS — Z992 Dependence on renal dialysis: Secondary | ICD-10-CM | POA: Diagnosis not present

## 2020-01-22 DIAGNOSIS — N186 End stage renal disease: Secondary | ICD-10-CM | POA: Diagnosis not present

## 2020-01-24 DIAGNOSIS — E876 Hypokalemia: Secondary | ICD-10-CM | POA: Diagnosis not present

## 2020-01-24 DIAGNOSIS — N186 End stage renal disease: Secondary | ICD-10-CM | POA: Diagnosis not present

## 2020-01-24 DIAGNOSIS — D631 Anemia in chronic kidney disease: Secondary | ICD-10-CM | POA: Diagnosis not present

## 2020-01-24 DIAGNOSIS — D509 Iron deficiency anemia, unspecified: Secondary | ICD-10-CM | POA: Diagnosis not present

## 2020-01-24 DIAGNOSIS — N2581 Secondary hyperparathyroidism of renal origin: Secondary | ICD-10-CM | POA: Diagnosis not present

## 2020-01-24 DIAGNOSIS — Z992 Dependence on renal dialysis: Secondary | ICD-10-CM | POA: Diagnosis not present

## 2020-01-25 ENCOUNTER — Other Ambulatory Visit: Payer: Self-pay | Admitting: Internal Medicine

## 2020-01-27 DIAGNOSIS — N2581 Secondary hyperparathyroidism of renal origin: Secondary | ICD-10-CM | POA: Diagnosis not present

## 2020-01-27 DIAGNOSIS — D631 Anemia in chronic kidney disease: Secondary | ICD-10-CM | POA: Diagnosis not present

## 2020-01-27 DIAGNOSIS — N186 End stage renal disease: Secondary | ICD-10-CM | POA: Diagnosis not present

## 2020-01-27 DIAGNOSIS — D509 Iron deficiency anemia, unspecified: Secondary | ICD-10-CM | POA: Diagnosis not present

## 2020-01-27 DIAGNOSIS — Z992 Dependence on renal dialysis: Secondary | ICD-10-CM | POA: Diagnosis not present

## 2020-01-27 DIAGNOSIS — E876 Hypokalemia: Secondary | ICD-10-CM | POA: Diagnosis not present

## 2020-01-29 DIAGNOSIS — E876 Hypokalemia: Secondary | ICD-10-CM | POA: Diagnosis not present

## 2020-01-29 DIAGNOSIS — D631 Anemia in chronic kidney disease: Secondary | ICD-10-CM | POA: Diagnosis not present

## 2020-01-29 DIAGNOSIS — N186 End stage renal disease: Secondary | ICD-10-CM | POA: Diagnosis not present

## 2020-01-29 DIAGNOSIS — D509 Iron deficiency anemia, unspecified: Secondary | ICD-10-CM | POA: Diagnosis not present

## 2020-01-29 DIAGNOSIS — Z992 Dependence on renal dialysis: Secondary | ICD-10-CM | POA: Diagnosis not present

## 2020-01-29 DIAGNOSIS — N2581 Secondary hyperparathyroidism of renal origin: Secondary | ICD-10-CM | POA: Diagnosis not present

## 2020-01-31 DIAGNOSIS — N2581 Secondary hyperparathyroidism of renal origin: Secondary | ICD-10-CM | POA: Diagnosis not present

## 2020-01-31 DIAGNOSIS — E876 Hypokalemia: Secondary | ICD-10-CM | POA: Diagnosis not present

## 2020-01-31 DIAGNOSIS — Z992 Dependence on renal dialysis: Secondary | ICD-10-CM | POA: Diagnosis not present

## 2020-01-31 DIAGNOSIS — D509 Iron deficiency anemia, unspecified: Secondary | ICD-10-CM | POA: Diagnosis not present

## 2020-01-31 DIAGNOSIS — N186 End stage renal disease: Secondary | ICD-10-CM | POA: Diagnosis not present

## 2020-01-31 DIAGNOSIS — D631 Anemia in chronic kidney disease: Secondary | ICD-10-CM | POA: Diagnosis not present

## 2020-02-03 DIAGNOSIS — Z992 Dependence on renal dialysis: Secondary | ICD-10-CM | POA: Diagnosis not present

## 2020-02-03 DIAGNOSIS — D631 Anemia in chronic kidney disease: Secondary | ICD-10-CM | POA: Diagnosis not present

## 2020-02-03 DIAGNOSIS — N2581 Secondary hyperparathyroidism of renal origin: Secondary | ICD-10-CM | POA: Diagnosis not present

## 2020-02-03 DIAGNOSIS — N186 End stage renal disease: Secondary | ICD-10-CM | POA: Diagnosis not present

## 2020-02-03 DIAGNOSIS — E876 Hypokalemia: Secondary | ICD-10-CM | POA: Diagnosis not present

## 2020-02-03 DIAGNOSIS — D509 Iron deficiency anemia, unspecified: Secondary | ICD-10-CM | POA: Diagnosis not present

## 2020-02-05 DIAGNOSIS — D509 Iron deficiency anemia, unspecified: Secondary | ICD-10-CM | POA: Diagnosis not present

## 2020-02-05 DIAGNOSIS — D631 Anemia in chronic kidney disease: Secondary | ICD-10-CM | POA: Diagnosis not present

## 2020-02-05 DIAGNOSIS — N2581 Secondary hyperparathyroidism of renal origin: Secondary | ICD-10-CM | POA: Diagnosis not present

## 2020-02-05 DIAGNOSIS — E876 Hypokalemia: Secondary | ICD-10-CM | POA: Diagnosis not present

## 2020-02-05 DIAGNOSIS — Z992 Dependence on renal dialysis: Secondary | ICD-10-CM | POA: Diagnosis not present

## 2020-02-05 DIAGNOSIS — N186 End stage renal disease: Secondary | ICD-10-CM | POA: Diagnosis not present

## 2020-02-07 DIAGNOSIS — D631 Anemia in chronic kidney disease: Secondary | ICD-10-CM | POA: Diagnosis not present

## 2020-02-07 DIAGNOSIS — N186 End stage renal disease: Secondary | ICD-10-CM | POA: Diagnosis not present

## 2020-02-07 DIAGNOSIS — D509 Iron deficiency anemia, unspecified: Secondary | ICD-10-CM | POA: Diagnosis not present

## 2020-02-07 DIAGNOSIS — Z992 Dependence on renal dialysis: Secondary | ICD-10-CM | POA: Diagnosis not present

## 2020-02-07 DIAGNOSIS — N2581 Secondary hyperparathyroidism of renal origin: Secondary | ICD-10-CM | POA: Diagnosis not present

## 2020-02-07 DIAGNOSIS — E876 Hypokalemia: Secondary | ICD-10-CM | POA: Diagnosis not present

## 2020-02-10 DIAGNOSIS — N2581 Secondary hyperparathyroidism of renal origin: Secondary | ICD-10-CM | POA: Diagnosis not present

## 2020-02-10 DIAGNOSIS — N186 End stage renal disease: Secondary | ICD-10-CM | POA: Diagnosis not present

## 2020-02-10 DIAGNOSIS — D631 Anemia in chronic kidney disease: Secondary | ICD-10-CM | POA: Diagnosis not present

## 2020-02-10 DIAGNOSIS — Z992 Dependence on renal dialysis: Secondary | ICD-10-CM | POA: Diagnosis not present

## 2020-02-10 DIAGNOSIS — E876 Hypokalemia: Secondary | ICD-10-CM | POA: Diagnosis not present

## 2020-02-10 DIAGNOSIS — D509 Iron deficiency anemia, unspecified: Secondary | ICD-10-CM | POA: Diagnosis not present

## 2020-02-12 DIAGNOSIS — Z992 Dependence on renal dialysis: Secondary | ICD-10-CM | POA: Diagnosis not present

## 2020-02-12 DIAGNOSIS — N2581 Secondary hyperparathyroidism of renal origin: Secondary | ICD-10-CM | POA: Diagnosis not present

## 2020-02-12 DIAGNOSIS — D631 Anemia in chronic kidney disease: Secondary | ICD-10-CM | POA: Diagnosis not present

## 2020-02-12 DIAGNOSIS — E876 Hypokalemia: Secondary | ICD-10-CM | POA: Diagnosis not present

## 2020-02-12 DIAGNOSIS — D509 Iron deficiency anemia, unspecified: Secondary | ICD-10-CM | POA: Diagnosis not present

## 2020-02-12 DIAGNOSIS — N186 End stage renal disease: Secondary | ICD-10-CM | POA: Diagnosis not present

## 2020-02-12 NOTE — Progress Notes (Signed)
[place]   [phone] [phone]   Clinic Follow up Note   Patient Care Team: Biagio Borg, MD as PCP - Charissa Bash, MD as Consulting Physician (Hematology) Corliss Parish, MD as Consulting Physician (Nephrology)  Date of Service:  02/18/2020  CHIEF COMPLAINT: Follow up stage I Hepatocellular carcinoma  SUMMARY OF ONCOLOGIC HISTORY: Oncology History Overview Note  Hepatocellular carcinoma   Staging form: Liver (Excluding Intrahepatic Bile Ducts), AJCC 7th Edition     Clinical: Stage I (T1, N0, M0) - Unsigned     Hepatocellular carcinoma ([place])  03/01/2015 Tumor Marker   AFP 8.4   03/01/2015 Imaging   Liver MRI showed home 1.5 cm hyperenhancing T2 bright lesion within the right hepatic lobe, consistent with hepatocellular carcinoma. There is an indeterminate 1cm T2 bright lesion within the hepatic dome, w/o definitive arterial phase enhancement    03/01/2015 Initial Diagnosis   Hepatocellular carcinoma   05/14/2015 Procedure   CT guided thermal ablation of hepatocellular carcinoma   12/24/2015 Procedure   Upper Endoscopy: 12/24/15 ENDOSCOPIC IMPRESSION: There was mild to moderate erosive gastritis (mid and distal stomach). This was not classic appearing for portal gastropathy. Biopsies were taken from antrum and body to check for H. pylori, portal gastropathy, GAVE. The examination was otherwise normal. There were no gastric or esophageal varices   12/24/2015 Procedure   Colonoscopy: 12/24/15 ENDOSCOPIC IMPRESSION:  1. Mild diverticulosis was noted in the left colon 2. The examination was otherwise normal RADIOGRAPHIC STUDIES: I have personally reviewed the radiological images as listed and agreed with the findings in the report.    01/25/2016 Imaging   liver MRI with and without contrast showed further decreased in size of ablation defect within the segment 8 of the liver compatible with treated tumor,no new enhancing liver  lesions identified. Liver cirrhosis.   09/06/2016 Imaging    MRI ABD 09/06/16 IMPRESSION: 1. Minimal further size reduction at the ablation site, no recurrence or new focus of hepatocellular carcinoma identified. 2. Complex cystic lesion of the left kidney upper pole with some thin internal septations but no measurable enhancement, has minimally increased in size over the past 9 years. High likelihood that this is a benign lesion, categorized as Bosniak category IIF in the past. I would suggest ancillary surveillance at the time of liver follow up. 3. Mild chronic gallbladder wall thickening, with a stone lodged in the neck of the gallbladder. Mild chronic gallbladder distention.    05/15/2017 Imaging   MRI Abdomen WO Contrast 05/15/17 IMPRESSION: Ablation zone in segment 8 is favored to have mildly decreased in size, now measuring 1.4 cm. This is incompletely evaluated in the absence of intravenous contrast. Otherwise, no focal hepatic lesion is seen. Suspected mild cirrhosis. 4.0 x 3.2 cm complex/septated left upper pole renal cyst, incompletely evaluated, but grossly unchanged.   10/31/2017 Imaging   MRI Abdomen WO Contrast 10/31/17  IMPRESSION: 1. Stable appearance of the ablation defect in segment VIII of the liver. No new liver abnormality on the current study. 2. Subtle nodularity of hepatic contour suggests cirrhosis. 3. Gallbladder distension with gallstone again noted lodged in the gallbladder neck/cystic duct. No associated biliary dilatation. 4. Stable appearance complex/septated cyst upper pole left kidney on this noncontrast exam. Lesion remains compatible with Bosniak II F lesion as previously characterized.   08/08/2018 Imaging   MRI abdomen  IMPRESSION: 1. Stable 1.8 by 1.1 cm triangular peripheral focus of T2 hyperintensity at the site of prior ablation, with slight  retraction of the overlying liver capsule. This likely represents fibrosis related to the  ablation site. I do not see a more targetoid, rounded, or progressive appearance to suggest local recurrence, although clearly the lack of IV contrast reduces sensitivity. 2. Hepatic cirrhosis with dropout of signal on inphase images favoring hemochromatosis. 3. 11 mm gallstone in the neck of the gallbladder. 4. Sludge in the gallbladder with a new 7 mm filling defect along the nondependent gallbladder wall. This is probably from tumefactive sludge or tear gallstone, and less likely to be a new gallbladder polyp. Surveillance suggested. 5. Stable Bosniak category 52F lesion of the left kidney upper pole medially. We have now documented 3 years of stability. At least annual follow up is recommended for 2 more years. There is also a very small suspected hemorrhagic cyst of the left mid kidney. 6. Asymmetric edema in the left breast and left upper arm. Mastitis is not excluded. 7. Other imaging findings of potential clinical significance: Descending colon diverticulosis. Aortic Atherosclerosis (ICD10-I70.0). Diffuse mesenteric edema. Subcutaneous edema along the lower back. Levoconvex lumbar scoliosis   11/15/2018 Imaging   MRI abdomen  IMPRESSION: Stable post ablation changes in segment 8. No additional focal hepatic lesions on unenhanced MRI.   Cirrhosis. Iron deposition in the liver and spleen, suggesting hemochromatosis.   Cholelithiasis with gallbladder distension, including a 13 mm gallstone in the gallbladder neck. No definite findings to suggest acute cholecystitis.   4.1 cm complex left upper pole renal cyst, grossly unchanged. By definition, this reflects a probable Bosniak III lesion, although this remains incompletely characterized in the absence of intravenous contrast.   09/24/2019 Imaging   MRI abdomen  IMPRESSION: 1. Stable appearance of the segment VIII hepatic ablation defect. No new focal signal abnormality in the liver parenchyma although detection of new  hepatic mass lesion is limited by inability to administer intravenous contrast material. 2. 3.3 cm irregular filling defect in the gallbladder lumen, potentially tumefactive sludge, but soft tissue lesion not excluded. Right upper quadrant abdominal ultrasound recommended to further evaluate. 3. Nodular hepatic contour compatible with cirrhosis. 4. Loss of signal intensity in the liver and spleen suggesting iron deposition. 5. 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 Continued close attention on follow-up recommended.     10/20/2019 Imaging   US Abdomen  IMPRESSION: 1. Extensive inspissated sludge throughout the gallbladder. No gallstones are evident. It must be cautioned that gallstones could easily be obscured by this degree of sludge. No gallbladder wall thickening or pericholecystic fluid.   2. Liver echotexture is homogeneous overall increased, an appearance indicative of hepatic steatosis. While no focal liver lesions are evident on this study, must be cautioned that the sensitivity of ultrasound for detection of focal liver lesions is diminished in this circumstance.      CURRENT THERAPY:  Surveillance  INTERVAL HISTORY:  Sarah Harmon is here for a follow up of early stage liver cancer. She was last seen by me in 05/2018. She presents to the clinic with her daughter. She notes she is doing well. She is still on Dialysis three times a week which she is managing well. She denies abdominal bloating or pain or GI or vomiting blood. She has not seen Dawn Drazek in a while. Her last endoscopy with Dr Ardis Hughs was in 2017.  She is able to do home chores at home and be independent at home. She feels her energy level is adequate. She has been  able to gain weight lately.     REVIEW OF SYSTEMS:   Constitutional: Denies fevers, chills or abnormal weight loss Eyes: Denies blurriness of vision Ears, nose,  mouth, throat, and face: Denies mucositis or sore throat Respiratory: Denies cough, dyspnea or wheezes Cardiovascular: Denies palpitation, chest discomfort or lower extremity swelling Gastrointestinal:  Denies nausea, heartburn or change in bowel habits Skin: Denies abnormal skin rashes Lymphatics: Denies new lymphadenopathy or easy bruising Neurological:Denies numbness, tingling or new weaknesses Behavioral/Psych: Mood is stable, no new changes  All other systems were reviewed with the patient and are negative.  MEDICAL HISTORY:  Past Medical History:  Diagnosis Date  . ALCOHOL ABUSE 07/13/2010  . ANXIETY 12/22/2009  . Cancer [facility])    liver cancer  . DEPRESSION 12/22/2009  . 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 DJD (degenerative joint disease), cervical    patient denies on preop of 05/10/15   . FATIGUE 12/23/2010  . GALLSTONES 12/22/2009  . Gallstones   . Headache    occasional   . HYPERLIPIDEMIA 12/22/2009  . HYPERTENSION 12/22/2009  . HYPONATREMIA 12/22/2009  . 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 OSTEOPENIA 12/22/2009  . Pancytopenia 07/13/2010  . RENAL CYST, LEFT 12/22/2009  . VITAMIN D DEFICIENCY 12/23/2010  . WEIGHT LOSS 12/23/2010    SURGICAL HISTORY: Past Surgical History:  Procedure Laterality Date  . A/V FISTULAGRAM N/A 10/25/2018   Procedure: A/V FISTULAGRAM;  Surgeon: Elam Dutch, MD;  Location: [place] CV LAB;  Service: Cardiovascular;  Laterality: N/A;  . ABDOMINAL HYSTERECTOMY    . AV FISTULA PLACEMENT Left 05/21/2018   Procedure: INSERTION OF ARTERIOVENOUS (AV) GORE-TEX GRAFT UPPER ARM;  Surgeon: Elam Dutch, MD;  Location: [place] OR;  Service: Vascular;  Laterality: Left;  . AV FISTULA PLACEMENT Left 06/03/2018   Procedure: INSERTION OF ARTERIOVENOUS (AV) GORE-TEX 5m x 10cm GRAFT INTO LEFT ARM;  Surgeon: FElam Dutch MD;  [place]  Service: Vascular;  Laterality: Left;  . IR GENERIC HISTORICAL  10/18/2016   IR RADIOLOGIST EVAL & MGMT 10/18/2016  GAletta Edouard MD [facility] INTERV RAD  . IR RADIOLOGIST EVAL & MGMT  05/30/2017  . IR RADIOLOGIST EVAL & MGMT  08/28/2018  . IR RADIOLOGIST EVAL & MGMT  10/08/2019  . IR RADIOLOGIST EVAL & MGMT  10/30/2019  . surgery for liver ca    . THROMBECTOMY AND REVISION OF ARTERIOVENTOUS (AV) GORETEX  GRAFT Left 06/03/2018   Procedure: THROMBECTOMY AND REVISION OF ARTERIOVENTOUS (AV) GORETEX  GRAFT ARM;  Surgeon: FElam Dutch MD;  [place]  Service: Vascular;  Laterality: Left;    I have reviewed the social history and family history with the patient and they are unchanged from previous note.  ALLERGIES:  has No Known Allergies.  MEDICATIONS:  Current Outpatient Medications  Medication Sig Dispense Refill  . ALPRAZolam (XANAX) 0.5 MG tablet TAKE 1 TABLET BY MOUTH ONCE DAILY AT BEDTIME 30 tablet 5  . amLODipine (NORVASC) 10 MG tablet Take 1 tablet (10 mg total) by mouth daily. 90 tablet 3  . B Complex-C-Zn-Folic Acid (DIALYVITE 8623WITH ZINC) 0.8 MG TABS Take 1 tablet by mouth daily.  12  . Cholecalciferol (VITAMIN D3) 1000 UNITS CAPS Take 1,000 Units by mouth daily.     . CONSTULOSE 10 GM/15ML solution TAKE 30 MILLILITERS BY MOUTH ONCE DAILY 473 mL 0  . Darbepoetin Alfa (ARANESP) 100 MCG/0.5ML SOSY injection Inject 0.5 mLs (100 mcg total) into the vein every Thursday with hemodialysis.  4.2 mL   . folic acid (FOLVITE) 1 MG tablet Take 1 tablet by mouth once daily 90 tablet 2  . hydrALAZINE (APRESOLINE) 100 MG tablet TAKE 1 TABLET BY MOUTH THREE TIMES DAILY 270 tablet 2  . ketotifen (ZADITOR) 0.025 % ophthalmic solution Place 1 drop into both eyes 2 (two) times daily.    Marland Kitchen lidocaine-prilocaine (EMLA) cream Apply 1 application topically daily as needed (use before dialysis).   12  . propranolol (INDERAL) 40 MG tablet Take 1 tablet by mouth twice daily 180 tablet 0  . Propylene Glycol (SYSTANE BALANCE) 0.6 % SOLN Place 1 drop into both eyes daily.     No current facility-administered  medications for this visit.    PHYSICAL EXAMINATION: ECOG PERFORMANCE STATUS: 1 - Symptomatic but completely ambulatory  Vitals:   02/18/20 1058  BP: (!) 147/64  Pulse: 61  Resp: 17  Temp: 98.2 F (36.8 C)  SpO2: 100%   Filed Weights   02/18/20 1058  Weight: 117 lb 11.2 oz (53.4 kg)    GENERAL:alert, no distress and comfortable SKIN: skin color, texture, turgor are normal, no rashes or significant lesions EYES: normal, Conjunctiva are pink and non-injected, sclera clear  NECK: supple, thyroid normal size, non-tender, without nodularity LYMPH:  no palpable lymphadenopathy in the cervical, axillary  LUNGS: clear to auscultation and percussion with normal breathing effort HEART: regular rate & rhythm and no murmurs and no lower extremity edema ABDOMEN:abdomen soft, non-tender and normal bowel sounds Musculoskeletal:no cyanosis of digits and no clubbing  NEURO: alert & oriented x 3 with fluent speech, no focal motor/sensory deficits  LABORATORY DATA:  I have reviewed the data as listed CBC Latest Ref Rng & Units 02/18/2020 12/01/2019 06/06/2019  WBC 4.0 - 10.5 K/uL 3.0(L) 4.0 4.4  Hemoglobin 12.0 - 15.0 g/dL 11.5(L) 8.8 Repeated and verified X2.(L) 11.9(L)  Hematocrit 36.0 - 46.0 % 36.6 26.7(L) 36.7  Platelets 150 - 400 K/uL 57(L) 89.0(L) 123.0(L)     CMP Latest Ref Rng & Units 02/18/2020 12/01/2019 06/06/2019  Glucose 70 - 99 mg/dL 113(H) 94 99  BUN 8 - 23 mg/dL 21 28(H) 16  Creatinine 0.44 - 1.00 mg/dL 5.66(HH) 7.00(HH) 4.85(HH)  Sodium 135 - 145 mmol/L 134(L) 139 135  Potassium 3.5 - 5.1 mmol/L 4.1 3.9 3.6  Chloride 98 - 111 mmol/L 96(L) 98 91(L)  CO2 22 - 32 mmol/L '30 31 31  '$ Calcium 8.9 - 10.3 mg/dL 9.4 9.4 10.2  Total Protein 6.5 - 8.1 g/dL 7.9 7.3 8.3  Total Bilirubin 0.3 - 1.2 mg/dL 0.6 0.5 0.7  Alkaline Phos 38 - 126 U/L 143(H) 123(H) 111  AST 15 - 41 U/L '16 15 17  '$ ALT 0 - 44 U/L '10 8 14      '$ RADIOGRAPHIC STUDIES: I have personally reviewed the radiological  images as listed and agreed with the findings in the report. No results found.   ASSESSMENT & PLAN:  EMMER LILLIBRIDGE is a 74 y.o. female with    1. Stage I hepatocellular carcinoma  -I previously reviewed her liver MRI scan images with her and her family members in great detail. The 1.5 cm liver lesion in right lobe has typical arterial enhancement and venous washout, is consistent with HCC, in the background of liver cirrhosis. The other 1.0 centimeter lesion does not have typical arterial enhancement, and is indeterminate.  -Her liver MRI was diagnostic for [facility], she did not need liver biopsy  -She is status-post CT-guided microwave ablation by Dr.  Yamagata in May 2016, and had a good response. -No new concerning liver recurrence or progression on subsequent MRIs, last in 09/2019. Her MRI and [place] of 2020 showed sludge in her Gallbladder, no gallstones.  -She has been doing well clinically. Labs reviewed, WBC 3, Hg 11.5, PLT 57K, BG 113, Alk Phos 143, CKD. AFP still pending. Physical exam unremarkable. She is 5 years since diagnosis and treatment. She will continue to f/u with yearly scans.  -F/u in 1 year    2. 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 Her last endoscopy was in 12/2015. I recommend she f/u with Dr. Ardis Hughs this year to see if she needs to repeat endoscopy sooner.  -She has not been seen by [facility], I recommend she f/u in the next 3-6 months and continue seeing her yearly.   3. ESRD on Dialysis  -She was hospitalized for worsening renal function in September 2018.  -She had a arteriovenous graph placed in her upper left arm on 05/21/18 and has been on dialysis in 07/2018 three times a week. Follow-up with her nephrologist  4. Hypertension -Continue to f/u with PCP  5. Left DVT  -Occurred in 07/2018. No longer on anticoagulation.   6. pancytopenia -Secondary to liver cirrhosis and  splenomegaly -stable, will monitor  -She knows to call me if she has bleeding, or need surgery. Will offer TPO before her surgery to improve her thrombocytopenia.  PLAN: -Send message to [facility], Dr. Ardis Hughs and copy note to Dr. Kathlene Cote.  -Lab and F/u in 1 year     No problem-specific Assessment & Plan notes found for this encounter.   No orders of the defined types were placed in this encounter.  All questions were answered. The patient knows to call the clinic with any problems, questions or concerns. No barriers to learning was detected.      Truitt Merle, MD 02/18/2020   I, Joslyn Devon, am acting as scribe for Truitt Merle, MD.   I have reviewed the above documentation for accuracy and completeness, and I agree with the above.

## 2020-02-13 ENCOUNTER — Other Ambulatory Visit: Payer: Self-pay | Admitting: Internal Medicine

## 2020-02-14 DIAGNOSIS — Z992 Dependence on renal dialysis: Secondary | ICD-10-CM | POA: Diagnosis not present

## 2020-02-14 DIAGNOSIS — N2581 Secondary hyperparathyroidism of renal origin: Secondary | ICD-10-CM | POA: Diagnosis not present

## 2020-02-14 DIAGNOSIS — D631 Anemia in chronic kidney disease: Secondary | ICD-10-CM | POA: Diagnosis not present

## 2020-02-14 DIAGNOSIS — N186 End stage renal disease: Secondary | ICD-10-CM | POA: Diagnosis not present

## 2020-02-14 DIAGNOSIS — D509 Iron deficiency anemia, unspecified: Secondary | ICD-10-CM | POA: Diagnosis not present

## 2020-02-14 DIAGNOSIS — E876 Hypokalemia: Secondary | ICD-10-CM | POA: Diagnosis not present

## 2020-02-16 DIAGNOSIS — I129 Hypertensive chronic kidney disease with stage 1 through stage 4 chronic kidney disease, or unspecified chronic kidney disease: Secondary | ICD-10-CM | POA: Diagnosis not present

## 2020-02-16 DIAGNOSIS — Z992 Dependence on renal dialysis: Secondary | ICD-10-CM | POA: Diagnosis not present

## 2020-02-16 DIAGNOSIS — N186 End stage renal disease: Secondary | ICD-10-CM | POA: Diagnosis not present

## 2020-02-17 DIAGNOSIS — N186 End stage renal disease: Secondary | ICD-10-CM | POA: Diagnosis not present

## 2020-02-17 DIAGNOSIS — D631 Anemia in chronic kidney disease: Secondary | ICD-10-CM | POA: Diagnosis not present

## 2020-02-17 DIAGNOSIS — Z992 Dependence on renal dialysis: Secondary | ICD-10-CM | POA: Diagnosis not present

## 2020-02-17 DIAGNOSIS — N2581 Secondary hyperparathyroidism of renal origin: Secondary | ICD-10-CM | POA: Diagnosis not present

## 2020-02-17 DIAGNOSIS — E876 Hypokalemia: Secondary | ICD-10-CM | POA: Diagnosis not present

## 2020-02-17 DIAGNOSIS — D509 Iron deficiency anemia, unspecified: Secondary | ICD-10-CM | POA: Diagnosis not present

## 2020-02-17 DIAGNOSIS — Z23 Encounter for immunization: Secondary | ICD-10-CM | POA: Diagnosis not present

## 2020-02-18 ENCOUNTER — Inpatient Hospital Stay: Payer: Medicare Other

## 2020-02-18 ENCOUNTER — Inpatient Hospital Stay: Payer: Medicare Other | Attending: Hematology | Admitting: Hematology

## 2020-02-18 ENCOUNTER — Encounter: Payer: Self-pay | Admitting: Hematology

## 2020-02-18 ENCOUNTER — Other Ambulatory Visit: Payer: Self-pay

## 2020-02-18 VITALS — BP 147/64 | HR 61 | Temp 98.2°F | Resp 17 | Ht 63.0 in | Wt 117.7 lb

## 2020-02-18 DIAGNOSIS — Z86718 Personal history of other venous thrombosis and embolism: Secondary | ICD-10-CM | POA: Insufficient documentation

## 2020-02-18 DIAGNOSIS — D61818 Other pancytopenia: Secondary | ICD-10-CM | POA: Diagnosis not present

## 2020-02-18 DIAGNOSIS — C220 Liver cell carcinoma: Secondary | ICD-10-CM

## 2020-02-18 DIAGNOSIS — E785 Hyperlipidemia, unspecified: Secondary | ICD-10-CM | POA: Diagnosis not present

## 2020-02-18 DIAGNOSIS — K7030 Alcoholic cirrhosis of liver without ascites: Secondary | ICD-10-CM | POA: Diagnosis not present

## 2020-02-18 DIAGNOSIS — Z79899 Other long term (current) drug therapy: Secondary | ICD-10-CM | POA: Insufficient documentation

## 2020-02-18 DIAGNOSIS — I10 Essential (primary) hypertension: Secondary | ICD-10-CM | POA: Diagnosis not present

## 2020-02-18 DIAGNOSIS — N186 End stage renal disease: Secondary | ICD-10-CM | POA: Insufficient documentation

## 2020-02-18 LAB — CMP (CANCER CENTER ONLY)
ALT: 10 U/L (ref 0–44)
AST: 16 U/L (ref 15–41)
Albumin: 3.4 g/dL — ABNORMAL LOW (ref 3.5–5.0)
Alkaline Phosphatase: 143 U/L — ABNORMAL HIGH (ref 38–126)
Anion gap: 8 (ref 5–15)
BUN: 21 mg/dL (ref 8–23)
CO2: 30 mmol/L (ref 22–32)
Calcium: 9.4 mg/dL (ref 8.9–10.3)
Chloride: 96 mmol/L — ABNORMAL LOW (ref 98–111)
Creatinine: 5.66 mg/dL (ref 0.44–1.00)
GFR, Est AFR Am: 8 mL/min — ABNORMAL LOW (ref >60)
GFR, Estimated: 7 mL/min — ABNORMAL LOW (ref >60)
Glucose, Bld: 113 mg/dL — ABNORMAL HIGH (ref 70–99)
Potassium: 4.1 mmol/L (ref 3.5–5.1)
Sodium: 134 mmol/L — ABNORMAL LOW (ref 135–145)
Total Bilirubin: 0.6 mg/dL (ref 0.3–1.2)
Total Protein: 7.9 g/dL (ref 6.5–8.1)

## 2020-02-18 LAB — CBC WITH DIFFERENTIAL (CANCER CENTER ONLY)
Abs Immature Granulocytes: 0.01 10*3/uL (ref 0.00–0.07)
Basophils Absolute: 0 10*3/uL (ref 0.0–0.1)
Basophils Relative: 1 %
Eosinophils Absolute: 0.1 10*3/uL (ref 0.0–0.5)
Eosinophils Relative: 5 %
HCT: 36.6 % (ref 36.0–46.0)
Hemoglobin: 11.5 g/dL — ABNORMAL LOW (ref 12.0–15.0)
Immature Granulocytes: 0 %
Lymphocytes Relative: 24 %
Lymphs Abs: 0.7 10*3/uL (ref 0.7–4.0)
MCH: 28.2 pg (ref 26.0–34.0)
MCHC: 31.4 g/dL (ref 30.0–36.0)
MCV: 89.7 fL (ref 80.0–100.0)
Monocytes Absolute: 0.4 10*3/uL (ref 0.1–1.0)
Monocytes Relative: 12 %
Neutro Abs: 1.8 10*3/uL (ref 1.7–7.7)
Neutrophils Relative %: 58 %
Platelet Count: 57 10*3/uL — ABNORMAL LOW (ref 150–400)
RBC: 4.08 MIL/uL (ref 3.87–5.11)
RDW: 17.3 % — ABNORMAL HIGH (ref 11.5–15.5)
WBC Count: 3 10*3/uL — ABNORMAL LOW (ref 4.0–10.5)
nRBC: 0 % (ref 0.0–0.2)

## 2020-02-18 NOTE — Progress Notes (Signed)
Received critical value call for a creatinine of 5.66.  Hand delivered result to Dr. Ernestina Penna nurse.  Gardiner Rhyme, RN

## 2020-02-19 ENCOUNTER — Telehealth: Payer: Self-pay | Admitting: Hematology

## 2020-02-19 ENCOUNTER — Encounter: Payer: Self-pay | Admitting: Hematology

## 2020-02-19 DIAGNOSIS — N2581 Secondary hyperparathyroidism of renal origin: Secondary | ICD-10-CM | POA: Diagnosis not present

## 2020-02-19 DIAGNOSIS — N186 End stage renal disease: Secondary | ICD-10-CM | POA: Diagnosis not present

## 2020-02-19 DIAGNOSIS — D631 Anemia in chronic kidney disease: Secondary | ICD-10-CM | POA: Diagnosis not present

## 2020-02-19 DIAGNOSIS — E876 Hypokalemia: Secondary | ICD-10-CM | POA: Diagnosis not present

## 2020-02-19 DIAGNOSIS — D509 Iron deficiency anemia, unspecified: Secondary | ICD-10-CM | POA: Diagnosis not present

## 2020-02-19 DIAGNOSIS — Z992 Dependence on renal dialysis: Secondary | ICD-10-CM | POA: Diagnosis not present

## 2020-02-19 LAB — AFP TUMOR MARKER: AFP, Serum, Tumor Marker: 7.5 ng/mL (ref 0.0–8.3)

## 2020-02-19 NOTE — Telephone Encounter (Signed)
Scheduled appt per 3/3 los.  Sent a message to HIM pool to get a calendar mailed out.

## 2020-02-21 DIAGNOSIS — E876 Hypokalemia: Secondary | ICD-10-CM | POA: Diagnosis not present

## 2020-02-21 DIAGNOSIS — N2581 Secondary hyperparathyroidism of renal origin: Secondary | ICD-10-CM | POA: Diagnosis not present

## 2020-02-21 DIAGNOSIS — N186 End stage renal disease: Secondary | ICD-10-CM | POA: Diagnosis not present

## 2020-02-21 DIAGNOSIS — D509 Iron deficiency anemia, unspecified: Secondary | ICD-10-CM | POA: Diagnosis not present

## 2020-02-21 DIAGNOSIS — Z992 Dependence on renal dialysis: Secondary | ICD-10-CM | POA: Diagnosis not present

## 2020-02-21 DIAGNOSIS — D631 Anemia in chronic kidney disease: Secondary | ICD-10-CM | POA: Diagnosis not present

## 2020-02-24 ENCOUNTER — Telehealth: Payer: Self-pay

## 2020-02-24 DIAGNOSIS — N186 End stage renal disease: Secondary | ICD-10-CM | POA: Diagnosis not present

## 2020-02-24 DIAGNOSIS — E876 Hypokalemia: Secondary | ICD-10-CM | POA: Diagnosis not present

## 2020-02-24 DIAGNOSIS — N2581 Secondary hyperparathyroidism of renal origin: Secondary | ICD-10-CM | POA: Diagnosis not present

## 2020-02-24 DIAGNOSIS — D631 Anemia in chronic kidney disease: Secondary | ICD-10-CM | POA: Diagnosis not present

## 2020-02-24 DIAGNOSIS — D509 Iron deficiency anemia, unspecified: Secondary | ICD-10-CM | POA: Diagnosis not present

## 2020-02-24 DIAGNOSIS — Z992 Dependence on renal dialysis: Secondary | ICD-10-CM | POA: Diagnosis not present

## 2020-02-24 NOTE — Telephone Encounter (Signed)
-----   Message from Milus Banister, MD sent at 02/24/2020 10:22 AM EST ----- Krista Blue, I got your email about her.  Yes she needs repeat EGD. Also hasn't been in the office in almost 4 years now. We'll reach out to her. Thanks  Roslynn Holte, She needs my first available OV for cirrhosis follow up.  Will consider repeat eGD from that appointment.  Thanks  DJ

## 2020-02-24 NOTE — Telephone Encounter (Signed)
4/19 at 850 am appt made with Dr Ardis Hughs called to give pt appt info and no answer no voice mail will mail letter

## 2020-02-26 DIAGNOSIS — D631 Anemia in chronic kidney disease: Secondary | ICD-10-CM | POA: Diagnosis not present

## 2020-02-26 DIAGNOSIS — N2581 Secondary hyperparathyroidism of renal origin: Secondary | ICD-10-CM | POA: Diagnosis not present

## 2020-02-26 DIAGNOSIS — N186 End stage renal disease: Secondary | ICD-10-CM | POA: Diagnosis not present

## 2020-02-26 DIAGNOSIS — D509 Iron deficiency anemia, unspecified: Secondary | ICD-10-CM | POA: Diagnosis not present

## 2020-02-26 DIAGNOSIS — E876 Hypokalemia: Secondary | ICD-10-CM | POA: Diagnosis not present

## 2020-02-26 DIAGNOSIS — Z992 Dependence on renal dialysis: Secondary | ICD-10-CM | POA: Diagnosis not present

## 2020-02-28 DIAGNOSIS — Z992 Dependence on renal dialysis: Secondary | ICD-10-CM | POA: Diagnosis not present

## 2020-02-28 DIAGNOSIS — E876 Hypokalemia: Secondary | ICD-10-CM | POA: Diagnosis not present

## 2020-02-28 DIAGNOSIS — D631 Anemia in chronic kidney disease: Secondary | ICD-10-CM | POA: Diagnosis not present

## 2020-02-28 DIAGNOSIS — N2581 Secondary hyperparathyroidism of renal origin: Secondary | ICD-10-CM | POA: Diagnosis not present

## 2020-02-28 DIAGNOSIS — N186 End stage renal disease: Secondary | ICD-10-CM | POA: Diagnosis not present

## 2020-02-28 DIAGNOSIS — D509 Iron deficiency anemia, unspecified: Secondary | ICD-10-CM | POA: Diagnosis not present

## 2020-03-02 DIAGNOSIS — N186 End stage renal disease: Secondary | ICD-10-CM | POA: Diagnosis not present

## 2020-03-02 DIAGNOSIS — N2581 Secondary hyperparathyroidism of renal origin: Secondary | ICD-10-CM | POA: Diagnosis not present

## 2020-03-02 DIAGNOSIS — D631 Anemia in chronic kidney disease: Secondary | ICD-10-CM | POA: Diagnosis not present

## 2020-03-02 DIAGNOSIS — Z992 Dependence on renal dialysis: Secondary | ICD-10-CM | POA: Diagnosis not present

## 2020-03-02 DIAGNOSIS — E876 Hypokalemia: Secondary | ICD-10-CM | POA: Diagnosis not present

## 2020-03-02 DIAGNOSIS — D509 Iron deficiency anemia, unspecified: Secondary | ICD-10-CM | POA: Diagnosis not present

## 2020-03-04 DIAGNOSIS — Z992 Dependence on renal dialysis: Secondary | ICD-10-CM | POA: Diagnosis not present

## 2020-03-04 DIAGNOSIS — N2581 Secondary hyperparathyroidism of renal origin: Secondary | ICD-10-CM | POA: Diagnosis not present

## 2020-03-04 DIAGNOSIS — N186 End stage renal disease: Secondary | ICD-10-CM | POA: Diagnosis not present

## 2020-03-04 DIAGNOSIS — E876 Hypokalemia: Secondary | ICD-10-CM | POA: Diagnosis not present

## 2020-03-04 DIAGNOSIS — D509 Iron deficiency anemia, unspecified: Secondary | ICD-10-CM | POA: Diagnosis not present

## 2020-03-04 DIAGNOSIS — D631 Anemia in chronic kidney disease: Secondary | ICD-10-CM | POA: Diagnosis not present

## 2020-03-06 DIAGNOSIS — E876 Hypokalemia: Secondary | ICD-10-CM | POA: Diagnosis not present

## 2020-03-06 DIAGNOSIS — N2581 Secondary hyperparathyroidism of renal origin: Secondary | ICD-10-CM | POA: Diagnosis not present

## 2020-03-06 DIAGNOSIS — N186 End stage renal disease: Secondary | ICD-10-CM | POA: Diagnosis not present

## 2020-03-06 DIAGNOSIS — D509 Iron deficiency anemia, unspecified: Secondary | ICD-10-CM | POA: Diagnosis not present

## 2020-03-06 DIAGNOSIS — Z992 Dependence on renal dialysis: Secondary | ICD-10-CM | POA: Diagnosis not present

## 2020-03-06 DIAGNOSIS — D631 Anemia in chronic kidney disease: Secondary | ICD-10-CM | POA: Diagnosis not present

## 2020-03-09 DIAGNOSIS — Z992 Dependence on renal dialysis: Secondary | ICD-10-CM | POA: Diagnosis not present

## 2020-03-09 DIAGNOSIS — D631 Anemia in chronic kidney disease: Secondary | ICD-10-CM | POA: Diagnosis not present

## 2020-03-09 DIAGNOSIS — N186 End stage renal disease: Secondary | ICD-10-CM | POA: Diagnosis not present

## 2020-03-09 DIAGNOSIS — N2581 Secondary hyperparathyroidism of renal origin: Secondary | ICD-10-CM | POA: Diagnosis not present

## 2020-03-09 DIAGNOSIS — E876 Hypokalemia: Secondary | ICD-10-CM | POA: Diagnosis not present

## 2020-03-09 DIAGNOSIS — D509 Iron deficiency anemia, unspecified: Secondary | ICD-10-CM | POA: Diagnosis not present

## 2020-03-11 DIAGNOSIS — N2581 Secondary hyperparathyroidism of renal origin: Secondary | ICD-10-CM | POA: Diagnosis not present

## 2020-03-11 DIAGNOSIS — E876 Hypokalemia: Secondary | ICD-10-CM | POA: Diagnosis not present

## 2020-03-11 DIAGNOSIS — N186 End stage renal disease: Secondary | ICD-10-CM | POA: Diagnosis not present

## 2020-03-11 DIAGNOSIS — Z992 Dependence on renal dialysis: Secondary | ICD-10-CM | POA: Diagnosis not present

## 2020-03-11 DIAGNOSIS — D631 Anemia in chronic kidney disease: Secondary | ICD-10-CM | POA: Diagnosis not present

## 2020-03-11 DIAGNOSIS — D509 Iron deficiency anemia, unspecified: Secondary | ICD-10-CM | POA: Diagnosis not present

## 2020-03-13 DIAGNOSIS — N186 End stage renal disease: Secondary | ICD-10-CM | POA: Diagnosis not present

## 2020-03-13 DIAGNOSIS — E876 Hypokalemia: Secondary | ICD-10-CM | POA: Diagnosis not present

## 2020-03-13 DIAGNOSIS — D509 Iron deficiency anemia, unspecified: Secondary | ICD-10-CM | POA: Diagnosis not present

## 2020-03-13 DIAGNOSIS — D631 Anemia in chronic kidney disease: Secondary | ICD-10-CM | POA: Diagnosis not present

## 2020-03-13 DIAGNOSIS — N2581 Secondary hyperparathyroidism of renal origin: Secondary | ICD-10-CM | POA: Diagnosis not present

## 2020-03-13 DIAGNOSIS — Z992 Dependence on renal dialysis: Secondary | ICD-10-CM | POA: Diagnosis not present

## 2020-03-16 DIAGNOSIS — E876 Hypokalemia: Secondary | ICD-10-CM | POA: Diagnosis not present

## 2020-03-16 DIAGNOSIS — N186 End stage renal disease: Secondary | ICD-10-CM | POA: Diagnosis not present

## 2020-03-16 DIAGNOSIS — D631 Anemia in chronic kidney disease: Secondary | ICD-10-CM | POA: Diagnosis not present

## 2020-03-16 DIAGNOSIS — Z992 Dependence on renal dialysis: Secondary | ICD-10-CM | POA: Diagnosis not present

## 2020-03-16 DIAGNOSIS — N2581 Secondary hyperparathyroidism of renal origin: Secondary | ICD-10-CM | POA: Diagnosis not present

## 2020-03-16 DIAGNOSIS — D509 Iron deficiency anemia, unspecified: Secondary | ICD-10-CM | POA: Diagnosis not present

## 2020-03-18 DIAGNOSIS — N186 End stage renal disease: Secondary | ICD-10-CM | POA: Diagnosis not present

## 2020-03-18 DIAGNOSIS — I129 Hypertensive chronic kidney disease with stage 1 through stage 4 chronic kidney disease, or unspecified chronic kidney disease: Secondary | ICD-10-CM | POA: Diagnosis not present

## 2020-03-18 DIAGNOSIS — Z23 Encounter for immunization: Secondary | ICD-10-CM | POA: Diagnosis not present

## 2020-03-18 DIAGNOSIS — Z992 Dependence on renal dialysis: Secondary | ICD-10-CM | POA: Diagnosis not present

## 2020-03-18 DIAGNOSIS — E876 Hypokalemia: Secondary | ICD-10-CM | POA: Diagnosis not present

## 2020-03-18 DIAGNOSIS — N2581 Secondary hyperparathyroidism of renal origin: Secondary | ICD-10-CM | POA: Diagnosis not present

## 2020-03-18 DIAGNOSIS — D631 Anemia in chronic kidney disease: Secondary | ICD-10-CM | POA: Diagnosis not present

## 2020-03-20 DIAGNOSIS — Z992 Dependence on renal dialysis: Secondary | ICD-10-CM | POA: Diagnosis not present

## 2020-03-20 DIAGNOSIS — N2581 Secondary hyperparathyroidism of renal origin: Secondary | ICD-10-CM | POA: Diagnosis not present

## 2020-03-20 DIAGNOSIS — E876 Hypokalemia: Secondary | ICD-10-CM | POA: Diagnosis not present

## 2020-03-20 DIAGNOSIS — N186 End stage renal disease: Secondary | ICD-10-CM | POA: Diagnosis not present

## 2020-03-20 DIAGNOSIS — Z23 Encounter for immunization: Secondary | ICD-10-CM | POA: Diagnosis not present

## 2020-03-20 DIAGNOSIS — D631 Anemia in chronic kidney disease: Secondary | ICD-10-CM | POA: Diagnosis not present

## 2020-03-23 DIAGNOSIS — N186 End stage renal disease: Secondary | ICD-10-CM | POA: Diagnosis not present

## 2020-03-23 DIAGNOSIS — N2581 Secondary hyperparathyroidism of renal origin: Secondary | ICD-10-CM | POA: Diagnosis not present

## 2020-03-23 DIAGNOSIS — E876 Hypokalemia: Secondary | ICD-10-CM | POA: Diagnosis not present

## 2020-03-23 DIAGNOSIS — Z23 Encounter for immunization: Secondary | ICD-10-CM | POA: Diagnosis not present

## 2020-03-23 DIAGNOSIS — D631 Anemia in chronic kidney disease: Secondary | ICD-10-CM | POA: Diagnosis not present

## 2020-03-23 DIAGNOSIS — Z992 Dependence on renal dialysis: Secondary | ICD-10-CM | POA: Diagnosis not present

## 2020-03-25 DIAGNOSIS — Z23 Encounter for immunization: Secondary | ICD-10-CM | POA: Diagnosis not present

## 2020-03-25 DIAGNOSIS — N2581 Secondary hyperparathyroidism of renal origin: Secondary | ICD-10-CM | POA: Diagnosis not present

## 2020-03-25 DIAGNOSIS — D631 Anemia in chronic kidney disease: Secondary | ICD-10-CM | POA: Diagnosis not present

## 2020-03-25 DIAGNOSIS — Z992 Dependence on renal dialysis: Secondary | ICD-10-CM | POA: Diagnosis not present

## 2020-03-25 DIAGNOSIS — N186 End stage renal disease: Secondary | ICD-10-CM | POA: Diagnosis not present

## 2020-03-25 DIAGNOSIS — E876 Hypokalemia: Secondary | ICD-10-CM | POA: Diagnosis not present

## 2020-03-27 DIAGNOSIS — D631 Anemia in chronic kidney disease: Secondary | ICD-10-CM | POA: Diagnosis not present

## 2020-03-27 DIAGNOSIS — Z23 Encounter for immunization: Secondary | ICD-10-CM | POA: Diagnosis not present

## 2020-03-27 DIAGNOSIS — N186 End stage renal disease: Secondary | ICD-10-CM | POA: Diagnosis not present

## 2020-03-27 DIAGNOSIS — N2581 Secondary hyperparathyroidism of renal origin: Secondary | ICD-10-CM | POA: Diagnosis not present

## 2020-03-27 DIAGNOSIS — E876 Hypokalemia: Secondary | ICD-10-CM | POA: Diagnosis not present

## 2020-03-27 DIAGNOSIS — Z992 Dependence on renal dialysis: Secondary | ICD-10-CM | POA: Diagnosis not present

## 2020-03-30 DIAGNOSIS — E876 Hypokalemia: Secondary | ICD-10-CM | POA: Diagnosis not present

## 2020-03-30 DIAGNOSIS — Z992 Dependence on renal dialysis: Secondary | ICD-10-CM | POA: Diagnosis not present

## 2020-03-30 DIAGNOSIS — N2581 Secondary hyperparathyroidism of renal origin: Secondary | ICD-10-CM | POA: Diagnosis not present

## 2020-03-30 DIAGNOSIS — Z23 Encounter for immunization: Secondary | ICD-10-CM | POA: Diagnosis not present

## 2020-03-30 DIAGNOSIS — D631 Anemia in chronic kidney disease: Secondary | ICD-10-CM | POA: Diagnosis not present

## 2020-03-30 DIAGNOSIS — N186 End stage renal disease: Secondary | ICD-10-CM | POA: Diagnosis not present

## 2020-04-01 DIAGNOSIS — E876 Hypokalemia: Secondary | ICD-10-CM | POA: Diagnosis not present

## 2020-04-01 DIAGNOSIS — D631 Anemia in chronic kidney disease: Secondary | ICD-10-CM | POA: Diagnosis not present

## 2020-04-01 DIAGNOSIS — Z23 Encounter for immunization: Secondary | ICD-10-CM | POA: Diagnosis not present

## 2020-04-01 DIAGNOSIS — N186 End stage renal disease: Secondary | ICD-10-CM | POA: Diagnosis not present

## 2020-04-01 DIAGNOSIS — N2581 Secondary hyperparathyroidism of renal origin: Secondary | ICD-10-CM | POA: Diagnosis not present

## 2020-04-01 DIAGNOSIS — Z992 Dependence on renal dialysis: Secondary | ICD-10-CM | POA: Diagnosis not present

## 2020-04-03 ENCOUNTER — Other Ambulatory Visit: Payer: Self-pay | Admitting: Internal Medicine

## 2020-04-03 DIAGNOSIS — N2581 Secondary hyperparathyroidism of renal origin: Secondary | ICD-10-CM | POA: Diagnosis not present

## 2020-04-03 DIAGNOSIS — Z992 Dependence on renal dialysis: Secondary | ICD-10-CM | POA: Diagnosis not present

## 2020-04-03 DIAGNOSIS — D631 Anemia in chronic kidney disease: Secondary | ICD-10-CM | POA: Diagnosis not present

## 2020-04-03 DIAGNOSIS — N186 End stage renal disease: Secondary | ICD-10-CM | POA: Diagnosis not present

## 2020-04-03 DIAGNOSIS — Z23 Encounter for immunization: Secondary | ICD-10-CM | POA: Diagnosis not present

## 2020-04-03 DIAGNOSIS — E876 Hypokalemia: Secondary | ICD-10-CM | POA: Diagnosis not present

## 2020-04-03 NOTE — Telephone Encounter (Signed)
Please refill as per office routine med refill policy (all routine meds refilled for 3 mo or monthly per pt preference up to one year from last visit, then month to month grace period for 3 mo, then further med refills will have to be denied)  

## 2020-04-05 ENCOUNTER — Other Ambulatory Visit (INDEPENDENT_AMBULATORY_CARE_PROVIDER_SITE_OTHER): Payer: Medicare Other

## 2020-04-05 ENCOUNTER — Encounter: Payer: Self-pay | Admitting: Gastroenterology

## 2020-04-05 ENCOUNTER — Ambulatory Visit (INDEPENDENT_AMBULATORY_CARE_PROVIDER_SITE_OTHER): Payer: Medicare Other | Admitting: Gastroenterology

## 2020-04-05 VITALS — BP 128/60 | HR 60 | Temp 97.0°F | Ht 63.0 in | Wt 118.6 lb

## 2020-04-05 DIAGNOSIS — K7460 Unspecified cirrhosis of liver: Secondary | ICD-10-CM

## 2020-04-05 LAB — CBC
HCT: 33 % — ABNORMAL LOW (ref 36.0–46.0)
Hemoglobin: 10.8 g/dL — ABNORMAL LOW (ref 12.0–15.0)
MCHC: 32.9 g/dL (ref 30.0–36.0)
MCV: 86.6 fl (ref 78.0–100.0)
Platelets: 94 10*3/uL — ABNORMAL LOW (ref 150.0–400.0)
RBC: 3.81 Mil/uL — ABNORMAL LOW (ref 3.87–5.11)
RDW: 18 % — ABNORMAL HIGH (ref 11.5–15.5)
WBC: 4.4 10*3/uL (ref 4.0–10.5)

## 2020-04-05 LAB — COMPREHENSIVE METABOLIC PANEL
ALT: 11 U/L (ref 0–35)
AST: 18 U/L (ref 0–37)
Albumin: 4.1 g/dL (ref 3.5–5.2)
Alkaline Phosphatase: 151 U/L — ABNORMAL HIGH (ref 39–117)
BUN: 34 mg/dL — ABNORMAL HIGH (ref 6–23)
CO2: 32 mEq/L (ref 19–32)
Calcium: 9.7 mg/dL (ref 8.4–10.5)
Chloride: 92 mEq/L — ABNORMAL LOW (ref 96–112)
Creatinine, Ser: 7.2 mg/dL (ref 0.40–1.20)
GFR: 6.69 mL/min — CL (ref >60.00)
Glucose, Bld: 99 mg/dL (ref 70–99)
Potassium: 4.1 mEq/L (ref 3.5–5.1)
Sodium: 136 mEq/L (ref 135–145)
Total Bilirubin: 0.5 mg/dL (ref 0.2–1.2)
Total Protein: 8.2 g/dL (ref 6.0–8.3)

## 2020-04-05 LAB — PROTIME-INR
INR: 1 ratio (ref 0.8–1.0)
Prothrombin Time: 10.7 s (ref 9.6–13.1)

## 2020-04-05 NOTE — Progress Notes (Signed)
Review of pertinent gastrointestinal problems: 1. 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 HIV negative. Labs 9 2016: Slightly low platelets in the 140s, INR normal, LFTs normal. Slightly elevated IgG levels.  Met with rheumatology (agree possible AIH), curbside Dr. Lurlean Leyden 2017 ; no need to treat since likely burnt out, normal LFTs.  EGD; 12/2015 gastritis that was H. Pylori neg; No varices, no portal gastropathy  2.  Stage I HCC: March 2016 ultrasound showed cirrhotic appearing liver and is suspicious liver mass. Follow-up MRI of the liver March 2016 confirmed the cirrhosis and was diagnostic for 1.5 cm right hepatic lobe hepatocellular cancer. Seen by Dr. Burr Medico from oncology and eventually she underwent CT-guided thermal ablation of her liver cancer on 05/14/2015 by Dr. Kathlene Cote. She tolerated the procedure very well. Her repeated scan in July showed good response.  March 2021 oncology appointment felt that she was doing quite well, no signs of recurrence or progression.  Planning to follow-up at 1 year interval.  Planning yearly MRI scans.  3.  Routine risk for colon cancer, colonoscopy January 2017 showed diverticulosis but no polyps or cancers.    HPI: This is a very pleasant 74 year old woman who is here with her daughter today.  I last saw her here in the office about 4 years ago.  She has been doing very well from an oncologic perspective.  There is no sign of recurrence of her stage I hepatocellular cancer.  She is getting annual MRIs  Blood work March 2021 shows normal AFP, white count 3000, hemoglobin 11.5, platelets 57, complete metabolic profile showed creatinine 5.7, sodium 134, alk phos 143, normal total bilirubin.  She has had no overt GI bleeding, no trouble with edema, no trouble with ascites, no trouble  with obvious encephalopathy.  She overall feels quite well.   Review of systems: Pertinent positive and negative review of systems were noted in the above HPI section. All other review negative.   Past Medical History:  Diagnosis Date  . ALCOHOL ABUSE 07/13/2010  . ANXIETY 12/22/2009  . Cancer [facility])    liver cancer  . DEPRESSION 12/22/2009  . 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 DJD (degenerative joint disease), cervical    patient denies on preop of 05/10/15   . FATIGUE 12/23/2010  . GALLSTONES 12/22/2009  . Gallstones   . Headache    occasional   . HYPERLIPIDEMIA 12/22/2009  . HYPERTENSION 12/22/2009  . HYPONATREMIA 12/22/2009  . 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 OSTEOPENIA 12/22/2009  . Pancytopenia 07/13/2010  . RENAL CYST, LEFT 12/22/2009  . VITAMIN D DEFICIENCY 12/23/2010  . WEIGHT LOSS 12/23/2010    Past Surgical History:  Procedure Laterality Date  . A/V FISTULAGRAM N/A 10/25/2018   Procedure: A/V FISTULAGRAM;  Surgeon: Elam Dutch, MD;  Location: [place] [facility];  Service: Cardiovascular;  Laterality: N/A;  . ABDOMINAL HYSTERECTOMY    . AV FISTULA PLACEMENT Left 05/21/2018   Procedure: INSERTION OF ARTERIOVENOUS (AV) GORE-TEX GRAFT UPPER ARM;  Surgeon: Elam Dutch, MD;  Location: [facility] OR;  Service: Vascular;  Laterality: Left;  . AV FISTULA PLACEMENT Left 06/03/2018   Procedure: INSERTION OF ARTERIOVENOUS (AV) GORE-TEX 75m x 10cm GRAFT INTO LEFT ARM;  Surgeon: FElam Dutch MD;  [facility]  Service: Vascular;  Laterality: Left;  . IR GENERIC HISTORICAL  10/18/2016  IR RADIOLOGIST EVAL & MGMT 10/18/2016 [name], MD [facility] INTERV RAD  . IR RADIOLOGIST EVAL & MGMT  05/30/2017  . IR RADIOLOGIST EVAL & MGMT  08/28/2018  . IR RADIOLOGIST EVAL & MGMT  10/08/2019  . IR RADIOLOGIST EVAL & MGMT  10/30/2019  . surgery for liver ca    . THROMBECTOMY AND REVISION OF ARTERIOVENTOUS (AV) GORETEX  GRAFT Left 06/03/2018   Procedure: THROMBECTOMY AND REVISION OF  ARTERIOVENTOUS (AV) GORETEX  GRAFT ARM;  Surgeon: Elam Dutch, MD;  Location: [facility] OR;  Service: Vascular;  Laterality: Left;    Current Outpatient Medications  Medication Sig Dispense Refill  . ALPRAZolam (XANAX) 0.5 MG tablet TAKE 1 TABLET BY MOUTH ONCE DAILY AT BEDTIME 30 tablet 5  . amLODipine (NORVASC) 10 MG tablet Take 1 tablet (10 mg total) by mouth daily. 90 tablet 3  . B Complex-C-Zn-Folic Acid (DIALYVITE 161 WITH ZINC) 0.8 MG TABS Take 1 tablet by mouth daily.  12  . Cholecalciferol (VITAMIN D3) 1000 UNITS CAPS Take 1,000 Units by mouth daily.     . CONSTULOSE 10 GM/15ML solution TAKE 30 MILLILITERS BY MOUTH ONCE DAILY 473 mL 0  . Darbepoetin Alfa (ARANESP) 100 MCG/0.5ML SOSY injection Inject 0.5 mLs (100 mcg total) into the vein every Thursday with hemodialysis. 4.2 mL   . folic acid (FOLVITE) 1 MG tablet Take 1 tablet by mouth once daily 90 tablet 2  . hydrALAZINE (APRESOLINE) 100 MG tablet TAKE 1 TABLET BY MOUTH THREE TIMES DAILY 270 tablet 2  . ketotifen (ZADITOR) 0.025 % ophthalmic solution Place 1 drop into both eyes 2 (two) times daily.    Marland Kitchen lidocaine-prilocaine (EMLA) cream Apply 1 application topically daily as needed (use before dialysis).   12  . propranolol (INDERAL) 40 MG tablet Take 1 tablet by mouth twice daily 180 tablet 0  . Propylene Glycol (SYSTANE BALANCE) 0.6 % SOLN Place 1 drop into both eyes daily.     No current facility-administered medications for this visit.    Allergies as of 04/05/2020  . (No Known Allergies)    Family History  Problem Relation Age of Onset  . Lung cancer Brother   . Breast cancer Other   . Breast cancer Sister   . Gastric cancer Sister   . Lung cancer Sister   . Colon cancer Neg Hx   . Colon polyps Neg Hx   . Rectal cancer Neg Hx   . Stomach cancer Neg Hx   . Esophageal cancer Neg Hx     Social History   Socioeconomic History  . Marital status: Widowed    Spouse name: Not on file  . Number of children: 4  .  Years of education: Not on file  . Highest education level: Not on file  Occupational History  . Occupation: retired  Tobacco Use  . Smoking status: Never Smoker  . Smokeless tobacco: Never Used  Substance and Sexual Activity  . Alcohol use: Yes    Alcohol/week: 3.0 - 4.0 standard drinks    Types: 3 - 4 Cans of beer per week    Comment: she quit 02/2015   . Drug use: No  . Sexual activity: Not on file  Other Topics Concern  . Not on file  Social History Narrative   Widowed, grown son lives with her   Retired from Photographer upkeep   Has total of #5 children   Enjoys watching TV and planting flowers   Social Determinants of Health  Financial Resource Strain:   . Difficulty of Paying Living Expenses:   Food Insecurity:   . Worried About Charity fundraiser in the Last Year:   . Arboriculturist in the Last Year:   Transportation Needs:   . Film/video editor (Medical):   Marland Kitchen Lack of Transportation (Non-Medical):   Physical Activity:   . Days of Exercise per Week:   . Minutes of Exercise per Session:   Stress:   . Feeling of Stress :   Social Connections:   . Frequency of Communication with Friends and Family:   . Frequency of Social Gatherings with Friends and Family:   . Attends Religious Services:   . Active Member of Clubs or Organizations:   . Attends Archivist Meetings:   Marland Kitchen Marital Status:   Intimate Partner Violence:   . Fear of Current or Ex-Partner:   . Emotionally Abused:   Marland Kitchen Physically Abused:   . Sexually Abused:      Physical Exam: BP 128/60   Pulse 60   Temp (!) 97 F (36.1 C)   Ht 5' 3" (1.6 m)   Wt 118 lb 9.6 oz (53.8 kg)   SpO2 98%   BMI 21.01 kg/m  Constitutional: generally well-appearing Psychiatric: alert and oriented x3 Eyes: extraocular movements intact Mouth: oral pharynx moist, no lesions Neck: supple no lymphadenopathy Cardiovascular: heart regular rate and rhythm Lungs: clear to auscultation  bilaterally Abdomen: soft, nontender, nondistended, no obvious ascites, no peritoneal signs, normal bowel sounds Extremities: no lower extremity edema bilaterally Skin: no lesions on visible extremities   Assessment and plan: 74 y.o. female with compensated cirrhosis, hepatocellular cancer stage I  First I think it is fantastic that her stage I hepatocellular cancer has likely been cured with interventional radiology techniques.  She is followed by Dr. Burr Medico with office visit and MRI annually.  She is on hemodialysis 3 times weekly.  She has no signs of decompensated liver disease other than hepatocellular cancer her last upper endoscopy was 4 years ago.  I recommended repeat EGD to screen her again for portal hypertensive changes, varices.  She needs meld calculation and we will send her down to the lab for CBC, complete metabolic profile and coags.  She has no ascites or edema.  She understands I would like to see her about once a year, after her upper endoscopy we will arrange for follow-up appointment.   Please see the "Patient Instructions" section for addition details about the plan.   Owens Loffler, MD [facility] 04/05/2020, 9:00 AM  Cc: Biagio Borg, MD  Total time on date of encounter was 45  minutes (this included time spent preparing to see the patient reviewing records; obtaining and/or reviewing separately obtained history; performing a medically appropriate exam and/or evaluation; counseling and educating the patient and family if present; ordering medications, tests or procedures if applicable; and documenting clinical information in the health record).

## 2020-04-05 NOTE — Patient Instructions (Addendum)
If you are age 76 or older, your body mass index should be between 23-30. Your Body mass index is 21.01 kg/m. If this is out of the aforementioned range listed, please consider follow up with your Primary Care Provider.  If you are age 95 or younger, your body mass index should be between 19-25. Your Body mass index is 21.01 kg/m. If this is out of the aformentioned range listed, please consider follow up with your Primary Care Provider.   Your provider has requested that you go to the basement level for lab work before leaving today. Press "B" on the elevator. The lab is located at the first door on the left as you exit the elevator.  Due to recent changes in healthcare laws, you may see the results of your imaging and laboratory studies on MyChart before your provider has had a chance to review them.  We understand that in some cases there may be results that are confusing or concerning to you. Not all laboratory results come back in the same time frame and the provider may be waiting for multiple results in order to interpret others.  Please give [place] 48 hours in order for your provider to thoroughly review all the results before contacting the office for clarification of your results.   You have been scheduled for an endoscopy. Please follow written instructions given to you at your visit today. If you use inhalers (even only as needed), please bring them with you on the day of your procedure.  Thank you for entrusting me with your care and choosing [facility].  Dr Ardis Hughs

## 2020-04-06 DIAGNOSIS — E876 Hypokalemia: Secondary | ICD-10-CM | POA: Diagnosis not present

## 2020-04-06 DIAGNOSIS — N2581 Secondary hyperparathyroidism of renal origin: Secondary | ICD-10-CM | POA: Diagnosis not present

## 2020-04-06 DIAGNOSIS — D631 Anemia in chronic kidney disease: Secondary | ICD-10-CM | POA: Diagnosis not present

## 2020-04-06 DIAGNOSIS — Z23 Encounter for immunization: Secondary | ICD-10-CM | POA: Diagnosis not present

## 2020-04-06 DIAGNOSIS — N186 End stage renal disease: Secondary | ICD-10-CM | POA: Diagnosis not present

## 2020-04-06 DIAGNOSIS — Z992 Dependence on renal dialysis: Secondary | ICD-10-CM | POA: Diagnosis not present

## 2020-04-08 DIAGNOSIS — D631 Anemia in chronic kidney disease: Secondary | ICD-10-CM | POA: Diagnosis not present

## 2020-04-08 DIAGNOSIS — Z23 Encounter for immunization: Secondary | ICD-10-CM | POA: Diagnosis not present

## 2020-04-08 DIAGNOSIS — E876 Hypokalemia: Secondary | ICD-10-CM | POA: Diagnosis not present

## 2020-04-08 DIAGNOSIS — N2581 Secondary hyperparathyroidism of renal origin: Secondary | ICD-10-CM | POA: Diagnosis not present

## 2020-04-08 DIAGNOSIS — Z992 Dependence on renal dialysis: Secondary | ICD-10-CM | POA: Diagnosis not present

## 2020-04-08 DIAGNOSIS — N186 End stage renal disease: Secondary | ICD-10-CM | POA: Diagnosis not present

## 2020-04-10 DIAGNOSIS — E876 Hypokalemia: Secondary | ICD-10-CM | POA: Diagnosis not present

## 2020-04-10 DIAGNOSIS — N2581 Secondary hyperparathyroidism of renal origin: Secondary | ICD-10-CM | POA: Diagnosis not present

## 2020-04-10 DIAGNOSIS — D631 Anemia in chronic kidney disease: Secondary | ICD-10-CM | POA: Diagnosis not present

## 2020-04-10 DIAGNOSIS — Z992 Dependence on renal dialysis: Secondary | ICD-10-CM | POA: Diagnosis not present

## 2020-04-10 DIAGNOSIS — Z23 Encounter for immunization: Secondary | ICD-10-CM | POA: Diagnosis not present

## 2020-04-10 DIAGNOSIS — N186 End stage renal disease: Secondary | ICD-10-CM | POA: Diagnosis not present

## 2020-04-13 DIAGNOSIS — E876 Hypokalemia: Secondary | ICD-10-CM | POA: Diagnosis not present

## 2020-04-13 DIAGNOSIS — N2581 Secondary hyperparathyroidism of renal origin: Secondary | ICD-10-CM | POA: Diagnosis not present

## 2020-04-13 DIAGNOSIS — Z23 Encounter for immunization: Secondary | ICD-10-CM | POA: Diagnosis not present

## 2020-04-13 DIAGNOSIS — Z992 Dependence on renal dialysis: Secondary | ICD-10-CM | POA: Diagnosis not present

## 2020-04-13 DIAGNOSIS — D631 Anemia in chronic kidney disease: Secondary | ICD-10-CM | POA: Diagnosis not present

## 2020-04-13 DIAGNOSIS — N186 End stage renal disease: Secondary | ICD-10-CM | POA: Diagnosis not present

## 2020-04-15 DIAGNOSIS — Z992 Dependence on renal dialysis: Secondary | ICD-10-CM | POA: Diagnosis not present

## 2020-04-15 DIAGNOSIS — Z23 Encounter for immunization: Secondary | ICD-10-CM | POA: Diagnosis not present

## 2020-04-15 DIAGNOSIS — N186 End stage renal disease: Secondary | ICD-10-CM | POA: Diagnosis not present

## 2020-04-15 DIAGNOSIS — D631 Anemia in chronic kidney disease: Secondary | ICD-10-CM | POA: Diagnosis not present

## 2020-04-15 DIAGNOSIS — E876 Hypokalemia: Secondary | ICD-10-CM | POA: Diagnosis not present

## 2020-04-15 DIAGNOSIS — N2581 Secondary hyperparathyroidism of renal origin: Secondary | ICD-10-CM | POA: Diagnosis not present

## 2020-04-17 DIAGNOSIS — D509 Iron deficiency anemia, unspecified: Secondary | ICD-10-CM | POA: Diagnosis not present

## 2020-04-17 DIAGNOSIS — D631 Anemia in chronic kidney disease: Secondary | ICD-10-CM | POA: Diagnosis not present

## 2020-04-17 DIAGNOSIS — N186 End stage renal disease: Secondary | ICD-10-CM | POA: Diagnosis not present

## 2020-04-17 DIAGNOSIS — E876 Hypokalemia: Secondary | ICD-10-CM | POA: Diagnosis not present

## 2020-04-17 DIAGNOSIS — I129 Hypertensive chronic kidney disease with stage 1 through stage 4 chronic kidney disease, or unspecified chronic kidney disease: Secondary | ICD-10-CM | POA: Diagnosis not present

## 2020-04-17 DIAGNOSIS — N2581 Secondary hyperparathyroidism of renal origin: Secondary | ICD-10-CM | POA: Diagnosis not present

## 2020-04-17 DIAGNOSIS — Z992 Dependence on renal dialysis: Secondary | ICD-10-CM | POA: Diagnosis not present

## 2020-04-19 DIAGNOSIS — N186 End stage renal disease: Secondary | ICD-10-CM | POA: Diagnosis not present

## 2020-04-19 DIAGNOSIS — I871 Compression of vein: Secondary | ICD-10-CM | POA: Diagnosis not present

## 2020-04-19 DIAGNOSIS — Z992 Dependence on renal dialysis: Secondary | ICD-10-CM | POA: Diagnosis not present

## 2020-04-19 DIAGNOSIS — T82858A Stenosis of vascular prosthetic devices, implants and grafts, initial encounter: Secondary | ICD-10-CM | POA: Diagnosis not present

## 2020-04-20 DIAGNOSIS — E876 Hypokalemia: Secondary | ICD-10-CM | POA: Diagnosis not present

## 2020-04-20 DIAGNOSIS — Z992 Dependence on renal dialysis: Secondary | ICD-10-CM | POA: Diagnosis not present

## 2020-04-20 DIAGNOSIS — N2581 Secondary hyperparathyroidism of renal origin: Secondary | ICD-10-CM | POA: Diagnosis not present

## 2020-04-20 DIAGNOSIS — N186 End stage renal disease: Secondary | ICD-10-CM | POA: Diagnosis not present

## 2020-04-20 DIAGNOSIS — D509 Iron deficiency anemia, unspecified: Secondary | ICD-10-CM | POA: Diagnosis not present

## 2020-04-20 DIAGNOSIS — D631 Anemia in chronic kidney disease: Secondary | ICD-10-CM | POA: Diagnosis not present

## 2020-04-21 ENCOUNTER — Ambulatory Visit (AMBULATORY_SURGERY_CENTER): Payer: Medicare Other | Admitting: Gastroenterology

## 2020-04-21 ENCOUNTER — Encounter: Payer: Self-pay | Admitting: Gastroenterology

## 2020-04-21 ENCOUNTER — Other Ambulatory Visit: Payer: Self-pay

## 2020-04-21 VITALS — BP 135/40 | HR 55 | Temp 97.3°F | Resp 18 | Ht 63.0 in | Wt 118.0 lb

## 2020-04-21 DIAGNOSIS — K766 Portal hypertension: Secondary | ICD-10-CM

## 2020-04-21 DIAGNOSIS — K319 Disease of stomach and duodenum, unspecified: Secondary | ICD-10-CM | POA: Diagnosis not present

## 2020-04-21 DIAGNOSIS — K3189 Other diseases of stomach and duodenum: Secondary | ICD-10-CM | POA: Diagnosis not present

## 2020-04-21 DIAGNOSIS — K2970 Gastritis, unspecified, without bleeding: Secondary | ICD-10-CM

## 2020-04-21 DIAGNOSIS — Z13810 Encounter for screening for upper gastrointestinal disorder: Secondary | ICD-10-CM | POA: Diagnosis not present

## 2020-04-21 DIAGNOSIS — K7460 Unspecified cirrhosis of liver: Secondary | ICD-10-CM

## 2020-04-21 DIAGNOSIS — K2990 Gastroduodenitis, unspecified, without bleeding: Secondary | ICD-10-CM

## 2020-04-21 MED ORDER — SODIUM CHLORIDE 0.9 % IV SOLN: 500.0000 mL | INTRAVENOUS | Status: DC

## 2020-04-21 NOTE — Patient Instructions (Signed)
Thank you for allowing [place] to care for you today!  Await pathology results by mail, approximately 2 weeks.  Resume previous diet and medications today.  Return to your normal activities tomorrow.      YOU HAD AN ENDOSCOPIC PROCEDURE TODAY AT [facility]:   Refer to the procedure report that was given to you for any specific questions about what was found during the examination.  If the procedure report does not answer your questions, please call your gastroenterologist to clarify.  If you requested that your care partner not be given the details of your procedure findings, then the procedure report has been included in a sealed envelope for you to review at your convenience later.  YOU SHOULD EXPECT: Some feelings of bloating in the abdomen. Passage of more gas than usual.  Walking can help get rid of the air that was put into your GI tract during the procedure and reduce the bloating. If you had a lower endoscopy (such as a colonoscopy or flexible sigmoidoscopy) you may notice spotting of blood in your stool or on the toilet paper. If you underwent a bowel prep for your procedure, you may not have a normal bowel movement for a few days.  Please Note:  You might notice some irritation and congestion in your nose or some drainage.  This is from the oxygen used during your procedure.  There is no need for concern and it should clear up in a day or so.  SYMPTOMS TO REPORT IMMEDIATELY:     Following upper endoscopy (EGD)  Vomiting of blood or coffee ground material  New chest pain or pain under the shoulder blades  Painful or persistently difficult swallowing  New shortness of breath  Fever of 100F or higher  Black, tarry-looking stools  For urgent or emergent issues, a gastroenterologist can be reached at any hour by calling [phone]. Do not use MyChart messaging for urgent concerns.    DIET:  We do recommend a small meal at first, but then you may proceed to  your regular diet.  Drink plenty of fluids but you should avoid alcoholic beverages for 24 hours.  ACTIVITY:  You should plan to take it easy for the rest of today and you should NOT DRIVE or use heavy machinery until tomorrow (because of the sedation medicines used during the test).    FOLLOW UP: Our staff will call the number listed on your records 48-72 hours following your procedure to check on you and address any questions or concerns that you may have regarding the information given to you following your procedure. If we do not reach you, we will leave a message.  We will attempt to reach you two times.  During this call, we will ask if you have developed any symptoms of COVID 19. If you develop any symptoms (ie: fever, flu-like symptoms, shortness of breath, cough etc.) before then, please call [phone].  If you test positive for Covid 19 in the 2 weeks post procedure, please call and report this information to [place].    If any biopsies were taken you will be contacted by phone or by letter within the next 1-3 weeks.  Please call us at [phone] if you have not heard about the biopsies in 3 weeks.    SIGNATURES/CONFIDENTIALITY: You and/or your care partner have signed paperwork which will be entered into your electronic medical record.  These signatures attest to the fact that that the information above on  your After Visit Summary has been reviewed and is understood.  Full responsibility of the confidentiality of this discharge information lies with you and/or your care-partner. 

## 2020-04-21 NOTE — Op Note (Signed)
[facility] Patient Name: Sarah Harmon Procedure Date: 04/21/2020 9:49 AM MRN: [id number] Endoscopist: Milus Banister , MD Age: 74 Referring MD:  Date of Birth: 15-Jan-1946 Gender: Female Account #: [id number] Procedure:                Upper GI endoscopy Indications:              Cirrhosis, screening for portal hypertensive                            changes. EGD 2017 showed H. pylori negative                            gastritis (with focal IM) Medicines:                Monitored Anesthesia Care Procedure:                Pre-Anesthesia Assessment:                           - Prior to the procedure, a History and Physical                            was performed, and patient medications and                            allergies were reviewed. The patient's tolerance of                            previous anesthesia was also reviewed. The risks                            and benefits of the procedure and the sedation                            options and risks were discussed with the patient.                            All questions were answered, and informed consent                            was obtained. Prior Anticoagulants: The patient has                            taken no previous anticoagulant or antiplatelet                            agents. ASA Grade Assessment: III - A patient with                            severe systemic disease. After reviewing the risks                            and benefits, the patient was deemed in  satisfactory condition to undergo the procedure.                           After obtaining informed consent, the endoscope was                            passed under direct vision. Throughout the                            procedure, the patient's blood pressure, pulse, and                            oxygen saturations were monitored continuously. The                            Endoscope was introduced through  the mouth, and                            advanced to the second part of duodenum. The upper                            GI endoscopy was accomplished without difficulty.                            The patient tolerated the procedure well. Scope In: Scope Out: Findings:                 Moderate inflammation characterized by mutiple                            scattered erosions, erythema and granularity was                            found in the gastric body and in the gastric                            antrum. Biopsies were taken with a cold forceps for                            histolog.                           The exam was otherwise without abnormality. Complications:            No immediate complications. Estimated blood loss:                            None. Estimated Blood Loss:     Estimated blood loss: none. Impression:               - Moderate gastritis, biopsied extensively.                           - The examination was otherwise normal. No signs of  portal hypertension. Recommendation:           - Patient has a contact number available for                            emergencies. The signs and symptoms of potential                            delayed complications were discussed with the                            patient. Return to normal activities tomorrow.                            Written discharge instructions were provided to the                            patient.                           - Resume previous diet.                           - Continue present medications.                           - Await pathology results. Milus Banister, MD 04/21/2020 10:04:10 AM This report has been signed electronically.

## 2020-04-21 NOTE — Progress Notes (Signed)
Temp JB V/s CW  

## 2020-04-21 NOTE — Progress Notes (Signed)
Called to room to assist during endoscopic procedure.  Patient ID and intended procedure confirmed with present staff. Received instructions for my participation in the procedure from the performing physician.  

## 2020-04-21 NOTE — Progress Notes (Signed)
pt tolerated well. VSS. awake and to recovery. Report given to RN. Bite block inserted and removed with ease. Atraumatic.

## 2020-04-22 DIAGNOSIS — N186 End stage renal disease: Secondary | ICD-10-CM | POA: Diagnosis not present

## 2020-04-22 DIAGNOSIS — N2581 Secondary hyperparathyroidism of renal origin: Secondary | ICD-10-CM | POA: Diagnosis not present

## 2020-04-22 DIAGNOSIS — D509 Iron deficiency anemia, unspecified: Secondary | ICD-10-CM | POA: Diagnosis not present

## 2020-04-22 DIAGNOSIS — E876 Hypokalemia: Secondary | ICD-10-CM | POA: Diagnosis not present

## 2020-04-22 DIAGNOSIS — D631 Anemia in chronic kidney disease: Secondary | ICD-10-CM | POA: Diagnosis not present

## 2020-04-22 DIAGNOSIS — Z992 Dependence on renal dialysis: Secondary | ICD-10-CM | POA: Diagnosis not present

## 2020-04-23 ENCOUNTER — Telehealth: Payer: Self-pay

## 2020-04-23 NOTE — Telephone Encounter (Signed)
  Follow up Call-  Call back number 04/21/2020  Post procedure Call Back phone  # [phone]  Permission to leave phone message No  Some recent data might be hidden     Patient questions:  Do you have a fever, pain , or abdominal swelling? No. Pain Score  0 *  Have you tolerated food without any problems? Yes.    Have you been able to return to your normal activities? Yes.    Do you have any questions about your discharge instructions: Diet   No. Medications  No. Follow up visit  No.  Do you have questions or concerns about your Care? No.  Actions: * If pain score is 4 or above: No action needed, pain <4. 1. Have you developed a fever since your procedure? no  2.   Have you had an respiratory symptoms (SOB or cough) since your procedure? no  3.   Have you tested positive for COVID 19 since your procedure no  4.   Have you had any family members/close contacts diagnosed with the COVID 19 since your procedure?  no   If yes to any of these questions please route to Joylene John, RN and Erenest Rasher, RN

## 2020-04-24 DIAGNOSIS — N2581 Secondary hyperparathyroidism of renal origin: Secondary | ICD-10-CM | POA: Diagnosis not present

## 2020-04-24 DIAGNOSIS — N186 End stage renal disease: Secondary | ICD-10-CM | POA: Diagnosis not present

## 2020-04-24 DIAGNOSIS — E876 Hypokalemia: Secondary | ICD-10-CM | POA: Diagnosis not present

## 2020-04-24 DIAGNOSIS — Z992 Dependence on renal dialysis: Secondary | ICD-10-CM | POA: Diagnosis not present

## 2020-04-24 DIAGNOSIS — D631 Anemia in chronic kidney disease: Secondary | ICD-10-CM | POA: Diagnosis not present

## 2020-04-24 DIAGNOSIS — D509 Iron deficiency anemia, unspecified: Secondary | ICD-10-CM | POA: Diagnosis not present

## 2020-04-27 DIAGNOSIS — D631 Anemia in chronic kidney disease: Secondary | ICD-10-CM | POA: Diagnosis not present

## 2020-04-27 DIAGNOSIS — E876 Hypokalemia: Secondary | ICD-10-CM | POA: Diagnosis not present

## 2020-04-27 DIAGNOSIS — N186 End stage renal disease: Secondary | ICD-10-CM | POA: Diagnosis not present

## 2020-04-27 DIAGNOSIS — D509 Iron deficiency anemia, unspecified: Secondary | ICD-10-CM | POA: Diagnosis not present

## 2020-04-27 DIAGNOSIS — N2581 Secondary hyperparathyroidism of renal origin: Secondary | ICD-10-CM | POA: Diagnosis not present

## 2020-04-27 DIAGNOSIS — Z992 Dependence on renal dialysis: Secondary | ICD-10-CM | POA: Diagnosis not present

## 2020-04-28 DIAGNOSIS — C220 Liver cell carcinoma: Secondary | ICD-10-CM | POA: Diagnosis not present

## 2020-04-28 DIAGNOSIS — K7469 Other cirrhosis of liver: Secondary | ICD-10-CM | POA: Diagnosis not present

## 2020-04-29 DIAGNOSIS — E876 Hypokalemia: Secondary | ICD-10-CM | POA: Diagnosis not present

## 2020-04-29 DIAGNOSIS — D509 Iron deficiency anemia, unspecified: Secondary | ICD-10-CM | POA: Diagnosis not present

## 2020-04-29 DIAGNOSIS — N2581 Secondary hyperparathyroidism of renal origin: Secondary | ICD-10-CM | POA: Diagnosis not present

## 2020-04-29 DIAGNOSIS — Z992 Dependence on renal dialysis: Secondary | ICD-10-CM | POA: Diagnosis not present

## 2020-04-29 DIAGNOSIS — D631 Anemia in chronic kidney disease: Secondary | ICD-10-CM | POA: Diagnosis not present

## 2020-04-29 DIAGNOSIS — N186 End stage renal disease: Secondary | ICD-10-CM | POA: Diagnosis not present

## 2020-05-01 DIAGNOSIS — D509 Iron deficiency anemia, unspecified: Secondary | ICD-10-CM | POA: Diagnosis not present

## 2020-05-01 DIAGNOSIS — N186 End stage renal disease: Secondary | ICD-10-CM | POA: Diagnosis not present

## 2020-05-01 DIAGNOSIS — D631 Anemia in chronic kidney disease: Secondary | ICD-10-CM | POA: Diagnosis not present

## 2020-05-01 DIAGNOSIS — E876 Hypokalemia: Secondary | ICD-10-CM | POA: Diagnosis not present

## 2020-05-01 DIAGNOSIS — N2581 Secondary hyperparathyroidism of renal origin: Secondary | ICD-10-CM | POA: Diagnosis not present

## 2020-05-01 DIAGNOSIS — Z992 Dependence on renal dialysis: Secondary | ICD-10-CM | POA: Diagnosis not present

## 2020-05-04 DIAGNOSIS — N186 End stage renal disease: Secondary | ICD-10-CM | POA: Diagnosis not present

## 2020-05-04 DIAGNOSIS — D509 Iron deficiency anemia, unspecified: Secondary | ICD-10-CM | POA: Diagnosis not present

## 2020-05-04 DIAGNOSIS — N2581 Secondary hyperparathyroidism of renal origin: Secondary | ICD-10-CM | POA: Diagnosis not present

## 2020-05-04 DIAGNOSIS — D631 Anemia in chronic kidney disease: Secondary | ICD-10-CM | POA: Diagnosis not present

## 2020-05-04 DIAGNOSIS — E876 Hypokalemia: Secondary | ICD-10-CM | POA: Diagnosis not present

## 2020-05-04 DIAGNOSIS — Z992 Dependence on renal dialysis: Secondary | ICD-10-CM | POA: Diagnosis not present

## 2020-05-05 ENCOUNTER — Other Ambulatory Visit: Payer: Self-pay | Admitting: Nurse Practitioner

## 2020-05-05 DIAGNOSIS — K7469 Other cirrhosis of liver: Secondary | ICD-10-CM

## 2020-05-05 DIAGNOSIS — C220 Liver cell carcinoma: Secondary | ICD-10-CM

## 2020-05-06 DIAGNOSIS — N186 End stage renal disease: Secondary | ICD-10-CM | POA: Diagnosis not present

## 2020-05-06 DIAGNOSIS — Z992 Dependence on renal dialysis: Secondary | ICD-10-CM | POA: Diagnosis not present

## 2020-05-06 DIAGNOSIS — D509 Iron deficiency anemia, unspecified: Secondary | ICD-10-CM | POA: Diagnosis not present

## 2020-05-06 DIAGNOSIS — D631 Anemia in chronic kidney disease: Secondary | ICD-10-CM | POA: Diagnosis not present

## 2020-05-06 DIAGNOSIS — E876 Hypokalemia: Secondary | ICD-10-CM | POA: Diagnosis not present

## 2020-05-06 DIAGNOSIS — N2581 Secondary hyperparathyroidism of renal origin: Secondary | ICD-10-CM | POA: Diagnosis not present

## 2020-05-08 DIAGNOSIS — N2581 Secondary hyperparathyroidism of renal origin: Secondary | ICD-10-CM | POA: Diagnosis not present

## 2020-05-08 DIAGNOSIS — Z992 Dependence on renal dialysis: Secondary | ICD-10-CM | POA: Diagnosis not present

## 2020-05-08 DIAGNOSIS — E876 Hypokalemia: Secondary | ICD-10-CM | POA: Diagnosis not present

## 2020-05-08 DIAGNOSIS — N186 End stage renal disease: Secondary | ICD-10-CM | POA: Diagnosis not present

## 2020-05-08 DIAGNOSIS — D509 Iron deficiency anemia, unspecified: Secondary | ICD-10-CM | POA: Diagnosis not present

## 2020-05-08 DIAGNOSIS — D631 Anemia in chronic kidney disease: Secondary | ICD-10-CM | POA: Diagnosis not present

## 2020-05-11 DIAGNOSIS — Z992 Dependence on renal dialysis: Secondary | ICD-10-CM | POA: Diagnosis not present

## 2020-05-11 DIAGNOSIS — N2581 Secondary hyperparathyroidism of renal origin: Secondary | ICD-10-CM | POA: Diagnosis not present

## 2020-05-11 DIAGNOSIS — E876 Hypokalemia: Secondary | ICD-10-CM | POA: Diagnosis not present

## 2020-05-11 DIAGNOSIS — D509 Iron deficiency anemia, unspecified: Secondary | ICD-10-CM | POA: Diagnosis not present

## 2020-05-11 DIAGNOSIS — N186 End stage renal disease: Secondary | ICD-10-CM | POA: Diagnosis not present

## 2020-05-11 DIAGNOSIS — D631 Anemia in chronic kidney disease: Secondary | ICD-10-CM | POA: Diagnosis not present

## 2020-05-13 DIAGNOSIS — D631 Anemia in chronic kidney disease: Secondary | ICD-10-CM | POA: Diagnosis not present

## 2020-05-13 DIAGNOSIS — Z992 Dependence on renal dialysis: Secondary | ICD-10-CM | POA: Diagnosis not present

## 2020-05-13 DIAGNOSIS — N2581 Secondary hyperparathyroidism of renal origin: Secondary | ICD-10-CM | POA: Diagnosis not present

## 2020-05-13 DIAGNOSIS — N186 End stage renal disease: Secondary | ICD-10-CM | POA: Diagnosis not present

## 2020-05-13 DIAGNOSIS — E876 Hypokalemia: Secondary | ICD-10-CM | POA: Diagnosis not present

## 2020-05-13 DIAGNOSIS — D509 Iron deficiency anemia, unspecified: Secondary | ICD-10-CM | POA: Diagnosis not present

## 2020-05-15 DIAGNOSIS — D631 Anemia in chronic kidney disease: Secondary | ICD-10-CM | POA: Diagnosis not present

## 2020-05-15 DIAGNOSIS — E876 Hypokalemia: Secondary | ICD-10-CM | POA: Diagnosis not present

## 2020-05-15 DIAGNOSIS — D509 Iron deficiency anemia, unspecified: Secondary | ICD-10-CM | POA: Diagnosis not present

## 2020-05-15 DIAGNOSIS — N186 End stage renal disease: Secondary | ICD-10-CM | POA: Diagnosis not present

## 2020-05-15 DIAGNOSIS — N2581 Secondary hyperparathyroidism of renal origin: Secondary | ICD-10-CM | POA: Diagnosis not present

## 2020-05-15 DIAGNOSIS — Z992 Dependence on renal dialysis: Secondary | ICD-10-CM | POA: Diagnosis not present

## 2020-05-19 ENCOUNTER — Other Ambulatory Visit: Payer: Self-pay | Admitting: Internal Medicine

## 2020-05-24 ENCOUNTER — Other Ambulatory Visit: Payer: Medicare Other

## 2020-05-31 ENCOUNTER — Ambulatory Visit
Admission: RE | Admit: 2020-05-31 | Discharge: 2020-05-31 | Disposition: A | Discharge status: Home / Self Care | Payer: Medicare Other | Source: Ambulatory Visit | Attending: Nurse Practitioner | Admitting: Nurse Practitioner

## 2020-05-31 DIAGNOSIS — K7460 Unspecified cirrhosis of liver: Secondary | ICD-10-CM | POA: Diagnosis not present

## 2020-05-31 DIAGNOSIS — K7469 Other cirrhosis of liver: Secondary | ICD-10-CM

## 2020-05-31 DIAGNOSIS — C220 Liver cell carcinoma: Secondary | ICD-10-CM

## 2020-06-04 ENCOUNTER — Other Ambulatory Visit: Payer: Self-pay | Admitting: Internal Medicine

## 2020-06-04 NOTE — Telephone Encounter (Signed)
Please refill as per office routine med refill policy (all routine meds refilled for 3 mo or monthly per pt preference up to one year from last visit, then month to month grace period for 3 mo, then further med refills will have to be denied)  

## 2020-06-17 DIAGNOSIS — D509 Iron deficiency anemia, unspecified: Secondary | ICD-10-CM | POA: Diagnosis not present

## 2020-06-17 DIAGNOSIS — N186 End stage renal disease: Secondary | ICD-10-CM | POA: Diagnosis not present

## 2020-06-17 DIAGNOSIS — E876 Hypokalemia: Secondary | ICD-10-CM | POA: Diagnosis not present

## 2020-06-17 DIAGNOSIS — N2581 Secondary hyperparathyroidism of renal origin: Secondary | ICD-10-CM | POA: Diagnosis not present

## 2020-06-17 DIAGNOSIS — D631 Anemia in chronic kidney disease: Secondary | ICD-10-CM | POA: Diagnosis not present

## 2020-06-17 DIAGNOSIS — I129 Hypertensive chronic kidney disease with stage 1 through stage 4 chronic kidney disease, or unspecified chronic kidney disease: Secondary | ICD-10-CM | POA: Diagnosis not present

## 2020-06-17 DIAGNOSIS — Z992 Dependence on renal dialysis: Secondary | ICD-10-CM | POA: Diagnosis not present

## 2020-06-19 DIAGNOSIS — D631 Anemia in chronic kidney disease: Secondary | ICD-10-CM | POA: Diagnosis not present

## 2020-06-19 DIAGNOSIS — E876 Hypokalemia: Secondary | ICD-10-CM | POA: Diagnosis not present

## 2020-06-19 DIAGNOSIS — Z992 Dependence on renal dialysis: Secondary | ICD-10-CM | POA: Diagnosis not present

## 2020-06-19 DIAGNOSIS — N2581 Secondary hyperparathyroidism of renal origin: Secondary | ICD-10-CM | POA: Diagnosis not present

## 2020-06-19 DIAGNOSIS — N186 End stage renal disease: Secondary | ICD-10-CM | POA: Diagnosis not present

## 2020-06-19 DIAGNOSIS — D509 Iron deficiency anemia, unspecified: Secondary | ICD-10-CM | POA: Diagnosis not present

## 2020-06-22 DIAGNOSIS — N186 End stage renal disease: Secondary | ICD-10-CM | POA: Diagnosis not present

## 2020-06-22 DIAGNOSIS — D631 Anemia in chronic kidney disease: Secondary | ICD-10-CM | POA: Diagnosis not present

## 2020-06-22 DIAGNOSIS — E876 Hypokalemia: Secondary | ICD-10-CM | POA: Diagnosis not present

## 2020-06-22 DIAGNOSIS — Z992 Dependence on renal dialysis: Secondary | ICD-10-CM | POA: Diagnosis not present

## 2020-06-22 DIAGNOSIS — D509 Iron deficiency anemia, unspecified: Secondary | ICD-10-CM | POA: Diagnosis not present

## 2020-06-22 DIAGNOSIS — N2581 Secondary hyperparathyroidism of renal origin: Secondary | ICD-10-CM | POA: Diagnosis not present

## 2020-06-23 ENCOUNTER — Other Ambulatory Visit: Payer: Self-pay | Admitting: Internal Medicine

## 2020-06-23 NOTE — Telephone Encounter (Signed)
Done erx 

## 2020-06-24 DIAGNOSIS — N186 End stage renal disease: Secondary | ICD-10-CM | POA: Diagnosis not present

## 2020-06-24 DIAGNOSIS — N2581 Secondary hyperparathyroidism of renal origin: Secondary | ICD-10-CM | POA: Diagnosis not present

## 2020-06-24 DIAGNOSIS — D509 Iron deficiency anemia, unspecified: Secondary | ICD-10-CM | POA: Diagnosis not present

## 2020-06-24 DIAGNOSIS — E876 Hypokalemia: Secondary | ICD-10-CM | POA: Diagnosis not present

## 2020-06-24 DIAGNOSIS — D631 Anemia in chronic kidney disease: Secondary | ICD-10-CM | POA: Diagnosis not present

## 2020-06-24 DIAGNOSIS — Z992 Dependence on renal dialysis: Secondary | ICD-10-CM | POA: Diagnosis not present

## 2020-06-26 DIAGNOSIS — E876 Hypokalemia: Secondary | ICD-10-CM | POA: Diagnosis not present

## 2020-06-26 DIAGNOSIS — D631 Anemia in chronic kidney disease: Secondary | ICD-10-CM | POA: Diagnosis not present

## 2020-06-26 DIAGNOSIS — Z992 Dependence on renal dialysis: Secondary | ICD-10-CM | POA: Diagnosis not present

## 2020-06-26 DIAGNOSIS — N2581 Secondary hyperparathyroidism of renal origin: Secondary | ICD-10-CM | POA: Diagnosis not present

## 2020-06-26 DIAGNOSIS — D509 Iron deficiency anemia, unspecified: Secondary | ICD-10-CM | POA: Diagnosis not present

## 2020-06-26 DIAGNOSIS — N186 End stage renal disease: Secondary | ICD-10-CM | POA: Diagnosis not present

## 2020-06-28 ENCOUNTER — Other Ambulatory Visit: Payer: Self-pay | Admitting: Internal Medicine

## 2020-06-28 DIAGNOSIS — Z1231 Encounter for screening mammogram for malignant neoplasm of breast: Secondary | ICD-10-CM

## 2020-06-29 DIAGNOSIS — D631 Anemia in chronic kidney disease: Secondary | ICD-10-CM | POA: Diagnosis not present

## 2020-06-29 DIAGNOSIS — N2581 Secondary hyperparathyroidism of renal origin: Secondary | ICD-10-CM | POA: Diagnosis not present

## 2020-06-29 DIAGNOSIS — Z992 Dependence on renal dialysis: Secondary | ICD-10-CM | POA: Diagnosis not present

## 2020-06-29 DIAGNOSIS — E876 Hypokalemia: Secondary | ICD-10-CM | POA: Diagnosis not present

## 2020-06-29 DIAGNOSIS — D509 Iron deficiency anemia, unspecified: Secondary | ICD-10-CM | POA: Diagnosis not present

## 2020-06-29 DIAGNOSIS — N186 End stage renal disease: Secondary | ICD-10-CM | POA: Diagnosis not present

## 2020-07-01 DIAGNOSIS — N2581 Secondary hyperparathyroidism of renal origin: Secondary | ICD-10-CM | POA: Diagnosis not present

## 2020-07-01 DIAGNOSIS — N186 End stage renal disease: Secondary | ICD-10-CM | POA: Diagnosis not present

## 2020-07-01 DIAGNOSIS — D509 Iron deficiency anemia, unspecified: Secondary | ICD-10-CM | POA: Diagnosis not present

## 2020-07-01 DIAGNOSIS — E876 Hypokalemia: Secondary | ICD-10-CM | POA: Diagnosis not present

## 2020-07-01 DIAGNOSIS — Z992 Dependence on renal dialysis: Secondary | ICD-10-CM | POA: Diagnosis not present

## 2020-07-01 DIAGNOSIS — D631 Anemia in chronic kidney disease: Secondary | ICD-10-CM | POA: Diagnosis not present

## 2020-07-03 ENCOUNTER — Other Ambulatory Visit: Payer: Self-pay

## 2020-07-03 ENCOUNTER — Emergency Department (HOSPITAL_COMMUNITY): Payer: Medicare Other

## 2020-07-03 ENCOUNTER — Encounter (HOSPITAL_COMMUNITY): Payer: Self-pay | Admitting: Emergency Medicine

## 2020-07-03 ENCOUNTER — Inpatient Hospital Stay (HOSPITAL_COMMUNITY)
Admission: EM | Admit: 2020-07-03 | Discharge: 2020-07-18 | DRG: 408 | Disposition: E | Payer: Medicare Other | Attending: Pulmonary Disease | Admitting: Pulmonary Disease

## 2020-07-03 DIAGNOSIS — D6959 Other secondary thrombocytopenia: Secondary | ICD-10-CM | POA: Diagnosis present

## 2020-07-03 DIAGNOSIS — J9601 Acute respiratory failure with hypoxia: Secondary | ICD-10-CM | POA: Diagnosis not present

## 2020-07-03 DIAGNOSIS — Z9911 Dependence on respirator [ventilator] status: Secondary | ICD-10-CM | POA: Diagnosis not present

## 2020-07-03 DIAGNOSIS — E559 Vitamin D deficiency, unspecified: Secondary | ICD-10-CM | POA: Diagnosis present

## 2020-07-03 DIAGNOSIS — N281 Cyst of kidney, acquired: Secondary | ICD-10-CM | POA: Diagnosis present

## 2020-07-03 DIAGNOSIS — F419 Anxiety disorder, unspecified: Secondary | ICD-10-CM | POA: Diagnosis present

## 2020-07-03 DIAGNOSIS — R571 Hypovolemic shock: Secondary | ICD-10-CM | POA: Diagnosis not present

## 2020-07-03 DIAGNOSIS — K8062 Calculus of gallbladder and bile duct with acute cholecystitis without obstruction: Secondary | ICD-10-CM | POA: Diagnosis not present

## 2020-07-03 DIAGNOSIS — E871 Hypo-osmolality and hyponatremia: Secondary | ICD-10-CM | POA: Diagnosis present

## 2020-07-03 DIAGNOSIS — D62 Acute posthemorrhagic anemia: Secondary | ICD-10-CM | POA: Diagnosis not present

## 2020-07-03 DIAGNOSIS — R748 Abnormal levels of other serum enzymes: Secondary | ICD-10-CM | POA: Diagnosis not present

## 2020-07-03 DIAGNOSIS — K810 Acute cholecystitis: Secondary | ICD-10-CM | POA: Diagnosis not present

## 2020-07-03 DIAGNOSIS — Z992 Dependence on renal dialysis: Secondary | ICD-10-CM

## 2020-07-03 DIAGNOSIS — I10 Essential (primary) hypertension: Secondary | ICD-10-CM | POA: Diagnosis present

## 2020-07-03 DIAGNOSIS — Z452 Encounter for adjustment and management of vascular access device: Secondary | ICD-10-CM

## 2020-07-03 DIAGNOSIS — F1011 Alcohol abuse, in remission: Secondary | ICD-10-CM | POA: Diagnosis present

## 2020-07-03 DIAGNOSIS — Z978 Presence of other specified devices: Secondary | ICD-10-CM | POA: Diagnosis not present

## 2020-07-03 DIAGNOSIS — K7030 Alcoholic cirrhosis of liver without ascites: Secondary | ICD-10-CM | POA: Diagnosis present

## 2020-07-03 DIAGNOSIS — J9584 Transfusion-related acute lung injury (TRALI): Secondary | ICD-10-CM | POA: Diagnosis not present

## 2020-07-03 DIAGNOSIS — C220 Liver cell carcinoma: Secondary | ICD-10-CM | POA: Diagnosis not present

## 2020-07-03 DIAGNOSIS — K661 Hemoperitoneum: Secondary | ICD-10-CM | POA: Diagnosis not present

## 2020-07-03 DIAGNOSIS — K8042 Calculus of bile duct with acute cholecystitis without obstruction: Secondary | ICD-10-CM | POA: Diagnosis present

## 2020-07-03 DIAGNOSIS — Z20822 Contact with and (suspected) exposure to covid-19: Secondary | ICD-10-CM | POA: Diagnosis present

## 2020-07-03 DIAGNOSIS — E875 Hyperkalemia: Secondary | ICD-10-CM | POA: Diagnosis not present

## 2020-07-03 DIAGNOSIS — K567 Ileus, unspecified: Secondary | ICD-10-CM | POA: Diagnosis not present

## 2020-07-03 DIAGNOSIS — K660 Peritoneal adhesions (postprocedural) (postinfection): Secondary | ICD-10-CM | POA: Diagnosis present

## 2020-07-03 DIAGNOSIS — R945 Abnormal results of liver function studies: Secondary | ICD-10-CM | POA: Diagnosis not present

## 2020-07-03 DIAGNOSIS — K8050 Calculus of bile duct without cholangitis or cholecystitis without obstruction: Secondary | ICD-10-CM

## 2020-07-03 DIAGNOSIS — Z01818 Encounter for other preprocedural examination: Secondary | ICD-10-CM

## 2020-07-03 DIAGNOSIS — J9600 Acute respiratory failure, unspecified whether with hypoxia or hypercapnia: Secondary | ICD-10-CM

## 2020-07-03 DIAGNOSIS — K7200 Acute and subacute hepatic failure without coma: Secondary | ICD-10-CM | POA: Diagnosis not present

## 2020-07-03 DIAGNOSIS — D509 Iron deficiency anemia, unspecified: Secondary | ICD-10-CM | POA: Diagnosis not present

## 2020-07-03 DIAGNOSIS — R680 Hypothermia, not associated with low environmental temperature: Secondary | ICD-10-CM | POA: Diagnosis not present

## 2020-07-03 DIAGNOSIS — Y838 Other surgical procedures as the cause of abnormal reaction of the patient, or of later complication, without mention of misadventure at the time of the procedure: Secondary | ICD-10-CM | POA: Diagnosis present

## 2020-07-03 DIAGNOSIS — K8043 Calculus of bile duct with acute cholecystitis with obstruction: Secondary | ICD-10-CM | POA: Diagnosis not present

## 2020-07-03 DIAGNOSIS — I9589 Other hypotension: Secondary | ICD-10-CM | POA: Diagnosis not present

## 2020-07-03 DIAGNOSIS — E878 Other disorders of electrolyte and fluid balance, not elsewhere classified: Secondary | ICD-10-CM | POA: Diagnosis not present

## 2020-07-03 DIAGNOSIS — N250 Renal osteodystrophy: Secondary | ICD-10-CM | POA: Diagnosis not present

## 2020-07-03 DIAGNOSIS — E440 Moderate protein-calorie malnutrition: Secondary | ICD-10-CM | POA: Diagnosis present

## 2020-07-03 DIAGNOSIS — I959 Hypotension, unspecified: Secondary | ICD-10-CM | POA: Diagnosis not present

## 2020-07-03 DIAGNOSIS — Z66 Do not resuscitate: Secondary | ICD-10-CM | POA: Diagnosis not present

## 2020-07-03 DIAGNOSIS — Z6820 Body mass index (BMI) 20.0-20.9, adult: Secondary | ICD-10-CM

## 2020-07-03 DIAGNOSIS — G9341 Metabolic encephalopathy: Secondary | ICD-10-CM | POA: Diagnosis not present

## 2020-07-03 DIAGNOSIS — K7460 Unspecified cirrhosis of liver: Secondary | ICD-10-CM | POA: Diagnosis not present

## 2020-07-03 DIAGNOSIS — I700 Atherosclerosis of aorta: Secondary | ICD-10-CM | POA: Diagnosis not present

## 2020-07-03 DIAGNOSIS — E8889 Other specified metabolic disorders: Secondary | ICD-10-CM | POA: Diagnosis present

## 2020-07-03 DIAGNOSIS — Z800 Family history of malignant neoplasm of digestive organs: Secondary | ICD-10-CM

## 2020-07-03 DIAGNOSIS — N186 End stage renal disease: Secondary | ICD-10-CM | POA: Diagnosis not present

## 2020-07-03 DIAGNOSIS — B49 Unspecified mycosis: Secondary | ICD-10-CM

## 2020-07-03 DIAGNOSIS — K828 Other specified diseases of gallbladder: Secondary | ICD-10-CM | POA: Diagnosis present

## 2020-07-03 DIAGNOSIS — R578 Other shock: Secondary | ICD-10-CM | POA: Diagnosis not present

## 2020-07-03 DIAGNOSIS — R634 Abnormal weight loss: Secondary | ICD-10-CM | POA: Diagnosis present

## 2020-07-03 DIAGNOSIS — Z09 Encounter for follow-up examination after completed treatment for conditions other than malignant neoplasm: Secondary | ICD-10-CM

## 2020-07-03 DIAGNOSIS — Z4659 Encounter for fitting and adjustment of other gastrointestinal appliance and device: Secondary | ICD-10-CM

## 2020-07-03 DIAGNOSIS — R6521 Severe sepsis with septic shock: Secondary | ICD-10-CM | POA: Diagnosis not present

## 2020-07-03 DIAGNOSIS — K921 Melena: Secondary | ICD-10-CM | POA: Diagnosis not present

## 2020-07-03 DIAGNOSIS — J9811 Atelectasis: Secondary | ICD-10-CM | POA: Diagnosis not present

## 2020-07-03 DIAGNOSIS — D684 Acquired coagulation factor deficiency: Secondary | ICD-10-CM | POA: Diagnosis present

## 2020-07-03 DIAGNOSIS — R918 Other nonspecific abnormal finding of lung field: Secondary | ICD-10-CM | POA: Diagnosis not present

## 2020-07-03 DIAGNOSIS — D689 Coagulation defect, unspecified: Secondary | ICD-10-CM | POA: Diagnosis not present

## 2020-07-03 DIAGNOSIS — B377 Candidal sepsis: Secondary | ICD-10-CM | POA: Diagnosis not present

## 2020-07-03 DIAGNOSIS — Z79899 Other long term (current) drug therapy: Secondary | ICD-10-CM

## 2020-07-03 DIAGNOSIS — K8020 Calculus of gallbladder without cholecystitis without obstruction: Secondary | ICD-10-CM | POA: Diagnosis not present

## 2020-07-03 DIAGNOSIS — D631 Anemia in chronic kidney disease: Secondary | ICD-10-CM | POA: Diagnosis not present

## 2020-07-03 DIAGNOSIS — K8070 Calculus of gallbladder and bile duct without cholecystitis without obstruction: Secondary | ICD-10-CM | POA: Diagnosis not present

## 2020-07-03 DIAGNOSIS — E162 Hypoglycemia, unspecified: Secondary | ICD-10-CM | POA: Diagnosis not present

## 2020-07-03 DIAGNOSIS — N2581 Secondary hyperparathyroidism of renal origin: Secondary | ICD-10-CM | POA: Diagnosis present

## 2020-07-03 DIAGNOSIS — K819 Cholecystitis, unspecified: Secondary | ICD-10-CM

## 2020-07-03 DIAGNOSIS — R58 Hemorrhage, not elsewhere classified: Secondary | ICD-10-CM

## 2020-07-03 DIAGNOSIS — E872 Acidosis: Secondary | ICD-10-CM | POA: Diagnosis not present

## 2020-07-03 DIAGNOSIS — Z7189 Other specified counseling: Secondary | ICD-10-CM | POA: Diagnosis not present

## 2020-07-03 DIAGNOSIS — F418 Other specified anxiety disorders: Secondary | ICD-10-CM | POA: Diagnosis present

## 2020-07-03 DIAGNOSIS — I120 Hypertensive chronic kidney disease with stage 5 chronic kidney disease or end stage renal disease: Secondary | ICD-10-CM | POA: Diagnosis present

## 2020-07-03 DIAGNOSIS — E876 Hypokalemia: Secondary | ICD-10-CM | POA: Diagnosis not present

## 2020-07-03 DIAGNOSIS — R29818 Other symptoms and signs involving the nervous system: Secondary | ICD-10-CM | POA: Diagnosis not present

## 2020-07-03 DIAGNOSIS — D696 Thrombocytopenia, unspecified: Secondary | ICD-10-CM | POA: Diagnosis not present

## 2020-07-03 DIAGNOSIS — K91840 Postprocedural hemorrhage and hematoma of a digestive system organ or structure following a digestive system procedure: Secondary | ICD-10-CM | POA: Diagnosis present

## 2020-07-03 DIAGNOSIS — J90 Pleural effusion, not elsewhere classified: Secondary | ICD-10-CM | POA: Diagnosis not present

## 2020-07-03 DIAGNOSIS — E785 Hyperlipidemia, unspecified: Secondary | ICD-10-CM | POA: Diagnosis present

## 2020-07-03 DIAGNOSIS — Z8619 Personal history of other infectious and parasitic diseases: Secondary | ICD-10-CM

## 2020-07-03 DIAGNOSIS — I708 Atherosclerosis of other arteries: Secondary | ICD-10-CM | POA: Diagnosis not present

## 2020-07-03 DIAGNOSIS — R001 Bradycardia, unspecified: Secondary | ICD-10-CM | POA: Diagnosis not present

## 2020-07-03 DIAGNOSIS — Z801 Family history of malignant neoplasm of trachea, bronchus and lung: Secondary | ICD-10-CM

## 2020-07-03 DIAGNOSIS — Z8719 Personal history of other diseases of the digestive system: Secondary | ICD-10-CM

## 2020-07-03 DIAGNOSIS — Z90710 Acquired absence of both cervix and uterus: Secondary | ICD-10-CM

## 2020-07-03 DIAGNOSIS — R188 Other ascites: Secondary | ICD-10-CM | POA: Diagnosis not present

## 2020-07-03 DIAGNOSIS — J9690 Respiratory failure, unspecified, unspecified whether with hypoxia or hypercapnia: Secondary | ICD-10-CM | POA: Diagnosis not present

## 2020-07-03 DIAGNOSIS — M8580 Other specified disorders of bone density and structure, unspecified site: Secondary | ICD-10-CM | POA: Diagnosis present

## 2020-07-03 DIAGNOSIS — Z419 Encounter for procedure for purposes other than remedying health state, unspecified: Secondary | ICD-10-CM

## 2020-07-03 DIAGNOSIS — K8040 Calculus of bile duct with cholecystitis, unspecified, without obstruction: Secondary | ICD-10-CM | POA: Diagnosis not present

## 2020-07-03 DIAGNOSIS — Z8505 Personal history of malignant neoplasm of liver: Secondary | ICD-10-CM

## 2020-07-03 DIAGNOSIS — K5730 Diverticulosis of large intestine without perforation or abscess without bleeding: Secondary | ICD-10-CM | POA: Diagnosis not present

## 2020-07-03 DIAGNOSIS — J9809 Other diseases of bronchus, not elsewhere classified: Secondary | ICD-10-CM | POA: Diagnosis not present

## 2020-07-03 DIAGNOSIS — Z803 Family history of malignant neoplasm of breast: Secondary | ICD-10-CM

## 2020-07-03 DIAGNOSIS — T17908A Unspecified foreign body in respiratory tract, part unspecified causing other injury, initial encounter: Secondary | ICD-10-CM

## 2020-07-03 DIAGNOSIS — A419 Sepsis, unspecified organism: Secondary | ICD-10-CM | POA: Diagnosis not present

## 2020-07-03 DIAGNOSIS — Z4682 Encounter for fitting and adjustment of non-vascular catheter: Secondary | ICD-10-CM | POA: Diagnosis not present

## 2020-07-03 HISTORY — DX: End stage renal disease: N18.6

## 2020-07-03 HISTORY — DX: Dependence on renal dialysis: Z99.2

## 2020-07-03 LAB — COMPREHENSIVE METABOLIC PANEL
ALT: 116 U/L — ABNORMAL HIGH (ref 0–44)
AST: 169 U/L — ABNORMAL HIGH (ref 15–41)
Albumin: 2.9 g/dL — ABNORMAL LOW (ref 3.5–5.0)
Alkaline Phosphatase: 147 U/L — ABNORMAL HIGH (ref 38–126)
Anion gap: 13 (ref 5–15)
BUN: 12 mg/dL (ref 8–23)
CO2: 27 mmol/L (ref 22–32)
Calcium: 8.9 mg/dL (ref 8.9–10.3)
Chloride: 93 mmol/L — ABNORMAL LOW (ref 98–111)
Creatinine, Ser: 3.53 mg/dL — ABNORMAL HIGH (ref 0.44–1.00)
GFR calc Af Amer: 14 mL/min — ABNORMAL LOW (ref >60)
GFR calc non Af Amer: 12 mL/min — ABNORMAL LOW (ref >60)
Glucose, Bld: 101 mg/dL — ABNORMAL HIGH (ref 70–99)
Potassium: 3.8 mmol/L (ref 3.5–5.1)
Sodium: 133 mmol/L — ABNORMAL LOW (ref 135–145)
Total Bilirubin: 5 mg/dL — ABNORMAL HIGH (ref 0.3–1.2)
Total Protein: 7.3 g/dL (ref 6.5–8.1)

## 2020-07-03 LAB — SARS CORONAVIRUS 2 BY RT PCR (HOSPITAL ORDER, PERFORMED IN ~~LOC~~ HOSPITAL LAB): SARS Coronavirus 2: NEGATIVE

## 2020-07-03 LAB — CBC
HCT: 28.8 % — ABNORMAL LOW (ref 36.0–46.0)
Hemoglobin: 9.4 g/dL — ABNORMAL LOW (ref 12.0–15.0)
MCH: 29.1 pg (ref 26.0–34.0)
MCHC: 32.6 g/dL (ref 30.0–36.0)
MCV: 89.2 fL (ref 80.0–100.0)
Platelets: 107 10*3/uL — ABNORMAL LOW (ref 150–400)
RBC: 3.23 MIL/uL — ABNORMAL LOW (ref 3.87–5.11)
RDW: 16.5 % — ABNORMAL HIGH (ref 11.5–15.5)
WBC: 9.3 10*3/uL (ref 4.0–10.5)
nRBC: 0 % (ref 0.0–0.2)

## 2020-07-03 LAB — LIPASE, BLOOD: Lipase: 34 U/L (ref 11–51)

## 2020-07-03 MED ORDER — ONDANSETRON HCL 4 MG/2ML IJ SOLN: 4.0000 mg | Freq: Once | INTRAMUSCULAR | Status: DC

## 2020-07-03 MED ORDER — MORPHINE SULFATE (PF) 4 MG/ML IV SOLN: 4.0000 mg | Freq: Once | INTRAVENOUS | Status: DC

## 2020-07-03 MED ORDER — HEPARIN SODIUM (PORCINE) 5000 UNIT/ML IJ SOLN
5000.0000 [IU] | Freq: Three times a day (TID) | INTRAMUSCULAR | Status: DC
  Administered 2020-07-03 – 2020-07-05 (×5): 5000 [IU] via SUBCUTANEOUS
  Filled 2020-07-03 (×5): qty 1

## 2020-07-03 MED ORDER — SODIUM CHLORIDE 0.9% FLUSH
3.0000 mL | Freq: Once | INTRAVENOUS | Status: AC
  Administered 2020-07-03: 3 mL via INTRAVENOUS

## 2020-07-03 MED ORDER — ONDANSETRON HCL 4 MG PO TABS: 4.0000 mg | ORAL_TABLET | Freq: Four times a day (QID) | ORAL | Status: DC | PRN

## 2020-07-03 MED ORDER — AMLODIPINE BESYLATE 10 MG PO TABS
10.0000 mg | ORAL_TABLET | Freq: Every day | ORAL | Status: DC
  Administered 2020-07-04: 10 mg via ORAL
  Filled 2020-07-03: qty 1

## 2020-07-03 MED ORDER — HYDRALAZINE HCL 50 MG PO TABS
100.0000 mg | ORAL_TABLET | Freq: Three times a day (TID) | ORAL | Status: DC
  Administered 2020-07-03 – 2020-07-04 (×3): 100 mg via ORAL
  Filled 2020-07-03 (×4): qty 2

## 2020-07-03 MED ORDER — PROPYLENE GLYCOL 0.6 % OP SOLN: 1.0000 [drp] | Freq: Every day | OPHTHALMIC | Status: DC

## 2020-07-03 MED ORDER — ONDANSETRON HCL 4 MG/2ML IJ SOLN
4.0000 mg | Freq: Once | INTRAMUSCULAR | Status: AC
  Administered 2020-07-03: 4 mg via INTRAVENOUS
  Filled 2020-07-03: qty 2

## 2020-07-03 MED ORDER — POLYETHYLENE GLYCOL 3350 17 G PO PACK: 17.0000 g | PACK | Freq: Every day | ORAL | Status: DC | PRN

## 2020-07-03 MED ORDER — PROPRANOLOL HCL 40 MG PO TABS
40.0000 mg | ORAL_TABLET | Freq: Two times a day (BID) | ORAL | Status: DC
  Administered 2020-07-03 – 2020-07-04 (×3): 40 mg via ORAL
  Filled 2020-07-03 (×4): qty 1

## 2020-07-03 MED ORDER — PIPERACILLIN-TAZOBACTAM 3.375 G IVPB
3.3750 g | Freq: Once | INTRAVENOUS | Status: AC
  Administered 2020-07-03: 3.375 g via INTRAVENOUS
  Filled 2020-07-03: qty 50

## 2020-07-03 MED ORDER — ALPRAZOLAM 0.5 MG PO TABS
0.5000 mg | ORAL_TABLET | Freq: Every day | ORAL | Status: DC
  Administered 2020-07-03 – 2020-07-04 (×2): 0.5 mg via ORAL
  Filled 2020-07-03: qty 2
  Filled 2020-07-03: qty 1

## 2020-07-03 MED ORDER — KETOTIFEN FUMARATE 0.025 % OP SOLN
1.0000 [drp] | Freq: Two times a day (BID) | OPHTHALMIC | Status: DC
  Administered 2020-07-04 – 2020-07-13 (×16): 1 [drp] via OPHTHALMIC
  Filled 2020-07-03 (×2): qty 5

## 2020-07-03 MED ORDER — ONDANSETRON HCL 4 MG/2ML IJ SOLN
4.0000 mg | Freq: Four times a day (QID) | INTRAMUSCULAR | Status: DC | PRN
  Administered 2020-07-06: 4 mg via INTRAVENOUS
  Filled 2020-07-03: qty 2

## 2020-07-03 MED ORDER — ACETAMINOPHEN 650 MG RE SUPP: 650.0000 mg | Freq: Four times a day (QID) | RECTAL | Status: DC | PRN

## 2020-07-03 MED ORDER — RENA-VITE PO TABS
1.0000 | ORAL_TABLET | Freq: Every day | ORAL | Status: DC
  Administered 2020-07-03 – 2020-07-04 (×2): 1 via ORAL
  Filled 2020-07-03 (×2): qty 1

## 2020-07-03 MED ORDER — PIPERACILLIN-TAZOBACTAM IN DEX 2-0.25 GM/50ML IV SOLN
2.2500 g | Freq: Three times a day (TID) | INTRAVENOUS | Status: DC
  Administered 2020-07-04 – 2020-07-06 (×7): 2.25 g via INTRAVENOUS
  Filled 2020-07-03 (×9): qty 50

## 2020-07-03 MED ORDER — ACETAMINOPHEN 325 MG PO TABS: 650.0000 mg | ORAL_TABLET | Freq: Four times a day (QID) | ORAL | Status: DC | PRN

## 2020-07-03 MED ORDER — MORPHINE SULFATE (PF) 4 MG/ML IV SOLN: 4.0000 mg | INTRAVENOUS | Status: DC | PRN

## 2020-07-03 MED ORDER — MORPHINE SULFATE (PF) 4 MG/ML IV SOLN
4.0000 mg | Freq: Once | INTRAVENOUS | Status: AC
  Administered 2020-07-03: 4 mg via INTRAVENOUS
  Filled 2020-07-03: qty 1

## 2020-07-03 MED ORDER — MORPHINE SULFATE (PF) 2 MG/ML IV SOLN
2.0000 mg | INTRAVENOUS | Status: DC | PRN
  Administered 2020-07-05: 2 mg via INTRAVENOUS
  Filled 2020-07-03: qty 1

## 2020-07-03 MED ORDER — LACTULOSE 10 GM/15ML PO SOLN
20.0000 g | Freq: Every day | ORAL | Status: DC
  Filled 2020-07-03: qty 30

## 2020-07-03 NOTE — Progress Notes (Signed)
Pharmacy Antibiotic Note  Sarah Harmon is a 74 y.o. female admitted on 07/06/2020 with Cholecystitis.  Pharmacy has been consulted for Zosyn dosing.      Temp (24hrs), Avg:99.9 F (37.7 C), Min:99.9 F (37.7 C), Max:99.9 F (37.7 C)  Recent Labs  Lab 06/24/2020 1506  WBC 9.3  CREATININE 3.53*    CrCl cannot be calculated (Unknown ideal weight.).    No Known Allergies  Antimicrobials this admission: 7/17 Zosyn >>    Dose adjustments this admission: N/a  Microbiology results: Pending   Plan:  - Zosyn 3.375g IV x 1 dose over 30 min followed by Zosyn 2.25g IV every 8 hours  - Monitor patients HD sessions  - De-escalate ABX when appropriate   Thank you for allowing pharmacy to be a part of this patient's care.  Duanne Limerick PharmD. BCPS 07/14/2020 9:47 PM

## 2020-07-03 NOTE — H&P (Signed)
History and Physical    Sarah Harmon:[id number] DOB: 09-20-1946 DOA: 07/04/2020  PCP: Biagio Borg, MD  Patient coming from: Home   Chief Complaint:  Abdominal pain  HPI:     74 year old female with past medical history of hypertension, hyperlipidemia, depression, anxiety, hepatocellular carcinoma (dx 02/2015 S/P thermoablation follows with Dr. Burr Medico), ESRD (Tues, Thurs, Sat), alcoholic liver cirrhosis who presents to [facility] emergency department complaints of severe abdominal pain.  Explains that morning, shortly after arriving home from hemodialysis she ate some grits for breakfast.  Short period of time after finishing breakfast she began to experience abdominal pain.  Patient describes his pain is severe in intensity, sore in quality and generalized in location.  Pain radiates diffusely.  Patient is unable to identify any alleviating or exacerbating factors.  Patient planes of some nausea but no vomiting.  Patient explains that several days ago she had a similar less severe episode shortly after eating a meal.  Upon further questioning patient also complains of progressively poor appetite as of late with a unspecified amount of unintentional weight loss.  Patient denies malaise, fevers, sick contacts, recent travel or confirmed contact with COVID-19 infection.  Of note, patient underwent routine right upper quadrant ultrasound surveillance which incidentally identified a soft tissue lesion in the gallbladder lumen.  Patient was referred to [facility] where MRI was recommended prior to proceeding with surgical intervention however the patient is now presenting here today.  Upon evaluation at [facility] emergency department, MRCP was performed revealing evidence of both choledocholithiasis and cholecystitis.  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  Provider discussed the case with Dr. Carlean Purl who stated that he will see the patient in the morning and likely proceed with ERCP.  Case was also discussed with Dr. Gershon Crane with general surgery who said that general surgery does not need to be involved yet at this time until at least Monday.  Intravenous Zosyn was initiated in the hospice group was then called to assess the patient for admission the hospital.   Review of Systems: A 10-system review of systems has been performed and all systems are negative with the exception of what is listed in the HPI.    Past Medical History:  Diagnosis Date  . ALCOHOL ABUSE 07/13/2010  . ANXIETY 12/22/2009  . Cancer [facility])    liver cancer  . DEPRESSION 12/22/2009  . 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 DJD (degenerative joint disease), cervical    patient denies on preop of 05/10/15   . FATIGUE 12/23/2010  . GALLSTONES 12/22/2009  . Gallstones   . Headache    occasional   . HYPERLIPIDEMIA 12/22/2009  . HYPERTENSION 12/22/2009  . HYPONATREMIA 12/22/2009  . 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 OSTEOPENIA 12/22/2009  . Pancytopenia 07/13/2010  . RENAL CYST, LEFT 12/22/2009  . VITAMIN D DEFICIENCY 12/23/2010  . WEIGHT LOSS 12/23/2010    Past Surgical History:  Procedure Laterality Date  . A/V FISTULAGRAM N/A 10/25/2018   Procedure: A/V FISTULAGRAM;  Surgeon: Elam Dutch, MD;  Location: [place] [facility];  Service: Cardiovascular;  Laterality: N/A;  . ABDOMINAL HYSTERECTOMY    . APPENDECTOMY    . AV FISTULA PLACEMENT Left 05/21/2018   Procedure: INSERTION OF ARTERIOVENOUS (AV) GORE-TEX GRAFT UPPER ARM;  Surgeon: Elam Dutch, MD;  Location: [place];  Service: Vascular;  Laterality: Left;  .  AV FISTULA PLACEMENT Left 06/03/2018   Procedure: INSERTION OF ARTERIOVENOUS (AV) GORE-TEX 92mm x 10cm GRAFT INTO LEFT ARM;  Surgeon: Elam Dutch, MD;  Location: [place];  Service: Vascular;  Laterality: Left;  . IR GENERIC HISTORICAL  10/18/2016   IR  RADIOLOGIST EVAL & MGMT 10/18/2016 [name], MD [facility] INTERV RAD  . IR RADIOLOGIST EVAL & MGMT  05/30/2017  . IR RADIOLOGIST EVAL & MGMT  08/28/2018  . IR RADIOLOGIST EVAL & MGMT  10/08/2019  . IR RADIOLOGIST EVAL & MGMT  10/30/2019  . surgery for liver ca    . THROMBECTOMY AND REVISION OF ARTERIOVENTOUS (AV) GORETEX  GRAFT Left 06/03/2018   Procedure: THROMBECTOMY AND REVISION OF ARTERIOVENTOUS (AV) GORETEX  GRAFT ARM;  Surgeon: Elam Dutch, MD;  Location: [place];  Service: Vascular;  Laterality: Left;     reports that she has never smoked. She has never used smokeless tobacco. She reports previous alcohol use of about 3.0 - 4.0 standard drinks of alcohol per week. She reports that she does not use drugs.  No Known Allergies  Family History  Problem Relation Age of Onset  . Lung cancer Brother   . Breast cancer Other   . Breast cancer Sister   . Gastric cancer Sister   . Lung cancer Sister   . Colon cancer Neg Hx   . Colon polyps Neg Hx   . Rectal cancer Neg Hx   . Stomach cancer Neg Hx   . Esophageal cancer Neg Hx      Prior to Admission medications   Medication Sig Start Date End Date Taking? Authorizing Provider  Acetaminophen (TYLENOL PO) Take 1 tablet by mouth 3 (three) times a week.   Yes [provider]  ALPRAZolam (XANAX) 0.5 MG tablet TAKE 1 TABLET BY MOUTH ONCE DAILY AT BEDTIME Patient taking differently: Take 0.5 mg by mouth at bedtime.  06/23/20  Yes Biagio Borg, MD  amLODipine (NORVASC) 10 MG tablet Take 1 tablet by mouth once daily Patient taking differently: Take 10 mg by mouth daily.  06/04/20  Yes Biagio Borg, MD  B Complex-C-Zn-Folic Acid (DIALYVITE 209 WITH ZINC) 0.8 MG TABS Take 1 tablet by mouth daily. 09/29/18  Yes [provider]  CONSTULOSE 10 GM/15ML solution TAKE 30 ML BY MOUTH ONCE DAILY Patient taking differently: Take 20 g by mouth daily.  05/19/20  Yes Biagio Borg, MD  folic acid (FOLVITE) 1 MG tablet Take 1 tablet by  mouth once daily Patient taking differently: Take 1 mg by mouth daily.  01/26/20  Yes Biagio Borg, MD  hydrALAZINE (APRESOLINE) 100 MG tablet TAKE 1 TABLET BY MOUTH THREE TIMES DAILY Patient taking differently: Take 100 mg by mouth 3 (three) times daily.  01/19/20  Yes Biagio Borg, MD  ketotifen (ZADITOR) 0.025 % ophthalmic solution Place 1 drop into both eyes 2 (two) times daily.   Yes [provider]  lidocaine-prilocaine (EMLA) cream Apply 1 application topically daily as needed (use before dialysis).  10/11/18  Yes [provider]  propranolol (INDERAL) 40 MG tablet Take 1 tablet by mouth twice daily Patient taking differently: Take 40 mg by mouth 2 (two) times daily.  04/06/20  Yes Biagio Borg, MD  Propylene Glycol (SYSTANE BALANCE) 0.6 % SOLN Place 1 drop into both eyes daily.   Yes [provider]  Darbepoetin Alfa (ARANESP) 100 MCG/0.5ML SOSY injection Inject 0.5 mLs (100 mcg total) into the vein every Thursday with  hemodialysis. 07/25/18   Cherene Altes, MD  Methoxy PEG-Epoetin Beta (MIRCERA IJ) Mircera 04/06/20 04/05/21  [provider]    Physical Exam: Vitals:   06/20/2020 1506  BP: (!) 141/56  Pulse: 76  Resp: 17  Temp: 99.9 F (37.7 C)  TempSrc: Oral  SpO2: 97%    Constitutional: Acute alert and oriented x3, patient is in distress due to abdominal pain. Skin: no rashes, no lesions, slightly poor skin turgor noted Eyes: Pupils are equally reactive to light.  Notable scleral icterus with additional conjunctival pallor. ENMT: Moist mucous membranes noted.  Posterior pharynx clear of any exudate or lesions.   Neck: normal, supple, no masses, no thyromegaly.  No evidence of jugular venous distension.   Respiratory: clear to auscultation bilaterally, no wheezing, no crackles. Normal respiratory effort. No accessory muscle use.  Cardiovascular: 3 out of 6 murmur noted, regular rate and rhythm. No extremity edema. 2+ pedal pulses. No carotid  bruits.  Chest:   Nontender without crepitus or deformity.   Back:   Nontender without crepitus or deformity. Abdomen: Exquisite generalized abdominal tenderness, noted to be worst in the epigastric and right upper quadrant regions.  Positive bowel signs noted.  Abdomen is soft.  No obvious intra-abdominal masses identified.   Musculoskeletal: No joint deformity upper and lower extremities. Good ROM, no contractures. Normal muscle tone.  Neurologic: CN 2-12 grossly intact. Sensation intact, strength noted to be 5 out of 5 in all 4 extremities.  Patient is following all commands.  Patient is responsive to verbal stimuli.   Psychiatric: Patient presents as a normal mood with appropriate affect.  Patient seems to possess insight as to theircurrent situation.     Labs on Admission: I have personally reviewed following labs and imaging studies -   CBC: Recent Labs  Lab 06/30/2020 1506  WBC 9.3  HGB 9.4*  HCT 28.8*  MCV 89.2  PLT 093*   Basic Metabolic Panel: Recent Labs  Lab 07/11/2020 1506  NA 133*  K 3.8  CL 93*  CO2 27  GLUCOSE 101*  BUN 12  CREATININE 3.53*  CALCIUM 8.9   GFR: CrCl cannot be calculated (Unknown ideal weight.). Liver Function Tests: Recent Labs  Lab 06/27/2020 1506  AST 169*  ALT 116*  ALKPHOS 147*  BILITOT 5.0*  PROT 7.3  ALBUMIN 2.9*   Recent Labs  Lab 07/10/2020 1506  LIPASE 34   No results for input(s): AMMONIA in the last 168 hours. Coagulation Profile: No results for input(s): INR, PROTIME in the last 168 hours. Cardiac Enzymes: No results for input(s): CKTOTAL, CKMB, CKMBINDEX, TROPONINI in the last 168 hours. BNP (last 3 results) No results for input(s): PROBNP in the last 8760 hours. HbA1C: No results for input(s): HGBA1C in the last 72 hours. CBG: No results for input(s): GLUCAP in the last 168 hours. Lipid Profile: No results for input(s): CHOL, HDL, LDLCALC, TRIG, CHOLHDL, LDLDIRECT in the last 72 hours. Thyroid Function Tests: No  results for input(s): TSH, T4TOTAL, FREET4, T3FREE, THYROIDAB in the last 72 hours. Anemia Panel: No results for input(s): VITAMINB12, FOLATE, FERRITIN, TIBC, IRON, RETICCTPCT in the last 72 hours. Urine analysis:    Component Value Date/Time   COLORURINE AMBER (A) 07/14/2018 0104   APPEARANCEUR HAZY (A) 07/14/2018 0104   LABSPEC 1.019 07/14/2018 0104   PHURINE 5.0 07/14/2018 0104   GLUCOSEU NEGATIVE 07/14/2018 0104   GLUCOSEU NEGATIVE 09/04/2017 0943   HGBUR NEGATIVE 07/14/2018 0104   BILIRUBINUR NEGATIVE 07/14/2018 0104  KETONESUR NEGATIVE 07/14/2018 0104   PROTEINUR >=300 (A) 07/14/2018 0104   UROBILINOGEN 0.2 09/04/2017 0943   NITRITE NEGATIVE 07/14/2018 0104   LEUKOCYTESUR NEGATIVE 07/14/2018 0104    Radiological Exams on Admission - Personally Reviewed: No results found.   Assessment/Plan Principal Problem:   Choledocholithiasis with acute cholecystitis   Patient presenting with severe abdominal pain with initial work-up revealing multiple deranged hepatic function indices including markedly elevated alkaline phosphatase, AST and ALT as well as total bilirubin of 5.0.  Patient is found to have choledocholithiasis as well as evidence of acute cholecystitis  Acute cholecystitis is likely secondary to concurrent gallbladder mass identified on MRCP performed today.  Intravenous Zosyn has been initiated which will be continued.  Blood cultures have been ordered  Case discussed between ER provider and Dr. Carlean Purl, GI to see patient in the morning and likely proceed with ERCP  Patient n.p.o. with sips of clears for now.  Dr. Gershon Crane with General surgery was contacted but stated that they do not need to be involved at this point  PRN opiate-based analgesics for substantial associated pain  Active Problems: Gallbladder mass   Please see assessment and plan above.   Essential hypertension   Continue home regimen of antihypertensives    ESRD (end stage renal  disease) [facility])   Patient undergoes hemodialysis Tuesday Thursday and Saturday  We will consult nephrology for continuation of hemodialysis as patient will likely remain hospitalized on Monday.    Anemia due to end stage renal disease (HCC)   No clinical evidence of bleeding  Monitoring hemoglobin and hematocrit with serial CBCs    Alcoholic cirrhosis of liver without ascites (HCC)   Supportive care    Hepatocellular carcinoma [facility])  See assessment and plan above  Gallbladder mass identified on both ultrasound and MRI imaging likely related to diagnosis of hepatocellular carcinoma  Patient follows with Dr. Burr Medico as an outpatient.   Code Status:  Full code Family Communication: Daughter is at bedside and has been updated on plan of care  Status is: Inpatient  Remains inpatient appropriate because:Ongoing diagnostic testing needed not appropriate for outpatient work up, IV treatments appropriate due to intensity of illness or inability to take PO and Inpatient level of care appropriate due to severity of illness   Dispo: The patient is from: Home              Anticipated d/c is to: Home              Anticipated d/c date is: > 3 days              Patient currently is not medically stable to d/c.        Vernelle Emerald MD [facility] Pager [phone]  If 7PM-7AM, please contact night-coverage [email or web address] Use universal [place] password for that web site. If you do not have the password, please call the hospital operator.  06/28/2020, 9:42 PM

## 2020-07-03 NOTE — ED Provider Notes (Signed)
[facility] EMERGENCY DEPARTMENT Provider Note   CSN: [id number] Arrival date & time: 07/14/2020  1424     History Chief Complaint  Patient presents with  . Emesis  . Nausea  . Abdominal Pain    Sarah Harmon is a 74 y.o. female.  Patient is a 74 year old female with history of hepatocellular carcinoma, hypertension, hyperlipidemia.  She presents today for evaluation of abdominal pain, nausea, and vomiting.  This has worsened over the past several days.  Patient recently had an ultrasound performed that showed concern for gallbladder neoplasm.  She was supposed to have an MRI in the near future, however this has not happened yet.  She describes feeling fevered, but has not checked her temperature.  She denies any diarrhea.  The history is provided by the patient.       Past Medical History:  Diagnosis Date  . ALCOHOL ABUSE 07/13/2010  . ANXIETY 12/22/2009  . Cancer [facility])    liver cancer  . DEPRESSION 12/22/2009  . 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 DJD (degenerative joint disease), cervical    patient denies on preop of 05/10/15   . FATIGUE 12/23/2010  . GALLSTONES 12/22/2009  . Gallstones   . Headache    occasional   . HYPERLIPIDEMIA 12/22/2009  . HYPERTENSION 12/22/2009  . HYPONATREMIA 12/22/2009  . 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 OSTEOPENIA 12/22/2009  . Pancytopenia 07/13/2010  . RENAL CYST, LEFT 12/22/2009  . VITAMIN D DEFICIENCY 12/23/2010  . WEIGHT LOSS 12/23/2010    Patient Active Problem List   Diagnosis Date Noted  . Left upper extremity swelling 08/07/2018  . Left leg swelling 08/07/2018  . Malnutrition of moderate degree 07/16/2018  . Anemia due to end stage renal disease ([place]) 07/14/2018  . ESRD (end stage renal disease) ([place])   . Hyperkalemia 09/10/2017  . Acute metabolic encephalopathy 54/00/8676  . Elevated troponin 09/10/2017  . Slurred speech 09/10/2017  . Cancer, hepatocellular ([place])   . Cirrhosis of liver ([place]) 03/04/2015  .  Thrombocytopenia ([place]) 03/04/2015  . Bradycardia 03/04/2015  . Acute renal failure superimposed on stage 3 chronic kidney disease ([place]) 03/04/2015  . Liver mass, right lobe   . Neoplasm /cancer ([place])   . Hepatocellular carcinoma ([place])   . Alcoholic cirrhosis of liver without ascites ([place])   . Liver mass   . Left kidney mass   . Alcoholic liver failure ([place])   . SIADH (syndrome of inappropriate ADH production) ([place])   . Hyponatremia 02/26/2015  . Rash and nonspecific skin eruption 07/29/2013  . Impaired glucose tolerance 12/27/2012  . Fatigue 12/26/2011  . Preventative health care 06/25/2011  . VITAMIN D DEFICIENCY 12/23/2010  . WEIGHT LOSS 12/23/2010  . 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 Pancytopenia ([place]) 07/13/2010  . ALCOHOL ABUSE 07/13/2010  . 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 Hyperlipidemia 12/22/2009  . Anxiety state 12/22/2009  . Depression 12/22/2009  . Essential hypertension 12/22/2009  . GALLSTONES 12/22/2009  . RENAL CYST, LEFT 12/22/2009  . Osteoporosis 12/22/2009  . OTHER NONSPECIFIC FINDING EXAMINATION OF URINE 12/22/2009    Past Surgical History:  Procedure Laterality Date  . A/V FISTULAGRAM N/A 10/25/2018   Procedure: A/V FISTULAGRAM;  Surgeon: Elam Dutch, MD;  Location: [facility] CV LAB;  Service: Cardiovascular;  Laterality: N/A;  . ABDOMINAL HYSTERECTOMY    . APPENDECTOMY    . AV FISTULA PLACEMENT Left 05/21/2018   Procedure: INSERTION OF ARTERIOVENOUS (AV) GORE-TEX GRAFT UPPER ARM;  Surgeon:  Elam Dutch, MD;  Location: [facility] OR;  Service: Vascular;  Laterality: Left;  . AV FISTULA PLACEMENT Left 06/03/2018   Procedure: INSERTION OF ARTERIOVENOUS (AV) GORE-TEX 29mm x 10cm GRAFT INTO LEFT ARM;  Surgeon: Elam Dutch, MD;  Location: [facility];  Service: Vascular;  Laterality: Left;  . IR GENERIC HISTORICAL  10/18/2016   IR RADIOLOGIST EVAL & MGMT 10/18/2016 [name], MD [facility] INTERV RAD  . IR RADIOLOGIST EVAL & MGMT  05/30/2017    . IR RADIOLOGIST EVAL & MGMT  08/28/2018  . IR RADIOLOGIST EVAL & MGMT  10/08/2019  . IR RADIOLOGIST EVAL & MGMT  10/30/2019  . surgery for liver ca    . THROMBECTOMY AND REVISION OF ARTERIOVENTOUS (AV) GORETEX  GRAFT Left 06/03/2018   Procedure: THROMBECTOMY AND REVISION OF ARTERIOVENTOUS (AV) GORETEX  GRAFT ARM;  Surgeon: Elam Dutch, MD;  Location: [facility] OR;  Service: Vascular;  Laterality: Left;     OB History   No obstetric history on file.     Family History  Problem Relation Age of Onset  . Lung cancer Brother   . Breast cancer Other   . Breast cancer Sister   . Gastric cancer Sister   . Lung cancer Sister   . Colon cancer Neg Hx   . Colon polyps Neg Hx   . Rectal cancer Neg Hx   . Stomach cancer Neg Hx   . Esophageal cancer Neg Hx     Social History   Tobacco Use  . Smoking status: Never Smoker  . Smokeless tobacco: Never Used  Vaping Use  . Vaping Use: Never used  Substance Use Topics  . Alcohol use: Not Currently    Alcohol/week: 3.0 - 4.0 standard drinks    Types: 3 - 4 Cans of beer per week    Comment: she quit 02/2015   . Drug use: No    Home Medications Prior to Admission medications   Medication Sig Start Date End Date Taking? Authorizing Provider  ALPRAZolam Duanne Moron) 0.5 MG tablet TAKE 1 TABLET BY MOUTH ONCE DAILY AT BEDTIME 06/23/20   Biagio Borg, MD  amLODipine (NORVASC) 10 MG tablet Take 1 tablet by mouth once daily 06/04/20   Biagio Borg, MD  B Complex-C-Zn-Folic Acid (DIALYVITE 932 WITH ZINC) 0.8 MG TABS Take 1 tablet by mouth daily. 09/29/18   [provider]  Cholecalciferol (VITAMIN D3) 1000 UNITS CAPS Take 1,000 Units by mouth daily.     [provider]  CONSTULOSE 10 GM/15ML solution TAKE 30 ML BY MOUTH ONCE DAILY 05/19/20   Biagio Borg, MD  Darbepoetin Alfa (ARANESP) 100 MCG/0.5ML SOSY injection Inject 0.5 mLs (100 mcg total) into the vein every Thursday with hemodialysis. 07/25/18   Cherene Altes, MD  folic acid  (FOLVITE) 1 MG tablet Take 1 tablet by mouth once daily 01/26/20   Biagio Borg, MD  hydrALAZINE (APRESOLINE) 100 MG tablet TAKE 1 TABLET BY MOUTH THREE TIMES DAILY 01/19/20   Biagio Borg, MD  ketotifen (ZADITOR) 0.025 % ophthalmic solution Place 1 drop into both eyes 2 (two) times daily.    [provider]  lidocaine-prilocaine (EMLA) cream Apply 1 application topically daily as needed (use before dialysis).  10/11/18   [provider]  Methoxy PEG-Epoetin Beta (MIRCERA IJ) Mircera 04/06/20 04/05/21  [provider]  propranolol (INDERAL) 40 MG tablet Take 1 tablet by mouth twice daily 04/06/20   Biagio Borg, MD  Propylene Glycol (  SYSTANE BALANCE) 0.6 % SOLN Place 1 drop into both eyes daily.    [provider]    Allergies    Patient has no known allergies.  Review of Systems   Review of Systems  All other systems reviewed and are negative.   Physical Exam Updated Vital Signs BP (!) 141/56 (BP Location: Right Arm)   Pulse 76   Temp 99.9 F (37.7 C) (Oral)   Resp 17   SpO2 97%   Physical Exam Vitals and nursing note reviewed.  Constitutional:      General: She is not in acute distress.    Appearance: She is well-developed. She is not diaphoretic.     Comments: Patient is a thin, somewhat chronically ill-appearing female in no acute distress.  HENT:     Head: Normocephalic and atraumatic.  Cardiovascular:     Rate and Rhythm: Normal rate and regular rhythm.     Heart sounds: No murmur heard.  No friction rub. No gallop.   Pulmonary:     Effort: Pulmonary effort is normal. No respiratory distress.     Breath sounds: Normal breath sounds. No wheezing.  Abdominal:     General: Bowel sounds are normal. There is no distension.     Palpations: Abdomen is soft.     Tenderness: There is abdominal tenderness in the right upper quadrant, epigastric area and left upper quadrant. There is no right CVA tenderness, left CVA tenderness, guarding or  rebound.  Musculoskeletal:        General: Normal range of motion.     Cervical back: Normal range of motion and neck supple.  Skin:    General: Skin is warm and dry.  Neurological:     Mental Status: She is alert and oriented to person, place, and time.     ED Results / Procedures / Treatments   Labs (all labs ordered are listed, but only abnormal results are displayed) Labs Reviewed  COMPREHENSIVE METABOLIC PANEL - Abnormal; Notable for the following components:      Result Value   Sodium 133 (*)    Chloride 93 (*)    Glucose, Bld 101 (*)    Creatinine, Ser 3.53 (*)    Albumin 2.9 (*)    AST 169 (*)    ALT 116 (*)    Alkaline Phosphatase 147 (*)    Total Bilirubin 5.0 (*)    GFR calc non Af Amer 12 (*)    GFR calc Af Amer 14 (*)    All other components within normal limits  CBC - Abnormal; Notable for the following components:   RBC 3.23 (*)    Hemoglobin 9.4 (*)    HCT 28.8 (*)    RDW 16.5 (*)    Platelets 107 (*)    All other components within normal limits  LIPASE, BLOOD  URINALYSIS, ROUTINE W REFLEX MICROSCOPIC    EKG None  Radiology No results found.  Procedures Procedures (including critical care time)  Medications Ordered in ED Medications  sodium chloride flush (NS) 0.9 % injection 3 mL (has no administration in time range)    ED Course  I have reviewed the triage vital signs and the nursing notes.  Pertinent labs & imaging results that were available during my care of the patient were reviewed by me and considered in my medical decision making (see chart for details).    MDM Rules/Calculators/A&P  Patient with history of hepatocellular carcinoma presenting with complaints of epigastric/right upper quadrant pain.  She was recently diagnosed with gallstones, however her pain became worse over the past few days.  Patient has stable vital signs and is afebrile, but is tender across the upper abdomen.  Her laboratory studies show no elevation of  white count, but there are modest elevations of her LFTs.  Patient was to have an MRCP as an outpatient, so I went ahead and did the study today.  This shows inflammation of the gallbladder and gallstones consistent with acute cholecystitis as well as filling defects and stones within the common bile duct consistent with choledocholithiasis.  The above findings were discussed with Dr. Carlean Purl from gastroenterology.  Patient to be given IV antibiotics and admitted to the hospitalist service under the care of Dr. Cyd Silence.  I have also spoken with Dr. Georgette Dover from general surgery who does not feel as though any acute surgical consultation or intervention is indicated.  CRITICAL CARE Performed by: Veryl Speak Total critical care time: 45 minutes Critical care time was exclusive of separately billable procedures and treating other patients. Critical care was necessary to treat or prevent imminent or life-threatening deterioration. Critical care was time spent personally by me on the following activities: development of treatment plan with patient and/or surrogate as well as nursing, discussions with consultants, evaluation of patient's response to treatment, examination of patient, obtaining history from patient or surrogate, ordering and performing treatments and interventions, ordering and review of laboratory studies, ordering and review of radiographic studies, pulse oximetry and re-evaluation of patient's condition.   Final Clinical Impression(s) / ED Diagnoses Final diagnoses:  None    Rx / DC Orders ED Discharge Orders    None       Veryl Speak, MD 06/26/2020 2100

## 2020-07-03 NOTE — Progress Notes (Signed)
   Patient of Dr. Ardis Hughs being admitted - has cirrhosis, GB mass and now what may be cholecystitis and CBD stone.  Will see in AM, sooner if needed.  Please add to my treatment team list.  Gatha Mayer, MD, [facility] 07/02/2020 8:40 PM

## 2020-07-03 NOTE — ED Triage Notes (Signed)
Daughter stated, she had N/V and stated her stomach is burning up.

## 2020-07-03 NOTE — ED Notes (Signed)
Pt transported to MRI 

## 2020-07-04 ENCOUNTER — Encounter (HOSPITAL_COMMUNITY): Admission: EM | Disposition: E | Payer: Self-pay | Source: Home / Self Care | Attending: Critical Care Medicine

## 2020-07-04 ENCOUNTER — Inpatient Hospital Stay (HOSPITAL_COMMUNITY): Payer: Medicare Other

## 2020-07-04 ENCOUNTER — Inpatient Hospital Stay (HOSPITAL_COMMUNITY): Payer: Medicare Other | Admitting: Anesthesiology

## 2020-07-04 ENCOUNTER — Encounter (HOSPITAL_COMMUNITY): Payer: Self-pay | Admitting: Internal Medicine

## 2020-07-04 DIAGNOSIS — K828 Other specified diseases of gallbladder: Secondary | ICD-10-CM

## 2020-07-04 HISTORY — PX: ENDOSCOPIC RETROGRADE CHOLANGIOPANCREATOGRAPHY (ERCP) WITH PROPOFOL: SHX5810

## 2020-07-04 LAB — CBC WITH DIFFERENTIAL/PLATELET
Abs Immature Granulocytes: 0.04 10*3/uL (ref 0.00–0.07)
Basophils Absolute: 0 10*3/uL (ref 0.0–0.1)
Basophils Relative: 0 %
Eosinophils Absolute: 0 10*3/uL (ref 0.0–0.5)
Eosinophils Relative: 0 %
HCT: 25.6 % — ABNORMAL LOW (ref 36.0–46.0)
Hemoglobin: 8.4 g/dL — ABNORMAL LOW (ref 12.0–15.0)
Immature Granulocytes: 1 %
Lymphocytes Relative: 7 %
Lymphs Abs: 0.5 10*3/uL — ABNORMAL LOW (ref 0.7–4.0)
MCH: 29.9 pg (ref 26.0–34.0)
MCHC: 32.8 g/dL (ref 30.0–36.0)
MCV: 91.1 fL (ref 80.0–100.0)
Monocytes Absolute: 0.9 10*3/uL (ref 0.1–1.0)
Monocytes Relative: 12 %
Neutro Abs: 6 10*3/uL (ref 1.7–7.7)
Neutrophils Relative %: 80 %
Platelets: 99 10*3/uL — ABNORMAL LOW (ref 150–400)
RBC: 2.81 MIL/uL — ABNORMAL LOW (ref 3.87–5.11)
RDW: 16.8 % — ABNORMAL HIGH (ref 11.5–15.5)
WBC: 7.5 10*3/uL (ref 4.0–10.5)
nRBC: 0 % (ref 0.0–0.2)

## 2020-07-04 LAB — COMPREHENSIVE METABOLIC PANEL
ALT: 93 U/L — ABNORMAL HIGH (ref 0–44)
AST: 132 U/L — ABNORMAL HIGH (ref 15–41)
Albumin: 2.5 g/dL — ABNORMAL LOW (ref 3.5–5.0)
Alkaline Phosphatase: 141 U/L — ABNORMAL HIGH (ref 38–126)
Anion gap: 12 (ref 5–15)
BUN: 20 mg/dL (ref 8–23)
CO2: 26 mmol/L (ref 22–32)
Calcium: 8.8 mg/dL — ABNORMAL LOW (ref 8.9–10.3)
Chloride: 91 mmol/L — ABNORMAL LOW (ref 98–111)
Creatinine, Ser: 4.49 mg/dL — ABNORMAL HIGH (ref 0.44–1.00)
GFR calc Af Amer: 10 mL/min — ABNORMAL LOW (ref >60)
GFR calc non Af Amer: 9 mL/min — ABNORMAL LOW (ref >60)
Glucose, Bld: 93 mg/dL (ref 70–99)
Potassium: 3.9 mmol/L (ref 3.5–5.1)
Sodium: 129 mmol/L — ABNORMAL LOW (ref 135–145)
Total Bilirubin: 5.1 mg/dL — ABNORMAL HIGH (ref 0.3–1.2)
Total Protein: 6.7 g/dL (ref 6.5–8.1)

## 2020-07-04 LAB — PROTIME-INR
INR: 1.2 (ref 0.8–1.2)
Prothrombin Time: 14.4 seconds (ref 11.4–15.2)

## 2020-07-04 LAB — MAGNESIUM: Magnesium: 1.8 mg/dL (ref 1.7–2.4)

## 2020-07-04 LAB — MRSA PCR SCREENING: MRSA by PCR: NEGATIVE

## 2020-07-04 LAB — APTT: aPTT: 34 seconds (ref 24–36)

## 2020-07-04 SURGERY — ENDOSCOPIC RETROGRADE CHOLANGIOPANCREATOGRAPHY (ERCP) WITH PROPOFOL
Anesthesia: General

## 2020-07-04 MED ORDER — ONDANSETRON HCL 4 MG/2ML IJ SOLN
INTRAMUSCULAR | Status: DC | PRN
  Administered 2020-07-04: 4 mg via INTRAVENOUS

## 2020-07-04 MED ORDER — ALBUMIN HUMAN 5 % IV SOLN: INTRAVENOUS | Status: DC | PRN

## 2020-07-04 MED ORDER — SODIUM CHLORIDE 0.9 % IV SOLN
INTRAVENOUS | Status: DC | PRN
Start: 2020-07-04 — End: 2020-07-04

## 2020-07-04 MED ORDER — SUCCINYLCHOLINE CHLORIDE 20 MG/ML IJ SOLN
INTRAMUSCULAR | Status: DC | PRN
  Administered 2020-07-04: 120 mg via INTRAVENOUS

## 2020-07-04 MED ORDER — FENTANYL CITRATE (PF) 100 MCG/2ML IJ SOLN
INTRAMUSCULAR | Status: DC | PRN
  Administered 2020-07-04: 50 ug via INTRAVENOUS

## 2020-07-04 MED ORDER — GLUCAGON HCL RDNA (DIAGNOSTIC) 1 MG IJ SOLR
INTRAMUSCULAR | Status: DC | PRN
Start: 2020-07-04 — End: 2020-07-04
  Administered 2020-07-04: .5 mg via INTRAVENOUS

## 2020-07-04 MED ORDER — PROPOFOL 10 MG/ML IV BOLUS
INTRAVENOUS | Status: DC | PRN
  Administered 2020-07-04: 120 mg via INTRAVENOUS

## 2020-07-04 MED ORDER — INDOMETHACIN 50 MG RE SUPP
RECTAL | Status: DC | PRN
  Administered 2020-07-04: 100 mg via RECTAL

## 2020-07-04 MED ORDER — POLYVINYL ALCOHOL 1.4 % OP SOLN
1.0000 [drp] | Freq: Every day | OPHTHALMIC | Status: DC
  Administered 2020-07-04 – 2020-07-13 (×9): 1 [drp] via OPHTHALMIC
  Filled 2020-07-04 (×2): qty 15

## 2020-07-04 MED ORDER — PHENYLEPHRINE HCL (PRESSORS) 10 MG/ML IV SOLN
INTRAVENOUS | Status: DC | PRN
  Administered 2020-07-04: 160 ug via INTRAVENOUS
  Administered 2020-07-04: 80 ug via INTRAVENOUS
  Administered 2020-07-04: 160 ug via INTRAVENOUS

## 2020-07-04 MED ORDER — FENTANYL CITRATE (PF) 250 MCG/5ML IJ SOLN
INTRAMUSCULAR | Status: AC
  Filled 2020-07-04: qty 5

## 2020-07-04 MED ORDER — AMLODIPINE BESYLATE 10 MG PO TABS: 10.0000 mg | ORAL_TABLET | Freq: Every day | ORAL | Status: DC

## 2020-07-04 MED ORDER — GLUCAGON HCL RDNA (DIAGNOSTIC) 1 MG IJ SOLR
INTRAMUSCULAR | Status: AC
  Filled 2020-07-04: qty 1

## 2020-07-04 MED ORDER — EPHEDRINE SULFATE 50 MG/ML IJ SOLN
INTRAMUSCULAR | Status: DC | PRN
  Administered 2020-07-04 (×3): 10 mg via INTRAVENOUS

## 2020-07-04 MED ORDER — INDOMETHACIN 50 MG RE SUPP
RECTAL | Status: AC
  Filled 2020-07-04: qty 2

## 2020-07-04 MED ORDER — LIDOCAINE 2% (20 MG/ML) 5 ML SYRINGE
INTRAMUSCULAR | Status: DC | PRN
  Administered 2020-07-04: 20 mg via INTRAVENOUS

## 2020-07-04 MED ORDER — INDOMETHACIN 50 MG RE SUPP
100.0000 mg | Freq: Once | RECTAL | Status: AC
  Administered 2020-07-04: 100 mg via RECTAL
  Filled 2020-07-04: qty 2

## 2020-07-04 NOTE — Anesthesia Procedure Notes (Signed)
Procedure Name: Intubation Date/Time: 07/02/2020 10:49 AM Performed by: Babs Bertin, CRNA Pre-anesthesia Checklist: Patient identified, Emergency Drugs available, Suction available and Patient being monitored Patient Re-evaluated:Patient Re-evaluated prior to induction Oxygen Delivery Method: Circle System Utilized Preoxygenation: Pre-oxygenation with 100% oxygen Induction Type: IV induction and Rapid sequence Laryngoscope Size: Mac and 3 Grade View: Grade I Tube type: Oral Tube size: 7.0 mm Number of attempts: 1 Airway Equipment and Method: Stylet and Oral airway Placement Confirmation: ETT inserted through vocal cords under direct vision,  positive ETCO2 and breath sounds checked- equal and bilateral Secured at: 21 cm Tube secured with: Tape Dental Injury: Teeth and Oropharynx as per pre-operative assessment

## 2020-07-04 NOTE — Progress Notes (Addendum)
PROGRESS NOTE  LURLINE CAVER QMG:[id number] DOB: 1946/11/18 DOA: 07/09/2020 PCP: Biagio Borg, MD  HPI/Recap of past 27 hours: 74 year old female with past medical history of hypertension, hyperlipidemia, depression, anxiety, hepatocellular carcinoma (dx 02/2015 S/P thermoablation follows with Dr. Burr Medico), ESRD (Tues, Thurs, Sat), alcoholic liver cirrhosis who presents to [facility] emergency department complaints of severe abdominal pain.   Upon evaluation at [facility] emergency department, MRCP was performed revealing evidence of both choledocholithiasis and cholecystitis.  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  Unsuccessful ERCP on 06/24/2020.  Case was also discussed with Dr. Gershon Crane with general surgery who said that general surgery does not need to be involved yet at this time until at least Monday. Continue Intravenous Zosyn as recommended by GI.    07/09/2020: ERCP done this am but unsuccessful.  Continue empiric IV antibiotics.  Continue as needed pain control.  Assessment/Plan: Principal Problem:   Choledocholithiasis with acute cholecystitis Active Problems:   Essential hypertension   Alcoholic cirrhosis of liver without ascites (HCC)   Hepatocellular carcinoma (HCC)   ESRD (end stage renal disease) ([place])   Anemia due to end stage renal disease (HCC)   Gallbladder mass  Choledocholithiasis with acute cholecystitis  Patient presenting with severe abdominal pain with initial work-up revealing multiple deranged hepatic function indices including markedly elevated alkaline phosphatase, AST and ALT as well as total bilirubin of 5.0.  Patient is found to have choledocholithiasis as well as evidence of acute cholecystitis on MRCP  Acute cholecystitis is likely secondary to concurrent gallbladder mass identified on MRCP.  Intravenous Zosyn has been  initiated which will be continued.  Continue to follow blood cultures  Unsuccessful ERCP on 06/20/2020.  Case was also discussed with Dr. Gershon Crane with general surgery who said that general surgery does not need to be involved yet at this time until at least Monday. Continue Intravenous Zosyn as recommended by GI.    PRN opiate-based analgesics for substantial associated pain  Gallbladder mass, rule out neoplasm Please see assessment and plan above.  Hyponatremia in the setting of ESRD Sodium 129 Electrolytes and volume status addressed with hemodialysis  ESRD on HD TTS Nephrology consulted for hemodialysis   Essential hypertension BP at goal Continue home regimen of antihypertensives    Anemia of chronic disease due to end stage renal disease on hemodialysis  No clinical evidence of bleeding  Hemoglobin 8.4  Continue to monitor H&H    Alcoholic cirrhosis of liver without ascites Continue supportive care Avoid hepatotoxic agents    Hepatocellular carcinoma   Patient follows with Dr. Burr Medico as an outpatient.   Code Status:  Full code Family Communication: Daughter is at bedside.  Status is: Inpatient  Remains inpatient appropriate because:Ongoing diagnostic testing needed not appropriate for outpatient work up, IV treatments appropriate due to intensity of illness or inability to take PO and Inpatient level of care appropriate due to severity of illness   Dispo: The patient is from: Home  Anticipated d/c is to: Home  Anticipated d/c date is: 07/03/2020  Patient currently is not medically stable to d/c due to ongoing work-up for gallbladder lesion.          Objective: Vitals:   06/26/2020 1506 07/16/2020 2317 07/11/2020 2354 06/28/2020 0514  BP: (!) 141/56 (!) 144/58 (!) 141/72 (!) 136/52  Pulse: 76 67 71 67  Resp: 17 19 16 17   Temp: 99.9  F (37.7 C)  99.4 F (37.4 C) 98.5 F (36.9 C)  TempSrc: Oral  Oral Oral    SpO2: 97% 96% 90% 96%    Intake/Output Summary (Last 24 hours) at 07/01/2020 0802 Last data filed at 06/25/2020 2328 Gross per 24 hour  Intake 50 ml  Output --  Net 50 ml   There were no vitals filed for this visit.  Exam:  . General: 74 y.o. year-old female well developed well nourished in no acute distress.  Alert and interactive. . Cardiovascular: Regular rate and rhythm with no rubs or gallops.  No thyromegaly or JVD noted.   Marland Kitchen Respiratory: Clear to auscultation with no wheezes or rales. Good inspiratory effort. . Abdomen: Soft nondistended with normal bowel sounds. . Musculoskeletal: No lower extremity edema. Marland Kitchen Psychiatry: Mood is appropriate for condition and setting   Data Reviewed: CBC: Recent Labs  Lab 07/11/2020 1506 06/29/2020 0026  WBC 9.3 7.5  NEUTROABS  --  6.0  HGB 9.4* 8.4*  HCT 28.8* 25.6*  MCV 89.2 91.1  PLT 107* 99*   Basic Metabolic Panel: Recent Labs  Lab 06/23/2020 1506 06/21/2020 0026  NA 133* 129*  K 3.8 3.9  CL 93* 91*  CO2 27 26  GLUCOSE 101* 93  BUN 12 20  CREATININE 3.53* 4.49*  CALCIUM 8.9 8.8*  MG  --  1.8   GFR: CrCl cannot be calculated (Unknown ideal weight.). Liver Function Tests: Recent Labs  Lab 07/07/2020 1506 06/27/2020 0026  AST 169* 132*  ALT 116* 93*  ALKPHOS 147* 141*  BILITOT 5.0* 5.1*  PROT 7.3 6.7  ALBUMIN 2.9* 2.5*   Recent Labs  Lab 07/02/2020 1506  LIPASE 34   No results for input(s): AMMONIA in the last 168 hours. Coagulation Profile: Recent Labs  Lab 06/18/2020 0026  INR 1.2   Cardiac Enzymes: No results for input(s): CKTOTAL, CKMB, CKMBINDEX, TROPONINI in the last 168 hours. BNP (last 3 results) No results for input(s): PROBNP in the last 8760 hours. HbA1C: No results for input(s): HGBA1C in the last 72 hours. CBG: No results for input(s): GLUCAP in the last 168 hours. Lipid Profile: No results for input(s): CHOL, HDL, LDLCALC, TRIG, CHOLHDL, LDLDIRECT in the last 72 hours. Thyroid Function  Tests: No results for input(s): TSH, T4TOTAL, FREET4, T3FREE, THYROIDAB in the last 72 hours. Anemia Panel: No results for input(s): VITAMINB12, FOLATE, FERRITIN, TIBC, IRON, RETICCTPCT in the last 72 hours. Urine analysis:    Component Value Date/Time   COLORURINE AMBER (A) 07/14/2018 0104   APPEARANCEUR HAZY (A) 07/14/2018 0104   LABSPEC 1.019 07/14/2018 0104   PHURINE 5.0 07/14/2018 0104   GLUCOSEU NEGATIVE 07/14/2018 0104   GLUCOSEU NEGATIVE 09/04/2017 0943   HGBUR NEGATIVE 07/14/2018 0104   BILIRUBINUR NEGATIVE 07/14/2018 0104   KETONESUR NEGATIVE 07/14/2018 0104   PROTEINUR >=300 (A) 07/14/2018 0104   UROBILINOGEN 0.2 09/04/2017 0943   NITRITE NEGATIVE 07/14/2018 0104   LEUKOCYTESUR NEGATIVE 07/14/2018 0104   Sepsis Labs: @LABRCNTIP (procalcitonin:4,lacticidven:4)  ) Recent Results (from the past 240 hour(s))  SARS Coronavirus 2 by RT PCR (hospital order, performed in [place] hospital lab) Nasopharyngeal Nasopharyngeal Swab     Status: None   Collection Time: 07/12/2020 10:14 PM   Specimen: Nasopharyngeal Swab  Result Value Ref Range Status   SARS Coronavirus 2 NEGATIVE NEGATIVE Final    Comment: (NOTE) SARS-CoV-2 target nucleic acids are NOT DETECTED.  The SARS-CoV-2 RNA is generally detectable in upper and lower respiratory specimens during the acute phase  of infection. The lowest concentration of SARS-CoV-2 viral copies this assay can detect is 250 copies / mL. A negative result does not preclude SARS-CoV-2 infection and should not be used as the sole basis for treatment or other patient management decisions.  A negative result may occur with improper specimen collection / handling, submission of specimen other than nasopharyngeal swab, presence of viral mutation(s) within the areas targeted by this assay, and inadequate number of viral copies (<250 copies / mL). A negative result must be combined with clinical observations, patient history, and epidemiological  information.  Fact Sheet for Patients:   StrictlyIdeas.no  Fact Sheet for Healthcare Providers: BankingDealers.co.za  This test is not yet approved or  cleared by the [place] FDA and has been authorized for detection and/or diagnosis of SARS-CoV-2 by FDA under an Emergency Use Authorization (EUA).  This EUA will remain in effect (meaning this test can be used) for the duration of the COVID-19 declaration under Section 564(b)(1) of the Act, 21 U.S.C. section 360bbb-3(b)(1), unless the authorization is terminated or revoked sooner.  Performed at [facility], [place] [address], [place], [place] [address]       Studies: MR ABDOMEN MRCP WO CONTRAST  Result Date: 07/11/2020 CLINICAL DATA:  Cholelithiasis. Nausea and vomiting. Abdominal pain. EXAM: MRI ABDOMEN WITHOUT CONTRAST  (INCLUDING MRCP) TECHNIQUE: Multiplanar multisequence MR imaging of the abdomen was performed. Heavily T2-weighted images of the biliary and pancreatic ducts were obtained, and three-dimensional MRCP images were rendered by post processing. COMPARISON:  Ultrasound of 05/31/2020.  Prior MRI of 09/24/2019. FINDINGS: Mild motion degradation. Exam is also limited by lack of intravenous contrast. Lower chest: Mild cardiomegaly.  No pericardial or pleural effusion. Hepatobiliary: Mild cirrhosis. Iron deposition within the liver. Segment 8 ablation defect is subtly apparent on 13/10. Suboptimally evaluated, without gross local recurrence. No new liver lesion. The gallbladder is distended, including 11.5 cm on 17/7. Mild pericystic edema is identified. Soft tissue density within the gallbladder lumen is similar in size to on the prior exam, including at 2.9 cm on 23/6. Mild intrahepatic biliary duct dilatation. Upper normal to mild common duct dilatation, including at 9 mm on 20/7. Distal common duct stone including at 1.3 cm on 21/7. Pancreas: Grossly normal for age, without  duct dilatation or acute inflammation. Spleen:  Iron deposition within. Adrenals/Urinary Tract: Normal adrenal glands. Moderate renal atrophy bilaterally. Complex cystic lesion or lesions within the upper pole left kidney are grossly similar. Example at 4.0 x 2.9 cm on 16/6 versus 3.9 x 2.5 cm on the prior exam (when remeasured). No hydronephrosis. Stomach/Bowel: Normal stomach and bowel loops. Vascular/Lymphatic: Normal aortic caliber. No abdominal adenopathy. Other: Trace ascites adjacent to the left dominant small bowel loops, including on 25/10. Musculoskeletal: No acute osseous abnormality. IMPRESSION: 1. Decreased sensitivity and specificity exam due to technique related factors, as described above. 2. Choledocholithiasis, with a 1.3 cm distal common duct stone. 3. Gallbladder distension with mild pericholecystic edema. Findings are suspicious for acute cholecystitis. 4. Soft tissue signal within the gallbladder lumen is similar to on the prior exam. 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 5. Cirrhosis and segment 8 ablation defect, suboptimally evaluated without gross recurrent or metachronous hepatocellular carcinoma. 6. Hemosiderosis. 7. Trace abdominal ascites. 8. Similar upper pole left renal complex cystic lesion, incompletely characterized. Findings were communicated with the clinical service subsequent to a preliminary interpretation on the evening of 06/22/2020.  Electronically Signed   By: Abigail Miyamoto M.D.   On: 06/30/2020 04:23    Scheduled Meds: . ALPRAZolam  0.5 mg Oral QHS  . amLODipine  10 mg Oral Daily  . heparin  5,000 Units Subcutaneous Q8H  . hydrALAZINE  100 mg Oral TID  . ketotifen  1 drop Both Eyes BID  . lactulose  20 g Oral Daily  . multivitamin  1 tablet Oral QHS  . polyvinyl alcohol  1 drop Both Eyes Daily  . propranolol  40 mg Oral BID    Continuous  Infusions: . piperacillin-tazobactam (ZOSYN)  IV 2.25 g (06/23/2020 0529)     LOS: 1 day     Kayleen Memos, MD [facility] Pager [phone]  If 7PM-7AM, please contact night-coverage [email or web address] Password [email or web address] 06/29/2020, 8:02 AM

## 2020-07-04 NOTE — Consult Note (Addendum)
[facility] Consult: 9:23 AM 07/05/2020  LOS: 1 day    Referring Provider: Dr Irene Pap  Primary Care Physician:  Biagio Borg, MD Primary Gastroenterologist:  Roosevelt Locks NP and Owens Loffler MD    Reason for Consultation: Choledocholithiasis.   HPI: Sarah Harmon is a 74 y.o. female.  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  Stopped drinking 03/2015..  Stage I hepatocellular carcinoma, s/p thermal ablation 04/2015, followed by Dr Burr Medico.  AFP in 02/2020 7.5.  ESRD, TTS HD.  Thrombocytopenia, pancytopenia..  Blood loss anemia, blood transfusions in 2019.  Hyponatremia.  12/2015 EGD, variceal screening.  Erosive gastritis, biopsied.  No evidence of portal gastropathy.  Path: chronic gastritis with focal intestinal metaplasia, no H. pylori 12/2015 colonoscopy, average risk screening.  Left sided diverticulosis.  Repeat screening study 10 years. 04/21/20 EGD.  For variceal screening in cirrhotic.  Moderate gastritis, biopsied extensively.  No signs of portal hypertension.  However, pathology revealed antral mucosa with hyperemia and focal intestinal metaplasia, ddx includes portal gastropathy.  Stains negative for H pylori.  Patient underwent routine surveillance Ultrasound abdomen: on 05/31/2020 revealing GB distended with sludge.  Apparent 3 cm soft tissue lesion w/in GB.  No substantial pericholecystic fluid.  CBD 4 mm.  Cirrhotic liver without mass. Seen at [facility] 7/14, Dr. Barry Dienes, and cholecystectomy planned.  Date not confirmed.  That morning of 7/14 she had nausea and a few episodes of bious vomiting about 20 m after AM meal of grits.  Felt better after that.  Some persistent anorexia over the next few days. Saturday, yesterday after returning home from hemodialysis around noon, onset severe, 10/10 diffuse abdominal  pain radiating to the right back.  No nausea or vomiting.  Pain lasted some hours but was relieved with analgesic administered at the hospital.  It has not recurred. Generally patient does not have issues with nausea, vomiting or abdominal pain.  Her appetite is variable.  No dysphagia.  Stable weight.   T bili 5 >> 5.1.Marland Kitchen  Alk phos 147 >> 141.  AST/ALT 169/116 >> 132/93.  Lipase 34. Na 129.  BUN/creatinine 20/4.4. Hgb 8.4.  MCV 91.  Platelets 99.  INR 1.2 MRI/MRCP: Decreased sensitivity due to technique.  1.3 cm distal CBD stone.  Distended GB with mild pericholecystic edema suspicious for acute cholecystitis.  Soft tissue signal in GB lumen could represent GB sludge but Doppler signal suggests neoplasm.  Cirrhosis with segment 8 ablation defect suboptimally evaluated but no gross recurrent or metachronous hepatocellular carcinoma.  Hemosiderosis.  Trace abdominal ascites.  Complex left renal cyst.  Social history Lives with her son.  Has not drunk for quite a number of years.  Not a smoker.  Family history pertinent for lung and breast cancer in siblings.  A sister had gastric cancer.  Past Medical History:  Diagnosis Date  . ALCOHOL ABUSE 07/13/2010   Quit drinking 2016  . Depression with anxiety 12/22/2009  . 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 DJD (degenerative joint disease), cervical    patient denies on preop  of 05/10/15   . ESRD (end stage renal disease) on dialysis ([place])    HD TTS  . GALLSTONES 12/22/2009  . Headache    occasional   . Hepatocellular carcinoma ([place]) 2016   04/2015 RFA.  Marland Kitchen HYPERLIPIDEMIA 12/22/2009  . HYPERTENSION 12/22/2009  . HYPONATREMIA 12/22/2009  . 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 OSTEOPENIA 12/22/2009  . Pancytopenia 07/13/2010  . RENAL CYST, LEFT 12/22/2009  . VITAMIN D DEFICIENCY 12/23/2010  . WEIGHT LOSS 12/23/2010    Past Surgical History:  Procedure Laterality Date  . A/V FISTULAGRAM N/A 10/25/2018   Procedure: A/V FISTULAGRAM;  Surgeon: Elam Dutch, MD;  Location: [facility];  Service: Cardiovascular;  Laterality: N/A;  . ABDOMINAL HYSTERECTOMY    . APPENDECTOMY    . AV FISTULA PLACEMENT Left 05/21/2018   Procedure: INSERTION OF ARTERIOVENOUS (AV) GORE-TEX GRAFT UPPER ARM;  Surgeon: Elam Dutch, MD;  Location: [facility];  Service: Vascular;  Laterality: Left;  . AV FISTULA PLACEMENT Left 06/03/2018   Procedure: INSERTION OF ARTERIOVENOUS (AV) GORE-TEX 15m x 10cm GRAFT INTO LEFT ARM;  Surgeon: FElam Dutch MD;  [facility] [facility]  Service: Vascular;  Laterality: Left;  . IR GENERIC HISTORICAL  10/18/2016   IR RADIOLOGIST EVAL & MGMT 10/18/2016 GAletta Edouard MD [facility] INTERV RAD  . IR RADIOLOGIST EVAL & MGMT  05/30/2017  . IR RADIOLOGIST EVAL & MGMT  08/28/2018  . IR RADIOLOGIST EVAL & MGMT  10/08/2019  . IR RADIOLOGIST EVAL & MGMT  10/30/2019  . surgery for liver ca    . THROMBECTOMY AND REVISION OF ARTERIOVENTOUS (AV) GORETEX  GRAFT Left 06/03/2018   Procedure: THROMBECTOMY AND REVISION OF ARTERIOVENTOUS (AV) GORETEX  GRAFT ARM;  Surgeon: FElam Dutch MD;  [facility]  Service: Vascular;  Laterality: Left;    Prior to Admission medications   Medication Sig Start Date End Date Taking? Authorizing Provider  Acetaminophen (TYLENOL PO) Take 1 tablet by mouth 3 (three) times a week.   Yes [provider]  ALPRAZolam (XANAX) 0.5 MG tablet TAKE 1 TABLET BY MOUTH ONCE DAILY AT BEDTIME Patient taking differently: Take 0.5 mg by mouth at bedtime.  06/23/20  Yes JBiagio Borg MD  amLODipine (NORVASC) 10 MG tablet Take 1 tablet by mouth once daily Patient taking differently: Take 10 mg by mouth daily.  06/04/20  Yes JBiagio Borg MD  B Complex-C-Zn-Folic Acid (DIALYVITE 8563WITH ZINC) 0.8 MG TABS Take 1 tablet by mouth daily. 09/29/18  Yes [provider]  CONSTULOSE 10 GM/15ML solution TAKE 30 ML BY MOUTH ONCE DAILY Patient taking differently: Take 20 g by mouth daily.  05/19/20  Yes JBiagio Borg MD   folic acid (FOLVITE) 1 MG tablet Take 1 tablet by mouth once daily Patient taking differently: Take 1 mg by mouth daily.  01/26/20  Yes JBiagio Borg MD  hydrALAZINE (APRESOLINE) 100 MG tablet TAKE 1 TABLET BY MOUTH THREE TIMES DAILY Patient taking differently: Take 100 mg by mouth 3 (three) times daily.  01/19/20  Yes JBiagio Borg MD  ketotifen (ZADITOR) 0.025 % ophthalmic solution Place 1 drop into both eyes 2 (two) times daily.   Yes [provider]  lidocaine-prilocaine (EMLA) cream Apply 1 application topically daily as needed (use before dialysis).  10/11/18  Yes [provider]  propranolol (INDERAL) 40 MG tablet Take 1 tablet by mouth twice daily Patient taking differently: Take 40 mg by mouth 2 (two) times daily.  04/06/20  Yes Biagio Borg, MD  Propylene Glycol (SYSTANE BALANCE) 0.6 % SOLN Place 1 drop into both eyes daily.   Yes [provider]  Darbepoetin Alfa (ARANESP) 100 MCG/0.5ML SOSY injection Inject 0.5 mLs (100 mcg total) into the vein every Thursday with hemodialysis. 07/25/18   Cherene Altes, MD  Methoxy PEG-Epoetin Beta (MIRCERA IJ) Mircera 04/06/20 04/05/21  [provider]    Scheduled Meds: . ALPRAZolam  0.5 mg Oral QHS  . amLODipine  10 mg Oral Daily  . heparin  5,000 Units Subcutaneous Q8H  . hydrALAZINE  100 mg Oral TID  . ketotifen  1 drop Both Eyes BID  . lactulose  20 g Oral Daily  . multivitamin  1 tablet Oral QHS  . polyvinyl alcohol  1 drop Both Eyes Daily  . propranolol  40 mg Oral BID   Infusions: . piperacillin-tazobactam (ZOSYN)  IV 2.25 g (07/10/2020 0529)   PRN Meds: acetaminophen **OR** acetaminophen, morphine injection **OR** morphine injection, ondansetron **OR** ondansetron (ZOFRAN) IV, polyethylene glycol   Allergies as of 07/17/2020  . (No Known Allergies)    Family History  Problem Relation Age of Onset  . Lung cancer Brother   . Breast cancer Other   . Breast cancer Sister   . Gastric cancer  Sister   . Lung cancer Sister   . Colon cancer Neg Hx   . Colon polyps Neg Hx   . Rectal cancer Neg Hx   . Stomach cancer Neg Hx   . Esophageal cancer Neg Hx     Social History   Socioeconomic History  . Marital status: Widowed    Spouse name: Not on file  . Number of children: 4  . Years of education: Not on file  . Highest education level: Not on file  Occupational History  . Occupation: retired  Tobacco Use  . Smoking status: Never Smoker  . Smokeless tobacco: Never Used  Vaping Use  . Vaping Use: Never used  Substance and Sexual Activity  . Alcohol use: Not Currently    Alcohol/week: 3.0 - 4.0 standard drinks    Types: 3 - 4 Cans of beer per week    Comment: she quit 02/2015   . Drug use: No  . Sexual activity: Never  Other Topics Concern  . Not on file  Social History Narrative   Widowed, grown son lives with her   Retired from Manufacturing engineer   Has total of #5 children   Enjoys watching TV and planting flowers     REVIEW OF SYSTEMS: Constitutional: Does not get a lot of exercise but no difficulty ambulating within her home or walking from parking lot to the dialysis unit. ENT:  No nose bleeds Pulm: No shortness of breath or cough CV:  No palpitations, no LE edema.  No angina GU:  No hematuria, no frequency GI: See HPI Heme: Denies unusual or excessive bleeding or bruising. Transfusions: See HPI.No transfusions since 2019. Neuro:  No headaches, no peripheral tingling or numbness Derm:  No itching, no rash or sores.  Endocrine:  No sweats or chills.  No polyuria or dysuria Immunization: Has received Covid vaccinations. Travel:  None beyond local counties in last few months.    PHYSICAL EXAM: Vital signs in last 24 hours: Vitals:   07/12/2020 2354 06/20/2020 0514  BP: (!) 141/72 (!) 136/52  Pulse: 71 67  Resp: 16 17  Temp: 99.4 F (37.4 C) 98.5 F (36.9 C)  SpO2: 90%  96%   Wt Readings from Last 3 Encounters:  04/21/20 53.5 kg  04/05/20  53.8 kg  02/18/20 53.4 kg    General: Pleasant, somewhat chronically ill but not acutely ill or toxic appearing elderly female Head: No facial asymmetry or swelling.  No signs of head trauma. Eyes: No scleral icterus, no conjunctival pallor. Ears: Not hard of hearing Nose: No congestion or discharge Mouth: Edentulous.  Mucosa is moist, pink, clear. Neck: No JVD, no masses, no thyromegaly. Lungs: Diminished breath sounds bilaterally but no labored breathing, no cough, no adventitious sounds. Heart: RRR.  No MRG.  S1, S2 present Abdomen: Soft, diffusely tender without guarding or rebound.  No masses, HSM, bruits, hernias.    Musc/Skeltl: No joint redness, swelling or gross deformity. Extremities: No CCE. Neurologic: Fully alert and oriented.  Good historian.  Moves all 4 limbs without tremor.  No gross weakness or deficits. Skin: No jaundice, no rash, no sores. Nodes: No cervical adenopathy Psych: Cooperative, calm, pleasant.  Fluid speech.  Intake/Output from previous day:  LAB RESULTS: Recent Labs    07/12/2020 1506 06/28/2020 0026  WBC 9.3 7.5  HGB 9.4* 8.4*  HCT 28.8* 25.6*  PLT 107* 99*   BMET Lab Results  Component Value Date   NA 129 (L) 07/10/2020   NA 133 (L) 06/20/2020   NA 136 04/05/2020   K 3.9 06/27/2020   K 3.8 06/27/2020   K 4.1 04/05/2020   CL 91 (L) 06/27/2020   CL 93 (L) 06/30/2020   CL 92 (L) 04/05/2020   CO2 26 07/12/2020   CO2 27 06/23/2020   CO2 32 04/05/2020   GLUCOSE 93 06/25/2020   GLUCOSE 101 (H) 06/22/2020   GLUCOSE 99 04/05/2020   BUN 20 07/11/2020   BUN 12 07/16/2020   BUN 34 (H) 04/05/2020   CREATININE 4.49 (H) 07/08/2020   CREATININE 3.53 (H) 07/02/2020   CREATININE 7.20 (HH) 04/05/2020   CALCIUM 8.8 (L) 06/30/2020   CALCIUM 8.9 07/07/2020   CALCIUM 9.7 04/05/2020   LFT Recent Labs    07/08/2020 1506 07/15/2020 0026  PROT 7.3 6.7  ALBUMIN 2.9* 2.5*  AST 169* 132*  ALT 116* 93*  ALKPHOS 147* 141*  BILITOT 5.0* 5.1*    PT/INR Lab Results  Component Value Date   INR 1.2 06/27/2020   INR 1.0 04/05/2020   INR 0.98 07/14/2018   Lipase     Component Value Date/Time   LIPASE 34 06/25/2020 1506      RADIOLOGY STUDIES: images viewed CEG MR ABDOMEN MRCP WO CONTRAST  Result Date: 07/14/2020 CLINICAL DATA:  Cholelithiasis. Nausea and vomiting. Abdominal pain. EXAM: MRI ABDOMEN WITHOUT CONTRAST  (INCLUDING MRCP) TECHNIQUE: Multiplanar multisequence MR imaging of the abdomen was performed. Heavily T2-weighted images of the biliary and pancreatic ducts were obtained, and three-dimensional MRCP images were rendered by post processing. COMPARISON:  Ultrasound of 05/31/2020.  Prior MRI of 09/24/2019. FINDINGS: Mild motion degradation. Exam is also limited by lack of intravenous contrast. Lower chest: Mild cardiomegaly.  No pericardial or pleural effusion. Hepatobiliary: Mild cirrhosis. Iron deposition within the liver. Segment 8 ablation defect is subtly apparent on 13/10. Suboptimally evaluated, without gross local recurrence. No new liver lesion. The gallbladder is distended, including 11.5 cm on 17/7. Mild pericystic edema is identified. Soft tissue density within the gallbladder lumen is similar in size to on the prior exam, including at 2.9 cm on 23/6. Mild intrahepatic biliary duct dilatation. Upper normal to mild common duct dilatation, including at 9  mm on 20/7. Distal common duct stone including at 1.3 cm on 21/7. Pancreas: Grossly normal for age, without duct dilatation or acute inflammation. Spleen:  Iron deposition within. Adrenals/Urinary Tract: Normal adrenal glands. Moderate renal atrophy bilaterally. Complex cystic lesion or lesions within the upper pole left kidney are grossly similar. Example at 4.0 x 2.9 cm on 16/6 versus 3.9 x 2.5 cm on the prior exam (when remeasured). No hydronephrosis. Stomach/Bowel: Normal stomach and bowel loops. Vascular/Lymphatic: Normal aortic caliber. No abdominal adenopathy.  Other: Trace ascites adjacent to the left dominant small bowel loops, including on 25/10. Musculoskeletal: No acute osseous abnormality. IMPRESSION: 1. Decreased sensitivity and specificity exam due to technique related factors, as described above. 2. Choledocholithiasis, with a 1.3 cm distal common duct stone. 3. Gallbladder distension with mild pericholecystic edema. Findings are suspicious for acute cholecystitis. 4. Soft tissue signal within the gallbladder lumen is similar to on the prior exam. 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 5. Cirrhosis and segment 8 ablation defect, suboptimally evaluated without gross recurrent or metachronous hepatocellular carcinoma. 6. Hemosiderosis. 7. Trace abdominal ascites. 8. Similar upper pole left renal complex cystic lesion, incompletely characterized. Findings were communicated with the clinical service subsequent to a preliminary interpretation on the evening of 06/19/2020. Electronically Signed   By: Abigail Miyamoto M.D.   On: 06/20/2020 04:23      IMPRESSION:   *    Choledocholithiasis  *    Possible acute cholecystitis.  Soft tissue lesion in gallbladder, rule out neoplasm.  *     Cirrhosis of the liver, compensated. S/p RFA hepatocellular carcinoma 2016  *    Noncritical thrombocytopenia.  *     Normocytic anemia.  [place]inf[place] Rast at dialysis  *    History gastritis, not grossly consistent with portal gastropathy but pathology from earlier this year suggests portal gastropathy.  Patient is not on PPI or H2 blocker according to her med list.  PLAN:     *    ERCP today, continue n.p.o. to allow for procedure. Zosyn is in place so does not need additional 3 ERCP antibiotic   Azucena Freed  07/15/2020, 9:23 AM Phone [place] Attending   I have taken an interval history, reviewed the chart and examined the  patient. I agree with the Advanced Practitioner's note, impression and recommendations.   Choledocholithiasis Cholecystitis - acute Gallbladder mass  ERCP this AM - remove stones The risks and benefits as well as alternatives of endoscopic procedure(s) have been discussed and reviewed. All questions answered. The patient agrees to proceed.   Gatha Mayer, MD, [facility] 06/17/2020 9:56 AM

## 2020-07-04 NOTE — Anesthesia Postprocedure Evaluation (Signed)
Anesthesia Post Note  Patient: Sarah Harmon  Procedure(s) Performed: ENDOSCOPIC RETROGRADE CHOLANGIOPANCREATOGRAPHY (ERCP) WITH PROPOFOL (N/A )     Patient location during evaluation: Endoscopy Anesthesia Type: General Level of consciousness: awake and alert Pain management: pain level controlled Vital Signs Assessment: post-procedure vital signs reviewed and stable Respiratory status: spontaneous breathing, nonlabored ventilation, respiratory function stable and patient connected to nasal cannula oxygen Cardiovascular status: blood pressure returned to baseline and stable Postop Assessment: no apparent nausea or vomiting Anesthetic complications: no   No complications documented.  Last Vitals:  Vitals:   07/14/2020 1228 07/07/2020 1259  BP: (!) 139/47 135/62  Pulse: 66 66  Resp: 18 18  Temp:  36.6 C  SpO2: 98% 98%    Last Pain:  Vitals:   06/20/2020 1259  TempSrc: Oral  PainSc:                  [name]

## 2020-07-04 NOTE — Anesthesia Preprocedure Evaluation (Addendum)
Anesthesia Evaluation  Patient identified by MRN, date of birth, ID band Patient awake    Reviewed: Allergy & Precautions, NPO status , Patient's Chart, lab work & pertinent test results  Airway Mallampati: II  TM Distance: >3 FB     Dental  (+) Edentulous Upper, Edentulous Lower   Pulmonary    breath sounds clear to auscultation       Cardiovascular hypertension,  Rhythm:Regular Rate:Normal + Systolic murmurs    Neuro/Psych    GI/Hepatic   Endo/Other    Renal/GU      Musculoskeletal   Abdominal   Peds  Hematology   Anesthesia Other Findings   Reproductive/Obstetrics                             Anesthesia Physical Anesthesia Plan  ASA: III  Anesthesia Plan: General   Post-op Pain Management:    Induction: Intravenous  PONV Risk Score and Plan: Ondansetron and Dexamethasone  Airway Management Planned: Oral ETT  Additional Equipment:   Intra-op Plan:   Post-operative Plan: Extubation in OR  Informed Consent: I have reviewed the patients History and Physical, chart, labs and discussed the procedure including the risks, benefits and alternatives for the proposed anesthesia with the patient or authorized representative who has indicated his/her understanding and acceptance.       Plan Discussed with: CRNA and Anesthesiologist  Anesthesia Plan Comments:         Anesthesia Quick Evaluation

## 2020-07-04 NOTE — Op Note (Signed)
[facility] Patient Name: Sarah Harmon Procedure Date : 06/19/2020 MRN: [id number] Attending MD: Gatha Mayer , MD Date of Birth: 1946-03-01 CSN: [id number] Age: 74 Admit Type: Inpatient Procedure:                ERCP Indications:              Bile duct stone(s), For therapy of bile duct                            stone(s) Providers:                Gatha Mayer, MD, Benetta Spar RN, RN, Laverda Sorenson, Technician, Manuela Schwartz, CRNA Referring MD:              Medicines:                General Anesthesia, Indomethacin 100 mg PR, On Zosyn Complications:            No immediate complications. Estimated Blood Loss:     Estimated blood loss: none. Procedure:                Pre-Anesthesia Assessment:                           - Prior to the procedure, a History and Physical                            was performed, and patient medications and                            allergies were reviewed. The patient's tolerance of                            previous anesthesia was also reviewed. The risks                            and benefits of the procedure and the sedation                            options and risks were discussed with the patient.                            All questions were answered, and informed consent                            was obtained. Prior Anticoagulants: The patient has                            taken no previous anticoagulant or antiplatelet                            agents. ASA Grade Assessment: III - A patient with  severe systemic disease. After reviewing the risks                            and benefits, the patient was deemed in                            satisfactory condition to undergo the procedure.                           After obtaining informed consent, the scope was                            passed under direct vision. Throughout the                             procedure, the patient's blood pressure, pulse, and                            oxygen saturations were monitored continuously. The                            [id number]-Q180V ([id number]) Olympus Duodensocope was                            introduced through the mouth, and used to inject                            contrast into without successful cannulation. The                            ERCP was performed with difficulty due to                            challenging cannulation because of abnormal                            anatomy. The patient tolerated the procedure well. Scope In: Scope Out: Findings:      The scout film was normal. Esophagus not seen well.      Stomach grosslky normal.      Duodenum with iron deposition. Difficult to locate papilla and the scope       was in an atypical position - straighter and shorther than normal when I       found it. With manipulation of folds using sphincrterotome could see the       papilla but no spontaneous bile drainage and no papillary orifice seen.       Difficult top position underneath but did manage eventually. I probed       with sphincterotome and wire and seated in an area and could pass the       wire some but think it was submucosal and never in a duct. At times       there was ? of faint green bile seen trickling. Changing to smaller       sphincterotome and wire did not work either so aborted. Impression:               -  Attempts at a cholangiogram failed.                           - No papillary orifice seen and distorted anatomy -                            see below Recommendation:           - Return patient to hospital ward for ongoing care.                           - Continue Abx                           Consult surgery as planned                           We will regroup re: endoscopic removal of CBD                            stones, will have colleagues review though not sure                            our advanced  interventional endoscopist will be                            available to do a procedure.                           I think the very dilated gallbladder and cystic                            duct are distorting the anatomy from what I see on                            MRCP and that is part of the problem as well as                            what appeears to be a very distal stone.                           - I spoke to daughter about this also Procedure Code(s):        --- Professional ---                           [id number], 53, Endoscopic retrograde                            cholangiopancreatography (ERCP); diagnostic,                            including collection of specimen(s) by brushing or                            washing, when performed (separate procedure) Diagnosis  Code(s):        --- Professional ---                           K80.50, Calculus of bile duct without cholangitis                            or cholecystitis without obstruction CPT copyright 2019 American Medical Association. All rights reserved. The codes documented in this report are preliminary and upon coder review may  be revised to meet current compliance requirements. Gatha Mayer, MD 07/14/2020 12:18:57 PM This report has been signed electronically. Number of Addenda: 0

## 2020-07-04 NOTE — Transfer of Care (Signed)
Immediate Anesthesia Transfer of Care Note  Patient: Sarah Harmon  Procedure(s) Performed: ENDOSCOPIC RETROGRADE CHOLANGIOPANCREATOGRAPHY (ERCP) WITH PROPOFOL (N/A )  Patient Location: Endoscopy Unit  Anesthesia Type:General  Level of Consciousness: awake, alert  and oriented  Airway & Oxygen Therapy: Patient Spontanous Breathing and Patient connected to nasal cannula oxygen  Post-op Assessment: Report given to RN and Post -op Vital signs reviewed and stable  Post vital signs: Reviewed and stable  Last Vitals:  Vitals Value Taken Time  BP 131/38 07/02/2020 1208  Temp 36.9 C 06/20/2020 1208  Pulse    Resp 17 07/08/2020 1208  SpO2 100 % 06/25/2020 1208    Last Pain:  Vitals:   06/23/2020 1208  TempSrc: Axillary  PainSc: 0-No pain         Complications: No complications documented.

## 2020-07-05 ENCOUNTER — Encounter (HOSPITAL_COMMUNITY): Payer: Self-pay | Admitting: Internal Medicine

## 2020-07-05 ENCOUNTER — Inpatient Hospital Stay (HOSPITAL_COMMUNITY): Payer: Medicare Other

## 2020-07-05 ENCOUNTER — Inpatient Hospital Stay (HOSPITAL_COMMUNITY): Payer: Medicare Other | Admitting: Certified Registered"

## 2020-07-05 ENCOUNTER — Encounter (HOSPITAL_COMMUNITY): Admission: EM | Disposition: E | Payer: Self-pay | Source: Home / Self Care | Attending: Critical Care Medicine

## 2020-07-05 DIAGNOSIS — K8050 Calculus of bile duct without cholangitis or cholecystitis without obstruction: Secondary | ICD-10-CM

## 2020-07-05 DIAGNOSIS — K8042 Calculus of bile duct with acute cholecystitis without obstruction: Secondary | ICD-10-CM | POA: Diagnosis not present

## 2020-07-05 HISTORY — PX: CHOLECYSTECTOMY: SHX55

## 2020-07-05 LAB — COMPREHENSIVE METABOLIC PANEL
ALT: 63 U/L — ABNORMAL HIGH (ref 0–44)
AST: 92 U/L — ABNORMAL HIGH (ref 15–41)
Albumin: 2.3 g/dL — ABNORMAL LOW (ref 3.5–5.0)
Alkaline Phosphatase: 147 U/L — ABNORMAL HIGH (ref 38–126)
Anion gap: 16 — ABNORMAL HIGH (ref 5–15)
BUN: 38 mg/dL — ABNORMAL HIGH (ref 8–23)
CO2: 23 mmol/L (ref 22–32)
Calcium: 8.3 mg/dL — ABNORMAL LOW (ref 8.9–10.3)
Chloride: 91 mmol/L — ABNORMAL LOW (ref 98–111)
Creatinine, Ser: 7.12 mg/dL — ABNORMAL HIGH (ref 0.44–1.00)
GFR calc Af Amer: 6 mL/min — ABNORMAL LOW (ref >60)
GFR calc non Af Amer: 5 mL/min — ABNORMAL LOW (ref >60)
Glucose, Bld: 73 mg/dL (ref 70–99)
Potassium: 3.8 mmol/L (ref 3.5–5.1)
Sodium: 130 mmol/L — ABNORMAL LOW (ref 135–145)
Total Bilirubin: 5.4 mg/dL — ABNORMAL HIGH (ref 0.3–1.2)
Total Protein: 6.2 g/dL — ABNORMAL LOW (ref 6.5–8.1)

## 2020-07-05 LAB — PROTIME-INR
INR: 1.6 — ABNORMAL HIGH (ref 0.8–1.2)
INR: 2 — ABNORMAL HIGH (ref 0.8–1.2)
Prothrombin Time: 18.3 seconds — ABNORMAL HIGH (ref 11.4–15.2)
Prothrombin Time: 22.2 seconds — ABNORMAL HIGH (ref 11.4–15.2)

## 2020-07-05 LAB — CBC
HCT: 23.5 % — ABNORMAL LOW (ref 36.0–46.0)
Hemoglobin: 7.7 g/dL — ABNORMAL LOW (ref 12.0–15.0)
MCH: 29.2 pg (ref 26.0–34.0)
MCHC: 32.8 g/dL (ref 30.0–36.0)
MCV: 89 fL (ref 80.0–100.0)
Platelets: 78 10*3/uL — ABNORMAL LOW (ref 150–400)
RBC: 2.64 MIL/uL — ABNORMAL LOW (ref 3.87–5.11)
RDW: 16.6 % — ABNORMAL HIGH (ref 11.5–15.5)
WBC: 4 10*3/uL (ref 4.0–10.5)
nRBC: 0 % (ref 0.0–0.2)

## 2020-07-05 LAB — BASIC METABOLIC PANEL
Anion gap: 16 — ABNORMAL HIGH (ref 5–15)
BUN: 34 mg/dL — ABNORMAL HIGH (ref 8–23)
CO2: 13 mmol/L — ABNORMAL LOW (ref 22–32)
Calcium: 6.8 mg/dL — ABNORMAL LOW (ref 8.9–10.3)
Chloride: 104 mmol/L (ref 98–111)
Creatinine, Ser: 5.82 mg/dL — ABNORMAL HIGH (ref 0.44–1.00)
GFR calc Af Amer: 8 mL/min — ABNORMAL LOW (ref >60)
GFR calc non Af Amer: 7 mL/min — ABNORMAL LOW (ref >60)
Glucose, Bld: 112 mg/dL — ABNORMAL HIGH (ref 70–99)
Potassium: 6.1 mmol/L — ABNORMAL HIGH (ref 3.5–5.1)
Sodium: 133 mmol/L — ABNORMAL LOW (ref 135–145)

## 2020-07-05 LAB — PREPARE RBC (CROSSMATCH)

## 2020-07-05 LAB — POCT I-STAT 7, (LYTES, BLD GAS, ICA,H+H)
Acid-base deficit: 12 mmol/L — ABNORMAL HIGH (ref 0.0–2.0)
Acid-base deficit: 15 mmol/L — ABNORMAL HIGH (ref 0.0–2.0)
Bicarbonate: 11.8 mmol/L — ABNORMAL LOW (ref 20.0–28.0)
Bicarbonate: 15 mmol/L — ABNORMAL LOW (ref 20.0–28.0)
Calcium, Ion: 0.68 mmol/L — CL (ref 1.15–1.40)
Calcium, Ion: 1.01 mmol/L — ABNORMAL LOW (ref 1.15–1.40)
HCT: 20 % — ABNORMAL LOW (ref 36.0–46.0)
HCT: 28 % — ABNORMAL LOW (ref 36.0–46.0)
Hemoglobin: 6.8 g/dL — CL (ref 12.0–15.0)
Hemoglobin: 9.5 g/dL — ABNORMAL LOW (ref 12.0–15.0)
O2 Saturation: 100 %
O2 Saturation: 100 %
Patient temperature: 92.4
Patient temperature: 92.8
Potassium: 5.4 mmol/L — ABNORMAL HIGH (ref 3.5–5.1)
Potassium: 5.5 mmol/L — ABNORMAL HIGH (ref 3.5–5.1)
Sodium: 137 mmol/L (ref 135–145)
Sodium: 138 mmol/L (ref 135–145)
TCO2: 13 mmol/L — ABNORMAL LOW (ref 22–32)
TCO2: 16 mmol/L — ABNORMAL LOW (ref 22–32)
pCO2 arterial: 25.5 mmHg — ABNORMAL LOW (ref 32.0–48.0)
pCO2 arterial: 32.9 mmHg (ref 32.0–48.0)
pH, Arterial: 7.249 — ABNORMAL LOW (ref 7.350–7.450)
pH, Arterial: 7.254 — ABNORMAL LOW (ref 7.350–7.450)
pO2, Arterial: 243 mmHg — ABNORMAL HIGH (ref 83.0–108.0)
pO2, Arterial: 249 mmHg — ABNORMAL HIGH (ref 83.0–108.0)

## 2020-07-05 LAB — FIBRINOGEN: Fibrinogen: 173 mg/dL — ABNORMAL LOW (ref 210–475)

## 2020-07-05 LAB — HEMOGLOBIN AND HEMATOCRIT, BLOOD
HCT: 28.6 % — ABNORMAL LOW (ref 36.0–46.0)
Hemoglobin: 9.6 g/dL — ABNORMAL LOW (ref 12.0–15.0)

## 2020-07-05 LAB — CBC WITH DIFFERENTIAL/PLATELET
Abs Immature Granulocytes: 0.27 10*3/uL — ABNORMAL HIGH (ref 0.00–0.07)
Basophils Absolute: 0 10*3/uL (ref 0.0–0.1)
Basophils Relative: 0 %
Eosinophils Absolute: 0.1 10*3/uL (ref 0.0–0.5)
Eosinophils Relative: 1 %
HCT: 32.9 % — ABNORMAL LOW (ref 36.0–46.0)
Hemoglobin: 10.6 g/dL — ABNORMAL LOW (ref 12.0–15.0)
Immature Granulocytes: 3 %
Lymphocytes Relative: 6 %
Lymphs Abs: 0.6 10*3/uL — ABNORMAL LOW (ref 0.7–4.0)
MCH: 30 pg (ref 26.0–34.0)
MCHC: 32.2 g/dL (ref 30.0–36.0)
MCV: 93.2 fL (ref 80.0–100.0)
Monocytes Absolute: 0.5 10*3/uL (ref 0.1–1.0)
Monocytes Relative: 6 %
Neutro Abs: 8.3 10*3/uL — ABNORMAL HIGH (ref 1.7–7.7)
Neutrophils Relative %: 84 %
Platelets: 79 10*3/uL — ABNORMAL LOW (ref 150–400)
RBC: 3.53 MIL/uL — ABNORMAL LOW (ref 3.87–5.11)
RDW: 15.2 % (ref 11.5–15.5)
WBC: 9.8 10*3/uL (ref 4.0–10.5)
nRBC: 1.8 % — ABNORMAL HIGH (ref 0.0–0.2)

## 2020-07-05 LAB — GLUCOSE, CAPILLARY: Glucose-Capillary: 111 mg/dL — ABNORMAL HIGH (ref 70–99)

## 2020-07-05 LAB — MAGNESIUM: Magnesium: 1.8 mg/dL (ref 1.7–2.4)

## 2020-07-05 LAB — LACTIC ACID, PLASMA: Lactic Acid, Venous: 7.9 mmol/L (ref 0.5–1.9)

## 2020-07-05 SURGERY — LAPAROSCOPIC CHOLECYSTECTOMY
Anesthesia: General | Site: Abdomen

## 2020-07-05 MED ORDER — NOREPINEPHRINE 4 MG/250ML-% IV SOLN
0.0000 ug/min | INTRAVENOUS | Status: DC
  Administered 2020-07-05: 10 ug/min via INTRAVENOUS
  Administered 2020-07-05: 40 ug/min via INTRAVENOUS
  Filled 2020-07-05 (×3): qty 250

## 2020-07-05 MED ORDER — PHENYLEPHRINE HCL-NACL 10-0.9 MG/250ML-% IV SOLN
0.0000 ug/min | INTRAVENOUS | Status: DC
  Administered 2020-07-05: 20 ug/min via INTRAVENOUS
  Administered 2020-07-06: 400 ug/min via INTRAVENOUS
  Filled 2020-07-05 (×3): qty 250

## 2020-07-05 MED ORDER — SODIUM CHLORIDE 0.9 % IV SOLN: INTRAVENOUS | Status: DC

## 2020-07-05 MED ORDER — SODIUM CHLORIDE 0.9% IV SOLUTION: Freq: Once | INTRAVENOUS | Status: DC

## 2020-07-05 MED ORDER — SODIUM BICARBONATE 8.4 % IV SOLN
100.0000 meq | Freq: Once | INTRAVENOUS | Status: AC
  Administered 2020-07-05: 100 meq via INTRAVENOUS
  Filled 2020-07-05: qty 100

## 2020-07-05 MED ORDER — DEXAMETHASONE SODIUM PHOSPHATE 10 MG/ML IJ SOLN
INTRAMUSCULAR | Status: DC | PRN
  Administered 2020-07-05: 5 mg via INTRAVENOUS

## 2020-07-05 MED ORDER — TRANEXAMIC ACID-NACL 1000-0.7 MG/100ML-% IV SOLN
1000.0000 mg | Freq: Once | INTRAVENOUS | Status: AC
  Administered 2020-07-05: 1000 mg via INTRAVENOUS
  Filled 2020-07-05 (×2): qty 100

## 2020-07-05 MED ORDER — VASOPRESSIN 20 UNIT/ML IV SOLN
INTRAVENOUS | Status: DC | PRN
Start: 2020-07-05 — End: 2020-07-05
  Administered 2020-07-05 (×2): 3 [IU] via INTRAVENOUS
  Administered 2020-07-05: 1 [IU] via INTRAVENOUS
  Administered 2020-07-05 (×3): 3 [IU] via INTRAVENOUS
  Administered 2020-07-05 (×2): 2 [IU] via INTRAVENOUS

## 2020-07-05 MED ORDER — ONDANSETRON HCL 4 MG/2ML IJ SOLN
INTRAMUSCULAR | Status: DC | PRN
  Administered 2020-07-05: 4 mg via INTRAVENOUS

## 2020-07-05 MED ORDER — VITAMIN K1 10 MG/ML IJ SOLN
10.0000 mg | INTRAVENOUS | Status: AC
  Administered 2020-07-06: 10 mg via INTRAVENOUS
  Filled 2020-07-05: qty 1

## 2020-07-05 MED ORDER — BUPIVACAINE HCL (PF) 0.25 % IJ SOLN
INTRAMUSCULAR | Status: DC | PRN
  Administered 2020-07-05: 10 mL

## 2020-07-05 MED ORDER — ACETAMINOPHEN 325 MG PO TABS: 650.0000 mg | ORAL_TABLET | Freq: Four times a day (QID) | ORAL | Status: DC | PRN

## 2020-07-05 MED ORDER — PROPRANOLOL HCL 10 MG PO TABS: 10.0000 mg | ORAL_TABLET | Freq: Two times a day (BID) | ORAL | Status: DC

## 2020-07-05 MED ORDER — BOOST / RESOURCE BREEZE PO LIQD CUSTOM: 1.0000 | Freq: Three times a day (TID) | ORAL | Status: DC

## 2020-07-05 MED ORDER — HYDROMORPHONE HCL 1 MG/ML IJ SOLN: 0.2500 mg | INTRAMUSCULAR | Status: DC | PRN

## 2020-07-05 MED ORDER — PHENYLEPHRINE HCL-NACL 10-0.9 MG/250ML-% IV SOLN
INTRAVENOUS | Status: DC | PRN
  Administered 2020-07-05: 25 ug/min via INTRAVENOUS

## 2020-07-05 MED ORDER — 0.9 % SODIUM CHLORIDE (POUR BTL) OPTIME
TOPICAL | Status: DC | PRN
  Administered 2020-07-05: 1000 mL

## 2020-07-05 MED ORDER — LIDOCAINE 2% (20 MG/ML) 5 ML SYRINGE
INTRAMUSCULAR | Status: AC
  Filled 2020-07-05: qty 5

## 2020-07-05 MED ORDER — ROCURONIUM BROMIDE 10 MG/ML (PF) SYRINGE
PREFILLED_SYRINGE | INTRAVENOUS | Status: DC | PRN
  Administered 2020-07-05: 10 mg via INTRAVENOUS
  Administered 2020-07-05: 20 mg via INTRAVENOUS

## 2020-07-05 MED ORDER — CALCIUM CHLORIDE 10 % IV SOLN
1.0000 g | Freq: Once | INTRAVENOUS | Status: AC
  Administered 2020-07-05: 1 g via INTRAVENOUS
  Filled 2020-07-05 (×3): qty 10

## 2020-07-05 MED ORDER — BUPIVACAINE HCL (PF) 0.25 % IJ SOLN
INTRAMUSCULAR | Status: AC
  Filled 2020-07-05: qty 30

## 2020-07-05 MED ORDER — SODIUM CHLORIDE 0.9 % IR SOLN
Status: DC | PRN
  Administered 2020-07-05: 1000 mL

## 2020-07-05 MED ORDER — SODIUM CHLORIDE 0.9 % IV BOLUS: 500.0000 mL | Freq: Once | INTRAVENOUS | Status: DC

## 2020-07-05 MED ORDER — OXYCODONE HCL 5 MG/5ML PO SOLN: 5.0000 mg | Freq: Once | ORAL | Status: DC | PRN

## 2020-07-05 MED ORDER — FENTANYL CITRATE (PF) 250 MCG/5ML IJ SOLN
INTRAMUSCULAR | Status: DC | PRN
  Administered 2020-07-05: 50 ug via INTRAVENOUS

## 2020-07-05 MED ORDER — INSULIN ASPART 100 UNIT/ML IV SOLN
10.0000 [IU] | Freq: Once | INTRAVENOUS | Status: AC
  Administered 2020-07-05: 10 [IU] via INTRAVENOUS

## 2020-07-05 MED ORDER — PROPRANOLOL HCL 20 MG PO TABS
20.0000 mg | ORAL_TABLET | Freq: Two times a day (BID) | ORAL | Status: DC
  Filled 2020-07-05 (×2): qty 1

## 2020-07-05 MED ORDER — VASOPRESSIN 20 UNITS/100 ML INFUSION FOR SHOCK
0.0400 [IU]/min | INTRAVENOUS | Status: DC
  Administered 2020-07-05 – 2020-07-07 (×4): 0.04 [IU]/min via INTRAVENOUS
  Filled 2020-07-05 (×5): qty 100

## 2020-07-05 MED ORDER — PROPOFOL 10 MG/ML IV BOLUS
INTRAVENOUS | Status: AC
  Filled 2020-07-05: qty 20

## 2020-07-05 MED ORDER — SODIUM CHLORIDE 0.9 % IV SOLN
250.0000 mL | INTRAVENOUS | Status: DC
  Administered 2020-07-05 – 2020-07-13 (×2): 250 mL via INTRAVENOUS

## 2020-07-05 MED ORDER — LIDOCAINE 2% (20 MG/ML) 5 ML SYRINGE
INTRAMUSCULAR | Status: DC | PRN
  Administered 2020-07-05: 70 mg via INTRAVENOUS

## 2020-07-05 MED ORDER — NOREPINEPHRINE 16 MG/250ML-% IV SOLN
0.0000 ug/min | INTRAVENOUS | Status: DC
  Administered 2020-07-05: 52 ug/min via INTRAVENOUS
  Administered 2020-07-06: 50 ug/min via INTRAVENOUS
  Filled 2020-07-05 (×3): qty 250

## 2020-07-05 MED ORDER — CHLORHEXIDINE GLUCONATE CLOTH 2 % EX PADS
6.0000 | MEDICATED_PAD | Freq: Every day | CUTANEOUS | Status: DC
  Administered 2020-07-05 – 2020-07-07 (×3): 6 via TOPICAL

## 2020-07-05 MED ORDER — SUCCINYLCHOLINE 20MG/ML (10ML) SYRINGE FOR MEDFUSION PUMP - OPTIME
INTRAMUSCULAR | Status: DC | PRN
  Administered 2020-07-05: 80 mg via INTRAVENOUS

## 2020-07-05 MED ORDER — DOXERCALCIFEROL 4 MCG/2ML IV SOLN
7.0000 ug | INTRAVENOUS | Status: DC
  Filled 2020-07-05 (×3): qty 4

## 2020-07-05 MED ORDER — SUGAMMADEX SODIUM 200 MG/2ML IV SOLN
INTRAVENOUS | Status: DC | PRN
  Administered 2020-07-05: 100 mg via INTRAVENOUS

## 2020-07-05 MED ORDER — GLUCAGON HCL RDNA (DIAGNOSTIC) 1 MG IJ SOLR
INTRAMUSCULAR | Status: AC
  Filled 2020-07-05: qty 1

## 2020-07-05 MED ORDER — PHENYLEPHRINE HCL-NACL 10-0.9 MG/250ML-% IV SOLN
25.0000 ug/min | INTRAVENOUS | Status: DC
  Administered 2020-07-05 (×2): 100 ug/min via INTRAVENOUS
  Filled 2020-07-05 (×2): qty 250

## 2020-07-05 MED ORDER — ONDANSETRON HCL 4 MG/2ML IJ SOLN
INTRAMUSCULAR | Status: AC
  Filled 2020-07-05: qty 2

## 2020-07-05 MED ORDER — ROCURONIUM BROMIDE 10 MG/ML (PF) SYRINGE
PREFILLED_SYRINGE | INTRAVENOUS | Status: AC
  Filled 2020-07-05: qty 10

## 2020-07-05 MED ORDER — DEXAMETHASONE SODIUM PHOSPHATE 10 MG/ML IJ SOLN
INTRAMUSCULAR | Status: AC
  Filled 2020-07-05: qty 1

## 2020-07-05 MED ORDER — VASOPRESSIN 20 UNITS/100 ML INFUSION FOR SHOCK
0.0400 [IU]/min | INTRAVENOUS | Status: DC
  Administered 2020-07-05: 0.03 [IU]/min via INTRAVENOUS
  Filled 2020-07-05: qty 100

## 2020-07-05 MED ORDER — FENTANYL CITRATE (PF) 250 MCG/5ML IJ SOLN
INTRAMUSCULAR | Status: AC
  Filled 2020-07-05: qty 5

## 2020-07-05 MED ORDER — CHLORHEXIDINE GLUCONATE 0.12 % MT SOLN
15.0000 mL | Freq: Once | OROMUCOSAL | Status: DC
  Filled 2020-07-05: qty 15

## 2020-07-05 MED ORDER — PROPOFOL 10 MG/ML IV BOLUS
INTRAVENOUS | Status: DC | PRN
  Administered 2020-07-05: 100 mg via INTRAVENOUS

## 2020-07-05 MED ORDER — SODIUM CHLORIDE 0.9 % IV SOLN
0.3000 ug/kg | Freq: Once | INTRAVENOUS | Status: AC
  Administered 2020-07-05: 15.6 ug via INTRAVENOUS
  Filled 2020-07-05: qty 3.9

## 2020-07-05 MED ORDER — EPHEDRINE SULFATE-NACL 50-0.9 MG/10ML-% IV SOSY
PREFILLED_SYRINGE | INTRAVENOUS | Status: DC | PRN
  Administered 2020-07-05: 15 mg via INTRAVENOUS
  Administered 2020-07-05: 5 mg via INTRAVENOUS
  Administered 2020-07-05: 10 mg via INTRAVENOUS

## 2020-07-05 MED ORDER — SODIUM CHLORIDE 0.9 % IV SOLN: INTRAVENOUS | Status: DC | PRN

## 2020-07-05 MED ORDER — SODIUM CHLORIDE 0.9 % IV BOLUS: 250.0000 mL | Freq: Once | INTRAVENOUS | Status: DC

## 2020-07-05 MED ORDER — EPINEPHRINE 1 MG/10ML IJ SOSY
PREFILLED_SYRINGE | INTRAMUSCULAR | Status: AC
  Filled 2020-07-05: qty 10

## 2020-07-05 MED ORDER — ALBUMIN HUMAN 5 % IV SOLN
INTRAVENOUS | Status: AC
  Filled 2020-07-05: qty 250

## 2020-07-05 MED ORDER — METHOCARBAMOL 1000 MG/10ML IJ SOLN
500.0000 mg | Freq: Four times a day (QID) | INTRAVENOUS | Status: DC | PRN
  Filled 2020-07-05: qty 5

## 2020-07-05 MED ORDER — PHENYLEPHRINE 40 MCG/ML (10ML) SYRINGE FOR IV PUSH (FOR BLOOD PRESSURE SUPPORT)
PREFILLED_SYRINGE | INTRAVENOUS | Status: DC | PRN
  Administered 2020-07-05 (×3): 120 ug via INTRAVENOUS

## 2020-07-05 MED ORDER — DEXTROSE 50 % IV SOLN
1.0000 | Freq: Once | INTRAVENOUS | Status: AC
  Administered 2020-07-05: 50 mL via INTRAVENOUS
  Filled 2020-07-05: qty 50

## 2020-07-05 MED ORDER — ALBUMIN HUMAN 5 % IV SOLN: INTRAVENOUS | Status: DC | PRN

## 2020-07-05 MED ORDER — OXYCODONE HCL 5 MG PO TABS: 5.0000 mg | ORAL_TABLET | Freq: Once | ORAL | Status: DC | PRN

## 2020-07-05 SURGICAL SUPPLY — 60 items
APL PRP STRL LF DISP 70% ISPRP (MISCELLANEOUS) ×2
APL SKNCLS STERI-STRIP NONHPOA (GAUZE/BANDAGES/DRESSINGS)
APPLIER CLIP ROT 10 11.4 M/L (STAPLE) ×4
APR CLP MED LRG 11.4X10 (STAPLE) ×2
BAG DRN RND TRDRP ANRFLXCHMBR (UROLOGICAL SUPPLIES) ×2
BAG SPEC RTRVL 10 TROC 200 (ENDOMECHANICALS)
BAG SPEC RTRVL LRG 6X4 10 (ENDOMECHANICALS)
BAG URINE DRAIN 2000ML AR STRL (UROLOGICAL SUPPLIES) ×3 IMPLANT
BENZOIN TINCTURE PRP APPL 2/3 (GAUZE/BANDAGES/DRESSINGS) ×1 IMPLANT
BIOPATCH RED 1 DISK 7.0 (GAUZE/BANDAGES/DRESSINGS) ×4 IMPLANT
BIOPATCH RED 1IN DISK 7.0MM (GAUZE/BANDAGES/DRESSINGS) ×2
BLADE CLIPPER SURG (BLADE) IMPLANT
CANISTER SUCT 3000ML PPV (MISCELLANEOUS) ×4 IMPLANT
CATH FOLEY 2WAY SLVR  5CC 14FR (CATHETERS) ×4
CATH FOLEY 2WAY SLVR 5CC 14FR (CATHETERS) ×1 IMPLANT
CHLORAPREP W/TINT 26 (MISCELLANEOUS) ×4 IMPLANT
CLIP APPLIE ROT 10 11.4 M/L (STAPLE) ×2 IMPLANT
CLOSURE WOUND 1/2 X4 (GAUZE/BANDAGES/DRESSINGS)
COVER MAYO STAND STRL (DRAPES) ×4 IMPLANT
COVER SURGICAL LIGHT HANDLE (MISCELLANEOUS) ×4 IMPLANT
COVER WAND RF STERILE (DRAPES) ×1 IMPLANT
DISSECTOR BLUNT TIP ENDO 5MM (MISCELLANEOUS) ×3 IMPLANT
DRAIN CHANNEL 19F RND (DRAIN) ×3 IMPLANT
DRAPE C-ARM 42X120 X-RAY (DRAPES) ×4 IMPLANT
DRSG TEGADERM 2-3/8X2-3/4 SM (GAUZE/BANDAGES/DRESSINGS) ×9 IMPLANT
DRSG TEGADERM 4X4.75 (GAUZE/BANDAGES/DRESSINGS) ×7 IMPLANT
ELECT REM PT RETURN 9FT ADLT (ELECTROSURGICAL) ×4
ELECTRODE REM PT RTRN 9FT ADLT (ELECTROSURGICAL) ×2 IMPLANT
EVACUATOR SILICONE 100CC (DRAIN) ×3 IMPLANT
GAUZE SPONGE 2X2 8PLY STRL LF (GAUZE/BANDAGES/DRESSINGS) ×3 IMPLANT
GLOVE BIO SURGEON STRL SZ7 (GLOVE) ×4 IMPLANT
GLOVE BIOGEL PI IND STRL 7.5 (GLOVE) ×2 IMPLANT
GLOVE BIOGEL PI INDICATOR 7.5 (GLOVE) ×2
GOWN STRL REUS W/ TWL LRG LVL3 (GOWN DISPOSABLE) ×4 IMPLANT
GOWN STRL REUS W/TWL LRG LVL3 (GOWN DISPOSABLE) ×4
KIT BASIN OR (CUSTOM PROCEDURE TRAY) ×4 IMPLANT
KIT TURNOVER KIT B (KITS) ×4 IMPLANT
NS IRRIG 1000ML POUR BTL (IV SOLUTION) ×4 IMPLANT
PAD ARMBOARD 7.5X6 YLW CONV (MISCELLANEOUS) ×4 IMPLANT
POUCH RETRIEVAL ECOSAC 10 (ENDOMECHANICALS) IMPLANT
POUCH RETRIEVAL ECOSAC 10MM (ENDOMECHANICALS)
POUCH SPECIMEN RETRIEVAL 10MM (ENDOMECHANICALS) IMPLANT
SCISSORS LAP 5X35 DISP (ENDOMECHANICALS) ×4 IMPLANT
SET CHOLANGIOGRAPH 5 50 .035 (SET/KITS/TRAYS/PACK) ×4 IMPLANT
SET IRRIG TUBING LAPAROSCOPIC (IRRIGATION / IRRIGATOR) ×4 IMPLANT
SET TUBE SMOKE EVAC HIGH FLOW (TUBING) ×4 IMPLANT
SLEEVE ENDOPATH XCEL 5M (ENDOMECHANICALS) ×4 IMPLANT
SPECIMEN JAR SMALL (MISCELLANEOUS) ×1 IMPLANT
SPONGE GAUZE 2X2 STER 10/PKG (GAUZE/BANDAGES/DRESSINGS) ×4
STRIP CLOSURE SKIN 1/2X4 (GAUZE/BANDAGES/DRESSINGS) ×1 IMPLANT
SUT ETHILON 2 0 FS 18 (SUTURE) ×3 IMPLANT
SUT MNCRL AB 4-0 PS2 18 (SUTURE) ×4 IMPLANT
SYR 10ML LL (SYRINGE) ×3 IMPLANT
TOWEL GREEN STERILE (TOWEL DISPOSABLE) ×4 IMPLANT
TOWEL GREEN STERILE FF (TOWEL DISPOSABLE) ×4 IMPLANT
TRAY LAPAROSCOPIC MC (CUSTOM PROCEDURE TRAY) ×4 IMPLANT
TROCAR XCEL BLUNT TIP 100MML (ENDOMECHANICALS) ×4 IMPLANT
TROCAR XCEL NON-BLD 11X100MML (ENDOMECHANICALS) ×4 IMPLANT
TROCAR XCEL NON-BLD 5MMX100MML (ENDOMECHANICALS) ×4 IMPLANT
WATER STERILE IRR 1000ML POUR (IV SOLUTION) ×4 IMPLANT

## 2020-07-05 NOTE — Progress Notes (Deleted)
[facility] Surgery Consult Note  Sarah Harmon 1946-05-28  [id number].    Requesting MD: Inda Merlin Chief Complaint/Reason for Consult: choledocholithiasis  HPI:  Sarah Harmon is a 74yo female PMH ESRD on HD T/Th/Sat, hepatocellular carcinoma (dx 02/2015 S/P thermoablation follows with Dr. Burr Medico), alcoholic liver cirrhosis, HTN, HLD, who was admitted to [facility] 7/17 with choledocholithiasis and possible acute cholecystitis. Patient states that last Wednesday she developed diffuse abdominal pain after eating breakfast, but it resolved after short period of time without intervention. She had never had pain like this before. On Saturday the pain returned and was more severe. States that pain is diffuse but is more severe in her upper abdomen. Admits to nausea, no emesis. Describes her abdomen as feeling feverish. She does think that the pain is worse with PO intake. Prior to last week she does reporting noticing a decline in her abdomen and unintentional weight loss of unknown amount.  ED work up included MRCP which shows choledocholithiasis, possible acute cholecystitis with gallbladder distension and mild pericholecystic edema, concern for possible gallbladder neoplasm, and cirrhosis with trace abdominal ascites. Initial labs WBC 9.3, AST 169, ALT 116, AP 147, Tbili 5, lipase 34. Gastroenterology was consulted and took the patient for ERCP yesterday. Due to her distorted anatomy (very dilated gallbladder and cystic duct) no papillary orifice was seen and attempts at cholangiogram were failed; apeears to be a very distal stone. Per their note they will regroup regarding endoscopic removal of CBD stones, will have colleagues review though not sure our advanced interventional endoscopist will be available to do a procedure. General surgery asked to see.  Abdominal surgical history: hysterectomy Anticoagulants: none Nonsmoker Denies alcohol use x5 years, never a heavy drinker, reports  drinking 6-pack of beer weekly Lives at home with her son Uses walker when out of her home for ambulation  Review of Systems  Constitutional: Positive for weight loss. Negative for chills and fever.  Respiratory: Negative.   Cardiovascular: Negative.   Gastrointestinal: Positive for abdominal pain and nausea. Negative for constipation, diarrhea and vomiting.       Anorexia  Skin: Negative.    All systems reviewed and otherwise negative except for as above  Family History  Problem Relation Age of Onset  . Lung cancer Brother   . Breast cancer Other   . Breast cancer Sister   . Gastric cancer Sister   . Lung cancer Sister   . Colon cancer Neg Hx   . Colon polyps Neg Hx   . Rectal cancer Neg Hx   . Stomach cancer Neg Hx   . Esophageal cancer Neg Hx     Past Medical History:  Diagnosis Date  . ALCOHOL ABUSE 07/13/2010   Quit drinking 2016  . Depression with anxiety 12/22/2009  . 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 DJD (degenerative joint disease), cervical    patient denies on preop of 05/10/15   . ESRD (end stage renal disease) on dialysis ([place])    HD TTS  . GALLSTONES 12/22/2009  . Headache    occasional   . Hepatocellular carcinoma ([place]) 2016   04/2015 RFA.  Marland Kitchen HYPERLIPIDEMIA 12/22/2009  . HYPERTENSION 12/22/2009  . HYPONATREMIA 12/22/2009  . 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 OSTEOPENIA 12/22/2009  . Pancytopenia 07/13/2010  . RENAL CYST, LEFT 12/22/2009  . VITAMIN D DEFICIENCY 12/23/2010  . WEIGHT LOSS 12/23/2010    Past Surgical History:  Procedure Laterality Date  . A/V FISTULAGRAM N/A 10/25/2018   Procedure:  A/V FISTULAGRAM;  Surgeon: Elam Dutch, MD;  Location: [facility];  Service: Cardiovascular;  Laterality: N/A;  . ABDOMINAL HYSTERECTOMY    . APPENDECTOMY    . AV FISTULA PLACEMENT Left 05/21/2018   Procedure: INSERTION OF ARTERIOVENOUS (AV) GORE-TEX GRAFT UPPER ARM;  Surgeon: Elam Dutch, MD;  Location: [facility] OR;  Service: Vascular;   Laterality: Left;  . AV FISTULA PLACEMENT Left 06/03/2018   Procedure: INSERTION OF ARTERIOVENOUS (AV) GORE-TEX 46mm x 10cm GRAFT INTO LEFT ARM;  Surgeon: Elam Dutch, MD;  Location: [facility];  Service: Vascular;  Laterality: Left;  . COLONOSCOPY    . ESOPHAGOGASTRODUODENOSCOPY    . IR GENERIC HISTORICAL  10/18/2016   IR RADIOLOGIST EVAL & MGMT 10/18/2016 [name], MD [facility] INTERV RAD  . IR RADIOLOGIST EVAL & MGMT  05/30/2017  . IR RADIOLOGIST EVAL & MGMT  08/28/2018  . IR RADIOLOGIST EVAL & MGMT  10/08/2019  . IR RADIOLOGIST EVAL & MGMT  10/30/2019  . surgery for liver ca    . THROMBECTOMY AND REVISION OF ARTERIOVENTOUS (AV) GORETEX  GRAFT Left 06/03/2018   Procedure: THROMBECTOMY AND REVISION OF ARTERIOVENTOUS (AV) GORETEX  GRAFT ARM;  Surgeon: Elam Dutch, MD;  Location: [facility] OR;  Service: Vascular;  Laterality: Left;    Social History:  reports that she has never smoked. She has never used smokeless tobacco. She reports previous alcohol use of about 3.0 - 4.0 standard drinks of alcohol per week. She reports that she does not use drugs.  Allergies: No Known Allergies  Medications Prior to Admission  Medication Sig Dispense Refill  . Acetaminophen (TYLENOL PO) Take 1 tablet by mouth 3 (three) times a week.    . ALPRAZolam (XANAX) 0.5 MG tablet TAKE 1 TABLET BY MOUTH ONCE DAILY AT BEDTIME (Patient taking differently: Take 0.5 mg by mouth at bedtime. ) 30 tablet 5  . amLODipine (NORVASC) 10 MG tablet Take 1 tablet by mouth once daily (Patient taking differently: Take 10 mg by mouth daily. ) 90 tablet 0  . B Complex-C-Zn-Folic Acid (DIALYVITE 101 WITH ZINC) 0.8 MG TABS Take 1 tablet by mouth daily.  12  . CONSTULOSE 10 GM/15ML solution TAKE 30 ML BY MOUTH ONCE DAILY (Patient taking differently: Take 20 g by mouth daily. ) 751 mL 0  . folic acid (FOLVITE) 1 MG tablet Take 1 tablet by mouth once daily (Patient taking differently: Take 1 mg by mouth daily. ) 90 tablet 2  . hydrALAZINE  (APRESOLINE) 100 MG tablet TAKE 1 TABLET BY MOUTH THREE TIMES DAILY (Patient taking differently: Take 100 mg by mouth 3 (three) times daily. ) 270 tablet 2  . ketotifen (ZADITOR) 0.025 % ophthalmic solution Place 1 drop into both eyes 2 (two) times daily.    Marland Kitchen lidocaine-prilocaine (EMLA) cream Apply 1 application topically daily as needed (use before dialysis).   12  . propranolol (INDERAL) 40 MG tablet Take 1 tablet by mouth twice daily (Patient taking differently: Take 40 mg by mouth 2 (two) times daily. ) 180 tablet 1  . Propylene Glycol (SYSTANE BALANCE) 0.6 % SOLN Place 1 drop into both eyes daily.    . Darbepoetin Alfa (ARANESP) 100 MCG/0.5ML SOSY injection Inject 0.5 mLs (100 mcg total) into the vein every Thursday with hemodialysis. 4.2 mL   . Methoxy PEG-Epoetin Beta (MIRCERA IJ) Mircera      Prior to Admission medications   Medication Sig Start Date End Date Taking? Authorizing Provider  Acetaminophen (TYLENOL  PO) Take 1 tablet by mouth 3 (three) times a week.   Yes [provider]  ALPRAZolam (XANAX) 0.5 MG tablet TAKE 1 TABLET BY MOUTH ONCE DAILY AT BEDTIME Patient taking differently: Take 0.5 mg by mouth at bedtime.  06/23/20  Yes Biagio Borg, MD  amLODipine (NORVASC) 10 MG tablet Take 1 tablet by mouth once daily Patient taking differently: Take 10 mg by mouth daily.  06/04/20  Yes Biagio Borg, MD  B Complex-C-Zn-Folic Acid (DIALYVITE 786 WITH ZINC) 0.8 MG TABS Take 1 tablet by mouth daily. 09/29/18  Yes [provider]  CONSTULOSE 10 GM/15ML solution TAKE 30 ML BY MOUTH ONCE DAILY Patient taking differently: Take 20 g by mouth daily.  05/19/20  Yes Biagio Borg, MD  folic acid (FOLVITE) 1 MG tablet Take 1 tablet by mouth once daily Patient taking differently: Take 1 mg by mouth daily.  01/26/20  Yes Biagio Borg, MD  hydrALAZINE (APRESOLINE) 100 MG tablet TAKE 1 TABLET BY MOUTH THREE TIMES DAILY Patient taking differently: Take 100 mg by mouth 3 (three) times  daily.  01/19/20  Yes Biagio Borg, MD  ketotifen (ZADITOR) 0.025 % ophthalmic solution Place 1 drop into both eyes 2 (two) times daily.   Yes [provider]  lidocaine-prilocaine (EMLA) cream Apply 1 application topically daily as needed (use before dialysis).  10/11/18  Yes [provider]  propranolol (INDERAL) 40 MG tablet Take 1 tablet by mouth twice daily Patient taking differently: Take 40 mg by mouth 2 (two) times daily.  04/06/20  Yes Biagio Borg, MD  Propylene Glycol (SYSTANE BALANCE) 0.6 % SOLN Place 1 drop into both eyes daily.   Yes [provider]  Darbepoetin Alfa (ARANESP) 100 MCG/0.5ML SOSY injection Inject 0.5 mLs (100 mcg total) into the vein every Thursday with hemodialysis. 07/25/18   Cherene Altes, MD  Methoxy PEG-Epoetin Beta (MIRCERA IJ) Mircera 04/06/20 04/05/21  [provider]    Blood pressure (!) 99/48, pulse (!) 57, temperature 97.7 F (36.5 C), temperature source Oral, resp. rate 18, weight 52.4 kg, SpO2 100 %. Physical Exam: General: pleasant, chronically ill appearing black female who is laying in bed in NAD HEENT: head is normocephalic, atraumatic.  Sclera are noninjected.  Pupils equal and round.  Ears and nose without any masses or lesions.  Mouth is pink and moist. [place]ent[place] Heart: regular, rate, and rhythm.  ?murmur. Palpable pedal pulses bilaterally  Lungs: CTAB, no wheezes, rhonchi, or rales noted.  Respiratory effort nonlabored Abd: soft, ND, +BS, no masses, hernias, or organomegaly. Mild diffuse tenderness with more moderate TTP in the upper quadrants R>L MS: no BUE/BLE edema, calves soft and nontender Skin: warm and dry with no masses, lesions, or rashes Psych: A&Ox4 with an appropriate affect Neuro: cranial nerves grossly intact, equal strength in BUE/BLE bilaterally, normal speech, thought process intact  Results for orders placed or performed during the hospital encounter of 06/18/2020 (from the past 48 hour(s))   Lipase, blood     Status: None   Collection Time: 07/08/2020  3:06 PM  Result Value Ref Range   Lipase 34 11 - 51 U/L    Comment: Performed at [facility], [place] [address], [place],  [address]  Comprehensive metabolic panel     Status: Abnormal   Collection Time: 07/07/2020  3:06 PM  Result Value Ref Range   Sodium 133 (L) 135 - 145 mmol/L   Potassium 3.8 3.5 - 5.1 mmol/L  Chloride 93 (L) 98 - 111 mmol/L   CO2 27 22 - 32 mmol/L   Glucose, Bld 101 (H) 70 - 99 mg/dL    Comment: Glucose reference range applies only to samples taken after fasting for at least 8 hours.   BUN 12 8 - 23 mg/dL   Creatinine, Ser 3.53 (H) 0.44 - 1.00 mg/dL   Calcium 8.9 8.9 - 10.3 mg/dL   Total Protein 7.3 6.5 - 8.1 g/dL   Albumin 2.9 (L) 3.5 - 5.0 g/dL   AST 169 (H) 15 - 41 U/L   ALT 116 (H) 0 - 44 U/L   Alkaline Phosphatase 147 (H) 38 - 126 U/L   Total Bilirubin 5.0 (H) 0.3 - 1.2 mg/dL   GFR calc non Af Amer 12 (L) >60 mL/min   GFR calc Af Amer 14 (L) >60 mL/min   Anion gap 13 5 - 15    Comment: Performed at [facility] [address], [place], [place] [address]  CBC     Status: Abnormal   Collection Time: 06/23/2020  3:06 PM  Result Value Ref Range   WBC 9.3 4.0 - 10.5 K/uL   RBC 3.23 (L) 3.87 - 5.11 MIL/uL   Hemoglobin 9.4 (L) 12.0 - 15.0 g/dL   HCT 28.8 (L) 36 - 46 %   MCV 89.2 80.0 - 100.0 fL   MCH 29.1 26.0 - 34.0 pg   MCHC 32.6 30.0 - 36.0 g/dL   RDW 16.5 (H) 11.5 - 15.5 %   Platelets 107 (L) 150 - 400 K/uL    Comment: SPECIMEN CHECKED FOR CLOTS Immature Platelet Fraction may be clinically indicated, consider ordering this additional test [id number] SPECIMEN CHECKED FOR CLOTS PLATELET COUNT CONFIRMED BY SMEAR    nRBC 0.0 0.0 - 0.2 %    Comment: Performed at [facility], [place] [address] [place], [place] [address]  SARS Coronavirus 2 by RT PCR (hospital order, performed in [facility] hospital lab) Nasopharyngeal Nasopharyngeal Swab     Status: None   Collection  Time: 06/30/2020 10:14 PM   Specimen: Nasopharyngeal Swab  Result Value Ref Range   SARS Coronavirus 2 NEGATIVE NEGATIVE    Comment: (NOTE) SARS-CoV-2 target nucleic acids are NOT DETECTED.  The SARS-CoV-2 RNA is generally detectable in upper and lower respiratory specimens during the acute phase of infection. The lowest concentration of SARS-CoV-2 viral copies this assay can detect is 250 copies / mL. A negative result does not preclude SARS-CoV-2 infection and should not be used as the sole basis for treatment or other patient management decisions.  A negative result may occur with improper specimen collection / handling, submission of specimen other than nasopharyngeal swab, presence of viral mutation(s) within the areas targeted by this assay, and inadequate number of viral copies (<250 copies / mL). A negative result must be combined with clinical observations, patient history, and epidemiological information.  Fact Sheet for Patients:   StrictlyIdeas.no  Fact Sheet for Healthcare Providers: BankingDealers.co.za  This test is not yet approved or  cleared by the [place] FDA and has been authorized for detection and/or diagnosis of SARS-CoV-2 by FDA under an Emergency Use Authorization (EUA).  This EUA will remain in effect (meaning this test can be used) for the duration of the COVID-19 declaration under Section 564(b)(1) of the Act, 21 U.S.C. section 360bbb-3(b)(1), unless the authorization is terminated or revoked sooner.  Performed at [facility], [place] [address] [place], [place] [address]  Magnesium     Status: None   Collection Time: 07/03/2020 12:26 AM  Result Value Ref Range   Magnesium 1.8 1.7 - 2.4 mg/dL    Comment: Performed at [facility], [place] [address] [place], [place] [address]  CBC WITH DIFFERENTIAL     Status: Abnormal   Collection Time: 06/25/2020 12:26 AM  Result Value Ref Range   WBC 7.5  4.0 - 10.5 K/uL   RBC 2.81 (L) 3.87 - 5.11 MIL/uL   Hemoglobin 8.4 (L) 12.0 - 15.0 g/dL   HCT 25.6 (L) 36 - 46 %   MCV 91.1 80.0 - 100.0 fL   MCH 29.9 26.0 - 34.0 pg   MCHC 32.8 30.0 - 36.0 g/dL   RDW 16.8 (H) 11.5 - 15.5 %   Platelets 99 (L) 150 - 400 K/uL    Comment: Immature Platelet Fraction may be clinically indicated, consider ordering this additional test [id number] CONSISTENT WITH PREVIOUS RESULT    nRBC 0.0 0.0 - 0.2 %   Neutrophils Relative % 80 %   Neutro Abs 6.0 1.7 - 7.7 K/uL   Lymphocytes Relative 7 %   Lymphs Abs 0.5 (L) 0.7 - 4.0 K/uL   Monocytes Relative 12 %   Monocytes Absolute 0.9 0 - 1 K/uL   Eosinophils Relative 0 %   Eosinophils Absolute 0.0 0 - 0 K/uL   Basophils Relative 0 %   Basophils Absolute 0.0 0 - 0 K/uL   Immature Granulocytes 1 %   Abs Immature Granulocytes 0.04 0.00 - 0.07 K/uL    Comment: Performed at [facility], [place] [address], [place], [place] [address]  Comprehensive metabolic panel     Status: Abnormal   Collection Time: 07/11/2020 12:26 AM  Result Value Ref Range   Sodium 129 (L) 135 - 145 mmol/L   Potassium 3.9 3.5 - 5.1 mmol/L   Chloride 91 (L) 98 - 111 mmol/L   CO2 26 22 - 32 mmol/L   Glucose, Bld 93 70 - 99 mg/dL    Comment: Glucose reference range applies only to samples taken after fasting for at least 8 hours.   BUN 20 8 - 23 mg/dL   Creatinine, Ser 4.49 (H) 0.44 - 1.00 mg/dL   Calcium 8.8 (L) 8.9 - 10.3 mg/dL   Total Protein 6.7 6.5 - 8.1 g/dL   Albumin 2.5 (L) 3.5 - 5.0 g/dL   AST 132 (H) 15 - 41 U/L   ALT 93 (H) 0 - 44 U/L   Alkaline Phosphatase 141 (H) 38 - 126 U/L   Total Bilirubin 5.1 (H) 0.3 - 1.2 mg/dL   GFR calc non Af Amer 9 (L) >60 mL/min   GFR calc Af Amer 10 (L) >60 mL/min   Anion gap 12 5 - 15    Comment: Performed at [facility] [address] [place], [place] [address]  Protime-INR     Status: None   Collection Time: 07/10/2020 12:26 AM  Result Value Ref Range   Prothrombin Time 14.4 11.4 -  15.2 seconds   INR 1.2 0.8 - 1.2    Comment: (NOTE) INR goal varies based on device and disease states. Performed at [facility], [place] [address], [place], [place] [address]   APTT     Status: None   Collection Time: 07/03/2020 12:26 AM  Result Value Ref Range   aPTT 34 24 - 36 seconds    Comment: Performed at [facility],  [address], [place], [place] [address]  MRSA PCR Screening     Status: None   Collection Time: 07/12/2020  7:03 AM   Specimen: Nasopharyngeal  Result Value Ref Range   MRSA by PCR NEGATIVE NEGATIVE    Comment:        The GeneXpert MRSA Assay (FDA approved for NASAL specimens only), is one component of a comprehensive MRSA colonization surveillance program. It is not intended to diagnose MRSA infection nor to guide or monitor treatment for MRSA infections. Performed at [facility], [place] [address] [place], [place] [address]   Comprehensive metabolic panel     Status: Abnormal   Collection Time: 07/09/2020  6:28 AM  Result Value Ref Range   Sodium 130 (L) 135 - 145 mmol/L   Potassium 3.8 3.5 - 5.1 mmol/L   Chloride 91 (L) 98 - 111 mmol/L   CO2 23 22 - 32 mmol/L   Glucose, Bld 73 70 - 99 mg/dL    Comment: Glucose reference range applies only to samples taken after fasting for at least 8 hours.   BUN 38 (H) 8 - 23 mg/dL   Creatinine, Ser 7.12 (H) 0.44 - 1.00 mg/dL    Comment: REPEATED TO VERIFY   Calcium 8.3 (L) 8.9 - 10.3 mg/dL   Total Protein 6.2 (L) 6.5 - 8.1 g/dL   Albumin 2.3 (L) 3.5 - 5.0 g/dL   AST 92 (H) 15 - 41 U/L   ALT 63 (H) 0 - 44 U/L   Alkaline Phosphatase 147 (H) 38 - 126 U/L   Total Bilirubin 5.4 (H) 0.3 - 1.2 mg/dL   GFR calc non Af Amer 5 (L) >60 mL/min   GFR calc Af Amer 6 (L) >60 mL/min   Anion gap 16 (H) 5 - 15    Comment: Performed at [facility], [place] [address], [place], [place] [address]  CBC     Status: Abnormal   Collection Time: 06/25/2020  6:28 AM  Result Value Ref Range   WBC 4.0 4.0 - 10.5 K/uL    RBC 2.64 (L) 3.87 - 5.11 MIL/uL   Hemoglobin 7.7 (L) 12.0 - 15.0 g/dL   HCT 23.5 (L) 36 - 46 %   MCV 89.0 80.0 - 100.0 fL   MCH 29.2 26.0 - 34.0 pg   MCHC 32.8 30.0 - 36.0 g/dL   RDW 16.6 (H) 11.5 - 15.5 %   Platelets 78 (L) 150 - 400 K/uL    Comment: REPEATED TO VERIFY Immature Platelet Fraction may be clinically indicated, consider ordering this additional test [id number] CONSISTENT WITH PREVIOUS RESULT    nRBC 0.0 0.0 - 0.2 %    Comment: Performed at [facility], [place] [address] [place], [place] [address]   MR ABDOMEN MRCP WO CONTRAST  Result Date: 06/20/2020 CLINICAL DATA:  Cholelithiasis. Nausea and vomiting. Abdominal pain. EXAM: MRI ABDOMEN WITHOUT CONTRAST  (INCLUDING MRCP) TECHNIQUE: Multiplanar multisequence MR imaging of the abdomen was performed. Heavily T2-weighted images of the biliary and pancreatic ducts were obtained, and three-dimensional MRCP images were rendered by post processing. COMPARISON:  Ultrasound of 05/31/2020.  Prior MRI of 09/24/2019. FINDINGS: Mild motion degradation. Exam is also limited by lack of intravenous contrast. Lower chest: Mild cardiomegaly.  No pericardial or pleural effusion. Hepatobiliary: Mild cirrhosis. Iron deposition within the liver. Segment 8 ablation defect is subtly apparent on 13/10. Suboptimally evaluated, without gross local recurrence. No new liver lesion. The gallbladder is distended, including 11.5  cm on 17/7. Mild pericystic edema is identified. Soft tissue density within the gallbladder lumen is similar in size to on the prior exam, including at 2.9 cm on 23/6. Mild intrahepatic biliary duct dilatation. Upper normal to mild common duct dilatation, including at 9 mm on 20/7. Distal common duct stone including at 1.3 cm on 21/7. Pancreas: Grossly normal for age, without duct dilatation or acute inflammation. Spleen:  Iron deposition within. Adrenals/Urinary Tract: Normal adrenal glands. Moderate renal atrophy bilaterally. Complex  cystic lesion or lesions within the upper pole left kidney are grossly similar. Example at 4.0 x 2.9 cm on 16/6 versus 3.9 x 2.5 cm on the prior exam (when remeasured). No hydronephrosis. Stomach/Bowel: Normal stomach and bowel loops. Vascular/Lymphatic: Normal aortic caliber. No abdominal adenopathy. Other: Trace ascites adjacent to the left dominant small bowel loops, including on 25/10. Musculoskeletal: No acute osseous abnormality. IMPRESSION: 1. Decreased sensitivity and specificity exam due to technique related factors, as described above. 2. Choledocholithiasis, with a 1.3 cm distal common duct stone. 3. Gallbladder distension with mild pericholecystic edema. Findings are suspicious for acute cholecystitis. 4. Soft tissue signal within the gallbladder lumen is similar to on the prior exam. 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 5. Cirrhosis and segment 8 ablation defect, suboptimally evaluated without gross recurrent or metachronous hepatocellular carcinoma. 6. Hemosiderosis. 7. Trace abdominal ascites. 8. Similar upper pole left renal complex cystic lesion, incompletely characterized. Findings were communicated with the clinical service subsequent to a preliminary interpretation on the evening of 06/25/2020. Electronically Signed   By: Abigail Miyamoto M.D.   On: 07/03/2020 04:23   DG C-Arm 1-60 Min-No Report  Result Date: 07/08/2020 Fluoroscopy was utilized by the requesting physician.  No radiographic interpretation.   Anti-infectives (From admission, onward)   Start     Dose/Rate Route Frequency Ordered Stop   07/15/2020 0430  piperacillin-tazobactam (ZOSYN) IVPB 2.25 g     Discontinue     2.25 g 100 mL/hr over 30 Minutes Intravenous Every 8 hours 07/02/2020 2147     06/21/2020 2000  piperacillin-tazobactam (ZOSYN) IVPB 3.375 g        3.375 g 12.5 mL/hr over 240 Minutes Intravenous  Once  07/15/2020 1957 06/22/2020 2328    .    Assessment/Plan ESRD on HD T/Th/Sat Hepatocellular carcinoma (dx 02/2015 S/P thermoablation follows with Dr. Burr Medico) Alcoholic liver cirrhosis HTN HLD Thrombocytopenia - Plt 78 Acute on chronic anemia Hyponatremia/hypochloremia   Choledocholithiasis ?Acute cholecystitis  - MRCP shows choledocholithiasis, possible acute cholecystitis with gallbladder distension and mild pericholecystic edema, concern for possible gallbladder neoplasm, and cirrhosis with trace abdominal ascites - Elevated LFTs with bilirubin uptrending 5.4<<5 - S/p unsuccessful ERCP 7/18: due to distorted anatomy (very dilated gallbladder and cystic duct) no papillary orifice was seen and attempts at cholangiogram were failed; appears to be a very distal stone  ID - zosyn 7/17>> VTE - SCDs, sq heparin FEN - NPO Foley - none Follow up - TBD  Plan: Will follow up with GI regarding plans for possible repeat ERCP - Dr. Henrene Pastor to see. Continue IV antibiotics for now for possible acute cholecystitis.   [place]ellington [place], [facility] 06/27/2020, 8:55 AM Please see Amion for pager number during day hours 7:00am-4:30pm

## 2020-07-05 NOTE — Op Note (Addendum)
Preop diagnosis: Acute cholecystitis, choledocholithiasis with obstruction Postop diagnosis: Same, intraoperative hypotension Procedure performed: Attempted laparoscopic cholecystectomy with placement of percutaneous cholecystostomy tube Surgeon:Zaden Sako K Jadasia Haws Assistant: Dr. Autumn Messing Anesthesia: General ETT Indications: This is a chronically ill 74 year old female with mild cirrhosis, history of hepatocellular carcinoma status post radiofrequency ablation, end-stage renal disease on dialysis who presented with acute cholecystitis and choledocholithiasis.  She is jaundiced with a bilirubin of 5.1.  GI attempted ERCP but was unable to identify the opening of the ampulla.  After discussion, we decided to attempt laparoscopic cholecystectomy with plans of obtaining a cholangiogram intraoperatively.  After discussion with the patient and her family we decided proceed with surgery today.  Description of procedure:  The patient was seen again in the Holding Room. The risks, benefits, complications, treatment options, and expected outcomes were discussed with the patient. The possibilities of reaction to medication, pulmonary aspiration, perforation of viscus, bleeding, recurrent infection, finding a normal gallbladder, the need for additional procedures, failure to diagnose a condition, the possible need to convert to an open procedure, and creating a complication requiring transfusion or operation were discussed with the patient. The likelihood of improving the patient's symptoms with return to their baseline status is good.  The patient and/or family concurred with the proposed plan, giving informed consent. The site of surgery properly noted. The patient was taken to Operating Room, identified as Sarah Harmon and the procedure verified as Laparoscopic Cholecystectomy with Intraoperative Cholangiogram. A Time Out was held and the above information confirmed.  Prior to the induction of general  anesthesia, antibiotic prophylaxis was administered. General endotracheal anesthesia was then administered and tolerated well. After the induction, the abdomen was prepped with Chloraprep and draped in sterile fashion. The patient was positioned in the supine position.  Local anesthetic agent was injected into the skin near the umbilicus and an incision made. We dissected down to the abdominal fascia with blunt dissection.  The fascia was incised vertically and we entered the peritoneal cavity bluntly.  A pursestring suture of 0-Vicryl was placed around the fascial opening.  The Hasson cannula was inserted and secured with the stay suture.  Pneumoperitoneum was then created with CO2 and tolerated well without any adverse changes in the patient's vital signs. An 11-mm port was placed in the subxiphoid position.  Two 5-mm ports were placed in the right upper quadrant. All skin incisions were infiltrated with a local anesthetic agent before making the incision and placing the trocars.     The liver is quite firm and nodular consistent with her diagnosis of cirrhosis.  The gallbladder is massively distended and thickened with densely adherent omentum.  Medial to this large gallbladder there is another smaller distended area of unclear anatomy.  The stomach appears grossly normal but the pylorus is adherent to the liver.  We began bluntly peeling the omentum away from the fundus of the gallbladder.  Once we had cleared the fundus of the massively distended gallbladder we used a suction aspirator to decompress the gallbladder.  This allowed [place] to grasp the gallbladder and lifted to continue bluntly dissecting it free from the surrounding adhesions.  There was diffuse oozing likely secondary to her hepatic dysfunction and renal failure.  We did not cut or cauterize any major blood vessels.  We continued our dissection down towards the hilum of the gallbladder.  We could not clearly identify the cystic duct.  The  gallbladder did appear to start to narrow but then there was  the second bulbous, fluctuant area that possibly represented a duodenal diverticulum.  We were hesitant to dissect around this area until we could better define the anatomy.  We decided to begin dissecting the gallbladder free from the liver in a dome down fashion.  However as we began this dissection, the patient became profoundly hypotensive.  We released our insufflation and the patient was placed in Trendelenburg.  Anesthesia was able to establish additional access and administer additional intravascular volume.  However they recommended that we terminate the case soon as possible.  Once we were able to reinsufflate safely we brought a 61 French Foley catheter through our lateral port site.  I made a small opening in the top of the gallbladder.  We inserted the Foley catheter into the gallbladder and inflated the balloon.  The catheter was sutured to the skin and was connected to a collection bag.  A 19 [place] JP drain was brought in through our epigastric port site and brought out through the right upper quadrant medial port.  This was placed in to the area below the gallbladder.  This was also secured with a skin suture.  This was placed to bulb suction.  We then quickly released our insufflation and removed our ports.  The 2 remaining port sites were closed with staples.  Dressings were applied.  The patient was then transported to the recovery room after being extubated.  She will be fluid resuscitated and given blood transfusions.  I also discussed this with GI as well as the patient's family.  Imogene Burn. Georgette Dover, MD, [facility]  General/ Trauma Surgery   06/17/2020 4:40 PM

## 2020-07-05 NOTE — Transfer of Care (Signed)
Immediate Anesthesia Transfer of Care Note  Patient: Sarah Harmon  Procedure(s) Performed: ATTEMPTED LAPAROSCOPIC CHOLECYSTECTOMY, PLACEMENT OF DRAINS (N/A Abdomen)  Patient Location: PACU  Anesthesia Type:General  Level of Consciousness: awake  Airway & Oxygen Therapy: Patient Spontanous Breathing and Patient connected to face mask oxygen  Post-op Assessment: Report given to RN  Post vital signs: Reviewed and unstable  Last Vitals:  Vitals Value Taken Time  BP 96/53 06/26/2020 1729  Temp    Pulse 44 06/26/2020 1730  Resp 28 06/25/2020 1730  SpO2 100 % 07/01/2020 1730  Vitals shown include unvalidated device data.  Last Pain:  Vitals:   06/18/2020 1212  TempSrc: Oral  PainSc:       Patients Stated Pain Goal: 0 (50/93/26 7124)  Complications: No complications documented.

## 2020-07-05 NOTE — Progress Notes (Addendum)
Pt with ESRD on dialysis, alcoholic cirrhosis (previously well compensated), [place] ablated in 2016 who presented with abdominal pain, jaundice, and unintentional weight loss. 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 ERCP 7/18 unsuccessful due to distorted anatomy and cholecystectomy with transcystic wire placement was planned.  She is now s/p aborted laparoscopic cholecystectomy due to intraoperative hypotension, coagulopathic/ diffuse surface oozing noted intraoperatively. Abnormal anatomy. Foley placed in the gallbladder for decompression and a JP drain was placed. She had ongoing hypotension and bradycardia in PACU requiring transfusion of multiple products and initiation of pressors and has been undergoing resuscitation in the ICU with ongoing hemorrhage and shock.   She has received 7u PRBC (last one ending at 22:40), 2 FFP (last ending at 2145), 1Plt (ending 1855). 1 cryo (end 2115).  ABG 23:44= 7.249/ 32.9/243, base deficit 12 (from 15); Hgb 6.8 (from 9.5), INR 2, Fibrinogen 173, last plts 79.  She is on levo, vaso and neo at max doses T91.4, P 55, Bp 76/31 Somnolent, eyes open to voice, maintaining sats though not intubated. Moves all 4 extremities, not purposeful Abdomen is distended, slightly moreso than my last exam in PACU around 7pm, but compressible, with sanguinoserous output from JP and thinner sanguinoserous output from the gallbladder drain. There is bleeding from the port-sites and central line site.  Given intraoperative findings and events, as well as ongoing and worsened coagulopathy/ hypothermia/ acidosis, there is no benefit to returning to the OR to attempt bleeding control- this would consist of laparotomy and packing. She would not survive this at this point and given physiologic parameters, it would not be successful.  Recommend continued transfusion to correct coagulopathy/ correct  acidosis/hypothermia. 4FFP/ 2uPRBC ordered by Dr. Tamala Julian, I will add another 2 pools of cryo. Family is en route. At this point prognosis is grave.

## 2020-07-05 NOTE — Evaluation (Signed)
Physical Therapy Evaluation Patient Details Name: Sarah Harmon MRN: [id number] DOB: 03/04/1946 Today's Date: 06/27/2020   History of Present Illness  Sarah Harmon is a 74 yo female presenting with increasing abdominal pain, nausea, and vomiting. 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 PMH includes: HTN, HLD, depression, anxiety, hepatocellular carcinoma, ESRD with HD TTS, and alcoholic liver cirrhosis.  Clinical Impression  Pt in bed upon arrival of PT, agreeable to evaluation at this time. Prior to admission the pt was independent with all mobility with use of RW and independent with all ADLs. The pt now presents with minor limitations in functional mobility, endurance, power, and dynamic stability due to above dx and chronically reduced activity, and will continue to benefit from skilled PT to address these deficits. The pt was able to demo good transfers and hallway ambulation with supervision for safety, but was limited by fatigue and endurance. The pt will continue to benefit from skilled PT to improve functional mobility endurance as well as dynamic stability to facilitate return to functional independence in the home.      Follow Up Recommendations Home health PT;Supervision for mobility/OOB    Equipment Recommendations  None recommended by PT    Recommendations for Other Services       Precautions / Restrictions Precautions Precautions: Fall Restrictions Weight Bearing Restrictions: No      Mobility  Bed Mobility Overal bed mobility: Needs Assistance Bed Mobility: Supine to Sit;Sit to Supine     Supine to sit: Supervision Sit to supine: Supervision   General bed mobility comments: pt able to mobilize in bed without assist, supervision for safety.  Transfers Overall transfer level: Needs assistance Equipment used: Rolling walker (2 wheeled) Transfers: Sit to/from Stand Sit  to Stand: Min guard;Supervision         General transfer comment: minG on initial stand, supervision with following. VC for hand placement  Ambulation/Gait Ambulation/Gait assistance: Min guard Gait Distance (Feet): 150 Feet Assistive device: Rolling walker (2 wheeled) Gait Pattern/deviations: Step-through pattern;Decreased stride length;Narrow base of support Gait velocity: 0.32 m/s Gait velocity interpretation: <1.31 ft/sec, indicative of household ambulator General Gait Details: pt with slow, small steps with minimal clearance and reliance on RW. cues for upright posture and positioning in RW.      Balance Overall balance assessment: Needs assistance Sitting-balance support: Single extremity supported;Feet supported Sitting balance-Leahy Scale: Good     Standing balance support: Bilateral upper extremity supported;During functional activity Standing balance-Leahy Scale: Fair Standing balance comment: pt able to static stand without assist, BUE support for ambulation                             Pertinent Vitals/Pain Pain Assessment: 0-10 Pain Score: 8  Pain Descriptors / Indicators: Sore;Burning Pain Intervention(s): Limited activity within patient's tolerance;Monitored during session;Patient requesting pain meds-RN notified;Repositioned    Home Living Family/patient expects to be discharged to:: Private residence Living Arrangements: Children (Son who works during day) Available Help at Discharge: Family;Available PRN/intermittently Type of Home: House Home Access: Stairs to enter   Entrance Stairs-Number of Steps: 1 Home Layout: One level Home Equipment: Toilet riser;Walker - 4 wheels;Hand held shower head;Shower seat      Prior Function Level of Independence: Independent         Comments: daughter does errands, takes her to dialysis and appointments. Otherwise pt reports independence with mobility in home and ADLs  Hand Dominance    Dominant Hand: Right    Extremity/Trunk Assessment   Upper Extremity Assessment Upper Extremity Assessment: Generalized weakness    Lower Extremity Assessment Lower Extremity Assessment: Generalized weakness (globally functional, limited endurance)       Communication   Communication: No difficulties  Cognition Arousal/Alertness: Awake/alert Behavior During Therapy: WFL for tasks assessed/performed Overall Cognitive Status: Within Functional Limits for tasks assessed                                        General Comments      Exercises General Exercises - Lower Extremity Long Arc Quad: AROM;Both;5 reps;Seated Heel Slides: AROM;Both;5 reps;Seated Hip Flexion/Marching: AROM;Seated;Both;5 reps Heel Raises: AROM;Seated;Both;15 reps   Assessment/Plan    PT Assessment Patient needs continued PT services  PT Problem List Decreased strength;Decreased mobility;Decreased activity tolerance;Decreased balance       PT Treatment Interventions Gait training;Therapeutic exercise;Balance training;Stair training;Functional mobility training;Therapeutic activities;Patient/family education    PT Goals (Current goals can be found in the Care Plan section)  Acute Rehab PT Goals Patient Stated Goal: get back to walking outside PT Goal Formulation: With patient Time For Goal Achievement: 07/19/20 Potential to Achieve Goals: Good    Frequency Min 3X/week    AM-PAC PT "6 Clicks" Mobility  Outcome Measure Help needed turning from your back to your side while in a flat bed without using bedrails?: None Help needed moving from lying on your back to sitting on the side of a flat bed without using bedrails?: None Help needed moving to and from a bed to a chair (including a wheelchair)?: A Little Help needed standing up from a chair using your arms (e.g., wheelchair or bedside chair)?: A Little Help needed to walk in hospital room?: A Little Help needed climbing 3-5 steps  with a railing? : A Little 6 Click Score: 20    End of Session Equipment Utilized During Treatment: Gait belt Activity Tolerance: Patient tolerated treatment well Patient left: in bed;with bed alarm set Nurse Communication: Mobility status PT Visit Diagnosis: Difficulty in walking, not elsewhere classified (R26.2);Muscle weakness (generalized) (M62.81)    Time: 9024-0973 PT Time Calculation (min) (ACUTE ONLY): 27 min   Charges:   PT Evaluation $PT Eval Moderate Complexity: 1 Mod PT Treatments $Gait Training: 8-22 mins        Karma Ganja, PT, DPT   Acute Rehabilitation Department Pager #: [phone]   Otho Bellows 06/23/2020, 10:55 AM

## 2020-07-05 NOTE — Progress Notes (Addendum)
[facility] Progress Note  CC:  CBD stones  Subjective:  Patient seen in the hallway as she was being wheeled to surgery.    Objective:  Vital signs in last 24 hours: Temp:  [97.7 F (36.5 C)-98.2 F (36.8 C)] 98.2 F (36.8 C) (07/19 1212) Pulse Rate:  [52-66] 57 (07/19 1212) Resp:  [16-18] 16 (07/19 1212) BP: (96-139)/(41-62) 110/41 (07/19 1212) SpO2:  [97 %-100 %] 99 % (07/19 1212) Weight:  [52.4 kg] 52.4 kg (07/19 0457) Last BM Date: 06/26/2020   General:  Alert, Well-developed, in NAD  Intake/Output from previous day: 07/18 0701 - 07/19 0700 In: 85 [P.O.:240; I.V.:250; IV Piggyback:400] Out: -   Lab Results: Recent Labs    06/26/2020 1506 06/17/2020 0026 07/01/2020 0628  WBC 9.3 7.5 4.0  HGB 9.4* 8.4* 7.7*  HCT 28.8* 25.6* 23.5*  PLT 107* 99* 78*   BMET Recent Labs    06/25/2020 1506 07/17/2020 0026 07/04/2020 0628  NA 133* 129* 130*  K 3.8 3.9 3.8  CL 93* 91* 91*  CO2 27 26 23   GLUCOSE 101* 93 73  BUN 12 20 38*  CREATININE 3.53* 4.49* 7.12*  CALCIUM 8.9 8.8* 8.3*   LFT Recent Labs    07/12/2020 0628  PROT 6.2*  ALBUMIN 2.3*  AST 92*  ALT 63*  ALKPHOS 147*  BILITOT 5.4*   PT/INR Recent Labs    07/01/2020 0026  LABPROT 14.4  INR 1.2   MR ABDOMEN MRCP WO CONTRAST  Result Date: 07/15/2020 CLINICAL DATA:  Cholelithiasis. Nausea and vomiting. Abdominal pain. EXAM: MRI ABDOMEN WITHOUT CONTRAST  (INCLUDING MRCP) TECHNIQUE: Multiplanar multisequence MR imaging of the abdomen was performed. Heavily T2-weighted images of the biliary and pancreatic ducts were obtained, and three-dimensional MRCP images were rendered by post processing. COMPARISON:  Ultrasound of 05/31/2020.  Prior MRI of 09/24/2019. FINDINGS: Mild motion degradation. Exam is also limited by lack of intravenous contrast. Lower chest: Mild cardiomegaly.  No pericardial or pleural effusion. Hepatobiliary: Mild cirrhosis. Iron deposition within the liver. Segment 8 ablation defect is  subtly apparent on 13/10. Suboptimally evaluated, without gross local recurrence. No new liver lesion. The gallbladder is distended, including 11.5 cm on 17/7. Mild pericystic edema is identified. Soft tissue density within the gallbladder lumen is similar in size to on the prior exam, including at 2.9 cm on 23/6. Mild intrahepatic biliary duct dilatation. Upper normal to mild common duct dilatation, including at 9 mm on 20/7. Distal common duct stone including at 1.3 cm on 21/7. Pancreas: Grossly normal for age, without duct dilatation or acute inflammation. Spleen:  Iron deposition within. Adrenals/Urinary Tract: Normal adrenal glands. Moderate renal atrophy bilaterally. Complex cystic lesion or lesions within the upper pole left kidney are grossly similar. Example at 4.0 x 2.9 cm on 16/6 versus 3.9 x 2.5 cm on the prior exam (when remeasured). No hydronephrosis. Stomach/Bowel: Normal stomach and bowel loops. Vascular/Lymphatic: Normal aortic caliber. No abdominal adenopathy. Other: Trace ascites adjacent to the left dominant small bowel loops, including on 25/10. Musculoskeletal: No acute osseous abnormality. IMPRESSION: 1. Decreased sensitivity and specificity exam due to technique related factors, as described above. 2. Choledocholithiasis, with a 1.3 cm distal common duct stone. 3. Gallbladder distension with mild pericholecystic edema. Findings are suspicious for acute cholecystitis. 4. Soft tissue signal within the gallbladder lumen is similar to on the prior exam. 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 5.  Cirrhosis and segment 8 ablation defect, suboptimally evaluated without gross recurrent or metachronous hepatocellular carcinoma. 6. Hemosiderosis. 7. Trace abdominal ascites. 8. Similar upper pole left renal complex cystic lesion, incompletely characterized. Findings were communicated with  the clinical service subsequent to a preliminary interpretation on the evening of 06/30/2020. Electronically Signed   By: Abigail Miyamoto M.D.   On: 06/29/2020 04:23   DG C-Arm 1-60 Min-No Report  Result Date: 06/22/2020 Fluoroscopy was utilized by the requesting physician.  No radiographic interpretation.   Assessment / Plan: *    Choledocholithiasis with elevated LFTs:  ERCP attempt on 7/18 unsuccessful, could not visualize the ampulla.  *    Possible acute cholecystitis.  Soft tissue lesion in gallbladder, rule out neoplasm.  Is on Zosyn.  *     Cirrhosis of the liver, compensated. S/p RFA hepatocellular carcinoma 2016.  Follow with [facility] as well.  *    Thrombocytopenia:  From cirrhosis.  Platelets 78 today.  *    Normocytic anemia.  [place] Rast at dialysis.  Hgb trending down here to 7.7 grams this AM.  Hemoccult was ordered by primary service, but no overt sign of GI bleeding noted.  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  Patient is not on PPI or H2 blocker according to her med list and is not on any here.  *ESRD on HD TTS  -Surgery saw the patient and they are planning to proceed with lap chole with IOC and will place a guide wire percutaneously that runs through the cystic duct stump and through the ampulla into the duodenum.  This will allow to perform ERCP by following the wire into the ampulla.  The wire can then be removed.  She is for surgery today and is actually heading down there now.  ? ERCP on 7/20.   LOS: 2 days   Laban Emperor. Zehr  06/19/2020, 12:27 PM  GI ATTENDING  Interval history data reviewed.  Case discussed with both Dr. Carlean Purl and Dr. Georgette Dover.  Plans today for cholecystectomy.  IOC to rule out CBD stone (versus cystic duct stone).  If choledocholithiasis, surgery to place wire into the duodenum to assist with ERCP direct access to the common  duct.  Tentative ERCP for tomorrow morning.  Await outcome of surgery.    Docia Chuck. Geri Seminole., M.D. [facility] Division of Gastroenterology

## 2020-07-05 NOTE — Progress Notes (Signed)
Severely hypotensive post surgery.  IV fluid bolus NS and albumin given.  Started on neo vasopressors, vasopressin added for additional pressure support.  In the process of obtaining a central line.  Will transfer to ICU to continue care.  Updated daughter via phone.

## 2020-07-05 NOTE — Anesthesia Preprocedure Evaluation (Addendum)
Anesthesia Evaluation  Patient identified by MRN, date of birth, ID band Patient awake    Reviewed: Allergy & Precautions, NPO status , Patient's Chart, lab work & pertinent test results  Airway Mallampati: II  TM Distance: >3 FB Neck ROM: Full    Dental no notable dental hx.    Pulmonary neg pulmonary ROS,    Pulmonary exam normal breath sounds clear to auscultation       Cardiovascular hypertension, Normal cardiovascular exam Rhythm:Regular Rate:Normal     Neuro/Psych negative neurological ROS  negative psych ROS   GI/Hepatic negative GI ROS, (+) Cirrhosis       ,   Endo/Other  negative endocrine ROS  Renal/GU DialysisRenal disease  negative genitourinary   Musculoskeletal negative musculoskeletal ROS (+)   Abdominal   Peds negative pediatric ROS (+)  Hematology  (+) anemia ,   Anesthesia Other Findings   Reproductive/Obstetrics negative OB ROS                            Anesthesia Physical Anesthesia Plan  ASA: III  Anesthesia Plan: General   Post-op Pain Management:    Induction: Intravenous  PONV Risk Score and Plan: 3 and Ondansetron, Dexamethasone and Treatment may vary due to age or medical condition  Airway Management Planned: Oral ETT  Additional Equipment:   Intra-op Plan:   Post-operative Plan: Extubation in OR  Informed Consent: I have reviewed the patients History and Physical, chart, labs and discussed the procedure including the risks, benefits and alternatives for the proposed anesthesia with the patient or authorized representative who has indicated his/her understanding and acceptance.     Dental advisory given  Plan Discussed with: CRNA and Surgeon  Anesthesia Plan Comments:         Anesthesia Quick Evaluation

## 2020-07-05 NOTE — Anesthesia Procedure Notes (Signed)
Procedure Name: Intubation Date/Time: 06/25/2020 2:32 PM Performed by: Barrington Ellison, CRNA Pre-anesthesia Checklist: Patient identified, Emergency Drugs available, Suction available and Patient being monitored Patient Re-evaluated:Patient Re-evaluated prior to induction Oxygen Delivery Method: Circle System Utilized Preoxygenation: Pre-oxygenation with 100% oxygen Induction Type: IV induction and Rapid sequence Ventilation: Mask ventilation without difficulty Laryngoscope Size: Mac and 3 Grade View: Grade I Tube type: Oral Tube size: 7.0 mm Number of attempts: 1 Airway Equipment and Method: Stylet and Oral airway Placement Confirmation: ETT inserted through vocal cords under direct vision,  positive ETCO2 and breath sounds checked- equal and bilateral Secured at: 21 cm Tube secured with: Tape Dental Injury: Teeth and Oropharynx as per pre-operative assessment

## 2020-07-05 NOTE — Progress Notes (Signed)
PROGRESS NOTE  XIADANI DAMMAN DXA:[id number] DOB: [birth year] DOA: 07/17/2020 PCP: Biagio Borg, MD  HPI/Recap of past 81 hours: 74 year old female with past medical history of hypertension, hyperlipidemia, depression, anxiety, hepatocellular carcinoma (dx 02/2015 S/P thermoablation follows with Dr. Burr Medico), ESRD (Tues, Thurs, Sat), alcoholic liver cirrhosis who presents to [facility] emergency department complaints of severe abdominal pain.   Upon evaluation at [facility] emergency department, MRCP was performed revealing evidence of both choledocholithiasis and cholecystitis.  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  Unsuccessful ERCP on 07/16/2020.  Case was also discussed with Dr. Gershon Crane with general surgery who said that general surgery does not need to be involved yet at this time until at least Monday. Continue Intravenous Zosyn as recommended by GI.    07/08/2020: Seen and examined at her bedside this morning.  She denies any abdominal pain or nausea.  Seen by surgery with plan for lap chole with IOC and will place a guide wire percutaneously that runs through the cystic duct stump and through the ampulla into the duodenum.  Per GI this will allow to perform ERCP by following the wire into the ampulla.  The wire can then be removed.  Possible ERCP on 06/20/2020.  Appreciate specialists assistance, GI and surgery.    Assessment/Plan: Principal Problem:   Choledocholithiasis with acute cholecystitis Active Problems:   Essential hypertension   Alcoholic cirrhosis of liver without ascites (HCC)   Hepatocellular carcinoma (HCC)   ESRD (end stage renal disease) ([place])   Anemia due to end stage renal disease (HCC)   Gallbladder mass  Choledocholithiasis with acute cholecystitis, plan for lap chole today and IOC  Patient presenting with severe abdominal pain with  initial work-up revealing multiple deranged hepatic function indices including markedly elevated alkaline phosphatase, AST and ALT as well as total bilirubin of 5.0.  T bili uptrending, 5.4  Patient is found to have choledocholithiasis as well as evidence of acute cholecystitis on MRCP  Acute cholecystitis is likely secondary to concurrent gallbladder mass identified on MRCP.  Intravenous Zosyn has been initiated which is continued  Continue to follow blood cultures  Unsuccessful ERCP on 06/17/2020.    Plan for lap chole on 07/01/2020 and IOC.    Possible ERCP on 07/03/2020.    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  Hyponatremia in the setting of ESRD Sodium 129 Electrolytes and volume status addressed with hemodialysis  ESRD on HD TTS Nephrology consulted for hemodialysis   Essential hypertension, currently hypotensive Blood pressure is currently soft Propranolol dose reduced and on hold due to soft blood pressures to avoid hypotension Continue to closely monitor vital signs. Maintain MAP greater than 65    Anemia of chronic disease due to end stage renal disease on hemodialysis  No clinical evidence of bleeding  Hemoglobin 8.4> 7.7 unclear if hemodilutional prior to hemodialysis superimposed by acute illness in the setting of anemia of chronic disease  Continue to monitor H&H, particularly after hemodialysis    Alcoholic cirrhosis of liver without ascites Continue supportive care Avoid hepatotoxic agents    Hepatocellular carcinoma   Patient follows with Dr. Burr Medico as an outpatient.   Code Status:  Full code Family Communication:  No evidence of.  Status is: Inpatient  Remains inpatient appropriate because:Ongoing diagnostic testing needed not appropriate for outpatient work up, IV treatments appropriate due to intensity of illness  or inability to take PO and Inpatient level of care appropriate due to severity of  illness   Dispo: The patient is from: Home  Anticipated d/c is to: Home  Anticipated d/c date is: 07/07/2020  Patient currently is not medically stable to d/c due to ongoing work-up for gallbladder mass.          Objective: Vitals:   07/16/2020 0017 07/07/2020 0457 07/12/2020 0843 06/30/2020 1212  BP: (!) 96/46 (!) 104/49 (!) 99/48 (!) 110/41  Pulse: (!) 52 (!) 56 (!) 57 (!) 57  Resp: 18 18  16   Temp: 97.9 F (36.6 C) 97.7 F (36.5 C)  98.2 F (36.8 C)  TempSrc: Oral Oral  Oral  SpO2: 100% 100%  99%  Weight:  52.4 kg      Intake/Output Summary (Last 24 hours) at 06/24/2020 1341 Last data filed at 07/14/2020 7867 Gross per 24 hour  Intake 390 ml  Output --  Net 390 ml   Filed Weights   07/04/2020 0457  Weight: 52.4 kg    Exam:  . General: 74 y.o. year-old female well-developed well-nourished no acute stress.  Alert and oriented x3. . Cardiovascular: Regular rate and rhythm no rubs or gallops.   Marland Kitchen Respiratory: Clear to auscultation no wheezes or rales. . Abdomen: Soft nontender normal bowel sounds present. . Musculoskeletal: No lower extremity edema bilaterally.   Marland Kitchen Psychiatry: Mood is appropriate for condition and setting.  Data Reviewed: CBC: Recent Labs  Lab 07/12/2020 1506 07/03/2020 0026 06/26/2020 0628  WBC 9.3 7.5 4.0  NEUTROABS  --  6.0  --   HGB 9.4* 8.4* 7.7*  HCT 28.8* 25.6* 23.5*  MCV 89.2 91.1 89.0  PLT 107* 99* 78*   Basic Metabolic Panel: Recent Labs  Lab 06/21/2020 1506 06/23/2020 0026 07/03/2020 0628  NA 133* 129* 130*  K 3.8 3.9 3.8  CL 93* 91* 91*  CO2 27 26 23   GLUCOSE 101* 93 73  BUN 12 20 38*  CREATININE 3.53* 4.49* 7.12*  CALCIUM 8.9 8.8* 8.3*  MG  --  1.8  --    GFR: Estimated Creatinine Clearance: 5.6 mL/min (A) (by C-G formula based on SCr of 7.12 mg/dL (H)). Liver Function Tests: Recent Labs  Lab 06/20/2020 1506 07/08/2020 0026 07/05/20 0628  AST 169* 132* 92*  ALT 116* 93* 63*  ALKPHOS  147* 141* 147*  BILITOT 5.0* 5.1* 5.4*  PROT 7.3 6.7 6.2*  ALBUMIN 2.9* 2.5* 2.3*   Recent Labs  Lab 06/30/2020 1506  LIPASE 34   No results for input(s): AMMONIA in the last 168 hours. Coagulation Profile: Recent Labs  Lab 07/10/2020 0026  INR 1.2   Cardiac Enzymes: No results for input(s): CKTOTAL, CKMB, CKMBINDEX, TROPONINI in the last 168 hours. BNP (last 3 results) No results for input(s): PROBNP in the last 8760 hours. HbA1C: No results for input(s): HGBA1C in the last 72 hours. CBG: No results for input(s): GLUCAP in the last 168 hours. Lipid Profile: No results for input(s): CHOL, HDL, LDLCALC, TRIG, CHOLHDL, LDLDIRECT in the last 72 hours. Thyroid Function Tests: No results for input(s): TSH, T4TOTAL, FREET4, T3FREE, THYROIDAB in the last 72 hours. Anemia Panel: No results for input(s): VITAMINB12, FOLATE, FERRITIN, TIBC, IRON, RETICCTPCT in the last 72 hours. Urine analysis:    Component Value Date/Time   COLORURINE AMBER (A) 07/14/2018 0104   APPEARANCEUR HAZY (A) 07/14/2018 0104   LABSPEC 1.019 07/14/2018 0104   PHURINE 5.0 07/14/2018 0104   GLUCOSEU NEGATIVE 07/14/2018 0104  GLUCOSEU NEGATIVE 09/04/2017 0943   HGBUR NEGATIVE 07/14/2018 0104   BILIRUBINUR NEGATIVE 07/14/2018 0104   KETONESUR NEGATIVE 07/14/2018 0104   PROTEINUR >=300 (A) 07/14/2018 0104   UROBILINOGEN 0.2 09/04/2017 0943   NITRITE NEGATIVE 07/14/2018 0104   LEUKOCYTESUR NEGATIVE 07/14/2018 0104   Sepsis Labs: @LABRCNTIP (procalcitonin:4,lacticidven:4)  ) Recent Results (from the past 240 hour(s))  SARS Coronavirus 2 by RT PCR (hospital order, performed in [facility] hospital lab) Nasopharyngeal Nasopharyngeal Swab     Status: None   Collection Time: 06/30/2020 10:14 PM   Specimen: Nasopharyngeal Swab  Result Value Ref Range Status   SARS Coronavirus 2 NEGATIVE NEGATIVE Final    Comment: (NOTE) SARS-CoV-2 target nucleic acids are NOT DETECTED.  The SARS-CoV-2 RNA is generally  detectable in upper and lower respiratory specimens during the acute phase of infection. The lowest concentration of SARS-CoV-2 viral copies this assay can detect is 250 copies / mL. A negative result does not preclude SARS-CoV-2 infection and should not be used as the sole basis for treatment or other patient management decisions.  A negative result may occur with improper specimen collection / handling, submission of specimen other than nasopharyngeal swab, presence of viral mutation(s) within the areas targeted by this assay, and inadequate number of viral copies (<250 copies / mL). A negative result must be combined with clinical observations, patient history, and epidemiological information.  Fact Sheet for Patients:   StrictlyIdeas.no  Fact Sheet for Healthcare Providers: BankingDealers.co.za  This test is not yet approved or  cleared by the [place] FDA and has been authorized for detection and/or diagnosis of SARS-CoV-2 by FDA under an Emergency Use Authorization (EUA).  This EUA will remain in effect (meaning this test can be used) for the duration of the COVID-19 declaration under Section 564(b)(1) of the Act, 21 U.S.C. section 360bbb-3(b)(1), unless the authorization is terminated or revoked sooner.  Performed at [facility], [place] [address] [place], [place] [address]   MRSA PCR Screening     Status: None   Collection Time: 07/03/2020  7:03 AM   Specimen: Nasopharyngeal  Result Value Ref Range Status   MRSA by PCR NEGATIVE NEGATIVE Final    Comment:        The GeneXpert MRSA Assay (FDA approved for NASAL specimens only), is one component of a comprehensive MRSA colonization surveillance program. It is not intended to diagnose MRSA infection nor to guide or monitor treatment for MRSA infections. Performed at [facility], [place] [address] [place], [place] [address]       Studies: No results  found.  Scheduled Meds: . [MAR Hold] ALPRAZolam  0.5 mg Oral QHS  . chlorhexidine  15 mL Mouth/Throat Once  . [MAR Hold] doxercalciferol  7 mcg Intravenous Q T,Th,Sa-HD  . [MAR Hold] ketotifen  1 drop Both Eyes BID  . [MAR Hold] lactulose  20 g Oral Daily  . [MAR Hold] multivitamin  1 tablet Oral QHS  . [MAR Hold] polyvinyl alcohol  1 drop Both Eyes Daily  . [MAR Hold] propranolol  20 mg Oral BID    Continuous Infusions: . sodium chloride    . [MAR Hold] piperacillin-tazobactam (ZOSYN)  IV 2.25 g (07/04/2020 1208)     LOS: 2 days     Kayleen Memos, MD [facility] Pager [phone]  If 7PM-7AM, please contact night-coverage [email or web address] Password [facility] 06/26/2020, 1:41 PM

## 2020-07-05 NOTE — Progress Notes (Signed)
Patient has put out 425 of bright red blood out of her jp drain with in the last hour. Abdomen is noticeably more distended, 3/4 of her incision sites on her abdomen are leaking blood and her central line dressing is saturated in blood. MD made aware, and dressings changed. Will continue to monitor.

## 2020-07-05 NOTE — Consult Note (Signed)
[facility] Surgery Consult Note  Sarah Harmon [birth year]  [id number].    Requesting MD: Inda Merlin Chief Complaint/Reason for Consult: choledocholithiasis  HPI:  Sarah Harmon is a 74yo female PMH ESRD on HD T/Th/Sat, hepatocellular carcinoma (dx 02/2015 S/P thermoablation follows with Dr. Burr Medico), alcoholic liver cirrhosis, HTN, HLD, who was admitted to [facility] 7/17 with choledocholithiasis and possible acute cholecystitis. Patient states that last Wednesday she developed diffuse abdominal pain after eating breakfast, but it resolved after short period of time without intervention. She had never had pain like this before. On Saturday the pain returned and was more severe. States that pain is diffuse but is more severe in her upper abdomen. Admits to nausea, no emesis. Describes her abdomen as feeling feverish. She does think that the pain is worse with PO intake. Prior to last week she does reporting noticing a decline in her abdomen and unintentional weight loss of unknown amount.  ED work up included MRCP which shows choledocholithiasis, possible acute cholecystitis with gallbladder distension and mild pericholecystic edema, concern for possible gallbladder neoplasm, and cirrhosis with trace abdominal ascites. Initial labs WBC 9.3, AST 169, ALT 116, AP 147, Tbili 5, lipase 34. Gastroenterology was consulted and took the patient for ERCP yesterday. Due to her distorted anatomy (very dilated gallbladder and cystic duct) no papillary orifice was seen and attempts at cholangiogram were failed; apeears to be a very distal stone. Per their note they will regroup regarding endoscopic removal of CBD stones, will have colleagues review though not sure our advanced interventional endoscopist will be available to do a procedure. General surgery asked to see.  Abdominal surgical history: hysterectomy Anticoagulants: none Nonsmoker Denies alcohol use x5 years, never a heavy drinker, reports  drinking 6-pack of beer weekly Lives at home with her son Uses walker when out of her home for ambulation  Review of Systems  Constitutional: Positive for weight loss. Negative for chills and fever.  Respiratory: Negative.   Cardiovascular: Negative.   Gastrointestinal: Positive for abdominal pain and nausea. Negative for constipation, diarrhea and vomiting.       Anorexia  Skin: Negative.    All systems reviewed and otherwise negative except for as above  Family History  Problem Relation Age of Onset  . Lung cancer Brother   . Breast cancer Other   . Breast cancer Sister   . Gastric cancer Sister   . Lung cancer Sister   . Colon cancer Neg Hx   . Colon polyps Neg Hx   . Rectal cancer Neg Hx   . Stomach cancer Neg Hx   . Esophageal cancer Neg Hx     Past Medical History:  Diagnosis Date  . ALCOHOL ABUSE 07/13/2010   Quit drinking 2016  . Depression with anxiety 12/22/2009  . 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 DJD (degenerative joint disease), cervical    patient denies on preop of 05/10/15   . ESRD (end stage renal disease) on dialysis ([place])    HD TTS  . GALLSTONES 12/22/2009  . Headache    occasional   . Hepatocellular carcinoma ([place]) 2016   04/2015 RFA.  Marland Kitchen HYPERLIPIDEMIA 12/22/2009  . HYPERTENSION 12/22/2009  . HYPONATREMIA 12/22/2009  . 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 OSTEOPENIA 12/22/2009  . Pancytopenia 07/13/2010  . RENAL CYST, LEFT 12/22/2009  . VITAMIN D DEFICIENCY 12/23/2010  . WEIGHT LOSS 12/23/2010    Past Surgical History:  Procedure Laterality Date  . A/V FISTULAGRAM N/A 10/25/2018   Procedure:  A/V FISTULAGRAM;  Surgeon: Elam Dutch, MD;  Location: [facility];  Service: Cardiovascular;  Laterality: N/A;  . ABDOMINAL HYSTERECTOMY    . APPENDECTOMY    . AV FISTULA PLACEMENT Left 05/21/2018   Procedure: INSERTION OF ARTERIOVENOUS (AV) GORE-TEX GRAFT UPPER ARM;  Surgeon: Elam Dutch, MD;  Location: [facility] OR;  Service: Vascular;   Laterality: Left;  . AV FISTULA PLACEMENT Left 06/03/2018   Procedure: INSERTION OF ARTERIOVENOUS (AV) GORE-TEX 56mm x 10cm GRAFT INTO LEFT ARM;  Surgeon: Elam Dutch, MD;  Location: [facility];  Service: Vascular;  Laterality: Left;  . COLONOSCOPY    . ESOPHAGOGASTRODUODENOSCOPY    . IR GENERIC HISTORICAL  10/18/2016   IR RADIOLOGIST EVAL & MGMT 10/18/2016 [name], MD [facility] INTERV RAD  . IR RADIOLOGIST EVAL & MGMT  05/30/2017  . IR RADIOLOGIST EVAL & MGMT  08/28/2018  . IR RADIOLOGIST EVAL & MGMT  10/08/2019  . IR RADIOLOGIST EVAL & MGMT  10/30/2019  . surgery for liver ca    . THROMBECTOMY AND REVISION OF ARTERIOVENTOUS (AV) GORETEX  GRAFT Left 06/03/2018   Procedure: THROMBECTOMY AND REVISION OF ARTERIOVENTOUS (AV) GORETEX  GRAFT ARM;  Surgeon: Elam Dutch, MD;  Location: [facility] OR;  Service: Vascular;  Laterality: Left;    Social History:  reports that she has never smoked. She has never used smokeless tobacco. She reports previous alcohol use of about 3.0 - 4.0 standard drinks of alcohol per week. She reports that she does not use drugs.  Allergies: No Known Allergies  Medications Prior to Admission  Medication Sig Dispense Refill  . Acetaminophen (TYLENOL PO) Take 1 tablet by mouth 3 (three) times a week.    . ALPRAZolam (XANAX) 0.5 MG tablet TAKE 1 TABLET BY MOUTH ONCE DAILY AT BEDTIME (Patient taking differently: Take 0.5 mg by mouth at bedtime. ) 30 tablet 5  . amLODipine (NORVASC) 10 MG tablet Take 1 tablet by mouth once daily (Patient taking differently: Take 10 mg by mouth daily. ) 90 tablet 0  . B Complex-C-Zn-Folic Acid (DIALYVITE 935 WITH ZINC) 0.8 MG TABS Take 1 tablet by mouth daily.  12  . CONSTULOSE 10 GM/15ML solution TAKE 30 ML BY MOUTH ONCE DAILY (Patient taking differently: Take 20 g by mouth daily. ) 701 mL 0  . folic acid (FOLVITE) 1 MG tablet Take 1 tablet by mouth once daily (Patient taking differently: Take 1 mg by mouth daily. ) 90 tablet 2  . hydrALAZINE  (APRESOLINE) 100 MG tablet TAKE 1 TABLET BY MOUTH THREE TIMES DAILY (Patient taking differently: Take 100 mg by mouth 3 (three) times daily. ) 270 tablet 2  . ketotifen (ZADITOR) 0.025 % ophthalmic solution Place 1 drop into both eyes 2 (two) times daily.    Marland Kitchen lidocaine-prilocaine (EMLA) cream Apply 1 application topically daily as needed (use before dialysis).   12  . propranolol (INDERAL) 40 MG tablet Take 1 tablet by mouth twice daily (Patient taking differently: Take 40 mg by mouth 2 (two) times daily. ) 180 tablet 1  . Propylene Glycol (SYSTANE BALANCE) 0.6 % SOLN Place 1 drop into both eyes daily.    . Darbepoetin Alfa (ARANESP) 100 MCG/0.5ML SOSY injection Inject 0.5 mLs (100 mcg total) into the vein every Thursday with hemodialysis. 4.2 mL   . Methoxy PEG-Epoetin Beta (MIRCERA IJ) Mircera      Prior to Admission medications   Medication Sig Start Date End Date Taking? Authorizing Provider  Acetaminophen (TYLENOL  PO) Take 1 tablet by mouth 3 (three) times a week.   Yes [provider]  ALPRAZolam (XANAX) 0.5 MG tablet TAKE 1 TABLET BY MOUTH ONCE DAILY AT BEDTIME Patient taking differently: Take 0.5 mg by mouth at bedtime.  06/23/20  Yes Biagio Borg, MD  amLODipine (NORVASC) 10 MG tablet Take 1 tablet by mouth once daily Patient taking differently: Take 10 mg by mouth daily.  06/04/20  Yes Biagio Borg, MD  B Complex-C-Zn-Folic Acid (DIALYVITE 250 WITH ZINC) 0.8 MG TABS Take 1 tablet by mouth daily. 09/29/18  Yes [provider]  CONSTULOSE 10 GM/15ML solution TAKE 30 ML BY MOUTH ONCE DAILY Patient taking differently: Take 20 g by mouth daily.  05/19/20  Yes Biagio Borg, MD  folic acid (FOLVITE) 1 MG tablet Take 1 tablet by mouth once daily Patient taking differently: Take 1 mg by mouth daily.  01/26/20  Yes Biagio Borg, MD  hydrALAZINE (APRESOLINE) 100 MG tablet TAKE 1 TABLET BY MOUTH THREE TIMES DAILY Patient taking differently: Take 100 mg by mouth 3 (three) times  daily.  01/19/20  Yes Biagio Borg, MD  ketotifen (ZADITOR) 0.025 % ophthalmic solution Place 1 drop into both eyes 2 (two) times daily.   Yes [provider]  lidocaine-prilocaine (EMLA) cream Apply 1 application topically daily as needed (use before dialysis).  10/11/18  Yes [provider]  propranolol (INDERAL) 40 MG tablet Take 1 tablet by mouth twice daily Patient taking differently: Take 40 mg by mouth 2 (two) times daily.  04/06/20  Yes Biagio Borg, MD  Propylene Glycol (SYSTANE BALANCE) 0.6 % SOLN Place 1 drop into both eyes daily.   Yes [provider]  Darbepoetin Alfa (ARANESP) 100 MCG/0.5ML SOSY injection Inject 0.5 mLs (100 mcg total) into the vein every Thursday with hemodialysis. 07/25/18   Cherene Altes, MD  Methoxy PEG-Epoetin Beta (MIRCERA IJ) Mircera 04/06/20 04/05/21  [provider]    Blood pressure (!) 99/48, pulse (!) 57, temperature 97.7 F (36.5 C), temperature source Oral, resp. rate 18, weight 52.4 kg, SpO2 100 %. Physical Exam: General: pleasant, chronically ill appearing black female who is laying in bed in NAD HEENT: head is normocephalic, atraumatic.  Sclera are noninjected.  Pupils equal and round.  Ears and nose without any masses or lesions.  Mouth is pink and moist. Edentate Heart: regular, rate, and rhythm.  ?murmur. Palpable pedal pulses bilaterally  Lungs: CTAB, no wheezes, rhonchi, or rales noted.  Respiratory effort nonlabored Abd: soft, ND, +BS, no masses, hernias, or organomegaly. Mild diffuse tenderness with more moderate TTP in the upper quadrants R>L MS: no BUE/BLE edema, calves soft and nontender Skin: warm and dry with no masses, lesions, or rashes Psych: A&Ox4 with an appropriate affect Neuro: cranial nerves grossly intact, equal strength in BUE/BLE bilaterally, normal speech, thought process intact  Results for orders placed or performed during the hospital encounter of 07/02/2020 (from the past 48 hour(s))   Lipase, blood     Status: None   Collection Time: 06/18/2020  3:06 PM  Result Value Ref Range   Lipase 34 11 - 51 U/L    Comment: Performed at [facility] [place] [facility], [place] [address] [place], [place] [address]  Comprehensive metabolic panel     Status: Abnormal   Collection Time: 06/19/2020  3:06 PM  Result Value Ref Range   Sodium 133 (L) 135 - 145 mmol/L   Potassium 3.8 3.5 - 5.1 mmol/L  Chloride 93 (L) 98 - 111 mmol/L   CO2 27 22 - 32 mmol/L   Glucose, Bld 101 (H) 70 - 99 mg/dL    Comment: Glucose reference range applies only to samples taken after fasting for at least 8 hours.   BUN 12 8 - 23 mg/dL   Creatinine, Ser 3.53 (H) 0.44 - 1.00 mg/dL   Calcium 8.9 8.9 - 10.3 mg/dL   Total Protein 7.3 6.5 - 8.1 g/dL   Albumin 2.9 (L) 3.5 - 5.0 g/dL   AST 169 (H) 15 - 41 U/L   ALT 116 (H) 0 - 44 U/L   Alkaline Phosphatase 147 (H) 38 - 126 U/L   Total Bilirubin 5.0 (H) 0.3 - 1.2 mg/dL   GFR calc non Af Amer 12 (L) >60 mL/min   GFR calc Af Amer 14 (L) >60 mL/min   Anion gap 13 5 - 15    Comment: Performed at [facility] [address], [place], [place] [address]  CBC     Status: Abnormal   Collection Time: 07/15/2020  3:06 PM  Result Value Ref Range   WBC 9.3 4.0 - 10.5 K/uL   RBC 3.23 (L) 3.87 - 5.11 MIL/uL   Hemoglobin 9.4 (L) 12.0 - 15.0 g/dL   HCT 28.8 (L) 36 - 46 %   MCV 89.2 80.0 - 100.0 fL   MCH 29.1 26.0 - 34.0 pg   MCHC 32.6 30.0 - 36.0 g/dL   RDW 16.5 (H) 11.5 - 15.5 %   Platelets 107 (L) 150 - 400 K/uL    Comment: SPECIMEN CHECKED FOR CLOTS Immature Platelet Fraction may be clinically indicated, consider ordering this additional test [id number] SPECIMEN CHECKED FOR CLOTS PLATELET COUNT CONFIRMED BY SMEAR    nRBC 0.0 0.0 - 0.2 %    Comment: Performed at [facility], [place] [address], [place], [place] [address]  SARS Coronavirus 2 by RT PCR (hospital order, performed in [facility] hospital lab) Nasopharyngeal Nasopharyngeal Swab     Status: None   Collection  Time: 06/29/2020 10:14 PM   Specimen: Nasopharyngeal Swab  Result Value Ref Range   SARS Coronavirus 2 NEGATIVE NEGATIVE    Comment: (NOTE) SARS-CoV-2 target nucleic acids are NOT DETECTED.  The SARS-CoV-2 RNA is generally detectable in upper and lower respiratory specimens during the acute phase of infection. The lowest concentration of SARS-CoV-2 viral copies this assay can detect is 250 copies / mL. A negative result does not preclude SARS-CoV-2 infection and should not be used as the sole basis for treatment or other patient management decisions.  A negative result may occur with improper specimen collection / handling, submission of specimen other than nasopharyngeal swab, presence of viral mutation(s) within the areas targeted by this assay, and inadequate number of viral copies (<250 copies / mL). A negative result must be combined with clinical observations, patient history, and epidemiological information.  Fact Sheet for Patients:   StrictlyIdeas.no  Fact Sheet for Healthcare Providers: BankingDealers.co.za  This test is not yet approved or  cleared by the [place] FDA and has been authorized for detection and/or diagnosis of SARS-CoV-2 by FDA under an Emergency Use Authorization (EUA).  This EUA will remain in effect (meaning this test can be used) for the duration of the COVID-19 declaration under Section 564(b)(1) of the Act, 21 U.S.C. section 360bbb-3(b)(1), unless the authorization is terminated or revoked sooner.  Performed at [facility], [place] [address] [place], [place] [address]  Magnesium     Status: None   Collection Time: 07/08/2020 12:26 AM  Result Value Ref Range   Magnesium 1.8 1.7 - 2.4 mg/dL    Comment: Performed at [facility], [place] [address], [place], [place] [address]  CBC WITH DIFFERENTIAL     Status: Abnormal   Collection Time: 06/23/2020 12:26 AM  Result Value Ref Range   WBC 7.5  4.0 - 10.5 K/uL   RBC 2.81 (L) 3.87 - 5.11 MIL/uL   Hemoglobin 8.4 (L) 12.0 - 15.0 g/dL   HCT 25.6 (L) 36 - 46 %   MCV 91.1 80.0 - 100.0 fL   MCH 29.9 26.0 - 34.0 pg   MCHC 32.8 30.0 - 36.0 g/dL   RDW 16.8 (H) 11.5 - 15.5 %   Platelets 99 (L) 150 - 400 K/uL    Comment: Immature Platelet Fraction may be clinically indicated, consider ordering this additional test [id number] CONSISTENT WITH PREVIOUS RESULT    nRBC 0.0 0.0 - 0.2 %   Neutrophils Relative % 80 %   Neutro Abs 6.0 1.7 - 7.7 K/uL   Lymphocytes Relative 7 %   Lymphs Abs 0.5 (L) 0.7 - 4.0 K/uL   Monocytes Relative 12 %   Monocytes Absolute 0.9 0 - 1 K/uL   Eosinophils Relative 0 %   Eosinophils Absolute 0.0 0 - 0 K/uL   Basophils Relative 0 %   Basophils Absolute 0.0 0 - 0 K/uL   Immature Granulocytes 1 %   Abs Immature Granulocytes 0.04 0.00 - 0.07 K/uL    Comment: Performed at [facility], [place] [address], [place], [place] [address]  Comprehensive metabolic panel     Status: Abnormal   Collection Time: 07/03/2020 12:26 AM  Result Value Ref Range   Sodium 129 (L) 135 - 145 mmol/L   Potassium 3.9 3.5 - 5.1 mmol/L   Chloride 91 (L) 98 - 111 mmol/L   CO2 26 22 - 32 mmol/L   Glucose, Bld 93 70 - 99 mg/dL    Comment: Glucose reference range applies only to samples taken after fasting for at least 8 hours.   BUN 20 8 - 23 mg/dL   Creatinine, Ser 4.49 (H) 0.44 - 1.00 mg/dL   Calcium 8.8 (L) 8.9 - 10.3 mg/dL   Total Protein 6.7 6.5 - 8.1 g/dL   Albumin 2.5 (L) 3.5 - 5.0 g/dL   AST 132 (H) 15 - 41 U/L   ALT 93 (H) 0 - 44 U/L   Alkaline Phosphatase 141 (H) 38 - 126 U/L   Total Bilirubin 5.1 (H) 0.3 - 1.2 mg/dL   GFR calc non Af Amer 9 (L) >60 mL/min   GFR calc Af Amer 10 (L) >60 mL/min   Anion gap 12 5 - 15    Comment: Performed at [facility] [address], [place], [place] [address]  Protime-INR     Status: None   Collection Time: 07/12/2020 12:26 AM  Result Value Ref Range   Prothrombin Time 14.4 11.4 -  15.2 seconds   INR 1.2 0.8 - 1.2    Comment: (NOTE) INR goal varies based on device and disease states. Performed at [facility], [place] [address] [place],  [address]   APTT     Status: None   Collection Time: 07/15/2020 12:26 AM  Result Value Ref Range   aPTT 34 24 - 36 seconds    Comment: Performed at [facility],  [address] [place], [place] [address]  MRSA PCR Screening     Status: None   Collection Time: 06/28/2020  7:03 AM   Specimen: Nasopharyngeal  Result Value Ref Range   MRSA by PCR NEGATIVE NEGATIVE    Comment:        The GeneXpert MRSA Assay (FDA approved for NASAL specimens only), is one component of a comprehensive MRSA colonization surveillance program. It is not intended to diagnose MRSA infection nor to guide or monitor treatment for MRSA infections. Performed at [facility], [place] [address] [place], [place] [address]   Comprehensive metabolic panel     Status: Abnormal   Collection Time: 06/19/2020  6:28 AM  Result Value Ref Range   Sodium 130 (L) 135 - 145 mmol/L   Potassium 3.8 3.5 - 5.1 mmol/L   Chloride 91 (L) 98 - 111 mmol/L   CO2 23 22 - 32 mmol/L   Glucose, Bld 73 70 - 99 mg/dL    Comment: Glucose reference range applies only to samples taken after fasting for at least 8 hours.   BUN 38 (H) 8 - 23 mg/dL   Creatinine, Ser 7.12 (H) 0.44 - 1.00 mg/dL    Comment: REPEATED TO VERIFY   Calcium 8.3 (L) 8.9 - 10.3 mg/dL   Total Protein 6.2 (L) 6.5 - 8.1 g/dL   Albumin 2.3 (L) 3.5 - 5.0 g/dL   AST 92 (H) 15 - 41 U/L   ALT 63 (H) 0 - 44 U/L   Alkaline Phosphatase 147 (H) 38 - 126 U/L   Total Bilirubin 5.4 (H) 0.3 - 1.2 mg/dL   GFR calc non Af Amer 5 (L) >60 mL/min   GFR calc Af Amer 6 (L) >60 mL/min   Anion gap 16 (H) 5 - 15    Comment: Performed at [facility], [place] [address], [place], [place] [address]  CBC     Status: Abnormal   Collection Time: 07/12/2020  6:28 AM  Result Value Ref Range   WBC 4.0 4.0 - 10.5 K/uL    RBC 2.64 (L) 3.87 - 5.11 MIL/uL   Hemoglobin 7.7 (L) 12.0 - 15.0 g/dL   HCT 23.5 (L) 36 - 46 %   MCV 89.0 80.0 - 100.0 fL   MCH 29.2 26.0 - 34.0 pg   MCHC 32.8 30.0 - 36.0 g/dL   RDW 16.6 (H) 11.5 - 15.5 %   Platelets 78 (L) 150 - 400 K/uL    Comment: REPEATED TO VERIFY Immature Platelet Fraction may be clinically indicated, consider ordering this additional test [id number] CONSISTENT WITH PREVIOUS RESULT    nRBC 0.0 0.0 - 0.2 %    Comment: Performed at [facility], [place] [address], [place], [place] [address]   MR ABDOMEN MRCP WO CONTRAST  Result Date: 06/20/2020 CLINICAL DATA:  Cholelithiasis. Nausea and vomiting. Abdominal pain. EXAM: MRI ABDOMEN WITHOUT CONTRAST  (INCLUDING MRCP) TECHNIQUE: Multiplanar multisequence MR imaging of the abdomen was performed. Heavily T2-weighted images of the biliary and pancreatic ducts were obtained, and three-dimensional MRCP images were rendered by post processing. COMPARISON:  Ultrasound of 05/31/2020.  Prior MRI of 09/24/2019. FINDINGS: Mild motion degradation. Exam is also limited by lack of intravenous contrast. Lower chest: Mild cardiomegaly.  No pericardial or pleural effusion. Hepatobiliary: Mild cirrhosis. Iron deposition within the liver. Segment 8 ablation defect is subtly apparent on 13/10. Suboptimally evaluated, without gross local recurrence. No new liver lesion. The gallbladder is distended, including 11.5  cm on 17/7. Mild pericystic edema is identified. Soft tissue density within the gallbladder lumen is similar in size to on the prior exam, including at 2.9 cm on 23/6. Mild intrahepatic biliary duct dilatation. Upper normal to mild common duct dilatation, including at 9 mm on 20/7. Distal common duct stone including at 1.3 cm on 21/7. Pancreas: Grossly normal for age, without duct dilatation or acute inflammation. Spleen:  Iron deposition within. Adrenals/Urinary Tract: Normal adrenal glands. Moderate renal atrophy bilaterally. Complex  cystic lesion or lesions within the upper pole left kidney are grossly similar. Example at 4.0 x 2.9 cm on 16/6 versus 3.9 x 2.5 cm on the prior exam (when remeasured). No hydronephrosis. Stomach/Bowel: Normal stomach and bowel loops. Vascular/Lymphatic: Normal aortic caliber. No abdominal adenopathy. Other: Trace ascites adjacent to the left dominant small bowel loops, including on 25/10. Musculoskeletal: No acute osseous abnormality. IMPRESSION: 1. Decreased sensitivity and specificity exam due to technique related factors, as described above. 2. Choledocholithiasis, with a 1.3 cm distal common duct stone. 3. Gallbladder distension with mild pericholecystic edema. Findings are suspicious for acute cholecystitis. 4. Soft tissue signal within the gallbladder lumen is similar to on the prior exam. 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 5. Cirrhosis and segment 8 ablation defect, suboptimally evaluated without gross recurrent or metachronous hepatocellular carcinoma. 6. Hemosiderosis. 7. Trace abdominal ascites. 8. Similar upper pole left renal complex cystic lesion, incompletely characterized. Findings were communicated with the clinical service subsequent to a preliminary interpretation on the evening of 06/17/2020. Electronically Signed   By: Abigail Miyamoto M.D.   On: 06/25/2020 04:23   DG C-Arm 1-60 Min-No Report  Result Date: 06/18/2020 Fluoroscopy was utilized by the requesting physician.  No radiographic interpretation.   Anti-infectives (From admission, onward)   Start     Dose/Rate Route Frequency Ordered Stop   06/21/2020 0430  piperacillin-tazobactam (ZOSYN) IVPB 2.25 g     Discontinue     2.25 g 100 mL/hr over 30 Minutes Intravenous Every 8 hours 06/25/2020 2147     06/23/2020 2000  piperacillin-tazobactam (ZOSYN) IVPB 3.375 g        3.375 g 12.5 mL/hr over 240 Minutes Intravenous  Once  06/24/2020 1957 07/04/2020 2328    .    Assessment/Plan ESRD on HD T/Th/Sat Hepatocellular carcinoma (dx 02/2015 S/P thermoablation follows with Dr. Burr Medico) Alcoholic liver cirrhosis HTN HLD Thrombocytopenia - Plt 78 Acute on chronic anemia Hyponatremia/hypochloremia   Choledocholithiasis ?Acute cholecystitis  - MRCP shows choledocholithiasis, possible acute cholecystitis with gallbladder distension and mild pericholecystic edema, concern for possible gallbladder neoplasm, and cirrhosis with trace abdominal ascites - Elevated LFTs with bilirubin uptrending 5.4<<5 - S/p unsuccessful ERCP 7/18: due to distorted anatomy (very dilated gallbladder and cystic duct) no papillary orifice was seen and attempts at cholangiogram were failed; appears to be a very distal stone  ID - zosyn 7/17>> VTE - SCDs, sq heparin FEN - NPO Foley - none Follow up - TBD  Plan: Will follow up with GI regarding plans for possible repeat ERCP - Dr. Henrene Pastor to see. Continue IV antibiotics for now for possible acute cholecystitis.   [place], [facility] 07/14/2020, 8:55 AM Please see Amion for pager number during day hours 7:00am-4:30pm

## 2020-07-05 NOTE — Progress Notes (Signed)
Ongoing shock through evening despite aggressive resuscitation. Ongoing bloody JP output, surgery aware, do not think any intervention would be survivable.  Plan is continued supportive care.

## 2020-07-05 NOTE — Consult Note (Addendum)
[facility] Renal Consultation Note    Indication for Consultation:  Management of ESRD/hemodialysis; anemia, hypertension/volume and secondary hyperparathyroidism PCP: Dr. Cathlean Cower  HPI: Sarah Harmon is a 74 y.o. female with ESRD on hemodialysis T,Th,S at [facility]. PMH: ESRD 2/2 HTN, H/O ETOH abuse, hepatocellular carcinoma without concern for reoccurrence (Per Dr. Burr Medico 02/18/2020) cirrhosis, gallstones, AOCD, SHPT. Last hemodialysis 07/06/2020-ran full treatment, left 0.3 under OP EDW.   Patient presented to ED post HD 07/02/2020 with severe abdominal pain. ERCP was performed per Dr. Christophe Louis 06/27/2020. 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 General surgery has been consulted.   Patient seen in room, she has no complaints of abdominal pain at present but guarding abdomin during exam. She says pain was upper quadrants on admission. She denies nausea, vomiting. She is currently NPO. Family at bedside.    Past Medical History:  Diagnosis Date  . ALCOHOL ABUSE 07/13/2010   Quit drinking 2016  . Depression with anxiety 12/22/2009  . 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 DJD (degenerative joint disease), cervical    patient denies on preop of 05/10/15   . ESRD (end stage renal disease) on dialysis ([place])    HD TTS  . GALLSTONES 12/22/2009  . Headache    occasional   . Hepatocellular carcinoma ([place]) 2016   04/2015 RFA.  Marland Kitchen HYPERLIPIDEMIA 12/22/2009  . HYPERTENSION 12/22/2009  . HYPONATREMIA 12/22/2009  . 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 OSTEOPENIA 12/22/2009  . Pancytopenia 07/13/2010  . RENAL CYST, LEFT 12/22/2009  . VITAMIN D DEFICIENCY 12/23/2010  . WEIGHT LOSS 12/23/2010   Past Surgical History:  Procedure Laterality Date  . A/V FISTULAGRAM N/A 10/25/2018   Procedure: A/V FISTULAGRAM;  Surgeon: Elam Dutch, MD;  Location: [facility];  Service:  Cardiovascular;  Laterality: N/A;  . ABDOMINAL HYSTERECTOMY    . APPENDECTOMY    . AV FISTULA PLACEMENT Left 05/21/2018   Procedure: INSERTION OF ARTERIOVENOUS (AV) GORE-TEX GRAFT UPPER ARM;  Surgeon: Elam Dutch, MD;  Location: [facility] OR;  Service: Vascular;  Laterality: Left;  . AV FISTULA PLACEMENT Left 06/03/2018   Procedure: INSERTION OF ARTERIOVENOUS (AV) GORE-TEX 53mm x 10cm GRAFT INTO LEFT ARM;  Surgeon: Elam Dutch, MD;  Location: [facility];  Service: Vascular;  Laterality: Left;  . COLONOSCOPY    . ESOPHAGOGASTRODUODENOSCOPY    . IR GENERIC HISTORICAL  10/18/2016   IR RADIOLOGIST EVAL & MGMT 10/18/2016 [name], MD [facility] INTERV RAD  . IR RADIOLOGIST EVAL & MGMT  05/30/2017  . IR RADIOLOGIST EVAL & MGMT  08/28/2018  . IR RADIOLOGIST EVAL & MGMT  10/08/2019  . IR RADIOLOGIST EVAL & MGMT  10/30/2019  . surgery for liver ca    . THROMBECTOMY AND REVISION OF ARTERIOVENTOUS (AV) GORETEX  GRAFT Left 06/03/2018   Procedure: THROMBECTOMY AND REVISION OF ARTERIOVENTOUS (AV) GORETEX  GRAFT ARM;  Surgeon: Elam Dutch, MD;  Location: [facility] OR;  Service: Vascular;  Laterality: Left;   Family History  Problem Relation Age of Onset  . Lung cancer Brother   . Breast cancer Other   . Breast cancer Sister   . Gastric cancer Sister   . Lung cancer Sister   . Colon cancer Neg Hx   . Colon polyps Neg Hx   . Rectal cancer Neg Hx   . Stomach cancer Neg Hx   . Esophageal cancer Neg Hx  Social History:  reports that she has never smoked. She has never used smokeless tobacco. She reports previous alcohol use of about 3.0 - 4.0 standard drinks of alcohol per week. She reports that she does not use drugs. No Known Allergies Prior to Admission medications   Medication Sig Start Date End Date Taking? Authorizing Provider  Acetaminophen (TYLENOL PO) Take 1 tablet by mouth 3 (three) times a week.   Yes [provider]  ALPRAZolam (XANAX) 0.5 MG tablet TAKE 1 TABLET BY MOUTH ONCE DAILY  AT BEDTIME Patient taking differently: Take 0.5 mg by mouth at bedtime.  06/23/20  Yes Biagio Borg, MD  amLODipine (NORVASC) 10 MG tablet Take 1 tablet by mouth once daily Patient taking differently: Take 10 mg by mouth daily.  06/04/20  Yes Biagio Borg, MD  B Complex-C-Zn-Folic Acid (DIALYVITE 867 WITH ZINC) 0.8 MG TABS Take 1 tablet by mouth daily. 09/29/18  Yes [provider]  CONSTULOSE 10 GM/15ML solution TAKE 30 ML BY MOUTH ONCE DAILY Patient taking differently: Take 20 g by mouth daily.  05/19/20  Yes Biagio Borg, MD  folic acid (FOLVITE) 1 MG tablet Take 1 tablet by mouth once daily Patient taking differently: Take 1 mg by mouth daily.  01/26/20  Yes Biagio Borg, MD  hydrALAZINE (APRESOLINE) 100 MG tablet TAKE 1 TABLET BY MOUTH THREE TIMES DAILY Patient taking differently: Take 100 mg by mouth 3 (three) times daily.  01/19/20  Yes Biagio Borg, MD  ketotifen (ZADITOR) 0.025 % ophthalmic solution Place 1 drop into both eyes 2 (two) times daily.   Yes [provider]  lidocaine-prilocaine (EMLA) cream Apply 1 application topically daily as needed (use before dialysis).  10/11/18  Yes [provider]  propranolol (INDERAL) 40 MG tablet Take 1 tablet by mouth twice daily Patient taking differently: Take 40 mg by mouth 2 (two) times daily.  04/06/20  Yes Biagio Borg, MD  Propylene Glycol (SYSTANE BALANCE) 0.6 % SOLN Place 1 drop into both eyes daily.   Yes [provider]  Darbepoetin Alfa (ARANESP) 100 MCG/0.5ML SOSY injection Inject 0.5 mLs (100 mcg total) into the vein every Thursday with hemodialysis. 07/25/18   Cherene Altes, MD  Methoxy PEG-Epoetin Beta (MIRCERA IJ) Mircera 04/06/20 04/05/21  [provider]   Current Facility-Administered Medications  Medication Dose Route Frequency Provider Last Rate Last Admin  . acetaminophen (TYLENOL) tablet 650 mg  650 mg Oral Q6H PRN Gatha Mayer, MD       Or  . acetaminophen (TYLENOL)  suppository 650 mg  650 mg Rectal Q6H PRN Gatha Mayer, MD      . ALPRAZolam Duanne Moron) tablet 0.5 mg  0.5 mg Oral QHS Gatha Mayer, MD   0.5 mg at 07/01/2020 2205  . ketotifen (ZADITOR) 0.025 % ophthalmic solution 1 drop  1 drop Both Eyes BID Gatha Mayer, MD   1 drop at 06/22/2020 0848  . lactulose (CHRONULAC) 10 GM/15ML solution 20 g  20 g Oral Daily Gatha Mayer, MD      . morphine 2 MG/ML injection 2 mg  2 mg Intravenous Q4H PRN Gatha Mayer, MD   2 mg at 06/24/2020 0845   Or  . morphine 4 MG/ML injection 4 mg  4 mg Intravenous Q4H PRN Gatha Mayer, MD      . multivitamin (RENA-VIT) tablet 1 tablet  1 tablet Oral QHS Gatha Mayer, MD   1 tablet  at 06/24/2020 2205  . ondansetron (ZOFRAN) tablet 4 mg  4 mg Oral Q6H PRN Gatha Mayer, MD       Or  . ondansetron [facility]) injection 4 mg  4 mg Intravenous Q6H PRN Gatha Mayer, MD      . piperacillin-tazobactam (ZOSYN) IVPB 2.25 g  2.25 g Intravenous Q8H Gatha Mayer, MD 100 mL/hr at 06/24/2020 0533 2.25 g at 06/18/2020 0533  . polyethylene glycol (MIRALAX / GLYCOLAX) packet 17 g  17 g Oral Daily PRN Gatha Mayer, MD      . polyvinyl alcohol (LIQUIFILM TEARS) 1.4 % ophthalmic solution 1 drop  1 drop Both Eyes Daily Gatha Mayer, MD   1 drop at 06/21/2020 0848  . propranolol (INDERAL) tablet 20 mg  20 mg Oral BID Kayleen Memos, [place]       Labs: Basic Metabolic Panel: Recent Labs  Lab 07/02/2020 1506 07/08/2020 0026 06/18/2020 0628  NA 133* 129* 130*  K 3.8 3.9 3.8  CL 93* 91* 91*  CO2 27 26 23   GLUCOSE 101* 93 73  BUN 12 20 38*  CREATININE 3.53* 4.49* 7.12*  CALCIUM 8.9 8.8* 8.3*   Liver Function Tests: Recent Labs  Lab 07/11/2020 1506 06/27/2020 0026 07/09/2020 0628  AST 169* 132* 92*  ALT 116* 93* 63*  ALKPHOS 147* 141* 147*  BILITOT 5.0* 5.1* 5.4*  PROT 7.3 6.7 6.2*  ALBUMIN 2.9* 2.5* 2.3*   Recent Labs  Lab 06/19/2020 1506  LIPASE 34   No results for input(s): AMMONIA in the last 168 hours. CBC: Recent Labs   Lab 07/08/2020 1506 07/12/2020 0026 07/05/20 0628  WBC 9.3 7.5 4.0  NEUTROABS  --  6.0  --   HGB 9.4* 8.4* 7.7*  HCT 28.8* 25.6* 23.5*  MCV 89.2 91.1 89.0  PLT 107* 99* 78*   Cardiac Enzymes: No results for input(s): CKTOTAL, CKMB, CKMBINDEX, TROPONINI in the last 168 hours. CBG: No results for input(s): GLUCAP in the last 168 hours. Iron Studies: No results for input(s): IRON, TIBC, TRANSFERRIN, FERRITIN in the last 72 hours. Studies/Results: MR ABDOMEN MRCP WO CONTRAST  Result Date: 07/04/2020 CLINICAL DATA:  Cholelithiasis. Nausea and vomiting. Abdominal pain. EXAM: MRI ABDOMEN WITHOUT CONTRAST  (INCLUDING MRCP) TECHNIQUE: Multiplanar multisequence MR imaging of the abdomen was performed. Heavily T2-weighted images of the biliary and pancreatic ducts were obtained, and three-dimensional MRCP images were rendered by post processing. COMPARISON:  Ultrasound of 05/31/2020.  Prior MRI of 09/24/2019. FINDINGS: Mild motion degradation. Exam is also limited by lack of intravenous contrast. Lower chest: Mild cardiomegaly.  No pericardial or pleural effusion. Hepatobiliary: Mild cirrhosis. Iron deposition within the liver. Segment 8 ablation defect is subtly apparent on 13/10. Suboptimally evaluated, without gross local recurrence. No new liver lesion. The gallbladder is distended, including 11.5 cm on 17/7. Mild pericystic edema is identified. Soft tissue density within the gallbladder lumen is similar in size to on the prior exam, including at 2.9 cm on 23/6. Mild intrahepatic biliary duct dilatation. Upper normal to mild common duct dilatation, including at 9 mm on 20/7. Distal common duct stone including at 1.3 cm on 21/7. Pancreas: Grossly normal for age, without duct dilatation or acute inflammation. Spleen:  Iron deposition within. Adrenals/Urinary Tract: Normal adrenal glands. Moderate renal atrophy bilaterally. Complex cystic lesion or lesions within the upper pole left kidney are grossly  similar. Example at 4.0 x 2.9 cm on 16/6 versus 3.9 x 2.5 cm on the prior exam (when remeasured).  No hydronephrosis. Stomach/Bowel: Normal stomach and bowel loops. Vascular/Lymphatic: Normal aortic caliber. No abdominal adenopathy. Other: Trace ascites adjacent to the left dominant small bowel loops, including on 25/10. Musculoskeletal: No acute osseous abnormality. IMPRESSION: 1. Decreased sensitivity and specificity exam due to technique related factors, as described above. 2. Choledocholithiasis, with a 1.3 cm distal common duct stone. 3. Gallbladder distension with mild pericholecystic edema. Findings are suspicious for acute cholecystitis. 4. Soft tissue signal within the gallbladder lumen is similar to on the prior exam. 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 5. Cirrhosis and segment 8 ablation defect, suboptimally evaluated without gross recurrent or metachronous hepatocellular carcinoma. 6. Hemosiderosis. 7. Trace abdominal ascites. 8. Similar upper pole left renal complex cystic lesion, incompletely characterized. Findings were communicated with the clinical service subsequent to a preliminary interpretation on the evening of 06/17/2020. Electronically Signed   By: Abigail Miyamoto M.D.   On: 06/27/2020 04:23   DG C-Arm 1-60 Min-No Report  Result Date: 07/02/2020 Fluoroscopy was utilized by the requesting physician.  No radiographic interpretation.    ROS: As per HPI otherwise negative.  \.  Physical Exam: Vitals:   07/12/2020 2205 07/15/2020 0017 07/12/2020 0457 07/12/2020 0843  BP: (!) 108/41 (!) 96/46 (!) 104/49 (!) 99/48  Pulse:  (!) 52 (!) 56 (!) 57  Resp:  18 18   Temp:  97.9 F (36.6 C) 97.7 F (36.5 C)   TempSrc:  Oral Oral   SpO2:  100% 100%   Weight:   52.4 kg      General: Chronically ill appearing elderly female in NAD Head: Normocephalic, atraumatic, sclera  non-icteric, mucus membranes are moist Neck: Supple. JVD not elevated. Lungs: Clear bilaterally to auscultation without wheezes, rales, or rhonchi. Breathing is unlabored. Heart: RRR with S1 S2. No murmurs, rubs, or gallops appreciated. Abdomen: Soft, non-tender, non-distended with normoactive bowel sounds. Guarding during palpation. No obvious abdominal masses. M-S:  Strength and tone appear normal for age. Lower extremities:without edema or ischemic changes, no open wounds  Neuro: Alert and oriented X 3. Moves all extremities spontaneously. Psych:  Responds to questions appropriately with a normal affect. Dialysis Access: L AVG + bruit  Dialysis Orders: Center: [facility] T,Th,S 4 hrs 180NRe 350/600 53 kg 3.0 K/ 2.25 Ca  LUA AVG -Heparin 2000 units IV TIW -Mircera 50 mcg IV q 2 weeks (last dose 06/29/2020) -Hectorol 7 mcg IV TIW  Assessment/Plan: 1.  Choledocholithiasis with acute cholecystitis- BC pending. On Zosyn per primary. Gall bladder mass found on ERCP. Gen surgery consulted.  2.  ESRD -  T,Th,S. HD 07/11/2020 on schedule. HGB falling, hold heparin.  3.0 K bath. Patient states she is cramping every treatment. EDW seems OK. Add UFP 2 and lower machine temp to 36 degrees C.  3.  Hypertension/volume  - BP on soft side. No evidence of volume overload by exam. Clonidine/Amlodipine on OP med list. These are currently on hold. Current wt actually under OP EDW.  UF as tolerated tomorrow.   4.  Anemia  - HGB 7.7 today down from 9.4 on admission. Recent lower dose ESA 06/29/2020. Transfuse as needed. May need ESA redosed. Follow HGB.  5.  Metabolic bone disease - Ca 8.3 C Ca 9.7. OP BMD at goal 06/24/2020. Continue VDRA, no binders on OP orders,   6.  Nutrition - NPO at present. Albumin low. Renal diet with protein supps when able to eat.  Choya Tornow H. Owens Shark, NP-C 07/12/2020, 11:20 AM  [place]

## 2020-07-05 NOTE — Progress Notes (Signed)
Added NS 250 cc bolus and Levophed for additional blood pressure support.  PCCM Dr. Lynetta Mare at bedside in the PACU.   Care transferred to [facility].  Updated the patient's daughter, Ms. [name], via phone.

## 2020-07-05 NOTE — Progress Notes (Signed)
Initial Nutrition Assessment  DOCUMENTATION CODES:   Not applicable  INTERVENTION:  Once diet advances, provide Boost Breeze po TID, each supplement provides 250 kcal and 9 grams of protein  NUTRITION DIAGNOSIS:   Increased nutrient needs related to chronic illness (ESRD, cirrhosis) as evidenced by estimated needs  GOAL:   Patient will meet greater than or equal to 90% of their needs  MONITOR:   Supplement acceptance, Skin, Weight trends, Labs, I & O's, Diet advancement  REASON FOR ASSESSMENT:   Malnutrition Screening Tool    ASSESSMENT:   74 year old female with past medical history of hypertension, hyperlipidemia, depression, anxiety, hepatocellular carcinoma (dx 02/2015 S/P thermoablation), ESRD on HD, alcoholic liver cirrhosis presents with severe abdominal pain. 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   Pt unavailable during attempted time of visit, currently in OR for lap chole with IOC. RD unable to obtain most recent pt nutrition history. RD to order nutritional supplements to aid in caloric and protein needs once diet advances. Unable to complete Nutrition-Focused physical exam at this time.   Labs and medications reviewed.   Diet Order:   Diet Order            Diet NPO time specified  Diet effective midnight           Diet NPO time specified  Diet effective midnight                 EDUCATION NEEDS:   Not appropriate for education at this time  Skin:  Skin Assessment: Reviewed RN Assessment  Last BM:  7/17  Height:   Ht Readings from Last 1 Encounters:  04/21/20 5\' 3"  (1.6 m)    Weight:   Wt Readings from Last 1 Encounters:  06/22/2020 52.4 kg   BMI:  Body mass index is 20.46 kg/m.  Estimated Nutritional Needs:   Kcal:  1700-1900  Protein:  80-95 grams  Fluid:  UOP +  1 L  Corrin Parker, MS, RD, LDN RD pager number/after hours weekend pager number on Amion.

## 2020-07-05 NOTE — Progress Notes (Signed)
Pt required active resuscitation that included emergency released blood products, fluids, a-line placement, central line placement, and multiple vasopressors, while in the PACU before being transferred to ICU. Her JP drain continued to have significant output. Dr. [place] Crane, Dr. Tobias Alexander, Dr. Marcie Bal, Dr. Kae Heller, Dr. Lynetta Mare and (resident) Dr. Rowe Robert were bedside prior to transfer. Pts pressure improved prior to transfer to ICU from PACU.

## 2020-07-05 NOTE — H&P (Signed)
NAME:  Sarah Harmon, MRN:  [id number], DOB:  06/23/1946, LOS: 2 ADMISSION DATE:  06/28/2020, CONSULTATION DATE:  06/29/2020 REFERRING MD: Kayleen Memos, DO CHIEF COMPLAINT:  Hemorrhagic shock   Brief History   PCCM consulted for admission to ICU after complicated cholecystectomy. Patient had Abnormal blood loss and needing massive transfusion.   History of present illness   74 year old female who presented to ED 7/17 with abdominal pain. Found to have choledocholithiasis and acute cholecystitis. Also noted to have lesion along gallbladder wall. GI attempted ERCP on 7/18, but noted to be unsuccessful secondary to distorted anatomy. Surgery and GI discussed case and plan made for patient to have lap cholecystectomy with  plans for intraoperative operative cholangiogram. Surgery complicated by abnormal bleeding during procedure and patient was hypotensive. Surgery stopped early and drains placed.   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 Decreased sensitivity and specificity exam due to technique related factors, as described above. 2. Choledocholithiasis, with a 1.3 cm distal common duct stone. 3. Gallbladder distension with mild pericholecystic edema. Findings are suspicious for acute cholecystitis. 4. Soft tissue signal within the gallbladder lumen is similar to on the prior exam. 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 5. Cirrhosis and segment 8 ablation defect, suboptimally evaluated without gross recurrent or metachronous  hepatocellular carcinoma. 6. Hemosiderosis. 7. Trace abdominal ascites. 8. Similar upper pole left renal complex cystic lesion, incompletely characterized.   Micro Data:    Antimicrobials:  Zosyn 7/17 >>  Interim history/subjective:    Objective   Blood pressure (!) 96/53, pulse (!) 44, temperature 98.2 F (36.8 C), temperature source Oral, resp. rate (!) 31, weight 52.4 kg, SpO2 100 %.        Intake/Output Summary (Last 24 hours) at 07/10/2020 1805 Last data filed at 06/30/2020 1752 Gross per 24 hour  Intake 1140 ml  Output 1740 ml  Net -600 ml   Filed Weights   06/23/2020 0457  Weight: 52.4 kg    Examination: General: frail and ill appearing, under bear hugger HENT: [place]/AT, central line on left side of neck, PERRL, pale conjuctiva Lungs: CTAB, no wheezes rales rhonchi Cardiovascular: nl s1 s2, no murmurs rubs gallops Abdomen: non distended , foley line place in right side, medial exit of JP drain Extremities: Warm, no deformities Neuro: Opens eyes to voice, appears to respond but not audible or purposeful.    Resolved Hospital Problem list     Assessment & Plan:  Choledocholithiasis with acute cholecystitis status post attempted lap chole complicated  Intraoperative acute blood loss and hypotension -See op note for details, Dr.Tsuei with [facility]. Dr. Tana Coast discussed case with Dr.Tsuei, diffuse bleed started With blunt dissection, no major vessels were cut or cauterized. Likely complication from hepatic dysfunction. Procedure stopped and foley placed in gallbladder. JP drain also in abdomen.  -Patient had estimated blood loss 1.2 L urgent release and transfusion of  2 units PRBC.  Patient being typed and crossed.  Plan for transfusion of 2 more units. 4 more units ordered ahead Started on pressor support with Neo and vasopressin. Given one 576ml  fluid bolus.  - Bradycardic , likely due to Neo. Will add levo.  Plan: - f/u post transfusion H&H - Patient will need  transfusion for Hgb < 7 or signs of ongoing blood loss and hemodynamic instability - Monitor JP drain and foley output.  - follow up surgery recommendations - Continue Zosyn  - 1 unit platelets - 1 unit FFP  - TXA 1 g  - Levo titrate to Map > 65, stop neo - Monitor blood loss, keep 4 units ahead. Give balanced transfusion with platelets and FFP.   ESRD on TThS Nephrology consulted, seen patient post op. Plan: - F/u Nephrology recommendations - RFP  Hx of HTN On review of home meds patient takes amlodipine 10 and hydralazine 100 TID - Hypotensive in setting of blood loss. Plan:  - Hold antihypertensives   Best practice:  Diet:NPO Pain/Anxiety/Delirium protocol (if indicated): na VAP protocol (if indicated): na DVT prophylaxis: none GI prophylaxis: na Glucose control: na Mobility: Bed rest Code Status: Full Family Communication: Updated by Dr.Hall - [facility] hospital[facility]  Disposition: ICU  Labs   CBC: Recent Labs  Lab 07/02/2020 1506 07/08/2020 0026 06/24/2020 0628 06/23/2020 1744  WBC 9.3 7.5 4.0  --   NEUTROABS  --  6.0  --   --   HGB 9.4* 8.4* 7.7* 9.6*  HCT 28.8* 25.6* 23.5* 28.6*  MCV 89.2 91.1 89.0  --   PLT 107* 99* 78*  --     Basic Metabolic Panel: Recent Labs  Lab 07/17/2020 1506 07/01/2020 0026 07/14/2020 0628  NA 133* 129* 130*  K 3.8 3.9 3.8  CL 93* 91* 91*  CO2 27 26 23   GLUCOSE 101* 93 73  BUN 12 20 38*  CREATININE 3.53* 4.49* 7.12*  CALCIUM 8.9 8.8* 8.3*  MG  --  1.8  --    GFR: Estimated Creatinine Clearance: 5.6 mL/min (A) (by C-G formula based on SCr of 7.12 mg/dL (H)). Recent Labs  Lab 07/15/2020 1506 07/17/2020 0026 07/09/2020 0628  WBC 9.3 7.5 4.0    Liver Function Tests: Recent Labs  Lab 06/23/2020 1506 07/07/2020 0026 07/05/20 0628  AST 169* 132* 92*  ALT 116* 93* 63*  ALKPHOS 147* 141* 147*  BILITOT 5.0* 5.1* 5.4*  PROT 7.3 6.7 6.2*  ALBUMIN 2.9* 2.5* 2.3*   Recent Labs  Lab 06/29/2020 1506  LIPASE 34   No results for input(s):  AMMONIA in the last 168 hours.  ABG    Component Value Date/Time   PHART 7.391 09/10/2017 0950   PCO2ART 27.1 (L) 09/10/2017 0950   PO2ART 78.4 (L) 09/10/2017 0950   HCO3 16.1 (L) 09/10/2017 0950   TCO2 34 (H) 10/25/2018 1126   ACIDBASEDEF 7.9 (H) 09/10/2017 0950   O2SAT 94.5 09/10/2017 0950     Coagulation Profile: Recent Labs  Lab 07/14/2020 0026  INR 1.2    Cardiac Enzymes: No results for input(s): CKTOTAL, CKMB, CKMBINDEX, TROPONINI in the last 168 hours.  HbA1C: Hgb A1c MFr Bld  Date/Time Value Ref Range Status  12/01/2019 09:40 AM 4.4 (L) 4.6 - 6.5 % Final    Comment:    Glycemic Control Guidelines for People with Diabetes:Non Diabetic:  <6%Goal of Therapy: <7%Additional Action Suggested:  >8%   06/06/2019 12:25 PM 5.1 4.6 - 6.5 % Final    Comment:    Glycemic Control Guidelines for People with Diabetes:Non Diabetic:  <6%Goal of Therapy: <7%Additional Action Suggested:  >8%     CBG: No results for input(s): GLUCAP in the last  168 hours.  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   Surgical History    Past Surgical History:  Procedure Laterality Date  . A/V FISTULAGRAM N/A 10/25/2018   Procedure: A/V FISTULAGRAM;  Surgeon: Elam Dutch, MD;  Location: [facility];  Service: Cardiovascular;  Laterality: N/A;  . ABDOMINAL HYSTERECTOMY    .  APPENDECTOMY    . AV FISTULA PLACEMENT Left 05/21/2018   Procedure: INSERTION OF ARTERIOVENOUS (AV) GORE-TEX GRAFT UPPER ARM;  Surgeon: Elam Dutch, MD;  Location: [facility];  Service: Vascular;  Laterality: Left;  . AV FISTULA PLACEMENT Left 06/03/2018   Procedure: INSERTION OF ARTERIOVENOUS (AV) GORE-TEX 90mm x 10cm GRAFT INTO LEFT ARM;  Surgeon: Elam Dutch, MD;  Location: [facility];  Service: Vascular;  Laterality: Left;  . COLONOSCOPY    . ENDOSCOPIC RETROGRADE CHOLANGIOPANCREATOGRAPHY (ERCP) WITH PROPOFOL N/A 06/28/2020   Procedure: ENDOSCOPIC RETROGRADE CHOLANGIOPANCREATOGRAPHY (ERCP) WITH PROPOFOL;  Surgeon: Gatha Mayer, MD;  Location: [facility];  Service: Endoscopy;  Laterality: N/A;  . ESOPHAGOGASTRODUODENOSCOPY    . IR GENERIC HISTORICAL  10/18/2016   IR RADIOLOGIST EVAL & MGMT 10/18/2016 [name], MD [facility] INTERV RAD  . IR RADIOLOGIST EVAL & MGMT  05/30/2017  . IR RADIOLOGIST EVAL & MGMT  08/28/2018  . IR RADIOLOGIST EVAL & MGMT  10/08/2019  . IR RADIOLOGIST EVAL & MGMT  10/30/2019  . surgery for liver ca    . THROMBECTOMY AND REVISION OF ARTERIOVENTOUS (AV) GORETEX  GRAFT Left 06/03/2018   Procedure: THROMBECTOMY AND REVISION OF ARTERIOVENTOUS (AV) GORETEX  GRAFT ARM;  Surgeon: Elam Dutch, MD;  Location: [facility];  Service: Vascular;  Laterality: Left;     Social History   reports that she has never smoked. She has never used smokeless tobacco. She reports previous alcohol use of about 3.0 - 4.0 standard drinks of alcohol per week. She reports that she does not use drugs.   Family History   Her family history includes Breast cancer in her sister and another family member; Gastric cancer in her sister; Lung cancer in her brother and sister. There is no history of Colon cancer, Colon polyps, Rectal cancer, Stomach cancer, or Esophageal cancer.   Allergies No Known Allergies   Home Medications  Prior to Admission medications   Medication Sig Start Date End Date  Taking? Authorizing Provider  Acetaminophen (TYLENOL PO) Take 1 tablet by mouth 3 (three) times a week.   Yes [provider]  ALPRAZolam (XANAX) 0.5 MG tablet TAKE 1 TABLET BY MOUTH ONCE DAILY AT BEDTIME Patient taking differently: Take 0.5 mg by mouth at bedtime.  06/23/20  Yes Biagio Borg, MD  amLODipine (NORVASC) 10 MG tablet Take 1 tablet by mouth once daily Patient taking differently: Take 10 mg by mouth daily.  06/04/20  Yes Biagio Borg, MD  B Complex-C-Zn-Folic Acid (DIALYVITE 161 WITH ZINC) 0.8 MG TABS Take 1 tablet by mouth daily. 09/29/18  Yes [provider]  CONSTULOSE 10 GM/15ML solution TAKE 30 ML BY MOUTH  ONCE DAILY Patient taking differently: Take 20 g by mouth daily.  05/19/20  Yes Biagio Borg, MD  folic acid (FOLVITE) 1 MG tablet Take 1 tablet by mouth once daily Patient taking differently: Take 1 mg by mouth daily.  01/26/20  Yes Biagio Borg, MD  hydrALAZINE (APRESOLINE) 100 MG tablet TAKE 1 TABLET BY MOUTH THREE TIMES DAILY Patient taking differently: Take 100 mg by mouth 3 (three) times daily.  01/19/20  Yes Biagio Borg, MD  ketotifen (ZADITOR) 0.025 % ophthalmic solution Place 1 drop into both eyes 2 (two) times daily.   Yes [provider]  lidocaine-prilocaine (EMLA) cream Apply 1 application topically daily as needed (use before dialysis).  10/11/18  Yes [provider]  propranolol (INDERAL) 40 MG tablet Take 1 tablet by mouth twice daily Patient taking differently: Take 40 mg by mouth 2 (two) times daily.  04/06/20  Yes Biagio Borg, MD  Propylene Glycol (SYSTANE BALANCE) 0.6 % SOLN Place 1 drop into both eyes daily.   Yes [provider]  Darbepoetin Alfa (ARANESP) 100 MCG/0.5ML SOSY injection Inject 0.5 mLs (100 mcg total) into the vein every Thursday with hemodialysis. 07/25/18   Cherene Altes, MD  Methoxy PEG-Epoetin Beta (MIRCERA IJ) Mircera 04/06/20 04/05/21  [provider]     Critical care time:      Tamsen Snider, MD PGY2 Internal medicine

## 2020-07-05 NOTE — Anesthesia Procedure Notes (Signed)
Central Venous Catheter Insertion Performed by: Duane Boston, MD, anesthesiologist Start/End07/05/2020 5:11 PM, 07/05/2020 5:21 PM Patient location: Pre-op. Preanesthetic checklist: patient identified, IV checked, site marked, risks and benefits discussed, surgical consent, monitors and equipment checked, pre-op evaluation, timeout performed and anesthesia consent Position: Trendelenburg Lidocaine 1% used for infiltration and patient sedated Hand hygiene performed , maximum sterile barriers used  and Seldinger technique used Catheter size: 8 Fr Total catheter length 16. Central line was placed.Double lumen Procedure performed using ultrasound guided technique. Ultrasound Notes:image(s) printed for medical record Attempts: 1 Following insertion, dressing applied, line sutured and Biopatch. Post procedure assessment: blood return through all ports, free fluid flow and no air  Patient tolerated the procedure well with no immediate complications.

## 2020-07-05 NOTE — Progress Notes (Signed)
07/14/2020  Worsening bleeding, continued coagulopathy despite FFP/cryo/DDAVP/bicarb/plts. Pressors increasing. Surgery at bedside re-evaluating. Asking RN to call family in.  Erskine Emery MD PCCM

## 2020-07-05 NOTE — Progress Notes (Signed)
[facility] Progress Note Patient Name: ZYASIA HALBLEIB DOB: 15-Feb-1946 MRN: [id number]   Date of Service  07/16/2020  HPI/Events of Note  Nursing request for multiple labs.   eICU Interventions  Plan: 1. ABG, CBC with platelets, BMP and Mg++ level STAT. 2. Monitor CVP now and Q 4 hours.  3. Trend Lactic Acid.      Intervention Category Major Interventions: Other:  Lysle Dingwall 07/17/2020, 8:36 PM

## 2020-07-06 ENCOUNTER — Inpatient Hospital Stay (HOSPITAL_COMMUNITY): Payer: Medicare Other

## 2020-07-06 ENCOUNTER — Encounter (HOSPITAL_COMMUNITY): Admission: EM | Disposition: E | Payer: Self-pay | Source: Home / Self Care | Attending: Critical Care Medicine

## 2020-07-06 ENCOUNTER — Encounter (HOSPITAL_COMMUNITY): Payer: Self-pay | Admitting: Surgery

## 2020-07-06 DIAGNOSIS — D62 Acute posthemorrhagic anemia: Secondary | ICD-10-CM

## 2020-07-06 DIAGNOSIS — N186 End stage renal disease: Secondary | ICD-10-CM

## 2020-07-06 DIAGNOSIS — D631 Anemia in chronic kidney disease: Secondary | ICD-10-CM

## 2020-07-06 DIAGNOSIS — D689 Coagulation defect, unspecified: Secondary | ICD-10-CM

## 2020-07-06 DIAGNOSIS — J9600 Acute respiratory failure, unspecified whether with hypoxia or hypercapnia: Secondary | ICD-10-CM

## 2020-07-06 DIAGNOSIS — Z452 Encounter for adjustment and management of vascular access device: Secondary | ICD-10-CM

## 2020-07-06 DIAGNOSIS — K7030 Alcoholic cirrhosis of liver without ascites: Secondary | ICD-10-CM

## 2020-07-06 DIAGNOSIS — K8042 Calculus of bile duct with acute cholecystitis without obstruction: Secondary | ICD-10-CM

## 2020-07-06 DIAGNOSIS — R578 Other shock: Secondary | ICD-10-CM

## 2020-07-06 DIAGNOSIS — Z9911 Dependence on respirator [ventilator] status: Secondary | ICD-10-CM

## 2020-07-06 DIAGNOSIS — D696 Thrombocytopenia, unspecified: Secondary | ICD-10-CM

## 2020-07-06 LAB — POCT I-STAT 7, (LYTES, BLD GAS, ICA,H+H)
Acid-base deficit: 8 mmol/L — ABNORMAL HIGH (ref 0.0–2.0)
Bicarbonate: 15 mmol/L — ABNORMAL LOW (ref 20.0–28.0)
Calcium, Ion: 0.83 mmol/L — CL (ref 1.15–1.40)
HCT: 19 % — ABNORMAL LOW (ref 36.0–46.0)
Hemoglobin: 6.5 g/dL — CL (ref 12.0–15.0)
O2 Saturation: 98 %
Patient temperature: 92.5
Potassium: 3.2 mmol/L — ABNORMAL LOW (ref 3.5–5.1)
Sodium: 146 mmol/L — ABNORMAL HIGH (ref 135–145)
TCO2: 16 mmol/L — ABNORMAL LOW (ref 22–32)
pCO2 arterial: 19 mmHg — CL (ref 32.0–48.0)
pH, Arterial: 7.491 — ABNORMAL HIGH (ref 7.350–7.450)
pO2, Arterial: 80 mmHg — ABNORMAL LOW (ref 83.0–108.0)

## 2020-07-06 LAB — BPAM FFP
Blood Product Expiration Date: 202107212359
Blood Product Expiration Date: 202107242359
Blood Product Expiration Date: 202107242359
ISSUE DATE / TIME: 202107191831
ISSUE DATE / TIME: 202107192047
Unit Type and Rh: 6200
Unit Type and Rh: 5100
Unit Type and Rh: 5100

## 2020-07-06 LAB — PREPARE RBC (CROSSMATCH)

## 2020-07-06 LAB — CBC
HCT: 17.3 % — ABNORMAL LOW (ref 36.0–46.0)
HCT: 19.4 % — ABNORMAL LOW (ref 36.0–46.0)
Hemoglobin: 5.8 g/dL — CL (ref 12.0–15.0)
Hemoglobin: 6.3 g/dL — CL (ref 12.0–15.0)
MCH: 29.9 pg (ref 26.0–34.0)
MCH: 29.2 pg (ref 26.0–34.0)
MCHC: 33.5 g/dL (ref 30.0–36.0)
MCHC: 32.5 g/dL (ref 30.0–36.0)
MCV: 89.2 fL (ref 80.0–100.0)
MCV: 89.8 fL (ref 80.0–100.0)
Platelets: 80 10*3/uL — ABNORMAL LOW (ref 150–400)
Platelets: 55 10*3/uL — ABNORMAL LOW (ref 150–400)
RBC: 1.94 MIL/uL — ABNORMAL LOW (ref 3.87–5.11)
RBC: 2.16 MIL/uL — ABNORMAL LOW (ref 3.87–5.11)
RDW: 14.6 % (ref 11.5–15.5)
RDW: 14.6 % (ref 11.5–15.5)
WBC: 8.7 10*3/uL (ref 4.0–10.5)
WBC: 12.5 10*3/uL — ABNORMAL HIGH (ref 4.0–10.5)
nRBC: 2.4 % — ABNORMAL HIGH (ref 0.0–0.2)
nRBC: 4.6 % — ABNORMAL HIGH (ref 0.0–0.2)

## 2020-07-06 LAB — PREPARE PLATELET PHERESIS: Unit division: 0

## 2020-07-06 LAB — GLUCOSE, CAPILLARY
Glucose-Capillary: 25 mg/dL — CL (ref 70–99)
Glucose-Capillary: 136 mg/dL — ABNORMAL HIGH (ref 70–99)
Glucose-Capillary: 193 mg/dL — ABNORMAL HIGH (ref 70–99)
Glucose-Capillary: 29 mg/dL — CL (ref 70–99)
Glucose-Capillary: 51 mg/dL — ABNORMAL LOW (ref 70–99)
Glucose-Capillary: 52 mg/dL — ABNORMAL LOW (ref 70–99)
Glucose-Capillary: 191 mg/dL — ABNORMAL HIGH (ref 70–99)
Glucose-Capillary: 25 mg/dL — CL (ref 70–99)
Glucose-Capillary: 251 mg/dL — ABNORMAL HIGH (ref 70–99)
Glucose-Capillary: 142 mg/dL — ABNORMAL HIGH (ref 70–99)

## 2020-07-06 LAB — PREPARE FRESH FROZEN PLASMA
Unit division: 0
Unit division: 0

## 2020-07-06 LAB — BPAM PLATELET PHERESIS
Blood Product Expiration Date: 202107192359
ISSUE DATE / TIME: 202107191833
Unit Type and Rh: 5100

## 2020-07-06 LAB — COMPREHENSIVE METABOLIC PANEL
ALT: 1035 U/L — ABNORMAL HIGH (ref 0–44)
AST: 2666 U/L — ABNORMAL HIGH (ref 15–41)
Albumin: 2.5 g/dL — ABNORMAL LOW (ref 3.5–5.0)
Alkaline Phosphatase: 63 U/L (ref 38–126)
Anion gap: 28 — ABNORMAL HIGH (ref 5–15)
BUN: 33 mg/dL — ABNORMAL HIGH (ref 8–23)
CO2: 15 mmol/L — ABNORMAL LOW (ref 22–32)
Calcium: 9.2 mg/dL (ref 8.9–10.3)
Chloride: 101 mmol/L (ref 98–111)
Creatinine, Ser: 6.3 mg/dL — ABNORMAL HIGH (ref 0.44–1.00)
GFR calc Af Amer: 7 mL/min — ABNORMAL LOW (ref >60)
GFR calc non Af Amer: 6 mL/min — ABNORMAL LOW (ref >60)
Glucose, Bld: 54 mg/dL — ABNORMAL LOW (ref 70–99)
Potassium: 3.5 mmol/L (ref 3.5–5.1)
Sodium: 144 mmol/L (ref 135–145)
Total Bilirubin: 6.8 mg/dL — ABNORMAL HIGH (ref 0.3–1.2)
Total Protein: 5.2 g/dL — ABNORMAL LOW (ref 6.5–8.1)

## 2020-07-06 LAB — PROTIME-INR
INR: 2.1 — ABNORMAL HIGH (ref 0.8–1.2)
INR: 2.1 — ABNORMAL HIGH (ref 0.8–1.2)
Prothrombin Time: 23.1 seconds — ABNORMAL HIGH (ref 11.4–15.2)
Prothrombin Time: 22.4 seconds — ABNORMAL HIGH (ref 11.4–15.2)

## 2020-07-06 LAB — LACTIC ACID, PLASMA: Lactic Acid, Venous: >11 mmol/L (ref 0.5–1.9)

## 2020-07-06 LAB — BLOOD PRODUCT ORDER (VERBAL) VERIFICATION

## 2020-07-06 SURGERY — ERCP, WITH INTERVENTION IF INDICATED
Anesthesia: General

## 2020-07-06 MED ORDER — PRISMASOL BGK 4/2.5 32-4-2.5 MEQ/L REPLACEMENT SOLN
Status: DC
  Filled 2020-07-06 (×2): qty 5000

## 2020-07-06 MED ORDER — SODIUM CHLORIDE 0.9% FLUSH
10.0000 mL | Freq: Two times a day (BID) | INTRAVENOUS | Status: DC
  Administered 2020-07-06: 40 mL
  Administered 2020-07-06 – 2020-07-13 (×12): 10 mL

## 2020-07-06 MED ORDER — SODIUM BICARBONATE 8.4 % IV SOLN
100.0000 meq | Freq: Once | INTRAVENOUS | Status: AC
  Administered 2020-07-06: 100 meq via INTRAVENOUS

## 2020-07-06 MED ORDER — ETOMIDATE 2 MG/ML IV SOLN
INTRAVENOUS | Status: AC
  Filled 2020-07-06: qty 10

## 2020-07-06 MED ORDER — DEXTROSE 50 % IV SOLN
1.0000 | Freq: Once | INTRAVENOUS | Status: AC
  Administered 2020-07-06: 50 mL via INTRAVENOUS

## 2020-07-06 MED ORDER — IOHEXOL 9 MG/ML PO SOLN
500.0000 mL | ORAL | Status: AC
  Administered 2020-07-06: 500 mL via ORAL

## 2020-07-06 MED ORDER — SODIUM CHLORIDE 0.9% IV SOLUTION: Freq: Once | INTRAVENOUS | Status: AC

## 2020-07-06 MED ORDER — PRISMASOL BGK 4/2.5 32-4-2.5 MEQ/L REPLACEMENT SOLN
Status: DC
  Filled 2020-07-06: qty 5000

## 2020-07-06 MED ORDER — PANTOPRAZOLE SODIUM 40 MG IV SOLR
40.0000 mg | INTRAVENOUS | Status: DC
  Administered 2020-07-06 – 2020-07-07 (×2): 40 mg via INTRAVENOUS
  Filled 2020-07-06 (×2): qty 40

## 2020-07-06 MED ORDER — DEXTROSE 50 % IV SOLN: 25.0000 g | INTRAVENOUS | Status: AC

## 2020-07-06 MED ORDER — SODIUM CHLORIDE 0.9% IV SOLUTION: Freq: Once | INTRAVENOUS | Status: DC

## 2020-07-06 MED ORDER — THIAMINE HCL 100 MG PO TABS
100.0000 mg | ORAL_TABLET | Freq: Every day | ORAL | Status: DC
  Administered 2020-07-06 – 2020-07-12 (×6): 100 mg
  Filled 2020-07-06 (×6): qty 1

## 2020-07-06 MED ORDER — FENTANYL CITRATE (PF) 100 MCG/2ML IJ SOLN
25.0000 ug | INTRAMUSCULAR | Status: DC | PRN
  Administered 2020-07-08 – 2020-07-09 (×2): 25 ug via INTRAVENOUS
  Filled 2020-07-06 (×3): qty 2

## 2020-07-06 MED ORDER — CALCIUM GLUCONATE-NACL 1-0.675 GM/50ML-% IV SOLN
1.0000 g | Freq: Once | INTRAVENOUS | Status: AC
  Administered 2020-07-06: 1000 mg via INTRAVENOUS
  Filled 2020-07-06: qty 50

## 2020-07-06 MED ORDER — PIPERACILLIN-TAZOBACTAM IN DEX 2-0.25 GM/50ML IV SOLN
2.2500 g | Freq: Three times a day (TID) | INTRAVENOUS | Status: DC
  Filled 2020-07-06: qty 50

## 2020-07-06 MED ORDER — DOCUSATE SODIUM 50 MG/5ML PO LIQD
100.0000 mg | Freq: Two times a day (BID) | ORAL | Status: DC
  Administered 2020-07-06 – 2020-07-12 (×8): 100 mg
  Filled 2020-07-06 (×8): qty 10

## 2020-07-06 MED ORDER — PHENYLEPHRINE CONCENTRATED 100MG/250ML (0.4 MG/ML) INFUSION SIMPLE
0.0000 ug/min | INTRAVENOUS | Status: DC
  Administered 2020-07-06: 400 ug/min via INTRAVENOUS
  Administered 2020-07-06: 50 ug/min via INTRAVENOUS
  Filled 2020-07-06 (×2): qty 250

## 2020-07-06 MED ORDER — MIDAZOLAM HCL 2 MG/2ML IJ SOLN
2.0000 mg | Freq: Once | INTRAMUSCULAR | Status: AC
  Administered 2020-07-06: 2 mg via INTRAVENOUS

## 2020-07-06 MED ORDER — VITAL HIGH PROTEIN PO LIQD: 1000.0000 mL | ORAL | Status: DC

## 2020-07-06 MED ORDER — DEXTROSE 10 % IV SOLN: INTRAVENOUS | Status: DC

## 2020-07-06 MED ORDER — PRISMASOL BGK 4/2.5 32-4-2.5 MEQ/L REPLACEMENT SOLN
Status: DC
  Filled 2020-07-06 (×3): qty 5000

## 2020-07-06 MED ORDER — PRISMASOL BGK 4/2.5 32-4-2.5 MEQ/L IV SOLN
INTRAVENOUS | Status: DC
  Filled 2020-07-06 (×7): qty 5000

## 2020-07-06 MED ORDER — DEXTROSE 5 % IV SOLN: INTRAVENOUS | Status: DC

## 2020-07-06 MED ORDER — ADULT MULTIVITAMIN LIQUID CH
15.0000 mL | Freq: Every day | ORAL | Status: DC
  Administered 2020-07-06 – 2020-07-07 (×2): 15 mL
  Filled 2020-07-06 (×2): qty 15

## 2020-07-06 MED ORDER — DEXTROSE 50 % IV SOLN
INTRAVENOUS | Status: AC
  Administered 2020-07-06: 50 mL
  Filled 2020-07-06: qty 50

## 2020-07-06 MED ORDER — DEXTROSE 50 % IV SOLN
50.0000 mL | Freq: Once | INTRAVENOUS | Status: AC
  Administered 2020-07-06: 50 mL via INTRAVENOUS

## 2020-07-06 MED ORDER — SODIUM CHLORIDE 0.9% FLUSH: 10.0000 mL | INTRAVENOUS | Status: DC | PRN

## 2020-07-06 MED ORDER — DEXTROSE 50 % IV SOLN
INTRAVENOUS | Status: AC
  Filled 2020-07-06: qty 50

## 2020-07-06 MED ORDER — HEPARIN SODIUM (PORCINE) 1000 UNIT/ML IJ SOLN
3000.0000 [IU] | Freq: Once | INTRAMUSCULAR | Status: AC
  Administered 2020-07-06: 3000 [IU] via INTRAVENOUS
  Filled 2020-07-06: qty 3

## 2020-07-06 MED ORDER — CHLORHEXIDINE GLUCONATE CLOTH 2 % EX PADS: 6.0000 | MEDICATED_PAD | Freq: Every day | CUTANEOUS | Status: DC

## 2020-07-06 MED ORDER — PRISMASOL BGK 4/2.5 32-4-2.5 MEQ/L IV SOLN
INTRAVENOUS | Status: DC
  Filled 2020-07-06 (×12): qty 5000

## 2020-07-06 MED ORDER — POLYETHYLENE GLYCOL 3350 17 G PO PACK
17.0000 g | PACK | Freq: Every day | ORAL | Status: DC
  Administered 2020-07-07: 17 g
  Filled 2020-07-06 (×2): qty 1

## 2020-07-06 MED ORDER — POLYETHYLENE GLYCOL 3350 17 G PO PACK
17.0000 g | PACK | Freq: Every day | ORAL | Status: DC
  Administered 2020-07-06: 17 g via ORAL

## 2020-07-06 MED ORDER — MIDAZOLAM HCL 2 MG/2ML IJ SOLN
INTRAMUSCULAR | Status: AC
  Filled 2020-07-06: qty 2

## 2020-07-06 MED ORDER — SODIUM BICARBONATE 8.4 % IV SOLN
100.0000 meq | Freq: Once | INTRAVENOUS | Status: AC
  Filled 2020-07-06: qty 100

## 2020-07-06 MED ORDER — PIPERACILLIN-TAZOBACTAM 3.375 G IVPB
3.3750 g | Freq: Four times a day (QID) | INTRAVENOUS | Status: DC
  Filled 2020-07-06: qty 50

## 2020-07-06 MED ORDER — ORAL CARE MOUTH RINSE
15.0000 mL | OROMUCOSAL | Status: DC
  Administered 2020-07-06 – 2020-07-13 (×72): 15 mL via OROMUCOSAL

## 2020-07-06 MED ORDER — SODIUM BICARBONATE 8.4 % IV SOLN
INTRAVENOUS | Status: AC
  Administered 2020-07-06: 100 meq via INTRAVENOUS
  Filled 2020-07-06: qty 100

## 2020-07-06 MED ORDER — PIPERACILLIN-TAZOBACTAM 3.375 G IVPB
3.3750 g | Freq: Four times a day (QID) | INTRAVENOUS | Status: DC
  Administered 2020-07-06 – 2020-07-07 (×4): 3.375 g via INTRAVENOUS
  Filled 2020-07-06 (×5): qty 50

## 2020-07-06 MED ORDER — FENTANYL CITRATE (PF) 100 MCG/2ML IJ SOLN
25.0000 ug | INTRAMUSCULAR | Status: DC | PRN
  Administered 2020-07-06 (×5): 100 ug via INTRAVENOUS
  Administered 2020-07-08: 50 ug via INTRAVENOUS
  Administered 2020-07-08: 100 ug via INTRAVENOUS
  Administered 2020-07-08 – 2020-07-09 (×4): 25 ug via INTRAVENOUS
  Administered 2020-07-09 – 2020-07-10 (×2): 50 ug via INTRAVENOUS
  Administered 2020-07-10: 100 ug via INTRAVENOUS
  Administered 2020-07-10: 50 ug via INTRAVENOUS
  Administered 2020-07-10: 100 ug via INTRAVENOUS
  Administered 2020-07-10: 50 ug via INTRAVENOUS
  Administered 2020-07-11: 100 ug via INTRAVENOUS
  Filled 2020-07-06 (×21): qty 2

## 2020-07-06 MED ORDER — DEXTROSE 50 % IV SOLN
25.0000 g | INTRAVENOUS | Status: AC
  Administered 2020-07-06: 25 g via INTRAVENOUS

## 2020-07-06 MED ORDER — CHLORHEXIDINE GLUCONATE 0.12% ORAL RINSE (MEDLINE KIT)
15.0000 mL | Freq: Two times a day (BID) | OROMUCOSAL | Status: DC
  Administered 2020-07-06 – 2020-07-13 (×16): 15 mL via OROMUCOSAL

## 2020-07-06 NOTE — Progress Notes (Addendum)
NAME:  Sarah Harmon, MRN:  [id number], DOB:  1946-05-10, LOS: 3 ADMISSION DATE:  06/30/2020, CONSULTATION DATE:  07/04/2020 REFERRING MD: Kayleen Memos, DO CHIEF COMPLAINT:  Hemorrhagic shock   Brief History   PCCM consulted for admission to ICU after complicated cholecystectomy. Patient had Abnormal blood loss and needing massive transfusion.   History of present illness   74 year old female who presented to ED 7/17 with abdominal pain. Found to have choledocholithiasis and acute cholecystitis. Also noted to have lesion along gallbladder wall. GI attempted ERCP on 7/18, but noted to be unsuccessful secondary to distorted anatomy. Surgery and GI discussed case and plan made for patient to have lap cholecystectomy with  plans for intraoperative operative cholangiogram. Surgery complicated by abnormal bleeding during procedure and patient was hypotensive. Surgery stopped early and drains placed.   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 Decreased sensitivity and specificity exam due to technique related factors, as described above. 2. Choledocholithiasis, with a 1.3 cm distal common duct stone. 3. Gallbladder distension with mild pericholecystic edema. Findings are suspicious for acute cholecystitis. 4. Soft tissue signal within the gallbladder lumen is similar to on the prior exam. 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 5. Cirrhosis and segment 8 ablation defect, suboptimally evaluated without gross  recurrent or metachronous hepatocellular carcinoma. 6. Hemosiderosis. 7. Trace abdominal ascites. 8. Similar upper pole left renal complex cystic lesion, incompletely characterized.   Micro Data:  7/18 blood>>   Antimicrobials:  Zosyn 7/17 >>  Interim history/subjective:  Improved with transfusions and intubation overnight.  Objective   Blood pressure (!) 114/56, pulse 70, temperature (!) 96.4 F (35.8 C), temperature source Axillary, resp. rate (!) 23, height 5\' 3"  (1.6 m), weight 65.6 kg, SpO2 100 %.    Vent Mode: PRVC FiO2 (%):  [40 %] 40 % Set Rate:  [26 bmp-30 bmp] 26 bmp Vt Set:  [420 mL] 420 mL PEEP:  [5 cmH20] 5 cmH20 Plateau Pressure:  [32 cmH20] 32 cmH20   Intake/Output Summary (Last 24 hours) at 06/18/2020 0742 Last data filed at 07/08/2020 0700 Gross per 24 hour  Intake 8173.04 ml  Output 3230 ml  Net 4943.04 ml   Filed Weights   06/18/2020 0457 07/16/2020 0500  Weight: 52.4 kg 65.6 kg    Examination: General: thin, frail appearing elderly woman laying in bed in NAD, intubated not sedated HENT: [place]/AT, eyes anicteric Lungs: CTAB, breathing comfortably over the vent Cardiovascular: RRR, NSR on tele Abdomen: soft, NT, bloody output from JP drain, minimal blood on bandage Extremities: no cyanosis or edema, minimal muscle mass Neuro: opening eyes to voice, moving extremities on commands. RASS 0   Resolved Hospital Problem list     Assessment & Plan:  Choledocholithiasis with acute cholecystitis status post attempted lap chole complicated  Intraoperative acute blood loss and hypotension- blood loss from diffuse oozing  -con't zosyn; needs to remain until definitive treatment to remove CBD stones  -con't monitoring drain output -appreciate surgery's input -con't transfusions as required  Shock- multifactorial- likely hemorrhagic, septic. Now volume resuscitated with significantly  decreased pressor requirements.  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; currently decompensated due to critical illness, hypoperfusion Coagulopathy  -con't to monitor -supportive care  Lactic acidosis due to shock; delayed clearance likely  -ongoing volume resuscitation and monitoring bleeding -con't to monitor  Acute on chronic anemia due to acute blood loss and ESRD Chronic thrombocytopenia due to cirrhosis Coagulopathy 2/2 cirrhosis, consumptive from bleeding -keep platelets >50 to minimize bleeding -transfuse to Hb >7 and hemodynamic stability -monitor for additional bleeding  Hx of HTN On review of home meds patient takes amlodipine 10 and hydralazine 100 TID -holding PTA antihypertensives given shock  Hypoglycemia likely due to cirrhosis and acute illness -d5W -thiamine -start trickle tube feeds; advance tomorrow as tolerated  Acute encephalopathy- resolved this morning - delirium precautions -may need lactulose  Best practice:  Diet: trickle TF Pain/Anxiety/Delirium protocol (if indicated): yes VAP protocol (if indicated): yes DVT prophylaxis: SCDs GI prophylaxis: pantoprazole Glucose control:  Mobility: Bed rest Code Status: Full Family Communication: updated overnight Disposition: ICU  Labs   CBC: Recent Labs  Lab 07/07/2020 1506 06/17/2020 1506 07/15/2020 0026 06/22/2020 0026 07/08/2020 0628 06/23/2020 1744 07/09/2020 2034 07/12/2020 2057 07/08/2020 2344 07/07/2020 0356 07/07/2020 0519  WBC 9.3  --  7.5  --  4.0  --  9.8  --   --   --  6.8  NEUTROABS  --   --   6.0  --   --   --  8.3*  --   --   --   --   HGB 9.4*   < > 8.4*   < > 7.7*   < > 10.6* 9.5* 6.8* 6.5* 8.3*  HCT 28.8*   < > 25.6*   < > 23.5*   < > 32.9* 28.0* 20.0* 19.0* 24.8*  MCV 89.2  --  91.1  --  89.0  --  93.2  --   --   --  90.2  PLT 107*  --  99*  --  78*  --  79*  --   --   --  16*   < > = values in this interval not displayed.    Basic Metabolic Panel: Recent Labs  Lab 07/08/2020 1506 06/20/2020 1506 06/23/2020 0026 07/01/2020 0026 06/22/2020 1610 06/17/2020 9604 07/04/2020 2034 07/10/2020 2057 07/03/2020 2344 07/12/2020 0356 06/29/2020 0519  NA 133*   < > 129*   < > 130*   < > 133* 137 138 146* 144  K 3.8   < > 3.9   < > 3.8   < > 6.1* 5.5* 5.4* 3.2* 3.5  CL 93*  --  91*  --  91*  --  104  --   --   --  101  CO2 27  --  26  --  23  --  13*  --   --   --  15*  GLUCOSE 101*  --  93  --  73  --  112*  --   --   --  54*  BUN 12  --  20  --  38*  --  34*  --   --   --  33*  CREATININE 3.53*  --  4.49*  --  7.12*  --  5.82*  --   --   --  6.30*  CALCIUM 8.9  --  8.8*  --  8.3*  --  6.8*  --   --   --  9.2  MG  --   --  1.8  --   --   --  1.8  --   --   --   --    < > = values in this interval not displayed.   GFR: Estimated Creatinine Clearance: 7 mL/min (A) (by C-G formula based on SCr of 6.3 mg/dL (H)). Recent Labs  Lab 06/17/2020 0026 07/11/2020 0628 06/22/2020 2034 07/04/2020 2037 06/20/2020 0519  WBC 7.5 4.0 9.8  --  6.8  LATICACIDVEN  --   --   --  7.9* >11.0*    Liver Function Tests: Recent Labs  Lab 06/24/2020 1506 06/20/2020 0026 06/27/2020 0628 07/08/2020 0519  AST 169* 132* 92* 2,666*  ALT 116* 93* 63* 1,035*  ALKPHOS 147* 141* 147* 63  BILITOT 5.0* 5.1* 5.4* 6.8*  PROT 7.3 6.7 6.2* 5.2*  ALBUMIN 2.9* 2.5* 2.3* 2.5*   Recent Labs  Lab 06/28/2020 1506  LIPASE 34   No results for input(s): AMMONIA in the last 168 hours.  ABG    Component Value Date/Time   PHART 7.491 (H) 07/12/2020 0356   PCO2ART 19.0 (LL) 07/04/2020 0356   PO2ART 80 (L) 06/29/2020 0356   HCO3 15.0 (L)  06/18/2020 0356   TCO2 16 (L) 07/09/2020 0356   ACIDBASEDEF 8.0 (H) 07/05/2020 0356   O2SAT 98.0 07/05/2020 0356     Coagulation Profile: Recent Labs  Lab 07/08/2020 0026 07/11/2020 1744 06/20/2020 2046 07/11/2020 0628  INR 1.2 1.6* 2.0* 2.1*    Cardiac Enzymes: No results for input(s): CKTOTAL, CKMB, CKMBINDEX, TROPONINI in the last 168 hours.  HbA1C: Hgb A1c MFr Bld  Date/Time Value Ref Range Status  12/01/2019 09:40 AM 4.4 (L) 4.6 - 6.5 % Final    Comment:    Glycemic Control Guidelines for People with Diabetes:Non Diabetic:  <6%Goal of Therapy: <7%Additional Action Suggested:  >8%   06/06/2019 12:25 PM 5.1 4.6 - 6.5 % Final    Comment:    Glycemic Control Guidelines for People with Diabetes:Non Diabetic:  <6%Goal of Therapy: <7%Additional Action Suggested:  >8%     CBG: Recent Labs  Lab 07/05/20 1939 07/06/20 0654  GLUCAP 111* 25*    This patient is critically ill with multiple organ system failure which requires frequent high complexity decision making, assessment, support, evaluation, and titration of therapies. This was completed through the application of advanced monitoring technologies and extensive interpretation of multiple databases. During this encounter critical care time was devoted to patient care services described in this note for 45 minutes.  Julian Hy, DO 07/06/20 8:14 AM [facility]

## 2020-07-06 NOTE — Progress Notes (Signed)
PCCM INTERVAL PROGRESS NOTE  Despite high-dose vasopressors, bicarb pushes, and multiple blood transfusions including packed cells, plasma, platelets, and cryoprecipitate patient continues to deteriorate. She is requiring escalating doses of vasopressors. She has now become unresponsive. She has no cough or gag.  I had a long discussion with family and portrayed to them dismal prognosis. The patient will require mechanical ventilation if ongoing aggressive measures are desired. Family understands this may not change her overall outcome and quite honestly may only by her few extra hours if we cannot stop the underlying problem which is felt to be intra-abdominal hemorrhage at this time. With this information, they have elected to proceed with intubation, however, in the event of cardiac arrest they would like Ms. Vint to be  DNR.   Hemorrhagic shock Acute metabolic encephalopathy Hypercarbic respiratory failure, unable to compensate from profound metabolic acidosis.  -Intubate  -Full vent support  -Fentanyl for RASS score 0  -She has 1 unit of packed cells and 1 unit of platelets remaining to be administered  -Follow H&H posttransfusion  -Continue Levophed, vasopressin, phenylephrine  Critical care time 45 minutes.    Georgann Housekeeper, AGACNP-BC [place]  See Amion for personal pager PCCM on call pager [phone]  07/12/2020 1:58 AM

## 2020-07-06 NOTE — Progress Notes (Signed)
[facility] Progress Note Patient Name: Sarah Harmon DOB: 04-02-1946 MRN: [id number]   Date of Service  06/23/2020  HPI/Events of Note  Anemia - Hgb = 6.3.   eICU Interventions  Plan: 1. Transfuse 1 unit PRBC now.      Intervention Category Major Interventions: Other:  Lysle Dingwall 07/12/2020, 9:27 PM

## 2020-07-06 NOTE — Progress Notes (Signed)
RT attempted SBT CPAP/PS with pt this am. Pt extremely agitated. RR increased into the 40's on CPAP/PS as well. Pt returned to full support. RT will continue to monitor.

## 2020-07-06 NOTE — Progress Notes (Signed)
Nutrition Follow-up  DOCUMENTATION CODES:   Non-severe (moderate) malnutrition in context of chronic illness  INTERVENTION:   Trickle tube feeds per CCM: - Vital High Protein @ 10 ml/hr (240 ml/day)  Trickle tube feeding regimen provides 240 kcal, 21 grams of protein, and 201 ml of H2O.   Goal tube feeding recommendations: - Vital High Protein @ 55 ml/hr (1320 ml/day)  Recommended tube feeding regimen provides 1320 kcal, 116 grams of protein, and 1104 ml of H2O.   NUTRITION DIAGNOSIS:   Moderate Malnutrition related to chronic illness (ESRD, cirrhosis) as evidenced by moderate fat depletion, moderate muscle depletion, severe muscle depletion.  New diagnosis after completion of NFPE  GOAL:   Provide needs based on ASPEN/SCCM guidelines  Progressing  MONITOR:   Vent status, Labs, Weight trends, TF tolerance, Skin, I & O's  REASON FOR ASSESSMENT:   Ventilator    ASSESSMENT:   74 year old female with past medical history of hypertension, hyperlipidemia, depression, anxiety, hepatocellular carcinoma (dx 02/2015 S/P thermoablation), ESRD on HD, alcoholic liver cirrhosis presents with severe abdominal pain. 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  7/17 - s/p MRCP 7/18 - ERCP unsuccessful 7/19 - s/p aborted laparoscopic cholecystectomy with placement of percutaneous cholecystostomy tube, surgery stopped early due to intraoperative hypotension and blood loss, s/p massive transfusion 7/20 - intubated, trickle TF initiated  Pt now volume resuscitated with significantly decreased pressor requirements. HD attempted today but pt not tolerating. Plan is for CRRT.  Per Surgery note, CBD still obstructed. Once pt more stable, plan for IR consult for percutaneous stenting procedure to drain bile duct.  OG tube in  place. CCM has ordered trickle feeds at 10 ml/hr. RD will leave TF recommendations.  EDW: 52.4 kg (admit weight)  Patient is currently intubated on ventilator support MV: 12.5 L/min Temp (24hrs), Avg:94.4 F (34.7 C), Min:91.2 F (32.9 C), Max:98 F (36.7 C) BP (a-line): 90/73 MAP (a-line): 127  Drips: D5: 20 ml/hr Levophed: 9.4 ml/hr Phenylephrine: off this AM Vasopressin: 12 ml/hr  Medications reviewed and include: colace, doxercalciferol, lactulose, rena-vit, protonix, miralax, thiamine, IV calcium gluconate 1 gram once, IV abx  Labs reviewed: ionized calcium 0.83, elevated LFTs, lactic acid >11.0, hemoglobin 8.3, platelets 16 CBG's: 25-136 x 24 hours  Abdominal drain 1 (right, lateral): 85 ml x 24 hours Abdominal drain 2 (JP, right, medial): 1945 ml x 24 hours I/O's: +6.6 L since admit  NUTRITION - FOCUSED PHYSICAL EXAM:    Most Recent Value  Orbital Region Moderate depletion  Upper Arm Region Moderate depletion  Thoracic and Lumbar Region Moderate depletion  Buccal Region Unable to assess  Temple Region Moderate depletion  Clavicle Bone Region Moderate depletion  Clavicle and Acromion Bone Region Severe depletion  Scapular Bone Region Unable to assess  Dorsal Hand Moderate depletion  Patellar Region Moderate depletion  Anterior Thigh Region Severe depletion  Posterior Calf Region Moderate depletion  Edema (RD Assessment) None  Hair Reviewed  Eyes Unable to assess  Mouth Unable to assess  Skin Reviewed  Nails Reviewed       Diet Order:   Diet Order            Diet NPO time specified  Diet effective now                 EDUCATION NEEDS:   Not appropriate for education at this time  Skin:  Skin Assessment: Skin Integrity Issues: Incisions: closed abdomen  Last BM:  06/30/2020  Height:   Ht Readings from Last 1 Encounters:  07/02/2020 5\' 3"  (1.6 m)    Weight:   Wt Readings from Last 1 Encounters:  07/07/2020 65.6 kg    Ideal Body Weight:   52.3 kg  BMI:  Body mass index is 25.62 kg/m.  Estimated Nutritional Needs:   Kcal:  1308  Protein:  95-115 grams  Fluid:  >/= 1.5 L    Gaynell Face, MS, RD, LDN Inpatient Clinical Dietitian Please see AMiON for contact information.

## 2020-07-06 NOTE — Progress Notes (Signed)
Patient with increase abdominal distention, MD notified and Resident at the bedside to assess. Orders received for abdominal CT.

## 2020-07-06 NOTE — Progress Notes (Signed)
Patient not tolerating oral contrast, patient vomiting, Zofran given. MD  And CT tech made aware.

## 2020-07-06 NOTE — Progress Notes (Signed)
[facility] Progress Note Patient Name: Sarah Harmon DOB: 1946/03/11 MRN: [id number]   Date of Service  07/07/2020  HPI/Events of Note  Confusion about CT abdomen pelvis orders. Did we want the study wo or w and wo contrast? Notes from today shed no light on which study was desired.   eICU Interventions  Will change study to w oral contrast only since I have been told that the patient has already received oral contrast.      Intervention Category Major Interventions: Other:  Lysle Dingwall 06/23/2020, 11:43 PM

## 2020-07-06 NOTE — Progress Notes (Signed)
CRITICAL VALUE ALERT  Critical Value:  CBG 26 finger stick and 29 ART lline   Date & Time Notied:  06/18/2020 1120  Provider Notified: Yes at the bedside   Orders Received/Actions taken:started D5  mainitance until tube feeds start and amp D50

## 2020-07-06 NOTE — Progress Notes (Signed)
[facility] Progress Note Patient Name: Sarah Harmon DOB: May 24, 1946 MRN: [id number]   Date of Service  06/24/2020  HPI/Events of Note  Hypoglycemia - Blood glucose = 25. Already given D50.  eICU Interventions  Plan: 1. D/C D5W at 20 mL/hour. 2. D10W IV infusion to run at 40 mL/hour.     Intervention Category Major Interventions: Other:  Vi Whitesel Cornelia Copa 06/30/2020, 9:07 PM

## 2020-07-06 NOTE — Progress Notes (Signed)
1 Day Post-Op   Subjective/Chief Complaint: Patient had a very difficult night, but is significantly improved today Awake, alert on vent Received large amount of transfusion of blood products - latest Hgb 8.3, platelets only 16, INR 2.1 LFT's significantly elevated - decompensated hepatic function d/t cirrhosis T. Bili up to 6.8   Objective: Vital signs in last 24 hours: Temp:  [91.2 F (32.9 C)-98.2 F (36.8 C)] 96.4 F (35.8 C) (07/20 0700) Pulse Rate:  [31-81] 70 (07/20 0643) Resp:  [10-36] 23 (07/20 0643) BP: (55-146)/(28-72) 114/56 (07/20 0643) SpO2:  [97 %-100 %] 100 % (07/20 3903) Arterial Line BP: (54-170)/(23-58) 91/41 (07/20 0643) FiO2 (%):  [40 %] 40 % (07/20 0401) Weight:  [65.6 kg] 65.6 kg (07/20 0500) Last BM Date: 06/27/2020  Intake/Output from previous day: 07/19 0701 - 07/20 0700 In: 8173 [I.V.:2133.7; Blood:5257.5; IV Piggyback:781.9] Out: 3230 [Drains:2030; Blood:1200] Intake/Output this shift: No intake/output data recorded.  Constitutional:  Intubated, sedated interactive HEENT - ETT, OG tube Neck - central line in place on left Abd - tender, dry dressings JP drain - bloody discharge Foley - cholecystostomy tube - minimal output  Lab Results:  Recent Labs    06/25/2020 2034 07/02/2020 2057 06/29/2020 0356 06/19/2020 0519  WBC 9.8  --   --  6.8  HGB 10.6*   < > 6.5* 8.3*  HCT 32.9*   < > 19.0* 24.8*  PLT 79*  --   --  16*   < > = values in this interval not displayed.   BMET Recent Labs    06/28/2020 2034 06/27/2020 2057 06/27/2020 0356 06/21/2020 0519  NA 133*   < > 146* 144  K 6.1*   < > 3.2* 3.5  CL 104  --   --  101  CO2 13*  --   --  15*  GLUCOSE 112*  --   --  54*  BUN 34*  --   --  33*  CREATININE 5.82*  --   --  6.30*  CALCIUM 6.8*  --   --  9.2   < > = values in this interval not displayed.   Hepatic Function Latest Ref Rng & Units 06/23/2020 07/02/2020 06/26/2020  Total Protein 6.5 - 8.1 g/dL 5.2(L) 6.2(L) 6.7  Albumin 3.5 - 5.0 g/dL  2.5(L) 2.3(L) 2.5(L)  AST 15 - 41 U/L 2,666(H) 92(H) 132(H)  ALT 0 - 44 U/L 1,035(H) 63(H) 93(H)  Alk Phosphatase 38 - 126 U/L 63 147(H) 141(H)  Total Bilirubin 0.3 - 1.2 mg/dL 6.8(H) 5.4(H) 5.1(H)  Bilirubin, Direct 0.0 - 0.3 mg/dL - - -    PT/INR Recent Labs    06/17/2020 2046 07/01/2020 0628  LABPROT 22.2* 23.1*  INR 2.0* 2.1*   ABG Recent Labs    06/24/2020 2344 06/26/2020 0356  PHART 7.249* 7.491*  HCO3 15.0* 15.0*    Studies/Results: DG CHEST PORT 1 VIEW  Result Date: 06/22/2020 CLINICAL DATA:  Intubation EXAM: PORTABLE CHEST 1 VIEW COMPARISON:  None. FINDINGS: Support Apparatus: --Endotracheal tube: Tip at the level of the clavicular heads. --Enteric tube:Coiled in the distal esophagus --Catheter(s):Left IJ approach central venous catheter tip is likely in the brachiocephalic vein. --Other: None The heart size and mediastinal contours are within normal limits. The lungs are clear. No pleural effusion or pneumothorax. IMPRESSION: 1. Endotracheal tube tip at the level of the clavicular heads. 2. Enteric tube coiled in the distal esophagus. Recommend removal and replacement. These results will be called to the ordering clinician or representative  by the Radiologist Assistant, and communication documented in the PACS or [employer]. Electronically Signed   By: Ulyses Jarred M.D.   On: 06/20/2020 02:18   DG CHEST PORT 1 VIEW  Result Date: 07/14/2020 CLINICAL DATA:  Follow-up ERCP EXAM: PORTABLE CHEST 1 VIEW COMPARISON:  07/19/2018 FINDINGS: There is a left IJ catheter with tip in the expected location of the left innominate vein. The metallic stent graft is identified within the expected location of the left innominate vein is well. Normal heart size. Aortic atherosclerosis. No pleural effusion or edema. No airspace densities. IMPRESSION: No acute cardiopulmonary abnormalities. Electronically Signed   By: Kerby Moors M.D.   On: 07/12/2020 18:34   DG C-Arm 1-60 Min-No  Report  Result Date: 06/29/2020 Fluoroscopy was utilized by the requesting physician.  No radiographic interpretation.    Anti-infectives: Anti-infectives (From admission, onward)   Start     Dose/Rate Route Frequency Ordered Stop   06/27/2020 1200  piperacillin-tazobactam (ZOSYN) IVPB 3.375 g     Discontinue     3.375 g 100 mL/hr over 30 Minutes Intravenous Every 6 hours 06/30/2020 0652     06/26/2020 0430  piperacillin-tazobactam (ZOSYN) IVPB 2.25 g  Status:  Discontinued        2.25 g 100 mL/hr over 30 Minutes Intravenous Every 8 hours 06/22/2020 2147 06/18/2020 0652   07/08/2020 2000  piperacillin-tazobactam (ZOSYN) IVPB 3.375 g        3.375 g 12.5 mL/hr over 240 Minutes Intravenous  Once 06/18/2020 1957 07/14/2020 2328      Assessment/Plan: ESRD on HD T/Th/Sat Hepatocellular carcinoma(dx 02/2015 S/P thermoablation follows with Dr. 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, and cirrhosis with trace abdominal ascites - S/p unsuccessful ERCP 7/18: due to distorted anatomy (very dilated gallbladder and cystic duct) no papillary orifice was seen and attempts at cholangiogram were failed; appears to be a very distal stone -Laparoscopic cholecystectomy 7/19 - aborted due to diffuse oozing and hypotension; cholecystostomy tube placed/ drain placed - Significant hepatorenal decompensation - profuse bleeding - now improved after large amount of transfusion; off pressors this morning - appreciate CCM's hard work over night  ID - zosyn 7/17>> VTE - SCDs, sq heparin FEN - NPO Foley - none Follow up - TBD  Plan: Continue cholecystostomy  CBD still obstructed - once patient more stable will need to see if IR can offer  a percutaneous stenting procedure to drain the bile duct.   LOS: 3 days    Maia Petties 06/27/2020

## 2020-07-06 NOTE — Progress Notes (Addendum)
[facility] Progress Note  Name: Sarah Harmon MRN: [id number] DOB: 15-Mar-1946  Chief Complaint:  abd pain   Subjective:  She was transferred to the ICU from PACU.  Received multiple units of PRBC's.  Later intubated.  She was initiated on pressors.  ERCP had been 7/18 unsuccessful due to anatomy and cholecystectomy with transcystic wire placement was then planned.  However she is now s/p aborted lap chole complicated by intraoperative hypotension and blood loss with coagulopathy as above.  She has been on levo at 8 this am and vaso. Off of neo.  She's had 9 units of blood.  Review of systems:  Unable to obtain 2/2 intubated   Intake/Output Summary (Last 24 hours) at 07/11/2020 0627 Last data filed at 06/29/2020 0600 Gross per 24 hour  Intake 8173.04 ml  Output 3230 ml  Net 4943.04 ml    Vitals:  Vitals:   06/17/2020 0415 07/02/2020 0500 06/25/2020 0600 07/07/2020 0615  BP:  104/65 (!) 114/56 (!) 114/36  Pulse: 69 66 70 74  Resp: (!) 26 (!) 26 (!) 26 (!) 24  Temp: (!) 93.9 F (34.4 C)  (!) 94.1 F (34.5 C) (!) 94.1 F (34.5 C)  TempSrc: Axillary  Rectal Rectal  SpO2: 100% 100% 100% 100%  Weight:  65.6 kg    Height:         Physical Exam:  General adult female in bed critically ill  HEENT normocephalic atraumatic  Lungs clear but reduced 40/5 FIO2 and PEEP Heart S1S2 no rub Abdomen drains in place, distended abd  Extremities no edema appreciated. avf as below  Neuro - no sedation running; getting intermittent per nursing Psych some agitation nursing redirecting  Access LUE AVF bruit and thrill    Medications reviewed    Labs:  BMP Latest Ref Rng & Units 06/28/2020 06/25/2020 06/30/2020  Glucose 70 - 99 mg/dL 54(L) - -  BUN 8 - 23 mg/dL 33(H) - -  Creatinine 0.44 - 1.00 mg/dL 6.30(H) - -  Sodium 135 - 145 mmol/L 144 146(H) 138  Potassium 3.5 - 5.1 mmol/L 3.5 3.2(L) 5.4(H)  Chloride 98 - 111 mmol/L 101 - -  CO2 22 - 32 mmol/L 15(L) - -  Calcium 8.9 -  10.3 mg/dL 9.2 - -     Assessment/Plan:   # Shock - acute blood loss and septic.  Coagulopathy per critical care.    # Choledocolithiasis with acute cholecystitis - abx per primary.  S/p surgery   # ESRD - contacted pulm to request access for CRRT.  Start CRRT today.  UF goal positive to keep even as tolerated.  no heparin noting platelets   # Metabolic acidosis - starting CRRT   # AMS/encephalopathy - intubated for airway protection.  mechanical ventilation per pulm   # Anemia - acute blood loss and ESRD.  S/p multiple PRBC's.  Last ESA on 7/13 - 50 mcg - may need sooner/higher dose  # Metabolic bone disease continue VDRA for now    Claudia Desanctis, MD 07/09/2020  6:48 AM  Spoke with pulm.  They would like to try HD on the levo at 8 now off of vaso.  No access in place for CRRT.  Will try HD today.    Claudia Desanctis 7:52 AM 07/16/2020

## 2020-07-06 NOTE — Progress Notes (Signed)
CRITICAL VALUE ALERT  Critical Value:  CBG 51 finger stick and 52 from Art line to cinfirm  Date & Time Notied:  06/26/2020  Provider Notified: Carlis Abbott  Orders Received/Actions taken: Continue D5 and 1 amp of D50

## 2020-07-06 NOTE — Procedures (Signed)
Intubation Procedure Note  Sarah Harmon  [id number]  November 04, 1946  Date:07/08/2020  Time:2:02 AM   Provider Performing:Akire Rennert W Heber [place]    Procedure: Intubation (35825)  Indication(s) Respiratory Failure  Consent Risks of the procedure as well as the alternatives and risks of each were explained to the patient and/or caregiver.  Consent for the procedure was obtained and is signed in the bedside chart   Anesthesia None   Time Out Verified patient identification, verified procedure, site/side was marked, verified correct patient position, special equipment/implants available, medications/allergies/relevant history reviewed, required imaging and test results available.   Sterile Technique Usual hand hygeine, masks, and gloves were used   Procedure Description Patient positioned in bed supine.  Sedation given as noted above.  Patient was intubated with endotracheal tube using Glidescope.  View was Grade 1 full glottis .  Number of attempts was 1.  Colorimetric CO2 detector was consistent with tracheal placement.   Complications/Tolerance None; patient tolerated the procedure well. Chest X-ray is ordered to verify placement.   EBL None   Georgann Housekeeper, AGACNP-BC [place] for personal pager PCCM on call pager [phone]  07/09/2020 2:03 AM

## 2020-07-06 NOTE — Anesthesia Postprocedure Evaluation (Signed)
Anesthesia Post Note  Patient: Sarah Harmon  Procedure(s) Performed: ATTEMPTED LAPAROSCOPIC CHOLECYSTECTOMY, PLACEMENT OF DRAINS (N/A Abdomen)     Patient location during evaluation: PACU Anesthesia Type: General Level of consciousness: lethargic Pain management: pain level controlled Vital Signs Assessment: vitals unstable Respiratory status: spontaneous breathing, nonlabored ventilation, respiratory function stable and patient connected to nasal cannula oxygen Cardiovascular status: unstable Postop Assessment: no apparent nausea or vomiting Anesthetic complications: no Comments: Patient lost 1200cc blood in OR. I was not informed of this until after attempting to use vasopressors to increase BP intraoperatively. When blood loss was announced, decision was made to abort case, wake patient up and transfuse to get H/H to adequate level to complete surgery. Patient was also noted to be extremely "oozy" during the case, of which I also was not informed until hemodynamic decompensation.  Taken to PACU, BP labile, central line placed by Dr. Tobias Alexander and care turned over to [facility]. Patient was apparently in fulminant hepatic failure.    No complications documented.  Last Vitals:  Vitals:   07/07/2020 0640 07/12/2020 0643  BP:  (!) 114/56  Pulse: 72 70  Resp: (!) 21 (!) 23  Temp: (!) 35.5 C (!) 35.5 C  SpO2: 97% 100%    Last Pain:  Vitals:   06/17/2020 0643  TempSrc: Axillary  PainSc:                  Iren Whipp S

## 2020-07-06 NOTE — Progress Notes (Addendum)
Pharmacy Antibiotic Note  Sarah Harmon is a 74 y.o. female admitted on 07/02/2020 with choledocholithiasis and acute cholecystitis. Who underwent ERCP on 7/18 but was unsuccessful due to distorted anatomy. On 7/19 patient planned to have lap cholecystectomy with intraoperative cholangiogram but during procedure patient developed abnormal bleeding and became hypotensive requiring ICU admission. Pharmacy has been consulted for Zosyn dosing. Patient has been receiving Zosyn 2.25g IV q8h dosed for ESRD on HD. This morning CRRT orders were placed and Zosyn was adjusted to 3.375g IV q6h based on CRRT intended to be started.   Patient is now off norepinephrine and phenylephrine and plan is to trial HD today.   Plan:  Adjust Zosyn to 2.25g IV q8h for HD dosing and monitor toleration of HD   Height: 5\' 3"  (160 cm) Weight: 65.6 kg (144 lb 10 oz) IBW/kg (Calculated) : 52.4  Temp (24hrs), Avg:93 F (33.9 C), Min:91.2 F (32.9 C), Max:98.2 F (36.8 C)  Recent Labs  Lab 07/08/2020 1506 07/12/2020 0026 07/11/2020 0628 06/20/2020 2034 07/12/2020 2037 06/20/2020 0519  WBC 9.3 7.5 4.0 9.8  --  6.8  CREATININE 3.53* 4.49* 7.12* 5.82*  --  6.30*  LATICACIDVEN  --   --   --   --  7.9* >11.0*    Estimated Creatinine Clearance: 7 mL/min (A) (by C-G formula based on SCr of 6.3 mg/dL (H)).    No Known Allergies  Antimicrobials this admission: 7/17 Zosyn >>   Antimicrobial dose adjustments this admission: 7/20: Zosyn adjusted to 3.375g IV q6h for intended start of CRRT (CRRT not started and Zosyn dose not administered) - adjusted back to 2.25g IV q8h   Cultures this admission: 7/18 bcx: ngtd   Thank you for allowing pharmacy to be a part of this patient's care.  Cristela Felt, PharmD Clinical Pharmacist  06/22/2020 8:29 AM  Addendum: Patient underwent trial of HD and did not tolerate HD due to hypotension and patient is now transitioning to CRRT.   Plan:  Adjust Zosyn to 3.375g IV q6h for CRRT

## 2020-07-06 NOTE — Progress Notes (Signed)
CRITICAL VALUE ALERT  Critical Value:  Hb 6.3  Date & Time Notied:  07/17/2020 2125  Provider Notified: Dr. Emmit Alexanders via Dellia Nims, RN  Orders Received/Actions taken: awaiting new orders

## 2020-07-06 NOTE — Plan of Care (Signed)
Not tolerating IHD.  Spoke with critical care and they are placing vascath and we will transition to CRRT.  No UF x 6 hours then transition to net positive 50 ml/hr to keep even as tolerated  Claudia Desanctis, MD 9:54 AM 06/23/2020

## 2020-07-06 NOTE — Procedures (Signed)
Central Venous Catheter Insertion Procedure Note  GABRIELLA WOODHEAD  [id number]  Oct 18, 1946  Date:07/01/2020  Time:2:09 PM   Provider Performing:Pete Johnette Abraham Kary Kos   Procedure: Insertion of Non-tunneled Central Venous Catheter(36556)with US guidance (33354)    Indication(s) Medication administration, Difficult access and Hemodialysis  Consent Risks of the procedure as well as the alternatives and risks of each were explained to the patient and/or caregiver.  Consent for the procedure was obtained and is signed in the bedside chart  Anesthesia Topical only with 1% lidocaine   Timeout Verified patient identification, verified procedure, site/side was marked, verified correct patient position, special equipment/implants available, medications/allergies/relevant history reviewed, required imaging and test results available.  Sterile Technique Maximal sterile technique including full sterile barrier drape, hand hygiene, sterile gown, sterile gloves, mask, hair covering, sterile ultrasound probe cover (if used).  Procedure Description Area of catheter insertion was cleaned with chlorhexidine and draped in sterile fashion.   With real-time ultrasound guidance a HD catheter was placed into the right internal jugular vein.  Nonpulsatile blood flow and easy flushing noted in all ports.  The catheter was sutured in place and sterile dressing applied.  Complications/Tolerance None; patient tolerated the procedure well. Chest X-ray is ordered to verify placement for internal jugular or subclavian cannulation.  Chest x-ray is not ordered for femoral cannulation.  EBL Minimal  Specimen(s) None Erick Colace ACNP-BC [place] Pager # [phone] OR # [phone] if no answer

## 2020-07-06 NOTE — Progress Notes (Signed)
[facility] Progress Note Patient Name: TANICA GAIGE DOB: [birth year] MRN: [id number]   Date of Service  07/12/2020  HPI/Events of Note  Hypokalemia - K+ = 3.5 and Creatinine = 6.30 and 2. Thrombocytopenia - Platelet count = 16. Actively bleeding.   eICU Interventions  Plan: 1. Will not replace K+ d/t ARF. 2. Transfuse 1 unit single donor platelets.      Intervention Category Major Interventions: Electrolyte abnormality - evaluation and management;Other:  Lysle Dingwall 06/20/2020, 6:36 AM

## 2020-07-06 NOTE — Progress Notes (Signed)
Pharmacy Antibiotic Note  Sarah Harmon is a 74 y.o. female admitted on 07/10/2020 with Cholecystitis.  Pharmacy has been consulted for Zosyn dosing.  Pt starting CRRT 7/20 a.m.  Plan:  Change Zosyn to 3.375gm IV q6h (doses over 30 min) Will f/u CRRT tolerance, micro data, and pt's clinical condition   Height: 5\' 3"  (160 cm) Weight: 65.6 kg (144 lb 10 oz) IBW/kg (Calculated) : 52.4  Temp (24hrs), Avg:92.6 F (33.7 C), Min:91.2 F (32.9 C), Max:98.2 F (36.8 C)  Recent Labs  Lab 07/15/2020 1506 07/09/2020 0026 07/16/2020 0628 07/16/2020 2034 06/19/2020 2037 07/03/2020 0519  WBC 9.3 7.5 4.0 9.8  --  6.8  CREATININE 3.53* 4.49* 7.12* 5.82*  --  6.30*  LATICACIDVEN  --   --   --   --  7.9* >11.0*    Estimated Creatinine Clearance: 7 mL/min (A) (by C-G formula based on SCr of 6.3 mg/dL (H)).    No Known Allergies  Antimicrobials this admission: 7/17 Zosyn >>    Thank you for allowing pharmacy to be a part of this patient's care.  Sherlon Handing, PharmD, BCPS Please see amion for complete clinical pharmacist phone list 07/15/2020 6:54 AM

## 2020-07-06 NOTE — Progress Notes (Signed)
Pt placed on HD at 0900 for 3 hour tx. Pt on Levo and Vaso at the time. Twenty mins into tx, pt's SBP into 70's and Levo increased, per order UF turned off if Levo increased. UF remained off and HD continued. HD RN did notify Dr. Royce Macadamia and was ordered to continue with HD no fluid removal. Pt was to receive unit of RBCs, FFP and plasma. Pt on for 45 mins, VS remained low and now Neo being added. Notified Dr. Royce Macadamia on new pressor and was ordered to dc HD and pt to be placed on CRRT. All blood returned without issue. Tx terminated after 45 mins of HD tx

## 2020-07-07 ENCOUNTER — Inpatient Hospital Stay (HOSPITAL_COMMUNITY): Payer: Medicare Other

## 2020-07-07 DIAGNOSIS — R945 Abnormal results of liver function studies: Secondary | ICD-10-CM

## 2020-07-07 DIAGNOSIS — K8042 Calculus of bile duct with acute cholecystitis without obstruction: Secondary | ICD-10-CM | POA: Diagnosis not present

## 2020-07-07 DIAGNOSIS — K7030 Alcoholic cirrhosis of liver without ascites: Secondary | ICD-10-CM | POA: Diagnosis not present

## 2020-07-07 DIAGNOSIS — K7200 Acute and subacute hepatic failure without coma: Secondary | ICD-10-CM

## 2020-07-07 LAB — CBC
HCT: 18 % — ABNORMAL LOW (ref 36.0–46.0)
HCT: 23.4 % — ABNORMAL LOW (ref 36.0–46.0)
HCT: 24.3 % — ABNORMAL LOW (ref 36.0–46.0)
HCT: 24.8 % — ABNORMAL LOW (ref 36.0–46.0)
HCT: 26.9 % — ABNORMAL LOW (ref 36.0–46.0)
HCT: 28.7 % — ABNORMAL LOW (ref 36.0–46.0)
Hemoglobin: 6.2 g/dL — CL (ref 12.0–15.0)
Hemoglobin: 7.7 g/dL — ABNORMAL LOW (ref 12.0–15.0)
Hemoglobin: 8.3 g/dL — ABNORMAL LOW (ref 12.0–15.0)
Hemoglobin: 8.3 g/dL — ABNORMAL LOW (ref 12.0–15.0)
Hemoglobin: 9 g/dL — ABNORMAL LOW (ref 12.0–15.0)
Hemoglobin: 9.5 g/dL — ABNORMAL LOW (ref 12.0–15.0)
MCH: 28.8 pg (ref 26.0–34.0)
MCH: 28.9 pg (ref 26.0–34.0)
MCH: 29 pg (ref 26.0–34.0)
MCH: 29 pg (ref 26.0–34.0)
MCH: 30.1 pg (ref 26.0–34.0)
MCH: 30.2 pg (ref 26.0–34.0)
MCHC: 32.9 g/dL (ref 30.0–36.0)
MCHC: 33.1 g/dL (ref 30.0–36.0)
MCHC: 33.5 g/dL (ref 30.0–36.0)
MCHC: 33.5 g/dL (ref 30.0–36.0)
MCHC: 34.2 g/dL (ref 30.0–36.0)
MCHC: 34.4 g/dL (ref 30.0–36.0)
MCV: 83.7 fL (ref 80.0–100.0)
MCV: 86.8 fL (ref 80.0–100.0)
MCV: 87.5 fL (ref 80.0–100.0)
MCV: 88 fL (ref 80.0–100.0)
MCV: 88 fL (ref 80.0–100.0)
MCV: 90.2 fL (ref 80.0–100.0)
Platelets: 16 10*3/uL — CL (ref 150–400)
Platelets: 187 10*3/uL (ref 150–400)
Platelets: 35 10*3/uL — ABNORMAL LOW (ref 150–400)
Platelets: 45 10*3/uL — ABNORMAL LOW (ref 150–400)
Platelets: 49 10*3/uL — ABNORMAL LOW (ref 150–400)
Platelets: 51 10*3/uL — ABNORMAL LOW (ref 150–400)
RBC: 2.15 MIL/uL — ABNORMAL LOW (ref 3.87–5.11)
RBC: 2.66 MIL/uL — ABNORMAL LOW (ref 3.87–5.11)
RBC: 2.75 MIL/uL — ABNORMAL LOW (ref 3.87–5.11)
RBC: 2.76 MIL/uL — ABNORMAL LOW (ref 3.87–5.11)
RBC: 3.1 MIL/uL — ABNORMAL LOW (ref 3.87–5.11)
RBC: 3.28 MIL/uL — ABNORMAL LOW (ref 3.87–5.11)
RDW: 14 % (ref 11.5–15.5)
RDW: 14.4 % (ref 11.5–15.5)
RDW: 14.4 % (ref 11.5–15.5)
RDW: 14.5 % (ref 11.5–15.5)
RDW: 14.7 % (ref 11.5–15.5)
RDW: 14.7 % (ref 11.5–15.5)
WBC: 11.3 10*3/uL — ABNORMAL HIGH (ref 4.0–10.5)
WBC: 13.3 10*3/uL — ABNORMAL HIGH (ref 4.0–10.5)
WBC: 13.6 10*3/uL — ABNORMAL HIGH (ref 4.0–10.5)
WBC: 13.8 10*3/uL — ABNORMAL HIGH (ref 4.0–10.5)
WBC: 6.8 10*3/uL (ref 4.0–10.5)
WBC: 9 10*3/uL (ref 4.0–10.5)
nRBC: 12.5 % — ABNORMAL HIGH (ref 0.0–0.2)
nRBC: 28 % — ABNORMAL HIGH (ref 0.0–0.2)
nRBC: 3.4 % — ABNORMAL HIGH (ref 0.0–0.2)
nRBC: 46.5 % — ABNORMAL HIGH (ref 0.0–0.2)
nRBC: 7.3 % — ABNORMAL HIGH (ref 0.0–0.2)
nRBC: 9.2 % — ABNORMAL HIGH (ref 0.0–0.2)

## 2020-07-07 LAB — BPAM PLATELET PHERESIS
Blood Product Expiration Date: 202107202359
Blood Product Expiration Date: 202107222359
ISSUE DATE / TIME: 202107200754
ISSUE DATE / TIME: 202107200754
Unit Type and Rh: 6200
Unit Type and Rh: 5100

## 2020-07-07 LAB — RENAL FUNCTION PANEL
Albumin: 2.5 g/dL — ABNORMAL LOW (ref 3.5–5.0)
Anion gap: 32 — ABNORMAL HIGH (ref 5–15)
BUN: 30 mg/dL — ABNORMAL HIGH (ref 8–23)
CO2: 9 mmol/L — ABNORMAL LOW (ref 22–32)
Calcium: 9.7 mg/dL (ref 8.9–10.3)
Chloride: 99 mmol/L (ref 98–111)
Creatinine, Ser: 5.98 mg/dL — ABNORMAL HIGH (ref 0.44–1.00)
GFR calc Af Amer: 7 mL/min — ABNORMAL LOW (ref >60)
GFR calc non Af Amer: 6 mL/min — ABNORMAL LOW (ref >60)
Glucose, Bld: 146 mg/dL — ABNORMAL HIGH (ref 70–99)
Phosphorus: 8.6 mg/dL — ABNORMAL HIGH (ref 2.5–4.6)
Potassium: 4.5 mmol/L (ref 3.5–5.1)
Sodium: 140 mmol/L (ref 135–145)

## 2020-07-07 LAB — BPAM CRYOPRECIPITATE
Blood Product Expiration Date: 202107200041
Blood Product Expiration Date: 202107200041
Blood Product Expiration Date: 202107200625
ISSUE DATE / TIME: 202107192047
ISSUE DATE / TIME: 202107200025
ISSUE DATE / TIME: 202107200107
Unit Type and Rh: 5100
Unit Type and Rh: 5100
Unit Type and Rh: 5100

## 2020-07-07 LAB — GLOBAL TEG PANEL
CFF Max Amplitude: 19.1 mm (ref 15–32)
CK with Heparinase (R): 13.8 min — ABNORMAL HIGH (ref 4.3–8.3)
Citrated Functional Fibrinogen: 348.5 mg/dL (ref 278–581)
Citrated Kaolin (K): >5 min — ABNORMAL HIGH (ref 0.8–2.1)
Citrated Kaolin (MA): <40 mm — ABNORMAL LOW (ref 52–69)
Citrated Kaolin (R): >17 min — ABNORMAL HIGH (ref 4.6–9.1)
Citrated Kaolin Angle: 43.8 deg — ABNORMAL LOW (ref 63–78)
Citrated Rapid TEG (MA): 54.2 mm (ref 52–70)

## 2020-07-07 LAB — PREPARE FRESH FROZEN PLASMA
Unit division: 0
Unit division: 0
Unit division: 0
Unit division: 0
Unit division: 0

## 2020-07-07 LAB — PROTIME-INR
INR: 4.3 (ref 0.8–1.2)
INR: 2.1 — ABNORMAL HIGH (ref 0.8–1.2)
Prothrombin Time: 40.3 seconds — ABNORMAL HIGH (ref 11.4–15.2)
Prothrombin Time: 22.6 seconds — ABNORMAL HIGH (ref 11.4–15.2)

## 2020-07-07 LAB — BPAM FFP
Blood Product Expiration Date: 202107222359
Blood Product Expiration Date: 202107222359
Blood Product Expiration Date: 202107222359
Blood Product Expiration Date: 202107222359
Blood Product Expiration Date: 202107222359
ISSUE DATE / TIME: 202107200000
ISSUE DATE / TIME: 202107200000
ISSUE DATE / TIME: 202107200000
ISSUE DATE / TIME: 202107200954
ISSUE DATE / TIME: 202107201140
Unit Type and Rh: 7300
Unit Type and Rh: 7300
Unit Type and Rh: 7300
Unit Type and Rh: 7300
Unit Type and Rh: 7300

## 2020-07-07 LAB — PHOSPHORUS: Phosphorus: 8.2 mg/dL — ABNORMAL HIGH (ref 2.5–4.6)

## 2020-07-07 LAB — PREPARE CRYOPRECIPITATE
Unit division: 0
Unit division: 0
Unit division: 0

## 2020-07-07 LAB — COMPREHENSIVE METABOLIC PANEL
ALT: 3977 U/L — ABNORMAL HIGH (ref 0–44)
AST: >10000 U/L — ABNORMAL HIGH (ref 15–41)
Albumin: 2.8 g/dL — ABNORMAL LOW (ref 3.5–5.0)
Alkaline Phosphatase: 174 U/L — ABNORMAL HIGH (ref 38–126)
Anion gap: 30 — ABNORMAL HIGH (ref 5–15)
BUN: 30 mg/dL — ABNORMAL HIGH (ref 8–23)
CO2: 11 mmol/L — ABNORMAL LOW (ref 22–32)
Calcium: 9.4 mg/dL (ref 8.9–10.3)
Chloride: 100 mmol/L (ref 98–111)
Creatinine, Ser: 6.08 mg/dL — ABNORMAL HIGH (ref 0.44–1.00)
GFR calc Af Amer: 7 mL/min — ABNORMAL LOW (ref >60)
GFR calc non Af Amer: 6 mL/min — ABNORMAL LOW (ref >60)
Glucose, Bld: 140 mg/dL — ABNORMAL HIGH (ref 70–99)
Potassium: 4.3 mmol/L (ref 3.5–5.1)
Sodium: 141 mmol/L (ref 135–145)
Total Bilirubin: 7.7 mg/dL — ABNORMAL HIGH (ref 0.3–1.2)
Total Protein: 5 g/dL — ABNORMAL LOW (ref 6.5–8.1)

## 2020-07-07 LAB — GLUCOSE, CAPILLARY
Glucose-Capillary: 26 mg/dL — CL (ref 70–99)
Glucose-Capillary: 117 mg/dL — ABNORMAL HIGH (ref 70–99)
Glucose-Capillary: 82 mg/dL (ref 70–99)
Glucose-Capillary: 84 mg/dL (ref 70–99)
Glucose-Capillary: 30 mg/dL — CL (ref 70–99)
Glucose-Capillary: 153 mg/dL — ABNORMAL HIGH (ref 70–99)
Glucose-Capillary: 76 mg/dL (ref 70–99)

## 2020-07-07 LAB — PREPARE PLATELET PHERESIS
Unit division: 0
Unit division: 0

## 2020-07-07 LAB — FIBRINOGEN: Fibrinogen: 253 mg/dL (ref 210–475)

## 2020-07-07 LAB — PREPARE RBC (CROSSMATCH)

## 2020-07-07 LAB — MAGNESIUM: Magnesium: 1.8 mg/dL (ref 1.7–2.4)

## 2020-07-07 MED ORDER — PIPERACILLIN-TAZOBACTAM 3.375 G IVPB
3.3750 g | Freq: Two times a day (BID) | INTRAVENOUS | Status: DC
  Administered 2020-07-07 – 2020-07-08 (×2): 3.375 g via INTRAVENOUS
  Filled 2020-07-07 (×2): qty 50

## 2020-07-07 MED ORDER — ALBUMIN HUMAN 5 % IV SOLN
INTRAVENOUS | Status: AC
  Filled 2020-07-07: qty 250

## 2020-07-07 MED ORDER — ALBUMIN HUMAN 5 % IV SOLN
12.5000 g | Freq: Once | INTRAVENOUS | Status: AC
  Administered 2020-07-07: 12.5 g via INTRAVENOUS
  Filled 2020-07-07: qty 250

## 2020-07-07 MED ORDER — FENTANYL CITRATE (PF) 100 MCG/2ML IJ SOLN
INTRAMUSCULAR | Status: AC | PRN
  Administered 2020-07-07 (×2): 25 ug via INTRAVENOUS
  Administered 2020-07-07 (×2): 50 ug via INTRAVENOUS
  Administered 2020-07-07 (×2): 25 ug via INTRAVENOUS

## 2020-07-07 MED ORDER — SODIUM CHLORIDE 0.9 % IV SOLN
80.0000 mg | Freq: Two times a day (BID) | INTRAVENOUS | Status: DC
  Filled 2020-07-07 (×2): qty 80

## 2020-07-07 MED ORDER — SODIUM CHLORIDE 0.9% IV SOLUTION: Freq: Once | INTRAVENOUS | Status: AC

## 2020-07-07 MED ORDER — ALBUMIN HUMAN 25 % IV SOLN
25.0000 g | Freq: Once | INTRAVENOUS | Status: AC
  Administered 2020-07-07: 25 g via INTRAVENOUS
  Filled 2020-07-07: qty 100

## 2020-07-07 MED ORDER — GELATIN ABSORBABLE 12-7 MM EX MISC
CUTANEOUS | Status: AC
  Filled 2020-07-07: qty 1

## 2020-07-07 MED ORDER — PANTOPRAZOLE SODIUM 40 MG IV SOLR: 40.0000 mg | Freq: Two times a day (BID) | INTRAVENOUS | Status: DC

## 2020-07-07 MED ORDER — FENTANYL CITRATE (PF) 100 MCG/2ML IJ SOLN
INTRAMUSCULAR | Status: AC
  Filled 2020-07-07: qty 2

## 2020-07-07 MED ORDER — CHLORHEXIDINE GLUCONATE CLOTH 2 % EX PADS
6.0000 | MEDICATED_PAD | Freq: Every day | CUTANEOUS | Status: DC
  Administered 2020-07-08 – 2020-07-12 (×6): 6 via TOPICAL

## 2020-07-07 MED ORDER — DEXTROSE 50 % IV SOLN
INTRAVENOUS | Status: AC
  Filled 2020-07-07: qty 50

## 2020-07-07 MED ORDER — PHYTONADIONE 5 MG PO TABS
2.5000 mg | ORAL_TABLET | Freq: Once | ORAL | Status: AC
  Administered 2020-07-07: 2.5 mg
  Filled 2020-07-07: qty 1

## 2020-07-07 MED ORDER — LIDOCAINE HCL 1 % IJ SOLN
INTRAMUSCULAR | Status: AC
  Filled 2020-07-07: qty 20

## 2020-07-07 MED ORDER — IOHEXOL 300 MG/ML  SOLN
150.0000 mL | Freq: Once | INTRAMUSCULAR | Status: AC | PRN
  Administered 2020-07-07: 65 mL via INTRA_ARTERIAL

## 2020-07-07 MED ORDER — DARBEPOETIN ALFA 100 MCG/0.5ML IJ SOSY
100.0000 ug | PREFILLED_SYRINGE | INTRAMUSCULAR | Status: DC
  Administered 2020-07-07: 100 ug via INTRAVENOUS
  Filled 2020-07-07: qty 0.5

## 2020-07-07 MED ORDER — SODIUM CHLORIDE 0.9% IV SOLUTION: Freq: Once | INTRAVENOUS | Status: DC

## 2020-07-07 MED ORDER — MIDAZOLAM HCL 2 MG/2ML IJ SOLN
INTRAMUSCULAR | Status: AC
  Filled 2020-07-07: qty 2

## 2020-07-07 MED ORDER — DEXTROSE 10 % IV SOLN: INTRAVENOUS | Status: DC

## 2020-07-07 MED ORDER — SODIUM CHLORIDE 0.9 % IV SOLN
50.0000 ug/h | INTRAVENOUS | Status: DC
  Administered 2020-07-07 (×2): 50 ug/h via INTRAVENOUS
  Filled 2020-07-07 (×4): qty 1

## 2020-07-07 MED ORDER — SODIUM CHLORIDE 0.9 % IV SOLN
80.0000 mg | Freq: Once | INTRAVENOUS | Status: AC
  Administered 2020-07-07: 13:00:00 80 mg via INTRAVENOUS
  Filled 2020-07-07: qty 80

## 2020-07-07 MED ORDER — IOHEXOL 350 MG/ML SOLN
100.0000 mL | Freq: Once | INTRAVENOUS | Status: AC | PRN
  Administered 2020-07-07: 100 mL via INTRAVENOUS

## 2020-07-07 MED ORDER — MIDAZOLAM HCL 2 MG/2ML IJ SOLN
INTRAMUSCULAR | Status: AC | PRN
  Administered 2020-07-07 (×2): 0.5 mg via INTRAVENOUS
  Administered 2020-07-07: 1 mg via INTRAVENOUS
  Administered 2020-07-07 (×2): 0.5 mg via INTRAVENOUS
  Administered 2020-07-07: 1 mg via INTRAVENOUS

## 2020-07-07 MED ORDER — SODIUM CHLORIDE 0.9 % IV SOLN
8.0000 mg/h | INTRAVENOUS | Status: DC
  Administered 2020-07-07 – 2020-07-08 (×3): 8 mg/h via INTRAVENOUS
  Filled 2020-07-07 (×3): qty 80

## 2020-07-07 MED ORDER — DEXTROSE 50 % IV SOLN
50.0000 mL | Freq: Once | INTRAVENOUS | Status: AC
  Administered 2020-07-07: 50 mL via INTRAVENOUS

## 2020-07-07 MED ORDER — IOHEXOL 300 MG/ML  SOLN
150.0000 mL | Freq: Once | INTRAMUSCULAR | Status: AC | PRN
  Administered 2020-07-07: 12 mL via INTRA_ARTERIAL

## 2020-07-07 MED ORDER — SODIUM CHLORIDE 0.9 % IV SOLN
80.0000 mg | Freq: Two times a day (BID) | INTRAVENOUS | Status: DC
  Filled 2020-07-07: qty 80

## 2020-07-07 MED ORDER — LIDOCAINE HCL 1 % IJ SOLN
INTRAMUSCULAR | Status: AC | PRN
  Administered 2020-07-07: 3 mL

## 2020-07-07 MED ORDER — HEPARIN SODIUM (PORCINE) 1000 UNIT/ML DIALYSIS
1000.0000 [IU] | INTRAMUSCULAR | Status: DC | PRN
  Administered 2020-07-07: 2400 [IU] via INTRAVENOUS_CENTRAL
  Filled 2020-07-07: qty 3
  Filled 2020-07-07 (×2): qty 6

## 2020-07-07 MED ORDER — HEPARIN SODIUM (PORCINE) 1000 UNIT/ML IJ SOLN
INTRAMUSCULAR | Status: AC | PRN
  Administered 2020-07-07 (×2): 1.2 mL

## 2020-07-07 MED ORDER — HEPARIN SODIUM (PORCINE) 1000 UNIT/ML IJ SOLN
INTRAMUSCULAR | Status: AC
  Filled 2020-07-07: qty 1

## 2020-07-07 NOTE — Progress Notes (Signed)
RT NOTE:  Pt transported back from IR without event.

## 2020-07-07 NOTE — Progress Notes (Signed)
Patient transported to the CT and back to 2M13 without any event. RT will continue to monitor.

## 2020-07-07 NOTE — Progress Notes (Signed)
PT Cancellation Note  Patient Details Name: Sarah Harmon MRN: [id number] DOB: September 26, 1946   Cancelled Treatment:    Reason Eval/Treat Not Completed: Patient at procedure or test/unavailable.  Gone recently to imaging and not likely to return before close of this day. 07/07/2020  [name], PT [facility] [phone]  (pager) [phone]  (office)   Sarah Harmon 07/07/2020, 5:24 PM

## 2020-07-07 NOTE — Progress Notes (Signed)
Pt transported on the ventilator from 2M13 to IR with RT, RN and transport. Pt tolerated well. This RT remained with pt. Will continue to monitor.

## 2020-07-07 NOTE — Procedures (Signed)
Pre-procedure Diagnosis: Post operative bleeding Post-procedure Diagnosis: Same  Post mesenteric arteriogram and percutaneous coil embolization of the pancreaticoduodenal artery and GDA and particle embolization of the right hepatic artery.  Access: R CFA; 5 Fr vascular sheath remains in place d/t elevated INR (>4)  Complications: None Immediate EBL: None  Keep right leg straight while vascular sheath remains in place.  Signed: Sandi Mariscal Pager: [phone] 07/07/2020, 7:32 PM

## 2020-07-07 NOTE — Progress Notes (Signed)
Pt transferred to and from CT scan without incident.

## 2020-07-07 NOTE — Sedation Documentation (Signed)
Pt transported on bed with monitor, RN, RT on vent. SBAR given to [place], RN upon arrival. Groin level 0, drsg CDI, 1+RDP.

## 2020-07-07 NOTE — Sedation Documentation (Signed)
No Aldrete score. Pt sedated on ventilator.

## 2020-07-07 NOTE — Progress Notes (Signed)
VAST consulted to obtain IV access for STAT CT scan. To pt's bedside to assess pt: LA with AVF, right arm edematous; Trialysis catheter in right neck (powerport pigtail). Spoke with pt's nurse who stated CT would not use "IJ" or "CL" for injection. Called CT and spoke with Claiborne Billings. Educated that this pt's "IJ" was a central line with the tip in the SVC and was specifically made to have power injections through the purple "power port". Notified pt's nurse that patient can have CT scan completed with the purple lumen of Trialysis catheter.

## 2020-07-07 NOTE — Progress Notes (Addendum)
[facility] Progress Note  Name: SHELBI VACCARO MRN: [id number] DOB: [birth year]  Chief Complaint:  abd pain   Subjective:  Per nursing she was unable to get started on CRRT - they received alert of air in the line on attempted initiation.  Interim CT a/p with hemoperitoneum - surgery has reviewed per their note.  She is now off of pressors.  She received 2 units of blood yesterday, FFP, and platelets.  She has weakly followed commands per nursing.   Review of systems:  Unable to obtain 2/2 intubated   Intake/Output Summary (Last 24 hours) at 07/07/2020 0608 Last data filed at 07/07/2020 0400 Gross per 24 hour  Intake 1980.36 ml  Output -393 ml  Net 2373.36 ml    Vitals:  Vitals:   07/07/20 0515 07/07/20 0530 07/07/20 0545 07/07/20 0600  BP:  (!) 122/41  (!) 102/44  Pulse: 72 72 72 72  Resp: (!) 27 (!) 28 (!) 28 (!) 27  Temp:      TempSrc:      SpO2: 98% 98% 98% 98%  Weight:      Height:         Physical Exam:  General adult female in bed critically ill  HEENT normocephalic atraumatic  Lungs coarse mechanical breath sounds 30/5 FIO2 and PEEP Heart S1S2 no rub Abdomen drains in place, distended abd  Extremities no edema appreciated. avf as below  Neuro - no sedation running.does not wake with exam  Access LUE AVF bruit and thrill; RIJ nontunneled catheter    Medications reviewed    Labs:  BMP Latest Ref Rng & Units 07/07/2020 06/17/2020 07/11/2020  Glucose 70 - 99 mg/dL 146(H) 54(L) -  BUN 8 - 23 mg/dL 30(H) 33(H) -  Creatinine 0.44 - 1.00 mg/dL 5.98(H) 6.30(H) -  Sodium 135 - 145 mmol/L 140 144 146(H)  Potassium 3.5 - 5.1 mmol/L 4.5 3.5 3.2(L)  Chloride 98 - 111 mmol/L 99 101 -  CO2 22 - 32 mmol/L 9(L) 15(L) -  Calcium 8.9 - 10.3 mg/dL 9.7 9.2 -     Assessment/Plan:   # Shock - acute blood loss and septic.  Coagulopathy per critical care.    # Choledocolithiasis with acute cholecystitis - abx per primary.  now s/p aborted lap chole  complicated by intraoperative hypotension and blood loss with coagulopathy   # ESRD - will attempt HD again today.  Note normally TTS as outpatient.  No heparin noting platelets.  Does not appear that RIJ nontunneled catheter is functional - can be removed  # Metabolic acidosis - attempt HD today   # AMS/encephalopathy - intubated for airway protection.  mechanical ventilation per pulm   # Anemia - acute blood loss and ESRD.  S/p multiple PRBC's.  aranesp 100 mcg on 7/21 ordered and for weekly for now  # Metabolic bone disease continue VDRA for now    Claudia Desanctis, MD 07/07/2020  6:30 AM

## 2020-07-07 NOTE — Progress Notes (Signed)
Pt transported on ventilator from 1M 13 to CT 2 and back with RT, RN and transport. No complications during. RT will continue to monitor.

## 2020-07-07 NOTE — Progress Notes (Signed)
RT attempted pt on SBT CPAP/PS. Pt arouses to pain but apneic on CPAP/PS despite stimulation. RT placed pt back on full support at this time. RT will continue to monitor.

## 2020-07-07 NOTE — Progress Notes (Signed)
GI ATTENDING  Operative events, interval history, and laboratories reviewed personally.  Case discussed with Dr. Georgette Dover.  Impression:  1.  Acute cholecystitis.  Complicated surgery with acute intraoperative decline necessitating cholecystostomy tube placement to facilitate gallbladder drainage.  Tube is draining bile. 2.  Underlying cirrhosis 3.  History of [place], treated 4.  Abrupt rise in liver tests postoperatively.  This is most likely shock liver.  It may be more difficult for her to recover from this given significant underlying liver disease.  The treatment, of course, is supportive. 5.  Probable choledocholithiasis on MRCP (distal CBD stone versus distal cystic duct stone with low insertion).  Upstream dilation on imaging 6.  Multiple advanced acute and chronic medical problems  Recommendations: 1.  Continue maximal supportive care measures 2.  When stable, would proceed with contrast study via cholecystostomy tube to assess for cystic duct patency and choledocholithiasis 3.  If obstructive choledocholithiasis, with upstream biliary dilation, consider IR directed percutaneous drainage given ductal issues related to failed ERCP previously.  I have discussed this with Dr. Georgette Dover.  We will follow from a distance, but are available at any time.  Thanks.  Docia Chuck. Geri Seminole., M.D. [facility] Division of Gastroenterology

## 2020-07-07 NOTE — Progress Notes (Signed)
[facility] Progress Note Patient Name: Sarah Harmon DOB: 1946/12/15 MRN: [id number]   Date of Service  07/07/2020  HPI/Events of Note  Multiple issues: 1. Hypoglycemia - Blood glucose = 30 and 2. Agitation - Request to renew restraint orders. Already Rxed with D50.  eICU Interventions  Will order: 1. D10W IV infusion to run at 40 mg/hour.  2. Bilateral soft wrist restraints X 12 hours.      Intervention Category Major Interventions: Delirium, psychosis, severe agitation - evaluation and management;Other:  Lysle Dingwall 07/07/2020, 8:54 PM

## 2020-07-07 NOTE — Progress Notes (Signed)
Dr. Carlis Abbott notified of large maroon stool HDS; HR 97 175/37  Myrle Sheng BSN RN 60M/33M MICU

## 2020-07-07 NOTE — Progress Notes (Signed)
Called for CT results significant for hemoperitoneum. CT images reviewed personally and this is a non-contrasted CT performed approximately 36 hours after an attempted lap chole, aborted due to hemodynamic instability intra-operatively, and after transfusion of multiple units of blood products. In this setting, the findings on CT are not unexpected and the patient's hemodynamics, vasopressor requirement, and response to blood transfusion do not suggest ongoing blood loss that would necessitate laparotomy. Recommend continued balanced resuscitation guided by TEG and maintenance of normothermia. Will continue to follow.   Jesusita Oka, MD General and [facility]

## 2020-07-07 NOTE — Progress Notes (Signed)
[facility] Progress Note Patient Name: Sarah Harmon DOB: 1946-09-04 MRN: [id number]   Date of Service  07/07/2020  HPI/Events of Note  Called by Radiology with results of CT Scan of abdomen/pelvis: 1. Complex high attenuation fluid throughout the abdomen and pelvis consistent with hemoperitoneum. It is unclear whether this represents acute hemorrhage, or residual from prior hemorrhage at the time of laparoscopy. 2. Flattened inferior vena cava which may reflect hypovolemia. 3. Postsurgical changes from recent laparoscopy, with punctate foci of free gas in the upper abdomen. Percutaneous drains within the right upper quadrant and along the dome of the liver. 4. Small bilateral pleural effusions. 5. Complex left renal cyst unchanged.  eICU Interventions  Bedside nurse will call the surgeon on-call and make the surgeon aware of these findings and request that they review the study.      Intervention Category Major Interventions: Other:  Cranston Koors Cornelia Copa 07/07/2020, 1:10 AM

## 2020-07-07 NOTE — Sedation Documentation (Signed)
R groin level 0, 5Fr. Sheath sutured in place. Quick clot/gauze/tegaderm bandage applied. 1+RDP.

## 2020-07-07 NOTE — Progress Notes (Signed)
2 Days Post-Op   Subjective/Chief Complaint: Patient on dialysis, off all pressors Required more blood products overnight - Hgb 8.3, platelets 45, INR 2.1 Her liver seems to be in complete failure - AST over 11,000, T bili increasing Does follow some commands Patient had CT scan last which showed a large amount of blood clot but no active extravasation, as would be expected.  Objective: Vital signs in last 24 hours: Temp:  [94.7 F (34.8 C)-98.4 F (36.9 C)] 97.8 F (36.6 C) (07/21 0700) Pulse Rate:  [60-100] 79 (07/21 0815) Resp:  [15-33] 26 (07/21 0815) BP: (65-181)/(37-118) 155/44 (07/21 0815) SpO2:  [81 %-100 %] 99 % (07/21 0815) Arterial Line BP: (59-270)/(31-150) 140/42 (07/21 0700) FiO2 (%):  [30 %-40 %] 30 % (07/21 0700) Weight:  [65.6 kg-70.4 kg] 67.9 kg (07/21 0700) Last BM Date: 06/19/2020  Intake/Output from previous day: 07/20 0701 - 07/21 0700 In: 2802.9 [I.V.:1061.9; Blood:1321; NG/GT:300; IV Piggyback:120] Out: -393 [Emesis/NG output:20; Drains:87] Intake/Output this shift: Total I/O In: 200 [I.V.:40; IV Piggyback:160] Out: -   PE Thin - frail in NAD Intubated, intermittently follows commands HEENT - mild scleral icterus Abd - soft, non-tender, healing laparoscopic incisions, bloody output via JP, bile in cholecystostomy tube  Lab Results:  Recent Labs    07/07/20 0325 07/07/20 0546  WBC 13.8* 13.6*  HGB 9.0* 8.3*  HCT 26.9* 24.3*  PLT 49* 45*   BMET Recent Labs    07/07/20 0042 07/07/20 0546  NA 140 141  K 4.5 4.3  CL 99 100  CO2 9* 11*  GLUCOSE 146* 140*  BUN 30* 30*  CREATININE 5.98* 6.08*  CALCIUM 9.7 9.4   Hepatic Function Latest Ref Rng & Units 07/07/2020 07/07/2020 07/16/2020  Total Protein 6.5 - 8.1 g/dL 5.0(L) - 5.2(L)  Albumin 3.5 - 5.0 g/dL 2.8(L) 2.5(L) 2.5(L)  AST 15 - 41 U/L >10,000(H) - 2,666(H)  ALT 0 - 44 U/L 3,977(H) - 1,035(H)  Alk Phosphatase 38 - 126 U/L 174(H) - 63  Total Bilirubin 0.3 - 1.2 mg/dL 7.7(H) - 6.8(H)   Bilirubin, Direct 0.0 - 0.3 mg/dL - - -    PT/INR Recent Labs    07/17/2020 0628 07/17/2020 1253  LABPROT 23.1* 22.4*  INR 2.1* 2.1*   ABG Recent Labs    07/12/2020 2344 06/18/2020 0356  PHART 7.249* 7.491*  HCO3 15.0* 15.0*    Studies/Results: CT ABDOMEN PELVIS WO CONTRAST  Result Date: 07/07/2020 CLINICAL DATA:  Cholecystectomy, hemorrhagic shock, anemia, abdominal distension, cirrhosis EXAM: CT ABDOMEN AND PELVIS WITHOUT CONTRAST TECHNIQUE: Multidetector CT imaging of the abdomen and pelvis was performed following the standard protocol without IV contrast. COMPARISON:  08/08/2007 FINDINGS: Lower chest: There are small bilateral pleural effusions with dependent lower lobe atelectasis. Moderate distention of the thoracic esophagus of uncertain etiology. Enteric catheter extends into the gastric lumen. Hepatobiliary: Unenhanced imaging of the liver demonstrates no focal abnormalities. High attenuation fluid surrounding the liver consistent with hemoperitoneum. Gallbladder is surgically absent. Pancreas: Coarse calcifications in the pancreatic head consistent with sequela of chronic calcific pancreatitis. No acute inflammatory change. Spleen: Normal in size without focal abnormality. Adrenals/Urinary Tract: Complex cystic mass upper pole left kidney unchanged since recent MRI. There is bilateral renal cortical atrophy. The adrenals are unremarkable. Bladder is decompressed. Stomach/Bowel: There is extrinsic compression of the stomach from a large complex hyperdense fluid collection in the upper abdomen likely representing hemoperitoneum and clot. There is no bowel obstruction or ileus. Scattered diverticulosis of colon. Vascular/Lymphatic: There is severe atherosclerosis  of the aorta and its branches. Evaluation of the vascular lumen is limited without intravenous contrast. Flattening of the inferior vena cava consistent with hypovolemic state. There are no pathologically enlarged lymph nodes.  Reproductive: Status post hysterectomy. No adnexal masses. Other: Complex fluid is seen throughout the abdomen and pelvis, with areas of increased attenuation, consistent with hemoperitoneum. Percutaneous catheter through the right upper quadrant abdominal wall, with balloon inflated in the complex collection right upper quadrant. Exact location of this catheter is unclear. There is a surgical drain entering the midline abdomen, tip extending along the liver dome. Postsurgical changes at the umbilicus from laparoscopy. Punctate foci of free gas within the upper abdomen consistent with recent surgical intervention. Musculoskeletal: No acute or destructive bony lesions. Reconstructed images demonstrate no additional findings. IMPRESSION: 1. Complex high attenuation fluid throughout the abdomen and pelvis consistent with hemoperitoneum. It is unclear whether this represents acute hemorrhage, or residual from prior hemorrhage at the time of laparoscopy. 2. Flattened inferior vena cava which may reflect hypovolemia. 3. Postsurgical changes from recent laparoscopy, with punctate foci of free gas in the upper abdomen. Percutaneous drains within the right upper quadrant and along the dome of the liver. 4. Small bilateral pleural effusions. 5. Complex left renal cyst unchanged. These results were called by telephone at the time of interpretation on 07/07/2020 at 12:57 am to provider [employer] , who verbally acknowledged these results. Electronically Signed   By: Randa Ngo M.D.   On: 07/07/2020 00:58   DG Chest Port 1 View  Result Date: 06/17/2020 CLINICAL DATA:  Central line placement. EXAM: PORTABLE CHEST 1 VIEW COMPARISON:  Chest x-ray from same day at 0204 hours. FINDINGS: New right internal jugular dialysis catheter with tip in the proximal SVC. Unchanged left internal jugular central venous catheter. Unchanged endotracheal and enteric tubes. Left brachiocephalic vein stent again noted. The heart size and  mediastinal contours are within normal limits. Atherosclerotic calcification of the aortic arch. Normal pulmonary vascularity. Unchanged small left pleural effusion and left basilar atelectasis. The right lung is clear. No pneumothorax. No acute osseous abnormality. IMPRESSION: 1. New right internal jugular dialysis catheter without complicating feature. 2. Unchanged small left pleural effusion and left basilar atelectasis. Electronically Signed   By: Titus Dubin M.D.   On: 07/01/2020 15:01   DG CHEST PORT 1 VIEW  Result Date: 07/03/2020 CLINICAL DATA:  Intubation EXAM: PORTABLE CHEST 1 VIEW COMPARISON:  None. FINDINGS: Support Apparatus: --Endotracheal tube: Tip at the level of the clavicular heads. --Enteric tube:Coiled in the distal esophagus --Catheter(s):Left IJ approach central venous catheter tip is likely in the brachiocephalic vein. --Other: None The heart size and mediastinal contours are within normal limits. The lungs are clear. No pleural effusion or pneumothorax. IMPRESSION: 1. Endotracheal tube tip at the level of the clavicular heads. 2. Enteric tube coiled in the distal esophagus. Recommend removal and replacement. These results will be called to the ordering clinician or representative by the Radiologist Assistant, and communication documented in the PACS or [employer]. Electronically Signed   By: Ulyses Jarred M.D.   On: 07/04/2020 02:18   DG CHEST PORT 1 VIEW  Result Date: 06/28/2020 CLINICAL DATA:  Follow-up ERCP EXAM: PORTABLE CHEST 1 VIEW COMPARISON:  07/19/2018 FINDINGS: There is a left IJ catheter with tip in the expected location of the left innominate vein. The metallic stent graft is identified within the expected location of the left innominate vein is well. Normal heart size. Aortic atherosclerosis. No pleural effusion or  edema. No airspace densities. IMPRESSION: No acute cardiopulmonary abnormalities. Electronically Signed   By: Kerby Moors M.D.   On: 07/04/2020  18:34   DG Abd Portable 1V  Result Date: 07/01/2020 CLINICAL DATA:  Enteric tube placement. EXAM: PORTABLE ABDOMEN - 1 VIEW COMPARISON:  Chest x-ray from same day. FINDINGS: Enteric tube now in the stomach. Unchanged endotracheal tube and left internal jugular central venous catheter. Unchanged right upper quadrant surgical drain. The visualized bowel gas pattern is normal. No radio-opaque calculi or other significant radiographic abnormality are seen. Unchanged left basilar atelectasis and small left pleural effusion. Left brachiocephalic vein stent again noted. No acute osseous abnormality. IMPRESSION: 1. Enteric tube now in the stomach. Electronically Signed   By: Titus Dubin M.D.   On: 07/08/2020 12:58    Anti-infectives: Anti-infectives (From admission, onward)   Start     Dose/Rate Route Frequency Ordered Stop   06/28/2020 1300  piperacillin-tazobactam (ZOSYN) IVPB 2.25 g  Status:  Discontinued        2.25 g 100 mL/hr over 30 Minutes Intravenous Every 8 hours 06/26/2020 0835 06/23/2020 1120   07/15/2020 1300  piperacillin-tazobactam (ZOSYN) IVPB 3.375 g     Discontinue     3.375 g 100 mL/hr over 30 Minutes Intravenous Every 6 hours 07/02/2020 1120     07/14/2020 1200  piperacillin-tazobactam (ZOSYN) IVPB 3.375 g  Status:  Discontinued        3.375 g 100 mL/hr over 30 Minutes Intravenous Every 6 hours 06/29/2020 0652 07/03/2020 0835   06/24/2020 0430  piperacillin-tazobactam (ZOSYN) IVPB 2.25 g  Status:  Discontinued        2.25 g 100 mL/hr over 30 Minutes Intravenous Every 8 hours 07/01/2020 2147 06/19/2020 0652   07/11/2020 2000  piperacillin-tazobactam (ZOSYN) IVPB 3.375 g        3.375 g 12.5 mL/hr over 240 Minutes Intravenous  Once 07/09/2020 1957 07/14/2020 2328      Assessment/Plan: ESRD on HD T/Th/Sat Hepatocellular carcinoma(dx 02/2015 S/P thermoablation follows with Dr. Burr Medico) Alcoholic liver cirrhosis HTN HLD Thrombocytopenia - Plt 78 Acute on chronic anemia Hyponatremia/hypochloremia    Choledocholithiasis Acute cholecystitis  - MRCP shows choledocholithiasis, possible acute cholecystitis with gallbladder distension and mild pericholecystic edema, concern for possible gallbladder neoplasm, and cirrhosis with trace abdominal ascites - S/p unsuccessful ERCP 7/18: due to distorted anatomy (very dilated gallbladder and cystic duct) no papillary orifice was seen and attempts at cholangiogram were failed; appears to be a very distal stone -Laparoscopic cholecystectomy 7/19 - aborted due to diffuse oozing and hypotension; cholecystostomy tube placed/ drain placed -CT scan slightly incorrect - the Foley catheter is inside the gallbladder lumen with the balloon inflated, acting as a percutaneous cholecystostomy tube.  We did not remove the gallbladder.   Liver failure - cirrhosis exacerbated by surgery, now with diffuse pancytopenia, coagulopathy.  The bleeding that she is having is diffuse and cannot be controlled with further surgery or IR.  Transfuse PRBC, platelets, FFP as indicated in hopes that the liver begins to recover  ID - zosyn 7/17>> VTE - SCDs, sq heparin FEN - NPO Foley - none Follow up - TBD  Plan: Continue cholecystostomy tube GI - to determine next steps for CBD obstruction Prognosis is bleak with this lever of hepatic failure   LOS: 4 days    Maia Petties 07/07/2020

## 2020-07-07 NOTE — Progress Notes (Signed)
CRITICAL VALUE ALERT  Critical Value:  CT of Abdomen -high attenuation of fluid, hemoperitoneum, free air in abdomen  Date & Time Notied:  07/07/2020 0120  Provider Notified: Dr. Bobbye Morton  Orders Received/Actions taken: no new orders received

## 2020-07-07 NOTE — Progress Notes (Signed)
Lack of IV access for pantoprazole, octreotide, and numerous blood products for transfusion.  Dr. Carlis Abbott verbal order - OK to use HD cath ports for blood product infusion for stat resuscitation.  Heparin removed from both ports from HD cath. Lines draw and flush appropriately with return of dark venous blood. PRBC's transfusing through access.  Myrle Sheng, BSN, RN 49M/29M MICU

## 2020-07-07 NOTE — Progress Notes (Addendum)
2 PRBC and 2 Platelets infused this afternoon for Hgb 6.2 and Plt 35 in setting of suspected GIB.  Patient was taken to CT for Angio of Abdomen/Pelvis. Active extravasation on CT per radiology. Patient to be taken emergently to IR for embolization per Dr. Carlis Abbott with PCCM.  Notable coagulopathy prior to procedure; elevated PT/INR, thrombocytopenia. Dr. Carlis Abbott ordered 4 FFP, 2 Plt, 2 Cryo to be infused wide open stat prior to procedure  to rapidly correct coagulopathy per Dr. Carlis Abbott. We were able to infuse 2 FFP, 2 Plt, and 2 Cryo prior to transporting to IR. Patient was safely brought to IR by myself and RT. Hemodynamically stable. Report was given to [place], RN in IR and she was to infuse the remaining 2 FFP.  Myrle Sheng, BSN, RN 67M/75M MICU

## 2020-07-07 NOTE — Progress Notes (Addendum)
NAME:  Sarah Harmon, MRN:  [id number], DOB:  [birth year], LOS: 4 ADMISSION DATE:  06/21/2020, CONSULTATION DATE:  07/09/2020 REFERRING MD: Kayleen Memos, DO CHIEF COMPLAINT:  Hemorrhagic shock   Brief History   PCCM consulted for admission to ICU after complicated cholecystectomy. Patient had Abnormal blood loss and needing massive transfusion.    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 As of 7/19 had received 7 u PRBC, 2 FFP and 1 plt. Still on high pressors.  7/20 intubated. Still in sig shock (in spite of more blood products (up to 9 units PRBC), three pressors and bicarb). Made DNR. Did not tol iHD. Plan to start CRRT.  7/21: Hgb cont to drop. 2 more PRBC.  CT abd/pelvis obtained. Reviewed by surgery: "CT are not unexpected and the patient's hemodynamics, vasopressor requirement, and response to blood transfusion do not suggest ongoing blood loss that would necessitate laparotomy. Recommend continued balanced resuscitation guided by TEG and maintenance of normothermia. Will continue to follow". Could not get HD cath to work for CRRT. Off pressors. Seems volume responsive. PLTs 51. Holding stable. INR 2.1 (yesterday) Consults:  Nephrology PCCM GI Surgery Procedures:  7/18 ERCP 7/19 Lap Chole  7/20 intubated for encephalopathy  Significant Diagnostic Tests:  7/17 MRCP :  Choledocholithiasis, with a 1.3 cm distal common duct stone. Gallbladder distension with mild pericholecystic edema. Findings are suspicious for acute cholecystitis. 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 Cirrhosis and segment 8 ablation defect, suboptimally evaluated without gross recurrent or metachronous hepatocellular carcinoma Hemosiderosis.Trace abdominal ascites. Similar upper pole left renal complex cystic lesion, incompletely 7/21 CT abd/pelvis: 1. Complex high attenuation fluid throughout the abdomen and pelvis consistent with hemoperitoneum.  unclear whether this represents acute hemorrhage, or residual from prior hemorrhage at the time of laparoscopy. Flattened inferior vena cava which may reflect hypovolemia. Postsurgical changes from recent laparoscopy, with punctate foci of free gas in the upper abdomen. Percutaneous drains within the right upper quadrant and along the dome of the liver. Small bilateral pleural effusions.   Micro Data:  7/18 blood>>   Antimicrobials:  Zosyn 7/17 >>  Interim history/subjective:  Off pressors   Objective   Blood pressure (Abnormal) 130/40, pulse 80, temperature 97.8 F (36.6 C), temperature source Axillary, resp. rate (Abnormal) 26, height 5\' 3"  (1.6 m), weight 67.9 kg, SpO2 99 %.    Vent Mode: PRVC FiO2 (%):  [30 %-40 %] 30 % Set Rate:  [26 bmp] 26 bmp Vt Set:  [420 mL] 420 mL PEEP:  [5 cmH20] 5 cmH20 Plateau Pressure:  [26 cmH20-30 cmH20] 27 cmH20   Intake/Output Summary (Last 24 hours) at 07/07/2020 0930 Last data filed at 07/07/2020 0800 Gross per 24 hour  Intake 2424.09 ml  Output -393 ml  Net 2817.09 ml   Filed Weights   06/25/2020 0845 07/07/20 0500 07/07/20 0700  Weight: 65.6 kg 70.4 kg 67.9 kg    Examination:  General this is a 74 year old black female resting on full vent support. Now off pressors.  HENT orally intubated. Sclera not icteric. Right IJ dressing intact.  Pulm clear. Decreased Bases. No accessory use Card rrr  abd perc drain w/ bloody output. + but hypoactive bowel sounds. Dark colored stool.  Ext warm and dry. Her right arm is swollen and tight  out of proportion to the left. Left AVF functional  GU anuric  Neuro sedated. Will follow commands.    Resolved Hospital Problem list     Assessment & Plan:  Choledocholithiasis with acute cholecystitis status post attempted lap chole complicated  Intraoperative acute blood loss and hemorrhagic shock->still has CBD obstrution Plan Cont zosyn day 5 (LOT TBD) Drain rx per surg GI to determine POC re: CBD obstruction  Shock- multifactorial-Hemorrhagic and hypovolemia from 3rd spacing -now off pressors.  -volume responsive and tolerating iHD Plan Keep even vol status at this point Cont tele  Holding ac  abd as above  Acute hypoxic vent dependent respiratory failure -intubated for progressive encephalopathy.  -PCXR w/ basilar atx ett good position. HD cath good position Plan Cont full vent support PAD protocol VAP bundle Attempt SBT after HD; reluctant to extubate at this point (would like to let dust settle for a day)   ESRD w/ fluid and electrolyte imbalance on TThS-> Could not successfully do CRRT (catheter malfxn vs Volume status) -tolerating iHD currently Plan Cont iHD per nephro   Anion gap metabolic acidosis w/ sig lactic acidosis. Acid base a little better today. Suspect underlying cirrhosis major factor in lactate clearance.  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 IV infiltrate vs DVT -not candidate for systemic therapeutic ac Plan Keep elevated.  Can consider [place] RUE but will hold off for now    Hypoglycemia  likely due to cirrhosis and acute illness Plan Cont D5W Trend CBG  Acute encephalopathy- resolved this morning Plan Supportive care  Best practice:  Diet: trickle TF Pain/Anxiety/Delirium protocol (if indicated): yes VAP protocol (if indicated): yes DVT prophylaxis: SCDs GI prophylaxis: pantoprazole Glucose control:  Mobility: Bed rest Code Status: Full Family Communication: updated overnight Disposition: ICU  Labs   CBC: Recent Labs  Lab 06/21/2020 0026 06/19/2020 0628 06/19/2020 2034 07/15/2020 2057 06/22/2020 1253 07/12/2020 2040 07/07/20 0042 07/07/20 0325 07/07/20 0546  WBC 7.5   < > 9.8   < > 8.7 12.5* 13.3* 13.8* 13.6*  NEUTROABS 6.0  --  8.3*  --   --   --   --   --   --   HGB 8.4*   < > 10.6*   < > 5.8* 6.3* 9.5* 9.0* 8.3*  HCT 25.6*   < > 32.9*   < > 17.3* 19.4* 28.7* 26.9* 24.3*  MCV 91.1   < > 93.2   < > 89.2 89.8 87.5 86.8 88.0  PLT 99*   < > 79*   < > 80* 55* 51* 49* 45*   < > = values in this interval not displayed.    Basic Metabolic Panel: Recent Labs  Lab 07/11/2020 0026 07/17/2020 0026 06/25/2020 2683 07/02/2020 4196 06/23/2020 2034 07/14/2020 2057 06/21/2020 2344 07/05/2020 0356 07/12/2020 0519 07/07/20 0042 07/07/20 0546  NA 129*   < > 130*   < > 133*   < >  138 146* 144 140 141  K 3.9   < > 3.8   < > 6.1*   < > 5.4* 3.2* 3.5 4.5 4.3  CL 91*   < > 91*  --  104  --   --   --  101 99 100  CO2 26   < > 23  --  13*  --   --   --  15* 9* 11*  GLUCOSE 93   < > 73  --  112*  --   --   --  54* 146* 140*  BUN 20   < > 38*  --  34*  --   --   --  33* 30* 30*  CREATININE 4.49*   < > 7.12*  --  5.82*  --   --   --  6.30* 5.98* 6.08*  CALCIUM 8.8*   < > 8.3*  --  6.8*  --   --   --  9.2 9.7 9.4  MG 1.8  --   --   --  1.8  --   --   --   --   --  1.8  PHOS  --   --   --   --   --   --   --   --   --  8.6* 8.2*   < > = values in this interval not displayed.   GFR: Estimated Creatinine Clearance: 7.4 mL/min (A) (by C-G formula based on SCr of 6.08 mg/dL (H)). Recent Labs    Lab 06/26/2020 2034 07/17/2020 2037 06/20/2020 0519 07/07/2020 1253 06/22/2020 2040 07/07/20 0042 07/07/20 0325 07/07/20 0546  WBC   < >  --  6.8   < > 12.5* 13.3* 13.8* 13.6*  LATICACIDVEN  --  7.9* >11.0*  --   --   --   --   --    < > = values in this interval not displayed.    Liver Function Tests: Recent Labs  Lab 06/17/2020 1506 06/23/2020 1506 07/10/2020 0026 07/01/2020 0628 07/03/2020 0519 07/07/20 0042 07/07/20 0546  AST 169*  --  132* 92* 2,666*  --  >10,000*  ALT 116*  --  93* 63* 1,035*  --  3,977*  ALKPHOS 147*  --  141* 147* 63  --  174*  BILITOT 5.0*  --  5.1* 5.4* 6.8*  --  7.7*  PROT 7.3  --  6.7 6.2* 5.2*  --  5.0*  ALBUMIN 2.9*   < > 2.5* 2.3* 2.5* 2.5* 2.8*   < > = values in this interval not displayed.   Recent Labs  Lab 06/27/2020 1506  LIPASE 34   No results for input(s): AMMONIA in the last 168 hours.  ABG    Component Value Date/Time   PHART 7.491 (H) 07/01/2020 0356   PCO2ART 19.0 (LL) 06/23/2020 0356   PO2ART 80 (L) 07/01/2020 0356   HCO3 15.0 (L) 06/22/2020 0356   TCO2 16 (L) 07/10/2020 0356   ACIDBASEDEF 8.0 (H) 06/24/2020 0356   O2SAT 98.0 07/17/2020 0356     Coagulation Profile: Recent Labs  Lab 07/14/2020 0026 07/07/2020 1744 07/11/2020 2046 06/20/2020 0628 07/15/2020 1253  INR 1.2 1.6* 2.0* 2.1* 2.1*    Cardiac Enzymes: No results for input(s): CKTOTAL, CKMB, CKMBINDEX, TROPONINI in the last 168 hours.  HbA1C: Hgb A1c MFr Bld  Date/Time Value Ref Range Status  12/01/2019 09:40 AM 4.4 (L) 4.6 - 6.5 % Final    Comment:  Glycemic Control Guidelines for People with Diabetes:Non Diabetic:  <6%Goal of Therapy: <7%Additional Action Suggested:  >8%   06/06/2019 12:25 PM 5.1 4.6 - 6.5 % Final    Comment:    Glycemic Control Guidelines for People with Diabetes:Non Diabetic:  <6%Goal of Therapy: <7%Additional Action Suggested:  >8%     CBG: Recent Labs  Lab 07/16/2020 2010 06/25/2020 2037 06/24/2020 2320 07/07/20 0440 07/07/20 0909  GLUCAP 25*  251* 142* 117* 82   Critical care time 45 minutes.   Erick Colace ACNP-BC [place] Pager # [phone] OR # [phone] if no answer

## 2020-07-07 NOTE — Plan of Care (Addendum)
2 melanic BMs  Hb dropped 2 grams since this morning. Octrotide and pantoprazole infusions ordered.  Transfusing 2 units pRBCs. Has trialysis, dual lumen CVC catheters, remains intubated. Working on contacting GI for repeat evaluation and consideration for EGD.  Julian Hy, DO 07/07/20 12:42 PM [facility]   Spoke to GI- planning for STAT CTA abd/ pelvis due to concern for bleeding from CBD.  Julian Hy, DO 07/07/20 12:49 PM [facility]

## 2020-07-08 ENCOUNTER — Inpatient Hospital Stay (HOSPITAL_COMMUNITY): Payer: Medicare Other

## 2020-07-08 DIAGNOSIS — K921 Melena: Secondary | ICD-10-CM

## 2020-07-08 DIAGNOSIS — R748 Abnormal levels of other serum enzymes: Secondary | ICD-10-CM

## 2020-07-08 DIAGNOSIS — J9601 Acute respiratory failure with hypoxia: Secondary | ICD-10-CM

## 2020-07-08 DIAGNOSIS — J9809 Other diseases of bronchus, not elsewhere classified: Secondary | ICD-10-CM

## 2020-07-08 HISTORY — PX: IR US GUIDE VASC ACCESS RIGHT: IMG2390

## 2020-07-08 HISTORY — PX: IR ANGIOGRAM VISCERAL SELECTIVE: IMG657

## 2020-07-08 HISTORY — PX: IR EMBO ART  VEN HEMORR LYMPH EXTRAV  INC GUIDE ROADMAPPING: IMG5450

## 2020-07-08 HISTORY — PX: IR ANGIOGRAM SELECTIVE EACH ADDITIONAL VESSEL: IMG667

## 2020-07-08 LAB — TYPE AND SCREEN
ABO/RH(D): O POS
Antibody Screen: NEGATIVE
Unit division: 0
Unit division: 0
Unit division: 0
Unit division: 0
Unit division: 0
Unit division: 0
Unit division: 0
Unit division: 0
Unit division: 0
Unit division: 0
Unit division: 0
Unit division: 0
Unit division: 0
Unit division: 0

## 2020-07-08 LAB — BPAM RBC
Blood Product Expiration Date: 202108022359
Blood Product Expiration Date: 202108022359
Blood Product Expiration Date: 202108182359
Blood Product Expiration Date: 202108182359
Blood Product Expiration Date: 202108182359
Blood Product Expiration Date: 202108182359
Blood Product Expiration Date: 202108192359
Blood Product Expiration Date: 202108192359
Blood Product Expiration Date: 202108192359
Blood Product Expiration Date: 202108192359
Blood Product Expiration Date: 202108192359
Blood Product Expiration Date: 202108192359
Blood Product Expiration Date: 202108212359
Blood Product Expiration Date: 202108212359
ISSUE DATE / TIME: 202107191701
ISSUE DATE / TIME: 202107191701
ISSUE DATE / TIME: 202107191748
ISSUE DATE / TIME: 202107191748
ISSUE DATE / TIME: 202107191813
ISSUE DATE / TIME: 202107191813
ISSUE DATE / TIME: 202107191821
ISSUE DATE / TIME: 202107191821
ISSUE DATE / TIME: 202107200000
ISSUE DATE / TIME: 202107200617
ISSUE DATE / TIME: 202107201518
ISSUE DATE / TIME: 202107202153
ISSUE DATE / TIME: 202107211252
ISSUE DATE / TIME: 202107211654
Unit Type and Rh: 5100
Unit Type and Rh: 5100
Unit Type and Rh: 5100
Unit Type and Rh: 5100
Unit Type and Rh: 5100
Unit Type and Rh: 5100
Unit Type and Rh: 5100
Unit Type and Rh: 5100
Unit Type and Rh: 5100
Unit Type and Rh: 5100
Unit Type and Rh: 5100
Unit Type and Rh: 5100
Unit Type and Rh: 9500
Unit Type and Rh: 9500

## 2020-07-08 LAB — BPAM FFP
Blood Product Expiration Date: 202107222359
Blood Product Expiration Date: 202107222359
Blood Product Expiration Date: 202107222359
Blood Product Expiration Date: 202107222359
ISSUE DATE / TIME: 202107211700
ISSUE DATE / TIME: 202107211700
ISSUE DATE / TIME: 202107211700
ISSUE DATE / TIME: 202107211700
Unit Type and Rh: 7300
Unit Type and Rh: 7300
Unit Type and Rh: 7300
Unit Type and Rh: 7300

## 2020-07-08 LAB — PREPARE FRESH FROZEN PLASMA
Unit division: 0
Unit division: 0

## 2020-07-08 LAB — COMPREHENSIVE METABOLIC PANEL
ALT: 1698 U/L — ABNORMAL HIGH (ref 0–44)
AST: 5112 U/L — ABNORMAL HIGH (ref 15–41)
Albumin: 3.9 g/dL (ref 3.5–5.0)
Alkaline Phosphatase: 161 U/L — ABNORMAL HIGH (ref 38–126)
Anion gap: 24 — ABNORMAL HIGH (ref 5–15)
BUN: 14 mg/dL (ref 8–23)
CO2: 20 mmol/L — ABNORMAL LOW (ref 22–32)
Calcium: 10.8 mg/dL — ABNORMAL HIGH (ref 8.9–10.3)
Chloride: 95 mmol/L — ABNORMAL LOW (ref 98–111)
Creatinine, Ser: 3.39 mg/dL — ABNORMAL HIGH (ref 0.44–1.00)
GFR calc Af Amer: 15 mL/min — ABNORMAL LOW (ref >60)
GFR calc non Af Amer: 13 mL/min — ABNORMAL LOW (ref >60)
Glucose, Bld: 75 mg/dL (ref 70–99)
Potassium: 3.7 mmol/L (ref 3.5–5.1)
Sodium: 139 mmol/L (ref 135–145)
Total Bilirubin: 8.9 mg/dL — ABNORMAL HIGH (ref 0.3–1.2)
Total Protein: 6.5 g/dL (ref 6.5–8.1)

## 2020-07-08 LAB — RENAL FUNCTION PANEL
Albumin: 3.6 g/dL (ref 3.5–5.0)
Anion gap: 19 — ABNORMAL HIGH (ref 5–15)
BUN: 13 mg/dL (ref 8–23)
CO2: 22 mmol/L (ref 22–32)
Calcium: 9.9 mg/dL (ref 8.9–10.3)
Chloride: 96 mmol/L — ABNORMAL LOW (ref 98–111)
Creatinine, Ser: 3 mg/dL — ABNORMAL HIGH (ref 0.44–1.00)
GFR calc Af Amer: 17 mL/min — ABNORMAL LOW (ref >60)
GFR calc non Af Amer: 15 mL/min — ABNORMAL LOW (ref >60)
Glucose, Bld: 87 mg/dL (ref 70–99)
Phosphorus: 6.4 mg/dL — ABNORMAL HIGH (ref 2.5–4.6)
Potassium: 4 mmol/L (ref 3.5–5.1)
Sodium: 137 mmol/L (ref 135–145)

## 2020-07-08 LAB — PREPARE PLATELET PHERESIS
Unit division: 0
Unit division: 0
Unit division: 0
Unit division: 0

## 2020-07-08 LAB — BPAM PLATELET PHERESIS
Blood Product Expiration Date: 202107222359
Blood Product Expiration Date: 202107222359
Blood Product Expiration Date: 202107232359
Blood Product Expiration Date: 202107242359
ISSUE DATE / TIME: 202107211231
ISSUE DATE / TIME: 202107211235
ISSUE DATE / TIME: 202107211709
ISSUE DATE / TIME: 202107211709
Unit Type and Rh: 5100
Unit Type and Rh: 600
Unit Type and Rh: 6200
Unit Type and Rh: 6200

## 2020-07-08 LAB — PREPARE CRYOPRECIPITATE
Unit division: 0
Unit division: 0

## 2020-07-08 LAB — CBC
HCT: 23.6 % — ABNORMAL LOW (ref 36.0–46.0)
HCT: 25.4 % — ABNORMAL LOW (ref 36.0–46.0)
Hemoglobin: 7.9 g/dL — ABNORMAL LOW (ref 12.0–15.0)
Hemoglobin: 8 g/dL — ABNORMAL LOW (ref 12.0–15.0)
MCH: 29.9 pg (ref 26.0–34.0)
MCH: 28.7 pg (ref 26.0–34.0)
MCHC: 33.5 g/dL (ref 30.0–36.0)
MCHC: 31.5 g/dL (ref 30.0–36.0)
MCV: 89.4 fL (ref 80.0–100.0)
MCV: 91 fL (ref 80.0–100.0)
Platelets: 129 10*3/uL — ABNORMAL LOW (ref 150–400)
Platelets: 99 10*3/uL — ABNORMAL LOW (ref 150–400)
RBC: 2.64 MIL/uL — ABNORMAL LOW (ref 3.87–5.11)
RBC: 2.79 MIL/uL — ABNORMAL LOW (ref 3.87–5.11)
RDW: 15 % (ref 11.5–15.5)
RDW: 15.6 % — ABNORMAL HIGH (ref 11.5–15.5)
WBC: 12.9 10*3/uL — ABNORMAL HIGH (ref 4.0–10.5)
WBC: 15.7 10*3/uL — ABNORMAL HIGH (ref 4.0–10.5)
nRBC: 41.6 % — ABNORMAL HIGH (ref 0.0–0.2)
nRBC: 48.8 % — ABNORMAL HIGH (ref 0.0–0.2)

## 2020-07-08 LAB — BPAM CRYOPRECIPITATE
Blood Product Expiration Date: 202107212055
Blood Product Expiration Date: 202107212055
ISSUE DATE / TIME: 202107211715
ISSUE DATE / TIME: 202107211715
Unit Type and Rh: 5100
Unit Type and Rh: 5100

## 2020-07-08 LAB — PROTIME-INR
INR: 1.8 — ABNORMAL HIGH (ref 0.8–1.2)
INR: 2.4 — ABNORMAL HIGH (ref 0.8–1.2)
Prothrombin Time: 20 seconds — ABNORMAL HIGH (ref 11.4–15.2)
Prothrombin Time: 25.4 seconds — ABNORMAL HIGH (ref 11.4–15.2)

## 2020-07-08 LAB — GLUCOSE, CAPILLARY
Glucose-Capillary: 75 mg/dL (ref 70–99)
Glucose-Capillary: 49 mg/dL — ABNORMAL LOW (ref 70–99)
Glucose-Capillary: 53 mg/dL — ABNORMAL LOW (ref 70–99)
Glucose-Capillary: 170 mg/dL — ABNORMAL HIGH (ref 70–99)
Glucose-Capillary: 105 mg/dL — ABNORMAL HIGH (ref 70–99)
Glucose-Capillary: 78 mg/dL (ref 70–99)
Glucose-Capillary: 70 mg/dL (ref 70–99)
Glucose-Capillary: 62 mg/dL — ABNORMAL LOW (ref 70–99)
Glucose-Capillary: 126 mg/dL — ABNORMAL HIGH (ref 70–99)
Glucose-Capillary: 142 mg/dL — ABNORMAL HIGH (ref 70–99)

## 2020-07-08 LAB — PHOSPHORUS: Phosphorus: 7.4 mg/dL — ABNORMAL HIGH (ref 2.5–4.6)

## 2020-07-08 LAB — PATHOLOGIST SMEAR REVIEW

## 2020-07-08 MED ORDER — DEXTROSE 50 % IV SOLN
INTRAVENOUS | Status: AC
  Administered 2020-07-08: 50 mL
  Filled 2020-07-08: qty 50

## 2020-07-08 MED ORDER — "THROMBI-PAD 3""X3"" EX PADS"
1.0000 | MEDICATED_PAD | Freq: Once | CUTANEOUS | Status: AC
  Administered 2020-07-08: 1 via TOPICAL
  Filled 2020-07-08: qty 1

## 2020-07-08 MED ORDER — SODIUM CHLORIDE 0.9% IV SOLUTION: Freq: Once | INTRAVENOUS | Status: AC

## 2020-07-08 MED ORDER — DEXTROSE 50 % IV SOLN: 25.0000 g | INTRAVENOUS | Status: AC

## 2020-07-08 MED ORDER — PRISMASOL BGK 4/2.5 32-4-2.5 MEQ/L REPLACEMENT SOLN
Status: DC
  Filled 2020-07-08 (×7): qty 5000

## 2020-07-08 MED ORDER — DEXTROSE 50 % IV SOLN
12.5000 g | Freq: Once | INTRAVENOUS | Status: AC
  Administered 2020-07-08: 12.5 g via INTRAVENOUS

## 2020-07-08 MED ORDER — PIPERACILLIN-TAZOBACTAM 3.375 G IVPB
3.3750 g | Freq: Four times a day (QID) | INTRAVENOUS | Status: DC
  Administered 2020-07-08 – 2020-07-11 (×10): 3.375 g via INTRAVENOUS
  Filled 2020-07-08 (×12): qty 50

## 2020-07-08 MED ORDER — PRISMASOL BGK 4/2.5 32-4-2.5 MEQ/L IV SOLN
INTRAVENOUS | Status: DC
  Filled 2020-07-08 (×34): qty 5000

## 2020-07-08 MED ORDER — DEXTROSE 50 % IV SOLN
INTRAVENOUS | Status: AC
  Filled 2020-07-08: qty 50

## 2020-07-08 MED ORDER — HEPARIN SODIUM (PORCINE) 1000 UNIT/ML DIALYSIS
1000.0000 [IU] | INTRAMUSCULAR | Status: DC | PRN
  Administered 2020-07-09 – 2020-07-11 (×2): 2400 [IU] via INTRAVENOUS_CENTRAL
  Filled 2020-07-08 (×4): qty 6

## 2020-07-08 MED ORDER — PANTOPRAZOLE SODIUM 40 MG IV SOLR
40.0000 mg | Freq: Two times a day (BID) | INTRAVENOUS | Status: DC
  Administered 2020-07-08 – 2020-07-13 (×11): 40 mg via INTRAVENOUS
  Filled 2020-07-08 (×11): qty 40

## 2020-07-08 MED ORDER — PRISMASOL BGK 4/2.5 32-4-2.5 MEQ/L REPLACEMENT SOLN
Status: DC
  Filled 2020-07-08 (×5): qty 5000

## 2020-07-08 MED ORDER — VITAL HIGH PROTEIN PO LIQD
1000.0000 mL | ORAL | Status: DC
  Administered 2020-07-08: 1000 mL

## 2020-07-08 NOTE — Progress Notes (Addendum)
3 Days Post-Op   Subjective/Chief Complaint: CT angio last night showed extravasation from an arterial branch near the gallbladder fossa - embolized by IR. Two melanotic stools yesterday - no further episodes Hgb stable, INR still mildly elevated Just underwent bronchoscopy for mucus plugging   Objective: Vital signs in last 24 hours: Temp:  [97.9 F (36.6 C)-99.6 F (37.6 C)] 98.7 F (37.1 C) (07/22 0400) Pulse Rate:  [73-105] 73 (07/22 0805) Resp:  [21-30] 21 (07/22 0805) BP: (68-176)/(26-132) 96/73 (07/22 0805) SpO2:  [87 %-100 %] 94 % (07/22 0805) Arterial Line BP: (136-176)/(37-82) 140/54 (07/22 0645) FiO2 (%):  [30 %-100 %] 100 % (07/22 0805) Weight:  [69 kg] 69 kg (07/21 1124) Last BM Date: 07/07/20  Intake/Output from previous day: 07/21 0701 - 07/22 0700 In: 2552.9 [I.V.:1129.5; Blood:1163; IV Piggyback:260.4] Out: -1157 [Drains:120] Intake/Output this shift: No intake/output data recorded.  PE Thin - frail in NAD Intubated, sedated HEENT - mild scleral icterus Abd - soft, non-tender, healing laparoscopic incisions, decreasing bloody output via JP, minimal output in cholecystostomy tube Lab Results:  Recent Labs    07/07/20 2059 07/08/20 0512  WBC 9.0 12.9*  HGB 7.7* 7.9*  HCT 23.4* 23.6*  PLT 187 129*   BMET Recent Labs    07/07/20 2059 07/08/20 0512  NA 137 139  K 3.4* 3.7  CL 95* 95*  CO2 22 20*  GLUCOSE 118* 75  BUN 11 14  CREATININE 3.05* 3.39*  CALCIUM 9.4 10.8*   Hepatic Function Latest Ref Rng & Units 07/08/2020 07/07/2020 07/07/2020  Total Protein 6.5 - 8.1 g/dL 6.5 5.9(L) 5.0(L)  Albumin 3.5 - 5.0 g/dL 3.9 3.5 5.0(Y)  AST 15 - 41 U/L PENDING 6,355(H) >10,000(H)  ALT 0 - 44 U/L 1,698(H) 2,059(H) 3,977(H)  Alk Phosphatase 38 - 126 U/L 161(H) 148(H) 174(H)  Total Bilirubin 0.3 - 1.2 mg/dL 8.9(H) 6.8(H) 7.7(H)  Bilirubin, Direct 0.0 - 0.3 mg/dL - - -    PT/INR Recent Labs    07/07/20 2059 07/08/20 0512  LABPROT 22.6* 20.0*  INR  2.1* 1.8*   ABG Recent Labs    06/20/2020 2344 07/12/2020 0356  PHART 7.249* 7.491*  HCO3 15.0* 15.0*    Studies/Results: CT ABDOMEN PELVIS WO CONTRAST  Result Date: 07/07/2020 CLINICAL DATA:  Cholecystectomy, hemorrhagic shock, anemia, abdominal distension, cirrhosis EXAM: CT ABDOMEN AND PELVIS WITHOUT CONTRAST TECHNIQUE: Multidetector CT imaging of the abdomen and pelvis was performed following the standard protocol without IV contrast. COMPARISON:  08/08/2007 FINDINGS: Lower chest: There are small bilateral pleural effusions with dependent lower lobe atelectasis. Moderate distention of the thoracic esophagus of uncertain etiology. Enteric catheter extends into the gastric lumen. Hepatobiliary: Unenhanced imaging of the liver demonstrates no focal abnormalities. High attenuation fluid surrounding the liver consistent with hemoperitoneum. Gallbladder is surgically absent. Pancreas: Coarse calcifications in the pancreatic head consistent with sequela of chronic calcific pancreatitis. No acute inflammatory change. Spleen: Normal in size without focal abnormality. Adrenals/Urinary Tract: Complex cystic mass upper pole left kidney unchanged since recent MRI. There is bilateral renal cortical atrophy. The adrenals are unremarkable. Bladder is decompressed. Stomach/Bowel: There is extrinsic compression of the stomach from a large complex hyperdense fluid collection in the upper abdomen likely representing hemoperitoneum and clot. There is no bowel obstruction or ileus. Scattered diverticulosis of colon. Vascular/Lymphatic: There is severe atherosclerosis of the aorta and its branches. Evaluation of the vascular lumen is limited without intravenous contrast. Flattening of the inferior vena cava consistent with hypovolemic state. There are no pathologically  enlarged lymph nodes. Reproductive: Status post hysterectomy. No adnexal masses. Other: Complex fluid is seen throughout the abdomen and pelvis, with areas of  increased attenuation, consistent with hemoperitoneum. Percutaneous catheter through the right upper quadrant abdominal wall, with balloon inflated in the complex collection right upper quadrant. Exact location of this catheter is unclear. There is a surgical drain entering the midline abdomen, tip extending along the liver dome. Postsurgical changes at the umbilicus from laparoscopy. Punctate foci of free gas within the upper abdomen consistent with recent surgical intervention. Musculoskeletal: No acute or destructive bony lesions. Reconstructed images demonstrate no additional findings. IMPRESSION: 1. Complex high attenuation fluid throughout the abdomen and pelvis consistent with hemoperitoneum. It is unclear whether this represents acute hemorrhage, or residual from prior hemorrhage at the time of laparoscopy. 2. Flattened inferior vena cava which may reflect hypovolemia. 3. Postsurgical changes from recent laparoscopy, with punctate foci of free gas in the upper abdomen. Percutaneous drains within the right upper quadrant and along the dome of the liver. 4. Small bilateral pleural effusions. 5. Complex left renal cyst unchanged. These results were called by telephone at the time of interpretation on 07/07/2020 at 12:57 am to provider [facility] , who verbally acknowledged these results. Electronically Signed   By: Randa Ngo M.D.   On: 07/07/2020 00:58   DG Chest Port 1 View  Result Date: 07/08/2020 CLINICAL DATA:  Acute respiratory failure. EXAM: PORTABLE CHEST 1 VIEW COMPARISON:  07/16/2020. FINDINGS: Endotracheal tube, NG tube, right IJ line, left IJ line in stable position. Heart size stable. Progressive dense atelectasis/infiltrate left lung base. Mild right upper and right lower lung atelectasis/infiltrates. Left pleural effusion cannot be excluded. Prominent left upper lung density is noted. This could be related to atelectasis. Left apical pleural fluid collection or hematoma could present in  this fashion. CT of the chest should be considered for further evaluation. Vascular stent noted over the left chest. IMPRESSION: 1. Endotracheal tube, NG tube, right IJ line, left IJ line stable position. 2. Progressive dense atelectasis/infiltrate left lung base. Mild right upper and right lower lung atelectasis/infiltrates. 3. Prominent left upper lung density is noted. This could be related to atelectasis. Left apical pleural fluid collection or hematoma could present in this fashion. CT of the chest should be considered for further evaluation. Electronically Signed   By: Marcello Moores  Register   On: 07/08/2020 07:59   DG Chest Port 1 View  Result Date: 07/17/2020 CLINICAL DATA:  Central line placement. EXAM: PORTABLE CHEST 1 VIEW COMPARISON:  Chest x-ray from same day at 0204 hours. FINDINGS: New right internal jugular dialysis catheter with tip in the proximal SVC. Unchanged left internal jugular central venous catheter. Unchanged endotracheal and enteric tubes. Left brachiocephalic vein stent again noted. The heart size and mediastinal contours are within normal limits. Atherosclerotic calcification of the aortic arch. Normal pulmonary vascularity. Unchanged small left pleural effusion and left basilar atelectasis. The right lung is clear. No pneumothorax. No acute osseous abnormality. IMPRESSION: 1. New right internal jugular dialysis catheter without complicating feature. 2. Unchanged small left pleural effusion and left basilar atelectasis. Electronically Signed   By: Titus Dubin M.D.   On: 06/27/2020 15:01   DG Abd Portable 1V  Result Date: 07/05/2020 CLINICAL DATA:  Enteric tube placement. EXAM: PORTABLE ABDOMEN - 1 VIEW COMPARISON:  Chest x-ray from same day. FINDINGS: Enteric tube now in the stomach. Unchanged endotracheal tube and left internal jugular central venous catheter. Unchanged right upper quadrant surgical drain. The visualized  bowel gas pattern is normal. No radio-opaque calculi or  other significant radiographic abnormality are seen. Unchanged left basilar atelectasis and small left pleural effusion. Left brachiocephalic vein stent again noted. No acute osseous abnormality. IMPRESSION: 1. Enteric tube now in the stomach. Electronically Signed   By: Obie Dredge M.D.   On: 07/14/2020 12:58   CT Angio Abd/Pel w/ and/or w/o  Result Date: 07/07/2020 CLINICAL DATA:  GI bleeding. History of cirrhosis. Concern for possible bleeding from bile duct versus peptic ulcer disease versus varices. History of hepatocellular carcinoma, post microwave ablation. History of attempted laparoscopic cholecystectomy with placement of cholecystostomy tube. EXAM: CTA ABDOMEN AND PELVIS WITHOUT AND WITH CONTRAST TECHNIQUE: Multidetector CT imaging of the abdomen and pelvis was performed using the standard protocol during bolus administration of intravenous contrast. Multiplanar reconstructed images and MIPs were obtained and reviewed to evaluate the vascular anatomy. CONTRAST:  OMNIPAQUE IOHEXOL 350 MG/ML SOLN COMPARISON:  CT abdomen pelvis-07/07/2020; MRCP-06/30/2020; attempted though unsuccessful ERCP-06/25/2020 FINDINGS: VASCULAR Aorta: There is a moderate amount of predominantly calcified atherosclerotic plaque throughout a normal caliber abdominal aorta, not resulting in hemodynamically significant stenosis. No abdominal aortic dissection or periaortic stranding. Celiac: There is a moderate amount of eccentric mixed calcified and noncalcified atherosclerotic plaque involving the origin the celiac artery resulting in approximately 50% luminal narrowing. Conventional branching pattern. There is an ill-defined area of active extravasation adjacent to the gallbladder fossa (image 41, series 11) with associated pooling on the acquired portal venous phase images (images 29 and 34, series 12). The area of ill-defined active arterial extravasation appears to be supplied via several branches of the division of  the right hepatic artery with potential collateral supply from the pancreaticoduodenal branch of the GDA. SMA: There is a minimal to moderate amount of eccentric mixed calcified and noncalcified atherosclerotic plaque involving the origin and main trunk of the SMA, not definitely resulting in hemodynamically significant stenosis. Conventional branching pattern. The distal tributaries the SMA appear widely patent without discrete intraluminal filling defect to suggest distal embolism. 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 Both dominant renal arteries are patent though expectedly atrophic given history of end-stage renal disease. No discrete areas of vessel irregularity to suggest FMD. IMA: Diseased at its origin though remains patent. Inflow: There is a moderate amount of slightly irregular mixed calcified and noncalcified atherosclerotic plaque involving the bilateral common iliac arteries, not resulting in hemodynamically significant stenosis. Calcified atherosclerotic plaque involves the bilateral external iliac arteries, not resulting in hemodynamically significant stenosis bilateral internal iliac arteries are disease though patent of normal caliber. Proximal Outflow: Minimal amount of mixed calcified and noncalcified atherosclerotic plaque involves the bilateral common femoral arteries, not resulting in hemodynamically significant stenosis. The imaged courses of the bilateral deep and superficial femoral arteries are disease though patent without hemodynamically significant narrowing. Veins: The portal vein remains widely patent. The IVC and pelvic venous systems appear widely patent. Review of the MIP images confirms the above findings. _________________________________________________________ NON-VASCULAR Lower chest: Limited visualization of lower thorax demonstrates trace/small bilateral effusions and associated bibasilar  heterogeneous/consolidative opacities, worse within the left lower lobe. Normal heart size. No pericardial effusion. Hepatobiliary: Nodularity hepatic contour compatible with known history of cirrhosis. Stable appearance of ablation defect within the subcapsular aspect of the anterior segment of the right lobe of the liver. No additional discrete hepatic lesions are identified. Balloon retention appears well positioned within an irregular appearing gallbladder. There is mixed attenuation  debris within the gallbladder lumen likely suggestive of biliary sludge and evolving blood products. Note is again made of an approximately 1.0 cm stone within distal aspect of the CBD (image 66, series 12) with associated mild dilatation of the CBD but without intrahepatic biliary duct dilatation. There is a large bore surgical drainage catheter within the perihepatic space. Note is made of multiple mixed attenuating fluid collections throughout the upper abdomen extending to the level of the pelvis nearly all of which have hematocrit levels. There is an ill-defined area of active extravasation adjacent to the gallbladder fossa (image 41, series 11) with associated pooling on the portal venous phase images (images 29 and 34, series 12) compatible with an area of active extravasation. 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 Spleen: Normal appearance of the spleen. Adrenals/Urinary Tract: Redemonstrated atrophy of the bilateral kidneys compatible with end-stage renal disease. Macrolobulated complex cyst involving the superior pole of the left kidney is unchanged. Normal appearance the bilateral adrenal glands. The urinary bladder is underdistended. Stomach/Bowel: There is mass effect upon multiple loops of large and small bowel secondary to of all evolving hematomas within the abdomen and pelvis. The stomach is decompressed with a an enteric  tube. There is a trace amount postoperative residual pneumoperitoneum, unchanged. No evidence of enteric obstruction. Lymphatic: No definite bulky retroperitoneal, mesenteric, pelvic or inguinal lymphadenopathy. Reproductive: Post hysterectomy.  No discrete adnexal lesion. Other: Diffuse body wall anasarca. Musculoskeletal: No acute or aggressive osseous abnormalities. Degenerative change of the bilateral SI joints, unchanged. IMPRESSION: VASCULAR 1. Examination is positive for area of active arterial extravasation adjacent to the gallbladder fossa with associated pooling within the right upper abdomen likely supplied several branches of the anterior division of the right hepatic artery as well as potential supply from the pancreaticoduodenal branch of the GDA. 2. Multiple mixed attenuating fluid collections are seen throughout the abdomen and pelvis, nearly all of which contain hematocrit levels, compatible with of evolving hematomas. 3. Scattered atherosclerotic plaque within normal caliber abdominal aorta. 4. Suspected approximately 50% luminal narrowing involving the origin the celiac artery. NON-VASCULAR 1. Balloon retention catheter within an irregular appearing gallbladder which contains mixed attenuating debris likely representative biliary sludge and evolving blood products. 2. Punctate (approximately 1 cm) stone within distal aspect of the CBD, similar to recent MRCP. No associated significant intrahepatic biliary duct dilatation. 3. Additional ancillary findings as above. Above findings discussed with Dr. Carlis Abbott, and patient proceeded to mesenteric arteriogram with potential embolization. Electronically Signed   By: Sandi Mariscal M.D.   On: 07/07/2020 17:35    Anti-infectives: Anti-infectives (From admission, onward)   Start     Dose/Rate Route Frequency Ordered Stop   07/07/20 2200  piperacillin-tazobactam (ZOSYN) IVPB 3.375 g     Discontinue     3.375 g 100 mL/hr over 30 Minutes Intravenous Every  12 hours 07/07/20 0841     06/26/2020 1300  piperacillin-tazobactam (ZOSYN) IVPB 2.25 g  Status:  Discontinued        2.25 g 100 mL/hr over 30 Minutes Intravenous Every 8 hours 07/15/2020 0835 07/11/2020 1120   06/30/2020 1300  piperacillin-tazobactam (ZOSYN) IVPB 3.375 g  Status:  Discontinued        3.375 g 100 mL/hr over 30 Minutes Intravenous Every 6 hours 07/16/2020 1120 07/07/20 0841   06/20/2020 1200  piperacillin-tazobactam (ZOSYN) IVPB 3.375 g  Status:  Discontinued        3.375 g 100 mL/hr  over 30 Minutes Intravenous Every 6 hours 07/05/2020 0652 07/02/2020 0835   06/29/2020 0430  piperacillin-tazobactam (ZOSYN) IVPB 2.25 g  Status:  Discontinued        2.25 g 100 mL/hr over 30 Minutes Intravenous Every 8 hours 07/05/2020 2147 06/18/2020 0652   07/07/2020 2000  piperacillin-tazobactam (ZOSYN) IVPB 3.375 g        3.375 g 12.5 mL/hr over 240 Minutes Intravenous  Once 06/30/2020 1957 07/12/2020 2328      Assessment/Plan: ESRD on HD T/Th/Sat Hepatocellular carcinoma(dx 02/2015 S/P thermoablation follows with Dr. 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  LFT's improving slightly,  although T. Bili still elevated due to CBD obstruction  Acute blood loss anemia - seems to be stabilized after IR embolization; try to normalize INR; monitor Hgb and platelets ID - zosyn 7/17>> VTE - SCDs, sq heparin FEN - NPO Foley - none Follow up - TBD  Plan:Continue cholecystostomy tube Choledocholithiasis - tomorrow will obtain fluoro study via cholecystostomy tube (Foley catheter in GB) to evaluate biliary tree.  If proximal bile ducts are dilated, will ask IR to perform PTC to stent CBD and allow biliary drainage    LOS: 5 days    Maia Petties 07/08/2020   I spoke with her daughter [name] by telephone to update her on the patient's status and the overall plan.

## 2020-07-08 NOTE — Progress Notes (Signed)
Nutrition Follow-up  DOCUMENTATION CODES:   Non-severe (moderate) malnutrition in context of chronic illness  INTERVENTION:   Restart trickle tube feeds: - Vital High Protein @ 10 ml/hr (240 ml/day)  Trickle tube feeding regimen provides 240 kcal, 21 grams of protein, and 201 ml of H2O.   Goal tube feeding recommendations: - Vital High Protein @ 55 ml/hr (1320 ml/day)  Recommended tube feeding regimen provides 1320 kcal, 116 grams of protein, and 1104 ml of H2O.   NUTRITION DIAGNOSIS:   Moderate Malnutrition related to chronic illness (ESRD, cirrhosis) as evidenced by moderate fat depletion, moderate muscle depletion, severe muscle depletion.  Ongoing  GOAL:   Provide needs based on ASPEN/SCCM guidelines  Unmet at this time  MONITOR:   Vent status, Labs, Weight trends, TF tolerance, Skin, I & O's  REASON FOR ASSESSMENT:   Ventilator    ASSESSMENT:   74 year old female with past medical history of hypertension, hyperlipidemia, depression, anxiety, hepatocellular carcinoma (dx 02/2015 S/P thermoablation), ESRD on HD, alcoholic liver cirrhosis presents with severe abdominal pain. 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  7/17 - s/p MRCP 7/18 - ERCP unsuccessful 7/19 - s/p aborted laparoscopic cholecystectomy with placement of percutaneous cholecystostomy tube, surgery stopped early due to intraoperative hypotension and blood loss, s/p massive transfusion 7/20 - intubated, trickle TF initiated 7/21 - HD, s/p post mesenteric arteriogram and percutaneous coil embolization of the pancreaticoduodenal artery and GDA and particle embolization of the right hepatic artery 7/22 - s/p bronch for mucus plugging  Discussed pt with RN and during ICU rounds. Pt starting CRRT today.  Spoke with CCM MD who  approved restarting tube feeds at trickle rate not to go above 10 ml/hr per MD. Plan is to monitor tolerance and hopefully advance tube feeds tomorrow.  Noted plan for fluoro study via cholecystostomy tube tomorrow to evaluate biliary tree. Per Surgery note, "if proximal bile ducts are dilated, will ask IR to perform PTC to stent CBD and allow biliary drainage."  EDW: 52.4 kg (admit weight)  Patient is currently intubated on ventilator support MV: 8.7 L/min Temp (24hrs), Avg:98.6 F (37 C), Min:97.6 F (36.4 C), Max:99.6 F (37.6 C) BP (a-line): 143/51 MAP (aline): 80  Drips: NS: 10 ml/hr D10: 40 ml/hr  Medications reviewed and include: colace, aranesp, protonix, thiamine, IV abx  Labs reviewed: phosphorus 7.4, elevated LFTs, hemoglobin 7.9 CBG's: 30-170 x 24 hours  Right lateral abdominal drain: 60 ml x 24 hours Right medial abdominal JP drain: 60 ml x 24 hours HD net UF on 07/07/20: 1277 ml I/O's: +13.0 L since admit  Diet Order:   Diet Order            Diet NPO time specified  Diet effective now                 EDUCATION NEEDS:   Not appropriate for education at this time  Skin:  Skin Assessment: Skin Integrity Issues: Incisions: closed abdomen  Last BM:  07/07/20  Height:   Ht Readings from Last 1 Encounters:  06/23/2020 _0  (1.6 m)    Weight:   Wt Readings from Last 1 Encounters:  07/07/20 69 kg    Ideal Body Weight:  52.3 kg  BMI:  Body mass index is 26.95 kg/m.  Estimated Nutritional Needs:   Kcal:  1291  Protein:  95-115 grams  Fluid:  >/=  1.5 L    Gaynell Face, MS, RD, LDN Inpatient Clinical Dietitian Please see [facility] for contact information.

## 2020-07-08 NOTE — Progress Notes (Signed)
[facility] Progress Note  Name: Sarah Harmon MRN: [id number] DOB: Dec 13, 1946  Chief Complaint:  abd pain   Subjective:  Had HD yesterday Angio last PM with extravascation --> s/p embolization, h/h stable this AM.  Bronch for mucus plugging this AM --> not much improvement, not much mucus per report   Review of systems:  Unable to obtain 2/2 intubated   Intake/Output Summary (Last 24 hours) at 07/08/2020 1000 Last data filed at 07/08/2020 0600 Gross per 24 hour  Intake 2352.89 ml  Output -1157 ml  Net 3509.89 ml    Vitals:  Vitals:   07/08/20 0827 07/08/20 0845 07/08/20 0900 07/08/20 0905  BP: (!) 148/53  (!) 60/35   Pulse: 70 73 74 72  Resp: (!) 22 (!) 23 (!) 21 (!) 23  Temp:   98.3 F (36.8 C) 98.3 F (36.8 C)  TempSrc:   Oral Oral  SpO2: 95% 94% 94% 95%  Weight:      Height:         Physical Exam:  General adult female in bed critically ill  HEENT normocephalic atraumatic  Lungs coarse mechanical breath sounds 30/5 FIO2 and PEEP Heart S1S2 no rub Abdomen drains in place, distended abd  Extremities no edema appreciated.  Neuro - no sedation running.does not wake with exam  Access LUE AVF bruit and thrill - dressing maintained as some red blood on gauze; RIJ nontunneled catheter    Medications reviewed    Labs:  BMP Latest Ref Rng & Units 07/08/2020 07/07/2020 07/07/2020  Glucose 70 - 99 mg/dL 75 118(H) 140(H)  BUN 8 - 23 mg/dL 14 11 30(H)  Creatinine 0.44 - 1.00 mg/dL 3.39(H) 3.05(H) 6.08(H)  Sodium 135 - 145 mmol/L 139 137 141  Potassium 3.5 - 5.1 mmol/L 3.7 3.4(L) 4.3  Chloride 98 - 111 mmol/L 95(L) 95(L) 100  CO2 22 - 32 mmol/L 20(L) 22 11(L)  Calcium 8.9 - 10.3 mg/dL 10.8(H) 9.4 9.4     Assessment/Plan:   # Shock - acute blood loss and septic.  S/p embolization arterial bleed last PM per IR. Coagulopathy per critical care.    #Hypoxic resp failure:  Bronch this AM. Minimal mucus. ?ARDs/TRALI. On FiO2 100% already.  Start CRRT  with goal of gentle volume removal today as she has no margin for pulmonary edema.   # Choledocolithiasis with acute cholecystitis - abx per primary.  now s/p aborted lap chole complicated by intraoperative hypotension and blood loss with coagulopathy   # ESRD - s/p HD Wed,  Note normally TTS as outpatient.    No heparin noting platelets.  Per above CRRT today -- d/w PCCM, plans to replace line if not functional today.   # Metabolic acidosis - ok s/p HD yesterday  # AMS/encephalopathy - intubated for airway protection.  mechanical ventilation per pulm   # Anemia - acute blood loss and ESRD.  S/p multiple PRBC's.  aranesp 100 mcg on 7/21 ordered and for weekly for now  # Metabolic bone disease continue VDRA for now   Justin Mend, MD 07/08/2020  10:00 AM

## 2020-07-08 NOTE — Progress Notes (Signed)
PT Cancellation Note  Patient Details Name: Sarah Harmon MRN: [id number] DOB: 05/11/1946   Cancelled Treatment:    Reason Eval/Treat Not Completed: Medical issues which prohibited therapy.  Pt is not appropriate medically to work with therapies.  Will check on 7/23 to see is able to participate. 07/08/2020  [name], PT Acute Rehabilitation Services [phone]  (pager) [phone]  (office)   Tessie Fass Parlee Amescua 07/08/2020, 12:25 PM

## 2020-07-08 NOTE — Progress Notes (Signed)
NAME:  Sarah Harmon, MRN:  [id number], DOB:  [birth year], LOS: 5 ADMISSION DATE:  07/12/2020, CONSULTATION DATE:  06/18/2020 REFERRING MD: Kayleen Memos, DO CHIEF COMPLAINT:  Hemorrhagic shock   Brief History   PCCM consulted for admission to ICU after complicated cholecystectomy. Patient had Abnormal blood loss and needing massive transfusion.    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 As of 7/19 had received 7 u PRBC, 2 FFP and 1 plt. Still on high pressors.  7/20 intubated. Still in sig shock (in spite of more blood products (up to 9 units PRBC), three pressors and bicarb). Made DNR. Did not tol iHD. Plan to start CRRT.  7/21: Hgb cont to drop. 2 more PRBC.  CT abd/pelvis obtained. Reviewed by surgery: "CT are not unexpected and the patient's hemodynamics, vasopressor requirement, and response to blood transfusion do not suggest ongoing blood loss that would necessitate laparotomy. Recommend continued balanced resuscitation guided by TEG and maintenance of normothermia. Will continue to follow". Could not get HD cath to work for CRRT. Off pressors. Seems volume responsive. PLTs 51. Holding stable. INR 2.1 (yesterday) Consults:  Nephrology PCCM GI Surgery IR Procedures:  7/18 ERCP 7/19 Lap Chole  7/20 intubated for encephalopathy 7/20 trialysis catheter placement 7/21 IR embolization  Significant Diagnostic Tests:  7/17 MRCP :  Choledocholithiasis, with a 1.3 cm distal common duct stone. Gallbladder distension with mild pericholecystic edema. Findings are suspicious for acute cholecystitis. 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 Cirrhosis and segment 8 ablation defect, suboptimally evaluated without gross recurrent or metachronous hepatocellular carcinoma Hemosiderosis.Trace abdominal ascites. Similar upper pole left renal complex cystic lesion, incompletely 7/21 CT abd/pelvis: 1. Complex high attenuation fluid throughout the abdomen and pelvis consistent with hemoperitoneum.  unclear whether this represents acute hemorrhage, or residual from prior hemorrhage at the time of laparoscopy. Flattened inferior vena cava which may reflect hypovolemia. Postsurgical changes from recent laparoscopy, with punctate foci of free gas in the upper abdomen. Percutaneous drains within the right upper quadrant and along the dome of the liver. Small bilateral pleural effusions.   Micro Data:  7/18 blood>>   Antimicrobials:  Zosyn 7/17 >>  Interim history/subjective:  Off pressors   Objective   Blood pressure (!) 108/58, pulse 56, temperature (!) 97.5 F (36.4 C), temperature source Axillary, resp. rate 22, height 5\' 3"  (1.6 m), weight 69 kg, SpO2 96 %.    Vent Mode: PRVC FiO2 (%):  [50 %-100 %] 60 % Set Rate:  [20 bmp-26 bmp] 20 bmp Vt Set:  [360 mL-420 mL] 360 mL PEEP:  [5 cmH20-10 cmH20] 10 cmH20 Plateau Pressure:  [24 cmH20-40 cmH20] 24 cmH20   Intake/Output Summary (Last 24 hours) at 07/08/2020 1819 Last data filed at 07/08/2020 1800 Gross per 24 hour  Intake 2338.54 ml  Output 1295 ml  Net 1043.54 ml   Filed Weights   07/07/20 0500 07/07/20 0700 07/07/20 1124  Weight: 70.4 kg 67.9 kg 69 kg    Examination:  General : Frail appearing critically ill elderly woman lying in bed no acute distress, intubated, not sedated HENT [place]/AT, eyes anicteric, ETT in place.  Mild facial  edema today. Pulm: Referred left-sided breath sounds, clear on the right.  High blood pressure since morning, improved this  afternoon. Cardio: Regular rate and rhythm, no murmurs Abd: More distended but soft, mildly tender to palpation right upper quadrant.  Abdominal drain remains in place. Ext: Warm and dry.  No clubbing or cyanosis. Derm: No erythema around paralysis from right IJ line.  Right femoral sheath in place. Neuro RASS -1, follows commands, strong cough   Resolved Hospital Problem list     Assessment & Plan:  Choledocholithiasis with acute cholecystitis status post attempted lap chole complicated by intraoperative acute blood loss and hemorrhagic shock->still has CBD obstrution Plan -Continue Zosyn.  Definitive surgical plan should be made prior to discontinuation given ongoing CBD blockage. -Surgery planning for fluoroscopy study tomorrow for hopeful placement of CBD stent if needed  Shock, resolved (multifactorial-Hemorrhagic and hypovolemia from 3rd spacing) -Continue monitoring with A-line -If requiring vasopressors assess for signs of bleeding.  Acute hypoxic vent dependent respiratory failure. Intubated for progressive encephalopathy.  Increased oxygen requirements overnight due to mucous plugging. -Emergent bronchoscopy this morning.  Improving FiO2, vent pressures throughout the day. -Repeat CXR tomorrow -Continue low tidal volume ventilation -Daily SAT and SBT when appropriate.  Going to radiology for fluoroscopy study tomorrow. -VAP prevention protocol   ESRD w/ fluid and electrolyte imbalance on TThS-> Could not successfully do CRRT (catheter malfxn vs Volume status) -CRRT per nephrology  Anion gap metabolic acidosis w/ lactic acidosis. Underlying cirrhosis major factor in lactate clearance.   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 -Cont to trend LFTs  Acute on Chronic thrombocytopenia due to active bleeding superimposed on underlying cirrhosis -Continue to monitor -Continue holding  anticoagulation -Transfuse for platelets greater than 50  Coagulopathy 2/2 cirrhosis, consumptive from bleeding -FFP today -IR leaving femoral sheath and given oozing  RUE swelling ? IV infiltrate vs DVT -not candidate for systemic therapeutic ac Plan Keep elevated.  Can consider [place] RUE but will hold off for now    Hypoglycemia likely due to cirrhosis and acute illness -Continue D10W -Every 2 hours Accu-Cheks  Acute encephalopathy- resolved this morning -Continue supportive care  Best practice:  Diet: trickle TF Pain/Anxiety/Delirium protocol (if indicated): yes VAP protocol (if indicated): yes DVT prophylaxis: SCDs GI prophylaxis: pantoprazole Glucose control:  Mobility: Bed rest Code Status: Full Family Communication: updated daughter [name] this morning Disposition: ICU  Labs   CBC: Recent Labs  Lab 06/26/2020 0026 07/02/2020 0628 07/17/2020 2034 07/01/2020 2057 07/07/20 0325 07/07/20 0546 07/07/20 1217 07/07/20 2059 07/08/20 0512  WBC 7.5   < > 9.8   < > 13.8* 13.6* 11.3* 9.0 12.9*  NEUTROABS 6.0  --  8.3*  --   --   --   --   --   --   HGB 8.4*   < > 10.6*   < > 9.0* 8.3* 6.2* 7.7* 7.9*  HCT 25.6*   < > 32.9*   < > 26.9* 24.3* 18.0* 23.4* 23.6*  MCV 91.1   < > 93.2   < > 86.8 88.0 83.7 88.0 89.4  PLT 99*   < > 79*   < > 49* 45* 35* 187 129*   < > = values in this interval not displayed.    Basic Metabolic Panel: Recent Labs  Lab 06/30/2020 0026 07/01/2020 3976 07/09/2020 2034 07/04/2020 2057 07/07/20 0042 07/07/20 0546 07/07/20 2059 07/08/20 0512 07/08/20 1600  NA 129*   < >  133*   < > 140 141 137 139 137  K 3.9   < > 6.1*   < > 4.5 4.3 3.4* 3.7 4.0  CL 91*   < > 104   < > 99 100 95* 95* 96*  CO2 26   < > 13*   < > 9* 11* 22 20* 22  GLUCOSE 93   < > 112*   < > 146* 140* 118* 75 87  BUN 20   < > 34*   < > 30* 30* 11 14 13   CREATININE 4.49*   < > 5.82*   < > 5.98* 6.08* 3.05* 3.39* 3.00*  CALCIUM 8.8*   < > 6.8*   < > 9.7 9.4 9.4 10.8* 9.9  MG 1.8  --   1.8  --   --  1.8  --   --   --   PHOS  --   --   --   --  8.6* 8.2*  --  7.4* 6.4*   < > = values in this interval not displayed.   GFR: Estimated Creatinine Clearance: 15.1 mL/min (A) (by C-G formula based on SCr of 3 mg/dL (H)). Recent Labs  Lab 06/30/2020 2034 07/03/2020 2037 07/14/2020 0519 07/02/2020 1253 07/07/20 0546 07/07/20 1217 07/07/20 2059 07/08/20 0512  WBC   < >  --  6.8   < > 13.6* 11.3* 9.0 12.9*  LATICACIDVEN  --  7.9* >11.0*  --   --   --   --   --    < > = values in this interval not displayed.    Liver Function Tests: Recent Labs  Lab 07/04/2020 0628 07/04/2020 0628 07/10/2020 0519 06/25/2020 0519 07/07/20 0042 07/07/20 0546 07/07/20 2059 07/08/20 0512 07/08/20 1600  AST 92*  --  2,666*  --   --  >10,000* 6,355* 5,112*  --   ALT 63*  --  1,035*  --   --  3,977* 2,059* 1,698*  --   ALKPHOS 147*  --  63  --   --  174* 148* 161*  --   BILITOT 5.4*  --  6.8*  --   --  7.7* 6.8* 8.9*  --   PROT 6.2*  --  5.2*  --   --  5.0* 5.9* 6.5  --   ALBUMIN 2.3*   < > 2.5*   < > 2.5* 2.8* 3.5 3.9 3.6   < > = values in this interval not displayed.   Recent Labs  Lab 07/09/2020 1506  LIPASE 34   No results for input(s): AMMONIA in the last 168 hours.  ABG    Component Value Date/Time   PHART 7.491 (H) 06/18/2020 0356   PCO2ART 19.0 (LL) 06/19/2020 0356   PO2ART 80 (L) 07/05/2020 0356   HCO3 15.0 (L) 07/05/2020 0356   TCO2 16 (L) 06/23/2020 0356   ACIDBASEDEF 8.0 (H) 06/17/2020 0356   O2SAT 98.0 06/23/2020 0356     Coagulation Profile: Recent Labs  Lab 06/25/2020 0628 07/07/2020 1253 07/07/20 1217 07/07/20 2059 07/08/20 0512  INR 2.1* 2.1* 4.3* 2.1* 1.8*    Cardiac Enzymes: No results for input(s): CKTOTAL, CKMB, CKMBINDEX, TROPONINI in the last 168 hours.  HbA1C: Hgb A1c MFr Bld  Date/Time Value Ref Range Status  12/01/2019 09:40 AM 4.4 (L) 4.6 - 6.5 % Final    Comment:    Glycemic Control Guidelines for People with Diabetes:Non Diabetic:  <6%Goal of  Therapy: <7%Additional Action Suggested:  >8%  06/06/2019 12:25 PM 5.1 4.6 - 6.5 % Final    Comment:    Glycemic Control Guidelines for People with Diabetes:Non Diabetic:  <6%Goal of Therapy: <7%Additional Action Suggested:  >8%     CBG: Recent Labs  Lab 07/08/20 0820 07/08/20 0842 07/08/20 1141 07/08/20 1449 07/08/20 1627  GLUCAP 53* 170* 105* 78 70     This patient is critically ill with multiple organ system failure which requires frequent high complexity decision making, assessment, support, evaluation, and titration of therapies. This was completed through the application of advanced monitoring technologies and extensive interpretation of multiple databases. During this encounter critical care time was devoted to patient care services described in this note for 39 minutes.  Julian Hy, DO 07/08/20 6:31 PM [facility]

## 2020-07-08 NOTE — Plan of Care (Signed)
High vent pressures (peak and plateau), increased FiO2, asymmetric breath sounds. CXR with LUL collapse and mediastinal shift to the left.   Vt decreased, PEEP increased. Pplat now 30.  Consented daughter [name] over the phone for bronchoscopy to remove mucus plugs.  Julian Hy, DO 07/08/20 7:37 AM [facility]

## 2020-07-08 NOTE — Progress Notes (Signed)
Referring Physician(s): Dr. Georgette Dover  Supervising Physician: Corrie Mckusick  Patient Status:  [facility] - In-pt  Chief Complaint: GI Bleed  Subjective: Patient s/p unsuccessful ERCP 7/18 and cholecystectomy attempt 7/19 with acute diffuse oozing and hypotension, remained intubated with ongoing hemodynamic instability. CTA yesterday afternoon showed active arterial extravasation adjacent to the gallbladder fossa with pooling in the right upper abdomen.  Patient s/p angiogram demonstrating hepatic artery bleeding and embolization.  Right groin sheath left in place due to elevated INR of 4.1    Allergies: Patient has no known allergies.  Medications: Prior to Admission medications   Medication Sig Start Date End Date Taking? Authorizing Provider  Acetaminophen (TYLENOL PO) Take 1 tablet by mouth 3 (three) times a week.   Yes [provider]  ALPRAZolam (XANAX) 0.5 MG tablet TAKE 1 TABLET BY MOUTH ONCE DAILY AT BEDTIME Patient taking differently: Take 0.5 mg by mouth at bedtime.  06/23/20  Yes Biagio Borg, MD  amLODipine (NORVASC) 10 MG tablet Take 1 tablet by mouth once daily Patient taking differently: Take 10 mg by mouth daily.  06/04/20  Yes Biagio Borg, MD  B Complex-C-Zn-Folic Acid (DIALYVITE 175 WITH ZINC) 0.8 MG TABS Take 1 tablet by mouth daily. 09/29/18  Yes [provider]  CONSTULOSE 10 GM/15ML solution TAKE 30 ML BY MOUTH ONCE DAILY Patient taking differently: Take 20 g by mouth daily.  05/19/20  Yes Biagio Borg, MD  folic acid (FOLVITE) 1 MG tablet Take 1 tablet by mouth once daily Patient taking differently: Take 1 mg by mouth daily.  01/26/20  Yes Biagio Borg, MD  hydrALAZINE (APRESOLINE) 100 MG tablet TAKE 1 TABLET BY MOUTH THREE TIMES DAILY Patient taking differently: Take 100 mg by mouth 3 (three) times daily.  01/19/20  Yes Biagio Borg, MD  ketotifen (ZADITOR) 0.025 % ophthalmic solution Place 1 drop into both eyes 2 (two) times daily.   Yes [provider]  lidocaine-prilocaine (EMLA) cream Apply 1 application topically daily as needed (use before dialysis).  10/11/18  Yes [provider]  propranolol (INDERAL) 40 MG tablet Take 1 tablet by mouth twice daily Patient taking differently: Take 40 mg by mouth 2 (two) times daily.  04/06/20  Yes Biagio Borg, MD  Propylene Glycol (SYSTANE BALANCE) 0.6 % SOLN Place 1 drop into both eyes daily.   Yes [provider]  Darbepoetin Alfa (ARANESP) 100 MCG/0.5ML SOSY injection Inject 0.5 mLs (100 mcg total) into the vein every Thursday with hemodialysis. 07/25/18   Cherene Altes, MD  Methoxy PEG-Epoetin Beta (MIRCERA IJ) Mircera 04/06/20 04/05/21  [provider]     Vital Signs: BP (!) 108/58   Pulse 70   Temp 97.7 F (36.5 C) (Axillary)   Resp 22   Ht _0  (1.6 m)   Wt 152 lb 1.9 oz (69 kg)   SpO2 90%   BMI 26.95 kg/m   Physical Exam  Abdomen: mild distention.  Post-operative drains in place per surgery.   Groin: R groin sheath in place.  Oozing noted from around the tract surrounded by combination of congealed and fresh blood which has pooled on chuck pad. Stitch remains in place.  Thigh compressible and soft.  Distal pulses palpable on right.   Imaging: CT ABDOMEN PELVIS WO CONTRAST  Result Date: 07/07/2020 CLINICAL DATA:  Cholecystectomy, hemorrhagic shock, anemia, abdominal distension, cirrhosis EXAM: CT ABDOMEN AND PELVIS WITHOUT CONTRAST TECHNIQUE: Multidetector CT imaging of the abdomen and  pelvis was performed following the standard protocol without IV contrast. COMPARISON:  08/08/2007 FINDINGS: Lower chest: There are small bilateral pleural effusions with dependent lower lobe atelectasis. Moderate distention of the thoracic esophagus of uncertain etiology. Enteric catheter extends into the gastric lumen. Hepatobiliary: Unenhanced imaging of the liver demonstrates no focal abnormalities. High attenuation fluid surrounding the liver consistent  with hemoperitoneum. Gallbladder is surgically absent. Pancreas: Coarse calcifications in the pancreatic head consistent with sequela of chronic calcific pancreatitis. No acute inflammatory change. Spleen: Normal in size without focal abnormality. Adrenals/Urinary Tract: Complex cystic mass upper pole left kidney unchanged since recent MRI. There is bilateral renal cortical atrophy. The adrenals are unremarkable. Bladder is decompressed. Stomach/Bowel: There is extrinsic compression of the stomach from a large complex hyperdense fluid collection in the upper abdomen likely representing hemoperitoneum and clot. There is no bowel obstruction or ileus. Scattered diverticulosis of colon. Vascular/Lymphatic: There is severe atherosclerosis of the aorta and its branches. Evaluation of the vascular lumen is limited without intravenous contrast. Flattening of the inferior vena cava consistent with hypovolemic state. There are no pathologically enlarged lymph nodes. Reproductive: Status post hysterectomy. No adnexal masses. Other: Complex fluid is seen throughout the abdomen and pelvis, with areas of increased attenuation, consistent with hemoperitoneum. Percutaneous catheter through the right upper quadrant abdominal wall, with balloon inflated in the complex collection right upper quadrant. Exact location of this catheter is unclear. There is a surgical drain entering the midline abdomen, tip extending along the liver dome. Postsurgical changes at the umbilicus from laparoscopy. Punctate foci of free gas within the upper abdomen consistent with recent surgical intervention. Musculoskeletal: No acute or destructive bony lesions. Reconstructed images demonstrate no additional findings. IMPRESSION: 1. Complex high attenuation fluid throughout the abdomen and pelvis consistent with hemoperitoneum. It is unclear whether this represents acute hemorrhage, or residual from prior hemorrhage at the time of laparoscopy. 2. Flattened  inferior vena cava which may reflect hypovolemia. 3. Postsurgical changes from recent laparoscopy, with punctate foci of free gas in the upper abdomen. Percutaneous drains within the right upper quadrant and along the dome of the liver. 4. Small bilateral pleural effusions. 5. Complex left renal cyst unchanged. These results were called by telephone at the time of interpretation on 07/07/2020 at 12:57 am to provider [facility] , who verbally acknowledged these results. Electronically Signed   By: Randa Ngo M.D.   On: 07/07/2020 00:58   IR Angiogram Visceral Selective  Result Date: 07/08/2020 INDICATION: History of cirrhosis and end-stage renal disease, currently on dialysis now with attempted though non successful cholecystectomy with postoperative bleeding found to have active extravasation about the gallbladder fossa. Please perform mesenteric arteriogram and percutaneous embolization as indicated. EXAM: 1. ULTRASOUND GUIDANCE FOR ARTERIAL ACCESS 2. SELECTIVE CELIAC ARTERIOGRAM 3. SELECTIVE PANCREATICODUODENAL ARTERIOGRAM AND PERCUTANEOUS COIL EMBOLIZATION 4. SELECTIVE GASTRODUODENAL ARTERIOGRAM AND PERCUTANEOUS COIL EMBOLIZATION 5. SELECTIVE PROPER HEPATIC ARTERIOGRAM 6. SELECTIVE ARTERIOGRAM OF ANTERIOR DIVISION OF THE RIGHT HEPATIC ARTERY AND PERCUTANEOUS PARTICLE EMBOLIZATION 7. SELECTIVE ARTERIOGRAM OF THE RIGHT HEPATIC ARTERY AND PERCUTANEOUS PARTICLE EMBOLIZATION COMPARISON:  CT abdomen and pelvis-earlier same day MEDICATIONS: None ANESTHESIA/SEDATION: Moderate (conscious) sedation was employed during this procedure. A total of Versed 4 mg and Fentanyl 200 mcg was administered intravenously. Moderate Sedation Time: 72 minutes. The patient's level of consciousness and vital signs were monitored continuously by radiology nursing throughout the procedure under my direct supervision. CONTRAST:  75 cc Omnipaque 300 FLUOROSCOPY TIME:  20 minutes, 30 seconds (7824 mGy) COMPLICATIONS: None immediate.  PROCEDURE:  Informed consent was obtained from the patient's daughter following explanation of the procedure, risks, benefits and alternatives. All questions were addressed. A time out was performed prior to the initiation of the procedure. Maximal barrier sterile technique utilized including caps, mask, sterile gowns, sterile gloves, large sterile drape, hand hygiene, and Betadine prep. The right femoral head was marked fluoroscopically. Under sterile conditions and local anesthesia, the right common femoral artery access was performed with a micropuncture needle. Under direct ultrasound guidance, the right common femoral was accessed with a micropuncture kit. An ultrasound image was saved for documentation purposes. This allowed for placement of a 5-French vascular sheath. A limited arteriogram was performed through the side arm of the sheath confirming appropriate access within the right common femoral artery. Over a Bentson wire, a Mickelson catheter was advanced the caudal aspect of the thoracic aorta where was reformed, back bled and flushed. The Mickelson catheter was then utilized to select the celiac artery and a selective celiac arteriogram was performed. Next, with the use of a fathom 14 microwire, a STC microcatheter was advanced to the level of the gastroduodenal artery and a gastroduodenal arteriogram was performed. The microcatheter was then utilized to select the pancreaticoduodenal artery and a selective pancreaticoduodenal arteriogram performed. The peripheral aspect of the pancreaticoduodenal artery was then percutaneously coil embolized with multiple overlapping 3 mm and 4 mm interlock coils. Post embolization arteriogram was performed from level of the GDA. The microcatheter was then retracted to the level of the gastroduodenal artery and the gastroduodenal artery was embolized to near its origin with multiple overlapping 5 mm and 6 mm interlock coils. The Microcatheter was drawn to the level of  the common hepatic artery and a selective common hepatic arteriogram was performed. The microcatheter was removed and a celiac arteriogram was performed through the [facility] catheter. Next, with the use of a fathom 14 microwire, a high-flow microcatheter was advanced to the level of the proper hepatic artery and a proper hepatic arteriogram was performed. The microcatheter was utilized to select the anterior division of the right hepatic artery and a selective arteriogram was performed. Next, the anterior division of the right hepatic artery was percutaneously embolized with approximately 4/5 of one vial of 500-700 Embospheres. Post embolization arteriogram was performed. Next, the remainder of the right hepatic artery was particle embolized with the remaining 1 and 1/5 vials of 500-700 Embospheres. The microcatheter was retracted to the level of the common hepatic artery and completion arteriograms were performed. Images were reviewed and the procedure was terminated. All wires and catheters were removed from the patient. Given patient's elevated INR (greater than 4), the vascular sheath was secured at the right groin access site within interrupted suture and connected to a pressure bag. The patient tolerated the procedure well without immediate post procedural complication. FINDINGS: Selective celiac arteriogram demonstrates conventional anatomy. Note is again made of parasitized vascularity from multiple divisions of the right hepatic artery as well as from a hypertrophy pancreaticoduodenal artery as was suggested on preceding CTA. Additionally, there is early AV shunting about right lobe of the liver likely secondary to patient's background of cirrhosis. Previously identified area of active arterial extravasation demonstrated on preceding abdominal CT is not definitely angiographically. Selective pancreaticoduodenal arteriogram demonstrates apparent parasitized vascularity to the gallbladder fossa, again with  abnormal AV shunting. Following percutaneous coil embolization of both the pancreaticoduodenal and gastroduodenal arteries, there is complete stasis of flow through this vascular distribution. Selective arteriograms of the anterior division of the right hepatic  artery as well as the main right hepatic artery demonstrates abnormal vascularity and parasitized flow about the gallbladder fossa. Following percutaneous particle embolization, there is marked reduction in flow and pruning of the peripheral vasculature throughout the right hepatic arterial vascular distribution. IMPRESSION: 1. Technically successful percutaneous coil embolization of the pancreaticoduodenal and gastroduodenal arteries felt to contribute to paired to rise parasitized abnormal flow to the gallbladder fossa. 2. Technically successful prophylactic percutaneous particle embolization of the anterior division of the right hepatic artery as well as the right hepatic artery due to abnormal vascularity about the gallbladder fossa. PLAN: - Continue resuscitative measures as per the ICU staff. - The patient is to remain flat while vascular sheath remains in place. - Plan to remove vascular sheath once INR below 1.5 (ideally). Electronically Signed   By: Sandi Mariscal M.D.   On: 07/08/2020 09:55   IR Angiogram Selective Each Additional Vessel  Result Date: 07/08/2020 INDICATION: History of cirrhosis and end-stage renal disease, currently on dialysis now with attempted though non successful cholecystectomy with postoperative bleeding found to have active extravasation about the gallbladder fossa. Please perform mesenteric arteriogram and percutaneous embolization as indicated. EXAM: 1. ULTRASOUND GUIDANCE FOR ARTERIAL ACCESS 2. SELECTIVE CELIAC ARTERIOGRAM 3. SELECTIVE PANCREATICODUODENAL ARTERIOGRAM AND PERCUTANEOUS COIL EMBOLIZATION 4. SELECTIVE GASTRODUODENAL ARTERIOGRAM AND PERCUTANEOUS COIL EMBOLIZATION 5. SELECTIVE PROPER HEPATIC ARTERIOGRAM 6.  SELECTIVE ARTERIOGRAM OF ANTERIOR DIVISION OF THE RIGHT HEPATIC ARTERY AND PERCUTANEOUS PARTICLE EMBOLIZATION 7. SELECTIVE ARTERIOGRAM OF THE RIGHT HEPATIC ARTERY AND PERCUTANEOUS PARTICLE EMBOLIZATION COMPARISON:  CT abdomen and pelvis-earlier same day MEDICATIONS: None ANESTHESIA/SEDATION: Moderate (conscious) sedation was employed during this procedure. A total of Versed 4 mg and Fentanyl 200 mcg was administered intravenously. Moderate Sedation Time: 72 minutes. The patient's level of consciousness and vital signs were monitored continuously by radiology nursing throughout the procedure under my direct supervision. CONTRAST:  75 cc Omnipaque 300 FLUOROSCOPY TIME:  20 minutes, 30 seconds (2683 mGy) COMPLICATIONS: None immediate. PROCEDURE: Informed consent was obtained from the patient's daughter following explanation of the procedure, risks, benefits and alternatives. All questions were addressed. A time out was performed prior to the initiation of the procedure. Maximal barrier sterile technique utilized including caps, mask, sterile gowns, sterile gloves, large sterile drape, hand hygiene, and Betadine prep. The right femoral head was marked fluoroscopically. Under sterile conditions and local anesthesia, the right common femoral artery access was performed with a micropuncture needle. Under direct ultrasound guidance, the right common femoral was accessed with a micropuncture kit. An ultrasound image was saved for documentation purposes. This allowed for placement of a 5-French vascular sheath. A limited arteriogram was performed through the side arm of the sheath confirming appropriate access within the right common femoral artery. Over a Bentson wire, a Mickelson catheter was advanced the caudal aspect of the thoracic aorta where was reformed, back bled and flushed. The Mickelson catheter was then utilized to select the celiac artery and a selective celiac arteriogram was performed. Next, with the use of a  fathom 14 microwire, a STC microcatheter was advanced to the level of the gastroduodenal artery and a gastroduodenal arteriogram was performed. The microcatheter was then utilized to select the pancreaticoduodenal artery and a selective pancreaticoduodenal arteriogram performed. The peripheral aspect of the pancreaticoduodenal artery was then percutaneously coil embolized with multiple overlapping 3 mm and 4 mm interlock coils. Post embolization arteriogram was performed from level of the GDA. The microcatheter was then retracted to the level of the gastroduodenal artery and the gastroduodenal artery was  embolized to near its origin with multiple overlapping 5 mm and 6 mm interlock coils. The Microcatheter was drawn to the level of the common hepatic artery and a selective common hepatic arteriogram was performed. The microcatheter was removed and a celiac arteriogram was performed through the [facility] catheter. Next, with the use of a fathom 14 microwire, a high-flow microcatheter was advanced to the level of the proper hepatic artery and a proper hepatic arteriogram was performed. The microcatheter was utilized to select the anterior division of the right hepatic artery and a selective arteriogram was performed. Next, the anterior division of the right hepatic artery was percutaneously embolized with approximately 4/5 of one vial of 500-700 Embospheres. Post embolization arteriogram was performed. Next, the remainder of the right hepatic artery was particle embolized with the remaining 1 and 1/5 vials of 500-700 Embospheres. The microcatheter was retracted to the level of the common hepatic artery and completion arteriograms were performed. Images were reviewed and the procedure was terminated. All wires and catheters were removed from the patient. Given patient's elevated INR (greater than 4), the vascular sheath was secured at the right groin access site within interrupted suture and connected to a pressure  bag. The patient tolerated the procedure well without immediate post procedural complication. FINDINGS: Selective celiac arteriogram demonstrates conventional anatomy. Note is again made of parasitized vascularity from multiple divisions of the right hepatic artery as well as from a hypertrophy pancreaticoduodenal artery as was suggested on preceding CTA. Additionally, there is early AV shunting about right lobe of the liver likely secondary to patient's background of cirrhosis. Previously identified area of active arterial extravasation demonstrated on preceding abdominal CT is not definitely angiographically. Selective pancreaticoduodenal arteriogram demonstrates apparent parasitized vascularity to the gallbladder fossa, again with abnormal AV shunting. Following percutaneous coil embolization of both the pancreaticoduodenal and gastroduodenal arteries, there is complete stasis of flow through this vascular distribution. Selective arteriograms of the anterior division of the right hepatic artery as well as the main right hepatic artery demonstrates abnormal vascularity and parasitized flow about the gallbladder fossa. Following percutaneous particle embolization, there is marked reduction in flow and pruning of the peripheral vasculature throughout the right hepatic arterial vascular distribution. IMPRESSION: 1. Technically successful percutaneous coil embolization of the pancreaticoduodenal and gastroduodenal arteries felt to contribute to paired to rise parasitized abnormal flow to the gallbladder fossa. 2. Technically successful prophylactic percutaneous particle embolization of the anterior division of the right hepatic artery as well as the right hepatic artery due to abnormal vascularity about the gallbladder fossa. PLAN: - Continue resuscitative measures as per the ICU staff. - The patient is to remain flat while vascular sheath remains in place. - Plan to remove vascular sheath once INR below 1.5  (ideally). Electronically Signed   By: Sandi Mariscal M.D.   On: 07/08/2020 09:55   IR Angiogram Selective Each Additional Vessel  Result Date: 07/08/2020 INDICATION: History of cirrhosis and end-stage renal disease, currently on dialysis now with attempted though non successful cholecystectomy with postoperative bleeding found to have active extravasation about the gallbladder fossa. Please perform mesenteric arteriogram and percutaneous embolization as indicated. EXAM: 1. ULTRASOUND GUIDANCE FOR ARTERIAL ACCESS 2. SELECTIVE CELIAC ARTERIOGRAM 3. SELECTIVE PANCREATICODUODENAL ARTERIOGRAM AND PERCUTANEOUS COIL EMBOLIZATION 4. SELECTIVE GASTRODUODENAL ARTERIOGRAM AND PERCUTANEOUS COIL EMBOLIZATION 5. SELECTIVE PROPER HEPATIC ARTERIOGRAM 6. SELECTIVE ARTERIOGRAM OF ANTERIOR DIVISION OF THE RIGHT HEPATIC ARTERY AND PERCUTANEOUS PARTICLE EMBOLIZATION 7. SELECTIVE ARTERIOGRAM OF THE RIGHT HEPATIC ARTERY AND PERCUTANEOUS PARTICLE EMBOLIZATION COMPARISON:  CT abdomen and  pelvis-earlier same day MEDICATIONS: None ANESTHESIA/SEDATION: Moderate (conscious) sedation was employed during this procedure. A total of Versed 4 mg and Fentanyl 200 mcg was administered intravenously. Moderate Sedation Time: 72 minutes. The patient's level of consciousness and vital signs were monitored continuously by radiology nursing throughout the procedure under my direct supervision. CONTRAST:  75 cc Omnipaque 300 FLUOROSCOPY TIME:  20 minutes, 30 seconds (1194 mGy) COMPLICATIONS: None immediate. PROCEDURE: Informed consent was obtained from the patient's daughter following explanation of the procedure, risks, benefits and alternatives. All questions were addressed. A time out was performed prior to the initiation of the procedure. Maximal barrier sterile technique utilized including caps, mask, sterile gowns, sterile gloves, large sterile drape, hand hygiene, and Betadine prep. The right femoral head was marked fluoroscopically. Under sterile  conditions and local anesthesia, the right common femoral artery access was performed with a micropuncture needle. Under direct ultrasound guidance, the right common femoral was accessed with a micropuncture kit. An ultrasound image was saved for documentation purposes. This allowed for placement of a 5-French vascular sheath. A limited arteriogram was performed through the side arm of the sheath confirming appropriate access within the right common femoral artery. Over a Bentson wire, a Mickelson catheter was advanced the caudal aspect of the thoracic aorta where was reformed, back bled and flushed. The Mickelson catheter was then utilized to select the celiac artery and a selective celiac arteriogram was performed. Next, with the use of a fathom 14 microwire, a STC microcatheter was advanced to the level of the gastroduodenal artery and a gastroduodenal arteriogram was performed. The microcatheter was then utilized to select the pancreaticoduodenal artery and a selective pancreaticoduodenal arteriogram performed. The peripheral aspect of the pancreaticoduodenal artery was then percutaneously coil embolized with multiple overlapping 3 mm and 4 mm interlock coils. Post embolization arteriogram was performed from level of the GDA. The microcatheter was then retracted to the level of the gastroduodenal artery and the gastroduodenal artery was embolized to near its origin with multiple overlapping 5 mm and 6 mm interlock coils. The Microcatheter was drawn to the level of the common hepatic artery and a selective common hepatic arteriogram was performed. The microcatheter was removed and a celiac arteriogram was performed through the [facility] catheter. Next, with the use of a fathom 14 microwire, a high-flow microcatheter was advanced to the level of the proper hepatic artery and a proper hepatic arteriogram was performed. The microcatheter was utilized to select the anterior division of the right hepatic artery and a  selective arteriogram was performed. Next, the anterior division of the right hepatic artery was percutaneously embolized with approximately 4/5 of one vial of 500-700 Embospheres. Post embolization arteriogram was performed. Next, the remainder of the right hepatic artery was particle embolized with the remaining 1 and 1/5 vials of 500-700 Embospheres. The microcatheter was retracted to the level of the common hepatic artery and completion arteriograms were performed. Images were reviewed and the procedure was terminated. All wires and catheters were removed from the patient. Given patient's elevated INR (greater than 4), the vascular sheath was secured at the right groin access site within interrupted suture and connected to a pressure bag. The patient tolerated the procedure well without immediate post procedural complication. FINDINGS: Selective celiac arteriogram demonstrates conventional anatomy. Note is again made of parasitized vascularity from multiple divisions of the right hepatic artery as well as from a hypertrophy pancreaticoduodenal artery as was suggested on preceding CTA. Additionally, there is early AV shunting about right lobe of  the liver likely secondary to patient's background of cirrhosis. Previously identified area of active arterial extravasation demonstrated on preceding abdominal CT is not definitely angiographically. Selective pancreaticoduodenal arteriogram demonstrates apparent parasitized vascularity to the gallbladder fossa, again with abnormal AV shunting. Following percutaneous coil embolization of both the pancreaticoduodenal and gastroduodenal arteries, there is complete stasis of flow through this vascular distribution. Selective arteriograms of the anterior division of the right hepatic artery as well as the main right hepatic artery demonstrates abnormal vascularity and parasitized flow about the gallbladder fossa. Following percutaneous particle embolization, there is marked  reduction in flow and pruning of the peripheral vasculature throughout the right hepatic arterial vascular distribution. IMPRESSION: 1. Technically successful percutaneous coil embolization of the pancreaticoduodenal and gastroduodenal arteries felt to contribute to paired to rise parasitized abnormal flow to the gallbladder fossa. 2. Technically successful prophylactic percutaneous particle embolization of the anterior division of the right hepatic artery as well as the right hepatic artery due to abnormal vascularity about the gallbladder fossa. PLAN: - Continue resuscitative measures as per the ICU staff. - The patient is to remain flat while vascular sheath remains in place. - Plan to remove vascular sheath once INR below 1.5 (ideally). Electronically Signed   By: Sandi Mariscal M.D.   On: 07/08/2020 09:55   IR Angiogram Selective Each Additional Vessel  Result Date: 07/08/2020 INDICATION: History of cirrhosis and end-stage renal disease, currently on dialysis now with attempted though non successful cholecystectomy with postoperative bleeding found to have active extravasation about the gallbladder fossa. Please perform mesenteric arteriogram and percutaneous embolization as indicated. EXAM: 1. ULTRASOUND GUIDANCE FOR ARTERIAL ACCESS 2. SELECTIVE CELIAC ARTERIOGRAM 3. SELECTIVE PANCREATICODUODENAL ARTERIOGRAM AND PERCUTANEOUS COIL EMBOLIZATION 4. SELECTIVE GASTRODUODENAL ARTERIOGRAM AND PERCUTANEOUS COIL EMBOLIZATION 5. SELECTIVE PROPER HEPATIC ARTERIOGRAM 6. SELECTIVE ARTERIOGRAM OF ANTERIOR DIVISION OF THE RIGHT HEPATIC ARTERY AND PERCUTANEOUS PARTICLE EMBOLIZATION 7. SELECTIVE ARTERIOGRAM OF THE RIGHT HEPATIC ARTERY AND PERCUTANEOUS PARTICLE EMBOLIZATION COMPARISON:  CT abdomen and pelvis-earlier same day MEDICATIONS: None ANESTHESIA/SEDATION: Moderate (conscious) sedation was employed during this procedure. A total of Versed 4 mg and Fentanyl 200 mcg was administered intravenously. Moderate Sedation Time:  72 minutes. The patient's level of consciousness and vital signs were monitored continuously by radiology nursing throughout the procedure under my direct supervision. CONTRAST:  75 cc Omnipaque 300 FLUOROSCOPY TIME:  20 minutes, 30 seconds (6712 mGy) COMPLICATIONS: None immediate. PROCEDURE: Informed consent was obtained from the patient's daughter following explanation of the procedure, risks, benefits and alternatives. All questions were addressed. A time out was performed prior to the initiation of the procedure. Maximal barrier sterile technique utilized including caps, mask, sterile gowns, sterile gloves, large sterile drape, hand hygiene, and Betadine prep. The right femoral head was marked fluoroscopically. Under sterile conditions and local anesthesia, the right common femoral artery access was performed with a micropuncture needle. Under direct ultrasound guidance, the right common femoral was accessed with a micropuncture kit. An ultrasound image was saved for documentation purposes. This allowed for placement of a 5-French vascular sheath. A limited arteriogram was performed through the side arm of the sheath confirming appropriate access within the right common femoral artery. Over a Bentson wire, a Mickelson catheter was advanced the caudal aspect of the thoracic aorta where was reformed, back bled and flushed. The Mickelson catheter was then utilized to select the celiac artery and a selective celiac arteriogram was performed. Next, with the use of a fathom 14 microwire, a STC microcatheter was advanced to the level of the gastroduodenal  artery and a gastroduodenal arteriogram was performed. The microcatheter was then utilized to select the pancreaticoduodenal artery and a selective pancreaticoduodenal arteriogram performed. The peripheral aspect of the pancreaticoduodenal artery was then percutaneously coil embolized with multiple overlapping 3 mm and 4 mm interlock coils. Post embolization  arteriogram was performed from level of the GDA. The microcatheter was then retracted to the level of the gastroduodenal artery and the gastroduodenal artery was embolized to near its origin with multiple overlapping 5 mm and 6 mm interlock coils. The Microcatheter was drawn to the level of the common hepatic artery and a selective common hepatic arteriogram was performed. The microcatheter was removed and a celiac arteriogram was performed through the [facility] catheter. Next, with the use of a fathom 14 microwire, a high-flow microcatheter was advanced to the level of the proper hepatic artery and a proper hepatic arteriogram was performed. The microcatheter was utilized to select the anterior division of the right hepatic artery and a selective arteriogram was performed. Next, the anterior division of the right hepatic artery was percutaneously embolized with approximately 4/5 of one vial of 500-700 Embospheres. Post embolization arteriogram was performed. Next, the remainder of the right hepatic artery was particle embolized with the remaining 1 and 1/5 vials of 500-700 Embospheres. The microcatheter was retracted to the level of the common hepatic artery and completion arteriograms were performed. Images were reviewed and the procedure was terminated. All wires and catheters were removed from the patient. Given patient's elevated INR (greater than 4), the vascular sheath was secured at the right groin access site within interrupted suture and connected to a pressure bag. The patient tolerated the procedure well without immediate post procedural complication. FINDINGS: Selective celiac arteriogram demonstrates conventional anatomy. Note is again made of parasitized vascularity from multiple divisions of the right hepatic artery as well as from a hypertrophy pancreaticoduodenal artery as was suggested on preceding CTA. Additionally, there is early AV shunting about right lobe of the liver likely secondary to  patient's background of cirrhosis. Previously identified area of active arterial extravasation demonstrated on preceding abdominal CT is not definitely angiographically. Selective pancreaticoduodenal arteriogram demonstrates apparent parasitized vascularity to the gallbladder fossa, again with abnormal AV shunting. Following percutaneous coil embolization of both the pancreaticoduodenal and gastroduodenal arteries, there is complete stasis of flow through this vascular distribution. Selective arteriograms of the anterior division of the right hepatic artery as well as the main right hepatic artery demonstrates abnormal vascularity and parasitized flow about the gallbladder fossa. Following percutaneous particle embolization, there is marked reduction in flow and pruning of the peripheral vasculature throughout the right hepatic arterial vascular distribution. IMPRESSION: 1. Technically successful percutaneous coil embolization of the pancreaticoduodenal and gastroduodenal arteries felt to contribute to paired to rise parasitized abnormal flow to the gallbladder fossa. 2. Technically successful prophylactic percutaneous particle embolization of the anterior division of the right hepatic artery as well as the right hepatic artery due to abnormal vascularity about the gallbladder fossa. PLAN: - Continue resuscitative measures as per the ICU staff. - The patient is to remain flat while vascular sheath remains in place. - Plan to remove vascular sheath once INR below 1.5 (ideally). Electronically Signed   By: Sandi Mariscal M.D.   On: 07/08/2020 09:55   IR US Guide Vasc Access Right  Result Date: 07/08/2020 INDICATION: History of cirrhosis and end-stage renal disease, currently on dialysis now with attempted though non successful cholecystectomy with postoperative bleeding found to have active extravasation about the gallbladder fossa. Please  perform mesenteric arteriogram and percutaneous embolization as indicated.  EXAM: 1. ULTRASOUND GUIDANCE FOR ARTERIAL ACCESS 2. SELECTIVE CELIAC ARTERIOGRAM 3. SELECTIVE PANCREATICODUODENAL ARTERIOGRAM AND PERCUTANEOUS COIL EMBOLIZATION 4. SELECTIVE GASTRODUODENAL ARTERIOGRAM AND PERCUTANEOUS COIL EMBOLIZATION 5. SELECTIVE PROPER HEPATIC ARTERIOGRAM 6. SELECTIVE ARTERIOGRAM OF ANTERIOR DIVISION OF THE RIGHT HEPATIC ARTERY AND PERCUTANEOUS PARTICLE EMBOLIZATION 7. SELECTIVE ARTERIOGRAM OF THE RIGHT HEPATIC ARTERY AND PERCUTANEOUS PARTICLE EMBOLIZATION COMPARISON:  CT abdomen and pelvis-earlier same day MEDICATIONS: None ANESTHESIA/SEDATION: Moderate (conscious) sedation was employed during this procedure. A total of Versed 4 mg and Fentanyl 200 mcg was administered intravenously. Moderate Sedation Time: 72 minutes. The patient's level of consciousness and vital signs were monitored continuously by radiology nursing throughout the procedure under my direct supervision. CONTRAST:  75 cc Omnipaque 300 FLUOROSCOPY TIME:  20 minutes, 30 seconds (8099 mGy) COMPLICATIONS: None immediate. PROCEDURE: Informed consent was obtained from the patient's daughter following explanation of the procedure, risks, benefits and alternatives. All questions were addressed. A time out was performed prior to the initiation of the procedure. Maximal barrier sterile technique utilized including caps, mask, sterile gowns, sterile gloves, large sterile drape, hand hygiene, and Betadine prep. The right femoral head was marked fluoroscopically. Under sterile conditions and local anesthesia, the right common femoral artery access was performed with a micropuncture needle. Under direct ultrasound guidance, the right common femoral was accessed with a micropuncture kit. An ultrasound image was saved for documentation purposes. This allowed for placement of a 5-French vascular sheath. A limited arteriogram was performed through the side arm of the sheath confirming appropriate access within the right common femoral artery.  Over a Bentson wire, a Mickelson catheter was advanced the caudal aspect of the thoracic aorta where was reformed, back bled and flushed. The Mickelson catheter was then utilized to select the celiac artery and a selective celiac arteriogram was performed. Next, with the use of a fathom 14 microwire, a STC microcatheter was advanced to the level of the gastroduodenal artery and a gastroduodenal arteriogram was performed. The microcatheter was then utilized to select the pancreaticoduodenal artery and a selective pancreaticoduodenal arteriogram performed. The peripheral aspect of the pancreaticoduodenal artery was then percutaneously coil embolized with multiple overlapping 3 mm and 4 mm interlock coils. Post embolization arteriogram was performed from level of the GDA. The microcatheter was then retracted to the level of the gastroduodenal artery and the gastroduodenal artery was embolized to near its origin with multiple overlapping 5 mm and 6 mm interlock coils. The Microcatheter was drawn to the level of the common hepatic artery and a selective common hepatic arteriogram was performed. The microcatheter was removed and a celiac arteriogram was performed through the [facility] catheter. Next, with the use of a fathom 14 microwire, a high-flow microcatheter was advanced to the level of the proper hepatic artery and a proper hepatic arteriogram was performed. The microcatheter was utilized to select the anterior division of the right hepatic artery and a selective arteriogram was performed. Next, the anterior division of the right hepatic artery was percutaneously embolized with approximately 4/5 of one vial of 500-700 Embospheres. Post embolization arteriogram was performed. Next, the remainder of the right hepatic artery was particle embolized with the remaining 1 and 1/5 vials of 500-700 Embospheres. The microcatheter was retracted to the level of the common hepatic artery and completion arteriograms were  performed. Images were reviewed and the procedure was terminated. All wires and catheters were removed from the patient. Given patient's elevated INR (greater than 4), the vascular sheath was  secured at the right groin access site within interrupted suture and connected to a pressure bag. The patient tolerated the procedure well without immediate post procedural complication. FINDINGS: Selective celiac arteriogram demonstrates conventional anatomy. Note is again made of parasitized vascularity from multiple divisions of the right hepatic artery as well as from a hypertrophy pancreaticoduodenal artery as was suggested on preceding CTA. Additionally, there is early AV shunting about right lobe of the liver likely secondary to patient's background of cirrhosis. Previously identified area of active arterial extravasation demonstrated on preceding abdominal CT is not definitely angiographically. Selective pancreaticoduodenal arteriogram demonstrates apparent parasitized vascularity to the gallbladder fossa, again with abnormal AV shunting. Following percutaneous coil embolization of both the pancreaticoduodenal and gastroduodenal arteries, there is complete stasis of flow through this vascular distribution. Selective arteriograms of the anterior division of the right hepatic artery as well as the main right hepatic artery demonstrates abnormal vascularity and parasitized flow about the gallbladder fossa. Following percutaneous particle embolization, there is marked reduction in flow and pruning of the peripheral vasculature throughout the right hepatic arterial vascular distribution. IMPRESSION: 1. Technically successful percutaneous coil embolization of the pancreaticoduodenal and gastroduodenal arteries felt to contribute to paired to rise parasitized abnormal flow to the gallbladder fossa. 2. Technically successful prophylactic percutaneous particle embolization of the anterior division of the right hepatic artery as  well as the right hepatic artery due to abnormal vascularity about the gallbladder fossa. PLAN: - Continue resuscitative measures as per the ICU staff. - The patient is to remain flat while vascular sheath remains in place. - Plan to remove vascular sheath once INR below 1.5 (ideally). Electronically Signed   By: Sandi Mariscal M.D.   On: 07/08/2020 09:55   DG Chest Port 1 View  Result Date: 07/08/2020 CLINICAL DATA:  Acute respiratory failure. EXAM: PORTABLE CHEST 1 VIEW COMPARISON:  07/15/2020. FINDINGS: Endotracheal tube, NG tube, right IJ line, left IJ line in stable position. Heart size stable. Progressive dense atelectasis/infiltrate left lung base. Mild right upper and right lower lung atelectasis/infiltrates. Left pleural effusion cannot be excluded. Prominent left upper lung density is noted. This could be related to atelectasis. Left apical pleural fluid collection or hematoma could present in this fashion. CT of the chest should be considered for further evaluation. Vascular stent noted over the left chest. IMPRESSION: 1. Endotracheal tube, NG tube, right IJ line, left IJ line stable position. 2. Progressive dense atelectasis/infiltrate left lung base. Mild right upper and right lower lung atelectasis/infiltrates. 3. Prominent left upper lung density is noted. This could be related to atelectasis. Left apical pleural fluid collection or hematoma could present in this fashion. CT of the chest should be considered for further evaluation. Electronically Signed   By: Marcello Moores  Register   On: 07/08/2020 07:59   DG Chest Port 1 View  Result Date: 06/27/2020 CLINICAL DATA:  Central line placement. EXAM: PORTABLE CHEST 1 VIEW COMPARISON:  Chest x-ray from same day at 0204 hours. FINDINGS: New right internal jugular dialysis catheter with tip in the proximal SVC. Unchanged left internal jugular central venous catheter. Unchanged endotracheal and enteric tubes. Left brachiocephalic vein stent again noted. The  heart size and mediastinal contours are within normal limits. Atherosclerotic calcification of the aortic arch. Normal pulmonary vascularity. Unchanged small left pleural effusion and left basilar atelectasis. The right lung is clear. No pneumothorax. No acute osseous abnormality. IMPRESSION: 1. New right internal jugular dialysis catheter without complicating feature. 2. Unchanged small left pleural effusion and left basilar atelectasis.  Electronically Signed   By: Titus Dubin M.D.   On: 07/17/2020 15:01   DG CHEST PORT 1 VIEW  Result Date: 07/16/2020 CLINICAL DATA:  Intubation EXAM: PORTABLE CHEST 1 VIEW COMPARISON:  None. FINDINGS: Support Apparatus: --Endotracheal tube: Tip at the level of the clavicular heads. --Enteric tube:Coiled in the distal esophagus --Catheter(s):Left IJ approach central venous catheter tip is likely in the brachiocephalic vein. --Other: None The heart size and mediastinal contours are within normal limits. The lungs are clear. No pleural effusion or pneumothorax. IMPRESSION: 1. Endotracheal tube tip at the level of the clavicular heads. 2. Enteric tube coiled in the distal esophagus. Recommend removal and replacement. These results will be called to the ordering clinician or representative by the Radiologist Assistant, and communication documented in the PACS or [employer]. Electronically Signed   By: Ulyses Jarred M.D.   On: 06/19/2020 02:18   DG CHEST PORT 1 VIEW  Result Date: 07/02/2020 CLINICAL DATA:  Follow-up ERCP EXAM: PORTABLE CHEST 1 VIEW COMPARISON:  07/19/2018 FINDINGS: There is a left IJ catheter with tip in the expected location of the left innominate vein. The metallic stent graft is identified within the expected location of the left innominate vein is well. Normal heart size. Aortic atherosclerosis. No pleural effusion or edema. No airspace densities. IMPRESSION: No acute cardiopulmonary abnormalities. Electronically Signed   By: Kerby Moors M.D.    On: 06/21/2020 18:34   DG Abd Portable 1V  Result Date: 07/02/2020 CLINICAL DATA:  Enteric tube placement. EXAM: PORTABLE ABDOMEN - 1 VIEW COMPARISON:  Chest x-ray from same day. FINDINGS: Enteric tube now in the stomach. Unchanged endotracheal tube and left internal jugular central venous catheter. Unchanged right upper quadrant surgical drain. The visualized bowel gas pattern is normal. No radio-opaque calculi or other significant radiographic abnormality are seen. Unchanged left basilar atelectasis and small left pleural effusion. Left brachiocephalic vein stent again noted. No acute osseous abnormality. IMPRESSION: 1. Enteric tube now in the stomach. Electronically Signed   By: Titus Dubin M.D.   On: 06/30/2020 12:58   IR EMBO ART  VEN HEMORR LYMPH EXTRAV  INC GUIDE ROADMAPPING  Result Date: 07/08/2020 INDICATION: History of cirrhosis and end-stage renal disease, currently on dialysis now with attempted though non successful cholecystectomy with postoperative bleeding found to have active extravasation about the gallbladder fossa. Please perform mesenteric arteriogram and percutaneous embolization as indicated. EXAM: 1. ULTRASOUND GUIDANCE FOR ARTERIAL ACCESS 2. SELECTIVE CELIAC ARTERIOGRAM 3. SELECTIVE PANCREATICODUODENAL ARTERIOGRAM AND PERCUTANEOUS COIL EMBOLIZATION 4. SELECTIVE GASTRODUODENAL ARTERIOGRAM AND PERCUTANEOUS COIL EMBOLIZATION 5. SELECTIVE PROPER HEPATIC ARTERIOGRAM 6. SELECTIVE ARTERIOGRAM OF ANTERIOR DIVISION OF THE RIGHT HEPATIC ARTERY AND PERCUTANEOUS PARTICLE EMBOLIZATION 7. SELECTIVE ARTERIOGRAM OF THE RIGHT HEPATIC ARTERY AND PERCUTANEOUS PARTICLE EMBOLIZATION COMPARISON:  CT abdomen and pelvis-earlier same day MEDICATIONS: None ANESTHESIA/SEDATION: Moderate (conscious) sedation was employed during this procedure. A total of Versed 4 mg and Fentanyl 200 mcg was administered intravenously. Moderate Sedation Time: 72 minutes. The patient's level of consciousness and vital signs  were monitored continuously by radiology nursing throughout the procedure under my direct supervision. CONTRAST:  75 cc Omnipaque 300 FLUOROSCOPY TIME:  20 minutes, 30 seconds (8466 mGy) COMPLICATIONS: None immediate. PROCEDURE: Informed consent was obtained from the patient's daughter following explanation of the procedure, risks, benefits and alternatives. All questions were addressed. A time out was performed prior to the initiation of the procedure. Maximal barrier sterile technique utilized including caps, mask, sterile gowns, sterile gloves, large sterile drape, hand hygiene, and Betadine  prep. The right femoral head was marked fluoroscopically. Under sterile conditions and local anesthesia, the right common femoral artery access was performed with a micropuncture needle. Under direct ultrasound guidance, the right common femoral was accessed with a micropuncture kit. An ultrasound image was saved for documentation purposes. This allowed for placement of a 5-French vascular sheath. A limited arteriogram was performed through the side arm of the sheath confirming appropriate access within the right common femoral artery. Over a Bentson wire, a Mickelson catheter was advanced the caudal aspect of the thoracic aorta where was reformed, back bled and flushed. The Mickelson catheter was then utilized to select the celiac artery and a selective celiac arteriogram was performed. Next, with the use of a fathom 14 microwire, a STC microcatheter was advanced to the level of the gastroduodenal artery and a gastroduodenal arteriogram was performed. The microcatheter was then utilized to select the pancreaticoduodenal artery and a selective pancreaticoduodenal arteriogram performed. The peripheral aspect of the pancreaticoduodenal artery was then percutaneously coil embolized with multiple overlapping 3 mm and 4 mm interlock coils. Post embolization arteriogram was performed from level of the GDA. The microcatheter was  then retracted to the level of the gastroduodenal artery and the gastroduodenal artery was embolized to near its origin with multiple overlapping 5 mm and 6 mm interlock coils. The Microcatheter was drawn to the level of the common hepatic artery and a selective common hepatic arteriogram was performed. The microcatheter was removed and a celiac arteriogram was performed through the [facility] catheter. Next, with the use of a fathom 14 microwire, a high-flow microcatheter was advanced to the level of the proper hepatic artery and a proper hepatic arteriogram was performed. The microcatheter was utilized to select the anterior division of the right hepatic artery and a selective arteriogram was performed. Next, the anterior division of the right hepatic artery was percutaneously embolized with approximately 4/5 of one vial of 500-700 Embospheres. Post embolization arteriogram was performed. Next, the remainder of the right hepatic artery was particle embolized with the remaining 1 and 1/5 vials of 500-700 Embospheres. The microcatheter was retracted to the level of the common hepatic artery and completion arteriograms were performed. Images were reviewed and the procedure was terminated. All wires and catheters were removed from the patient. Given patient's elevated INR (greater than 4), the vascular sheath was secured at the right groin access site within interrupted suture and connected to a pressure bag. The patient tolerated the procedure well without immediate post procedural complication. FINDINGS: Selective celiac arteriogram demonstrates conventional anatomy. Note is again made of parasitized vascularity from multiple divisions of the right hepatic artery as well as from a hypertrophy pancreaticoduodenal artery as was suggested on preceding CTA. Additionally, there is early AV shunting about right lobe of the liver likely secondary to patient's background of cirrhosis. Previously identified area of active  arterial extravasation demonstrated on preceding abdominal CT is not definitely angiographically. Selective pancreaticoduodenal arteriogram demonstrates apparent parasitized vascularity to the gallbladder fossa, again with abnormal AV shunting. Following percutaneous coil embolization of both the pancreaticoduodenal and gastroduodenal arteries, there is complete stasis of flow through this vascular distribution. Selective arteriograms of the anterior division of the right hepatic artery as well as the main right hepatic artery demonstrates abnormal vascularity and parasitized flow about the gallbladder fossa. Following percutaneous particle embolization, there is marked reduction in flow and pruning of the peripheral vasculature throughout the right hepatic arterial vascular distribution. IMPRESSION: 1. Technically successful percutaneous coil embolization of the pancreaticoduodenal  and gastroduodenal arteries felt to contribute to paired to rise parasitized abnormal flow to the gallbladder fossa. 2. Technically successful prophylactic percutaneous particle embolization of the anterior division of the right hepatic artery as well as the right hepatic artery due to abnormal vascularity about the gallbladder fossa. PLAN: - Continue resuscitative measures as per the ICU staff. - The patient is to remain flat while vascular sheath remains in place. - Plan to remove vascular sheath once INR below 1.5 (ideally). Electronically Signed   By: Sandi Mariscal M.D.   On: 07/08/2020 09:55   CT Angio Abd/Pel w/ and/or w/o  Result Date: 07/07/2020 CLINICAL DATA:  GI bleeding. History of cirrhosis. Concern for possible bleeding from bile duct versus peptic ulcer disease versus varices. History of hepatocellular carcinoma, post microwave ablation. History of attempted laparoscopic cholecystectomy with placement of cholecystostomy tube. EXAM: CTA ABDOMEN AND PELVIS WITHOUT AND WITH CONTRAST TECHNIQUE: Multidetector CT imaging of the  abdomen and pelvis was performed using the standard protocol during bolus administration of intravenous contrast. Multiplanar reconstructed images and MIPs were obtained and reviewed to evaluate the vascular anatomy. CONTRAST:  180m OMNIPAQUE IOHEXOL 350 MG/ML SOLN COMPARISON:  CT abdomen pelvis-07/07/2020; MRCP-06/28/2020; attempted though unsuccessful ERCP-06/20/2020 FINDINGS: VASCULAR Aorta: There is a moderate amount of predominantly calcified atherosclerotic plaque throughout a normal caliber abdominal aorta, not resulting in hemodynamically significant stenosis. No abdominal aortic dissection or periaortic stranding. Celiac: There is a moderate amount of eccentric mixed calcified and noncalcified atherosclerotic plaque involving the origin the celiac artery resulting in approximately 50% luminal narrowing. Conventional branching pattern. There is an ill-defined area of active extravasation adjacent to the gallbladder fossa (image 41, series 11) with associated pooling on the acquired portal venous phase images (images 29 and 34, series 12). The area of ill-defined active arterial extravasation appears to be supplied via several branches of the division of the right hepatic artery with potential collateral supply from the pancreaticoduodenal branch of the GDA. SMA: There is a minimal to moderate amount of eccentric mixed calcified and noncalcified atherosclerotic plaque involving the origin and main trunk of the SMA, not definitely resulting in hemodynamically significant stenosis. Conventional branching pattern. The distal tributaries the SMA appear widely patent without discrete intraluminal filling defect to suggest distal embolism. 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 Both dominant renal arteries are patent though expectedly atrophic given history of end-stage renal disease. No discrete areas of vessel irregularity to  suggest FMD. IMA: Diseased at its origin though remains patent. Inflow: There is a moderate amount of slightly irregular mixed calcified and noncalcified atherosclerotic plaque involving the bilateral common iliac arteries, not resulting in hemodynamically significant stenosis. Calcified atherosclerotic plaque involves the bilateral external iliac arteries, not resulting in hemodynamically significant stenosis bilateral internal iliac arteries are disease though patent of normal caliber. Proximal Outflow: Minimal amount of mixed calcified and noncalcified atherosclerotic plaque involves the bilateral common femoral arteries, not resulting in hemodynamically significant stenosis. The imaged courses of the bilateral deep and superficial femoral arteries are disease though patent without hemodynamically significant narrowing. Veins: The portal vein remains widely patent. The IVC and pelvic venous systems appear widely patent. Review of the MIP images confirms the above findings. _________________________________________________________ NON-VASCULAR Lower chest: Limited visualization of lower thorax demonstrates trace/small bilateral effusions and associated bibasilar heterogeneous/consolidative opacities, worse within the left lower lobe. Normal heart size. No pericardial effusion. Hepatobiliary: Nodularity hepatic contour compatible with known history of cirrhosis. Stable appearance  of ablation defect within the subcapsular aspect of the anterior segment of the right lobe of the liver. No additional discrete hepatic lesions are identified. Balloon retention appears well positioned within an irregular appearing gallbladder. There is mixed attenuation debris within the gallbladder lumen likely suggestive of biliary sludge and evolving blood products. Note is again made of an approximately 1.0 cm stone within distal aspect of the CBD (image 66, series 12) with associated mild dilatation of the CBD but without  intrahepatic biliary duct dilatation. There is a large bore surgical drainage catheter within the perihepatic space. Note is made of multiple mixed attenuating fluid collections throughout the upper abdomen extending to the level of the pelvis nearly all of which have hematocrit levels. There is an ill-defined area of active extravasation adjacent to the gallbladder fossa (image 41, series 11) with associated pooling on the portal venous phase images (images 29 and 34, series 12) compatible with an area of active extravasation. 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 Spleen: Normal appearance of the spleen. Adrenals/Urinary Tract: Redemonstrated atrophy of the bilateral kidneys compatible with end-stage renal disease. Macrolobulated complex cyst involving the superior pole of the left kidney is unchanged. Normal appearance the bilateral adrenal glands. The urinary bladder is underdistended. Stomach/Bowel: There is mass effect upon multiple loops of large and small bowel secondary to of all evolving hematomas within the abdomen and pelvis. The stomach is decompressed with a an enteric tube. There is a trace amount postoperative residual pneumoperitoneum, unchanged. No evidence of enteric obstruction. Lymphatic: No definite bulky retroperitoneal, mesenteric, pelvic or inguinal lymphadenopathy. Reproductive: Post hysterectomy.  No discrete adnexal lesion. Other: Diffuse body wall anasarca. Musculoskeletal: No acute or aggressive osseous abnormalities. Degenerative change of the bilateral SI joints, unchanged. IMPRESSION: VASCULAR 1. Examination is positive for area of active arterial extravasation adjacent to the gallbladder fossa with associated pooling within the right upper abdomen likely supplied several branches of the anterior division of the right hepatic artery as well as potential supply from the pancreaticoduodenal  branch of the GDA. 2. Multiple mixed attenuating fluid collections are seen throughout the abdomen and pelvis, nearly all of which contain hematocrit levels, compatible with of evolving hematomas. 3. Scattered atherosclerotic plaque within normal caliber abdominal aorta. 4. Suspected approximately 50% luminal narrowing involving the origin the celiac artery. NON-VASCULAR 1. Balloon retention catheter within an irregular appearing gallbladder which contains mixed attenuating debris likely representative biliary sludge and evolving blood products. 2. Punctate (approximately 1 cm) stone within distal aspect of the CBD, similar to recent MRCP. No associated significant intrahepatic biliary duct dilatation. 3. Additional ancillary findings as above. Above findings discussed with Dr. Carlis Abbott, and patient proceeded to mesenteric arteriogram with potential embolization. Electronically Signed   By: Sandi Mariscal M.D.   On: 07/07/2020 17:35    Labs:  CBC: Recent Labs    07/07/20 0546 07/07/20 1217 07/07/20 2059 07/08/20 0512  WBC 13.6* 11.3* 9.0 12.9*  HGB 8.3* 6.2* 7.7* 7.9*  HCT 24.3* 18.0* 23.4* 23.6*  PLT 45* 35* 187 129*    COAGS: Recent Labs    07/15/2020 0026 06/19/2020 1744 06/21/2020 1253 07/07/20 1217 07/07/20 2059 07/08/20 0512  INR 1.2   < > 2.1* 4.3* 2.1* 1.8*  APTT 34  --   --   --   --   --    < > = values in this interval not displayed.    BMP: Recent Labs  07/07/20 0042 07/07/20 0546 07/07/20 2059 07/08/20 0512  NA 140 141 137 139  K 4.5 4.3 3.4* 3.7  CL 99 100 95* 95*  CO2 9* 11* 22 20*  GLUCOSE 146* 140* 118* 75  BUN 30* 30* 11 14  CALCIUM 9.7 9.4 9.4 10.8*  CREATININE 5.98* 6.08* 3.05* 3.39*  GFRNONAA 6* 6* 14* 13*  GFRAA 7* 7* 17* 15*    LIVER FUNCTION TESTS: Recent Labs    06/23/2020 0519 07/17/2020 0519 07/07/20 0042 07/07/20 0546 07/07/20 2059 07/08/20 0512  BILITOT 6.8*  --   --  7.7* 6.8* 8.9*  AST 2,666*  --   --  >10,000* 6,355* 5,112*  ALT 1,035*   --   --  3,977* 2,059* 1,698*  ALKPHOS 63  --   --  174* 148* 161*  PROT 5.2*  --   --  5.0* 5.9* 6.5  ALBUMIN 2.5*   < > 2.5* 2.8* 3.5 3.9   < > = values in this interval not displayed.    Assessment and Plan: GI bleed s/p coil embolization of the pancreaticoduodenal and gastroduodenal arteries, emoblization of the right hepatic artery. Patient assessed today s/p intervention.  Groin sheath in place with bleeding from the tract.  Sheath remains in place with suture intact.  It has not retracted.  Thigh remains soft. She does have abdominal distention.  Distal pulses intact- DP and PT faintly palpable.  INR remains 1.8.  Discussed with RN who reports that all visible blood on chuck pad appears to be from sheath.  There have been no bloody bowel movements thus far today.  Discussed with Dr. Earleen Newport who recommends leaving sheath in place overnight and monitoring INR trends.  If INR continues to decline (ideal would be <1.5) the sheath can be removed.  Continue with supportive care for now.   Electronically Signed: Docia Barrier, PA 07/08/2020, 2:59 PM   I spent a total of 15 Minutes at the the patient's bedside AND on the patient's hospital floor or unit, greater than 50% of which was counseling/coordinating care for GI bleed.

## 2020-07-08 NOTE — Progress Notes (Addendum)
Daily Rounding Note  07/08/2020, 1:02 PM  LOS: 5 days   SUBJECTIVE:   Chief complaint:  Aborted cholecystectomy due to bleed, s/p embolization.  Cirrhosis, coagulopathy.  Blood loss anemia.     Melena yesterday, none today.  Blood still emerging into JP and cholecystostomy drains. 60 mL from JP and 60 mL from perc chole drains yesterday.   bronchd to address mucous plug this AM.    About to start CRRT (in place of usual HD).     OBJECTIVE:         Vital signs in last 24 hours:    Temp:  [97.6 F (36.4 C)-99.6 F (37.6 C)] 97.7 F (36.5 C) (07/22 1142) Pulse Rate:  [70-105] 78 (07/22 1132) Resp:  [20-30] 24 (07/22 1132) BP: (60-176)/(26-132) 60/35 (07/22 0900) SpO2:  [87 %-100 %] 95 % (07/22 1132) Arterial Line BP: (118-169)/(40-82) 144/55 (07/22 0905) FiO2 (%):  [30 %-100 %] 70 % (07/22 1132) Last BM Date: 07/07/20 Filed Weights   07/07/20 0500 07/07/20 0700 07/07/20 1124  Weight: 70.4 kg 67.9 kg 69 kg   General: sedated on vent   Heart: RRR.  Rate in 70s Chest: on vent, no resp distress.   Abdomen: sanguinous discharge in JP drain.  Bilious/snguinous discharge in perc chole drain.  Gauze bandage over R abdomen is clean, not bloody.      Extremities: bleeding on gauze bandage at R groin.  no CCE Neuro/Psych:  Unresponsive on vent  Intake/Output from previous day: 07/21 0701 - 07/22 0700 In: 2552.9 [I.V.:1129.5; Blood:1163; IV Piggyback:260.4] Out: -6659 [Drains:120]  Intake/Output this shift: No intake/output data recorded.  Lab Results: Recent Labs    07/07/20 1217 07/07/20 2059 07/08/20 0512  WBC 11.3* 9.0 12.9*  HGB 6.2* 7.7* 7.9*  HCT 18.0* 23.4* 23.6*  PLT 35* 187 129*   BMET Recent Labs    07/07/20 0546 07/07/20 2059 07/08/20 0512  NA 141 137 139  K 4.3 3.4* 3.7  CL 100 95* 95*  CO2 11* 22 20*  GLUCOSE 140* 118* 75  BUN 30* 11 14  CREATININE 6.08* 3.05* 3.39*  CALCIUM 9.4 9.4 10.8*     LFT Recent Labs    07/07/20 0546 07/07/20 2059 07/08/20 0512  PROT 5.0* 5.9* 6.5  ALBUMIN 2.8* 3.5 3.9  AST >10,000* 6,355* 5,112*  ALT 3,977* 2,059* 1,698*  ALKPHOS 174* 148* 161*  BILITOT 7.7* 6.8* 8.9*   PT/INR Recent Labs    07/07/20 2059 07/08/20 0512  LABPROT 22.6* 20.0*  INR 2.1* 1.8*   Hepatitis Panel No results for input(s): HEPBSAG, HCVAB, HEPAIGM, HEPBIGM in the last 72 hours.  Studies/Results: CT ABDOMEN PELVIS WO CONTRAST  Result Date: 07/07/2020 CLINICAL DATA:  Cholecystectomy, hemorrhagic shock, anemia, abdominal distension, cirrhosis EXAM: CT ABDOMEN AND PELVIS WITHOUT CONTRAST TECHNIQUE: Multidetector CT imaging of the abdomen and pelvis was performed following the standard protocol without IV contrast. COMPARISON:  08/08/2007 FINDINGS: Lower chest: There are small bilateral pleural effusions with dependent lower lobe atelectasis. Moderate distention of the thoracic esophagus of uncertain etiology. Enteric catheter extends into the gastric lumen. Hepatobiliary: Unenhanced imaging of the liver demonstrates no focal abnormalities. High attenuation fluid surrounding the liver consistent with hemoperitoneum. Gallbladder is surgically absent. Pancreas: Coarse calcifications in the pancreatic head consistent with sequela of chronic calcific pancreatitis. No acute inflammatory change. Spleen: Normal in size without focal abnormality. Adrenals/Urinary Tract: Complex cystic mass upper pole left kidney unchanged since recent MRI. There is  bilateral renal cortical atrophy. The adrenals are unremarkable. Bladder is decompressed. Stomach/Bowel: There is extrinsic compression of the stomach from a large complex hyperdense fluid collection in the upper abdomen likely representing hemoperitoneum and clot. There is no bowel obstruction or ileus. Scattered diverticulosis of colon. Vascular/Lymphatic: There is severe atherosclerosis of the aorta and its branches. Evaluation of the  vascular lumen is limited without intravenous contrast. Flattening of the inferior vena cava consistent with hypovolemic state. There are no pathologically enlarged lymph nodes. Reproductive: Status post hysterectomy. No adnexal masses. Other: Complex fluid is seen throughout the abdomen and pelvis, with areas of increased attenuation, consistent with hemoperitoneum. Percutaneous catheter through the right upper quadrant abdominal wall, with balloon inflated in the complex collection right upper quadrant. Exact location of this catheter is unclear. There is a surgical drain entering the midline abdomen, tip extending along the liver dome. Postsurgical changes at the umbilicus from laparoscopy. Punctate foci of free gas within the upper abdomen consistent with recent surgical intervention. Musculoskeletal: No acute or destructive bony lesions. Reconstructed images demonstrate no additional findings. IMPRESSION: 1. Complex high attenuation fluid throughout the abdomen and pelvis consistent with hemoperitoneum. It is unclear whether this represents acute hemorrhage, or residual from prior hemorrhage at the time of laparoscopy. 2. Flattened inferior vena cava which may reflect hypovolemia. 3. Postsurgical changes from recent laparoscopy, with punctate foci of free gas in the upper abdomen. Percutaneous drains within the right upper quadrant and along the dome of the liver. 4. Small bilateral pleural effusions. 5. Complex left renal cyst unchanged. These results were called by telephone at the time of interpretation on 07/07/2020 at 12:57 am to provider [facility] , who verbally acknowledged these results. Electronically Signed   By: Randa Ngo M.D.   On: 07/07/2020 00:58   IR Angiogram Visceral Selective IR Angiogram Selective Each Additional Vessel IR EMBO ART  VEN HEMORR LYMPH EXTRAV  INC GUIDE ROADMAPPING  Result Date: 07/08/2020 EXAM: 1. ULTRASOUND GUIDANCE FOR ARTERIAL ACCESS 2. SELECTIVE CELIAC  ARTERIOGRAM 3. SELECTIVE PANCREATICODUODENAL ARTERIOGRAM AND PERCUTANEOUS COIL EMBOLIZATION 4. SELECTIVE GASTRODUODENAL ARTERIOGRAM AND PERCUTANEOUS COIL EMBOLIZATION 5. SELECTIVE PROPER HEPATIC ARTERIOGRAM 6. SELECTIVE ARTERIOGRAM OF ANTERIOR DIVISION OF THE RIGHT HEPATIC ARTERY AND PERCUTANEOUS PARTICLE EMBOLIZATION 7. SELECTIVE ARTERIOGRAM OF THE RIGHT HEPATIC ARTERY AND PERCUTANEOUS PARTICLE EMBOLIZATION COMPARISON:  CT abdomen and pelvis-earlier same day MEDICATIONS: None ANESTHESIA/SEDATION: Moderate (conscious) sedation was employed during this procedure. A total of Versed 4 mg and Fentanyl 200 mcg was administered intravenously. Moderate Sedation Time: 72 minutes. The patient's level of consciousness and vital signs were monitored continuously by radiology nursing throughout the procedure under my direct supervision. CONTRAST:  75 cc Omnipaque 300 FLUOROSCOPY TIME:  20 minutes, 30 seconds (0539 mGy) COMPLICATIONS: None immediate. PROCEDURE: Informed consent was obtained from the patient's daughter following explanation of the procedure, risks, benefits and alternatives. All questions were addressed. A time out was performed prior to the initiation of the procedure. Maximal barrier sterile technique utilized including caps, mask, sterile gowns, sterile gloves, large sterile drape, hand hygiene, and Betadine prep. The right femoral head was marked fluoroscopically. Under sterile conditions and local anesthesia, the right common femoral artery access was performed with a micropuncture needle. Under direct ultrasound guidance, the right common femoral was accessed with a micropuncture kit. An ultrasound image was saved for documentation purposes. This allowed for placement of a 5-French vascular sheath. A limited arteriogram was performed through the side arm of the sheath confirming appropriate access within the right common  femoral artery. Over a Bentson wire, a Mickelson catheter was advanced the caudal  aspect of the thoracic aorta where was reformed, back bled and flushed. The Mickelson catheter was then utilized to select the celiac artery and a selective celiac arteriogram was performed. Next, with the use of a fathom 14 microwire, a STC microcatheter was advanced to the level of the gastroduodenal artery and a gastroduodenal arteriogram was performed. The microcatheter was then utilized to select the pancreaticoduodenal artery and a selective pancreaticoduodenal arteriogram performed. The peripheral aspect of the pancreaticoduodenal artery was then percutaneously coil embolized with multiple overlapping 3 mm and 4 mm interlock coils. Post embolization arteriogram was performed from level of the GDA. The microcatheter was then retracted to the level of the gastroduodenal artery and the gastroduodenal artery was embolized to near its origin with multiple overlapping 5 mm and 6 mm interlock coils. The Microcatheter was drawn to the level of the common hepatic artery and a selective common hepatic arteriogram was performed. The microcatheter was removed and a celiac arteriogram was performed through the [facility] catheter. Next, with the use of a fathom 14 microwire, a high-flow microcatheter was advanced to the level of the proper hepatic artery and a proper hepatic arteriogram was performed. The microcatheter was utilized to select the anterior division of the right hepatic artery and a selective arteriogram was performed. Next, the anterior division of the right hepatic artery was percutaneously embolized with approximately 4/5 of one vial of 500-700 Embospheres. Post embolization arteriogram was performed. Next, the remainder of the right hepatic artery was particle embolized with the remaining 1 and 1/5 vials of 500-700 Embospheres. The microcatheter was retracted to the level of the common hepatic artery and completion arteriograms were performed. Images were reviewed and the procedure was terminated. All  wires and catheters were removed from the patient. Given patient's elevated INR (greater than 4), the vascular sheath was secured at the right groin access site within interrupted suture and connected to a pressure bag. The patient tolerated the procedure well without immediate post procedural complication. FINDINGS: Selective celiac arteriogram demonstrates conventional anatomy. Note is again made of parasitized vascularity from multiple divisions of the right hepatic artery as well as from a hypertrophy pancreaticoduodenal artery as was suggested on preceding CTA. Additionally, there is early AV shunting about right lobe of the liver likely secondary to patient's background of cirrhosis. Previously identified area of active arterial extravasation demonstrated on preceding abdominal CT is not definitely angiographically. Selective pancreaticoduodenal arteriogram demonstrates apparent parasitized vascularity to the gallbladder fossa, again with abnormal AV shunting. Following percutaneous coil embolization of both the pancreaticoduodenal and gastroduodenal arteries, there is complete stasis of flow through this vascular distribution. Selective arteriograms of the anterior division of the right hepatic artery as well as the main right hepatic artery demonstrates abnormal vascularity and parasitized flow about the gallbladder fossa. Following percutaneous particle embolization, there is marked reduction in flow and pruning of the peripheral vasculature throughout the right hepatic arterial vascular distribution. IMPRESSION: 1. Technically successful percutaneous coil embolization of the pancreaticoduodenal and gastroduodenal arteries felt to contribute to paired to rise parasitized abnormal flow to the gallbladder fossa. 2. Technically successful prophylactic percutaneous particle embolization of the anterior division of the right hepatic artery as well as the right hepatic artery due to abnormal vascularity about the  gallbladder fossa. PLAN: - Continue resuscitative measures as per the ICU staff. - The patient is to remain flat while vascular sheath remains in place. - Plan to remove  vascular sheath once INR below 1.5 (ideally). Electronically Signed   By: Sandi Mariscal M.D.   On: 07/08/2020 09:55    DG Chest Port 1 View  Result Date: 07/08/2020 CLINICAL DATA:  Acute respiratory failure. EXAM: PORTABLE CHEST 1 VIEW COMPARISON:  06/18/2020. FINDINGS: Endotracheal tube, NG tube, right IJ line, left IJ line in stable position. Heart size stable. Progressive dense atelectasis/infiltrate left lung base. Mild right upper and right lower lung atelectasis/infiltrates. Left pleural effusion cannot be excluded. Prominent left upper lung density is noted. This could be related to atelectasis. Left apical pleural fluid collection or hematoma could present in this fashion. CT of the chest should be considered for further evaluation. Vascular stent noted over the left chest. IMPRESSION: 1. Endotracheal tube, NG tube, right IJ line, left IJ line stable position. 2. Progressive dense atelectasis/infiltrate left lung base. Mild right upper and right lower lung atelectasis/infiltrates. 3. Prominent left upper lung density is noted. This could be related to atelectasis. Left apical pleural fluid collection or hematoma could present in this fashion. CT of the chest should be considered for further evaluation. Electronically Signed   By: Marcello Moores  Register   On: 07/08/2020 07:59   Scheduled Meds: . sodium chloride   Intravenous Once  . sodium chloride   Intravenous Once  . sodium chloride   Intravenous Once  . sodium chloride   Intravenous Once  . sodium chloride   Intravenous Once  . sodium chloride   Intravenous Once  . chlorhexidine  15 mL Mouth/Throat Once  . chlorhexidine gluconate (MEDLINE KIT)  15 mL Mouth Rinse BID  . Chlorhexidine Gluconate Cloth  6 each Topical Q0600  . darbepoetin (ARANESP) injection - DIALYSIS  100 mcg  Intravenous Q Wed-HD  . docusate  100 mg Per Tube BID  . doxercalciferol  7 mcg Intravenous Q T,Th,Sa-HD  . ketotifen  1 drop Both Eyes BID  . mouth rinse  15 mL Mouth Rinse 10 times per day  . pantoprazole  40 mg Intravenous Q12H  . polyvinyl alcohol  1 drop Both Eyes Daily  . sodium chloride flush  10-40 mL Intracatheter Q12H  . thiamine  100 mg Per Tube Daily   Continuous Infusions: .  prismasol BGK 4/2.5    .  prismasol BGK 4/2.5    . sodium chloride 10 mL/hr at 07/08/20 0106  . sodium chloride 10 mL/hr at 07/10/2020 1600  . dextrose 40 mL/hr at 07/08/20 1059  . feeding supplement (VITAL HIGH PROTEIN)    . methocarbamol (ROBAXIN) IV    . piperacillin-tazobactam (ZOSYN)  IV    . prismasol BGK 4/2.5    . sodium chloride    . sodium chloride     PRN Meds:.acetaminophen **OR** acetaminophen, acetaminophen, fentaNYL (SUBLIMAZE) injection, fentaNYL (SUBLIMAZE) injection, heparin, methocarbamol (ROBAXIN) IV, sodium chloride flush     ASSESMENT:   *   Acute cholecystitis, mass in GB on 05/2020 imaging.  Aborted lap chole 7/19 due to blood oozing and hypotension.  Proceed to cholecystostomy tube.  7/21 IR embolization of anterior division R hepatic artery and R hepatic artery.    *   Post op rise of LFTs, suspect shock liver in setting of massive bleed and hep artery embolization.    *   Choledocholithiasis.  Stone at distal CBD vs distal cystic duct w upstream ductal dilatation per MRCP.  Unsuccessful ERCP 7/18 per Dr Carlean Purl.    *    Cirrhosis of liver.  Hep C, s/p treatment.  Acute rise LFTs c/w shock liver.  t bili, alk phos rising but transaminases improved.     *   Hepatocellular CA, eradicted w thermoablation.  *   Coagulopathy. Thrombocytopenia.  S/p 15 FFP. 7 platelets, 5 cryo   Platelets improved.  INR normalized.  .    *   Anemia.  S/p 15 PRBCs.  Hgb 7.9, improved.    *   ESRD/  On HD TTS but CRRT initiated today  *   AMS, encephalopathy.  Intubated on vent for  airway protection.     PLAN   *   Check ammonia level.  Add lactulose and/or Rifaximin if significantly elevated.   Follow CBC and transfuse additional blood products as needed.    *    Contrast study via chole tube when stable.  If persistent obstructing choledocholithiasis, consider IR drain placement to duct.  Azucena Freed  07/08/2020, 1:02 PM Phone [phone]  GI ATTENDING  Interval history, data, and IR embolization reviewed. Agree with interval progress note as outlined above. She is stable with several parameters improved, though she remains critically ill. Current patient status and plans reviewed with Dr. Georgette Dover. At this juncture, GI available as needed.  Docia Chuck. Geri Seminole., M.D. [facility] Division of Gastroenterology

## 2020-07-08 NOTE — Progress Notes (Signed)
[place] Progress Note Patient Name: TIMOTHEA BODENHEIMER DOB: [birth year] MRN: [id number]   Date of Service  07/08/2020  HPI/Events of Note  Patient has some bleeding from IR sheath access site.  eICU Interventions  Thrombin pad ordered, CBC, PT-INR, PTT ordered.        Kerry Kass Milanie Rosenfield 07/08/2020, 11:12 PM

## 2020-07-08 NOTE — Progress Notes (Signed)
CRITICAL VALUE ALERT  Critical Value:  CBG 67  Date & Time Notied:  07/08/2020 at 1900   Provider Notified: [facility] doctor (RN waiting for a call back as of 1910  Orders Received/Actions taken:  D50 given in the meantime, per standing orders.

## 2020-07-08 NOTE — Procedures (Signed)
Bronchoscopy Procedure Note  Sarah Harmon  [id number]  Aug 31, 1946  Date:07/08/20  Time:6:16 PM   Provider Performing:Eman Rynders P Carlis Abbott   Procedure(s):  Flexible bronchoscopy with bronchial alveolar lavage [phone])  Indication(s) Mucus plugging  Consent Risks of the procedure as well as the alternatives and risks of each were explained to the patient and/or caregiver.  Consent for the procedure was obtained and is signed in the bedside chart  Anesthesia Fentanyl 142mg  Time Out Verified patient identification, verified procedure, site/side was marked, verified correct patient position, special equipment/implants available, medications/allergies/relevant history reviewed, required imaging and test results available.   Sterile Technique Usual hand hygiene, masks, and gloves were used   Procedure Description Bronchoscope advanced through endotracheal tube and into airway.  Airways were examined down to subsegmental level with findings noted below.   Following diagnostic evaluation, bronchial lavage LUL, LLL, lingula with return of mucoid material. No large airway plugs.    Complications/Tolerance None; patient tolerated the procedure well. Chest X-ray is not needed post procedure.   EBL 0   Specimen(s) None  LJulian Hy DO 07/08/20 6:18 PM [facility]

## 2020-07-08 NOTE — Progress Notes (Signed)
[place] Progress Note Patient Name: Sarah Harmon DOB: 01-10-1946 MRN: [id number]   Date of Service  07/08/2020  HPI/Events of Note  Coagulopathy - INR = 2.1.   eICU Interventions  Plan: 1. Transfuse 3 units FFP. 2. Repeat PT/INR in AM.      Intervention Category Major Interventions: Other:  Lysle Dingwall 07/08/2020, 12:43 AM

## 2020-07-08 NOTE — Progress Notes (Signed)
[place] Progress Note Patient Name: Sarah Harmon DOB: Mar 04, 1946 MRN: [id number]   Date of Service  07/08/2020  HPI/Events of Note  Blood sugar 40 mg % on D 10 infusion at 40 ml / hour.  eICU Interventions  D 10 infusion rate increased to 75  ml / hour        Zanita Millman U Zailey Audia 07/08/2020, 8:14 PM

## 2020-07-09 ENCOUNTER — Inpatient Hospital Stay (HOSPITAL_COMMUNITY): Payer: Medicare Other

## 2020-07-09 DIAGNOSIS — Z7189 Other specified counseling: Secondary | ICD-10-CM

## 2020-07-09 HISTORY — PX: IR CHOLANGIOGRAM EXISTING TUBE: IMG6040

## 2020-07-09 LAB — PREPARE FRESH FROZEN PLASMA
Unit division: 0
Unit division: 0
Unit division: 0
Unit division: 0

## 2020-07-09 LAB — CBC WITH DIFFERENTIAL/PLATELET
Abs Immature Granulocytes: 0.16 10*3/uL — ABNORMAL HIGH (ref 0.00–0.07)
Basophils Absolute: 0.1 10*3/uL (ref 0.0–0.1)
Basophils Relative: 1 %
Eosinophils Absolute: 0 10*3/uL (ref 0.0–0.5)
Eosinophils Relative: 0 %
HCT: 24.7 % — ABNORMAL LOW (ref 36.0–46.0)
Hemoglobin: 7.8 g/dL — ABNORMAL LOW (ref 12.0–15.0)
Immature Granulocytes: 1 %
Lymphocytes Relative: 2 %
Lymphs Abs: 0.3 10*3/uL — ABNORMAL LOW (ref 0.7–4.0)
MCH: 28.8 pg (ref 26.0–34.0)
MCHC: 31.6 g/dL (ref 30.0–36.0)
MCV: 91.1 fL (ref 80.0–100.0)
Monocytes Absolute: 0.7 10*3/uL (ref 0.1–1.0)
Monocytes Relative: 5 %
Neutro Abs: 13.7 10*3/uL — ABNORMAL HIGH (ref 1.7–7.7)
Neutrophils Relative %: 91 %
Platelets: 86 10*3/uL — ABNORMAL LOW (ref 150–400)
RBC: 2.71 MIL/uL — ABNORMAL LOW (ref 3.87–5.11)
RDW: 15.7 % — ABNORMAL HIGH (ref 11.5–15.5)
WBC: 14.8 10*3/uL — ABNORMAL HIGH (ref 4.0–10.5)
nRBC: 51.1 % — ABNORMAL HIGH (ref 0.0–0.2)

## 2020-07-09 LAB — CBC
HCT: 22.4 % — ABNORMAL LOW (ref 36.0–46.0)
Hemoglobin: 7.1 g/dL — ABNORMAL LOW (ref 12.0–15.0)
MCH: 29 pg (ref 26.0–34.0)
MCHC: 31.7 g/dL (ref 30.0–36.0)
MCV: 91.4 fL (ref 80.0–100.0)
Platelets: 66 10*3/uL — ABNORMAL LOW (ref 150–400)
RBC: 2.45 MIL/uL — ABNORMAL LOW (ref 3.87–5.11)
RDW: 15.5 % (ref 11.5–15.5)
WBC: 13.7 10*3/uL — ABNORMAL HIGH (ref 4.0–10.5)
nRBC: 56.3 % — ABNORMAL HIGH (ref 0.0–0.2)

## 2020-07-09 LAB — POCT I-STAT 7, (LYTES, BLD GAS, ICA,H+H)
Acid-Base Excess: 0 mmol/L (ref 0.0–2.0)
Bicarbonate: 24.4 mmol/L (ref 20.0–28.0)
Calcium, Ion: 1.01 mmol/L — ABNORMAL LOW (ref 1.15–1.40)
HCT: 22 % — ABNORMAL LOW (ref 36.0–46.0)
Hemoglobin: 7.5 g/dL — ABNORMAL LOW (ref 12.0–15.0)
O2 Saturation: 88 %
Patient temperature: 96.3
Potassium: 3.9 mmol/L (ref 3.5–5.1)
Sodium: 137 mmol/L (ref 135–145)
TCO2: 25 mmol/L (ref 22–32)
pCO2 arterial: 35.5 mmHg (ref 32.0–48.0)
pH, Arterial: 7.439 (ref 7.350–7.450)
pO2, Arterial: 48 mmHg — ABNORMAL LOW (ref 83.0–108.0)

## 2020-07-09 LAB — RENAL FUNCTION PANEL
Albumin: 3.7 g/dL (ref 3.5–5.0)
Albumin: 3.6 g/dL (ref 3.5–5.0)
Anion gap: 15 (ref 5–15)
Anion gap: 15 (ref 5–15)
BUN: 9 mg/dL (ref 8–23)
BUN: 9 mg/dL (ref 8–23)
CO2: 23 mmol/L (ref 22–32)
CO2: 22 mmol/L (ref 22–32)
Calcium: 9.9 mg/dL (ref 8.9–10.3)
Calcium: 10 mg/dL (ref 8.9–10.3)
Chloride: 97 mmol/L — ABNORMAL LOW (ref 98–111)
Chloride: 98 mmol/L (ref 98–111)
Creatinine, Ser: 1.97 mg/dL — ABNORMAL HIGH (ref 0.44–1.00)
Creatinine, Ser: 1.75 mg/dL — ABNORMAL HIGH (ref 0.44–1.00)
GFR calc Af Amer: 28 mL/min — ABNORMAL LOW (ref >60)
GFR calc Af Amer: 32 mL/min — ABNORMAL LOW (ref >60)
GFR calc non Af Amer: 24 mL/min — ABNORMAL LOW (ref >60)
GFR calc non Af Amer: 28 mL/min — ABNORMAL LOW (ref >60)
Glucose, Bld: 144 mg/dL — ABNORMAL HIGH (ref 70–99)
Glucose, Bld: 132 mg/dL — ABNORMAL HIGH (ref 70–99)
Phosphorus: 3.9 mg/dL (ref 2.5–4.6)
Phosphorus: 3.4 mg/dL (ref 2.5–4.6)
Potassium: 3.7 mmol/L (ref 3.5–5.1)
Potassium: 3.8 mmol/L (ref 3.5–5.1)
Sodium: 135 mmol/L (ref 135–145)
Sodium: 135 mmol/L (ref 135–145)

## 2020-07-09 LAB — CULTURE, BLOOD (ROUTINE X 2)
Culture: NO GROWTH
Culture: NO GROWTH

## 2020-07-09 LAB — GLUCOSE, CAPILLARY
Glucose-Capillary: 110 mg/dL — ABNORMAL HIGH (ref 70–99)
Glucose-Capillary: 118 mg/dL — ABNORMAL HIGH (ref 70–99)
Glucose-Capillary: 124 mg/dL — ABNORMAL HIGH (ref 70–99)
Glucose-Capillary: 125 mg/dL — ABNORMAL HIGH (ref 70–99)
Glucose-Capillary: 129 mg/dL — ABNORMAL HIGH (ref 70–99)
Glucose-Capillary: 133 mg/dL — ABNORMAL HIGH (ref 70–99)
Glucose-Capillary: 134 mg/dL — ABNORMAL HIGH (ref 70–99)
Glucose-Capillary: 135 mg/dL — ABNORMAL HIGH (ref 70–99)
Glucose-Capillary: 138 mg/dL — ABNORMAL HIGH (ref 70–99)
Glucose-Capillary: 143 mg/dL — ABNORMAL HIGH (ref 70–99)
Glucose-Capillary: 147 mg/dL — ABNORMAL HIGH (ref 70–99)

## 2020-07-09 LAB — COMPREHENSIVE METABOLIC PANEL
ALT: 2059 U/L — ABNORMAL HIGH (ref 0–44)
ALT: 1453 U/L — ABNORMAL HIGH (ref 0–44)
AST: 6355 U/L — ABNORMAL HIGH (ref 15–41)
AST: 3737 U/L — ABNORMAL HIGH (ref 15–41)
Albumin: 3.5 g/dL (ref 3.5–5.0)
Albumin: 3.5 g/dL (ref 3.5–5.0)
Alkaline Phosphatase: 148 U/L — ABNORMAL HIGH (ref 38–126)
Alkaline Phosphatase: 185 U/L — ABNORMAL HIGH (ref 38–126)
Anion gap: 20 — ABNORMAL HIGH (ref 5–15)
Anion gap: 15 (ref 5–15)
BUN: 11 mg/dL (ref 8–23)
BUN: 9 mg/dL (ref 8–23)
CO2: 22 mmol/L (ref 22–32)
CO2: 21 mmol/L — ABNORMAL LOW (ref 22–32)
Calcium: 9.4 mg/dL (ref 8.9–10.3)
Calcium: 9.4 mg/dL (ref 8.9–10.3)
Chloride: 95 mmol/L — ABNORMAL LOW (ref 98–111)
Chloride: 97 mmol/L — ABNORMAL LOW (ref 98–111)
Creatinine, Ser: 3.05 mg/dL — ABNORMAL HIGH (ref 0.44–1.00)
Creatinine, Ser: 2.17 mg/dL — ABNORMAL HIGH (ref 0.44–1.00)
GFR calc Af Amer: 17 mL/min — ABNORMAL LOW (ref >60)
GFR calc Af Amer: 25 mL/min — ABNORMAL LOW (ref >60)
GFR calc non Af Amer: 14 mL/min — ABNORMAL LOW (ref >60)
GFR calc non Af Amer: 22 mL/min — ABNORMAL LOW (ref >60)
Glucose, Bld: 118 mg/dL — ABNORMAL HIGH (ref 70–99)
Glucose, Bld: 144 mg/dL — ABNORMAL HIGH (ref 70–99)
Potassium: 3.4 mmol/L — ABNORMAL LOW (ref 3.5–5.1)
Potassium: 3.8 mmol/L (ref 3.5–5.1)
Sodium: 137 mmol/L (ref 135–145)
Sodium: 133 mmol/L — ABNORMAL LOW (ref 135–145)
Total Bilirubin: 6.8 mg/dL — ABNORMAL HIGH (ref 0.3–1.2)
Total Bilirubin: 11.9 mg/dL — ABNORMAL HIGH (ref 0.3–1.2)
Total Protein: 5.9 g/dL — ABNORMAL LOW (ref 6.5–8.1)
Total Protein: 6.3 g/dL — ABNORMAL LOW (ref 6.5–8.1)

## 2020-07-09 LAB — PROTIME-INR
INR: 2.6 — ABNORMAL HIGH (ref 0.8–1.2)
Prothrombin Time: 26.9 seconds — ABNORMAL HIGH (ref 11.4–15.2)

## 2020-07-09 LAB — BPAM FFP
Blood Product Expiration Date: 202107232359
Blood Product Expiration Date: 202107232359
Blood Product Expiration Date: 202107242359
Blood Product Expiration Date: 202107252359
ISSUE DATE / TIME: 202107220119
ISSUE DATE / TIME: 202107220208
ISSUE DATE / TIME: 202107220208
ISSUE DATE / TIME: 202107220830
Unit Type and Rh: 5100
Unit Type and Rh: 5100
Unit Type and Rh: 6200
Unit Type and Rh: 6200

## 2020-07-09 LAB — MAGNESIUM: Magnesium: 1.9 mg/dL (ref 1.7–2.4)

## 2020-07-09 LAB — AMMONIA: Ammonia: 33 umol/L (ref 9–35)

## 2020-07-09 LAB — DIC (DISSEMINATED INTRAVASCULAR COAGULATION)PANEL
D-Dimer, Quant: >20 ug/mL-FEU — ABNORMAL HIGH (ref 0.00–0.50)
Fibrinogen: 256 mg/dL (ref 210–475)
INR: 2.5 — ABNORMAL HIGH (ref 0.8–1.2)
Platelets: 84 10*3/uL — ABNORMAL LOW (ref 150–400)
Prothrombin Time: 26.5 seconds — ABNORMAL HIGH (ref 11.4–15.2)
Smear Review: NONE SEEN
aPTT: 45 seconds — ABNORMAL HIGH (ref 24–36)

## 2020-07-09 LAB — APTT: aPTT: 46 seconds — ABNORMAL HIGH (ref 24–36)

## 2020-07-09 MED ORDER — SODIUM CHLORIDE 0.9% IV SOLUTION: Freq: Once | INTRAVENOUS | Status: AC

## 2020-07-09 MED ORDER — B COMPLEX-C PO TABS
1.0000 | ORAL_TABLET | Freq: Every day | ORAL | Status: DC
  Administered 2020-07-10 – 2020-07-12 (×2): 1
  Filled 2020-07-09 (×2): qty 1

## 2020-07-09 MED ORDER — IOHEXOL 300 MG/ML  SOLN
50.0000 mL | Freq: Once | INTRAMUSCULAR | Status: AC | PRN
  Administered 2020-07-09: 50 mL

## 2020-07-09 MED ORDER — CALCIUM GLUCONATE-NACL 1-0.675 GM/50ML-% IV SOLN
1.0000 g | Freq: Once | INTRAVENOUS | Status: AC
  Administered 2020-07-09: 1000 mg via INTRAVENOUS
  Filled 2020-07-09: qty 50

## 2020-07-09 MED ORDER — VITAL HIGH PROTEIN PO LIQD
1000.0000 mL | ORAL | Status: DC
  Administered 2020-07-09 – 2020-07-10 (×3): 1000 mL

## 2020-07-09 MED ORDER — LACTULOSE 10 GM/15ML PO SOLN
10.0000 g | Freq: Three times a day (TID) | ORAL | Status: DC
  Administered 2020-07-09 – 2020-07-12 (×10): 10 g
  Filled 2020-07-09 (×10): qty 15

## 2020-07-09 MED ORDER — PHYTONADIONE 5 MG PO TABS
2.5000 mg | ORAL_TABLET | Freq: Every day | ORAL | Status: DC
  Administered 2020-07-09 – 2020-07-10 (×2): 2.5 mg
  Filled 2020-07-09 (×3): qty 1

## 2020-07-09 NOTE — Progress Notes (Signed)
NAME:  AZHANE ECKART, MRN:  [id number], DOB:  [birth year], LOS: 6 ADMISSION DATE:  06/17/2020, CONSULTATION DATE:  06/25/2020 REFERRING MD: Kayleen Memos, DO CHIEF COMPLAINT:  Hemorrhagic shock   Brief History   PCCM consulted for admission to ICU after complicated cholecystectomy. Patient had Abnormal blood loss and needing massive transfusion.    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 As of 7/19 had received 7 u PRBC, 2 FFP and 1 plt. Still on high pressors.  7/20 intubated. Still in sig shock (in spite of more blood products (up to 9 units PRBC), three pressors and bicarb). Made DNR. Did not tol iHD. Plan to start CRRT.  7/21: Hgb cont to drop. 2 more PRBC.  CT abd/pelvis obtained. Reviewed by surgery: "CT are not unexpected and the patient's hemodynamics, vasopressor requirement, and response to blood transfusion do not suggest ongoing blood loss that would necessitate laparotomy. Recommend continued balanced resuscitation guided by TEG and maintenance of normothermia. Will continue to follow". Could not get HD cath to work for CRRT. Off pressors. Seems volume responsive. PLTs 51. Holding stable. INR 2.1. . Examination is positive for area of active arterial extravasation adjacent to the gallbladder fossa with associated pooling within the right upper abdomen likely supplied several branches of the anterior division of the right hepatic artery as well as potential supply from the pancreaticoduodenal branch of the GDA. Marland Kitchen  Having bloody stool went for CT angiogram underwent arteriogram with percutaneous coil embolization of the  pancreatic duodenal artery and GDA as well as embolization of the right hepatic artery. 7/22 hemodynamics improved starting to have episodes of hypoglycemia with glucose as low as 30 D10 infusion initiated still coagulopathic getting FFP.  Ventilator mechanics demonstrating concern for transfusion related lung injury, and protected tidal volume ventilation initiated.  CRRT reinitiated. 7/23: Off pressors.  Tolerating CRRT.  Interactive .  Both JP and cholecystostomy drain with serosanguineous drainage more bloody than serous.  Now pending transfer to radiology for cholangiogram through cholecystostomy tube.  Hemoglobin sitting at 7.1 down from 7.81-day ago  Consults:  Nephrology PCCM GI Surgery IR Procedures:  7/18 ERCP 7/19 Lap Chole  7/20 intubated for encephalopathy 7/20 trialysis catheter placement 7/21 IR embolization  Significant Diagnostic Tests:  7/17 MRCP :  Choledocholithiasis, with a 1.3 cm distal common duct stone. Gallbladder distension with mild pericholecystic edema. Findings are suspicious for acute cholecystitis. 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 Cirrhosis and segment 8 ablation defect, suboptimally evaluated without gross recurrent or metachronous hepatocellular carcinoma Hemosiderosis.Trace abdominal ascites. Similar upper pole left renal complex cystic lesion, incompletely 7/21 CT abd/pelvis: 1. Complex high attenuation fluid throughout the abdomen and pelvis consistent with hemoperitoneum.  unclear whether this represents acute hemorrhage, or residual from prior hemorrhage at the time of laparoscopy. Flattened inferior vena cava which may reflect hypovolemia. Postsurgical changes from recent laparoscopy, with punctate foci of free gas in the upper abdomen. Percutaneous drains within the right upper quadrant and along the dome of the liver. Small bilateral  pleural effusions. 7/21: 1. Examination is positive for area of active arterial extravasation adjacent to the gallbladder fossa with associated pooling within  the right upper abdomen likely supplied several branches of the anterior division of the right hepatic artery as well as potential supply from the pancreaticoduodenal branch of the GDA. 2. Multiple mixed attenuating fluid collections are seen throughout the abdomen and pelvis, nearly all of which contain hematocrit levels, compatible with of evolving hematomas. 3. Scattered atherosclerotic plaque within normal caliber abdominal aorta.4. Suspected approximately 50% luminal narrowing involving theorigin the celiac artery.  Micro Data:  7/18 blood>>   Antimicrobials:  Zosyn 7/17 >>  Interim history/subjective:  Off pressors   Objective   Blood pressure (Abnormal) 57/41, pulse 62, temperature (Abnormal) 96.8 F (36 C), resp. rate 21, height 5\' 3"  (1.6 m), weight 69.2 kg, SpO2 98 %.    Vent Mode: PRVC FiO2 (%):  [50 %-75 %] 50 % Set Rate:  [20 bmp] 20 bmp Vt Set:  [360 mL] 360 mL PEEP:  [8 cmH20-10 cmH20] 8 cmH20 Plateau Pressure:  [24 cmH20-28 cmH20] 24 cmH20   Intake/Output Summary (Last 24 hours) at 07/09/2020 0949 Last data filed at 07/09/2020 8250 Gross per 24 hour  Intake 2642.18 ml  Output 4820 ml  Net -2177.82 ml   Filed Weights   07/07/20 0700 07/07/20 1124 07/09/20 0500  Weight: 67.9 kg 69 kg 69.2 kg    Examination: General: This is a frail appearing 74 year old critically ill female remains on full ventilator support as well as CRRT HEENT normocephalic atraumatic orally intubated.  Right IJ HD catheter dressing is unremarkable Pulmonary: Diminished bases, no accessory use.  Currently weaning PEEP/FiO2, had been on 10 and 60, now down to 8 and 50, seems to be tolerating this Cardiac: Regular rate and rhythm without murmur rub or gallop Abdomen: Distended but soft, tender.  Drains intact with bloody serous  output GU: Minimal urine output Extremities: Warm dry Neuro: Awake, interactive, follows commands,   Resolved Hospital Problem list   Shock: Multifactorial: Hemorrhagic and hypovolemic Lactic acidosis/anion gap metabolic acidosis resolved Assessment & Plan:  Choledocholithiasis with acute cholecystitis status post attempted lap chole complicated by intraoperative acute blood loss and hemorrhagic shock->still has CBD obstrution Plan Continue Zosyn, dated therapy to be determined still, will likely need to keep these in while she has drains  She is planned for cholangiogram today in an effort to further evaluate CBD blockage  Ongoing management to to be determined by both surgical and interventional radiology team    Acute hypoxic vent dependent respiratory failure. Intubated for progressive encephalopathy.  Ongoing ventilator needs likely exacerbated by transfusion related lung injury -We are actively weaning PEEP and FiO2 Plan Continue volume removal as tolerated via CRRT Wean PEEP/FiO2, hopefully we can try pressure support ventilation trial later today VAP bundle PAD protocol with RASS goal -1 A.m. chest x-ray We will get follow-up arterial blood gas today, we were significantly over ventilating her on last ABG  ESRD w/ fluid and electrolyte imbalance on TThS Plan Continuing CRRT to maximize volume status and facilitate ventilator weaning Continue to check serial chemistries Replace electrolytes as needed  Cirrhosis 2/2 chronic (former) ETOH use; currently decompensated due to critical illness, hypoperfusion. Plan Continue to trend LFTs  Acute on Chronic thrombocytopenia due to active bleeding superimposed on underlying cirrhosis Plan Continue to monitor Transfuse for platelet count less than 50,000 given propensity to bleeding A.m. CBC  Acute blood loss anemia and coagulopathy 2/2 cirrhosis, consumptive from bleeding from active mesenteric bleeding.  Received 3 more  units of FFP on 7/22 -Status post embolization embolization of the pancreatic duodenal  artery and GDA as well as embolization of the right hepatic artery.  On 7/21, since then hemoglobin seems to be stabilizing -Remains coagulopathic Plan We will give IV vitamin K, already got FFP A.m. INR Transfuse for hemoglobin less than 7  RUE swelling ? IV infiltrate vs DVT -not candidate for systemic therapeutic ac Plan Continue to keep elevated, consider right upper extremity ultrasound if stabilizes although she is not a anticoagulation candidate   Hypoglycemia likely due to cirrhosis and acute illness Plan Continue D10 with every 2 hour Accu-Cheks  Acute encephalopathy- resolved this morning Plan Supportive care  Best practice:  Diet: trickle TF, continue Pain/Anxiety/Delirium protocol (if indicated): yes VAP protocol (if indicated): yes DVT prophylaxis: SCDs GI prophylaxis: pantoprazole Glucose control:  Mobility: Bed rest Code Status: Full Family Communication: Pending for 7/23 Disposition: ICU  Labs   CBC: Recent Labs  Lab 06/26/2020 0026 06/21/2020 0628 06/17/2020 2034 06/18/2020 2057 07/07/20 2059 07/07/20 2059 07/08/20 0512 07/08/20 1844 07/08/20 2342 07/09/20 0058 07/09/20 0630  WBC 7.5   < > 9.8   < > 9.0  --  12.9* 15.7* 14.8*  --  13.7*  NEUTROABS 6.0  --  8.3*  --   --   --   --   --  13.7*  --   --   HGB 8.4*   < > 10.6*   < > 7.7*  --  7.9* 8.0* 7.8*  --  7.1*  HCT 25.6*   < > 32.9*   < > 23.4*  --  23.6* 25.4* 24.7*  --  22.4*  MCV 91.1   < > 93.2   < > 88.0  --  89.4 91.0 91.1  --  91.4  PLT 99*   < > 79*   < > 187   < > 129* 99* 86* 84* 66*   < > = values in this interval not displayed.    Basic Metabolic Panel: Recent Labs  Lab 06/24/2020 0026 06/21/2020 6834 06/20/2020 2034 06/19/2020 2057 07/07/20 0042 07/07/20 0042 07/07/20 0546 07/07/20 0546 07/07/20 2059 07/08/20 0512 07/08/20 1600 07/09/20 0112 07/09/20 0452  NA 129*   < > 133*   < > 140   < >  141   < > 137 139 137 133* 135  K 3.9   < > 6.1*   < > 4.5   < > 4.3   < > 3.4* 3.7 4.0 3.8 3.7  CL 91*   < > 104   < > 99   < > 100   < > 95* 95* 96* 97* 97*  CO2 26   < > 13*   < > 9*   < > 11*   < > 22 20* 22 21* 23  GLUCOSE 93   < > 112*   < > 146*   < > 140*   < > 118* 75 87 144* 144*  BUN 20   < > 34*   < > 30*   < > 30*   < > 11 14 13 9 9   CREATININE 4.49*   < > 5.82*   < > 5.98*   < > 6.08*   < > 3.05* 3.39* 3.00* 2.17* 1.97*  CALCIUM 8.8*   < > 6.8*   < > 9.7   < > 9.4   < > 9.4 10.8* 9.9 9.4 9.9  MG 1.8  --  1.8  --   --   --  1.8  --   --   --   --  1.9  --   PHOS  --   --   --   --  8.6*  --  8.2*  --   --  7.4* 6.4*  --  3.9   < > = values in this interval not displayed.   GFR: Estimated Creatinine Clearance: 23 mL/min (A) (by C-G formula based on SCr of 1.97 mg/dL (H)). Recent Labs  Lab 07/15/2020 2034 07/03/2020 2037 06/22/2020 0519 06/21/2020 1253 07/08/20 0512 07/08/20 1844 07/08/20 2342 07/09/20 0630  WBC   < >  --  6.8   < > 12.9* 15.7* 14.8* 13.7*  LATICACIDVEN  --  7.9* >11.0*  --   --   --   --   --    < > = values in this interval not displayed.    Liver Function Tests: Recent Labs  Lab 06/23/2020 0519 07/07/20 0042 07/07/20 0546 07/07/20 0546 07/07/20 2059 07/08/20 0512 07/08/20 1600 07/09/20 0112 07/09/20 0452  AST 2,666*  --  >10,000*  --  6,355* 5,112*  --  3,737*  --   ALT 1,035*  --  3,977*  --  2,059* 1,698*  --  1,453*  --   ALKPHOS 63  --  174*  --  148* 161*  --  185*  --   BILITOT 6.8*  --  7.7*  --  6.8* 8.9*  --  11.9*  --   PROT 5.2*  --  5.0*  --  5.9* 6.5  --  6.3*  --   ALBUMIN 2.5*   < > 2.8*   < > 3.5 3.9 3.6 3.5 3.7   < > = values in this interval not displayed.   Recent Labs  Lab 06/19/2020 1506  LIPASE 34   Recent Labs  Lab 07/09/20 0452  AMMONIA 33    ABG    Component Value Date/Time   PHART 7.491 (H) 06/22/2020 0356   PCO2ART 19.0 (LL) 06/19/2020 0356   PO2ART 80 (L) 06/25/2020 0356   HCO3 15.0 (L) 06/27/2020 0356    TCO2 16 (L) 06/25/2020 0356   ACIDBASEDEF 8.0 (H) 07/08/2020 0356   O2SAT 98.0 07/14/2020 0356     Coagulation Profile: Recent Labs  Lab 07/07/20 2059 07/08/20 0512 07/08/20 1844 07/08/20 2342 07/09/20 0058  INR 2.1* 1.8* 2.4* 2.6* 2.5*    Cardiac Enzymes: No results for input(s): CKTOTAL, CKMB, CKMBINDEX, TROPONINI in the last 168 hours.  HbA1C: Hgb A1c MFr Bld  Date/Time Value Ref Range Status  12/01/2019 09:40 AM 4.4 (L) 4.6 - 6.5 % Final    Comment:    Glycemic Control Guidelines for People with Diabetes:Non Diabetic:  <6%Goal of Therapy: <7%Additional Action Suggested:  >8%   06/06/2019 12:25 PM 5.1 4.6 - 6.5 % Final    Comment:    Glycemic Control Guidelines for People with Diabetes:Non Diabetic:  <6%Goal of Therapy: <7%Additional Action Suggested:  >8%     CBG: Recent Labs  Lab 07/09/20 0029 07/09/20 0203 07/09/20 0438 07/09/20 0641 07/09/20 0721  GLUCAP 143* 138* 147* 134* 124*    My critical care time is 33 minutes  Erick Colace ACNP-BC [place] Pager # [phone] OR # [phone] if no answer

## 2020-07-09 NOTE — Progress Notes (Signed)
PT Cancellation Note  Patient Details Name: Sarah Harmon MRN: [id number] DOB: May 10, 1946   Cancelled Treatment:    Reason Eval/Treat Not Completed: Medical issues which prohibited therapy.  Unable per RN,  Pt on CRRT with femoral sheath catheter. 07/09/2020  [name], PT [facility] [phone]  (pager) [phone]  (office)   [name] 07/09/2020, 1:31 PM

## 2020-07-09 NOTE — Progress Notes (Signed)
4 Days Post-Op    CC: Abdominal pain  Subjective: Patient sedated on the vent.  She is also on continuous hemodialysis.  JP drain, and cholecystostomy drainage of both are serosanguineous, more bloody than serous.  No bowel sounds, abdomen still mildly distended.  Objective: Vital signs in last 24 hours: Temp:  [91.6 F (33.1 C)-98 F (36.7 C)] 96.8 F (36 C) (07/23 0900) Pulse Rate:  [51-78] 62 (07/23 0900) Resp:  [15-25] 21 (07/23 0900) BP: (57-153)/(26-74) 57/41 (07/23 0800) SpO2:  [89 %-99 %] 98 % (07/23 0900) Arterial Line BP: (99-160)/(39-56) 140/41 (07/23 0900) FiO2 (%):  [50 %-75 %] 50 % (07/23 0835) Weight:  [69.2 kg] 69.2 kg (07/23 0500) Last BM Date: 07/08/20 2200 IV Drain 40 HD 4331 TF 210 No BM recorded Afebrile, cuff pressure are low, but A line pressures are good On Vent  PRVC with FiO2 50% K+ 3.7 WBC 13.7 H/H 7.1/22.4 Platelets 66K INR 1.2>2.1>2.1>1.8>2.5 Intake/Output from previous day: 07/22 0701 - 07/23 0700 In: 2627 [I.V.:1761; Blood:556; NG/GT:210; IV Piggyback:100] Out: 4371 [Drains:40] Intake/Output this shift: Total I/O In: 170 [I.V.:150; NG/GT:20] Out: 449 [Drains:50; Other:399]  General appearance: Sedated on the vent. Resp: Full vent support GI: She is sedated, abdomen is mildly distended, she was not tender on exam.  2 drains JP and cholecystostomy tube with mostly sanguinous/serous drainage.  Lab Results:  Recent Labs    07/08/20 2342 07/08/20 2342 07/09/20 0058 07/09/20 0630  WBC 14.8*  --   --  13.7*  HGB 7.8*  --   --  7.1*  HCT 24.7*  --   --  22.4*  PLT 86*   < > 84* 66*   < > = values in this interval not displayed.    BMET Recent Labs    07/09/20 0112 07/09/20 0452  NA 133* 135  K 3.8 3.7  CL 97* 97*  CO2 21* 23  GLUCOSE 144* 144*  BUN 9 9  CREATININE 2.17* 1.97*  CALCIUM 9.4 9.9   PT/INR Recent Labs    07/08/20 2342 07/09/20 0058  LABPROT 26.9* 26.5*  INR 2.6* 2.5*    Recent Labs  Lab  06/17/2020 0519 07/07/20 0042 07/07/20 0546 07/07/20 0546 07/07/20 2059 07/08/20 0512 07/08/20 1600 07/09/20 0112 07/09/20 0452  AST 2,666*  --  >10,000*  --  6,355* 5,112*  --  3,737*  --   ALT 1,035*  --  3,977*  --  2,059* 1,698*  --  1,453*  --   ALKPHOS 63  --  174*  --  148* 161*  --  185*  --   BILITOT 6.8*  --  7.7*  --  6.8* 8.9*  --  11.9*  --   PROT 5.2*  --  5.0*  --  5.9* 6.5  --  6.3*  --   ALBUMIN 2.5*   < > 2.8*   < > 3.5 3.9 3.6 3.5 3.7   < > = values in this interval not displayed.     Lipase     Component Value Date/Time   LIPASE 34 06/19/2020 1506     Medications: . sodium chloride   Intravenous Once  . sodium chloride   Intravenous Once  . sodium chloride   Intravenous Once  . sodium chloride   Intravenous Once  . sodium chloride   Intravenous Once  . sodium chloride   Intravenous Once  . chlorhexidine  15 mL Mouth/Throat Once  . chlorhexidine gluconate (MEDLINE KIT)  15 mL Mouth  Rinse BID  . Chlorhexidine Gluconate Cloth  6 each Topical Q0600  . darbepoetin (ARANESP) injection - DIALYSIS  100 mcg Intravenous Q Wed-HD  . docusate  100 mg Per Tube BID  . doxercalciferol  7 mcg Intravenous Q T,Th,Sa-HD  . ketotifen  1 drop Both Eyes BID  . mouth rinse  15 mL Mouth Rinse 10 times per day  . pantoprazole  40 mg Intravenous Q12H  . polyvinyl alcohol  1 drop Both Eyes Daily  . sodium chloride flush  10-40 mL Intracatheter Q12H  . thiamine  100 mg Per Tube Daily   .  prismasol BGK 4/2.5 300 mL/hr at 07/09/20 0624  .  prismasol BGK 4/2.5 200 mL/hr at 07/08/20 1339  . sodium chloride 10 mL/hr at 07/08/20 1800  . sodium chloride 10 mL/hr at 07/08/2020 1600  . dextrose 75 mL/hr at 07/09/20 0900  . feeding supplement (VITAL HIGH PROTEIN) 1,000 mL (07/08/20 1404)  . methocarbamol (ROBAXIN) IV    . piperacillin-tazobactam (ZOSYN)  IV 3.375 g (07/08/20 2319)  . prismasol BGK 4/2.5 1,500 mL/hr at 07/09/20 0320  . sodium chloride    . sodium chloride       Assessment/Plan ESRD on HD T/Th/Sat Hepatocellular carcinoma(dx 02/2015 S/P thermoablation follows with Dr. 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 Donnie Mesa, POD# 4  - aborted due to diffuse oozing and hypotension; cholecystostomy tube placed/ drain placed  -CT angio - embolization of neovascular arterial branch near the GB fossa 7/21   Liver failure - cirrhosis exacerbated by surgery, now with diffuse pancytopenia, coagulopathy.  LFT's improving slightly, although T. Bili still elevated due to CBD obstruction  Acute blood loss anemia - seems to be stabilized after IR embolization; try to normalize INR; monitor Hgb and platelets ID - zosyn 7/17>> day 7 VTE - SCDs, sq heparin held 7/22 FEN - NPO/IV fluids Foley - none Follow up - TBD  Plan: Patient is on continuous hemodialysis, and full vent support/sedated.  Will discuss possible cholangiogram through the cholecystostomy tube with Dr. Georgette Dover.  Continue supportive care.    LOS: 6 days    Abbee Cremeens 07/09/2020 Please see Amion

## 2020-07-09 NOTE — Progress Notes (Signed)
RT NOTE: Pt transported from 67M 13 to IR and back without any complications.

## 2020-07-09 NOTE — Progress Notes (Signed)
Pharmacy Antibiotic Note  Sarah Harmon is a 74 y.o. female admitted on 06/26/2020 with choledocholithiasis and acute cholecystitis. Who underwent ERCP on 7/18 but was unsuccessful due to distorted anatomy. On 7/19 patient planned to have lap cholecystectomy with intraoperative cholangiogram but during procedure patient developed abnormal bleeding and became hypotensive requiring ICU admission. Pharmacy has been consulted for Zosyn dosing. D#6 of abx therapy   She is currently on CRRT. WBC remains elevated at 13.7.   Plan:  Continue Zosyn 3.375 gm IV Q 6 hours while on CRRT    Height: 5\' 3"  (160 cm) Weight: 69.2 kg (152 lb 8.9 oz) IBW/kg (Calculated) : 52.4  Temp (24hrs), Avg:94.2 F (34.6 C), Min:91.6 F (33.1 C), Max:98 F (36.7 C)  Recent Labs  Lab 06/27/2020 2034 06/28/2020 2037 06/17/2020 0519 07/15/2020 1253 07/07/20 2059 07/08/20 0512 07/08/20 1600 07/08/20 1844 07/08/20 2342 07/09/20 0112 07/09/20 0452 07/09/20 0630  WBC   < >  --  6.8   < > 9.0 12.9*  --  15.7* 14.8*  --   --  13.7*  CREATININE   < >  --  6.30*   < > 3.05* 3.39* 3.00*  --   --  2.17* 1.97*  --   LATICACIDVEN  --  7.9* >11.0*  --   --   --   --   --   --   --   --   --    < > = values in this interval not displayed.    Estimated Creatinine Clearance: 23 mL/min (A) (by C-G formula based on SCr of 1.97 mg/dL (H)).    No Known Allergies  Antimicrobials this admission: 7/17 Zosyn >>    Cultures this admission: 7/18 bcx: negF   Thank you for allowing pharmacy to be a part of this patient's care.  Albertina Parr, PharmD., BCPS, BCCCP Clinical Pharmacist Clinical phone for 07/09/20 until 3:30pm: [phone] If after 3:30pm, please refer to [facility] for unit-specific pharmacist

## 2020-07-09 NOTE — Progress Notes (Signed)
[facility] Progress Note  Name: KAILEA DANNEMILLER MRN: [id number] DOB: 1946-02-13  Chief Complaint:  abd pain   Subjective:  Remains intubated and sedated, no pressors Tolerating CRRT well D/w primary bedside   Review of systems:  Unable to obtain 2/2 intubated   Intake/Output Summary (Last 24 hours) at 07/09/2020 0949 Last data filed at 07/09/2020 0917 Gross per 24 hour  Intake 2642.18 ml  Output 4820 ml  Net -2177.82 ml    Vitals:  Vitals:   07/09/20 0800 07/09/20 0825 07/09/20 0835 07/09/20 0900  BP: (!) 57/41     Pulse: 65 70  62  Resp: 20 22  21   Temp: (!) 96.3 F (35.7 C)   (!) 96.8 F (36 C)  TempSrc:      SpO2: 99% 99% 97% 98%  Weight:      Height:       A line MAPs are in the 80s - NIBP cuff inaccurate  Physical Exam:  General adult female in bed critically ill  HEENT normocephalic atraumatic  Lungs coarse, down to 50% FiO2 now Heart S1S2 no rub Abdomen drains in place, distended abd  Extremities no edema appreciated.  Neuro - not on sedation, withdraws to pain this AM, not following commands Access LUE AVF bruit and thrill; RIJ nontunneled catheter    Medications reviewed    Labs:  BMP Latest Ref Rng & Units 07/09/2020 07/09/2020 07/08/2020  Glucose 70 - 99 mg/dL 144(H) 144(H) 87  BUN 8 - 23 mg/dL 9 9 13   Creatinine 0.44 - 1.00 mg/dL 1.97(H) 2.17(H) 3.00(H)  Sodium 135 - 145 mmol/L 135 133(L) 137  Potassium 3.5 - 5.1 mmol/L 3.7 3.8 4.0  Chloride 98 - 111 mmol/L 97(L) 97(L) 96(L)  CO2 22 - 32 mmol/L 23 21(L) 22  Calcium 8.9 - 10.3 mg/dL 9.9 9.4 9.9     Assessment/Plan:   # Shock - acute blood loss and septic.  S/p embolization arterial bleed per IR on 7/21, stble. Coagulopathy per critical care.    #Hypoxic resp failure:  Improving, d/w PCCM -- ^ UF as she's moving towards PS trial hopefully later today.  # Choledocolithiasis with acute cholecystitis - abx per primary.  now s/p aborted lap chole complicated by intraoperative  hypotension and blood loss with coagulopathy. Going for repeat imaging today.  # ESRD - s/p HD Wed,  Note normally TTS as outpatient.    No heparin noting platelets.  Per above CRRT today cont with [facility].  On 4K fluids, continue; K 3.7 this AM  # Metabolic acidosis -  improved  # AMS/encephalopathy - intubated for airway protection. Not currently on sedation.   # Anemia - acute blood loss and ESRD.  S/p multiple PRBC's.  aranesp 100 mcg on 7/21 ordered and for weekly for now  # Metabolic bone disease continue VDRA for now   Justin Mend, MD 07/09/2020  9:49 AM

## 2020-07-09 NOTE — Progress Notes (Signed)
Nutrition Follow-up  DOCUMENTATION CODES:   Non-severe (moderate) malnutrition in context of chronic illness  INTERVENTION:   Titrate tube feeding to goal via OG tube: - Start with Vital High Protein @ 10 ml/hr and increase rate by 10 ml q 4 hours until goal rate of 50 ml/hr is reached (1200 ml/day)  Tube feeding regimen at goal provides 1200 kcal, 105 grams of protein, and 1003 ml of H2O (meets 100% of needs).  - B-complex with vitamin C  NUTRITION DIAGNOSIS:   Moderate Malnutrition related to chronic illness (ESRD, cirrhosis) as evidenced by moderate fat depletion, moderate muscle depletion, severe muscle depletion.  Ongoing  GOAL:   Provide needs based on ASPEN/SCCM guidelines  Progressing, will be met with TF at goal  MONITOR:   Vent status, Labs, Weight trends, TF tolerance, Skin, I & O's  REASON FOR ASSESSMENT:   Ventilator    ASSESSMENT:   74 year old female with past medical history of hypertension, hyperlipidemia, depression, anxiety, hepatocellular carcinoma (dx 02/2015 S/P thermoablation), ESRD on HD, alcoholic liver cirrhosis presents with severe abdominal pain. 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  7/17 - s/p MRCP 7/18 - ERCP unsuccessful 7/19 - s/p aborted laparoscopic cholecystectomy with placement of percutaneous cholecystostomy tube, surgery stopped early due to intraoperative hypotension and blood loss, s/p massive transfusion 7/20 - intubated, trickle TF initiated 7/21 - HD, s/p postmesenteric arteriogram and percutaneous coil embolization of the pancreaticoduodenal artery and GDA and particle embolization of the right hepatic artery 7/22 - s/p bronch for mucus plugging, CRRT initiated  Discussed pt with RN and during ICU rounds. Permission received to place orders to titrate  tube feeding up to goal rate.  Per Surgery note, plan is for IR study through cholecystostomy tube to see if cystic duct is patent and to determine the location of the obstructing CBD stone.  OG tube in place with TF infusing. Pt remains on CRRT.  Current TF: Vital High Protein @ 10 ml/hr  EDW: 52.4 kg (admit weight)  Patient is currently intubated on ventilator support MV: 8.5 L/min Temp (24hrs), Avg:94.8 F (34.9 C), Min:91.6 F (33.1 C), Max:98 F (36.7 C) BP (a-line): 154/48 MAP (a-line): 78   Drips: D10: 75 ml/hr (provides 612 kcal daily)  Medications reviewed and include: aranesp, colace, hectorol, protonix, thiamine, IV abx  Labs reviewed: hemoglobin 7.1 CBG's: 62-147 x 24 hours  Right lateral abdominal drain: 25 ml x 24 hours Right medial JP abdominal drain: 15 ml x 24 hours CRRT net UF: 4331 ml x 24 hours I/O's: +11.0 L since admit  Diet Order:   Diet Order            Diet NPO time specified  Diet effective now                 EDUCATION NEEDS:   Not appropriate for education at this time  Skin:  Skin Assessment: Skin Integrity Issues: Incisions: closed abdomen  Last BM:  07/08/20  Height:   Ht Readings from Last 1 Encounters:  07/08/20 '5\' 3"'$  (1.6 m)    Weight:   Wt Readings from Last 1 Encounters:  07/09/20 69.2 kg    Ideal Body Weight:  52.3 kg  BMI:  Body mass index is 27.02 kg/m.  Estimated Nutritional Needs:   Kcal:  1184  Protein:  105-120 grams  Fluid:  >/= 1.5 L  Gaynell Face, MS, RD, LDN Inpatient Clinical Dietitian Please see [facility] for contact information.

## 2020-07-09 NOTE — Progress Notes (Addendum)
[facility] Progress Note Patient Name: Sarah Harmon DOB: 18-Mar-1946 MRN: [id number]   Date of Service  07/09/2020  HPI/Events of Note  Persistent coagulopathy, INR 2.4  eICU Interventions  DIC protocol triggered, labs sent , transfuse 2 units of FFP , check fibrinogen and CBC .        Kerry Kass Sarah Harmon 07/09/2020, 1:02 AM

## 2020-07-10 ENCOUNTER — Inpatient Hospital Stay (HOSPITAL_COMMUNITY): Payer: Medicare Other

## 2020-07-10 LAB — BPAM FFP
Blood Product Expiration Date: 202107272359
Blood Product Expiration Date: 202107272359
Blood Product Expiration Date: 202107282359
Blood Product Expiration Date: 202107282359
ISSUE DATE / TIME: 202107230147
ISSUE DATE / TIME: 202107230147
ISSUE DATE / TIME: 202107231158
ISSUE DATE / TIME: 202107231158
Unit Type and Rh: 5100
Unit Type and Rh: 5100
Unit Type and Rh: 6200
Unit Type and Rh: 6200

## 2020-07-10 LAB — CBC
HCT: 24.7 % — ABNORMAL LOW (ref 36.0–46.0)
Hemoglobin: 7.8 g/dL — ABNORMAL LOW (ref 12.0–15.0)
MCH: 28.9 pg (ref 26.0–34.0)
MCHC: 31.6 g/dL (ref 30.0–36.0)
MCV: 91.5 fL (ref 80.0–100.0)
Platelets: 46 10*3/uL — ABNORMAL LOW (ref 150–400)
RBC: 2.7 MIL/uL — ABNORMAL LOW (ref 3.87–5.11)
RDW: 15.5 % (ref 11.5–15.5)
WBC: 11.6 10*3/uL — ABNORMAL HIGH (ref 4.0–10.5)
nRBC: 95 % — ABNORMAL HIGH (ref 0.0–0.2)

## 2020-07-10 LAB — COMPREHENSIVE METABOLIC PANEL
ALT: 937 U/L — ABNORMAL HIGH (ref 0–44)
AST: 1396 U/L — ABNORMAL HIGH (ref 15–41)
Albumin: 3.6 g/dL (ref 3.5–5.0)
Alkaline Phosphatase: 265 U/L — ABNORMAL HIGH (ref 38–126)
Anion gap: 16 — ABNORMAL HIGH (ref 5–15)
BUN: 7 mg/dL — ABNORMAL LOW (ref 8–23)
CO2: 20 mmol/L — ABNORMAL LOW (ref 22–32)
Calcium: 9.9 mg/dL (ref 8.9–10.3)
Chloride: 98 mmol/L (ref 98–111)
Creatinine, Ser: 1.01 mg/dL — ABNORMAL HIGH (ref 0.44–1.00)
GFR calc Af Amer: >60 mL/min (ref >60)
GFR calc non Af Amer: 54 mL/min — ABNORMAL LOW (ref >60)
Glucose, Bld: 141 mg/dL — ABNORMAL HIGH (ref 70–99)
Potassium: 3.7 mmol/L (ref 3.5–5.1)
Sodium: 134 mmol/L — ABNORMAL LOW (ref 135–145)
Total Bilirubin: 18.6 mg/dL (ref 0.3–1.2)
Total Protein: 7.9 g/dL (ref 6.5–8.1)

## 2020-07-10 LAB — CBC WITH DIFFERENTIAL/PLATELET
Abs Immature Granulocytes: 0.4 10*3/uL — ABNORMAL HIGH (ref 0.00–0.07)
Band Neutrophils: 7 %
Basophils Absolute: 0 10*3/uL (ref 0.0–0.1)
Basophils Relative: 0 %
Eosinophils Absolute: 0 10*3/uL (ref 0.0–0.5)
Eosinophils Relative: 0 %
HCT: 29.7 % — ABNORMAL LOW (ref 36.0–46.0)
Hemoglobin: 9.6 g/dL — ABNORMAL LOW (ref 12.0–15.0)
Lymphocytes Relative: 6 %
Lymphs Abs: 0.7 10*3/uL (ref 0.7–4.0)
MCH: 29.9 pg (ref 26.0–34.0)
MCHC: 32.3 g/dL (ref 30.0–36.0)
MCV: 92.5 fL (ref 80.0–100.0)
Monocytes Absolute: 0.7 10*3/uL (ref 0.1–1.0)
Monocytes Relative: 6 %
Myelocytes: 3 %
Neutro Abs: 10.3 10*3/uL — ABNORMAL HIGH (ref 1.7–7.7)
Neutrophils Relative %: 78 %
Platelets: 37 10*3/uL — ABNORMAL LOW (ref 150–400)
RBC: 3.21 MIL/uL — ABNORMAL LOW (ref 3.87–5.11)
RDW: 15.6 % — ABNORMAL HIGH (ref 11.5–15.5)
WBC: 9.1 10*3/uL (ref 4.0–10.5)
nRBC: 165 /100 WBC — ABNORMAL HIGH

## 2020-07-10 LAB — GLUCOSE, CAPILLARY
Glucose-Capillary: 140 mg/dL — ABNORMAL HIGH (ref 70–99)
Glucose-Capillary: 108 mg/dL — ABNORMAL HIGH (ref 70–99)
Glucose-Capillary: 117 mg/dL — ABNORMAL HIGH (ref 70–99)
Glucose-Capillary: 118 mg/dL — ABNORMAL HIGH (ref 70–99)
Glucose-Capillary: 124 mg/dL — ABNORMAL HIGH (ref 70–99)
Glucose-Capillary: 115 mg/dL — ABNORMAL HIGH (ref 70–99)
Glucose-Capillary: 127 mg/dL — ABNORMAL HIGH (ref 70–99)
Glucose-Capillary: 121 mg/dL — ABNORMAL HIGH (ref 70–99)

## 2020-07-10 LAB — RENAL FUNCTION PANEL
Albumin: 3.6 g/dL (ref 3.5–5.0)
Anion gap: 12 (ref 5–15)
BUN: 7 mg/dL — ABNORMAL LOW (ref 8–23)
CO2: 24 mmol/L (ref 22–32)
Calcium: 9.5 mg/dL (ref 8.9–10.3)
Chloride: 98 mmol/L (ref 98–111)
Creatinine, Ser: 1.23 mg/dL — ABNORMAL HIGH (ref 0.44–1.00)
GFR calc Af Amer: 50 mL/min — ABNORMAL LOW (ref >60)
GFR calc non Af Amer: 43 mL/min — ABNORMAL LOW (ref >60)
Glucose, Bld: 120 mg/dL — ABNORMAL HIGH (ref 70–99)
Phosphorus: 2.2 mg/dL — ABNORMAL LOW (ref 2.5–4.6)
Potassium: 3.7 mmol/L (ref 3.5–5.1)
Sodium: 134 mmol/L — ABNORMAL LOW (ref 135–145)

## 2020-07-10 LAB — PREPARE FRESH FROZEN PLASMA
Unit division: 0
Unit division: 0
Unit division: 0
Unit division: 0

## 2020-07-10 LAB — PROTIME-INR
INR: 1.9 — ABNORMAL HIGH (ref 0.8–1.2)
Prothrombin Time: 21.4 seconds — ABNORMAL HIGH (ref 11.4–15.2)

## 2020-07-10 LAB — MAGNESIUM: Magnesium: 2.3 mg/dL (ref 1.7–2.4)

## 2020-07-10 LAB — PHOSPHORUS: Phosphorus: 2.3 mg/dL — ABNORMAL LOW (ref 2.5–4.6)

## 2020-07-10 LAB — LIPASE, BLOOD: Lipase: 134 U/L — ABNORMAL HIGH (ref 11–51)

## 2020-07-10 MED ORDER — "THROMBI-PAD 3""X3"" EX PADS"
1.0000 | MEDICATED_PAD | Freq: Once | CUTANEOUS | Status: AC
  Administered 2020-07-10: 1 via TOPICAL
  Filled 2020-07-10: qty 1

## 2020-07-10 MED ORDER — DOXERCALCIFEROL 4 MCG/2ML IV SOLN
7.0000 ug | INTRAVENOUS | Status: DC
  Filled 2020-07-10: qty 4

## 2020-07-10 MED ORDER — SODIUM CHLORIDE 0.9% IV SOLUTION: Freq: Once | INTRAVENOUS | Status: AC

## 2020-07-10 MED ORDER — NOREPINEPHRINE 16 MG/250ML-% IV SOLN
0.0000 ug/min | INTRAVENOUS | Status: DC
  Administered 2020-07-10: 2 ug/min via INTRAVENOUS
  Administered 2020-07-12: 6 ug/min via INTRAVENOUS
  Administered 2020-07-13 (×2): 60 ug/min via INTRAVENOUS
  Administered 2020-07-13: 36 ug/min via INTRAVENOUS
  Filled 2020-07-10 (×5): qty 250

## 2020-07-10 MED ORDER — NOREPINEPHRINE 4 MG/250ML-% IV SOLN: 0.0000 ug/min | INTRAVENOUS | Status: DC

## 2020-07-10 NOTE — Progress Notes (Signed)
[facility] Progress Note Patient Name: Sarah Harmon DOB: 1946/11/28 MRN: [id number]   Date of Service  07/10/2020  HPI/Events of Note  Patient had 2 EKG's done, the better quality EKG shows non specific ST T changes. ACS unlikely.  eICU Interventions  No intervention.        Kerry Kass Dystany Duffy 07/10/2020, 1:45 AM

## 2020-07-10 NOTE — Progress Notes (Signed)
Monitor alarming "STE." EKG strip very poor quality with artifact d/t ?muscle tremor. Attempted to get EKG quality better with no success. 12 lead EKG done. 1st EKG-"Accelerated Junctional rhythm, ST elevation-Acute MI/STEMI." 2nd EKG-"Normal Sinus Rhythm, nonspecific ST and T wave abnormality." Both EKGs with very poor quality d/t artifact. At this time, pt unable to answer whether she is having chest pain. Vital signs currently stable-HR-63, BP-126/35, RR-20, SpO2-100% on .50 FiO2 on vent. Dr. Lucile Shutters notified via [facility] RN-awaiting return call. Will continue to monitor pt.

## 2020-07-10 NOTE — Progress Notes (Signed)
[facility] Progress Note  Name: Sarah Harmon MRN: [id number] DOB: [birth year]  Chief Complaint:  abd pain   Subjective:  Remains intubated and sedated, no pressors Tolerating CRRT well  --> UF 7L yesterday in effort to help wean vent support CBD stone identified on cholangiogram yesterday    Review of systems:  Unable to obtain 2/2 intubated   Intake/Output Summary (Last 24 hours) at 07/10/2020 1511 Last data filed at 07/10/2020 1500 Gross per 24 hour  Intake 3207.72 ml  Output 8611 ml  Net -5403.28 ml    Vitals:  Vitals:   07/10/20 1415 07/10/20 1430 07/10/20 1445 07/10/20 1500  BP:      Pulse: 61 59 65 62  Resp: 20 20 20 20   Temp: (!) 93.7 F (34.3 C) (!) 93.9 F (34.4 C) (!) 93.7 F (34.3 C) (!) 93.6 F (34.2 C)  TempSrc:      SpO2: 100% 100% 100% 100%  Weight:      Height:       A line MAPs are in the 80s - NIBP cuff inaccurate  Physical Exam:  General adult female in bed critically ill  HEENT normocephalic atraumatic  Lungs coarse, down to 40% FiO2 now Heart S1S2 no rub Abdomen drains in place, distended abd  Extremities no edema appreciated.  Neuro - follows some commands now Access LUE AVF bruit and thrill; RIJ nontunneled catheter    Medications reviewed    Labs:  BMP Latest Ref Rng & Units 07/10/2020 07/09/2020 07/09/2020  Glucose 70 - 99 mg/dL 120(H) 132(H) -  BUN 8 - 23 mg/dL 7(L) 9 -  Creatinine 0.44 - 1.00 mg/dL 1.23(H) 1.75(H) -  Sodium 135 - 145 mmol/L 134(L) 135 137  Potassium 3.5 - 5.1 mmol/L 3.7 3.8 3.9  Chloride 98 - 111 mmol/L 98 98 -  CO2 22 - 32 mmol/L 24 22 -  Calcium 8.9 - 10.3 mg/dL 9.5 10.0 -     Assessment/Plan:   # Shock - acute blood loss and septic.  S/p embolization arterial bleed per IR on 7/21, stble. Coagulopathy per critical care.    #Hypoxic resp failure:  Improving, d/w PCCM -- ^d UF considerably yesterday to help with any pulmonary edema and facilitate vent weaning.  After UF 7L yesterday,  back down on UF a bit today but still aim for net negative status.   # Choledocolithiasis with acute cholecystitis - abx per primary.  now s/p aborted lap chole complicated by intraoperative hypotension and blood loss with coagulopathy. CBD stone to be intervened upon by IR per notes.   # ESRD - s/p HD Wed,  Note normally TTS as outpatient.    No heparin noting platelets.  Per above CRRT today cont with dec in [facility] but still net neg.  On 4K fluids, continue; K 3.7 this AM.  She's hemodynamically stable and at some point could consider flipping to iHD.    # Metabolic acidosis -  improved  # AMS/encephalopathy - intubated for airway protection. Not currently on sedation.   # Anemia - acute blood loss and ESRD.  S/p multiple PRBC's.  aranesp 100 mcg on 7/21 ordered and for weekly for now  # Metabolic bone disease continue VDRA for now   Justin Mend, MD 07/10/2020  3:11 PM

## 2020-07-10 NOTE — Progress Notes (Signed)
5 Days Post-Op   Subjective/Chief Complaint: Remains on vent, off pressors   Objective: Vital signs in last 24 hours: Temp:  [95 F (35 C)-97.2 F (36.2 C)] 96.4 F (35.8 C) (07/24 0800) Pulse Rate:  [47-72] 70 (07/24 0800) Resp:  [18-27] 26 (07/24 0800) BP: (40-177)/(27-78) 177/46 (07/24 0426) SpO2:  [93 %-100 %] 99 % (07/24 0800) Arterial Line BP: (109-199)/(39-53) 166/43 (07/24 0800) FiO2 (%):  [40 %-60 %] 40 % (07/24 0800) Weight:  [71 kg] 71 kg (07/24 0426) Last BM Date: 07/08/20  Intake/Output from previous day: 07/23 0701 - 07/24 0700 In: 3488 [I.V.:2131.9; Blood:322; NG/GT:784; IV Piggyback:250.1] Out: 1610 [Drains:415] Intake/Output this shift: Total I/O In: 125 [I.V.:75; NG/GT:50] Out: -   Exam: Intubated, follows some commands Abdomen distended mildly, drain bloody  Lab Results:  Recent Labs    07/09/20 0630 07/09/20 0630 07/09/20 1229 07/10/20 0513  WBC 13.7*  --   --  11.6*  HGB 7.1*   < > 7.5* 7.8*  HCT 22.4*   < > 22.0* 24.7*  PLT 66*  --   --  46*   < > = values in this interval not displayed.   BMET Recent Labs    07/09/20 1600 07/10/20 0513  NA 135 134*  K 3.8 3.7  CL 98 98  CO2 22 24  GLUCOSE 132* 120*  BUN 9 7*  CREATININE 1.75* 1.23*  CALCIUM 10.0 9.5   PT/INR Recent Labs    07/09/20 0058 07/10/20 0513  LABPROT 26.5* 21.4*  INR 2.5* 1.9*   ABG Recent Labs    07/09/20 1229  PHART 7.439  HCO3 24.4    Studies/Results: IR Angiogram Visceral Selective  Result Date: 07/08/2020 INDICATION: History of cirrhosis and end-stage renal disease, currently on dialysis now with attempted though non successful cholecystectomy with postoperative bleeding found to have active extravasation about the gallbladder fossa. Please perform mesenteric arteriogram and percutaneous embolization as indicated. EXAM: 1. ULTRASOUND GUIDANCE FOR ARTERIAL ACCESS 2. SELECTIVE CELIAC ARTERIOGRAM 3. SELECTIVE PANCREATICODUODENAL ARTERIOGRAM AND  PERCUTANEOUS COIL EMBOLIZATION 4. SELECTIVE GASTRODUODENAL ARTERIOGRAM AND PERCUTANEOUS COIL EMBOLIZATION 5. SELECTIVE PROPER HEPATIC ARTERIOGRAM 6. SELECTIVE ARTERIOGRAM OF ANTERIOR DIVISION OF THE RIGHT HEPATIC ARTERY AND PERCUTANEOUS PARTICLE EMBOLIZATION 7. SELECTIVE ARTERIOGRAM OF THE RIGHT HEPATIC ARTERY AND PERCUTANEOUS PARTICLE EMBOLIZATION COMPARISON:  CT abdomen and pelvis-earlier same day MEDICATIONS: None ANESTHESIA/SEDATION: Moderate (conscious) sedation was employed during this procedure. A total of Versed 4 mg and Fentanyl 200 mcg was administered intravenously. Moderate Sedation Time: 72 minutes. The patient's level of consciousness and vital signs were monitored continuously by radiology nursing throughout the procedure under my direct supervision. CONTRAST:  75 cc Omnipaque 300 FLUOROSCOPY TIME:  20 minutes, 30 seconds (9604 mGy) COMPLICATIONS: None immediate. PROCEDURE: Informed consent was obtained from the patient's daughter following explanation of the procedure, risks, benefits and alternatives. All questions were addressed. A time out was performed prior to the initiation of the procedure. Maximal barrier sterile technique utilized including caps, mask, sterile gowns, sterile gloves, large sterile drape, hand hygiene, and Betadine prep. The right femoral head was marked fluoroscopically. Under sterile conditions and local anesthesia, the right common femoral artery access was performed with a micropuncture needle. Under direct ultrasound guidance, the right common femoral was accessed with a micropuncture kit. An ultrasound image was saved for documentation purposes. This allowed for placement of a 5-French vascular sheath. A limited arteriogram was performed through the side arm of the sheath confirming appropriate access within the right common femoral artery. Over  a Bentson wire, a Mickelson catheter was advanced the caudal aspect of the thoracic aorta where was reformed, back bled and  flushed. The Mickelson catheter was then utilized to select the celiac artery and a selective celiac arteriogram was performed. Next, with the use of a fathom 14 microwire, a STC microcatheter was advanced to the level of the gastroduodenal artery and a gastroduodenal arteriogram was performed. The microcatheter was then utilized to select the pancreaticoduodenal artery and a selective pancreaticoduodenal arteriogram performed. The peripheral aspect of the pancreaticoduodenal artery was then percutaneously coil embolized with multiple overlapping 3 mm and 4 mm interlock coils. Post embolization arteriogram was performed from level of the GDA. The microcatheter was then retracted to the level of the gastroduodenal artery and the gastroduodenal artery was embolized to near its origin with multiple overlapping 5 mm and 6 mm interlock coils. The Microcatheter was drawn to the level of the common hepatic artery and a selective common hepatic arteriogram was performed. The microcatheter was removed and a celiac arteriogram was performed through the [facility] catheter. Next, with the use of a fathom 14 microwire, a high-flow microcatheter was advanced to the level of the proper hepatic artery and a proper hepatic arteriogram was performed. The microcatheter was utilized to select the anterior division of the right hepatic artery and a selective arteriogram was performed. Next, the anterior division of the right hepatic artery was percutaneously embolized with approximately 4/5 of one vial of 500-700 Embospheres. Post embolization arteriogram was performed. Next, the remainder of the right hepatic artery was particle embolized with the remaining 1 and 1/5 vials of 500-700 Embospheres. The microcatheter was retracted to the level of the common hepatic artery and completion arteriograms were performed. Images were reviewed and the procedure was terminated. All wires and catheters were removed from the patient. Given patient's  elevated INR (greater than 4), the vascular sheath was secured at the right groin access site within interrupted suture and connected to a pressure bag. The patient tolerated the procedure well without immediate post procedural complication. FINDINGS: Selective celiac arteriogram demonstrates conventional anatomy. Note is again made of parasitized vascularity from multiple divisions of the right hepatic artery as well as from a hypertrophy pancreaticoduodenal artery as was suggested on preceding CTA. Additionally, there is early AV shunting about right lobe of the liver likely secondary to patient's background of cirrhosis. Previously identified area of active arterial extravasation demonstrated on preceding abdominal CT is not definitely angiographically. Selective pancreaticoduodenal arteriogram demonstrates apparent parasitized vascularity to the gallbladder fossa, again with abnormal AV shunting. Following percutaneous coil embolization of both the pancreaticoduodenal and gastroduodenal arteries, there is complete stasis of flow through this vascular distribution. Selective arteriograms of the anterior division of the right hepatic artery as well as the main right hepatic artery demonstrates abnormal vascularity and parasitized flow about the gallbladder fossa. Following percutaneous particle embolization, there is marked reduction in flow and pruning of the peripheral vasculature throughout the right hepatic arterial vascular distribution. IMPRESSION: 1. Technically successful percutaneous coil embolization of the pancreaticoduodenal and gastroduodenal arteries felt to contribute to paired to rise parasitized abnormal flow to the gallbladder fossa. 2. Technically successful prophylactic percutaneous particle embolization of the anterior division of the right hepatic artery as well as the right hepatic artery due to abnormal vascularity about the gallbladder fossa. PLAN: - Continue resuscitative measures as per  the ICU staff. - The patient is to remain flat while vascular sheath remains in place. - Plan to remove vascular sheath once  INR below 1.5 (ideally). Electronically Signed   By: Sandi Mariscal M.D.   On: 07/08/2020 09:55   IR Angiogram Selective Each Additional Vessel  Result Date: 07/08/2020 INDICATION: History of cirrhosis and end-stage renal disease, currently on dialysis now with attempted though non successful cholecystectomy with postoperative bleeding found to have active extravasation about the gallbladder fossa. Please perform mesenteric arteriogram and percutaneous embolization as indicated. EXAM: 1. ULTRASOUND GUIDANCE FOR ARTERIAL ACCESS 2. SELECTIVE CELIAC ARTERIOGRAM 3. SELECTIVE PANCREATICODUODENAL ARTERIOGRAM AND PERCUTANEOUS COIL EMBOLIZATION 4. SELECTIVE GASTRODUODENAL ARTERIOGRAM AND PERCUTANEOUS COIL EMBOLIZATION 5. SELECTIVE PROPER HEPATIC ARTERIOGRAM 6. SELECTIVE ARTERIOGRAM OF ANTERIOR DIVISION OF THE RIGHT HEPATIC ARTERY AND PERCUTANEOUS PARTICLE EMBOLIZATION 7. SELECTIVE ARTERIOGRAM OF THE RIGHT HEPATIC ARTERY AND PERCUTANEOUS PARTICLE EMBOLIZATION COMPARISON:  CT abdomen and pelvis-earlier same day MEDICATIONS: None ANESTHESIA/SEDATION: Moderate (conscious) sedation was employed during this procedure. A total of Versed 4 mg and Fentanyl 200 mcg was administered intravenously. Moderate Sedation Time: 72 minutes. The patient's level of consciousness and vital signs were monitored continuously by radiology nursing throughout the procedure under my direct supervision. CONTRAST:  75 cc Omnipaque 300 FLUOROSCOPY TIME:  20 minutes, 30 seconds (1610 mGy) COMPLICATIONS: None immediate. PROCEDURE: Informed consent was obtained from the patient's daughter following explanation of the procedure, risks, benefits and alternatives. All questions were addressed. A time out was performed prior to the initiation of the procedure. Maximal barrier sterile technique utilized including caps, mask, sterile  gowns, sterile gloves, large sterile drape, hand hygiene, and Betadine prep. The right femoral head was marked fluoroscopically. Under sterile conditions and local anesthesia, the right common femoral artery access was performed with a micropuncture needle. Under direct ultrasound guidance, the right common femoral was accessed with a micropuncture kit. An ultrasound image was saved for documentation purposes. This allowed for placement of a 5-French vascular sheath. A limited arteriogram was performed through the side arm of the sheath confirming appropriate access within the right common femoral artery. Over a Bentson wire, a Mickelson catheter was advanced the caudal aspect of the thoracic aorta where was reformed, back bled and flushed. The Mickelson catheter was then utilized to select the celiac artery and a selective celiac arteriogram was performed. Next, with the use of a fathom 14 microwire, a STC microcatheter was advanced to the level of the gastroduodenal artery and a gastroduodenal arteriogram was performed. The microcatheter was then utilized to select the pancreaticoduodenal artery and a selective pancreaticoduodenal arteriogram performed. The peripheral aspect of the pancreaticoduodenal artery was then percutaneously coil embolized with multiple overlapping 3 mm and 4 mm interlock coils. Post embolization arteriogram was performed from level of the GDA. The microcatheter was then retracted to the level of the gastroduodenal artery and the gastroduodenal artery was embolized to near its origin with multiple overlapping 5 mm and 6 mm interlock coils. The Microcatheter was drawn to the level of the common hepatic artery and a selective common hepatic arteriogram was performed. The microcatheter was removed and a celiac arteriogram was performed through the [facility] catheter. Next, with the use of a fathom 14 microwire, a high-flow microcatheter was advanced to the level of the proper hepatic artery  and a proper hepatic arteriogram was performed. The microcatheter was utilized to select the anterior division of the right hepatic artery and a selective arteriogram was performed. Next, the anterior division of the right hepatic artery was percutaneously embolized with approximately 4/5 of one vial of 500-700 Embospheres. Post embolization arteriogram was performed. Next, the remainder of the right  hepatic artery was particle embolized with the remaining 1 and 1/5 vials of 500-700 Embospheres. The microcatheter was retracted to the level of the common hepatic artery and completion arteriograms were performed. Images were reviewed and the procedure was terminated. All wires and catheters were removed from the patient. Given patient's elevated INR (greater than 4), the vascular sheath was secured at the right groin access site within interrupted suture and connected to a pressure bag. The patient tolerated the procedure well without immediate post procedural complication. FINDINGS: Selective celiac arteriogram demonstrates conventional anatomy. Note is again made of parasitized vascularity from multiple divisions of the right hepatic artery as well as from a hypertrophy pancreaticoduodenal artery as was suggested on preceding CTA. Additionally, there is early AV shunting about right lobe of the liver likely secondary to patient's background of cirrhosis. Previously identified area of active arterial extravasation demonstrated on preceding abdominal CT is not definitely angiographically. Selective pancreaticoduodenal arteriogram demonstrates apparent parasitized vascularity to the gallbladder fossa, again with abnormal AV shunting. Following percutaneous coil embolization of both the pancreaticoduodenal and gastroduodenal arteries, there is complete stasis of flow through this vascular distribution. Selective arteriograms of the anterior division of the right hepatic artery as well as the main right hepatic artery  demonstrates abnormal vascularity and parasitized flow about the gallbladder fossa. Following percutaneous particle embolization, there is marked reduction in flow and pruning of the peripheral vasculature throughout the right hepatic arterial vascular distribution. IMPRESSION: 1. Technically successful percutaneous coil embolization of the pancreaticoduodenal and gastroduodenal arteries felt to contribute to paired to rise parasitized abnormal flow to the gallbladder fossa. 2. Technically successful prophylactic percutaneous particle embolization of the anterior division of the right hepatic artery as well as the right hepatic artery due to abnormal vascularity about the gallbladder fossa. PLAN: - Continue resuscitative measures as per the ICU staff. - The patient is to remain flat while vascular sheath remains in place. - Plan to remove vascular sheath once INR below 1.5 (ideally). Electronically Signed   By: Simonne Come M.D.   On: 07/08/2020 09:55   IR Angiogram Selective Each Additional Vessel  Result Date: 07/08/2020 INDICATION: History of cirrhosis and end-stage renal disease, currently on dialysis now with attempted though non successful cholecystectomy with postoperative bleeding found to have active extravasation about the gallbladder fossa. Please perform mesenteric arteriogram and percutaneous embolization as indicated. EXAM: 1. ULTRASOUND GUIDANCE FOR ARTERIAL ACCESS 2. SELECTIVE CELIAC ARTERIOGRAM 3. SELECTIVE PANCREATICODUODENAL ARTERIOGRAM AND PERCUTANEOUS COIL EMBOLIZATION 4. SELECTIVE GASTRODUODENAL ARTERIOGRAM AND PERCUTANEOUS COIL EMBOLIZATION 5. SELECTIVE PROPER HEPATIC ARTERIOGRAM 6. SELECTIVE ARTERIOGRAM OF ANTERIOR DIVISION OF THE RIGHT HEPATIC ARTERY AND PERCUTANEOUS PARTICLE EMBOLIZATION 7. SELECTIVE ARTERIOGRAM OF THE RIGHT HEPATIC ARTERY AND PERCUTANEOUS PARTICLE EMBOLIZATION COMPARISON:  CT abdomen and pelvis-earlier same day MEDICATIONS: None ANESTHESIA/SEDATION: Moderate (conscious)  sedation was employed during this procedure. A total of Versed 4 mg and Fentanyl 200 mcg was administered intravenously. Moderate Sedation Time: 72 minutes. The patient's level of consciousness and vital signs were monitored continuously by radiology nursing throughout the procedure under my direct supervision. CONTRAST:  75 cc Omnipaque 300 FLUOROSCOPY TIME:  20 minutes, 30 seconds (1495 mGy) COMPLICATIONS: None immediate. PROCEDURE: Informed consent was obtained from the patient's daughter following explanation of the procedure, risks, benefits and alternatives. All questions were addressed. A time out was performed prior to the initiation of the procedure. Maximal barrier sterile technique utilized including caps, mask, sterile gowns, sterile gloves, large sterile drape, hand hygiene, and Betadine prep. The right femoral head was  marked fluoroscopically. Under sterile conditions and local anesthesia, the right common femoral artery access was performed with a micropuncture needle. Under direct ultrasound guidance, the right common femoral was accessed with a micropuncture kit. An ultrasound image was saved for documentation purposes. This allowed for placement of a 5-French vascular sheath. A limited arteriogram was performed through the side arm of the sheath confirming appropriate access within the right common femoral artery. Over a Bentson wire, a Mickelson catheter was advanced the caudal aspect of the thoracic aorta where was reformed, back bled and flushed. The Mickelson catheter was then utilized to select the celiac artery and a selective celiac arteriogram was performed. Next, with the use of a fathom 14 microwire, a STC microcatheter was advanced to the level of the gastroduodenal artery and a gastroduodenal arteriogram was performed. The microcatheter was then utilized to select the pancreaticoduodenal artery and a selective pancreaticoduodenal arteriogram performed. The peripheral aspect of the  pancreaticoduodenal artery was then percutaneously coil embolized with multiple overlapping 3 mm and 4 mm interlock coils. Post embolization arteriogram was performed from level of the GDA. The microcatheter was then retracted to the level of the gastroduodenal artery and the gastroduodenal artery was embolized to near its origin with multiple overlapping 5 mm and 6 mm interlock coils. The Microcatheter was drawn to the level of the common hepatic artery and a selective common hepatic arteriogram was performed. The microcatheter was removed and a celiac arteriogram was performed through the [facility] catheter. Next, with the use of a fathom 14 microwire, a high-flow microcatheter was advanced to the level of the proper hepatic artery and a proper hepatic arteriogram was performed. The microcatheter was utilized to select the anterior division of the right hepatic artery and a selective arteriogram was performed. Next, the anterior division of the right hepatic artery was percutaneously embolized with approximately 4/5 of one vial of 500-700 Embospheres. Post embolization arteriogram was performed. Next, the remainder of the right hepatic artery was particle embolized with the remaining 1 and 1/5 vials of 500-700 Embospheres. The microcatheter was retracted to the level of the common hepatic artery and completion arteriograms were performed. Images were reviewed and the procedure was terminated. All wires and catheters were removed from the patient. Given patient's elevated INR (greater than 4), the vascular sheath was secured at the right groin access site within interrupted suture and connected to a pressure bag. The patient tolerated the procedure well without immediate post procedural complication. FINDINGS: Selective celiac arteriogram demonstrates conventional anatomy. Note is again made of parasitized vascularity from multiple divisions of the right hepatic artery as well as from a hypertrophy  pancreaticoduodenal artery as was suggested on preceding CTA. Additionally, there is early AV shunting about right lobe of the liver likely secondary to patient's background of cirrhosis. Previously identified area of active arterial extravasation demonstrated on preceding abdominal CT is not definitely angiographically. Selective pancreaticoduodenal arteriogram demonstrates apparent parasitized vascularity to the gallbladder fossa, again with abnormal AV shunting. Following percutaneous coil embolization of both the pancreaticoduodenal and gastroduodenal arteries, there is complete stasis of flow through this vascular distribution. Selective arteriograms of the anterior division of the right hepatic artery as well as the main right hepatic artery demonstrates abnormal vascularity and parasitized flow about the gallbladder fossa. Following percutaneous particle embolization, there is marked reduction in flow and pruning of the peripheral vasculature throughout the right hepatic arterial vascular distribution. IMPRESSION: 1. Technically successful percutaneous coil embolization of the pancreaticoduodenal and gastroduodenal arteries felt to contribute  to paired to rise parasitized abnormal flow to the gallbladder fossa. 2. Technically successful prophylactic percutaneous particle embolization of the anterior division of the right hepatic artery as well as the right hepatic artery due to abnormal vascularity about the gallbladder fossa. PLAN: - Continue resuscitative measures as per the ICU staff. - The patient is to remain flat while vascular sheath remains in place. - Plan to remove vascular sheath once INR below 1.5 (ideally). Electronically Signed   By: Sandi Mariscal M.D.   On: 07/08/2020 09:55   IR Angiogram Selective Each Additional Vessel  Result Date: 07/08/2020 INDICATION: History of cirrhosis and end-stage renal disease, currently on dialysis now with attempted though non successful cholecystectomy with  postoperative bleeding found to have active extravasation about the gallbladder fossa. Please perform mesenteric arteriogram and percutaneous embolization as indicated. EXAM: 1. ULTRASOUND GUIDANCE FOR ARTERIAL ACCESS 2. SELECTIVE CELIAC ARTERIOGRAM 3. SELECTIVE PANCREATICODUODENAL ARTERIOGRAM AND PERCUTANEOUS COIL EMBOLIZATION 4. SELECTIVE GASTRODUODENAL ARTERIOGRAM AND PERCUTANEOUS COIL EMBOLIZATION 5. SELECTIVE PROPER HEPATIC ARTERIOGRAM 6. SELECTIVE ARTERIOGRAM OF ANTERIOR DIVISION OF THE RIGHT HEPATIC ARTERY AND PERCUTANEOUS PARTICLE EMBOLIZATION 7. SELECTIVE ARTERIOGRAM OF THE RIGHT HEPATIC ARTERY AND PERCUTANEOUS PARTICLE EMBOLIZATION COMPARISON:  CT abdomen and pelvis-earlier same day MEDICATIONS: None ANESTHESIA/SEDATION: Moderate (conscious) sedation was employed during this procedure. A total of Versed 4 mg and Fentanyl 200 mcg was administered intravenously. Moderate Sedation Time: 72 minutes. The patient's level of consciousness and vital signs were monitored continuously by radiology nursing throughout the procedure under my direct supervision. CONTRAST:  75 cc Omnipaque 300 FLUOROSCOPY TIME:  20 minutes, 30 seconds (5732 mGy) COMPLICATIONS: None immediate. PROCEDURE: Informed consent was obtained from the patient's daughter following explanation of the procedure, risks, benefits and alternatives. All questions were addressed. A time out was performed prior to the initiation of the procedure. Maximal barrier sterile technique utilized including caps, mask, sterile gowns, sterile gloves, large sterile drape, hand hygiene, and Betadine prep. The right femoral head was marked fluoroscopically. Under sterile conditions and local anesthesia, the right common femoral artery access was performed with a micropuncture needle. Under direct ultrasound guidance, the right common femoral was accessed with a micropuncture kit. An ultrasound image was saved for documentation purposes. This allowed for placement of a  5-French vascular sheath. A limited arteriogram was performed through the side arm of the sheath confirming appropriate access within the right common femoral artery. Over a Bentson wire, a Mickelson catheter was advanced the caudal aspect of the thoracic aorta where was reformed, back bled and flushed. The Mickelson catheter was then utilized to select the celiac artery and a selective celiac arteriogram was performed. Next, with the use of a fathom 14 microwire, a STC microcatheter was advanced to the level of the gastroduodenal artery and a gastroduodenal arteriogram was performed. The microcatheter was then utilized to select the pancreaticoduodenal artery and a selective pancreaticoduodenal arteriogram performed. The peripheral aspect of the pancreaticoduodenal artery was then percutaneously coil embolized with multiple overlapping 3 mm and 4 mm interlock coils. Post embolization arteriogram was performed from level of the GDA. The microcatheter was then retracted to the level of the gastroduodenal artery and the gastroduodenal artery was embolized to near its origin with multiple overlapping 5 mm and 6 mm interlock coils. The Microcatheter was drawn to the level of the common hepatic artery and a selective common hepatic arteriogram was performed. The microcatheter was removed and a celiac arteriogram was performed through the [facility] catheter. Next, with the use of a fathom 14 microwire,  a high-flow microcatheter was advanced to the level of the proper hepatic artery and a proper hepatic arteriogram was performed. The microcatheter was utilized to select the anterior division of the right hepatic artery and a selective arteriogram was performed. Next, the anterior division of the right hepatic artery was percutaneously embolized with approximately 4/5 of one vial of 500-700 Embospheres. Post embolization arteriogram was performed. Next, the remainder of the right hepatic artery was particle embolized with  the remaining 1 and 1/5 vials of 500-700 Embospheres. The microcatheter was retracted to the level of the common hepatic artery and completion arteriograms were performed. Images were reviewed and the procedure was terminated. All wires and catheters were removed from the patient. Given patient's elevated INR (greater than 4), the vascular sheath was secured at the right groin access site within interrupted suture and connected to a pressure bag. The patient tolerated the procedure well without immediate post procedural complication. FINDINGS: Selective celiac arteriogram demonstrates conventional anatomy. Note is again made of parasitized vascularity from multiple divisions of the right hepatic artery as well as from a hypertrophy pancreaticoduodenal artery as was suggested on preceding CTA. Additionally, there is early AV shunting about right lobe of the liver likely secondary to patient's background of cirrhosis. Previously identified area of active arterial extravasation demonstrated on preceding abdominal CT is not definitely angiographically. Selective pancreaticoduodenal arteriogram demonstrates apparent parasitized vascularity to the gallbladder fossa, again with abnormal AV shunting. Following percutaneous coil embolization of both the pancreaticoduodenal and gastroduodenal arteries, there is complete stasis of flow through this vascular distribution. Selective arteriograms of the anterior division of the right hepatic artery as well as the main right hepatic artery demonstrates abnormal vascularity and parasitized flow about the gallbladder fossa. Following percutaneous particle embolization, there is marked reduction in flow and pruning of the peripheral vasculature throughout the right hepatic arterial vascular distribution. IMPRESSION: 1. Technically successful percutaneous coil embolization of the pancreaticoduodenal and gastroduodenal arteries felt to contribute to paired to rise parasitized abnormal  flow to the gallbladder fossa. 2. Technically successful prophylactic percutaneous particle embolization of the anterior division of the right hepatic artery as well as the right hepatic artery due to abnormal vascularity about the gallbladder fossa. PLAN: - Continue resuscitative measures as per the ICU staff. - The patient is to remain flat while vascular sheath remains in place. - Plan to remove vascular sheath once INR below 1.5 (ideally). Electronically Signed   By: Simonne Come M.D.   On: 07/08/2020 09:55   IR US Guide Vasc Access Right  Result Date: 07/08/2020 INDICATION: History of cirrhosis and end-stage renal disease, currently on dialysis now with attempted though non successful cholecystectomy with postoperative bleeding found to have active extravasation about the gallbladder fossa. Please perform mesenteric arteriogram and percutaneous embolization as indicated. EXAM: 1. ULTRASOUND GUIDANCE FOR ARTERIAL ACCESS 2. SELECTIVE CELIAC ARTERIOGRAM 3. SELECTIVE PANCREATICODUODENAL ARTERIOGRAM AND PERCUTANEOUS COIL EMBOLIZATION 4. SELECTIVE GASTRODUODENAL ARTERIOGRAM AND PERCUTANEOUS COIL EMBOLIZATION 5. SELECTIVE PROPER HEPATIC ARTERIOGRAM 6. SELECTIVE ARTERIOGRAM OF ANTERIOR DIVISION OF THE RIGHT HEPATIC ARTERY AND PERCUTANEOUS PARTICLE EMBOLIZATION 7. SELECTIVE ARTERIOGRAM OF THE RIGHT HEPATIC ARTERY AND PERCUTANEOUS PARTICLE EMBOLIZATION COMPARISON:  CT abdomen and pelvis-earlier same day MEDICATIONS: None ANESTHESIA/SEDATION: Moderate (conscious) sedation was employed during this procedure. A total of Versed 4 mg and Fentanyl 200 mcg was administered intravenously. Moderate Sedation Time: 72 minutes. The patient's level of consciousness and vital signs were monitored continuously by radiology nursing throughout the procedure under my direct supervision. CONTRAST:  75 cc Omnipaque  300 FLUOROSCOPY TIME:  20 minutes, 30 seconds (5409 mGy) COMPLICATIONS: None immediate. PROCEDURE: Informed consent was  obtained from the patient's daughter following explanation of the procedure, risks, benefits and alternatives. All questions were addressed. A time out was performed prior to the initiation of the procedure. Maximal barrier sterile technique utilized including caps, mask, sterile gowns, sterile gloves, large sterile drape, hand hygiene, and Betadine prep. The right femoral head was marked fluoroscopically. Under sterile conditions and local anesthesia, the right common femoral artery access was performed with a micropuncture needle. Under direct ultrasound guidance, the right common femoral was accessed with a micropuncture kit. An ultrasound image was saved for documentation purposes. This allowed for placement of a 5-French vascular sheath. A limited arteriogram was performed through the side arm of the sheath confirming appropriate access within the right common femoral artery. Over a Bentson wire, a Mickelson catheter was advanced the caudal aspect of the thoracic aorta where was reformed, back bled and flushed. The Mickelson catheter was then utilized to select the celiac artery and a selective celiac arteriogram was performed. Next, with the use of a fathom 14 microwire, a STC microcatheter was advanced to the level of the gastroduodenal artery and a gastroduodenal arteriogram was performed. The microcatheter was then utilized to select the pancreaticoduodenal artery and a selective pancreaticoduodenal arteriogram performed. The peripheral aspect of the pancreaticoduodenal artery was then percutaneously coil embolized with multiple overlapping 3 mm and 4 mm interlock coils. Post embolization arteriogram was performed from level of the GDA. The microcatheter was then retracted to the level of the gastroduodenal artery and the gastroduodenal artery was embolized to near its origin with multiple overlapping 5 mm and 6 mm interlock coils. The Microcatheter was drawn to the level of the common hepatic artery and a  selective common hepatic arteriogram was performed. The microcatheter was removed and a celiac arteriogram was performed through the [facility] catheter. Next, with the use of a fathom 14 microwire, a high-flow microcatheter was advanced to the level of the proper hepatic artery and a proper hepatic arteriogram was performed. The microcatheter was utilized to select the anterior division of the right hepatic artery and a selective arteriogram was performed. Next, the anterior division of the right hepatic artery was percutaneously embolized with approximately 4/5 of one vial of 500-700 Embospheres. Post embolization arteriogram was performed. Next, the remainder of the right hepatic artery was particle embolized with the remaining 1 and 1/5 vials of 500-700 Embospheres. The microcatheter was retracted to the level of the common hepatic artery and completion arteriograms were performed. Images were reviewed and the procedure was terminated. All wires and catheters were removed from the patient. Given patient's elevated INR (greater than 4), the vascular sheath was secured at the right groin access site within interrupted suture and connected to a pressure bag. The patient tolerated the procedure well without immediate post procedural complication. FINDINGS: Selective celiac arteriogram demonstrates conventional anatomy. Note is again made of parasitized vascularity from multiple divisions of the right hepatic artery as well as from a hypertrophy pancreaticoduodenal artery as was suggested on preceding CTA. Additionally, there is early AV shunting about right lobe of the liver likely secondary to patient's background of cirrhosis. Previously identified area of active arterial extravasation demonstrated on preceding abdominal CT is not definitely angiographically. Selective pancreaticoduodenal arteriogram demonstrates apparent parasitized vascularity to the gallbladder fossa, again with abnormal AV shunting. Following  percutaneous coil embolization of both the pancreaticoduodenal and gastroduodenal arteries, there is complete stasis of  flow through this vascular distribution. Selective arteriograms of the anterior division of the right hepatic artery as well as the main right hepatic artery demonstrates abnormal vascularity and parasitized flow about the gallbladder fossa. Following percutaneous particle embolization, there is marked reduction in flow and pruning of the peripheral vasculature throughout the right hepatic arterial vascular distribution. IMPRESSION: 1. Technically successful percutaneous coil embolization of the pancreaticoduodenal and gastroduodenal arteries felt to contribute to paired to rise parasitized abnormal flow to the gallbladder fossa. 2. Technically successful prophylactic percutaneous particle embolization of the anterior division of the right hepatic artery as well as the right hepatic artery due to abnormal vascularity about the gallbladder fossa. PLAN: - Continue resuscitative measures as per the ICU staff. - The patient is to remain flat while vascular sheath remains in place. - Plan to remove vascular sheath once INR below 1.5 (ideally). Electronically Signed   By: Sandi Mariscal M.D.   On: 07/08/2020 09:55   IR CHOLANGIOGRAM EXISTING TUBE  Result Date: 07/09/2020 INDICATION: Attempted cholecystectomy complicated by hemorrhage requiring arterial embolization. A balloon retention catheter was placed surgically into the gallbladder lumen. EXAM: CHOLANGIOGRAM THROUGH EXISTING CATHETER MEDICATIONS: None indicated ANESTHESIA/SEDATION: None required FLUOROSCOPY TIME:  2 minutes 48 seconds; 67 mGy COMPLICATIONS: None immediate. PROCEDURE: Under fluoroscopy, contrast was injected through the surgically placed cholecystostomy catheter. Injection demonstrates good position of the catheter and retention balloon within the lumen of the nondilated gallbladder. There is irregular debris within the lumen of the  gallbladder which may represent sludge and/or stones. Embolization coils are noted in the region of the hepatic artery. The cystic duct is patent. There is some opacification of the common hepatic duct. With continued injection, meniscus is seen in the proximal CBD with no distal passage of contrast. No extravasation identified. With aspiration, the gallbladder and biliary tree are decompressed. The catheter was replaced to external drainage. IMPRESSION: 1. Obstructing calculus in the proximal CBD. 2. Cholelithiasis 3. Cystic duct is patent and communicates with the intrahepatic biliary tree. Electronically Signed   By: Lucrezia Europe M.D.   On: 07/09/2020 17:36   IR EMBO ART  VEN HEMORR LYMPH EXTRAV  INC GUIDE ROADMAPPING  Result Date: 07/08/2020 INDICATION: History of cirrhosis and end-stage renal disease, currently on dialysis now with attempted though non successful cholecystectomy with postoperative bleeding found to have active extravasation about the gallbladder fossa. Please perform mesenteric arteriogram and percutaneous embolization as indicated. EXAM: 1. ULTRASOUND GUIDANCE FOR ARTERIAL ACCESS 2. SELECTIVE CELIAC ARTERIOGRAM 3. SELECTIVE PANCREATICODUODENAL ARTERIOGRAM AND PERCUTANEOUS COIL EMBOLIZATION 4. SELECTIVE GASTRODUODENAL ARTERIOGRAM AND PERCUTANEOUS COIL EMBOLIZATION 5. SELECTIVE PROPER HEPATIC ARTERIOGRAM 6. SELECTIVE ARTERIOGRAM OF ANTERIOR DIVISION OF THE RIGHT HEPATIC ARTERY AND PERCUTANEOUS PARTICLE EMBOLIZATION 7. SELECTIVE ARTERIOGRAM OF THE RIGHT HEPATIC ARTERY AND PERCUTANEOUS PARTICLE EMBOLIZATION COMPARISON:  CT abdomen and pelvis-earlier same day MEDICATIONS: None ANESTHESIA/SEDATION: Moderate (conscious) sedation was employed during this procedure. A total of Versed 4 mg and Fentanyl 200 mcg was administered intravenously. Moderate Sedation Time: 72 minutes. The patient's level of consciousness and vital signs were monitored continuously by radiology nursing throughout the procedure  under my direct supervision. CONTRAST:  75 cc Omnipaque 300 FLUOROSCOPY TIME:  20 minutes, 30 seconds (3500 mGy) COMPLICATIONS: None immediate. PROCEDURE: Informed consent was obtained from the patient's daughter following explanation of the procedure, risks, benefits and alternatives. All questions were addressed. A time out was performed prior to the initiation of the procedure. Maximal barrier sterile technique utilized including caps, mask, sterile gowns, sterile gloves, large sterile drape,  hand hygiene, and Betadine prep. The right femoral head was marked fluoroscopically. Under sterile conditions and local anesthesia, the right common femoral artery access was performed with a micropuncture needle. Under direct ultrasound guidance, the right common femoral was accessed with a micropuncture kit. An ultrasound image was saved for documentation purposes. This allowed for placement of a 5-French vascular sheath. A limited arteriogram was performed through the side arm of the sheath confirming appropriate access within the right common femoral artery. Over a Bentson wire, a Mickelson catheter was advanced the caudal aspect of the thoracic aorta where was reformed, back bled and flushed. The Mickelson catheter was then utilized to select the celiac artery and a selective celiac arteriogram was performed. Next, with the use of a fathom 14 microwire, a STC microcatheter was advanced to the level of the gastroduodenal artery and a gastroduodenal arteriogram was performed. The microcatheter was then utilized to select the pancreaticoduodenal artery and a selective pancreaticoduodenal arteriogram performed. The peripheral aspect of the pancreaticoduodenal artery was then percutaneously coil embolized with multiple overlapping 3 mm and 4 mm interlock coils. Post embolization arteriogram was performed from level of the GDA. The microcatheter was then retracted to the level of the gastroduodenal artery and the  gastroduodenal artery was embolized to near its origin with multiple overlapping 5 mm and 6 mm interlock coils. The Microcatheter was drawn to the level of the common hepatic artery and a selective common hepatic arteriogram was performed. The microcatheter was removed and a celiac arteriogram was performed through the [facility] catheter. Next, with the use of a fathom 14 microwire, a high-flow microcatheter was advanced to the level of the proper hepatic artery and a proper hepatic arteriogram was performed. The microcatheter was utilized to select the anterior division of the right hepatic artery and a selective arteriogram was performed. Next, the anterior division of the right hepatic artery was percutaneously embolized with approximately 4/5 of one vial of 500-700 Embospheres. Post embolization arteriogram was performed. Next, the remainder of the right hepatic artery was particle embolized with the remaining 1 and 1/5 vials of 500-700 Embospheres. The microcatheter was retracted to the level of the common hepatic artery and completion arteriograms were performed. Images were reviewed and the procedure was terminated. All wires and catheters were removed from the patient. Given patient's elevated INR (greater than 4), the vascular sheath was secured at the right groin access site within interrupted suture and connected to a pressure bag. The patient tolerated the procedure well without immediate post procedural complication. FINDINGS: Selective celiac arteriogram demonstrates conventional anatomy. Note is again made of parasitized vascularity from multiple divisions of the right hepatic artery as well as from a hypertrophy pancreaticoduodenal artery as was suggested on preceding CTA. Additionally, there is early AV shunting about right lobe of the liver likely secondary to patient's background of cirrhosis. Previously identified area of active arterial extravasation demonstrated on preceding abdominal CT is not  definitely angiographically. Selective pancreaticoduodenal arteriogram demonstrates apparent parasitized vascularity to the gallbladder fossa, again with abnormal AV shunting. Following percutaneous coil embolization of both the pancreaticoduodenal and gastroduodenal arteries, there is complete stasis of flow through this vascular distribution. Selective arteriograms of the anterior division of the right hepatic artery as well as the main right hepatic artery demonstrates abnormal vascularity and parasitized flow about the gallbladder fossa. Following percutaneous particle embolization, there is marked reduction in flow and pruning of the peripheral vasculature throughout the right hepatic arterial vascular distribution. IMPRESSION: 1. Technically successful percutaneous coil  embolization of the pancreaticoduodenal and gastroduodenal arteries felt to contribute to paired to rise parasitized abnormal flow to the gallbladder fossa. 2. Technically successful prophylactic percutaneous particle embolization of the anterior division of the right hepatic artery as well as the right hepatic artery due to abnormal vascularity about the gallbladder fossa. PLAN: - Continue resuscitative measures as per the ICU staff. - The patient is to remain flat while vascular sheath remains in place. - Plan to remove vascular sheath once INR below 1.5 (ideally). Electronically Signed   By: Sandi Mariscal M.D.   On: 07/08/2020 09:55    Anti-infectives: Anti-infectives (From admission, onward)   Start     Dose/Rate Route Frequency Ordered Stop   07/08/20 1800  piperacillin-tazobactam (ZOSYN) IVPB 3.375 g     Discontinue     3.375 g 100 mL/hr over 30 Minutes Intravenous Every 6 hours 07/08/20 1045     07/07/20 2200  piperacillin-tazobactam (ZOSYN) IVPB 3.375 g  Status:  Discontinued        3.375 g 100 mL/hr over 30 Minutes Intravenous Every 12 hours 07/07/20 0841 07/08/20 1045   06/19/2020 1300  piperacillin-tazobactam (ZOSYN) IVPB  2.25 g  Status:  Discontinued        2.25 g 100 mL/hr over 30 Minutes Intravenous Every 8 hours 06/30/2020 0835 06/18/2020 1120   06/26/2020 1300  piperacillin-tazobactam (ZOSYN) IVPB 3.375 g  Status:  Discontinued        3.375 g 100 mL/hr over 30 Minutes Intravenous Every 6 hours 07/17/2020 1120 07/07/20 0841   06/27/2020 1200  piperacillin-tazobactam (ZOSYN) IVPB 3.375 g  Status:  Discontinued        3.375 g 100 mL/hr over 30 Minutes Intravenous Every 6 hours 06/21/2020 0652 07/11/2020 0835   07/03/2020 0430  piperacillin-tazobactam (ZOSYN) IVPB 2.25 g  Status:  Discontinued        2.25 g 100 mL/hr over 30 Minutes Intravenous Every 8 hours 07/11/2020 2147 06/19/2020 0652   07/16/2020 2000  piperacillin-tazobactam (ZOSYN) IVPB 3.375 g        3.375 g 12.5 mL/hr over 240 Minutes Intravenous  Once 07/08/2020 1957 07/15/2020 2328      Assessment/Plan: ESRD on HD T/Th/Sat Hepatocellular carcinoma(dx 02/2015 S/P thermoablation follows with Dr. Burr Medico) Alcoholic liver cirrhosis  - INR 1.2>2.1>2.1>1.8>2.5  - 2 units FFP during HD HTN HLD Thrombocytopenia  - Plt 78>16>45>129>66 Acute on chronic anemia Hyponatremia/hypochloremia    Choledocholithiasis Acute cholecystitis  - MRCP shows choledocholithiasis, possible acute cholecystitis with gallbladder distension and mild pericholecystic edema, concern for possible gallbladder neoplasm, and cirrhosis with trace abdominal ascites - S/p unsuccessful ERCP 7/18: due to distorted anatomy (very dilated gallbladder and cystic duct) no papillary orifice was seen and attempts at cholangiogram were failed; appears to be a very distal stone -Laparoscopic cholecystectomy 07/05/20, Dr. Donnie Mesa, POD# 4  - aborted due to diffuse oozing and hypotension; cholecystostomy tube placed/ drain placed  -CT angio - embolization of neovascular arterial branch near the GB fossa 7/21  IR cholangiogram yesterday shows probable CBD stone.  Will see if next week if IR can try to get a  wire and stent past this Lipase is up.  Will follow INR down with FFP Creatinine improved, on CRRT  Remains in critical condition   LOS: 7 days    Coralie Keens 07/10/2020

## 2020-07-10 NOTE — Progress Notes (Signed)
CRITICAL VALUE ALERT  Critical Value:  18.6 Total bilirubin  Date & Time Notied:  07/10/20 @1610   Provider Notified: Dr. Duwayne Heck  Orders Received/Actions taken: No new orders received at this time.  Will continue to monitor patient.

## 2020-07-10 NOTE — Progress Notes (Signed)
NAME:  Sarah Harmon, MRN:  [id number], DOB:  08-Nov-1946, LOS: 7 ADMISSION DATE:  07/08/2020, CONSULTATION DATE:  06/22/2020 REFERRING MD: Kayleen Memos, DO CHIEF COMPLAINT:  Hemorrhagic shock   Brief History   PCCM consulted for admission to ICU after complicated cholecystectomy. Patient had Abnormal blood loss and needing massive transfusion.    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 As of 7/19 had received 7 u PRBC, 2 FFP and 1 plt. Still on high pressors.  7/20 intubated. Still in sig shock (in spite of more blood products (up to 9 units PRBC), three pressors and bicarb). Made DNR. Did not tol iHD. Plan to start CRRT.  7/21: Hgb cont to drop. 2 more PRBC.  CT abd/pelvis obtained. Reviewed by surgery: "CT are not unexpected and the patient's hemodynamics, vasopressor requirement, and response to blood transfusion do not suggest ongoing blood loss that would necessitate laparotomy. Recommend continued balanced resuscitation guided by TEG and maintenance of normothermia. Will continue to follow". Could not get HD cath to work for CRRT. Off pressors. Seems volume responsive. PLTs 51. Holding stable. INR 2.1. . Examination is positive for area of active arterial extravasation adjacent to the gallbladder fossa with associated pooling within the right upper abdomen likely supplied several branches of the anterior division of the right hepatic artery as well as potential supply from the pancreaticoduodenal branch of the GDA. Marland Kitchen  Having bloody stool went for CT angiogram underwent arteriogram with percutaneous coil embolization of the  pancreatic duodenal artery and GDA as well as embolization of the right hepatic artery. 7/22 hemodynamics improved starting to have episodes of hypoglycemia with glucose as low as 30 D10 infusion initiated still coagulopathic getting FFP.  Ventilator mechanics demonstrating concern for transfusion related lung injury, and protected tidal volume ventilation initiated.  CRRT reinitiated. 7/23: Off pressors.  Tolerating CRRT.  Interactive .  Both JP and cholecystostomy drain with serosanguineous drainage more bloody than serous.  Now pending transfer to radiology for cholangiogram through cholecystostomy tube.  Hemoglobin sitting at 7.1 down from 7.81-day ago  7/24: stable.  Off pressors.  On CRRT.    Consults:  Nephrology PCCM GI Surgery IR Procedures:  7/18 ERCP 7/19 Lap Chole  7/20 intubated for encephalopathy 7/20 trialysis catheter placement 7/21 IR embolization  Significant Diagnostic Tests:  7/17 MRCP :  Choledocholithiasis, with a 1.3 cm distal common duct stone. Gallbladder distension with mild pericholecystic edema. Findings are suspicious for acute cholecystitis. 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 Cirrhosis and segment 8 ablation defect, suboptimally evaluated without gross recurrent or metachronous hepatocellular carcinoma Hemosiderosis.Trace abdominal ascites. Similar upper pole left renal complex cystic lesion, incompletely 7/21 CT abd/pelvis: 1. Complex high attenuation fluid throughout the abdomen and pelvis consistent with hemoperitoneum.  unclear whether this represents acute hemorrhage, or residual from prior hemorrhage at the time of laparoscopy. Flattened inferior vena cava which may reflect hypovolemia. Postsurgical changes from recent laparoscopy, with punctate foci of free gas in the upper abdomen. Percutaneous drains within the right upper quadrant and  along the dome of the liver. Small bilateral pleural effusions. 7/21: 1. Examination is positive for area of active  arterial extravasation adjacent to the gallbladder fossa with associated pooling within the right upper abdomen likely supplied several branches of the anterior division of the right hepatic artery as well as potential supply from the pancreaticoduodenal branch of the GDA. 2. Multiple mixed attenuating fluid collections are seen throughout the abdomen and pelvis, nearly all of which contain hematocrit levels, compatible with of evolving hematomas. 3. Scattered atherosclerotic plaque within normal caliber abdominal aorta.4. Suspected approximately 50% luminal narrowing involving theorigin the celiac artery.  Micro Data:  7/18 blood>>   Antimicrobials:  Zosyn 7/17 >>  Interim history/subjective:  Off pressors   Objective   Blood pressure (!) 177/46, pulse 66, temperature (!) 95.5 F (35.3 C), resp. rate 21, height 5\' 3"  (1.6 m), weight 71 kg, SpO2 100 %. CVP:  [10 mmHg-20 mmHg] 19 mmHg  Vent Mode: PRVC FiO2 (%):  [40 %-50 %] 40 % Set Rate:  [20 bmp] 20 bmp Vt Set:  [360 mL] 360 mL PEEP:  [8 cmH20] 8 cmH20 Plateau Pressure:  [16 cmH20-27 cmH20] 25 cmH20   Intake/Output Summary (Last 24 hours) at 07/10/2020 1029 Last data filed at 07/10/2020 1000 Gross per 24 hour  Intake 3682.77 ml  Output 7943 ml  Net -4260.23 ml   Filed Weights   07/07/20 1124 07/09/20 0500 07/10/20 0426  Weight: 69 kg 69.2 kg 71 kg    Examination: General: This is a frail appearing 74 year old critically ill female remains on full ventilator support as well as CRRT HEENT normocephalic atraumatic orally intubated.  Right IJ HD catheter dressing is unremarkable Pulmonary: Intubated, CTAB diminished,  Cardiac: Regular rate and rhythm without murmur rub or gallop Abdomen: Distended but soft, tender.  Drains intact with bloody serous output GU: Minimal urine output Extremities: Warm dry Neuro:  awake, blinks to threat, makes eye contact intermittently, but not following commands.    Resolved Hospital Problem list   Shock: Multifactorial: Hemorrhagic and hypovolemic Lactic acidosis/anion gap metabolic acidosis resolved   Assessment & Plan:  Choledocholithiasis with acute cholecystitis status post attempted lap chole complicated by intraoperative acute blood loss and hemorrhagic shock->still has CBD obstrution Plan Continue Zosyn. (7/17-current) Persistent CBD obstruction per cholangiogram yesterday Cholangiogram yesterday: persitent CBD obstruction, patent cystic duct.   Ongoing management to to be determined by both surgical and interventional radiology team    Acute hypoxic vent dependent respiratory failure. Intubated for progressive encephalopathy.  Ongoing ventilator needs likely exacerbated by transfusion related lung injury -We are actively weaning PEEP and FiO2 Plan Continue volume removal as tolerated via CRRT Wean PEEP/FiO2, daily PST as tolerates.  Pao2 quite low at 48. VAP bundle PAD protocol with RASS goal -1 A.m. chest x-ray F/u ABG  ESRD w/ fluid and electrolyte imbalance on TThS Plan Continuing CRRT to maximize volume status and facilitate ventilator weaning Continue to check serial chemistries Replace electrolytes as needed  Cirrhosis 2/2 chronic (former) ETOH use; currently decompensated due to critical illness, hypoperfusion. Plan Continue to trend LFTs  Acute on Chronic thrombocytopenia due to active bleeding superimposed on underlying cirrhosis Plan Continue to monitor Cont vit K Transfuse for platelet count less than 50,000 given propensity to bleeding A.m. CBC  Acute blood loss anemia and coagulopathy 2/2 cirrhosis, consumptive from bleeding from active mesenteric bleeding.  Received 3 more units of FFP on 7/22 -Status post embolization embolization of the pancreatic duodenal artery and GDA as well as embolization of the right hepatic  artery.  On 7/21, since then hemoglobin seems to be stabilizing -Remains coagulopathic  Plan Monitor INR Transfuse for hemoglobin less than 7 Remains on protonix BID   RUE swelling ? IV infiltrate vs DVT -not candidate for systemic therapeutic ac Plan Continue to keep elevated, consider right upper extremity ultrasound if stabilizes although she is not a anticoagulation candidate   Hypoglycemia likely due to cirrhosis and acute illness Plan Continue D10 with every 2 hour Accu-Cheks  Acute encephalopathy- waxing and waning Plan Supportive care Started lactulose yesterday  Best practice:  Diet: trickle TF, continue Pain/Anxiety/Delirium protocol (if indicated): yes VAP protocol (if indicated): yes DVT prophylaxis: SCDs GI prophylaxis: pantoprazole Glucose control:  Mobility: Bed rest Code Status: Full Family Communication: Pending for 7/23 Disposition: ICU  Labs   CBC: Recent Labs  Lab 06/28/2020 0026 06/26/2020 0628 07/12/2020 2034 07/08/2020 2057 07/08/20 0512 07/08/20 0512 07/08/20 1844 07/08/20 2342 07/09/20 0058 07/09/20 0630 07/09/20 1229 07/10/20 0513  WBC 7.5   < > 9.8   < > 12.9*  --  15.7* 14.8*  --  13.7*  --  11.6*  NEUTROABS 6.0  --  8.3*  --   --   --   --  13.7*  --   --   --   --   HGB 8.4*   < > 10.6*   < > 7.9*   < > 8.0* 7.8*  --  7.1* 7.5* 7.8*  HCT 25.6*   < > 32.9*   < > 23.6*   < > 25.4* 24.7*  --  22.4* 22.0* 24.7*  MCV 91.1   < > 93.2   < > 89.4  --  91.0 91.1  --  91.4  --  91.5  PLT 99*   < > 79*   < > 129*   < > 99* 86* 84* 66*  --  46*   < > = values in this interval not displayed.    Basic Metabolic Panel: Recent Labs  Lab 06/27/2020 0026 07/02/2020 0628 06/18/2020 2034 07/03/2020 2057 07/07/20 0546 07/07/20 2059 07/08/20 0512 07/08/20 0512 07/08/20 1600 07/08/20 1600 07/09/20 0112 07/09/20 0452 07/09/20 1229 07/09/20 1600 07/10/20 0513  NA 129*   < > 133*   < > 141   < > 139   < > 137   < > 133* 135 137 135 134*  K 3.9   < >  6.1*   < > 4.3   < > 3.7   < > 4.0   < > 3.8 3.7 3.9 3.8 3.7  CL 91*   < > 104   < > 100   < > 95*   < > 96*  --  97* 97*  --  98 98  CO2 26   < > 13*   < > 11*   < > 20*   < > 22  --  21* 23  --  22 24  GLUCOSE 93   < > 112*   < > 140*   < > 75   < > 87  --  144* 144*  --  132* 120*  BUN 20   < > 34*   < > 30*   < > 14   < > 13  --  9 9  --  9 7*  CREATININE 4.49*   < > 5.82*   < > 6.08*   < > 3.39*   < > 3.00*  --  2.17* 1.97*  --  1.75* 1.23*  CALCIUM 8.8*   < > 6.8*   < >  9.4   < > 10.8*   < > 9.9  --  9.4 9.9  --  10.0 9.5  MG 1.8  --  1.8  --  1.8  --   --   --   --   --  1.9  --   --   --  2.3  PHOS  --   --   --    < > 8.2*  --  7.4*  --  6.4*  --   --  3.9  --  3.4 2.2*   < > = values in this interval not displayed.   GFR: Estimated Creatinine Clearance: 37.3 mL/min (A) (by C-G formula based on SCr of 1.23 mg/dL (H)). Recent Labs  Lab 07/12/2020 2034 07/07/2020 2037 06/20/2020 0519 06/27/2020 1253 07/08/20 1844 07/08/20 2342 07/09/20 0630 07/10/20 0513  WBC   < >  --  6.8   < > 15.7* 14.8* 13.7* 11.6*  LATICACIDVEN  --  7.9* >11.0*  --   --   --   --   --    < > = values in this interval not displayed.    Liver Function Tests: Recent Labs  Lab 06/21/2020 0519 07/07/20 0042 07/07/20 0546 07/07/20 0546 07/07/20 2059 07/07/20 2059 07/08/20 0512 07/08/20 0512 07/08/20 1600 07/09/20 0112 07/09/20 0452 07/09/20 1600 07/10/20 0513  AST 2,666*  --  >10,000*  --  6,355*  --  5,112*  --   --  3,737*  --   --   --   ALT 1,035*  --  3,977*  --  2,059*  --  1,698*  --   --  1,453*  --   --   --   ALKPHOS 63  --  174*  --  148*  --  161*  --   --  185*  --   --   --   BILITOT 6.8*  --  7.7*  --  6.8*  --  8.9*  --   --  11.9*  --   --   --   PROT 5.2*  --  5.0*  --  5.9*  --  6.5  --   --  6.3*  --   --   --   ALBUMIN 2.5*   < > 2.8*   < > 3.5   < > 3.9   < > 3.6 3.5 3.7 3.6 3.6   < > = values in this interval not displayed.   Recent Labs  Lab 06/21/2020 1506 07/10/20 0513    LIPASE 34 134*   Recent Labs  Lab 07/09/20 0452  AMMONIA 33    ABG    Component Value Date/Time   PHART 7.439 07/09/2020 1229   PCO2ART 35.5 07/09/2020 1229   PO2ART 48 (L) 07/09/2020 1229   HCO3 24.4 07/09/2020 1229   TCO2 25 07/09/2020 1229   ACIDBASEDEF 8.0 (H) 07/17/2020 0356   O2SAT 88.0 07/09/2020 1229     Coagulation Profile: Recent Labs  Lab 07/08/20 0512 07/08/20 1844 07/08/20 2342 07/09/20 0058 07/10/20 0513  INR 1.8* 2.4* 2.6* 2.5* 1.9*    Cardiac Enzymes: No results for input(s): CKTOTAL, CKMB, CKMBINDEX, TROPONINI in the last 168 hours.  HbA1C: Hgb A1c MFr Bld  Date/Time Value Ref Range Status  12/01/2019 09:40 AM 4.4 (L) 4.6 - 6.5 % Final    Comment:    Glycemic Control Guidelines for People with Diabetes:Non Diabetic:  <6%Goal of Therapy: <7%Additional Action Suggested:  >8%   06/06/2019  12:25 PM 5.1 4.6 - 6.5 % Final    Comment:    Glycemic Control Guidelines for People with Diabetes:Non Diabetic:  <6%Goal of Therapy: <7%Additional Action Suggested:  >8%     CBG: Recent Labs  Lab 07/10/20 0203 07/10/20 0322 07/10/20 0617 07/10/20 0731 07/10/20 1011  GLUCAP 140* 108* 117* 118* 124*    My critical care time is 35 minutes  Collier Bullock, MD

## 2020-07-11 ENCOUNTER — Inpatient Hospital Stay (HOSPITAL_COMMUNITY): Payer: Medicare Other

## 2020-07-11 LAB — COMPREHENSIVE METABOLIC PANEL
ALT: 731 U/L — ABNORMAL HIGH (ref 0–44)
AST: 826 U/L — ABNORMAL HIGH (ref 15–41)
Albumin: 3.1 g/dL — ABNORMAL LOW (ref 3.5–5.0)
Alkaline Phosphatase: 260 U/L — ABNORMAL HIGH (ref 38–126)
Anion gap: 15 (ref 5–15)
BUN: <5 mg/dL — ABNORMAL LOW (ref 8–23)
CO2: 20 mmol/L — ABNORMAL LOW (ref 22–32)
Calcium: 10.1 mg/dL (ref 8.9–10.3)
Chloride: 99 mmol/L (ref 98–111)
Creatinine, Ser: 1.01 mg/dL — ABNORMAL HIGH (ref 0.44–1.00)
GFR calc Af Amer: >60 mL/min (ref >60)
GFR calc non Af Amer: 54 mL/min — ABNORMAL LOW (ref >60)
Glucose, Bld: 123 mg/dL — ABNORMAL HIGH (ref 70–99)
Potassium: 3.9 mmol/L (ref 3.5–5.1)
Sodium: 134 mmol/L — ABNORMAL LOW (ref 135–145)
Total Bilirubin: 18.6 mg/dL (ref 0.3–1.2)
Total Protein: 7 g/dL (ref 6.5–8.1)

## 2020-07-11 LAB — CBC WITH DIFFERENTIAL/PLATELET
Abs Immature Granulocytes: 1.13 10*3/uL — ABNORMAL HIGH (ref 0.00–0.07)
Basophils Absolute: 0.1 10*3/uL (ref 0.0–0.1)
Basophils Relative: 1 %
Eosinophils Absolute: 0 10*3/uL (ref 0.0–0.5)
Eosinophils Relative: 0 %
HCT: 29 % — ABNORMAL LOW (ref 36.0–46.0)
Hemoglobin: 9.5 g/dL — ABNORMAL LOW (ref 12.0–15.0)
Immature Granulocytes: 7 %
Lymphocytes Relative: 0 %
Lymphs Abs: 0 10*3/uL — ABNORMAL LOW (ref 0.7–4.0)
MCH: 30.5 pg (ref 26.0–34.0)
MCHC: 32.8 g/dL (ref 30.0–36.0)
MCV: 93.2 fL (ref 80.0–100.0)
Monocytes Absolute: 1.3 10*3/uL — ABNORMAL HIGH (ref 0.1–1.0)
Monocytes Relative: 8 %
Neutro Abs: 13.9 10*3/uL — ABNORMAL HIGH (ref 1.7–7.7)
Neutrophils Relative %: 84 %
Platelets: 58 10*3/uL — ABNORMAL LOW (ref 150–400)
RBC: 3.11 MIL/uL — ABNORMAL LOW (ref 3.87–5.11)
RDW: 16 % — ABNORMAL HIGH (ref 11.5–15.5)
WBC: 16.4 10*3/uL — ABNORMAL HIGH (ref 4.0–10.5)
nRBC: 79.3 % — ABNORMAL HIGH (ref 0.0–0.2)

## 2020-07-11 LAB — RENAL FUNCTION PANEL
Albumin: 2.7 g/dL — ABNORMAL LOW (ref 3.5–5.0)
Anion gap: 12 (ref 5–15)
BUN: 5 mg/dL — ABNORMAL LOW (ref 8–23)
CO2: 21 mmol/L — ABNORMAL LOW (ref 22–32)
Calcium: 10.1 mg/dL (ref 8.9–10.3)
Chloride: 100 mmol/L (ref 98–111)
Creatinine, Ser: 1.01 mg/dL — ABNORMAL HIGH (ref 0.44–1.00)
GFR calc Af Amer: >60 mL/min (ref >60)
GFR calc non Af Amer: 54 mL/min — ABNORMAL LOW (ref >60)
Glucose, Bld: 90 mg/dL (ref 70–99)
Phosphorus: 3.4 mg/dL (ref 2.5–4.6)
Potassium: 4.4 mmol/L (ref 3.5–5.1)
Sodium: 133 mmol/L — ABNORMAL LOW (ref 135–145)

## 2020-07-11 LAB — PREPARE PLATELET PHERESIS: Unit division: 0

## 2020-07-11 LAB — PROTIME-INR
INR: 2.4 — ABNORMAL HIGH (ref 0.8–1.2)
Prothrombin Time: 25.3 seconds — ABNORMAL HIGH (ref 11.4–15.2)

## 2020-07-11 LAB — GLUCOSE, CAPILLARY
Glucose-Capillary: 107 mg/dL — ABNORMAL HIGH (ref 70–99)
Glucose-Capillary: 118 mg/dL — ABNORMAL HIGH (ref 70–99)
Glucose-Capillary: 119 mg/dL — ABNORMAL HIGH (ref 70–99)
Glucose-Capillary: 40 mg/dL — CL (ref 70–99)
Glucose-Capillary: 75 mg/dL (ref 70–99)
Glucose-Capillary: 80 mg/dL (ref 70–99)
Glucose-Capillary: 93 mg/dL (ref 70–99)

## 2020-07-11 LAB — BPAM PLATELET PHERESIS
Blood Product Expiration Date: 202107252359
ISSUE DATE / TIME: 202107241611
Unit Type and Rh: 6200

## 2020-07-11 LAB — PHOSPHORUS: Phosphorus: 2.7 mg/dL (ref 2.5–4.6)

## 2020-07-11 LAB — LIPASE, BLOOD: Lipase: 79 U/L — ABNORMAL HIGH (ref 11–51)

## 2020-07-11 LAB — MAGNESIUM: Magnesium: 2.5 mg/dL — ABNORMAL HIGH (ref 1.7–2.4)

## 2020-07-11 LAB — AMMONIA: Ammonia: 28 umol/L (ref 9–35)

## 2020-07-11 MED ORDER — SODIUM CHLORIDE 0.9 % IV SOLN
1.0000 g | INTRAVENOUS | Status: DC
  Filled 2020-07-11: qty 1

## 2020-07-11 MED ORDER — SODIUM CHLORIDE 0.9 % IV SOLN
1.0000 g | Freq: Three times a day (TID) | INTRAVENOUS | Status: DC
  Administered 2020-07-11: 1 g via INTRAVENOUS
  Filled 2020-07-11 (×3): qty 1

## 2020-07-11 MED ORDER — VANCOMYCIN HCL 750 MG/150ML IV SOLN
750.0000 mg | INTRAVENOUS | Status: DC
  Administered 2020-07-11: 750 mg via INTRAVENOUS
  Filled 2020-07-11: qty 150

## 2020-07-11 MED ORDER — VANCOMYCIN HCL 750 MG/150ML IV SOLN: 750.0000 mg | INTRAVENOUS | Status: DC

## 2020-07-11 NOTE — Progress Notes (Signed)
Pt transported to CT by Rip Harbour, RN, Respiratory tech and CT transporter.

## 2020-07-11 NOTE — Progress Notes (Signed)
[facility] Progress Note Patient Name: Sarah Harmon DOB: 1946/03/22 MRN: [id number]   Date of Service  07/11/2020  HPI/Events of Note  Nursing reports vomiting around ETT. TF on hold. OG placed to LIS. Patient is already on D10W IV infusion at 75 mL/hour. Sat = 100%.  eICU Interventions  Plan: 1. Portable CXR at 5 AM.      Intervention Category Major Interventions: Other:  Marena Witts Cornelia Copa 07/11/2020, 1:26 AM

## 2020-07-11 NOTE — Progress Notes (Signed)
[facility] Progress Note  Name: Sarah Harmon MRN: [id number] DOB: [birth year]  Chief Complaint:  abd pain   Subjective:  Remains intubated and sedated, no pressors Tolerating CRRT well  --> UF 5L yesterday in effort to help wean vent support, backed off to net even in the PM; on PSV this AM Emesis yesterday and TF residual > 500; X ray pending eval SBO   Review of systems:  Unable to obtain 2/2 intubated   Intake/Output Summary (Last 24 hours) at 07/11/2020 1010 Last data filed at 07/11/2020 0900 Gross per 24 hour  Intake 3173.66 ml  Output 5749 ml  Net -2575.34 ml    Vitals:  Vitals:   07/11/20 0800 07/11/20 0830 07/11/20 0900 07/11/20 0930  BP:      Pulse: 71 73 73 73  Resp: 21 23 21 22   Temp: (!) 95.4 F (35.2 C) (!) 95.4 F (35.2 C) (!) 95.5 F (35.3 C) (!) 95.5 F (35.3 C)  TempSrc:      SpO2: 100% 100% 100% 100%  Weight:      Height:       A line MAPs are in the 80s - NIBP cuff inaccurate  Physical Exam:  General adult female in bed critically ill  HEENT normocephalic atraumatic  Lungs coarse, down to 40% FiO2 now Heart S1S2 no rub Abdomen drains in place, distended abd  Extremities no edema appreciated.  Neuro - eyes open but doesn't localize to me; RN says not following commands this AM Access LUE AVF bruit and thrill; RIJ nontunneled catheter    Medications reviewed    Labs:  BMP Latest Ref Rng & Units 07/11/2020 07/10/2020 07/10/2020  Glucose 70 - 99 mg/dL 123(H) 141(H) 120(H)  BUN 8 - 23 mg/dL <5(L) 7(L) 7(L)  Creatinine 0.44 - 1.00 mg/dL 1.01(H) 1.01(H) 1.23(H)  Sodium 135 - 145 mmol/L 134(L) 134(L) 134(L)  Potassium 3.5 - 5.1 mmol/L 3.9 3.7 3.7  Chloride 98 - 111 mmol/L 99 98 98  CO2 22 - 32 mmol/L 20(L) 20(L) 24  Calcium 8.9 - 10.3 mg/dL 10.1 9.9 9.5     Assessment/Plan:   # Shock - acute blood loss and septic.  S/p embolization arterial bleed per IR on 7/21, stable. Coagulopathy per critical care.    #Hypoxic resp  failure:  Improving, d/w PCCM -- had ^d UF to facilitate vent wean, now on PSV and 40% FiO2.   # Choledocolithiasis with acute cholecystitis - abx per primary.  now s/p aborted lap chole complicated by intraoperative hypotension and blood loss with coagulopathy. CBD stone to be intervened upon by IR per notes.  Surgery following too but not candidate for surgery in current condition.  # ESRD - s/p HD Wed,  Note normally TTS as outpatient.    No heparin noting platelets.  Per above CRRT today cont with dec in [facility] but still net neg.  On 4K fluids, continue; K 3.9 this AM.  She's hemodynamically stable and at some point could consider flipping to iHD --> d/w RN if needs to come off for test or filter change out hold resuming CRRT.     # Metabolic acidosis -  improved  # AMS/encephalopathy - intubated for airway protection. Not currently on sedation.   # Anemia - acute blood loss and ESRD.  S/p multiple PRBC's.  aranesp 100 mcg on 7/21 ordered and for weekly for now  # Metabolic bone disease continue VDRA for now   Justin Mend, MD 07/11/2020  10:10 AM

## 2020-07-11 NOTE — Progress Notes (Signed)
NAME:  Sarah Harmon, MRN:  [id number], DOB:  08-18-1946, LOS: 8 ADMISSION DATE:  07/04/2020, CONSULTATION DATE:  06/19/2020 REFERRING MD: Kayleen Memos, DO CHIEF COMPLAINT:  Hemorrhagic shock   Brief History   PCCM consulted for admission to ICU after complicated cholecystectomy. Patient had Abnormal blood loss and needing massive transfusion.    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 As of 7/19 had received 7 u PRBC, 2 FFP and 1 plt. Still on high pressors.  7/20 intubated. Still in sig shock (in spite of more blood products (up to 9 units PRBC), three pressors and bicarb). Made DNR. Did not tol iHD. Plan to start CRRT.  7/21: Hgb cont to drop. 2 more PRBC.  CT abd/pelvis obtained. Reviewed by surgery: "CT are not unexpected and the patient's hemodynamics, vasopressor requirement, and response to blood transfusion do not suggest ongoing blood loss that would necessitate laparotomy. Recommend continued balanced resuscitation guided by TEG and maintenance of normothermia. Will continue to follow". Could not get HD cath to work for CRRT. Off pressors. Seems volume responsive. PLTs 51. Holding stable. INR 2.1. . Examination is positive for area of active arterial extravasation adjacent to the gallbladder fossa with associated pooling within the right upper abdomen likely supplied several branches of the anterior division of the right hepatic artery as well as potential supply from the pancreaticoduodenal branch of the GDA. Marland Kitchen  Having bloody stool went for CT angiogram underwent arteriogram with percutaneous coil embolization of the  pancreatic duodenal artery and GDA as well as embolization of the right hepatic artery. 7/22 hemodynamics improved starting to have episodes of hypoglycemia with glucose as low as 30 D10 infusion initiated still coagulopathic getting FFP.  Ventilator mechanics demonstrating concern for transfusion related lung injury, and protected tidal volume ventilation initiated.  CRRT reinitiated. 7/23: Off pressors.  Tolerating CRRT.  Interactive .  Both JP and cholecystostomy drain with serosanguineous drainage more bloody than serous.  Now pending transfer to radiology for cholangiogram through cholecystostomy tube.  Hemoglobin sitting at 7.1 down from 7.81-day ago  7/24: stable.  Off pressors.  On CRRT.    Consults:  Nephrology PCCM GI Surgery IR Procedures:  7/18 ERCP 7/19 Lap Chole  7/20 intubated for encephalopathy 7/20 trialysis catheter placement 7/21 IR embolization  Significant Diagnostic Tests:  7/17 MRCP :  Choledocholithiasis, with a 1.3 cm distal common duct stone. Gallbladder distension with mild pericholecystic edema. Findings are suspicious for acute cholecystitis. 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 Cirrhosis and segment 8 ablation defect, suboptimally evaluated without gross recurrent or metachronous hepatocellular carcinoma Hemosiderosis.Trace abdominal ascites. Similar upper pole left renal complex cystic lesion, incompletely 7/21 CT abd/pelvis: 1. Complex high attenuation fluid throughout the abdomen and pelvis consistent with hemoperitoneum.  unclear whether this represents acute hemorrhage, or residual from prior hemorrhage at the time of laparoscopy. Flattened inferior vena cava which may reflect hypovolemia. Postsurgical changes from recent laparoscopy, with punctate foci of free gas in the upper abdomen. Percutaneous drains within the right upper quadrant and  along the dome of the liver. Small bilateral pleural effusions. 7/21: 1. Examination is positive for area of active  arterial extravasation adjacent to the gallbladder fossa with associated pooling within the right upper abdomen likely supplied several branches of the anterior division of the right hepatic artery as well as potential supply from the pancreaticoduodenal branch of the GDA. 2. Multiple mixed attenuating fluid collections are seen throughout the abdomen and pelvis, nearly all of which contain hematocrit levels, compatible with of evolving hematomas. 3. Scattered atherosclerotic plaque within normal caliber abdominal aorta.4. Suspected approximately 50% luminal narrowing involving theorigin the celiac artery.  Micro Data:  7/18 blood>>   Antimicrobials:  Zosyn 7/17 >>  Interim history/subjective:  Remains off pressors today.  Stopping CRRT.   Tolerating PSV.  Remains more altered today. Awake but not following commands.  Episode of vomiting, 600cc of volume from gastric tube.    Objective   Blood pressure (!) 153/41, pulse 73, temperature (!) 95.5 F (35.3 C), resp. rate 22, height 5\' 3"  (1.6 m), weight 65.2 kg, SpO2 100 %. CVP:  [11 mmHg-19 mmHg] 11 mmHg  Vent Mode: PSV;CPAP FiO2 (%):  [40 %] 40 % Set Rate:  [20 bmp] 20 bmp Vt Set:  [360 mL] 360 mL PEEP:  [8 cmH20] 8 cmH20 Pressure Support:  [12 cmH20] 12 cmH20 Plateau Pressure:  [21 cmH20-23 cmH20] 22 cmH20   Intake/Output Summary (Last 24 hours) at 07/11/2020 1226 Last data filed at 07/11/2020 1200 Gross per 24 hour  Intake 3129.68 ml  Output 5158 ml  Net -2028.32 ml   Filed Weights   07/09/20 0500 07/10/20 0426 07/11/20 0500  Weight: 69.2 kg 71 kg 65.2 kg    Examination: General: NAD, awake, not following commands.  HEENT normocephalic atraumatic orally intubated.  Right IJ HD catheter dressing is unremarkable Pulmonary: Intubated, CTAB diminished,  Cardiac: Regular rate and rhythm without murmur rub or  gallop Abdomen: Distended but soft, tender.  Drains intact with bloody serous output GU: Minimal urine output Extremities: Warm dry Neuro: awake, blinks to threat, makes eye contact intermittently, but not following commands.    Resolved Hospital Problem list   Shock: Multifactorial: Hemorrhagic and hypovolemic Lactic acidosis/anion gap metabolic acidosis resolved   Assessment & Plan:  Choledocholithiasis with acute cholecystitis status post attempted lap chole complicated by intraoperative acute blood loss and hemorrhagic shock->still has CBD obstrution Plan Continue Zosyn. (7/17-current) Persistent CBD obstruction per cholangiogram  Cholangiogram yesterday: persitent CBD obstruction, patent cystic duct.   Ongoing management to to be determined by both surgical and interventional radiology team  Bili increased, stable from yesterday.   Ileus:  Cont NG tube to intermittent suction Hold tube feeds.  AXR - no evidence of sbo.   Acute encephalopathy- waxing and waning Likely metabolic   Plan Supportive care Cont lactulose CT head when off CRRT.    Acute hypoxic vent dependent respiratory failure. Tolerating PSV today.  Plan Encephalopathy preventing extubation at this point.   ESRD w/ fluid and electrolyte imbalance on TThS Plan Stop CRRT, will likely start iHD on Tuesday per her normal routine.   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 Ammonia 28.  T bili increasing.  INR remains elevated.  Plan Cont lactulose.  Continue to trend LFTs Consulted GI regarding worsening Liver failure including acute encephalopathy.    Acute on Chronic thrombocytopenia due to active bleeding superimposed on underlying cirrhosis Plan Continue to monitor Transfuse for platelet count less than 50,000 given propensity to bleeding A.m. CBC  Acute blood loss anemia and coagulopathy 2/2  cirrhosis,  consumptive from bleeding from active mesenteric bleeding.  Received 3 more units of FFP on 7/22 -Status post embolization embolization of the pancreatic duodenal artery and GDA as well as embolization of the right hepatic artery.  On 7/21, since then hemoglobin seems to be stabilizing -Remains coagulopathic Plan Monitor INR Transfuse for hemoglobin less than 7 Remains on protonix BID   RUE swelling ? IV infiltrate vs DVT -not candidate for systemic therapeutic ac Plan Continue to keep elevated, consider right upper extremity ultrasound if stabilizes although she is not a anticoagulation candidate   Hypoglycemia likely due to cirrhosis and acute illness Improved.  Plan Start titrating down on D10.  Decrease to 50cc/hr today.      Best practice:  Diet:hold tf for ileus for now Pain/Anxiety/Delirium protocol (if indicated): yes VAP protocol (if indicated): yes DVT prophylaxis: SCDs GI prophylaxis: pantoprazole Glucose control:  Mobility: Bed rest Code Status: Full Family Communication: updated family today.   Disposition: ICU  Labs   CBC: Recent Labs  Lab 06/18/2020 2034 06/27/2020 2057 07/08/20 2342 07/08/20 2342 07/09/20 0058 07/09/20 0630 07/09/20 1229 07/10/20 0513 07/10/20 1510 07/11/20 0445  WBC 9.8   < > 14.8*  --   --  13.7*  --  11.6* 9.1 16.4*  NEUTROABS 8.3*  --  13.7*  --   --   --   --   --  10.3* 13.9*  HGB 10.6*   < > 7.8*   < >  --  7.1* 7.5* 7.8* 9.6* 9.5*  HCT 32.9*   < > 24.7*   < >  --  22.4* 22.0* 24.7* 29.7* 29.0*  MCV 93.2   < > 91.1  --   --  91.4  --  91.5 92.5 93.2  PLT 79*   < > 86*   < > 84* 66*  --  46* 37* 58*   < > = values in this interval not displayed.    Basic Metabolic Panel: Recent Labs  Lab 07/04/2020 2034 06/30/2020 2057 07/07/20 0546 07/07/20 2059 07/09/20 0112 07/09/20 0112 07/09/20 0452 07/09/20 0452 07/09/20 1229 07/09/20 1600 07/10/20 0513 07/10/20 1510 07/11/20 0445  NA 133*   < > 141   < > 133*   <  > 135   < > 137 135 134* 134* 134*  K 6.1*   < > 4.3   < > 3.8   < > 3.7   < > 3.9 3.8 3.7 3.7 3.9  CL 104   < > 100   < > 97*   < > 97*  --   --  98 98 98 99  CO2 13*   < > 11*   < > 21*   < > 23  --   --  22 24 20* 20*  GLUCOSE 112*   < > 140*   < > 144*   < > 144*  --   --  132* 120* 141* 123*  BUN 34*   < > 30*   < > 9   < > 9  --   --  9 7* 7* <5*  CREATININE 5.82*   < > 6.08*   < > 2.17*   < > 1.97*  --   --  1.75* 1.23* 1.01* 1.01*  CALCIUM 6.8*   < > 9.4   < > 9.4   < > 9.9  --   --  10.0 9.5 9.9 10.1  MG 1.8  --  1.8  --  1.9  --   --   --   --   --  2.3  --  2.5*  PHOS  --    < > 8.2*   < >  --   --  3.9  --   --  3.4 2.2* 2.3* 2.7   < > = values in this interval not displayed.   GFR: Estimated Creatinine Clearance: 43.7 mL/min (A) (by C-G formula based on SCr of 1.01 mg/dL (H)). Recent Labs  Lab 06/21/2020 2034 06/23/2020 2037 07/07/2020 0519 06/27/2020 1253 07/09/20 0630 07/10/20 0513 07/10/20 1510 07/11/20 0445  WBC   < >  --  6.8   < > 13.7* 11.6* 9.1 16.4*  LATICACIDVEN  --  7.9* >11.0*  --   --   --   --   --    < > = values in this interval not displayed.    Liver Function Tests: Recent Labs  Lab 07/07/20 2059 07/07/20 2059 07/08/20 0512 07/08/20 1600 07/09/20 0112 07/09/20 0112 07/09/20 0452 07/09/20 1600 07/10/20 0513 07/10/20 1510 07/11/20 0445  AST 6,355*  --  5,112*  --  3,737*  --   --   --   --  1,396* 826*  ALT 2,059*  --  1,698*  --  1,453*  --   --   --   --  937* 731*  ALKPHOS 148*  --  161*  --  185*  --   --   --   --  265* 260*  BILITOT 6.8*  --  8.9*  --  11.9*  --   --   --   --  18.6* 18.6*  PROT 5.9*  --  6.5  --  6.3*  --   --   --   --  7.9 7.0  ALBUMIN 3.5   < > 3.9   < > 3.5   < > 3.7 3.6 3.6 3.6 3.1*   < > = values in this interval not displayed.   Recent Labs  Lab 07/10/20 0513 07/11/20 0445  LIPASE 134* 79*   Recent Labs  Lab 07/09/20 0452  AMMONIA 33    ABG    Component Value Date/Time   PHART 7.439 07/09/2020 1229     PCO2ART 35.5 07/09/2020 1229   PO2ART 48 (L) 07/09/2020 1229   HCO3 24.4 07/09/2020 1229   TCO2 25 07/09/2020 1229   ACIDBASEDEF 8.0 (H) 06/21/2020 0356   O2SAT 88.0 07/09/2020 1229     Coagulation Profile: Recent Labs  Lab 07/08/20 1844 07/08/20 2342 07/09/20 0058 07/10/20 0513 07/11/20 0445  INR 2.4* 2.6* 2.5* 1.9* 2.4*    Cardiac Enzymes: No results for input(s): CKTOTAL, CKMB, CKMBINDEX, TROPONINI in the last 168 hours.  HbA1C: Hgb A1c MFr Bld  Date/Time Value Ref Range Status  12/01/2019 09:40 AM 4.4 (L) 4.6 - 6.5 % Final    Comment:    Glycemic Control Guidelines for People with Diabetes:Non Diabetic:  <6%Goal of Therapy: <7%Additional Action Suggested:  >8%   06/06/2019 12:25 PM 5.1 4.6 - 6.5 % Final    Comment:    Glycemic Control Guidelines for People with Diabetes:Non Diabetic:  <6%Goal of Therapy: <7%Additional Action Suggested:  >8%     CBG: Recent Labs  Lab 07/10/20 1958 07/11/20 0115 07/11/20 0321 07/11/20 0733 07/11/20 1215  GLUCAP 121* 107* 119* 118* 93    My critical care time is 35 minutes  Collier Bullock, MD

## 2020-07-11 NOTE — Progress Notes (Signed)
Pharmacy Antibiotic Note  Sarah Harmon is a 74 y.o. female   Concern for sepsis/worsening infection  HD patient currently on CRRT with plans to stop soon  Had been on zosyn  Height: 5\' 3"  (160 cm) Weight: 65.2 kg (143 lb 11.8 oz) IBW/kg (Calculated) : 52.4  Temp (24hrs), Avg:95.4 F (35.2 C), Min:93.6 F (34.2 C), Max:96.8 F (36 C)  Recent Labs  Lab 07/02/2020 2034 06/23/2020 2037 06/24/2020 0519 06/21/2020 1253 07/08/20 2342 07/09/20 0112 07/09/20 0452 07/09/20 0630 07/09/20 1600 07/10/20 0513 07/10/20 1510 07/11/20 0445  WBC   < >  --  6.8   < > 14.8*  --   --  13.7*  --  11.6* 9.1 16.4*  CREATININE   < >  --  6.30*   < >  --    < > 1.97*  --  1.75* 1.23* 1.01* 1.01*  LATICACIDVEN  --  7.9* >11.0*  --   --   --   --   --   --   --   --   --    < > = values in this interval not displayed.    Estimated Creatinine Clearance: 43.7 mL/min (A) (by C-G formula based on SCr of 1.01 mg/dL (H)).    No Known Allergies  Plan Meropenem 1 g q8h Vanc 750 mg q24h Monitor renal fx cx vanc lvls prn Adjust above when off CRRT>>iHD  Barth Kirks, PharmD, BCPS, BCCCP Clinical Pharmacist [phone]  Please check AMION for all [place] numbers  07/11/2020 11:16 AM

## 2020-07-11 NOTE — Progress Notes (Signed)
6 Days Post-Op   Subjective/Chief Complaint: Remains intubated   Objective: Vital signs in last 24 hours: Temp:  [93.6 F (34.2 C)-96.8 F (36 C)] 95.2 F (35.1 C) (07/25 0730) Pulse Rate:  [59-83] 68 (07/25 0731) Resp:  [18-28] 20 (07/25 0731) BP: (139-183)/(39-50) 146/39 (07/25 0731) SpO2:  [96 %-100 %] 100 % (07/25 0731) Arterial Line BP: (86-200)/(36-53) 153/43 (07/25 0730) FiO2 (%):  [40 %] 40 % (07/25 0731) Weight:  [65.2 kg] 65.2 kg (07/25 0500) Last BM Date: 07/10/20  Intake/Output from previous day: 07/24 0701 - 07/25 0700 In: 3543.6 [I.V.:1938.6; Blood:300; NG/GT:1105; IV Piggyback:200.1] Out: 6776 [Emesis/NG output:650; Drains:140; Stool:250] Intake/Output this shift: Total I/O In: 75 [I.V.:75] Out: 50 [Emesis/NG output:50]  Exam: Intubated, minimally responsive this morning Abdomen full, JP and drain in gallbladder with bloody fluid, some bile in drain  Lab Results:  Recent Labs    07/10/20 1510 07/11/20 0445  WBC 9.1 16.4*  HGB 9.6* 9.5*  HCT 29.7* 29.0*  PLT 37* 58*   BMET Recent Labs    07/10/20 1510 07/11/20 0445  NA 134* 134*  K 3.7 3.9  CL 98 99  CO2 20* 20*  GLUCOSE 141* 123*  BUN 7* <5*  CREATININE 1.01* 1.01*  CALCIUM 9.9 10.1   PT/INR Recent Labs    07/10/20 0513 07/11/20 0445  LABPROT 21.4* 25.3*  INR 1.9* 2.4*   ABG Recent Labs    07/09/20 1229  PHART 7.439  HCO3 24.4    Studies/Results: DG Chest Port 1 View  Result Date: 07/10/2020 CLINICAL DATA:  Acute respiratory failure. EXAM: PORTABLE CHEST 1 VIEW COMPARISON:  07/08/2020 FINDINGS: ET tube tip is above the carina. There is a right IJ catheter with tip projecting over the SVC. Left IJ catheter tip is in the expected location of the left innominate vein where a vascular graft is noted. Heart size is normal. There is been interval re-expansion of the left upper lobe with residual linear platelike atelectasis. Persistent left pleural effusion with persistent  atelectasis and or airspace consolidation in the left base. Small right pleural effusion is suspected with mild veil like opacification of the right lower lobe. IMPRESSION: 1. Interval re-expansion of the left upper lobe. 2. Persistent bilateral pleural effusions, left greater than right. 3. Persistent atelectasis and/or airspace consolidation in the left base. Electronically Signed   By: Kerby Moors M.D.   On: 07/10/2020 08:21   IR CHOLANGIOGRAM EXISTING TUBE  Result Date: 07/09/2020 INDICATION: Attempted cholecystectomy complicated by hemorrhage requiring arterial embolization. A balloon retention catheter was placed surgically into the gallbladder lumen. EXAM: CHOLANGIOGRAM THROUGH EXISTING CATHETER MEDICATIONS: None indicated ANESTHESIA/SEDATION: None required FLUOROSCOPY TIME:  2 minutes 48 seconds; 67 mGy COMPLICATIONS: None immediate. PROCEDURE: Under fluoroscopy, contrast was injected through the surgically placed cholecystostomy catheter. Injection demonstrates good position of the catheter and retention balloon within the lumen of the nondilated gallbladder. There is irregular debris within the lumen of the gallbladder which may represent sludge and/or stones. Embolization coils are noted in the region of the hepatic artery. The cystic duct is patent. There is some opacification of the common hepatic duct. With continued injection, meniscus is seen in the proximal CBD with no distal passage of contrast. No extravasation identified. With aspiration, the gallbladder and biliary tree are decompressed. The catheter was replaced to external drainage. IMPRESSION: 1. Obstructing calculus in the proximal CBD. 2. Cholelithiasis 3. Cystic duct is patent and communicates with the intrahepatic biliary tree. Electronically Signed   By: Keturah Barre  Vernard Gambles M.D.   On: 07/09/2020 17:36    Anti-infectives: Anti-infectives (From admission, onward)   Start     Dose/Rate Route Frequency Ordered Stop   07/08/20 1800   piperacillin-tazobactam (ZOSYN) IVPB 3.375 g     Discontinue     3.375 g 100 mL/hr over 30 Minutes Intravenous Every 6 hours 07/08/20 1045     07/07/20 2200  piperacillin-tazobactam (ZOSYN) IVPB 3.375 g  Status:  Discontinued        3.375 g 100 mL/hr over 30 Minutes Intravenous Every 12 hours 07/07/20 0841 07/08/20 1045   06/21/2020 1300  piperacillin-tazobactam (ZOSYN) IVPB 2.25 g  Status:  Discontinued        2.25 g 100 mL/hr over 30 Minutes Intravenous Every 8 hours 06/27/2020 0835 06/28/2020 1120   06/20/2020 1300  piperacillin-tazobactam (ZOSYN) IVPB 3.375 g  Status:  Discontinued        3.375 g 100 mL/hr over 30 Minutes Intravenous Every 6 hours 07/05/2020 1120 07/07/20 0841   06/23/2020 1200  piperacillin-tazobactam (ZOSYN) IVPB 3.375 g  Status:  Discontinued        3.375 g 100 mL/hr over 30 Minutes Intravenous Every 6 hours 06/21/2020 0652 06/20/2020 0835   07/17/2020 0430  piperacillin-tazobactam (ZOSYN) IVPB 2.25 g  Status:  Discontinued        2.25 g 100 mL/hr over 30 Minutes Intravenous Every 8 hours 06/22/2020 2147 07/14/2020 0652   07/02/2020 2000  piperacillin-tazobactam (ZOSYN) IVPB 3.375 g        3.375 g 12.5 mL/hr over 240 Minutes Intravenous  Once 07/07/2020 1957 07/11/2020 2328      Assessment/Plan: s/p Procedure(s): ATTEMPTED LAPAROSCOPIC CHOLECYSTECTOMY, PLACEMENT OF DRAINS (N/A)   Remains critically ill INR back up and plts down. LFT's still up and bili is 18  Hopefully IR may be able to place a perc drain transhepatically this week to decompress the CBD.  She is not a candidate for surgery and a common bile duct exploration in her condition.  Remains in critical condition.  Prognosis poor    LOS: 8 days    Coralie Keens 07/11/2020

## 2020-07-11 NOTE — Progress Notes (Signed)
0000 patient with episode of vomiting. Very clearly tube feeds from appearance. Suctioned ETT with minimal tube feed appearance. Elink notified- instructed to hold tube feeds. Placed OG to suction. No immediate residual apparent.   0200 Discovered that patient's tube was in off position.  ' 0300 Total of 650 of residual tube feeds in suction cannister. Patient has remained NPO since emesis incident. Notified MD of residual. Patient's tube feeds were at 50/hr. This was 13 hours worth of residual. CRRT numbers will reflect this with 400 numbers.   Milford Cage, RN

## 2020-07-11 NOTE — Progress Notes (Signed)
Pt transported to Ct 2105 suctioned mouth and airway before transport. Pt tolerated transport well. Returned from [place] at 2135. Suctioned mouth and airway. Pt tolerated well.

## 2020-07-11 NOTE — Progress Notes (Signed)
Pharmacy Antibiotic Note  Sarah Harmon is a 74 y.o. female admitted on 06/22/2020 with cholelithiasis s/p complex cholecystectomy with continued CBD obstruction.  Pharmacy has been consulted for meropenem and vancomycin dosing.  WBC 16, Tmin 95.2. CRRT has been stopped with plans to resume regular HD on Tuesday 7/27. Adjusted antibiotic doses accordingly.  Plan: Change Vancomycin to 750mg  Post HD Change meropenem to 1g Q 24hr Fu HD schedule and abx dosing  Monitor cultures, clinical status, vancomycin levels as needed  Narrow abx as able and f/u duration    Height: 5\' 3"  (160 cm) Weight: 65.2 kg (143 lb 11.8 oz) IBW/kg (Calculated) : 52.4  Temp (24hrs), Avg:95.8 F (35.4 C), Min:95.2 F (35.1 C), Max:98.4 F (36.9 C)  Recent Labs  Lab 07/07/2020 2034 07/03/2020 2037 06/21/2020 0519 06/21/2020 1253 07/08/20 2342 07/09/20 0112 07/09/20 0452 07/09/20 0630 07/09/20 1600 07/10/20 0513 07/10/20 1510 07/11/20 0445 07/11/20 1517  WBC   < >  --  6.8   < > 14.8*  --   --  13.7*  --  11.6* 9.1 16.4*  --   CREATININE   < >  --  6.30*   < >  --    < >   < >  --  1.75* 1.23* 1.01* 1.01* 1.01*  LATICACIDVEN  --  7.9* >11.0*  --   --   --   --   --   --   --   --   --   --    < > = values in this interval not displayed.    Estimated Creatinine Clearance: 43.7 mL/min (A) (by C-G formula based on SCr of 1.01 mg/dL (H)).    No Known Allergies  Antimicrobials this admission: Zosyn 7/17 >> 7/25 Meropenem 7/25>> Vanc 7/25>>  Dose adjustments this admission: 7/25 Vanc 750mg  Q24 hr on CRRT > 750mg  post HD  7/25 Meropenem 1g Q8 hr on CRRT to 1g Q24hr   Microbiology results: 7/18 Bcx: ngtd   7/18 MRSA PCR: negative 7/25 blood x2    Thank you for allowing pharmacy to be a part of this patient's care.  Benetta Spar, PharmD, BCPS, BCCP Clinical Pharmacist  Please check AMION for all [place] phone numbers After 10:00 PM, call [place] [phone]

## 2020-07-12 ENCOUNTER — Inpatient Hospital Stay (HOSPITAL_COMMUNITY): Payer: Medicare Other

## 2020-07-12 LAB — GLUCOSE, CAPILLARY
Glucose-Capillary: 101 mg/dL — ABNORMAL HIGH (ref 70–99)
Glucose-Capillary: 140 mg/dL — ABNORMAL HIGH (ref 70–99)
Glucose-Capillary: 145 mg/dL — ABNORMAL HIGH (ref 70–99)
Glucose-Capillary: 155 mg/dL — ABNORMAL HIGH (ref 70–99)
Glucose-Capillary: 189 mg/dL — ABNORMAL HIGH (ref 70–99)
Glucose-Capillary: 60 mg/dL — ABNORMAL LOW (ref 70–99)
Glucose-Capillary: 61 mg/dL — ABNORMAL LOW (ref 70–99)
Glucose-Capillary: 79 mg/dL (ref 70–99)
Glucose-Capillary: 88 mg/dL (ref 70–99)

## 2020-07-12 LAB — RENAL FUNCTION PANEL
Albumin: 2.2 g/dL — ABNORMAL LOW (ref 3.5–5.0)
Anion gap: 17 — ABNORMAL HIGH (ref 5–15)
BUN: 10 mg/dL (ref 8–23)
CO2: 15 mmol/L — ABNORMAL LOW (ref 22–32)
Calcium: 12.3 mg/dL — ABNORMAL HIGH (ref 8.9–10.3)
Chloride: 97 mmol/L — ABNORMAL LOW (ref 98–111)
Creatinine, Ser: 1.97 mg/dL — ABNORMAL HIGH (ref 0.44–1.00)
GFR calc Af Amer: 28 mL/min — ABNORMAL LOW (ref >60)
GFR calc non Af Amer: 24 mL/min — ABNORMAL LOW (ref >60)
Glucose, Bld: 174 mg/dL — ABNORMAL HIGH (ref 70–99)
Phosphorus: 5.7 mg/dL — ABNORMAL HIGH (ref 2.5–4.6)
Potassium: 4.5 mmol/L (ref 3.5–5.1)
Sodium: 129 mmol/L — ABNORMAL LOW (ref 135–145)

## 2020-07-12 LAB — CBC
HCT: 29.4 % — ABNORMAL LOW (ref 36.0–46.0)
Hemoglobin: 9.1 g/dL — ABNORMAL LOW (ref 12.0–15.0)
MCH: 30 pg (ref 26.0–34.0)
MCHC: 31 g/dL (ref 30.0–36.0)
MCV: 97 fL (ref 80.0–100.0)
Platelets: 24 10*3/uL — CL (ref 150–400)
RBC: 3.03 MIL/uL — ABNORMAL LOW (ref 3.87–5.11)
RDW: 16.3 % — ABNORMAL HIGH (ref 11.5–15.5)
WBC: 25.8 10*3/uL — ABNORMAL HIGH (ref 4.0–10.5)
nRBC: 44.3 % — ABNORMAL HIGH (ref 0.0–0.2)

## 2020-07-12 LAB — PROTIME-INR
INR: 3.4 — ABNORMAL HIGH (ref 0.8–1.2)
Prothrombin Time: 33.1 seconds — ABNORMAL HIGH (ref 11.4–15.2)

## 2020-07-12 LAB — MAGNESIUM: Magnesium: 2.6 mg/dL — ABNORMAL HIGH (ref 1.7–2.4)

## 2020-07-12 MED ORDER — SODIUM CHLORIDE 0.9 % IV SOLN
500.0000 mg | Freq: Once | INTRAVENOUS | Status: AC
  Administered 2020-07-12: 500 mg via INTRAVENOUS
  Filled 2020-07-12: qty 0.5

## 2020-07-12 MED ORDER — SODIUM CHLORIDE 0.9 % IV SOLN: INTRAVENOUS | Status: DC

## 2020-07-12 MED ORDER — SODIUM CHLORIDE 0.9 % IV SOLN
500.0000 mg | INTRAVENOUS | Status: DC
  Filled 2020-07-12: qty 0.5

## 2020-07-12 MED ORDER — VITAL AF 1.2 CAL PO LIQD
1000.0000 mL | ORAL | Status: DC
  Administered 2020-07-12: 1000 mL

## 2020-07-12 MED ORDER — DEXTROSE 50 % IV SOLN
INTRAVENOUS | Status: AC
  Filled 2020-07-12: qty 50

## 2020-07-12 MED ORDER — DEXTROSE 50 % IV SOLN
1.0000 | INTRAVENOUS | Status: AC
  Administered 2020-07-12: 50 mL via INTRAVENOUS
  Filled 2020-07-12: qty 50

## 2020-07-12 MED ORDER — DEXTROSE 50 % IV SOLN
INTRAVENOUS | Status: AC
  Administered 2020-07-12: 50 mL
  Filled 2020-07-12: qty 50

## 2020-07-12 NOTE — Progress Notes (Signed)
Nutrition Follow-up  DOCUMENTATION CODES:   Non-severe (moderate) malnutrition in context of chronic illness  INTERVENTION:   - If pt unable to tolerate tube feeds and advance to goal, recommend TPN as pt has been without adequate nutrition x 7 days  Restart trickle tube feeds via OG tube: - Vital AF 1.2 @ 20 ml/hr (480 ml/day)  Trickle tube feeding regimen provides 576 kcal, 36 grams of protein, and 389 ml of H2O (43% kcal needs, 40% protein needs).   Will monitor for tolerance and ability to advance to goal tube feeding regimen: - Vital AF 1.2 @ 45 ml/hr (1080 ml/day) - ProSource TF 45 ml daily  Goal tube feeding regimen would provide 1336 kcal, 92 grams of protein, and 876 ml of H2O.   NUTRITION DIAGNOSIS:   Moderate Malnutrition related to chronic illness (ESRD, cirrhosis) as evidenced by moderate fat depletion, moderate muscle depletion, severe muscle depletion.  Ongoing  GOAL:   Provide needs based on ASPEN/SCCM guidelines  Unmet at this time  MONITOR:   Vent status, Labs, Weight trends, TF tolerance, Skin, I & O's  REASON FOR ASSESSMENT:   Ventilator    ASSESSMENT:   74 year old female with past medical history of hypertension, hyperlipidemia, depression, anxiety, hepatocellular carcinoma (dx 02/2015 S/P thermoablation), ESRD on HD, alcoholic liver cirrhosis presents with severe abdominal pain. 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  7/17 - s/p MRCP 7/18 - ERCP unsuccessful 7/19 - s/p aborted laparoscopic cholecystectomy with placement of percutaneous cholecystostomy tube, surgery stopped early due to intraoperative hypotension and blood loss, s/p massive transfusion 7/20 - intubated, trickle TF initiated 7/21 - HD, s/p postmesenteric arteriogram and percutaneous coil embolization of the  pancreaticoduodenal artery and GDA and particle embolization of the right hepatic artery 7/22 - s/p bronch for mucus plugging, CRRT initiated 7/23 - cholangiogram showing persistent CBD obstruction 7/25 - emesis, OG tube placed to suction, CRRT d/c  Discussed with CCM and Surgery PA. Pt developed an ileus with N/V on 7/25. Plan is to restart trickle tube feeds today and assess for tolerance. If pt unable to tolerate tube feeds and advance to goal regimen, recommend TPN as pt has been without adequate nutrition since 7/19.  Discussed plan with RN.  Per Surgery note, pt will ultimately need biliary decompression but has been too unstable for a procedure.  Pt now off CRRT. Plan is to assess tomorrow for iHD vs CRRT based on hemodynamic state tomorrow.  EDW: 52.4 kg (admit weight). Pt with mild pitting edema to BUE and BLE.  Patient is currently intubated on ventilator support MV: 8.5 L/min Temp (24hrs), Avg:98 F (36.7 C), Min:95.5 F (35.3 C), Max:100.2 F (37.9 C) BP (a-line 1): 161/43 MAP (a-line 1): 64 BP (a-line 2): 141/33 MAP (a-line): 56  Drips: Levophed: 6.6 ml/hr D10: 75 ml/hr (provides 612 kcal daily)  Medications reviewed and include: aranesp, hectorol, protonix, thiamine, IV abx  Labs reviewed: sodium 129, phosphorus 5.7, magnesium 2.6, hemoglobin 9.1, platelets 24 CBG's: 40-189 x 24 hours  OG tube output: 310 ml x 24 hours Right lateral abdominal drain output: 100 ml x 24 hours Right medial abdominal JP drain output: 30 ml x 24 hours I/O's: +9.3 L since admit  Diet Order:   Diet Order            Diet NPO time specified  Diet effective now  EDUCATION NEEDS:   Not appropriate for education at this time  Skin:  Skin Assessment: Skin Integrity Issues: Incisions: closed abdomen  Last BM:  07/12/20 type 7 via rectal tube  Height:   Ht Readings from Last 1 Encounters:  07/08/20 5\' 3"  (1.6 m)    Weight:   Wt Readings from Last 1  Encounters:  07/12/20 63.9 kg    Ideal Body Weight:  52.3 kg  BMI:  Body mass index is 24.95 kg/m.  Estimated Nutritional Needs:   Kcal:  1335  Protein:  90-105 grams  Fluid:  1000 ml + UOP    Gaynell Face, MS, RD, LDN Inpatient Clinical Dietitian Please see AMiON for contact information.

## 2020-07-12 NOTE — Progress Notes (Signed)
PT Cancellation Note and Discharge  Patient Details Name: ANAMARI GALEAS MRN: [id number] DOB: 1946/05/20   Cancelled Treatment:    Reason Eval/Treat Not Completed: Medical issues which prohibited therapy. Acknowledged d/c of PT by MD. Please re-consult if med status improves and PT indicated.   Lorriane Shire 07/12/2020, 9:32 AM   Lorrin Goodell, PT  Office # [phone] Pager [phone]

## 2020-07-12 NOTE — Progress Notes (Signed)
NAME:  Sarah Harmon, MRN:  [id number], DOB:  25-Jan-1946, LOS: 9 ADMISSION DATE:  07/06/2020, CONSULTATION DATE:  06/29/2020 REFERRING MD: Kayleen Memos, DO CHIEF COMPLAINT:  Hemorrhagic shock   Brief History   PCCM consulted for admission to ICU after complicated cholecystectomy. Patient had Abnormal blood loss and needing massive transfusion.   Past Medical History  HTN HLD Depression Anxiety Hepatocellular carcinoma dx 2016 s/p themoablation ESRD on HD TTH S Alcoholic liver cirrhosis   Significant Hospital Events   7/17 Admit 7/17 MRCP 7/18 ERCP unsuccessful 7/19 Attempted laparoscopic cholecystectomy with placement of percutaneous cholecystostomy tube; aborted intraoperatively due to intra-op hypotension, bleeding and coagulopathy. As of 7/19 had received 7 u PRBC, 2 FFP and 1 plt. Still on high pressors.  7/20 intubated. Still in sig shock (in spite of more blood products (up to 9 units PRBC), three pressors and bicarb). Made DNR. Did not tol iHD. Plan to start CRRT.  7/21: Hgb cont to drop. 2 more PRBC.  CT abd/pelvis obtained. Reviewed by surgery: "CT are not unexpected and the patient's hemodynamics, vasopressor requirement, and response to blood transfusion do not suggest ongoing blood loss that would necessitate laparotomy. Recommend continued balanced resuscitation guided by TEG and maintenance of normothermia. Will continue to follow". Could not get HD cath to work for CRRT. Off pressors. Seems volume responsive. PLTs 51. Holding stable. INR 2.1. . Examination is positive for area of active arterial extravasation adjacent to the gallbladder fossa with associated pooling within the right upper abdomen likely supplied several branches of the anterior division of the right hepatic artery as well as potential supply from the pancreaticoduodenal branch of the GDA. Marland Kitchen  Having bloody stool went for CT angiogram underwent arteriogram with percutaneous coil embolization of the  pancreatic duodenal artery and GDA as well as embolization of the right hepatic artery. 7/22 hemodynamics improved starting to have episodes of hypoglycemia with glucose as low as 30 D10 infusion initiated still coagulopathic getting FFP.  Ventilator mechanics demonstrating concern for transfusion related lung injury, and protected tidal volume ventilation initiated.  CRRT reinitiated. 7/23: Off pressors.  Tolerating CRRT.  Interactive .  Both JP and cholecystostomy drain with serosanguineous drainage more bloody than serous.  Now pending transfer to radiology for cholangiogram through cholecystostomy tube.  Hemoglobin sitting at 7.1 down from 7.81-day ago 7/24: On CRRT.    Consults:  Nephrology GI Surgery IR  Procedures:  7/18 ERCP 7/19 Lap Chole  7/20 intubated for encephalopathy 7/20 trialysis catheter placement 7/21 IR embolization  Significant Diagnostic Tests:  7/17 MRCP :  Choledocholithiasis, with a 1.3 cm distal common duct stone. Gallbladder distension with mild pericholecystic edema. Findings are suspicious for acute cholecystitis. 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 Cirrhosis and segment 8 ablation defect, suboptimally evaluated without gross recurrent or metachronous hepatocellular carcinoma Hemosiderosis.Trace abdominal ascites. Similar upper pole left renal complex cystic lesion, incompletely 7/21 CT abd/pelvis: 1. Complex high attenuation fluid throughout the abdomen and pelvis consistent with hemoperitoneum.  unclear whether this represents acute hemorrhage, or residual from prior hemorrhage at the time of laparoscopy. Flattened inferior vena cava which may reflect hypovolemia. Postsurgical changes from recent laparoscopy, with punctate foci of free gas in the upper abdomen. Percutaneous drains within the right upper quadrant and along the dome of the liver.  Small bilateral pleural effusions. 7/21: 1. Examination is positive for area of active arterial extravasation adjacent to the gallbladder  fossa with associated pooling within the right upper abdomen likely supplied several branches of the anterior division of the right hepatic artery as well as potential supply from the pancreaticoduodenal branch of the GDA. 2. Multiple mixed attenuating fluid collections are seen throughout the abdomen and pelvis, nearly all of which contain hematocrit levels, compatible with of evolving hematomas. 3. Scattered atherosclerotic plaque within normal caliber abdominal aorta.4. Suspected approximately 50% luminal narrowing involving theorigin the celiac artery.  Micro Data:  7/18 blood>> negative 7/25 blood >>  Antimicrobials:  Zosyn 7/17 >> 7/25 Meropenem 7/25 >>   Interim history/subjective:  Remains on pressors, vent.  Objective   Blood pressure (!) 126/34, pulse 72, temperature 99 F (37.2 C), resp. rate (!) 26, height 5\' 3"  (1.6 m), weight 63.9 kg, SpO2 98 %. CVP:  [9 mmHg-15 mmHg] 12 mmHg  Vent Mode: PSV;CPAP FiO2 (%):  [30 %-40 %] 30 % Set Rate:  [20 bmp] 20 bmp Vt Set:  [360 mL] 360 mL PEEP:  [8 cmH20] 8 cmH20 Pressure Support:  [10 DTO67-12 cmH20] 10 cmH20 Plateau Pressure:  [22 cmH20] 22 cmH20   Intake/Output Summary (Last 24 hours) at 07/12/2020 0850 Last data filed at 07/12/2020 0800 Gross per 24 hour  Intake 1803.35 ml  Output 1039 ml  Net 764.35 ml   Filed Weights   07/10/20 0426 07/11/20 0500 07/12/20 0405  Weight: 71 kg 65.2 kg 63.9 kg    Examination:  General - somnolent Eyes - pupils reactive ENT - ETT in place Cardiac - regular rate/rhythm, no murmur Chest - scattered rhonchi Abdomen - wound dressing clean Extremities - 1+ edema Skin - no rashes Neuro - follows simple commands   Resolved Hospital Problem list   Shock: Multifactorial: Hemorrhagic and hypovolemic Lactic acidosis/anion gap metabolic acidosis  resolved  Assessment & Plan:   Choledocholithiasis with acute cholecystitis. - ERCP on 7/18 >> failed attempt at cholangiogram, no papillary orifice seen, distorted anatomy - attempted laparoscopy cholecystectomy 4/58 >> complicated by intraoperative hemorrhage with shock - ABx changed to meropenem 7/25 - surgery to coordinate with IR about placing drain - nutrition per surgery  Acute respiratory failure with hypoxia. - pressure support as able - not ready for extubation trial  Hemorrhagic/septic shock. - wean pressors to keep SBP > 90  Acute metabolic encephalopathy 2nd to sepsis. - RASS goal 0 to -1  Hx of ETOH cirrhosis, hepatocellular carcinoma. - f/u LFTs  ESRD. Hyponatremia, metabolic acidosis. - transitioned off CRRT 7/25 - plan for iHD 7/27  Anemia from blood loss, critical illness, and chronic disease. Thrombocytopenia. - likely from sepsis, hx of cirrhosis - f/u CBC - defer to surgery and IR if she needs PLT transfusion prior to any procedures  Hypoglycemia. - continue dextrose in IV fluid  Best practice:  Diet: NPO DVT prophylaxis: SCDs GI prophylaxis: pantoprazole Mobility: Bed rest Code Status: Full Disposition: ICU  Labs    CMP Latest Ref Rng & Units 07/12/2020 07/11/2020 07/11/2020  Glucose 70 - 99 mg/dL 174(H) 90 123(H)  BUN 8 - 23 mg/dL 10 5(L) <5(L)  Creatinine 0.44 - 1.00 mg/dL 1.97(H) 1.01(H) 1.01(H)  Sodium 135 - 145 mmol/L 129(L) 133(L) 134(L)  Potassium 3.5 - 5.1 mmol/L 4.5 4.4 3.9  Chloride 98 - 111 mmol/L 97(L) 100 99  CO2 22 - 32 mmol/L 15(L) 21(L) 20(L)  Calcium 8.9 - 10.3 mg/dL 12.3(H) 10.1 10.1  Total Protein 6.5 - 8.1 g/dL - - 7.0  Total Bilirubin 0.3 - 1.2 mg/dL - - 18.6(HH)  Alkaline Phos 38 - 126 U/L - - 260(H)  AST 15 - 41 U/L - - 826(H)  ALT 0 - 44 U/L - - 731(H)    CBC Latest Ref Rng & Units 07/12/2020 07/11/2020 07/10/2020  WBC 4.0 - 10.5 K/uL 25.8(H) 16.4(H) 9.1  Hemoglobin 12.0 - 15.0 g/dL 9.1(L) 9.5(L) 9.6(L)   Hematocrit 36 - 46 % 29.4(L) 29.0(L) 29.7(L)  Platelets 150 - 400 K/uL 24(LL) 58(L) 37(L)    ABG    Component Value Date/Time   PHART 7.439 07/09/2020 1229   PCO2ART 35.5 07/09/2020 1229   PO2ART 48 (L) 07/09/2020 1229   HCO3 24.4 07/09/2020 1229   TCO2 25 07/09/2020 1229   ACIDBASEDEF 8.0 (H) 06/25/2020 0356   O2SAT 88.0 07/09/2020 1229    CBG (last 3)  Recent Labs    07/12/20 0354 07/12/20 0443 07/12/20 0716  GLUCAP 60* 155* 88    Critical care time: 37 minutes   Chesley Mires, MD [place] Pager - [phone] - [phone] 07/12/2020, 9:03 AM

## 2020-07-12 NOTE — Progress Notes (Signed)
Patient ID: Sarah Harmon, female   DOB: [birth year], 74 y.o.   MRN: [id number] [facility] Progress Note   Assessment/ Plan:   1.  Acute cholecystitis with choledocholithiasis: Status post attempted laparoscopic cholecystectomy after unsuccessful ERCP complicated by bleeding from pancreaticoduodenal artery with percutaneous coil embolization by IR on 7/21.  With cholecystostomy tube to decompress gallbladder and plans noted for transhepatic percutaneous drain at some point-currently likely to be limited by her thrombocytopenia. 2. ESRD: She is usually on a TTS dialysis schedule and does not have any acute dialysis needs at this time after previously being on CRRT for hemodynamic instability.  I will decide on need for conventional dialysis versus CRRT based on hemodynamic state tomorrow. 3. Anemia: With anemia of end-stage renal disease complicated by acute blood loss anemia postoperatively.  Status post PRBC transfusions, monitor H&H as well as platelet trends.  Will give ESA Wednesday. 4. CKD-MBD: Rising phosphorus levels noted status post discontinuation of CRRT, she remains on tube feeds with vital supplement.  Continue to monitor with hemodialysis. 5. Nutrition: Ongoing tube feeds.  Continue to monitor albumin/prealbumin. 6. Hypotension: Weaning pressors at this time. 7.  Hyponatremia: Secondary to hypotonic fluid supplementation with D5W at 75 cc an hour.  Started on normal saline at 50 cc an hour by critical care-we will discontinue the latter after 12 hours.  Subjective:   With persistent hypoglycemia overnight on D5W drip.  On low-dose Levophed.   Objective:   BP (!) 126/34   Pulse 72   Temp 99 F (37.2 C)   Resp (!) 26   Ht '5\' 3"'$  (1.6 m)   Wt 63.9 kg   SpO2 98%   BMI 24.95 kg/m   Physical Exam: Gen: Intubated, sedated, appears comfortable. CVS: Pulse regular rhythm, normal rate, S1 and S2 normal Resp: Clear to auscultation bilaterally, no distinct rales.   Right IJ temporary dialysis catheter. Abd: Soft, flat, right upper quadrant drain with hemorrhagic fluid. Ext: No lower extremity edema.  Left upper arm AV graft pulsatile  Labs: BMET Recent Labs  Lab 07/09/20 0452 07/09/20 0452 07/09/20 1229 07/09/20 1600 07/10/20 0513 07/10/20 1510 07/11/20 0445 07/11/20 1517 07/12/20 0449  NA 135   < > 137 135 134* 134* 134* 133* 129*  K 3.7   < > 3.9 3.8 3.7 3.7 3.9 4.4 4.5  CL 97*  --   --  98 98 98 99 100 97*  CO2 23  --   --  22 24 20* 20* 21* 15*  GLUCOSE 144*  --   --  132* 120* 141* 123* 90 174*  BUN 9  --   --  9 7* 7* <5* 5* 10  CREATININE 1.97*  --   --  1.75* 1.23* 1.01* 1.01* 1.01* 1.97*  CALCIUM 9.9  --   --  10.0 9.5 9.9 10.1 10.1 12.3*  PHOS 3.9  --   --  3.4 2.2* 2.3* 2.7 3.4 5.7*   < > = values in this interval not displayed.   CBC Recent Labs  Lab 06/21/2020 2034 07/12/2020 2057 07/08/20 2342 07/09/20 0058 07/10/20 0513 07/10/20 1510 07/11/20 0445 07/12/20 0449  WBC 9.8   < > 14.8*   < > 11.6* 9.1 16.4* 25.8*  NEUTROABS 8.3*  --  13.7*  --   --  10.3* 13.9*  --   HGB 10.6*   < > 7.8*   < > 7.8* 9.6* 9.5* 9.1*  HCT 32.9*   < > 24.7*   < >  24.7* 29.7* 29.0* 29.4*  MCV 93.2   < > 91.1   < > 91.5 92.5 93.2 97.0  PLT 79*   < > 86*   < > 46* 37* 58* 24*   < > = values in this interval not displayed.     Medications:    . B-complex with vitamin C  1 tablet Per Tube Daily  . chlorhexidine  15 mL Mouth/Throat Once  . chlorhexidine gluconate (MEDLINE KIT)  15 mL Mouth Rinse BID  . Chlorhexidine Gluconate Cloth  6 each Topical Q0600  . darbepoetin (ARANESP) injection - DIALYSIS  100 mcg Intravenous Q Wed-HD  . docusate  100 mg Per Tube BID  . [START ON 08/02/20] doxercalciferol  7 mcg Intravenous Once per day on Tue Thu Sat  . ketotifen  1 drop Both Eyes BID  . lactulose  10 g Per Tube TID  . mouth rinse  15 mL Mouth Rinse 10 times per day  . pantoprazole  40 mg Intravenous Q12H  . phytonadione  2.5 mg Per Tube  Daily  . polyvinyl alcohol  1 drop Both Eyes Daily  . sodium chloride flush  10-40 mL Intracatheter Q12H  . thiamine  100 mg Per Tube Daily   Elmarie Shiley, MD 07/12/2020, 8:43 AM

## 2020-07-12 NOTE — Progress Notes (Signed)
Patient transported to MRI. While in the MRI machine patient's O2 saturation was picking up low and occasionally not picking up at all. MRI tech swapped out pulse ox multiple times. Patient was pulled out of the scanner and her color appeared somewhat pale. Staff was not comfortable continuing with scan. Patient was then connected back to her regular monitor and O2 saturation read 91.

## 2020-07-12 NOTE — Progress Notes (Signed)
[facility] Progress Note Patient Name: ZAFIRO ROUTSON DOB: February 20, 1946 MRN: [id number]   Date of Service  07/12/2020  HPI/Events of Note  Hypotension - Blood glucose = 60.   eICU Interventions  Plan: 1. Increase D50 IV infusion to 75 mL/hour.      Intervention Category Major Interventions: Other:  Jaylen Claude Cornelia Copa 07/12/2020, 4:28 AM

## 2020-07-12 NOTE — Progress Notes (Signed)
[facility] Progress Note Patient Name: Sarah Harmon DOB: 1946-05-31 MRN: [id number]   Date of Service  07/12/2020  HPI/Events of Note  Hyponatremia - Sodium = 129.   eICU Interventions  Plan: 1. 0.9 NaCl IV infusion to run at 50 mL/hour.      Intervention Category Major Interventions: Electrolyte abnormality - evaluation and management  [place] Cawood [place] 07/12/2020, 6:08 AM

## 2020-07-12 NOTE — Progress Notes (Signed)
Pharmacy Antibiotic Note  Sarah Harmon is a 74 y.o. female admitted on 06/17/2020 with cholelithiasis s/p complex cholecystectomy with continued CBD obstruction.  Pharmacy has been consulted for meropenem dosing.  Now off CRRT and Renal evaluating for next iHD session.  Afebrile, WBC elevated at 25.8.Marland Kitchen  Plan: Merrem 500mg  IV Q24H F/u HD schedule/tolerance, clinical progress   Height: 5\' 3"  (160 cm) Weight: 63.9 kg (140 lb 14 oz) IBW/kg (Calculated) : 52.4  Temp (24hrs), Avg:97.8 F (36.6 C), Min:95.4 F (35.2 C), Max:100.2 F (37.9 C)  Recent Labs  Lab 06/22/2020 2034 07/16/2020 2037 07/03/2020 0519 07/02/2020 1253 07/09/20 0630 07/09/20 1600 07/10/20 0513 07/10/20 1510 07/11/20 0445 07/11/20 1517 07/12/20 0449  WBC   < >  --  6.8   < > 13.7*  --  11.6* 9.1 16.4*  --  25.8*  CREATININE   < >  --  6.30*   < >  --    < > 1.23* 1.01* 1.01* 1.01* 1.97*  LATICACIDVEN  --  7.9* >11.0*  --   --   --   --   --   --   --   --    < > = values in this interval not displayed.    Estimated Creatinine Clearance: 22.2 mL/min (A) (by C-G formula based on SCr of 1.97 mg/dL (H)).    No Known Allergies  Zosyn 7/17 >> 7/25 Merrem 7/25>> Vanc 7/25>>7/26  7/18 BCx - negative 7/18 MRSA PCR - negative 7/25 BCx -   Nikesh Teschner D. Mina Marble, PharmD, BCPS, [place] 07/12/2020, 9:36 AM

## 2020-07-12 NOTE — Progress Notes (Signed)
Pt. Transported to MRI per RN, Respiratory, and transport team. During MRI procedure pt. O2 saturation was reading low saturation. MRI tech switched O2 probes multiple times and could not get a good reading. Pt. Was removed from scanner and color change was noticeable. MRI staff stated they were not comfortable continuing with the procedure.   RN and RT connected pt. Back to portable monitor and transported pt. Back upstairs. O2 sat was 68 at bedside and Halford Chessman, MD was notified. Sood, MD came to unit to look at patient while O2 saturation was rising. MD aware of pt. Changes.

## 2020-07-12 NOTE — Progress Notes (Signed)
7 Days Post-Op   Subjective/Chief Complaint: Remains intubated Back on levo 6 mcg for MAPs in the 50's CRRT stopped yesterday Minimally responsive  INR 3.4  Objective: Vital signs in last 24 hours: Temp:  [95.4 F (35.2 C)-100.2 F (37.9 C)] 99.1 F (37.3 C) (07/26 0700) Pulse Rate:  [71-105] 77 (07/26 0700) Resp:  [21-33] 29 (07/26 0700) BP: (146-181)/(38-49) 154/41 (07/26 0346) SpO2:  [98 %-100 %] 98 % (07/26 0700) Arterial Line BP: (112-203)/(38-55) 112/41 (07/26 0700) FiO2 (%):  [30 %-40 %] 30 % (07/26 0346) Weight:  [63.9 kg] 63.9 kg (07/26 0405) Last BM Date: 07/12/20  Intake/Output from previous day: 07/25 0701 - 07/26 0700 In: 1880 [I.V.:1419.7; NG/GT:180; IV Piggyback:280.3] Out: 1088 [Emesis/NG output:310; Drains:130] Intake/Output this shift: No intake/output data recorded.  Exam: Intubated, minimally responsive this morning Abdomen full, JP and drain in gallbladder with bloody fluid, some bile in drain ` R JP: 100 cc/24h SS  R medial JP: 30 cc/24h SS  NG: 310 cc/24h bilious  Rectal tube in place with liquid stool in pouch Lab Results:  Recent Labs    07/11/20 0445 07/12/20 0449  WBC 16.4* 25.8*  HGB 9.5* 9.1*  HCT 29.0* 29.4*  PLT 58* 24*   BMET Recent Labs    07/11/20 1517 07/12/20 0449  NA 133* 129*  K 4.4 4.5  CL 100 97*  CO2 21* 15*  GLUCOSE 90 174*  BUN 5* 10  CREATININE 1.01* 1.97*  CALCIUM 10.1 12.3*   PT/INR Recent Labs    07/11/20 0445 07/12/20 0449  LABPROT 25.3* 33.1*  INR 2.4* 3.4*   ABG Recent Labs    07/09/20 1229  PHART 7.439  HCO3 24.4    Studies/Results: DG Abd 1 View  Result Date: 07/11/2020 CLINICAL DATA:  Evaluate for ileus. EXAM: ABDOMEN - 1 VIEW COMPARISON:  07/14/2020 FINDINGS: Nasogastric tube unchanged with tip over the stomach in the left upper quadrant. Catheter over the right abdomen with tip over the right upper quadrant unchanged. Bowel gas pattern is nonobstructive. No free peritoneal air.  Multiple embolization coils over the right upper quadrant. Surgical skin staples over the right upper quadrant and right lower quadrant. Remainder the exam is unremarkable. IMPRESSION: 1.  Nonobstructive bowel gas pattern. 2.  Tubes and lines as described. Electronically Signed   By: Marin Olp M.D.   On: 07/11/2020 12:55   CT HEAD WO CONTRAST  Result Date: 07/11/2020 CLINICAL DATA:  Acute neuro deficit, possible stroke EXAM: CT HEAD WITHOUT CONTRAST TECHNIQUE: Contiguous axial images were obtained from the base of the skull through the vertex without intravenous contrast. COMPARISON:  09/10/17 FINDINGS: Brain: Mild atrophic changes are noted. No findings to suggest acute hemorrhage, acute infarction or space-occupying mass lesion are noted. Falx calcification and basal ganglia calcifications are noted. Vascular: No hyperdense vessel or unexpected calcification. Skull: Normal. Negative for fracture or focal lesion. Sinuses/Orbits: No acute finding. Other: None. IMPRESSION: Chronic atrophic changes without acute abnormality. Electronically Signed   By: Inez Catalina M.D.   On: 07/11/2020 21:43   DG CHEST PORT 1 VIEW  Result Date: 07/11/2020 CLINICAL DATA:  Aspiration. EXAM: PORTABLE CHEST 1 VIEW COMPARISON:  07/10/2020 FINDINGS: Patient slightly rotated to the left. Endotracheal tube has tip 4.8 cm above the carina. Right IJ central venous catheter unchanged. Enteric tube courses into the region of the stomach and off the film as tip is not visualized. Left IJ unchanged with tip over left brachiocephalic vein stent. Lungs are adequately inflated demonstrate  mild hazy prominence of the right perihilar vessels slightly improved. No effusion or pneumothorax. Cardiomediastinal silhouette and remainder of the exam is unchanged. IMPRESSION: 1. Interval improvement and hazy prominence of the perihilar vessels suggesting improving vascular congestion. 2.  Multiple stable tubes and lines as described. Electronically  Signed   By: Marin Olp M.D.   On: 07/11/2020 08:12    Anti-infectives: Anti-infectives (From admission, onward)   Start     Dose/Rate Route Frequency Ordered Stop   2020/07/15 1800  vancomycin (VANCOREADY) IVPB 750 mg/150 mL     Discontinue     750 mg 150 mL/hr over 60 Minutes Intravenous Every T-Th-Sa (1800) 07/11/20 2201     07/12/20 1300  meropenem (MERREM) 1 g in sodium chloride 0.9 % 100 mL IVPB     Discontinue     1 g 200 mL/hr over 30 Minutes Intravenous Every 24 hours 07/11/20 2201     07/11/20 1400  meropenem (MERREM) 1 g in sodium chloride 0.9 % 100 mL IVPB  Status:  Discontinued        1 g 200 mL/hr over 30 Minutes Intravenous Every 8 hours 07/11/20 1115 07/11/20 2201   07/11/20 1400  vancomycin (VANCOREADY) IVPB 750 mg/150 mL  Status:  Discontinued        750 mg 150 mL/hr over 60 Minutes Intravenous Every 24 hours 07/11/20 1115 07/11/20 2201   07/08/20 1800  piperacillin-tazobactam (ZOSYN) IVPB 3.375 g  Status:  Discontinued        3.375 g 100 mL/hr over 30 Minutes Intravenous Every 6 hours 07/08/20 1045 07/11/20 1101   07/07/20 2200  piperacillin-tazobactam (ZOSYN) IVPB 3.375 g  Status:  Discontinued        3.375 g 100 mL/hr over 30 Minutes Intravenous Every 12 hours 07/07/20 0841 07/08/20 1045   07/07/2020 1300  piperacillin-tazobactam (ZOSYN) IVPB 2.25 g  Status:  Discontinued        2.25 g 100 mL/hr over 30 Minutes Intravenous Every 8 hours 07/07/2020 0835 07/04/2020 1120   06/27/2020 1300  piperacillin-tazobactam (ZOSYN) IVPB 3.375 g  Status:  Discontinued        3.375 g 100 mL/hr over 30 Minutes Intravenous Every 6 hours 06/28/2020 1120 07/07/20 0841   06/27/2020 1200  piperacillin-tazobactam (ZOSYN) IVPB 3.375 g  Status:  Discontinued        3.375 g 100 mL/hr over 30 Minutes Intravenous Every 6 hours 07/12/2020 0652 06/28/2020 0835   07/15/2020 0430  piperacillin-tazobactam (ZOSYN) IVPB 2.25 g  Status:  Discontinued        2.25 g 100 mL/hr over 30 Minutes Intravenous Every 8  hours 07/15/2020 2147 06/19/2020 0652   06/30/2020 2000  piperacillin-tazobactam (ZOSYN) IVPB 3.375 g        3.375 g 12.5 mL/hr over 240 Minutes Intravenous  Once 07/07/2020 1957 06/22/2020 2328      Assessment/Plan: HTN HLD Depression Anxiety Hepatocellular carcinoma Dx 2016 s/p thermoablation ESRD on 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 Georgette Dover 7/21 IR embolization of right hepatic artery  - remains intubated with intermittent pressor requirements, CRRT stopped yesterday  INR 3.4, plts 24. CMP yesterday w/ bili 18.6, AST 826, SLT 731, AP 260  Remains critically ill and unstable. Ultimately needs biliary decompression but has been too unstable for a procedure. Hopefully IR will be able place a perc drain transhepatically this week to decompress the CBD.  She is  not a candidate for surgery and a common bile duct exploration in her condition.   LOS: 9 days    Jill Alexanders 07/12/2020

## 2020-07-12 NOTE — Progress Notes (Addendum)
Referring Physician(s):  [facility]- Dr Halford Chessman Dr Jonny Ruiz   Supervising Physician: Corrie Mckusick  Patient Status:  [facility] - [place] - In-pt   Choledocholithiasis  Subjective:  IR procedure 07/07/20: IMPRESSION: 1. Technically successful percutaneous coil embolization of the pancreaticoduodenal and gastroduodenal arteries felt to contribute to paired to rise parasitized abnormal flow to the gallbladder fossa. 2. Technically successful prophylactic percutaneous particle embolization of the anterior division of the right hepatic artery as well as the right hepatic artery due to abnormal vascularity about the gallbladder fossa.  IR procedure 07/09/20:  IMPRESSION: 1. Obstructing calculus in the proximal CBD. 2. Cholelithiasis 3. Cystic duct is patent and communicates with the intrahepatic biliary tree.  Intubated No response Drains intact   Allergies: Patient has no known allergies.  Medications: Prior to Admission medications   Medication Sig Start Date End Date Taking? Authorizing Provider  Acetaminophen (TYLENOL PO) Take 1 tablet by mouth 3 (three) times a week.   Yes [provider]  ALPRAZolam (XANAX) 0.5 MG tablet TAKE 1 TABLET BY MOUTH ONCE DAILY AT BEDTIME Patient taking differently: Take 0.5 mg by mouth at bedtime.  06/23/20  Yes Biagio Borg, MD  amLODipine (NORVASC) 10 MG tablet Take 1 tablet by mouth once daily Patient taking differently: Take 10 mg by mouth daily.  06/04/20  Yes Biagio Borg, MD  B Complex-C-Zn-Folic Acid (DIALYVITE 263 WITH ZINC) 0.8 MG TABS Take 1 tablet by mouth daily. 09/29/18  Yes [provider]  CONSTULOSE 10 GM/15ML solution TAKE 30 ML BY MOUTH ONCE DAILY Patient taking differently: Take 20 g by mouth daily.  05/19/20  Yes Biagio Borg, MD  folic acid (FOLVITE) 1 MG tablet Take 1 tablet by mouth once daily Patient taking differently: Take 1 mg by mouth daily.  01/26/20  Yes Biagio Borg, MD  hydrALAZINE (APRESOLINE) 100 MG tablet TAKE  1 TABLET BY MOUTH THREE TIMES DAILY Patient taking differently: Take 100 mg by mouth 3 (three) times daily.  01/19/20  Yes Biagio Borg, MD  ketotifen (ZADITOR) 0.025 % ophthalmic solution Place 1 drop into both eyes 2 (two) times daily.   Yes [provider]  lidocaine-prilocaine (EMLA) cream Apply 1 application topically daily as needed (use before dialysis).  10/11/18  Yes [provider]  propranolol (INDERAL) 40 MG tablet Take 1 tablet by mouth twice daily Patient taking differently: Take 40 mg by mouth 2 (two) times daily.  04/06/20  Yes Biagio Borg, MD  Propylene Glycol (SYSTANE BALANCE) 0.6 % SOLN Place 1 drop into both eyes daily.   Yes [provider]  Darbepoetin Alfa (ARANESP) 100 MCG/0.5ML SOSY injection Inject 0.5 mLs (100 mcg total) into the vein every Thursday with hemodialysis. 07/25/18   Cherene Altes, MD  Methoxy PEG-Epoetin Beta (MIRCERA IJ) Mircera 04/06/20 04/05/21  [provider]     Vital Signs: BP (!) 126/34   Pulse 72   Temp 99 F (37.2 C)   Resp (!) 26   Ht '5\' 3"'$  (1.6 m)   Wt 140 lb 14 oz (63.9 kg)   SpO2 98%   BMI 24.95 kg/m   Physical Exam Skin:    General: Skin is warm.     Comments: Sites of drain are clean and dry No bleeding No hematoma      Imaging: DG Abd 1 View  Result Date: 07/11/2020 CLINICAL DATA:  Evaluate for ileus. EXAM: ABDOMEN - 1 VIEW COMPARISON:  06/22/2020 FINDINGS: Nasogastric tube unchanged with  tip over the stomach in the left upper quadrant. Catheter over the right abdomen with tip over the right upper quadrant unchanged. Bowel gas pattern is nonobstructive. No free peritoneal air. Multiple embolization coils over the right upper quadrant. Surgical skin staples over the right upper quadrant and right lower quadrant. Remainder the exam is unremarkable. IMPRESSION: 1.  Nonobstructive bowel gas pattern. 2.  Tubes and lines as described. Electronically Signed   By: Elberta Fortis M.D.   On:  07/11/2020 12:55   CT HEAD WO CONTRAST  Result Date: 07/11/2020 CLINICAL DATA:  Acute neuro deficit, possible stroke EXAM: CT HEAD WITHOUT CONTRAST TECHNIQUE: Contiguous axial images were obtained from the base of the skull through the vertex without intravenous contrast. COMPARISON:  09/10/17 FINDINGS: Brain: Mild atrophic changes are noted. No findings to suggest acute hemorrhage, acute infarction or space-occupying mass lesion are noted. Falx calcification and basal ganglia calcifications are noted. Vascular: No hyperdense vessel or unexpected calcification. Skull: Normal. Negative for fracture or focal lesion. Sinuses/Orbits: No acute finding. Other: None. IMPRESSION: Chronic atrophic changes without acute abnormality. Electronically Signed   By: Alcide Clever M.D.   On: 07/11/2020 21:43   IR Angiogram Visceral Selective  Result Date: 07/08/2020 INDICATION: History of cirrhosis and end-stage renal disease, currently on dialysis now with attempted though non successful cholecystectomy with postoperative bleeding found to have active extravasation about the gallbladder fossa. Please perform mesenteric arteriogram and percutaneous embolization as indicated. EXAM: 1. ULTRASOUND GUIDANCE FOR ARTERIAL ACCESS 2. SELECTIVE CELIAC ARTERIOGRAM 3. SELECTIVE PANCREATICODUODENAL ARTERIOGRAM AND PERCUTANEOUS COIL EMBOLIZATION 4. SELECTIVE GASTRODUODENAL ARTERIOGRAM AND PERCUTANEOUS COIL EMBOLIZATION 5. SELECTIVE PROPER HEPATIC ARTERIOGRAM 6. SELECTIVE ARTERIOGRAM OF ANTERIOR DIVISION OF THE RIGHT HEPATIC ARTERY AND PERCUTANEOUS PARTICLE EMBOLIZATION 7. SELECTIVE ARTERIOGRAM OF THE RIGHT HEPATIC ARTERY AND PERCUTANEOUS PARTICLE EMBOLIZATION COMPARISON:  CT abdomen and pelvis-earlier same day MEDICATIONS: None ANESTHESIA/SEDATION: Moderate (conscious) sedation was employed during this procedure. A total of Versed 4 mg and Fentanyl 200 mcg was administered intravenously. Moderate Sedation Time: 72 minutes. The patient's  level of consciousness and vital signs were monitored continuously by radiology nursing throughout the procedure under my direct supervision. CONTRAST:  75 cc Omnipaque 300 FLUOROSCOPY TIME:  20 minutes, 30 seconds (1495 mGy) COMPLICATIONS: None immediate. PROCEDURE: Informed consent was obtained from the patient's daughter following explanation of the procedure, risks, benefits and alternatives. All questions were addressed. A time out was performed prior to the initiation of the procedure. Maximal barrier sterile technique utilized including caps, mask, sterile gowns, sterile gloves, large sterile drape, hand hygiene, and Betadine prep. The right femoral head was marked fluoroscopically. Under sterile conditions and local anesthesia, the right common femoral artery access was performed with a micropuncture needle. Under direct ultrasound guidance, the right common femoral was accessed with a micropuncture kit. An ultrasound image was saved for documentation purposes. This allowed for placement of a 5-French vascular sheath. A limited arteriogram was performed through the side arm of the sheath confirming appropriate access within the right common femoral artery. Over a Bentson wire, a Mickelson catheter was advanced the caudal aspect of the thoracic aorta where was reformed, back bled and flushed. The Mickelson catheter was then utilized to select the celiac artery and a selective celiac arteriogram was performed. Next, with the use of a fathom 14 microwire, a STC microcatheter was advanced to the level of the gastroduodenal artery and a gastroduodenal arteriogram was performed. The microcatheter was then utilized to select the pancreaticoduodenal artery and a selective pancreaticoduodenal arteriogram performed.  The peripheral aspect of the pancreaticoduodenal artery was then percutaneously coil embolized with multiple overlapping 3 mm and 4 mm interlock coils. Post embolization arteriogram was performed from  level of the GDA. The microcatheter was then retracted to the level of the gastroduodenal artery and the gastroduodenal artery was embolized to near its origin with multiple overlapping 5 mm and 6 mm interlock coils. The Microcatheter was drawn to the level of the common hepatic artery and a selective common hepatic arteriogram was performed. The microcatheter was removed and a celiac arteriogram was performed through the [facility] catheter. Next, with the use of a fathom 14 microwire, a high-flow microcatheter was advanced to the level of the proper hepatic artery and a proper hepatic arteriogram was performed. The microcatheter was utilized to select the anterior division of the right hepatic artery and a selective arteriogram was performed. Next, the anterior division of the right hepatic artery was percutaneously embolized with approximately 4/5 of one vial of 500-700 Embospheres. Post embolization arteriogram was performed. Next, the remainder of the right hepatic artery was particle embolized with the remaining 1 and 1/5 vials of 500-700 Embospheres. The microcatheter was retracted to the level of the common hepatic artery and completion arteriograms were performed. Images were reviewed and the procedure was terminated. All wires and catheters were removed from the patient. Given patient's elevated INR (greater than 4), the vascular sheath was secured at the right groin access site within interrupted suture and connected to a pressure bag. The patient tolerated the procedure well without immediate post procedural complication. FINDINGS: Selective celiac arteriogram demonstrates conventional anatomy. Note is again made of parasitized vascularity from multiple divisions of the right hepatic artery as well as from a hypertrophy pancreaticoduodenal artery as was suggested on preceding CTA. Additionally, there is early AV shunting about right lobe of the liver likely secondary to patient's background of cirrhosis.  Previously identified area of active arterial extravasation demonstrated on preceding abdominal CT is not definitely angiographically. Selective pancreaticoduodenal arteriogram demonstrates apparent parasitized vascularity to the gallbladder fossa, again with abnormal AV shunting. Following percutaneous coil embolization of both the pancreaticoduodenal and gastroduodenal arteries, there is complete stasis of flow through this vascular distribution. Selective arteriograms of the anterior division of the right hepatic artery as well as the main right hepatic artery demonstrates abnormal vascularity and parasitized flow about the gallbladder fossa. Following percutaneous particle embolization, there is marked reduction in flow and pruning of the peripheral vasculature throughout the right hepatic arterial vascular distribution. IMPRESSION: 1. Technically successful percutaneous coil embolization of the pancreaticoduodenal and gastroduodenal arteries felt to contribute to paired to rise parasitized abnormal flow to the gallbladder fossa. 2. Technically successful prophylactic percutaneous particle embolization of the anterior division of the right hepatic artery as well as the right hepatic artery due to abnormal vascularity about the gallbladder fossa. PLAN: - Continue resuscitative measures as per the ICU staff. - The patient is to remain flat while vascular sheath remains in place. - Plan to remove vascular sheath once INR below 1.5 (ideally). Electronically Signed   By: Simonne Come M.D.   On: 07/08/2020 09:55   IR Angiogram Selective Each Additional Vessel  Result Date: 07/08/2020 INDICATION: History of cirrhosis and end-stage renal disease, currently on dialysis now with attempted though non successful cholecystectomy with postoperative bleeding found to have active extravasation about the gallbladder fossa. Please perform mesenteric arteriogram and percutaneous embolization as indicated. EXAM: 1. ULTRASOUND  GUIDANCE FOR ARTERIAL ACCESS 2. SELECTIVE CELIAC ARTERIOGRAM 3. SELECTIVE PANCREATICODUODENAL  ARTERIOGRAM AND PERCUTANEOUS COIL EMBOLIZATION 4. SELECTIVE GASTRODUODENAL ARTERIOGRAM AND PERCUTANEOUS COIL EMBOLIZATION 5. SELECTIVE PROPER HEPATIC ARTERIOGRAM 6. SELECTIVE ARTERIOGRAM OF ANTERIOR DIVISION OF THE RIGHT HEPATIC ARTERY AND PERCUTANEOUS PARTICLE EMBOLIZATION 7. SELECTIVE ARTERIOGRAM OF THE RIGHT HEPATIC ARTERY AND PERCUTANEOUS PARTICLE EMBOLIZATION COMPARISON:  CT abdomen and pelvis-earlier same day MEDICATIONS: None ANESTHESIA/SEDATION: Moderate (conscious) sedation was employed during this procedure. A total of Versed 4 mg and Fentanyl 200 mcg was administered intravenously. Moderate Sedation Time: 72 minutes. The patient's level of consciousness and vital signs were monitored continuously by radiology nursing throughout the procedure under my direct supervision. CONTRAST:  75 cc Omnipaque 300 FLUOROSCOPY TIME:  20 minutes, 30 seconds (1610 mGy) COMPLICATIONS: None immediate. PROCEDURE: Informed consent was obtained from the patient's daughter following explanation of the procedure, risks, benefits and alternatives. All questions were addressed. A time out was performed prior to the initiation of the procedure. Maximal barrier sterile technique utilized including caps, mask, sterile gowns, sterile gloves, large sterile drape, hand hygiene, and Betadine prep. The right femoral head was marked fluoroscopically. Under sterile conditions and local anesthesia, the right common femoral artery access was performed with a micropuncture needle. Under direct ultrasound guidance, the right common femoral was accessed with a micropuncture kit. An ultrasound image was saved for documentation purposes. This allowed for placement of a 5-French vascular sheath. A limited arteriogram was performed through the side arm of the sheath confirming appropriate access within the right common femoral artery. Over a Bentson wire, a  Mickelson catheter was advanced the caudal aspect of the thoracic aorta where was reformed, back bled and flushed. The Mickelson catheter was then utilized to select the celiac artery and a selective celiac arteriogram was performed. Next, with the use of a fathom 14 microwire, a STC microcatheter was advanced to the level of the gastroduodenal artery and a gastroduodenal arteriogram was performed. The microcatheter was then utilized to select the pancreaticoduodenal artery and a selective pancreaticoduodenal arteriogram performed. The peripheral aspect of the pancreaticoduodenal artery was then percutaneously coil embolized with multiple overlapping 3 mm and 4 mm interlock coils. Post embolization arteriogram was performed from level of the GDA. The microcatheter was then retracted to the level of the gastroduodenal artery and the gastroduodenal artery was embolized to near its origin with multiple overlapping 5 mm and 6 mm interlock coils. The Microcatheter was drawn to the level of the common hepatic artery and a selective common hepatic arteriogram was performed. The microcatheter was removed and a celiac arteriogram was performed through the [facility] catheter. Next, with the use of a fathom 14 microwire, a high-flow microcatheter was advanced to the level of the proper hepatic artery and a proper hepatic arteriogram was performed. The microcatheter was utilized to select the anterior division of the right hepatic artery and a selective arteriogram was performed. Next, the anterior division of the right hepatic artery was percutaneously embolized with approximately 4/5 of one vial of 500-700 Embospheres. Post embolization arteriogram was performed. Next, the remainder of the right hepatic artery was particle embolized with the remaining 1 and 1/5 vials of 500-700 Embospheres. The microcatheter was retracted to the level of the common hepatic artery and completion arteriograms were performed. Images were  reviewed and the procedure was terminated. All wires and catheters were removed from the patient. Given patient's elevated INR (greater than 4), the vascular sheath was secured at the right groin access site within interrupted suture and connected to a pressure bag. The patient tolerated the procedure well without  immediate post procedural complication. FINDINGS: Selective celiac arteriogram demonstrates conventional anatomy. Note is again made of parasitized vascularity from multiple divisions of the right hepatic artery as well as from a hypertrophy pancreaticoduodenal artery as was suggested on preceding CTA. Additionally, there is early AV shunting about right lobe of the liver likely secondary to patient's background of cirrhosis. Previously identified area of active arterial extravasation demonstrated on preceding abdominal CT is not definitely angiographically. Selective pancreaticoduodenal arteriogram demonstrates apparent parasitized vascularity to the gallbladder fossa, again with abnormal AV shunting. Following percutaneous coil embolization of both the pancreaticoduodenal and gastroduodenal arteries, there is complete stasis of flow through this vascular distribution. Selective arteriograms of the anterior division of the right hepatic artery as well as the main right hepatic artery demonstrates abnormal vascularity and parasitized flow about the gallbladder fossa. Following percutaneous particle embolization, there is marked reduction in flow and pruning of the peripheral vasculature throughout the right hepatic arterial vascular distribution. IMPRESSION: 1. Technically successful percutaneous coil embolization of the pancreaticoduodenal and gastroduodenal arteries felt to contribute to paired to rise parasitized abnormal flow to the gallbladder fossa. 2. Technically successful prophylactic percutaneous particle embolization of the anterior division of the right hepatic artery as well as the right  hepatic artery due to abnormal vascularity about the gallbladder fossa. PLAN: - Continue resuscitative measures as per the ICU staff. - The patient is to remain flat while vascular sheath remains in place. - Plan to remove vascular sheath once INR below 1.5 (ideally). Electronically Signed   By: Sandi Mariscal M.D.   On: 07/08/2020 09:55   IR Angiogram Selective Each Additional Vessel  Result Date: 07/08/2020 INDICATION: History of cirrhosis and end-stage renal disease, currently on dialysis now with attempted though non successful cholecystectomy with postoperative bleeding found to have active extravasation about the gallbladder fossa. Please perform mesenteric arteriogram and percutaneous embolization as indicated. EXAM: 1. ULTRASOUND GUIDANCE FOR ARTERIAL ACCESS 2. SELECTIVE CELIAC ARTERIOGRAM 3. SELECTIVE PANCREATICODUODENAL ARTERIOGRAM AND PERCUTANEOUS COIL EMBOLIZATION 4. SELECTIVE GASTRODUODENAL ARTERIOGRAM AND PERCUTANEOUS COIL EMBOLIZATION 5. SELECTIVE PROPER HEPATIC ARTERIOGRAM 6. SELECTIVE ARTERIOGRAM OF ANTERIOR DIVISION OF THE RIGHT HEPATIC ARTERY AND PERCUTANEOUS PARTICLE EMBOLIZATION 7. SELECTIVE ARTERIOGRAM OF THE RIGHT HEPATIC ARTERY AND PERCUTANEOUS PARTICLE EMBOLIZATION COMPARISON:  CT abdomen and pelvis-earlier same day MEDICATIONS: None ANESTHESIA/SEDATION: Moderate (conscious) sedation was employed during this procedure. A total of Versed 4 mg and Fentanyl 200 mcg was administered intravenously. Moderate Sedation Time: 72 minutes. The patient's level of consciousness and vital signs were monitored continuously by radiology nursing throughout the procedure under my direct supervision. CONTRAST:  75 cc Omnipaque 300 FLUOROSCOPY TIME:  20 minutes, 30 seconds (6144 mGy) COMPLICATIONS: None immediate. PROCEDURE: Informed consent was obtained from the patient's daughter following explanation of the procedure, risks, benefits and alternatives. All questions were addressed. A time out was performed  prior to the initiation of the procedure. Maximal barrier sterile technique utilized including caps, mask, sterile gowns, sterile gloves, large sterile drape, hand hygiene, and Betadine prep. The right femoral head was marked fluoroscopically. Under sterile conditions and local anesthesia, the right common femoral artery access was performed with a micropuncture needle. Under direct ultrasound guidance, the right common femoral was accessed with a micropuncture kit. An ultrasound image was saved for documentation purposes. This allowed for placement of a 5-French vascular sheath. A limited arteriogram was performed through the side arm of the sheath confirming appropriate access within the right common femoral artery. Over a Bentson wire, a Mickelson catheter was advanced the caudal  aspect of the thoracic aorta where was reformed, back bled and flushed. The Mickelson catheter was then utilized to select the celiac artery and a selective celiac arteriogram was performed. Next, with the use of a fathom 14 microwire, a STC microcatheter was advanced to the level of the gastroduodenal artery and a gastroduodenal arteriogram was performed. The microcatheter was then utilized to select the pancreaticoduodenal artery and a selective pancreaticoduodenal arteriogram performed. The peripheral aspect of the pancreaticoduodenal artery was then percutaneously coil embolized with multiple overlapping 3 mm and 4 mm interlock coils. Post embolization arteriogram was performed from level of the GDA. The microcatheter was then retracted to the level of the gastroduodenal artery and the gastroduodenal artery was embolized to near its origin with multiple overlapping 5 mm and 6 mm interlock coils. The Microcatheter was drawn to the level of the common hepatic artery and a selective common hepatic arteriogram was performed. The microcatheter was removed and a celiac arteriogram was performed through the [facility] catheter. Next, with  the use of a fathom 14 microwire, a high-flow microcatheter was advanced to the level of the proper hepatic artery and a proper hepatic arteriogram was performed. The microcatheter was utilized to select the anterior division of the right hepatic artery and a selective arteriogram was performed. Next, the anterior division of the right hepatic artery was percutaneously embolized with approximately 4/5 of one vial of 500-700 Embospheres. Post embolization arteriogram was performed. Next, the remainder of the right hepatic artery was particle embolized with the remaining 1 and 1/5 vials of 500-700 Embospheres. The microcatheter was retracted to the level of the common hepatic artery and completion arteriograms were performed. Images were reviewed and the procedure was terminated. All wires and catheters were removed from the patient. Given patient's elevated INR (greater than 4), the vascular sheath was secured at the right groin access site within interrupted suture and connected to a pressure bag. The patient tolerated the procedure well without immediate post procedural complication. FINDINGS: Selective celiac arteriogram demonstrates conventional anatomy. Note is again made of parasitized vascularity from multiple divisions of the right hepatic artery as well as from a hypertrophy pancreaticoduodenal artery as was suggested on preceding CTA. Additionally, there is early AV shunting about right lobe of the liver likely secondary to patient's background of cirrhosis. Previously identified area of active arterial extravasation demonstrated on preceding abdominal CT is not definitely angiographically. Selective pancreaticoduodenal arteriogram demonstrates apparent parasitized vascularity to the gallbladder fossa, again with abnormal AV shunting. Following percutaneous coil embolization of both the pancreaticoduodenal and gastroduodenal arteries, there is complete stasis of flow through this vascular distribution.  Selective arteriograms of the anterior division of the right hepatic artery as well as the main right hepatic artery demonstrates abnormal vascularity and parasitized flow about the gallbladder fossa. Following percutaneous particle embolization, there is marked reduction in flow and pruning of the peripheral vasculature throughout the right hepatic arterial vascular distribution. IMPRESSION: 1. Technically successful percutaneous coil embolization of the pancreaticoduodenal and gastroduodenal arteries felt to contribute to paired to rise parasitized abnormal flow to the gallbladder fossa. 2. Technically successful prophylactic percutaneous particle embolization of the anterior division of the right hepatic artery as well as the right hepatic artery due to abnormal vascularity about the gallbladder fossa. PLAN: - Continue resuscitative measures as per the ICU staff. - The patient is to remain flat while vascular sheath remains in place. - Plan to remove vascular sheath once INR below 1.5 (ideally). Electronically Signed   By: Jenny Reichmann  Watts M.D.   On: 07/08/2020 09:55   IR Angiogram Selective Each Additional Vessel  Result Date: 07/08/2020 INDICATION: History of cirrhosis and end-stage renal disease, currently on dialysis now with attempted though non successful cholecystectomy with postoperative bleeding found to have active extravasation about the gallbladder fossa. Please perform mesenteric arteriogram and percutaneous embolization as indicated. EXAM: 1. ULTRASOUND GUIDANCE FOR ARTERIAL ACCESS 2. SELECTIVE CELIAC ARTERIOGRAM 3. SELECTIVE PANCREATICODUODENAL ARTERIOGRAM AND PERCUTANEOUS COIL EMBOLIZATION 4. SELECTIVE GASTRODUODENAL ARTERIOGRAM AND PERCUTANEOUS COIL EMBOLIZATION 5. SELECTIVE PROPER HEPATIC ARTERIOGRAM 6. SELECTIVE ARTERIOGRAM OF ANTERIOR DIVISION OF THE RIGHT HEPATIC ARTERY AND PERCUTANEOUS PARTICLE EMBOLIZATION 7. SELECTIVE ARTERIOGRAM OF THE RIGHT HEPATIC ARTERY AND PERCUTANEOUS PARTICLE  EMBOLIZATION COMPARISON:  CT abdomen and pelvis-earlier same day MEDICATIONS: None ANESTHESIA/SEDATION: Moderate (conscious) sedation was employed during this procedure. A total of Versed 4 mg and Fentanyl 200 mcg was administered intravenously. Moderate Sedation Time: 72 minutes. The patient's level of consciousness and vital signs were monitored continuously by radiology nursing throughout the procedure under my direct supervision. CONTRAST:  75 cc Omnipaque 300 FLUOROSCOPY TIME:  20 minutes, 30 seconds (1751 mGy) COMPLICATIONS: None immediate. PROCEDURE: Informed consent was obtained from the patient's daughter following explanation of the procedure, risks, benefits and alternatives. All questions were addressed. A time out was performed prior to the initiation of the procedure. Maximal barrier sterile technique utilized including caps, mask, sterile gowns, sterile gloves, large sterile drape, hand hygiene, and Betadine prep. The right femoral head was marked fluoroscopically. Under sterile conditions and local anesthesia, the right common femoral artery access was performed with a micropuncture needle. Under direct ultrasound guidance, the right common femoral was accessed with a micropuncture kit. An ultrasound image was saved for documentation purposes. This allowed for placement of a 5-French vascular sheath. A limited arteriogram was performed through the side arm of the sheath confirming appropriate access within the right common femoral artery. Over a Bentson wire, a Mickelson catheter was advanced the caudal aspect of the thoracic aorta where was reformed, back bled and flushed. The Mickelson catheter was then utilized to select the celiac artery and a selective celiac arteriogram was performed. Next, with the use of a fathom 14 microwire, a STC microcatheter was advanced to the level of the gastroduodenal artery and a gastroduodenal arteriogram was performed. The microcatheter was then utilized to select  the pancreaticoduodenal artery and a selective pancreaticoduodenal arteriogram performed. The peripheral aspect of the pancreaticoduodenal artery was then percutaneously coil embolized with multiple overlapping 3 mm and 4 mm interlock coils. Post embolization arteriogram was performed from level of the GDA. The microcatheter was then retracted to the level of the gastroduodenal artery and the gastroduodenal artery was embolized to near its origin with multiple overlapping 5 mm and 6 mm interlock coils. The Microcatheter was drawn to the level of the common hepatic artery and a selective common hepatic arteriogram was performed. The microcatheter was removed and a celiac arteriogram was performed through the [facility] catheter. Next, with the use of a fathom 14 microwire, a high-flow microcatheter was advanced to the level of the proper hepatic artery and a proper hepatic arteriogram was performed. The microcatheter was utilized to select the anterior division of the right hepatic artery and a selective arteriogram was performed. Next, the anterior division of the right hepatic artery was percutaneously embolized with approximately 4/5 of one vial of 500-700 Embospheres. Post embolization arteriogram was performed. Next, the remainder of the right hepatic artery was particle embolized with the remaining 1 and 1/5  vials of 500-700 Embospheres. The microcatheter was retracted to the level of the common hepatic artery and completion arteriograms were performed. Images were reviewed and the procedure was terminated. All wires and catheters were removed from the patient. Given patient's elevated INR (greater than 4), the vascular sheath was secured at the right groin access site within interrupted suture and connected to a pressure bag. The patient tolerated the procedure well without immediate post procedural complication. FINDINGS: Selective celiac arteriogram demonstrates conventional anatomy. Note is again made of  parasitized vascularity from multiple divisions of the right hepatic artery as well as from a hypertrophy pancreaticoduodenal artery as was suggested on preceding CTA. Additionally, there is early AV shunting about right lobe of the liver likely secondary to patient's background of cirrhosis. Previously identified area of active arterial extravasation demonstrated on preceding abdominal CT is not definitely angiographically. Selective pancreaticoduodenal arteriogram demonstrates apparent parasitized vascularity to the gallbladder fossa, again with abnormal AV shunting. Following percutaneous coil embolization of both the pancreaticoduodenal and gastroduodenal arteries, there is complete stasis of flow through this vascular distribution. Selective arteriograms of the anterior division of the right hepatic artery as well as the main right hepatic artery demonstrates abnormal vascularity and parasitized flow about the gallbladder fossa. Following percutaneous particle embolization, there is marked reduction in flow and pruning of the peripheral vasculature throughout the right hepatic arterial vascular distribution. IMPRESSION: 1. Technically successful percutaneous coil embolization of the pancreaticoduodenal and gastroduodenal arteries felt to contribute to paired to rise parasitized abnormal flow to the gallbladder fossa. 2. Technically successful prophylactic percutaneous particle embolization of the anterior division of the right hepatic artery as well as the right hepatic artery due to abnormal vascularity about the gallbladder fossa. PLAN: - Continue resuscitative measures as per the ICU staff. - The patient is to remain flat while vascular sheath remains in place. - Plan to remove vascular sheath once INR below 1.5 (ideally). Electronically Signed   By: Sandi Mariscal M.D.   On: 07/08/2020 09:55   IR US Guide Vasc Access Right  Result Date: 07/08/2020 INDICATION: History of cirrhosis and end-stage renal  disease, currently on dialysis now with attempted though non successful cholecystectomy with postoperative bleeding found to have active extravasation about the gallbladder fossa. Please perform mesenteric arteriogram and percutaneous embolization as indicated. EXAM: 1. ULTRASOUND GUIDANCE FOR ARTERIAL ACCESS 2. SELECTIVE CELIAC ARTERIOGRAM 3. SELECTIVE PANCREATICODUODENAL ARTERIOGRAM AND PERCUTANEOUS COIL EMBOLIZATION 4. SELECTIVE GASTRODUODENAL ARTERIOGRAM AND PERCUTANEOUS COIL EMBOLIZATION 5. SELECTIVE PROPER HEPATIC ARTERIOGRAM 6. SELECTIVE ARTERIOGRAM OF ANTERIOR DIVISION OF THE RIGHT HEPATIC ARTERY AND PERCUTANEOUS PARTICLE EMBOLIZATION 7. SELECTIVE ARTERIOGRAM OF THE RIGHT HEPATIC ARTERY AND PERCUTANEOUS PARTICLE EMBOLIZATION COMPARISON:  CT abdomen and pelvis-earlier same day MEDICATIONS: None ANESTHESIA/SEDATION: Moderate (conscious) sedation was employed during this procedure. A total of Versed 4 mg and Fentanyl 200 mcg was administered intravenously. Moderate Sedation Time: 72 minutes. The patient's level of consciousness and vital signs were monitored continuously by radiology nursing throughout the procedure under my direct supervision. CONTRAST:  75 cc Omnipaque 300 FLUOROSCOPY TIME:  20 minutes, 30 seconds (5462 mGy) COMPLICATIONS: None immediate. PROCEDURE: Informed consent was obtained from the patient's daughter following explanation of the procedure, risks, benefits and alternatives. All questions were addressed. A time out was performed prior to the initiation of the procedure. Maximal barrier sterile technique utilized including caps, mask, sterile gowns, sterile gloves, large sterile drape, hand hygiene, and Betadine prep. The right femoral head was marked fluoroscopically. Under sterile conditions and local anesthesia, the right common  femoral artery access was performed with a micropuncture needle. Under direct ultrasound guidance, the right common femoral was accessed with a micropuncture  kit. An ultrasound image was saved for documentation purposes. This allowed for placement of a 5-French vascular sheath. A limited arteriogram was performed through the side arm of the sheath confirming appropriate access within the right common femoral artery. Over a Bentson wire, a Mickelson catheter was advanced the caudal aspect of the thoracic aorta where was reformed, back bled and flushed. The Mickelson catheter was then utilized to select the celiac artery and a selective celiac arteriogram was performed. Next, with the use of a fathom 14 microwire, a STC microcatheter was advanced to the level of the gastroduodenal artery and a gastroduodenal arteriogram was performed. The microcatheter was then utilized to select the pancreaticoduodenal artery and a selective pancreaticoduodenal arteriogram performed. The peripheral aspect of the pancreaticoduodenal artery was then percutaneously coil embolized with multiple overlapping 3 mm and 4 mm interlock coils. Post embolization arteriogram was performed from level of the GDA. The microcatheter was then retracted to the level of the gastroduodenal artery and the gastroduodenal artery was embolized to near its origin with multiple overlapping 5 mm and 6 mm interlock coils. The Microcatheter was drawn to the level of the common hepatic artery and a selective common hepatic arteriogram was performed. The microcatheter was removed and a celiac arteriogram was performed through the [facility] catheter. Next, with the use of a fathom 14 microwire, a high-flow microcatheter was advanced to the level of the proper hepatic artery and a proper hepatic arteriogram was performed. The microcatheter was utilized to select the anterior division of the right hepatic artery and a selective arteriogram was performed. Next, the anterior division of the right hepatic artery was percutaneously embolized with approximately 4/5 of one vial of 500-700 Embospheres. Post embolization  arteriogram was performed. Next, the remainder of the right hepatic artery was particle embolized with the remaining 1 and 1/5 vials of 500-700 Embospheres. The microcatheter was retracted to the level of the common hepatic artery and completion arteriograms were performed. Images were reviewed and the procedure was terminated. All wires and catheters were removed from the patient. Given patient's elevated INR (greater than 4), the vascular sheath was secured at the right groin access site within interrupted suture and connected to a pressure bag. The patient tolerated the procedure well without immediate post procedural complication. FINDINGS: Selective celiac arteriogram demonstrates conventional anatomy. Note is again made of parasitized vascularity from multiple divisions of the right hepatic artery as well as from a hypertrophy pancreaticoduodenal artery as was suggested on preceding CTA. Additionally, there is early AV shunting about right lobe of the liver likely secondary to patient's background of cirrhosis. Previously identified area of active arterial extravasation demonstrated on preceding abdominal CT is not definitely angiographically. Selective pancreaticoduodenal arteriogram demonstrates apparent parasitized vascularity to the gallbladder fossa, again with abnormal AV shunting. Following percutaneous coil embolization of both the pancreaticoduodenal and gastroduodenal arteries, there is complete stasis of flow through this vascular distribution. Selective arteriograms of the anterior division of the right hepatic artery as well as the main right hepatic artery demonstrates abnormal vascularity and parasitized flow about the gallbladder fossa. Following percutaneous particle embolization, there is marked reduction in flow and pruning of the peripheral vasculature throughout the right hepatic arterial vascular distribution. IMPRESSION: 1. Technically successful percutaneous coil embolization of the  pancreaticoduodenal and gastroduodenal arteries felt to contribute to paired to rise parasitized abnormal flow to the gallbladder  fossa. 2. Technically successful prophylactic percutaneous particle embolization of the anterior division of the right hepatic artery as well as the right hepatic artery due to abnormal vascularity about the gallbladder fossa. PLAN: - Continue resuscitative measures as per the ICU staff. - The patient is to remain flat while vascular sheath remains in place. - Plan to remove vascular sheath once INR below 1.5 (ideally). Electronically Signed   By: Sandi Mariscal M.D.   On: 07/08/2020 09:55   DG CHEST PORT 1 VIEW  Result Date: 07/11/2020 CLINICAL DATA:  Aspiration. EXAM: PORTABLE CHEST 1 VIEW COMPARISON:  07/10/2020 FINDINGS: Patient slightly rotated to the left. Endotracheal tube has tip 4.8 cm above the carina. Right IJ central venous catheter unchanged. Enteric tube courses into the region of the stomach and off the film as tip is not visualized. Left IJ unchanged with tip over left brachiocephalic vein stent. Lungs are adequately inflated demonstrate mild hazy prominence of the right perihilar vessels slightly improved. No effusion or pneumothorax. Cardiomediastinal silhouette and remainder of the exam is unchanged. IMPRESSION: 1. Interval improvement and hazy prominence of the perihilar vessels suggesting improving vascular congestion. 2.  Multiple stable tubes and lines as described. Electronically Signed   By: Marin Olp M.D.   On: 07/11/2020 08:12   DG Chest Port 1 View  Result Date: 07/10/2020 CLINICAL DATA:  Acute respiratory failure. EXAM: PORTABLE CHEST 1 VIEW COMPARISON:  07/08/2020 FINDINGS: ET tube tip is above the carina. There is a right IJ catheter with tip projecting over the SVC. Left IJ catheter tip is in the expected location of the left innominate vein where a vascular graft is noted. Heart size is normal. There is been interval re-expansion of the left upper  lobe with residual linear platelike atelectasis. Persistent left pleural effusion with persistent atelectasis and or airspace consolidation in the left base. Small right pleural effusion is suspected with mild veil like opacification of the right lower lobe. IMPRESSION: 1. Interval re-expansion of the left upper lobe. 2. Persistent bilateral pleural effusions, left greater than right. 3. Persistent atelectasis and/or airspace consolidation in the left base. Electronically Signed   By: Kerby Moors M.D.   On: 07/10/2020 08:21   IR CHOLANGIOGRAM EXISTING TUBE  Result Date: 07/09/2020 INDICATION: Attempted cholecystectomy complicated by hemorrhage requiring arterial embolization. A balloon retention catheter was placed surgically into the gallbladder lumen. EXAM: CHOLANGIOGRAM THROUGH EXISTING CATHETER MEDICATIONS: None indicated ANESTHESIA/SEDATION: None required FLUOROSCOPY TIME:  2 minutes 48 seconds; 67 mGy COMPLICATIONS: None immediate. PROCEDURE: Under fluoroscopy, contrast was injected through the surgically placed cholecystostomy catheter. Injection demonstrates good position of the catheter and retention balloon within the lumen of the nondilated gallbladder. There is irregular debris within the lumen of the gallbladder which may represent sludge and/or stones. Embolization coils are noted in the region of the hepatic artery. The cystic duct is patent. There is some opacification of the common hepatic duct. With continued injection, meniscus is seen in the proximal CBD with no distal passage of contrast. No extravasation identified. With aspiration, the gallbladder and biliary tree are decompressed. The catheter was replaced to external drainage. IMPRESSION: 1. Obstructing calculus in the proximal CBD. 2. Cholelithiasis 3. Cystic duct is patent and communicates with the intrahepatic biliary tree. Electronically Signed   By: Lucrezia Europe M.D.   On: 07/09/2020 17:36   IR EMBO ART  VEN HEMORR LYMPH EXTRAV   INC GUIDE ROADMAPPING  Result Date: 07/08/2020 INDICATION: History of cirrhosis and end-stage renal disease, currently on  dialysis now with attempted though non successful cholecystectomy with postoperative bleeding found to have active extravasation about the gallbladder fossa. Please perform mesenteric arteriogram and percutaneous embolization as indicated. EXAM: 1. ULTRASOUND GUIDANCE FOR ARTERIAL ACCESS 2. SELECTIVE CELIAC ARTERIOGRAM 3. SELECTIVE PANCREATICODUODENAL ARTERIOGRAM AND PERCUTANEOUS COIL EMBOLIZATION 4. SELECTIVE GASTRODUODENAL ARTERIOGRAM AND PERCUTANEOUS COIL EMBOLIZATION 5. SELECTIVE PROPER HEPATIC ARTERIOGRAM 6. SELECTIVE ARTERIOGRAM OF ANTERIOR DIVISION OF THE RIGHT HEPATIC ARTERY AND PERCUTANEOUS PARTICLE EMBOLIZATION 7. SELECTIVE ARTERIOGRAM OF THE RIGHT HEPATIC ARTERY AND PERCUTANEOUS PARTICLE EMBOLIZATION COMPARISON:  CT abdomen and pelvis-earlier same day MEDICATIONS: None ANESTHESIA/SEDATION: Moderate (conscious) sedation was employed during this procedure. A total of Versed 4 mg and Fentanyl 200 mcg was administered intravenously. Moderate Sedation Time: 72 minutes. The patient's level of consciousness and vital signs were monitored continuously by radiology nursing throughout the procedure under my direct supervision. CONTRAST:  75 cc Omnipaque 300 FLUOROSCOPY TIME:  20 minutes, 30 seconds (6295 mGy) COMPLICATIONS: None immediate. PROCEDURE: Informed consent was obtained from the patient's daughter following explanation of the procedure, risks, benefits and alternatives. All questions were addressed. A time out was performed prior to the initiation of the procedure. Maximal barrier sterile technique utilized including caps, mask, sterile gowns, sterile gloves, large sterile drape, hand hygiene, and Betadine prep. The right femoral head was marked fluoroscopically. Under sterile conditions and local anesthesia, the right common femoral artery access was performed with a micropuncture  needle. Under direct ultrasound guidance, the right common femoral was accessed with a micropuncture kit. An ultrasound image was saved for documentation purposes. This allowed for placement of a 5-French vascular sheath. A limited arteriogram was performed through the side arm of the sheath confirming appropriate access within the right common femoral artery. Over a Bentson wire, a Mickelson catheter was advanced the caudal aspect of the thoracic aorta where was reformed, back bled and flushed. The Mickelson catheter was then utilized to select the celiac artery and a selective celiac arteriogram was performed. Next, with the use of a fathom 14 microwire, a STC microcatheter was advanced to the level of the gastroduodenal artery and a gastroduodenal arteriogram was performed. The microcatheter was then utilized to select the pancreaticoduodenal artery and a selective pancreaticoduodenal arteriogram performed. The peripheral aspect of the pancreaticoduodenal artery was then percutaneously coil embolized with multiple overlapping 3 mm and 4 mm interlock coils. Post embolization arteriogram was performed from level of the GDA. The microcatheter was then retracted to the level of the gastroduodenal artery and the gastroduodenal artery was embolized to near its origin with multiple overlapping 5 mm and 6 mm interlock coils. The Microcatheter was drawn to the level of the common hepatic artery and a selective common hepatic arteriogram was performed. The microcatheter was removed and a celiac arteriogram was performed through the [facility] catheter. Next, with the use of a fathom 14 microwire, a high-flow microcatheter was advanced to the level of the proper hepatic artery and a proper hepatic arteriogram was performed. The microcatheter was utilized to select the anterior division of the right hepatic artery and a selective arteriogram was performed. Next, the anterior division of the right hepatic artery was  percutaneously embolized with approximately 4/5 of one vial of 500-700 Embospheres. Post embolization arteriogram was performed. Next, the remainder of the right hepatic artery was particle embolized with the remaining 1 and 1/5 vials of 500-700 Embospheres. The microcatheter was retracted to the level of the common hepatic artery and completion arteriograms were performed. Images were reviewed and the procedure was terminated.  All wires and catheters were removed from the patient. Given patient's elevated INR (greater than 4), the vascular sheath was secured at the right groin access site within interrupted suture and connected to a pressure bag. The patient tolerated the procedure well without immediate post procedural complication. FINDINGS: Selective celiac arteriogram demonstrates conventional anatomy. Note is again made of parasitized vascularity from multiple divisions of the right hepatic artery as well as from a hypertrophy pancreaticoduodenal artery as was suggested on preceding CTA. Additionally, there is early AV shunting about right lobe of the liver likely secondary to patient's background of cirrhosis. Previously identified area of active arterial extravasation demonstrated on preceding abdominal CT is not definitely angiographically. Selective pancreaticoduodenal arteriogram demonstrates apparent parasitized vascularity to the gallbladder fossa, again with abnormal AV shunting. Following percutaneous coil embolization of both the pancreaticoduodenal and gastroduodenal arteries, there is complete stasis of flow through this vascular distribution. Selective arteriograms of the anterior division of the right hepatic artery as well as the main right hepatic artery demonstrates abnormal vascularity and parasitized flow about the gallbladder fossa. Following percutaneous particle embolization, there is marked reduction in flow and pruning of the peripheral vasculature throughout the right hepatic arterial  vascular distribution. IMPRESSION: 1. Technically successful percutaneous coil embolization of the pancreaticoduodenal and gastroduodenal arteries felt to contribute to paired to rise parasitized abnormal flow to the gallbladder fossa. 2. Technically successful prophylactic percutaneous particle embolization of the anterior division of the right hepatic artery as well as the right hepatic artery due to abnormal vascularity about the gallbladder fossa. PLAN: - Continue resuscitative measures as per the ICU staff. - The patient is to remain flat while vascular sheath remains in place. - Plan to remove vascular sheath once INR below 1.5 (ideally). Electronically Signed   By: Sandi Mariscal M.D.   On: 07/08/2020 09:55    Labs:  CBC: Recent Labs    07/10/20 0513 07/10/20 1510 07/11/20 0445 07/12/20 0449  WBC 11.6* 9.1 16.4* 25.8*  HGB 7.8* 9.6* 9.5* 9.1*  HCT 24.7* 29.7* 29.0* 29.4*  PLT 46* 37* 58* 24*    COAGS: Recent Labs    07/07/2020 0026 06/22/2020 1744 07/08/20 2342 07/08/20 2342 07/09/20 0058 07/10/20 0513 07/11/20 0445 07/12/20 0449  INR 1.2   < > 2.6*   < > 2.5* 1.9* 2.4* 3.4*  APTT 34  --  46*  --  45*  --   --   --    < > = values in this interval not displayed.    BMP: Recent Labs    07/10/20 1510 07/11/20 0445 07/11/20 1517 07/12/20 0449  NA 134* 134* 133* 129*  K 3.7 3.9 4.4 4.5  CL 98 99 100 97*  CO2 20* 20* 21* 15*  GLUCOSE 141* 123* 90 174*  BUN 7* <5* 5* 10  CALCIUM 9.9 10.1 10.1 12.3*  CREATININE 1.01* 1.01* 1.01* 1.97*  GFRNONAA 54* 54* 54* 24*  GFRAA >60 >60 >60 28*    LIVER FUNCTION TESTS: Recent Labs    07/08/20 0512 07/08/20 1600 07/09/20 0112 07/09/20 0452 07/10/20 1510 07/11/20 0445 07/11/20 1517 07/12/20 0449  BILITOT 8.9*  --  11.9*  --  18.6* 18.6*  --   --   AST 5,112*  --  3,737*  --  1,396* 826*  --   --   ALT 1,698*  --  1,453*  --  937* 731*  --   --   ALKPHOS 161*  --  185*  --  265* 260*  --   --  PROT 6.5  --  6.3*  --   7.9 7.0  --   --   ALBUMIN 3.9   < > 3.5   < > 3.6 3.1* 2.7* 2.2*   < > = values in this interval not displayed.    Assessment and Plan:  CCS note today: Choledocholithiasis  7/18 ERCP unsuccessful  7/19 ATTEMPTED LAPAROSCOPIC CHOLECYSTECTOMY, PLACEMENT OF DRAINS (N/A) 7/19 Dr. Georgette Dover 7/21 IR embolization of right hepatic artery  - remains intubated with intermittent pressor requirements, CRRT stopped yesterday  INR 3.4, plts 24. CMP yesterday w/ bili 18.6, AST 826, SLT 731, AP 260  Remains critically ill and unstable. Ultimately needs biliary decompression but has been too unstable for a procedure. Hopefully IR will be able place a perc drain transhepatically this week to decompress the CBD.  She is not a candidate for surgery and a common bile duct exploration in her condition  IR Will follow Plan per CCS  Electronically Signed: Lavonia Drafts, PA-C 07/12/2020, 9:13 AM   I spent a total of 15 Minutes at the the patient's bedside AND on the patient's hospital floor or unit, greater than 50% of which was counseling/coordinating care for embolization Rt hepatic artery

## 2020-07-13 ENCOUNTER — Inpatient Hospital Stay (HOSPITAL_COMMUNITY): Payer: Medicare Other

## 2020-07-13 DIAGNOSIS — K8042 Calculus of bile duct with acute cholecystitis without obstruction: Secondary | ICD-10-CM | POA: Diagnosis not present

## 2020-07-13 DIAGNOSIS — Z452 Encounter for adjustment and management of vascular access device: Secondary | ICD-10-CM | POA: Diagnosis not present

## 2020-07-13 DIAGNOSIS — K7030 Alcoholic cirrhosis of liver without ascites: Secondary | ICD-10-CM | POA: Diagnosis not present

## 2020-07-13 DIAGNOSIS — R6521 Severe sepsis with septic shock: Secondary | ICD-10-CM

## 2020-07-13 DIAGNOSIS — B49 Unspecified mycosis: Secondary | ICD-10-CM

## 2020-07-13 DIAGNOSIS — J9600 Acute respiratory failure, unspecified whether with hypoxia or hypercapnia: Secondary | ICD-10-CM

## 2020-07-13 DIAGNOSIS — K810 Acute cholecystitis: Secondary | ICD-10-CM

## 2020-07-13 DIAGNOSIS — A419 Sepsis, unspecified organism: Secondary | ICD-10-CM

## 2020-07-13 DIAGNOSIS — K8050 Calculus of bile duct without cholangitis or cholecystitis without obstruction: Secondary | ICD-10-CM | POA: Diagnosis not present

## 2020-07-13 LAB — POCT I-STAT 7, (LYTES, BLD GAS, ICA,H+H)
Acid-base deficit: 21 mmol/L — ABNORMAL HIGH (ref 0.0–2.0)
Acid-base deficit: 9 mmol/L — ABNORMAL HIGH (ref 0.0–2.0)
Bicarbonate: 7.3 mmol/L — ABNORMAL LOW (ref 20.0–28.0)
Bicarbonate: 17.4 mmol/L — ABNORMAL LOW (ref 20.0–28.0)
Calcium, Ion: 1.61 mmol/L (ref 1.15–1.40)
Calcium, Ion: 0.66 mmol/L — CL (ref 1.15–1.40)
HCT: 25 % — ABNORMAL LOW (ref 36.0–46.0)
HCT: 28 % — ABNORMAL LOW (ref 36.0–46.0)
Hemoglobin: 8.5 g/dL — ABNORMAL LOW (ref 12.0–15.0)
Hemoglobin: 9.5 g/dL — ABNORMAL LOW (ref 12.0–15.0)
O2 Saturation: 97 %
O2 Saturation: 100 %
Patient temperature: 36.8
Patient temperature: 93.1
Potassium: 7.3 mmol/L (ref 3.5–5.1)
Potassium: 4.4 mmol/L (ref 3.5–5.1)
Sodium: 120 mmol/L — ABNORMAL LOW (ref 135–145)
Sodium: 133 mmol/L — ABNORMAL LOW (ref 135–145)
TCO2: 8 mmol/L — ABNORMAL LOW (ref 22–32)
TCO2: 19 mmol/L — ABNORMAL LOW (ref 22–32)
pCO2 arterial: 23.9 mmHg — ABNORMAL LOW (ref 32.0–48.0)
pCO2 arterial: 35.9 mmHg (ref 32.0–48.0)
pH, Arterial: 7.092 — CL (ref 7.350–7.450)
pH, Arterial: 7.278 — ABNORMAL LOW (ref 7.350–7.450)
pO2, Arterial: 118 mmHg — ABNORMAL HIGH (ref 83.0–108.0)
pO2, Arterial: 330 mmHg — ABNORMAL HIGH (ref 83.0–108.0)

## 2020-07-13 LAB — COMPREHENSIVE METABOLIC PANEL
ALT: 1221 U/L — ABNORMAL HIGH (ref 0–44)
AST: 2068 U/L — ABNORMAL HIGH (ref 15–41)
Albumin: 1.8 g/dL — ABNORMAL LOW (ref 3.5–5.0)
Alkaline Phosphatase: 780 U/L — ABNORMAL HIGH (ref 38–126)
Anion gap: 19 — ABNORMAL HIGH (ref 5–15)
BUN: 14 mg/dL (ref 8–23)
CO2: 8 mmol/L — ABNORMAL LOW (ref 22–32)
Calcium: >15 mg/dL (ref 8.9–10.3)
Chloride: 94 mmol/L — ABNORMAL LOW (ref 98–111)
Creatinine, Ser: 3.17 mg/dL — ABNORMAL HIGH (ref 0.44–1.00)
GFR calc Af Amer: 16 mL/min — ABNORMAL LOW (ref >60)
GFR calc non Af Amer: 14 mL/min — ABNORMAL LOW (ref >60)
Glucose, Bld: 123 mg/dL — ABNORMAL HIGH (ref 70–99)
Potassium: 6.9 mmol/L (ref 3.5–5.1)
Sodium: 121 mmol/L — ABNORMAL LOW (ref 135–145)
Total Bilirubin: 15.6 mg/dL — ABNORMAL HIGH (ref 0.3–1.2)
Total Protein: 4.8 g/dL — ABNORMAL LOW (ref 6.5–8.1)

## 2020-07-13 LAB — CBC
HCT: 29.6 % — ABNORMAL LOW (ref 36.0–46.0)
Hemoglobin: 8.6 g/dL — ABNORMAL LOW (ref 12.0–15.0)
MCH: 30.3 pg (ref 26.0–34.0)
MCHC: 29.1 g/dL — ABNORMAL LOW (ref 30.0–36.0)
MCV: 104.2 fL — ABNORMAL HIGH (ref 80.0–100.0)
Platelets: 16 10*3/uL — CL (ref 150–400)
RBC: 2.84 MIL/uL — ABNORMAL LOW (ref 3.87–5.11)
RDW: 17.2 % — ABNORMAL HIGH (ref 11.5–15.5)
WBC: 33 10*3/uL — ABNORMAL HIGH (ref 4.0–10.5)
nRBC: 33.3 % — ABNORMAL HIGH (ref 0.0–0.2)

## 2020-07-13 LAB — RENAL FUNCTION PANEL
Albumin: 1.6 g/dL — ABNORMAL LOW (ref 3.5–5.0)
Albumin: 1.7 g/dL — ABNORMAL LOW (ref 3.5–5.0)
Anion gap: 21 — ABNORMAL HIGH (ref 5–15)
BUN: 14 mg/dL (ref 8–23)
BUN: 6 mg/dL — ABNORMAL LOW (ref 8–23)
CO2: <7 mmol/L — ABNORMAL LOW (ref 22–32)
CO2: 15 mmol/L — ABNORMAL LOW (ref 22–32)
Calcium: >15 mg/dL (ref 8.9–10.3)
Calcium: 9.5 mg/dL (ref 8.9–10.3)
Chloride: 95 mmol/L — ABNORMAL LOW (ref 98–111)
Chloride: 96 mmol/L — ABNORMAL LOW (ref 98–111)
Creatinine, Ser: 3.38 mg/dL — ABNORMAL HIGH (ref 0.44–1.00)
Creatinine, Ser: 1.48 mg/dL — ABNORMAL HIGH (ref 0.44–1.00)
GFR calc Af Amer: 15 mL/min — ABNORMAL LOW (ref >60)
GFR calc Af Amer: 40 mL/min — ABNORMAL LOW (ref >60)
GFR calc non Af Amer: 13 mL/min — ABNORMAL LOW (ref >60)
GFR calc non Af Amer: 34 mL/min — ABNORMAL LOW (ref >60)
Glucose, Bld: 128 mg/dL — ABNORMAL HIGH (ref 70–99)
Glucose, Bld: 123 mg/dL — ABNORMAL HIGH (ref 70–99)
Phosphorus: >12 mg/dL — ABNORMAL HIGH (ref 2.5–4.6)
Phosphorus: 6.6 mg/dL — ABNORMAL HIGH (ref 2.5–4.6)
Potassium: >7.5 mmol/L (ref 3.5–5.1)
Potassium: 4.1 mmol/L (ref 3.5–5.1)
Sodium: 122 mmol/L — ABNORMAL LOW (ref 135–145)
Sodium: 132 mmol/L — ABNORMAL LOW (ref 135–145)

## 2020-07-13 LAB — BLOOD CULTURE ID PANEL (REFLEXED)
Acinetobacter baumannii: NOT DETECTED
Candida albicans: DETECTED — AB
Candida glabrata: NOT DETECTED
Candida krusei: NOT DETECTED
Candida parapsilosis: NOT DETECTED
Candida tropicalis: NOT DETECTED
Enterobacter cloacae complex: NOT DETECTED
Enterobacteriaceae species: NOT DETECTED
Enterococcus species: NOT DETECTED
Escherichia coli: NOT DETECTED
Haemophilus influenzae: NOT DETECTED
Klebsiella oxytoca: NOT DETECTED
Klebsiella pneumoniae: NOT DETECTED
Listeria monocytogenes: NOT DETECTED
Neisseria meningitidis: NOT DETECTED
Proteus species: NOT DETECTED
Pseudomonas aeruginosa: NOT DETECTED
Serratia marcescens: NOT DETECTED
Staphylococcus aureus (BCID): NOT DETECTED
Staphylococcus species: NOT DETECTED
Streptococcus agalactiae: NOT DETECTED
Streptococcus pneumoniae: NOT DETECTED
Streptococcus pyogenes: NOT DETECTED
Streptococcus species: NOT DETECTED

## 2020-07-13 LAB — AMMONIA: Ammonia: 81 umol/L — ABNORMAL HIGH (ref 9–35)

## 2020-07-13 LAB — PROTIME-INR
INR: 6.9 (ref 0.8–1.2)
INR: 3.4 — ABNORMAL HIGH (ref 0.8–1.2)
INR: 3.9 — ABNORMAL HIGH (ref 0.8–1.2)
Prothrombin Time: 58 seconds — ABNORMAL HIGH (ref 11.4–15.2)
Prothrombin Time: 33 seconds — ABNORMAL HIGH (ref 11.4–15.2)
Prothrombin Time: 37.1 seconds — ABNORMAL HIGH (ref 11.4–15.2)

## 2020-07-13 LAB — GLUCOSE, CAPILLARY
Glucose-Capillary: 111 mg/dL — ABNORMAL HIGH (ref 70–99)
Glucose-Capillary: 108 mg/dL — ABNORMAL HIGH (ref 70–99)
Glucose-Capillary: 115 mg/dL — ABNORMAL HIGH (ref 70–99)
Glucose-Capillary: 118 mg/dL — ABNORMAL HIGH (ref 70–99)
Glucose-Capillary: 116 mg/dL — ABNORMAL HIGH (ref 70–99)

## 2020-07-13 LAB — TYPE AND SCREEN
ABO/RH(D): O POS
Antibody Screen: NEGATIVE

## 2020-07-13 LAB — LIPASE, BLOOD: Lipase: 26 U/L (ref 11–51)

## 2020-07-13 LAB — MAGNESIUM: Magnesium: 2.7 mg/dL — ABNORMAL HIGH (ref 1.7–2.4)

## 2020-07-13 MED ORDER — STERILE WATER FOR INJECTION IV SOLN
INTRAVENOUS | Status: DC
  Filled 2020-07-13 (×4): qty 850

## 2020-07-13 MED ORDER — SODIUM BICARBONATE 8.4 % IV SOLN
100.0000 meq | Freq: Once | INTRAVENOUS | Status: AC
  Administered 2020-07-13: 100 meq via INTRAVENOUS
  Filled 2020-07-13: qty 100

## 2020-07-13 MED ORDER — FLUCONAZOLE IN SODIUM CHLORIDE 400-0.9 MG/200ML-% IV SOLN
400.0000 mg | Freq: Once | INTRAVENOUS | Status: AC
  Administered 2020-07-13: 400 mg via INTRAVENOUS
  Filled 2020-07-13: qty 200

## 2020-07-13 MED ORDER — FLUCONAZOLE IN SODIUM CHLORIDE 400-0.9 MG/200ML-% IV SOLN: 800.0000 mg | INTRAVENOUS | Status: DC

## 2020-07-13 MED ORDER — SODIUM CHLORIDE 0.9 % IV SOLN
1.0000 g | Freq: Three times a day (TID) | INTRAVENOUS | Status: DC
  Administered 2020-07-13 (×2): 1 g via INTRAVENOUS
  Filled 2020-07-13 (×4): qty 1

## 2020-07-13 MED ORDER — SODIUM BICARBONATE 8.4 % IV SOLN
200.0000 meq | Freq: Once | INTRAVENOUS | Status: AC
  Administered 2020-07-13: 200 meq via INTRAVENOUS
  Filled 2020-07-13: qty 200

## 2020-07-13 MED ORDER — FLUCONAZOLE IN SODIUM CHLORIDE 400-0.9 MG/200ML-% IV SOLN
800.0000 mg | Freq: Once | INTRAVENOUS | Status: AC
  Administered 2020-07-13: 800 mg via INTRAVENOUS
  Filled 2020-07-13: qty 400

## 2020-07-13 MED ORDER — PRISMASOL BGK 0/2.5 32-2.5 MEQ/L IV SOLN
INTRAVENOUS | Status: DC
  Filled 2020-07-13 (×14): qty 5000

## 2020-07-13 MED ORDER — SODIUM ZIRCONIUM CYCLOSILICATE 5 G PO PACK
10.0000 g | PACK | Freq: Once | ORAL | Status: AC
  Administered 2020-07-13: 10 g
  Filled 2020-07-13: qty 2

## 2020-07-13 MED ORDER — PRISMASOL BGK 0/2.5 32-2.5 MEQ/L REPLACEMENT SOLN
Status: DC
  Filled 2020-07-13 (×3): qty 5000

## 2020-07-13 MED ORDER — SODIUM CHLORIDE 0.9% IV SOLUTION: Freq: Once | INTRAVENOUS | Status: DC

## 2020-07-13 MED ORDER — VITAMIN K1 10 MG/ML IJ SOLN
5.0000 mg | Freq: Once | INTRAVENOUS | Status: AC
  Administered 2020-07-13: 5 mg via INTRAVENOUS
  Filled 2020-07-13: qty 0.5

## 2020-07-13 MED ORDER — FLUCONAZOLE IN SODIUM CHLORIDE 400-0.9 MG/200ML-% IV SOLN
800.0000 mg | Freq: Once | INTRAVENOUS | Status: DC
  Filled 2020-07-13: qty 400

## 2020-07-13 MED ORDER — PRISMASOL BGK 0/2.5 32-2.5 MEQ/L REPLACEMENT SOLN
Status: DC
  Filled 2020-07-13 (×2): qty 5000

## 2020-07-13 MED ORDER — HEPARIN SODIUM (PORCINE) 1000 UNIT/ML DIALYSIS
1000.0000 [IU] | INTRAMUSCULAR | Status: DC | PRN
  Filled 2020-07-13: qty 6

## 2020-07-14 LAB — CULTURE, BLOOD (ROUTINE X 2): Special Requests: ADEQUATE

## 2020-07-14 LAB — BPAM FFP
Blood Product Expiration Date: 202107292359
Blood Product Expiration Date: 202107292359
Blood Product Expiration Date: 202107292359
ISSUE DATE / TIME: 202107270904
ISSUE DATE / TIME: 202107270904
ISSUE DATE / TIME: 202107270904
Unit Type and Rh: 5100
Unit Type and Rh: 5100
Unit Type and Rh: 5100

## 2020-07-14 LAB — PREPARE FRESH FROZEN PLASMA
Unit division: 0
Unit division: 0
Unit division: 0

## 2020-07-15 MED FILL — Sodium Chloride IV Soln 0.9%: INTRAVENOUS | Qty: 100 | Status: AC

## 2020-07-15 MED FILL — Vasopressin IV Soln 20 Unit/ML (For IV Infusion): INTRAVENOUS | Qty: 1 | Status: AC

## 2020-07-15 MED FILL — Sodium Chloride IV Soln 0.9%: INTRAVENOUS | Qty: 250 | Status: AC

## 2020-07-15 MED FILL — Norepinephrine Bitartrate IV Soln 1 MG/ML (Base Equivalent): INTRAVENOUS | Qty: 4 | Status: AC

## 2020-07-16 LAB — CULTURE, BLOOD (ROUTINE X 2): Special Requests: ADEQUATE

## 2020-07-18 NOTE — Progress Notes (Signed)
Daily Rounding Note  August 10, 2020, 1:14 PM  LOS: 10 days   SUBJECTIVE:   Chief complaint: Choledocholithiasis Remains critically ill an on vent.   BPs  w 60 of Levophed running.   BPS 702/20s.     Body temp as low as 89.2, warming blanket in place.  WBCs to 33 K.    OBJECTIVE:         Vital signs in last 24 hours:    Temp:  [89.2 F (31.8 C)-99.1 F (37.3 C)] 90.3 F (32.4 C) (07/27 1230) Pulse Rate:  [25-81] 64 (07/27 1136) Resp:  [22-36] 24 (07/27 1300) BP: (71-115)/(13-74) 77/28 (07/27 1136) SpO2:  [85 %-100 %] 85 % (07/27 0845) Arterial Line BP: (58-166)/(25-44) 77/29 (07/27 1300) FiO2 (%):  [30 %-40 %] 40 % (07/27 1136) Weight:  [71 kg] 71 kg (07/27 0405) Last BM Date: 07/12/20 Filed Weights   07/11/20 0500 07/12/20 0405 08-10-20 0405  Weight: 65.2 kg 63.9 kg 71 kg   General: ill.  Sedated on vent   Heart: Irreg, irreg Chest: clear, no labored breathng on vent Abdomen: Distended, tense.  Hypoactive BS.  NT.  JP drain w bloody discharge, biliray drain w scant marroon discharge.   Extremities: + LE edema Neuro/Psych:  Unresponsive on vent  Intake/Output from previous day: 07/26 0701 - 07/27 0700 In: 2486.2 [I.V.:2104.7; NG/GT:165; IV Piggyback:216.5] Out: 8 [Drains:8]  Intake/Output this shift: Total I/O In: 2970.2 [I.V.:1350.3; Blood:992; IV Piggyback:627.9] Out: -25   Lab Results: Recent Labs    07/11/20 0445 07/11/20 0445 07/12/20 0449 Aug 10, 2020 0329 08-10-20 0606  WBC 16.4*  --  25.8* 33.0*  --   HGB 9.5*   < > 9.1* 8.6* 8.5*  HCT 29.0*   < > 29.4* 29.6* 25.0*  PLT 58*  --  24* 16*  --    < > = values in this interval not displayed.   BMET Recent Labs    07/12/20 0449 07/12/20 0449 08/10/20 0329 August 10, 2020 0544 08/10/20 0606  NA 129*   < > 121* 122* 120*  K 4.5   < > 6.9* >7.5* 7.3*  CL 97*  --  94* 95*  --   CO2 15*  --  8* <7*  --   GLUCOSE 174*  --  123* 128*  --   BUN 10  --   14 14  --   CREATININE 1.97*  --  3.17* 3.38*  --   CALCIUM 12.3*  --  >15.0* >15.0*  --    < > = values in this interval not displayed.   LFT Recent Labs    07/10/20 1510 07/10/20 1510 07/11/20 0445 07/11/20 1517 07/12/20 0449 08/10/2020 0329 08/10/2020 0544  PROT 7.9  --  7.0  --   --  4.8*  --   ALBUMIN 3.6   < > 3.1*   < > 2.2* 1.8* 1.6*  AST 1,396*  --  826*  --   --  2,068*  --   ALT 937*  --  731*  --   --  1,221*  --   ALKPHOS 265*  --  260*  --   --  780*  --   BILITOT 18.6*  --  18.6*  --   --  15.6*  --    < > = values in this interval not displayed.   PT/INR Recent Labs    August 10, 2020 0329 10-Aug-2020 1204  LABPROT 58.0* 33.0*  INR 6.9* 3.4*  Hepatitis Panel No results for input(s): HEPBSAG, HCVAB, HEPAIGM, HEPBIGM in the last 72 hours.  Studies/Results: CT HEAD WO CONTRAST  Result Date: 07/11/2020 CLINICAL DATA:  Acute neuro deficit, possible stroke EXAM: CT HEAD WITHOUT CONTRAST TECHNIQUE: Contiguous axial images were obtained from the base of the skull through the vertex without intravenous contrast. COMPARISON:  09/10/17 FINDINGS: Brain: Mild atrophic changes are noted. No findings to suggest acute hemorrhage, acute infarction or space-occupying mass lesion are noted. Falx calcification and basal ganglia calcifications are noted. Vascular: No hyperdense vessel or unexpected calcification. Skull: Normal. Negative for fracture or focal lesion. Sinuses/Orbits: No acute finding. Other: None. IMPRESSION: Chronic atrophic changes without acute abnormality. Electronically Signed   By: Inez Catalina M.D.   On: 07/11/2020 21:43   DG Chest Port 1 View  Result Date: 2020/07/14 CLINICAL DATA:  Respiratory failure. EXAM: PORTABLE CHEST 1 VIEW COMPARISON:  07/11/2020. FINDINGS: Endotracheal tube, NG tube, bilateral IJ lines in stable position. Heart size stable. Interim resolution of pulmonary venous congestion. Mild bibasilar atelectasis. Mild persistent interstitial prominence again  noted the lung bases. No pleural effusion or pneumothorax. Left subclavian stent again noted. No acute bony abnormality identified IMPRESSION: 1.  Lines and tubes in stable position. 2. Heart size stable. Interim resolution of pulmonary venous congestion. 3. Mild bibasilar atelectasis. Mild interstitial prominence again noted in the lung bases. Electronically Signed   By: Marcello Moores  Register   On: 07-14-20 06:13   Scheduled Meds: . chlorhexidine gluconate (MEDLINE KIT)  15 mL Mouth Rinse BID  . Chlorhexidine Gluconate Cloth  6 each Topical Q0600  . darbepoetin (ARANESP) injection - DIALYSIS  100 mcg Intravenous Q Wed-HD  . doxercalciferol  7 mcg Intravenous Once per day on Tue Thu Sat  . ketotifen  1 drop Both Eyes BID  . mouth rinse  15 mL Mouth Rinse 10 times per day  . pantoprazole  40 mg Intravenous Q12H  . polyvinyl alcohol  1 drop Both Eyes Daily  . sodium chloride flush  10-40 mL Intracatheter Q12H  . thiamine  100 mg Per Tube Daily   Continuous Infusions: . sodium chloride 250 mL (2020/07/14 0902)  . dextrose 75 mL/hr at 2020/07/14 1400  . feeding supplement (VITAL AF 1.2 CAL) Stopped (Jul 14, 2020 0515)  . [START ON 07/14/2020] fluconazole (DIFLUCAN) IV    . meropenem (MERREM) IV Stopped (07/14/2020 1126)  . methocarbamol (ROBAXIN) IV    . norepinephrine (LEVOPHED) Adult infusion 60 mcg/min (2020-07-14 1400)  . prismasol BGK 0/2.5 2,000 mL/hr at 07-14-20 1326  . prismasol BGK 0/2.5 400 mL/hr at 07/14/20 0809  . prismasol BGK 0/2.5 300 mL/hr at Jul 14, 2020 0809  .  sodium bicarbonate (isotonic) infusion in sterile water 100 mL/hr at 07-14-2020 1400   PRN Meds:.acetaminophen **OR** acetaminophen, fentaNYL (SUBLIMAZE) injection, heparin, methocarbamol (ROBAXIN) IV, sodium chloride flush   ASSESMENT:   *   Choledocholithiasis.  Stone at distal CBD vs distal cystic duct w upstream ductal dilatation per MRCP.  Unsuccessful ERCP 7/18 per Dr Carlean Purl.    *   Acute cholecystitis, mass in GB on 05/2020  imaging.  Aborted lap chole 7/19 due to blood oozing and hypotension.   S/p cholecystostomy tube.  7/21 IR embolization of anterior division R hepatic artery and R hepatic artery.   *   Blood loss anemia.  S/p ~ 15 PRBCs.    *   Critical thrombocytopenia at 16K.  At least 8 packs platelts thus far.    *   Coagulopathy, INT 6.9 >>  3.4.  25 FFP thus far  *    Cirrhosis of liver.  Hep C, s/p treatment. S/p thermoablation HCC.    Acute elevation LFTs, shock liver suspected.  LFTs had trended improved but again rising w exception of down trending t bili.    *   Candida albicans fungemia.  Diflucan day 1 Septic shock.  Leukocytosis.  Hypothermia.  Diflucan and meropenem in place.    *   ESRD, HD pt.  CRRT started 7/22  *   Hyponatremia.    *   Hyperkalemia.         PLAN   *   ? Have advanced biliary MD attempt repeat ERCP and or rendezvous procedure w IR vs have IR place drain/stent to CBD.      Azucena Freed  Aug 11, 2020, 1:14 PM Phone [phone]

## 2020-07-18 NOTE — Consult Note (Signed)
[facility]    Date of Admission:  07/12/2020     Total days of antibiotics 12               Reason for Consult: Candida Albicans Fungemia   Referring Provider: Tivis Ringer / [place] Primary Care Provider: Biagio Borg, MD   ASSESSMENT:  Sarah Harmon is a 74 y/o female in critical condition admitted with choledocholithiasis s/p unsuccessful ERCP and medical management with meropenem with course complicated by encephalopathy requiring intubation, CRRT, and vasopressor support and now fungemia with Candida albicans. Fluconazole added by primary team. Currently on Day 12 of antimicrobial therapy (Day 3 meropenem). Source of fungemia unclear at present time. Agree with addition of fluconazole. Percutaneous drain with bloody drainage. Will continue meropenem as well. Repeat cultures tomorrow for clearance of fungemia. Will need ophthalmologic exam for endophthalmitis when more stable.   PLAN:  1. Continue fluconazole and meropenem. 2. Ventilator management and pressor support per CCM. 3. CRRT per nephrology. 4. Repeat blood cultures in the morning.  5. Ophthalmogic exam for endophthalmitis when more stable.    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 chlorhexidine gluconate (MEDLINE KIT)  15 mL Mouth Rinse BID  . Chlorhexidine Gluconate Cloth  6 each Topical Q0600  . darbepoetin (ARANESP) injection - DIALYSIS  100 mcg Intravenous Q Wed-HD  . doxercalciferol  7 mcg Intravenous Once per day on Tue Thu Sat  . ketotifen  1 drop Both Eyes BID  . mouth rinse  15 mL Mouth Rinse 10 times per day  . pantoprazole  40 mg  Intravenous Q12H  . polyvinyl alcohol  1 drop Both Eyes Daily  . sodium chloride flush  10-40 mL Intracatheter Q12H  . thiamine  100 mg Per Tube Daily     HPI: Sarah Harmon is a 74 y.o. female with previous medical history of ESRD on dialysis (T, Th, Sat), hepatocellular carcinoma, hypertension, Laennec's cirrhosis, and alcohol use admitted with abdominal pain, nausea, and vomiting in the setting of recent ultrasound showing concern for gallbladder neoplasam.   Temperature on admission was 99.9. Lab work significant for hyponatremia of 133, creatinine 3.53, AST 169, ALT 116, Hgb 9.4 and platelet count of 107. MRCP on 7/17 with choledocholithiasis with 1.3 cm distal common stone; gallbladder distention with mild pericholecystic edema suspicious for acute cholecystitis; soft tissue signal within the gallbladder lumen could represent gallbladder sludge, or presence of Doppler signal on ultrasound suggest gallbladder neoplasm; cirrhosis in segment 8 ablation defect; and hemosiderosis. GI consulted and s/p unsuccessful ERCP. Started on meropenem. Intubated on 7/20 for encephalopathy.Has been on and off CRRT and recently restarted on 7/27 for worsening acidosis, hyperkalemia, shock and hypercalcemia.   Sarah Harmon has been afebrile since admission but has elevated temperatures over the last 72 hours with worsening leukocytosis with most recent WBC count of 33. Blood cultures are now positive for Candida albicans fungemia. CRRT restarted today and requiring vasopressor support.   Review of Systems: Review of Systems  Unable to perform ROS: Intubated     Past Medical History:  Diagnosis Date  . ALCOHOL ABUSE 07/13/2010  Quit drinking 2016  . Depression with anxiety 12/22/2009  . 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 DJD (degenerative joint disease), cervical    patient denies on preop of 05/10/15   . ESRD (end stage renal disease) on dialysis ([place])    HD TTS  . GALLSTONES  12/22/2009  . Headache    occasional   . Hepatocellular carcinoma ([place]) 2016   04/2015 RFA.  Marland Kitchen HYPERLIPIDEMIA 12/22/2009  . HYPERTENSION 12/22/2009  . HYPONATREMIA 12/22/2009  . 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 OSTEOPENIA 12/22/2009  . Pancytopenia 07/13/2010  . RENAL CYST, LEFT 12/22/2009  . VITAMIN D DEFICIENCY 12/23/2010  . WEIGHT LOSS 12/23/2010    Social History   Tobacco Use  . Smoking status: Never Smoker  . Smokeless tobacco: Never Used  Vaping Use  . Vaping Use: Never used  Substance Use Topics  . Alcohol use: Not Currently    Alcohol/week: 3.0 - 4.0 standard drinks    Types: 3 - 4 Cans of beer per week    Comment: she quit 02/2015   . Drug use: No    Family History  Problem Relation Age of Onset  . Lung cancer Brother   . Breast cancer Other   . Breast cancer Sister   . Gastric cancer Sister   . Lung cancer Sister   . Colon cancer Neg Hx   . Colon polyps Neg Hx   . Rectal cancer Neg Hx   . Stomach cancer Neg Hx   . Esophageal cancer Neg Hx     No Known Allergies  OBJECTIVE: Blood pressure (!) 77/28, pulse 64, temperature (!) 89.2 F (31.8 C), resp. rate (!) 28, height '5\' 3"'$  (1.6 m), weight 71 kg, SpO2 (!) 85 %.  Physical Exam Constitutional:      General: She is not in acute distress.    Appearance: She is well-developed. She is ill-appearing.     Comments: Lying in bed with head of bed elevated; intubated and not responsive.   Cardiovascular:     Rate and Rhythm: Bradycardia present.     Heart sounds: Normal heart sounds.  Pulmonary:     Effort: Pulmonary effort is normal.     Breath sounds: Normal breath sounds.  Abdominal:     Tenderness: There is no abdominal tenderness.     Comments: Abdomen is firm with decreased bowel sounds. Percutaneous drain with bloody drainage.   Skin:    General: Skin is warm and dry.  Neurological:     Mental Status: She is alert.  Psychiatric:        Thought Content: Thought content normal.        Judgment: Judgment normal.      Lab Results Lab Results  Component Value Date   WBC 33.0 (H) 08-09-2020   HGB 8.5 (L) 09-Aug-2020   HCT 25.0 (L) 08/09/2020   MCV 104.2 (H) 08/09/2020   PLT 16 (LL) August 09, 2020    Lab Results  Component Value Date   CREATININE 3.38 (H) 08-09-20   BUN 14 08-09-20   NA 120 (L) 08/09/20   K 7.3 (HH) 08-09-20   CL 95 (L) August 09, 2020   CO2 <7 (L) 09-Aug-2020    Lab Results  Component Value Date   ALT 1,221 (H) 08-09-2020   AST 2,068 (H) 2020/08/09   ALKPHOS 780 (H) 08/09/2020   BILITOT 15.6 (H) 09-Aug-2020     Microbiology: Recent Results (from the past 240 hour(s))  SARS Coronavirus 2 by RT PCR (hospital  order, performed in [facility] hospital lab) Nasopharyngeal Nasopharyngeal Swab     Status: None   Collection Time: 06/27/2020 10:14 PM   Specimen: Nasopharyngeal Swab  Result Value Ref Range Status   SARS Coronavirus 2 NEGATIVE NEGATIVE Final    Comment: (NOTE) SARS-CoV-2 target nucleic acids are NOT DETECTED.  The SARS-CoV-2 RNA is generally detectable in upper and lower respiratory specimens during the acute phase of infection. The lowest concentration of SARS-CoV-2 viral copies this assay can detect is 250 copies / mL. A negative result does not preclude SARS-CoV-2 infection and should not be used as the sole basis for treatment or other patient management decisions.  A negative result may occur with improper specimen collection / handling, submission of specimen other than nasopharyngeal swab, presence of viral mutation(s) within the areas targeted by this assay, and inadequate number of viral copies (<250 copies / mL). A negative result must be combined with clinical observations, patient history, and epidemiological information.  Fact Sheet for Patients:   StrictlyIdeas.no  Fact Sheet for Healthcare Providers: BankingDealers.co.za  This test is not yet approved or  cleared by the [place] FDA  and has been authorized for detection and/or diagnosis of SARS-CoV-2 by FDA under an Emergency Use Authorization (EUA).  This EUA will remain in effect (meaning this test can be used) for the duration of the COVID-19 declaration under Section 564(b)(1) of the Act, 21 U.S.C. section 360bbb-3(b)(1), unless the authorization is terminated or revoked sooner.  Performed at [facility], [place] [address], [place], [place] [address]   Culture, blood (routine x 2)     Status: None   Collection Time: 07/17/2020 12:10 AM   Specimen: BLOOD RIGHT HAND  Result Value Ref Range Status   Specimen Description BLOOD RIGHT HAND  Final   Special Requests   Final    BOTTLES DRAWN AEROBIC ONLY Blood Culture results may not be optimal due to an inadequate volume of blood received in culture bottles   Culture   Final    NO GROWTH 5 DAYS Performed at [facility], [place] [address], [place], [place] [address]    Report Status 07/09/2020 FINAL  Final  Culture, blood (routine x 2)     Status: None   Collection Time: 06/20/2020 12:36 AM   Specimen: BLOOD RIGHT HAND  Result Value Ref Range Status   Specimen Description BLOOD RIGHT ARM  Final   Special Requests   Final    BOTTLES DRAWN AEROBIC ONLY Blood Culture results may not be optimal due to an inadequate volume of blood received in culture bottles   Culture   Final    NO GROWTH 5 DAYS Performed at [facility], [place] [address] [place], [place] [address]    Report Status 07/09/2020 FINAL  Final  MRSA PCR Screening     Status: None   Collection Time: 06/23/2020  7:03 AM   Specimen: Nasopharyngeal  Result Value Ref Range Status   MRSA by PCR NEGATIVE NEGATIVE Final    Comment:        The GeneXpert MRSA Assay (FDA approved for NASAL specimens only), is one component of a comprehensive MRSA colonization surveillance program. It is not intended to diagnose MRSA infection nor to guide or monitor treatment for MRSA infections. Performed at  [facility], [place] [address] [place], [place] [address]   Culture, blood (routine x 2)     Status: Abnormal (Preliminary result)   Collection Time:  07/11/20  4:32 PM   Specimen: BLOOD  Result Value Ref Range Status   Specimen Description BLOOD RIGHT ANTECUBITAL  Final   Special Requests   Final    BOTTLES DRAWN AEROBIC AND ANAEROBIC Blood Culture adequate volume   Culture  Setup Time (A)  Final    YEAST AEROBIC BOTTLE ONLY CRITICAL VALUE NOTED.  VALUE IS CONSISTENT WITH PREVIOUSLY REPORTED AND CALLED VALUE.    Culture   Final    NO GROWTH < 24 HOURS Performed at [facility], [place] [address] [place], [place] [address]    Report Status PENDING  Incomplete  Culture, blood (routine x 2)     Status: Abnormal (Preliminary result)   Collection Time: 07/11/20  4:34 PM   Specimen: BLOOD  Result Value Ref Range Status   Specimen Description BLOOD CENTRAL LINE  Final   Special Requests   Final    BOTTLES DRAWN AEROBIC AND ANAEROBIC Blood Culture adequate volume   Culture  Setup Time (A)  Final    YEAST AEROBIC BOTTLE ONLY Organism ID to follow PHARMD LAURA SEAY AT [place] ON 08/02/20 Performed at [facility], [address], [place], [place] [address]    Culture PENDING  Incomplete   Report Status PENDING  Incomplete  Blood Culture ID Panel (Reflexed)     Status: Abnormal   Collection Time: 07/11/20  4:34 PM  Result Value Ref Range Status   Enterococcus species NOT DETECTED NOT DETECTED Final   Listeria monocytogenes NOT DETECTED NOT DETECTED Final   Staphylococcus species NOT DETECTED NOT DETECTED Final   Staphylococcus aureus (BCID) NOT DETECTED NOT DETECTED Final   Streptococcus species NOT DETECTED NOT DETECTED Final   Streptococcus agalactiae NOT DETECTED NOT DETECTED Final   Streptococcus pneumoniae NOT DETECTED NOT DETECTED Final   Streptococcus pyogenes NOT DETECTED NOT DETECTED Final   Acinetobacter baumannii NOT DETECTED NOT  DETECTED Final   Enterobacteriaceae species NOT DETECTED NOT DETECTED Final   Enterobacter cloacae complex NOT DETECTED NOT DETECTED Final   Escherichia coli NOT DETECTED NOT DETECTED Final   Klebsiella oxytoca NOT DETECTED NOT DETECTED Final   Klebsiella pneumoniae NOT DETECTED NOT DETECTED Final   Proteus species NOT DETECTED NOT DETECTED Final   Serratia marcescens NOT DETECTED NOT DETECTED Final   Haemophilus influenzae NOT DETECTED NOT DETECTED Final   Neisseria meningitidis NOT DETECTED NOT DETECTED Final   Pseudomonas aeruginosa NOT DETECTED NOT DETECTED Final   Candida albicans DETECTED (A) NOT DETECTED Final    Comment: CRITICAL RESULT CALLED TO, READ BACK BY AND VERIFIED WITH: PHARMD LAURA SEAY AT 0442 MY MESSAN HOUEGNIFIO ON 2020-08-02    Candida glabrata NOT DETECTED NOT DETECTED Final   Candida krusei NOT DETECTED NOT DETECTED Final   Candida parapsilosis NOT DETECTED NOT DETECTED Final   Candida tropicalis NOT DETECTED NOT DETECTED Final    Comment: Performed at [facility], [address], [place], [place] [address]     Terri Piedra, [facility]  2020/08/02  12:05 PM

## 2020-07-18 NOTE — Progress Notes (Addendum)
8 Days Post-Op   Subjective/Chief Complaint: Remains intubated Worsening metabolic acidosis and coagulopathy overnight, now on 16 mcg levo and getting an amp of bicarb   Objective: Vital signs in last 24 hours: Temp:  [93.7 F (34.3 C)-99.1 F (37.3 C)] 97.2 F (36.2 C) (07/27 0700) Pulse Rate:  [35-80] 69 (07/27 0341) Resp:  [17-35] 30 (07/27 0700) BP: (71-126)/(13-74) 100/33 (07/27 0341) SpO2:  [91 %-100 %] 96 % (07/27 0600) Arterial Line BP: (82-166)/(30-50) 166/40 (07/27 0700) FiO2 (%):  [30 %-40 %] 40 % (07/27 0400) Weight:  [71 kg] 71 kg (07/27 0405) Last BM Date: 07/12/20  Intake/Output from previous day: 07/26 0701 - 07/27 0700 In: 2486.2 [I.V.:2104.7; NG/GT:165; IV Piggyback:216.5] Out: 8 [Drains:8] Intake/Output this shift: No intake/output data recorded.  Exam: Intubated, minimally responsive this morning Abdomen full, JP and drain in gallbladder with bloody fluid, some bile in drain ` Cholecystostomy tube: 0 cc/24h SS  R medial JP: 8 cc/24h SS  NG: 0 cc/24h bilious  Rectal tube in place with liquid stool in pouch  Lab Results:  Recent Labs    07/12/20 0449 07/12/20 0449 Jul 29, 2020 0329 Jul 29, 2020 0606  WBC 25.8*  --  33.0*  --   HGB 9.1*   < > 8.6* 8.5*  HCT 29.4*   < > 29.6* 25.0*  PLT 24*  --  16*  --    < > = values in this interval not displayed.   BMET Recent Labs    07/29/20 0329 07/29/2020 0329 2020-07-29 0544 07/29/20 0606  NA 121*   < > 122* 120*  K 6.9*   < > >7.5* 7.3*  CL 94*  --  95*  --   CO2 8*  --  <7*  --   GLUCOSE 123*  --  128*  --   BUN 14  --  14  --   CREATININE 3.17*  --  3.38*  --   CALCIUM >15.0*  --  >15.0*  --    < > = values in this interval not displayed.   PT/INR Recent Labs    07/12/20 0449 2020/07/29 0329  LABPROT 33.1* 58.0*  INR 3.4* 6.9*   ABG Recent Labs    07/29/20 0606  PHART 7.092*  HCO3 7.3*    Studies/Results: DG Abd 1 View  Result Date: 07/11/2020 CLINICAL DATA:  Evaluate for ileus. EXAM:  ABDOMEN - 1 VIEW COMPARISON:  06/20/2020 FINDINGS: Nasogastric tube unchanged with tip over the stomach in the left upper quadrant. Catheter over the right abdomen with tip over the right upper quadrant unchanged. Bowel gas pattern is nonobstructive. No free peritoneal air. Multiple embolization coils over the right upper quadrant. Surgical skin staples over the right upper quadrant and right lower quadrant. Remainder the exam is unremarkable. IMPRESSION: 1.  Nonobstructive bowel gas pattern. 2.  Tubes and lines as described. Electronically Signed   By: Marin Olp M.D.   On: 07/11/2020 12:55   CT HEAD WO CONTRAST  Result Date: 07/11/2020 CLINICAL DATA:  Acute neuro deficit, possible stroke EXAM: CT HEAD WITHOUT CONTRAST TECHNIQUE: Contiguous axial images were obtained from the base of the skull through the vertex without intravenous contrast. COMPARISON:  09/10/17 FINDINGS: Brain: Mild atrophic changes are noted. No findings to suggest acute hemorrhage, acute infarction or space-occupying mass lesion are noted. Falx calcification and basal ganglia calcifications are noted. Vascular: No hyperdense vessel or unexpected calcification. Skull: Normal. Negative for fracture or focal lesion. Sinuses/Orbits: No acute finding. Other: None. IMPRESSION: Chronic  atrophic changes without acute abnormality. Electronically Signed   By: Inez Catalina M.D.   On: 07/11/2020 21:43   DG Chest Port 1 View  Result Date: 07/20/2020 CLINICAL DATA:  Respiratory failure. EXAM: PORTABLE CHEST 1 VIEW COMPARISON:  07/11/2020. FINDINGS: Endotracheal tube, NG tube, bilateral IJ lines in stable position. Heart size stable. Interim resolution of pulmonary venous congestion. Mild bibasilar atelectasis. Mild persistent interstitial prominence again noted the lung bases. No pleural effusion or pneumothorax. Left subclavian stent again noted. No acute bony abnormality identified IMPRESSION: 1.  Lines and tubes in stable position. 2. Heart size  stable. Interim resolution of pulmonary venous congestion. 3. Mild bibasilar atelectasis. Mild interstitial prominence again noted in the lung bases. Electronically Signed   By: Marcello Moores  Register   On: 07/20/20 06:13    Anti-infectives: Anti-infectives (From admission, onward)   Start     Dose/Rate Route Frequency Ordered Stop   07-20-2020 2000  meropenem (MERREM) 500 mg in sodium chloride 0.9 % 100 mL IVPB     Discontinue     500 mg 200 mL/hr over 30 Minutes Intravenous Every 24 hours 07/12/20 0942     07/20/2020 1800  vancomycin (VANCOREADY) IVPB 750 mg/150 mL  Status:  Discontinued        750 mg 150 mL/hr over 60 Minutes Intravenous Every T-Th-Sa (1800) 07/11/20 2201 07/12/20 0907   07/20/20 0600  fluconazole (DIFLUCAN) IVPB 800 mg  Status:  Discontinued        800 mg 200 mL/hr over 120 Minutes Intravenous Every 24 hours 07-20-20 0502 2020-07-20 0503   07-20-2020 0600  fluconazole (DIFLUCAN) IVPB 800 mg     Discontinue     800 mg 100 mL/hr over 240 Minutes Intravenous  Once 2020/07/20 0505     07/20/2020 0500  fluconazole (DIFLUCAN) IVPB 800 mg  Status:  Discontinued        800 mg 100 mL/hr over 240 Minutes Intravenous  Once 2020/07/20 0454 20-Jul-2020 1050   07/12/20 1300  meropenem (MERREM) 1 g in sodium chloride 0.9 % 100 mL IVPB  Status:  Discontinued        1 g 200 mL/hr over 30 Minutes Intravenous Every 24 hours 07/11/20 2201 07/12/20 0942   07/12/20 1300  meropenem (MERREM) 500 mg in sodium chloride 0.9 % 100 mL IVPB        500 mg 200 mL/hr over 30 Minutes Intravenous  Once 07/12/20 0942 07/12/20 1243   07/11/20 1400  meropenem (MERREM) 1 g in sodium chloride 0.9 % 100 mL IVPB  Status:  Discontinued        1 g 200 mL/hr over 30 Minutes Intravenous Every 8 hours 07/11/20 1115 07/11/20 2201   07/11/20 1400  vancomycin (VANCOREADY) IVPB 750 mg/150 mL  Status:  Discontinued        750 mg 150 mL/hr over 60 Minutes Intravenous Every 24 hours 07/11/20 1115 07/11/20 2201   07/08/20 1800   piperacillin-tazobactam (ZOSYN) IVPB 3.375 g  Status:  Discontinued        3.375 g 100 mL/hr over 30 Minutes Intravenous Every 6 hours 07/08/20 1045 07/11/20 1101   07/07/20 2200  piperacillin-tazobactam (ZOSYN) IVPB 3.375 g  Status:  Discontinued        3.375 g 100 mL/hr over 30 Minutes Intravenous Every 12 hours 07/07/20 0841 07/08/20 1045   06/23/2020 1300  piperacillin-tazobactam (ZOSYN) IVPB 2.25 g  Status:  Discontinued        2.25 g 100 mL/hr over 30 Minutes  Intravenous Every 8 hours 06/17/2020 0835 06/24/2020 1120   07/17/2020 1300  piperacillin-tazobactam (ZOSYN) IVPB 3.375 g  Status:  Discontinued        3.375 g 100 mL/hr over 30 Minutes Intravenous Every 6 hours 07/07/2020 1120 07/07/20 0841   06/23/2020 1200  piperacillin-tazobactam (ZOSYN) IVPB 3.375 g  Status:  Discontinued        3.375 g 100 mL/hr over 30 Minutes Intravenous Every 6 hours 07/04/2020 0652 06/21/2020 0835   07/10/2020 0430  piperacillin-tazobactam (ZOSYN) IVPB 2.25 g  Status:  Discontinued        2.25 g 100 mL/hr over 30 Minutes Intravenous Every 8 hours 07/12/2020 2147 07/14/2020 0652   06/29/2020 2000  piperacillin-tazobactam (ZOSYN) IVPB 3.375 g        3.375 g 12.5 mL/hr over 240 Minutes Intravenous  Once 07/15/2020 1957 07/02/2020 2328      Assessment/Plan: HTN HLD Depression Anxiety Hepatocellular carcinoma Dx 2016 s/p thermoablation ESRD on 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 Georgette Dover 7/21 IR embolization of right hepatic artery  - remains intubated with increasing pressor requirement, CRRT being resumed today,  - worsening coagulopathy, INR 6.9, plts 16. CMP w/ bili 15.6, AST 2,068, ALT 1,221, AP 780  Remains critically ill and unstable. Ultimately needs re-attempted ERCP to relieve obstruction vs IR biliary decompression w/ PCT. Will discuss with both GI  and IR today. She is not a candidate for surgery and a common bile duct exploration in her condition.   LOS: 10 days    Jill Alexanders 07-28-20

## 2020-07-18 NOTE — Progress Notes (Signed)
Patient ID: Sarah Harmon, female   DOB: [birth year], 74 y.o.   MRN: [id number] [facility] Progress Note   Assessment/ Plan:   1.  Acute cholecystitis with choledocholithiasis: Status post attempted laparoscopic cholecystectomy after unsuccessful ERCP complicated by bleeding from pancreaticoduodenal artery with percutaneous coil embolization by IR on 7/21.  With cholecystostomy tube to decompress gallbladder and plans for transhepatic drain when stable.  Unfortunately, prognosis at this point appears to indicate imminent mortality. 2. ESRD: She is usually on a TTS dialysis schedule and will now be started on on CRRT for multiple metabolic abnormalities in the setting of hemodynamic instability.  Poor outlook. 3. Anemia: With anemia of end-stage renal disease complicated by acute blood loss anemia postoperatively.  Status post PRBC transfusions, monitor H&H as well as platelet trends. 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 Nutrition: Ongoing tube feeds.  Continue to monitor albumin/prealbumin. 6. Hypotension: Remains pressor dependent with profound metabolic acidosis-monitor on CRRT. 7.  Hyponatremia: Secondary to hypotonic fluid supplementation with D10W at 75 cc an hour.  Monitor with CRRT.  Subjective:   Increasing pressor requirements noted overnight awaiting to restart CRRT after labs showed persistent hyperkalemia, profound metabolic acidosis poorly explained hypercalcemia.   Objective:   BP (!) 100/33   Pulse 69   Temp (!) 97.2 F (36.2 C)   Resp (!) 30   Ht _0  (1.6 m)   Wt 71 kg   SpO2 96%   BMI 27.73 kg/m   Physical Exam: Gen: Intubated, sedated, appears comfortable. CVS: Pulse regular rhythm, normal rate, S1 and S2 normal Resp: Clear to auscultation bilaterally, no distinct rales.  Right IJ temporary  dialysis catheter. Abd: Soft, flat, right upper quadrant drain with hemorrhagic fluid. Ext: No lower extremity edema.  Left upper arm AV graft pulsatile  Labs: BMET Recent Labs  Lab 07/09/20 1600 07/09/20 1600 07/10/20 0513 07/10/20 0513 07/10/20 1510 07/11/20 0445 07/11/20 1517 07/12/20 0449 08/02/20 0329 08/02/20 0544 08-02-20 0606  NA 135   < > 134*   < > 134* 134* 133* 129* 121* 122* 120*  K 3.8   < > 3.7   < > 3.7 3.9 4.4 4.5 6.9* >7.5* 7.3*  CL 98   < > 98  --  98 99 100 97* 94* 95*  --   CO2 22   < > 24  --  20* 20* 21* 15* 8* <7*  --   GLUCOSE 132*   < > 120*  --  141* 123* 90 174* 123* 128*  --   BUN 9   < > 7*  --  7* <5* 5* _1 --   CREATININE 1.75*   < > 1.23*  --  1.01* 1.01* 1.01* 1.97* 3.17* 3.38*  --   CALCIUM 10.0   < > 9.5  --  9.9 10.1 10.1 12.3* >15.0* >15.0*  --   PHOS 3.4  --  2.2*  --  2.3* 2.7 3.4 5.7*  --  >12.0*  --    < > = values in this interval not displayed.   CBC Recent Labs  Lab 07/08/20 2342 07/09/20 0058 07/10/20 1510 07/10/20 1510 07/11/20 0445 07/12/20 0449 2020-08-02 0329 08-02-2020 0606  WBC 14.8*   < > 9.1  --  16.4* 25.8* 33.0*  --   NEUTROABS 13.7*  --  10.3*  --  13.9*  --   --   --  HGB 7.8*   < > 9.6*   < > 9.5* 9.1* 8.6* 8.5*  HCT 24.7*   < > 29.7*   < > 29.0* 29.4* 29.6* 25.0*  MCV 91.1   < > 92.5  --  93.2 97.0 104.2*  --   PLT 86*   < > 37*  --  58* 24* 16*  --    < > = values in this interval not displayed.     Medications:    . sodium chloride   Intravenous Once  . chlorhexidine  15 mL Mouth/Throat Once  . chlorhexidine gluconate (MEDLINE KIT)  15 mL Mouth Rinse BID  . Chlorhexidine Gluconate Cloth  6 each Topical Q0600  . darbepoetin (ARANESP) injection - DIALYSIS  100 mcg Intravenous Q Wed-HD  . doxercalciferol  7 mcg Intravenous Once per day on Tue Thu Sat  . ketotifen  1 drop Both Eyes BID  . mouth rinse  15 mL Mouth Rinse 10 times per day  . pantoprazole  40 mg Intravenous Q12H  . polyvinyl alcohol   1 drop Both Eyes Daily  . sodium bicarbonate  100 mEq Intravenous Once  . sodium chloride flush  10-40 mL Intracatheter Q12H  . thiamine  100 mg Per Tube Daily   Elmarie Shiley, MD 2020-08-05, 8:12 AM

## 2020-07-18 NOTE — Progress Notes (Addendum)
Pharmacy Antibiotic Note  Sarah Harmon is a 74 y.o. female admitted on 06/18/2020 with cholelithiasis s/p complex cholecystectomy with continued CBD obstruction.  Pharmacy has been consulted for meropenem dosing.  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  Restarting CRRT today, afebrile, WBC up to 33, LA > 11.  Plan: Adjust Merrem to 1gm IV Q8H Schedule fluconazole 800mg  IV Q24H Monitor CRRT tolerance/interruptions, clinical progress  Height: 5\' 3"  (160 cm) Weight: 71 kg (156 lb 8.4 oz) IBW/kg (Calculated) : 52.4  Temp (24hrs), Avg:96.9 F (36.1 C), Min:93.7 F (34.3 C), Max:99.1 F (37.3 C)  Recent Labs  Lab 07/10/20 0513 07/10/20 0513 07/10/20 1510 07/10/20 1510 07/11/20 0445 07/11/20 1517 07/12/20 0449 10-Aug-2020 0329 08-10-20 0544  WBC 11.6*  --  9.1  --  16.4*  --  25.8* 33.0*  --   CREATININE 1.23*   < > 1.01*   < > 1.01* 1.01* 1.97* 3.17* 3.38*   < > = values in this interval not displayed.    Estimated Creatinine Clearance: 13.6 mL/min (A) (by C-G formula based on SCr of 3.38 mg/dL (H)).    No Known Allergies  Zosyn 7/17 >> 7/25 Merrem 7/25>> Vanc 7/25>>7/26 Fluc 7/27 >>   7/18 BCx - negative 7/18 MRSA PCR - negative 7/25 BCx - yeast (C.albicans)  Sarah Harmon D. Mina Marble, PharmD, BCPS, [place] 08-10-20, 8:50 AM

## 2020-07-18 NOTE — Progress Notes (Signed)
PHARMACY - PHYSICIAN COMMUNICATION CRITICAL VALUE ALERT - BLOOD CULTURE IDENTIFICATION (BCID)  Sarah Harmon is an 74 y.o. female who presented to [facility] on 07/10/2020 with a chief complaint of sepsis  Assessment:  2/2 BC with C albicans  Name of physician (or Provider) Contacted: Levada Schilling  Current antibiotics: meropenem  Changes to prescribed antibiotics recommended:  Give loading dose of fluconazole 800 mg IV now.  F/u with ID recommendations  Results for orders placed or performed during the hospital encounter of 06/21/2020  Blood Culture ID Panel (Reflexed) (Collected: 07/11/2020  4:34 PM)  Result Value Ref Range   Enterococcus species NOT DETECTED NOT DETECTED   Listeria monocytogenes NOT DETECTED NOT DETECTED   Staphylococcus species NOT DETECTED NOT DETECTED   Staphylococcus aureus (BCID) NOT DETECTED NOT DETECTED   Streptococcus species NOT DETECTED NOT DETECTED   Streptococcus agalactiae NOT DETECTED NOT DETECTED   Streptococcus pneumoniae NOT DETECTED NOT DETECTED   Streptococcus pyogenes NOT DETECTED NOT DETECTED   Acinetobacter baumannii NOT DETECTED NOT DETECTED   Enterobacteriaceae species NOT DETECTED NOT DETECTED   Enterobacter cloacae complex NOT DETECTED NOT DETECTED   Escherichia coli NOT DETECTED NOT DETECTED   Klebsiella oxytoca NOT DETECTED NOT DETECTED   Klebsiella pneumoniae NOT DETECTED NOT DETECTED   Proteus species NOT DETECTED NOT DETECTED   Serratia marcescens NOT DETECTED NOT DETECTED   Haemophilus influenzae NOT DETECTED NOT DETECTED   Neisseria meningitidis NOT DETECTED NOT DETECTED   Pseudomonas aeruginosa NOT DETECTED NOT DETECTED   Candida albicans DETECTED (A) NOT DETECTED   Candida glabrata NOT DETECTED NOT DETECTED   Candida krusei NOT DETECTED NOT DETECTED   Candida parapsilosis NOT DETECTED NOT DETECTED   Candida tropicalis NOT DETECTED NOT DETECTED    Beverlee Nims 08-11-2020  4:57 AM

## 2020-07-18 NOTE — Death Summary Note (Addendum)
Sarah Harmon was a 74 y.o. female admitted on 06/19/2020 with nausea, vomiting, and abdominal pain.  Found to have acute cholecystitis with choledocholithiasis.  Seen by GI and surgery.  Had unsuccessful ERCP.  Then had attempted laparoscopic cholecystectomy.  Developed hypotension during surgery from bleeding and coagulopathy.  Required large volume transfusion with PRBC, FFP, and PLT.  Remained on pressors for septic shock.  Required intubation.  Developed renal failure and started on CRRT.  She was able to transition off CRRT transiently.  Then developed significant worsening of shock with acidosis and hyperkalemia.  CRRT restarted.  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  Seen by ID.  Antifungal therapy started.  Family opted for DNR status.  Medical status continued to decline.  She subsequently expired on 07/25/2020 at 2100.  Final diagnosis: Candida albicans fungemia Septic shock Acute cholecystitis with choledocholithiasis Acute hypoxic respiratory failure in setting of septic shock Anion gap metabolic acidosis with lactic acidosis Acute metabolic encephalopathy from sepsis Alcohol cirrhosis History of hepatocellular carcinoma Elevated LFTs from shock ESRD Hyperkalemia Hypercalcemia Hyponatermia Anemia from blood loss during surgery Anemia of critical illness and chronic disease Thrombocytopenia from sepsis and cirrhosis Coagulopathy from cirrhosis Hypoglycemia in setting of sepsis Moderate protein calorie malnutrition  Chesley Mires, MD [place] Pager - [phone] 07/14/2020, 5:51 PM

## 2020-07-18 NOTE — Progress Notes (Signed)
[place] Progress Note Patient Name: KAYA POTTENGER DOB: Mar 02, 1946 MRN: [id number]   Date of Service  07/15/20  HPI/Events of Note  Multiple issues: 1. Hypotension - Increasing Norepinephrine IV infusion requirements, 2. K+ 6.9, 3. Na+ = 121 and 4. Ca++ > 15.   eICU Interventions  Plan: 1. Increase ceiling on Norepinephrine IV infusion to 60 mcg/min. 2. ABG STAT. 3. Lokelma 10 gm per tube now. 4. Nursing to contact Nephrology concerning hyperkalemia, Hypercalcemia, Hyponatremia and metabolic acidosis.      Intervention Category Major Interventions: Electrolyte abnormality - evaluation and management;Hypotension - evaluation and management  Julien Oscar Cornelia Copa 07-15-2020, 5:27 AM

## 2020-07-18 NOTE — Progress Notes (Signed)
[place] Progress Note Patient Name: Sarah Harmon DOB: 1946-08-05 MRN: [id number]   Date of Service  2020-07-28  HPI/Events of Note  INR = 6.9.   eICU Interventions  Plan: 1. Vitamin K 5 mg IV now. 2. Transfuse 3 units FFP .  3. Repeat INR at 12 Noon.      Intervention Category Major Interventions: Other:  Lysle Dingwall 07-28-20, 5:51 AM

## 2020-07-18 NOTE — Progress Notes (Signed)
NAME:  Sarah Harmon, MRN:  [id number], DOB:  1946-03-24, LOS: 10 ADMISSION DATE:  06/22/2020, CONSULTATION DATE:  06/28/2020 REFERRING MD: Kayleen Memos, DO CHIEF COMPLAINT:  Hemorrhagic shock   Brief History   PCCM consulted for admission to ICU after complicated cholecystectomy. Patient had Abnormal blood loss and needing massive transfusion.   Past Medical History  HTN HLD Depression Anxiety Hepatocellular carcinoma dx 2016 s/p themoablation ESRD on HD TTH S Alcoholic liver cirrhosis   Significant Hospital Events   7/17 Admit 7/17 MRCP 7/18 ERCP unsuccessful 7/19 Attempted laparoscopic cholecystectomy with placement of percutaneous cholecystostomy tube; aborted intraoperatively due to intra-op hypotension, bleeding and coagulopathy. As of 7/19 had received 7 u PRBC, 2 FFP and 1 plt. Still on high pressors.  7/20 intubated. Still in sig shock (in spite of more blood products (up to 9 units PRBC), three pressors and bicarb). Made DNR. Did not tol iHD. Plan to start CRRT.  7/21: Hgb cont to drop. 2 more PRBC.  CT abd/pelvis obtained. Reviewed by surgery: "CT are not unexpected and the patient's hemodynamics, vasopressor requirement, and response to blood transfusion do not suggest ongoing blood loss that would necessitate laparotomy. Recommend continued balanced resuscitation guided by TEG and maintenance of normothermia. Will continue to follow". Could not get HD cath to work for CRRT. Off pressors. Seems volume responsive. PLTs 51. Holding stable. INR 2.1. . Examination is positive for area of active arterial extravasation adjacent to the gallbladder fossa with associated pooling within the right upper abdomen likely supplied several branches of the anterior division of the right hepatic artery as well as potential supply from the pancreaticoduodenal branch of the GDA. Marland Kitchen  Having bloody stool went for CT angiogram underwent arteriogram with percutaneous coil embolization of the  pancreatic duodenal artery and GDA as well as embolization of the right hepatic artery. 7/22 hemodynamics improved starting to have episodes of hypoglycemia with glucose as low as 30 D10 infusion initiated still coagulopathic getting FFP.  Ventilator mechanics demonstrating concern for transfusion related lung injury, and protected tidal volume ventilation initiated.  CRRT reinitiated. 7/23: Off pressors.  Tolerating CRRT.  Interactive .  Both JP and cholecystostomy drain with serosanguineous drainage more bloody than serous.  Now pending transfer to radiology for cholangiogram through cholecystostomy tube.  Hemoglobin sitting at 7.1 down from 7.81-day ago 7/24: On CRRT.   7/27 worsening acidosis, hyperkalemia, shock, hypercalcemia >> restarted CRRT  Consults:  Nephrology GI Surgery IR  Procedures:  7/18 ERCP 7/19 Lap Chole  7/20 intubated for encephalopathy 7/20 trialysis catheter placement 7/21 IR embolization  Significant Diagnostic Tests:  7/17 MRCP :  Choledocholithiasis, with a 1.3 cm distal common duct stone. Gallbladder distension with mild pericholecystic edema. Findings are suspicious for acute cholecystitis. 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 Cirrhosis and segment 8 ablation defect, suboptimally evaluated without gross recurrent or metachronous hepatocellular carcinoma Hemosiderosis.Trace abdominal ascites. Similar upper pole left renal complex cystic lesion, incompletely 7/21 CT abd/pelvis: 1. Complex high attenuation fluid throughout the abdomen and pelvis consistent with hemoperitoneum.  unclear whether this represents acute hemorrhage, or residual from prior hemorrhage at the time of laparoscopy. Flattened inferior vena cava which may reflect hypovolemia. Postsurgical changes from recent laparoscopy, with punctate foci of free gas in the upper abdomen.  Percutaneous drains within the right upper quadrant and along the dome of the liver. Small bilateral pleural effusions. 7/21: 1. Examination is positive for  area of active arterial extravasation adjacent to the gallbladder fossa with associated pooling within the right upper abdomen likely supplied several branches of the anterior division of the right hepatic artery as well as potential supply from the pancreaticoduodenal branch of the GDA. 2. Multiple mixed attenuating fluid collections are seen throughout the abdomen and pelvis, nearly all of which contain hematocrit levels, compatible with of evolving hematomas. 3. Scattered atherosclerotic plaque within normal caliber abdominal aorta.4. Suspected approximately 50% luminal narrowing involving theorigin the celiac artery.  Micro Data:  7/18 blood>> negative 7/25 blood >> Candida albicans  Antimicrobials:  Zosyn 7/17 >> 7/25 Meropenem 7/25 >>  Fluconazole 7/27 >>   Interim history/subjective:  Increasing pressor needs.  Restarting CRRT.  Objective   Blood pressure (!) 100/33, pulse 81, temperature (!) 97.2 F (36.2 C), resp. rate (!) 36, height 5\' 3"  (1.6 m), weight 71 kg, SpO2 96 %. CVP:  [8 mmHg-11 mmHg] 11 mmHg  Vent Mode: PRVC FiO2 (%):  [30 %-40 %] 40 % Set Rate:  [20 bmp] 20 bmp Vt Set:  [360 mL] 360 mL PEEP:  [8 cmH20] 8 cmH20 Plateau Pressure:  [20 cmH20-26 cmH20] 26 cmH20   Intake/Output Summary (Last 24 hours) at 07-25-2020 0848 Last data filed at July 25, 2020 0800 Gross per 24 hour  Intake 2679.38 ml  Output 8 ml  Net 2671.38 ml   Filed Weights   07/11/20 0500 07/12/20 0405 07/25/2020 0405  Weight: 65.2 kg 63.9 kg 71 kg    Examination:  General - ill appearing Eyes - pupils reactive ENT - ETT in place Cardiac - irregular, bradycardic Chest - no wheeze Abdomen - distended, increased tympany, no bowel sounds appreciated Extremities - 2+ edema Skin - no rashes Neuro - RASS -2  Resolved Hospital Problem list       Assessment & Plan:   Acute respiratory failure with hypoxia in setting of septic shock. - full vent support - f/u CXR, ABG  Septic shock. - from gallbladder, Candidal fungemia - pressors to keep MAP > 65 - add fluconazole 7/27 - day 3 of meropenem  Choledocholithiasis with acute cholecystitis. - ERCP on 7/18 >> failed attempt at cholangiogram, no papillary orifice seen, distorted anatomy - attempted laparoscopy cholecystectomy 7/20 >> complicated by intraoperative hemorrhage with shock - too high risk for return to OR or IR at this time - check lipase  Lactic acidosis. - concerned she has developed ischemia bowel versus pancreatitis on 9/47  Acute metabolic encephalopathy 2nd to sepsis. - RASS goal 0 to -1  Hx of ETOH cirrhosis, hepatocellular carcinoma. Elevated LFTs in setting of shock. - f/u LFTs  ESRD. Metabolic acidosis with lactic acidosis, hyperkalemia, hypercalcemia. Hyponatremia. - restarting CRRT 7/27 - f/u BMET, ABG, Lactic acid  Anemia from blood loss, critical illness, and chronic disease. Thrombocytopenia. Coagulopathy. - likely from sepsis, hx of cirrhosis - f/u CBC - transfuse for Hb < 7, PLT < 10, or significant bleeding  Hypoglycemia. - continue dextrose in IV fluid  Goals of care. - spoke with pt's daughter by phone on 7/27 - explained current medical condition and that chances for recover are grim - continue current medical therapies - DNR if she develops cardiac arrest  Best practice:  Diet: NPO DVT prophylaxis: SCDs GI prophylaxis: pantoprazole Mobility: Bed rest Code Status: Full Disposition: ICU  Labs    CMP Latest Ref Rng & Units July 25, 2020 Jul 25, 2020 25-Jul-2020  Glucose 70 - 99 mg/dL - 128(H) 123(H)  BUN 8 - 23 mg/dL - 14  14  Creatinine 0.44 - 1.00 mg/dL - 3.38(H) 3.17(H)  Sodium 135 - 145 mmol/L 120(L) 122(L) 121(L)  Potassium 3.5 - 5.1 mmol/L 7.3(HH) >7.5(HH) 6.9(HH)  Chloride 98 - 111 mmol/L - 95(L) 94(L)  CO2 22 - 32  mmol/L - <7(L) 8(L)  Calcium 8.9 - 10.3 mg/dL - >15.0(HH) >15.0(HH)  Total Protein 6.5 - 8.1 g/dL - - 4.8(L)  Total Bilirubin 0.3 - 1.2 mg/dL - - 15.6(H)  Alkaline Phos 38 - 126 U/L - - 780(H)  AST 15 - 41 U/L - - 2,068(H)  ALT 0 - 44 U/L - - 1,221(H)    CBC Latest Ref Rng & Units 2020/07/20 July 20, 2020 07/12/2020  WBC 4.0 - 10.5 K/uL - 33.0(H) 25.8(H)  Hemoglobin 12.0 - 15.0 g/dL 8.5(L) 8.6(L) 9.1(L)  Hematocrit 36 - 46 % 25.0(L) 29.6(L) 29.4(L)  Platelets 150 - 400 K/uL - 16(LL) 24(LL)    ABG    Component Value Date/Time   PHART 7.092 (LL) Jul 20, 2020 0606   PCO2ART 23.9 (L) Jul 20, 2020 0606   PO2ART 118 (H) 20-Jul-2020 0606   HCO3 7.3 (L) 20-Jul-2020 0606   TCO2 8 (L) 07-20-20 0606   ACIDBASEDEF 21.0 (H) 2020/07/20 0606   O2SAT 97.0 07-20-2020 0606    CBG (last 3)  Recent Labs    07/12/20 1947 07/12/20 2334 July 20, 2020 0331  GLUCAP 140* 101* 111*    Critical care time: 37 minutes   Chesley Mires, MD [place] Pager - [phone] 20-Jul-2020, 8:48 AM

## 2020-07-18 NOTE — Progress Notes (Signed)
[facility] Progress Note Patient Name: Sarah Harmon DOB: 27-Jan-1946 MRN: [id number]   Date of Service  07/25/2020  HPI/Events of Note  ABG on 40%/PRVC 20 /TV 360/P 8 = 7.092/23.9/118. No room to correct acidosis with ventilator as pCO2 already = 23.9.  eICU Interventions  Plan: 1. NaHCO3 200 units IV now.  2. NaHCO3 IV infusion to run at 100 mL/hour.  3. Repeat ABG at 10 AM.     Intervention Category Major Interventions: Acid-Base disturbance - evaluation and management;Respiratory failure - evaluation and management  Reginia Battie Cornelia Copa 25-Jul-2020, 6:21 AM

## 2020-07-18 DEATH — deceased

## 2020-07-28 ENCOUNTER — Ambulatory Visit: Payer: Medicare Other

## 2021-02-17 ENCOUNTER — Ambulatory Visit: Payer: Medicare Other | Admitting: Hematology

## 2021-02-17 ENCOUNTER — Other Ambulatory Visit: Payer: Medicare Other
